# Patient Record
Sex: Female | Born: 1945 | Race: White | Hispanic: No | Smoking: Former smoker
Health system: Southern US, Community
[De-identification: ages and names within clinical notes are randomized; demographics above are authoritative.]

## PROBLEM LIST (undated history)

## (undated) DIAGNOSIS — M1990 Unspecified osteoarthritis, unspecified site: Secondary | ICD-10-CM

## (undated) DIAGNOSIS — E079 Disorder of thyroid, unspecified: Secondary | ICD-10-CM

## (undated) DIAGNOSIS — I82409 Acute embolism and thrombosis of unspecified deep veins of unspecified lower extremity: Secondary | ICD-10-CM

## (undated) DIAGNOSIS — E039 Hypothyroidism, unspecified: Secondary | ICD-10-CM

## (undated) DIAGNOSIS — I10 Essential (primary) hypertension: Secondary | ICD-10-CM

## (undated) DIAGNOSIS — J449 Chronic obstructive pulmonary disease, unspecified: Secondary | ICD-10-CM

## (undated) DIAGNOSIS — E7800 Pure hypercholesterolemia, unspecified: Secondary | ICD-10-CM

## (undated) DIAGNOSIS — K219 Gastro-esophageal reflux disease without esophagitis: Secondary | ICD-10-CM

## (undated) HISTORY — PX: VEIN SURGERY: SHX48

## (undated) HISTORY — PX: TONSILLECTOMY: SUR1361

## (undated) HISTORY — PX: BACK SURGERY: SHX140

## (undated) HISTORY — PX: TUBAL LIGATION: SHX77

---

## 2006-05-16 ENCOUNTER — Ambulatory Visit: Payer: Self-pay | Admitting: Cardiology

## 2008-12-20 ENCOUNTER — Ambulatory Visit: Payer: Self-pay | Admitting: Cardiology

## 2009-05-04 ENCOUNTER — Ambulatory Visit: Payer: Self-pay | Admitting: Cardiology

## 2011-12-04 DIAGNOSIS — A048 Other specified bacterial intestinal infections: Secondary | ICD-10-CM

## 2011-12-04 HISTORY — DX: Other specified bacterial intestinal infections: A04.8

## 2012-04-03 ENCOUNTER — Emergency Department (HOSPITAL_COMMUNITY): Payer: Medicare Other

## 2012-04-03 ENCOUNTER — Encounter (HOSPITAL_COMMUNITY): Payer: Self-pay | Admitting: Emergency Medicine

## 2012-04-03 ENCOUNTER — Inpatient Hospital Stay (HOSPITAL_COMMUNITY)
Admission: EM | Admit: 2012-04-03 | Discharge: 2012-04-05 | DRG: 440 | Disposition: A | Discharge status: Home / Self Care | Payer: Medicare Other | Attending: Internal Medicine | Admitting: Internal Medicine

## 2012-04-03 DIAGNOSIS — M129 Arthropathy, unspecified: Secondary | ICD-10-CM | POA: Diagnosis present

## 2012-04-03 DIAGNOSIS — E7800 Pure hypercholesterolemia, unspecified: Secondary | ICD-10-CM | POA: Diagnosis present

## 2012-04-03 DIAGNOSIS — J4489 Other specified chronic obstructive pulmonary disease: Secondary | ICD-10-CM | POA: Diagnosis present

## 2012-04-03 DIAGNOSIS — K219 Gastro-esophageal reflux disease without esophagitis: Secondary | ICD-10-CM | POA: Diagnosis present

## 2012-04-03 DIAGNOSIS — R1013 Epigastric pain: Secondary | ICD-10-CM | POA: Diagnosis present

## 2012-04-03 DIAGNOSIS — J449 Chronic obstructive pulmonary disease, unspecified: Secondary | ICD-10-CM | POA: Diagnosis present

## 2012-04-03 DIAGNOSIS — K2980 Duodenitis without bleeding: Secondary | ICD-10-CM | POA: Diagnosis present

## 2012-04-03 DIAGNOSIS — K2970 Gastritis, unspecified, without bleeding: Secondary | ICD-10-CM | POA: Diagnosis present

## 2012-04-03 DIAGNOSIS — I10 Essential (primary) hypertension: Secondary | ICD-10-CM

## 2012-04-03 DIAGNOSIS — K2990 Gastroduodenitis, unspecified, without bleeding: Secondary | ICD-10-CM | POA: Diagnosis present

## 2012-04-03 DIAGNOSIS — K859 Acute pancreatitis without necrosis or infection, unspecified: Principal | ICD-10-CM | POA: Diagnosis present

## 2012-04-03 DIAGNOSIS — E039 Hypothyroidism, unspecified: Secondary | ICD-10-CM | POA: Diagnosis present

## 2012-04-03 DIAGNOSIS — E119 Type 2 diabetes mellitus without complications: Secondary | ICD-10-CM | POA: Diagnosis present

## 2012-04-03 DIAGNOSIS — R112 Nausea with vomiting, unspecified: Secondary | ICD-10-CM | POA: Diagnosis present

## 2012-04-03 DIAGNOSIS — E782 Mixed hyperlipidemia: Secondary | ICD-10-CM | POA: Diagnosis present

## 2012-04-03 DIAGNOSIS — E785 Hyperlipidemia, unspecified: Secondary | ICD-10-CM

## 2012-04-03 DIAGNOSIS — F17200 Nicotine dependence, unspecified, uncomplicated: Secondary | ICD-10-CM | POA: Diagnosis present

## 2012-04-03 DIAGNOSIS — Z794 Long term (current) use of insulin: Secondary | ICD-10-CM

## 2012-04-03 HISTORY — DX: Pure hypercholesterolemia, unspecified: E78.00

## 2012-04-03 HISTORY — DX: Disorder of thyroid, unspecified: E07.9

## 2012-04-03 HISTORY — DX: Chronic obstructive pulmonary disease, unspecified: J44.9

## 2012-04-03 HISTORY — DX: Acute embolism and thrombosis of unspecified deep veins of unspecified lower extremity: I82.409

## 2012-04-03 HISTORY — DX: Essential (primary) hypertension: I10

## 2012-04-03 HISTORY — DX: Unspecified osteoarthritis, unspecified site: M19.90

## 2012-04-03 HISTORY — DX: Acute pancreatitis without necrosis or infection, unspecified: K85.90

## 2012-04-03 LAB — COMPREHENSIVE METABOLIC PANEL
ALT: 13 U/L (ref 0–35)
Alkaline Phosphatase: 61 U/L (ref 39–117)
BUN: 14 mg/dL (ref 6–23)
CO2: 23 mEq/L (ref 19–32)
Chloride: 100 mEq/L (ref 96–112)
GFR calc Af Amer: >90 mL/min (ref >90)
GFR calc non Af Amer: 88 mL/min — ABNORMAL LOW (ref >90)
Glucose, Bld: 116 mg/dL — ABNORMAL HIGH (ref 70–99)
Potassium: 3.6 mEq/L (ref 3.5–5.1)
Sodium: 137 mEq/L (ref 135–145)
Total Bilirubin: 0.3 mg/dL (ref 0.3–1.2)
Total Protein: 7.1 g/dL (ref 6.0–8.3)

## 2012-04-03 LAB — CBC
HCT: 39.2 % (ref 36.0–46.0)
Hemoglobin: 13.4 g/dL (ref 12.0–15.0)
MCH: 31 pg (ref 26.0–34.0)
MCHC: 34.2 g/dL (ref 30.0–36.0)
RDW: 12.2 % (ref 11.5–15.5)

## 2012-04-03 LAB — DIFFERENTIAL
Basophils Relative: 0 % (ref 0–1)
Eosinophils Relative: 0 % (ref 0–5)
Monocytes Absolute: 0.7 10*3/uL (ref 0.1–1.0)
Monocytes Relative: 9 % (ref 3–12)
Neutro Abs: 4.6 10*3/uL (ref 1.7–7.7)

## 2012-04-03 LAB — URINALYSIS, ROUTINE W REFLEX MICROSCOPIC
Bilirubin Urine: NEGATIVE
Ketones, ur: NEGATIVE mg/dL
Nitrite: NEGATIVE
Protein, ur: NEGATIVE mg/dL
pH: 6 (ref 5.0–8.0)

## 2012-04-03 LAB — TROPONIN I: Troponin I: <0.3 ng/mL (ref <0.30)

## 2012-04-03 MED ORDER — HYDROMORPHONE HCL PF 1 MG/ML IJ SOLN
1.0000 mg | INTRAMUSCULAR | Status: DC | PRN
  Administered 2012-04-04 (×2): 1 mg via INTRAVENOUS
  Filled 2012-04-03 (×2): qty 1

## 2012-04-03 MED ORDER — HYDROMORPHONE HCL PF 1 MG/ML IJ SOLN
1.0000 mg | Freq: Once | INTRAMUSCULAR | Status: AC
  Administered 2012-04-03: 1 mg via INTRAVENOUS

## 2012-04-03 MED ORDER — BIOTENE DRY MOUTH MT LIQD
15.0000 mL | Freq: Two times a day (BID) | OROMUCOSAL | Status: DC
  Administered 2012-04-03 – 2012-04-05 (×4): 15 mL via OROMUCOSAL

## 2012-04-03 MED ORDER — ACETAMINOPHEN 325 MG PO TABS: 650.0000 mg | ORAL_TABLET | Freq: Four times a day (QID) | ORAL | Status: DC | PRN

## 2012-04-03 MED ORDER — SENNOSIDES-DOCUSATE SODIUM 8.6-50 MG PO TABS: 1.0000 | ORAL_TABLET | Freq: Every evening | ORAL | Status: DC | PRN

## 2012-04-03 MED ORDER — ALPRAZOLAM 0.5 MG PO TABS
0.5000 mg | ORAL_TABLET | Freq: Two times a day (BID) | ORAL | Status: DC
  Administered 2012-04-03 – 2012-04-04 (×2): 0.5 mg via ORAL
  Filled 2012-04-03 (×2): qty 1

## 2012-04-03 MED ORDER — SODIUM CHLORIDE 0.9 % IV SOLN
INTRAVENOUS | Status: AC
  Administered 2012-04-03: 19:00:00 via INTRAVENOUS

## 2012-04-03 MED ORDER — LEVOTHYROXINE SODIUM 75 MCG PO TABS
150.0000 ug | ORAL_TABLET | Freq: Every day | ORAL | Status: DC
  Administered 2012-04-04 – 2012-04-05 (×2): 150 ug via ORAL
  Filled 2012-04-03 (×4): qty 1

## 2012-04-03 MED ORDER — INSULIN ASPART 100 UNIT/ML ~~LOC~~ SOLN
0.0000 [IU] | Freq: Three times a day (TID) | SUBCUTANEOUS | Status: DC
  Administered 2012-04-04 – 2012-04-05 (×2): 1 [IU] via SUBCUTANEOUS
  Administered 2012-04-05: 2 [IU] via SUBCUTANEOUS

## 2012-04-03 MED ORDER — ONDANSETRON HCL 4 MG/2ML IJ SOLN
4.0000 mg | Freq: Four times a day (QID) | INTRAMUSCULAR | Status: DC | PRN
  Administered 2012-04-04: 4 mg via INTRAVENOUS
  Filled 2012-04-03 (×2): qty 2

## 2012-04-03 MED ORDER — SODIUM CHLORIDE 0.9 % IV SOLN
INTRAVENOUS | Status: DC
  Administered 2012-04-03: 1000 mL via INTRAVENOUS

## 2012-04-03 MED ORDER — POTASSIUM CHLORIDE IN NACL 20-0.9 MEQ/L-% IV SOLN
INTRAVENOUS | Status: DC
  Administered 2012-04-03 – 2012-04-04 (×2): via INTRAVENOUS
  Administered 2012-04-04: 1000 mL via INTRAVENOUS
  Administered 2012-04-05: 12:00:00 via INTRAVENOUS

## 2012-04-03 MED ORDER — PNEUMOCOCCAL VAC POLYVALENT 25 MCG/0.5ML IJ INJ
0.5000 mL | INJECTION | INTRAMUSCULAR | Status: AC
  Administered 2012-04-04: 0.5 mL via INTRAMUSCULAR
  Filled 2012-04-03: qty 0.5

## 2012-04-03 MED ORDER — HYDROMORPHONE HCL PF 1 MG/ML IJ SOLN
1.0000 mg | Freq: Once | INTRAMUSCULAR | Status: DC
  Filled 2012-04-03: qty 1

## 2012-04-03 MED ORDER — ONDANSETRON HCL 4 MG/2ML IJ SOLN
4.0000 mg | Freq: Once | INTRAMUSCULAR | Status: AC
  Administered 2012-04-03: 4 mg via INTRAVENOUS
  Filled 2012-04-03: qty 2

## 2012-04-03 MED ORDER — ONDANSETRON HCL 4 MG/2ML IJ SOLN
4.0000 mg | Freq: Once | INTRAMUSCULAR | Status: DC
  Filled 2012-04-03: qty 2

## 2012-04-03 MED ORDER — ONDANSETRON HCL 4 MG PO TABS
4.0000 mg | ORAL_TABLET | Freq: Four times a day (QID) | ORAL | Status: DC | PRN
  Administered 2012-04-04: 4 mg via ORAL
  Filled 2012-04-03: qty 1

## 2012-04-03 MED ORDER — PANTOPRAZOLE SODIUM 40 MG IV SOLR
40.0000 mg | Freq: Every day | INTRAVENOUS | Status: DC
  Administered 2012-04-03: 40 mg via INTRAVENOUS
  Filled 2012-04-03: qty 40

## 2012-04-03 MED ORDER — ACETAMINOPHEN 650 MG RE SUPP: 650.0000 mg | Freq: Four times a day (QID) | RECTAL | Status: DC | PRN

## 2012-04-03 MED ORDER — DOCUSATE SODIUM 100 MG PO CAPS
100.0000 mg | ORAL_CAPSULE | Freq: Two times a day (BID) | ORAL | Status: DC
  Administered 2012-04-03 – 2012-04-05 (×4): 100 mg via ORAL
  Filled 2012-04-03 (×4): qty 1

## 2012-04-03 MED ORDER — ONDANSETRON HCL 4 MG/2ML IJ SOLN
4.0000 mg | Freq: Once | INTRAMUSCULAR | Status: AC
  Administered 2012-04-03: 4 mg via INTRAVENOUS

## 2012-04-03 MED ORDER — HYDROMORPHONE HCL PF 1 MG/ML IJ SOLN
1.0000 mg | Freq: Once | INTRAMUSCULAR | Status: AC
  Administered 2012-04-03: 1 mg via INTRAVENOUS
  Filled 2012-04-03: qty 1

## 2012-04-03 NOTE — ED Notes (Signed)
Pt c/o ruq pain with n/v x 1 week.

## 2012-04-03 NOTE — H&P (Signed)
Hospital Admission Note Date: 04/03/2012  Patient name: Gabrielle Wood Medical record number: [id number] Date of birth: 15-Sep-1946 Age: 66 y.o. Gender: female PCP: Tamela Oddi  Attending physician: Christiane Ha, MD  Chief Complaint: abdominal pain and nausea  History of Present Illness:  Gabrielle Wood is an 66 y.o. female who presents with worsening epigastric pain radiating to both sides of her rib cage and to the back. She has had intermittent and worsening nausea for several months now. She was diagnosed with diabetes 2 months ago and since then has had much vomiting. She has been tried on many diabetic medications, including metformin, Genevia, insulin, and others. She does not drink alcohol. She reports similar symptoms intermittently in the past but not this severe or prolonged. She has lost 15 or 20 pounds since her diagnosis of diabetes. She reports having had 2 CAT scans at [facility]. These results are unavailable. According to the ED physician, who spoke with her primary care physician, CAT scans showed nothing concerning. Apparently, she was scheduled to have a hida scan tomorrow. She reports a history of gastroesophageal reflux disease and takes Prilosec intermittently. She reports having had 3 EGDs for various GI complaints. She is seeing Dr. Dionicia Abler in the past and he has done endoscopy on 2 separate occasions. She's had no fevers. She feels cold chronically. No hematemesis. No diarrhea. In the ER, workup was significant for a mildly elevated lipase. Her liver function tests were normal. Her pain has been 10 out of 10 previously. After receiving medication in the ER, she feels better than she has in months, but is not pain-free at this time.  Past Medical History  Diagnosis Date  . Diabetes mellitus   . Hypertension   . Thyroid disease   . Arthritis   . COPD (chronic obstructive pulmonary disease)   . High cholesterol     Meds: Prescriptions prior to admission    Medication Sig Dispense Refill  . ALPRAZolam (XANAX) 0.5 MG tablet Take 0.5 mg by mouth 2 (two) times daily.      . Biotin 1000 MCG tablet Take 1,000 mcg by mouth daily.      . ciprofloxacin (CIPRO) 500 MG tablet Take 250 mg by mouth 2 (two) times daily.      [name] dimenhyDRINATE (DRAMAMINE) 50 MG tablet Take 50 mg by mouth every 4 (four) hours as needed. For nausea      . fish oil-omega-3 fatty acids 1000 MG capsule Take 1 g by mouth daily.      [name] HYDROcodone-acetaminophen (LORTAB) 10-500 MG per tablet Take 1 tablet by mouth every 6 (six) hours as needed. For pain      . ibuprofen (ADVIL,MOTRIN) 800 MG tablet Take 800 mg by mouth daily as needed. For pain      . insulin aspart (NOVOLOG FLEXPEN) 100 UNIT/ML injection Inject 10-20 Units into the skin 2 (two) times daily. Inject 20 units subcutaneously every day in the morning and 10 units every day in the evening      . levothyroxine (SYNTHROID, LEVOTHROID) 150 MCG tablet Take 150 mcg by mouth daily.      . metFORMIN (GLUCOPHAGE) 1000 MG tablet Take 1,000 mg by mouth 2 (two) times daily.      . Multiple Vitamin (MULITIVITAMIN WITH MINERALS) TABS Take 1 tablet by mouth daily.      . ondansetron (ZOFRAN) 4 MG tablet Take 4 mg by mouth every 8 (eight) hours as needed. For nausea      .  rosuvastatin (CRESTOR) 20 MG tablet Take 20 mg by mouth daily.        Allergies: Lipitor and Red yeast rice History   Social History  . Marital Status: Widowed    Spouse Name: N/A    Number of Children: N/A  . Years of Education: N/A   Occupational History  . Not on file.   Social History Main Topics  . Smoking status: Current Some Day Smoker  . Smokeless tobacco: Not on file  . Alcohol Use: No  . Drug Use: No  . Sexually Active:    Other Topics Concern  . Not on file   Social History Narrative  . No narrative on file   Family history: Mother died of pancreatic cancer. Niece recently died of colon cancer. Strokes diabetes hypertension  Past  Surgical History  Procedure Date  . Back surgery   . Tonsillectomy     Review of Systems: Systems reviewed and as per HPI, otherwise negative.  Physical Exam: Blood pressure 125/78, pulse 72, temperature 98.2 F (36.8 C), temperature source Oral, resp. rate 20, height 5\' 7"  (1.702 m), weight 83.553 kg (184 lb 3.2 oz), SpO2 96.00%. BP 110/68  Pulse 84  Temp(Src) 98.1 F (36.7 C) (Oral)  Resp 20  Ht 5\' 7"  (1.702 m)  Wt 83.553 kg (184 lb 3.2 oz)  BMI 28.85 kg/m2  SpO2 93%  General Appearance:    Alert, cooperative, no distress, appears stated age  Head:    Normocephalic, without obvious abnormality, atraumatic  Eyes:    PERRL, conjunctiva/corneas clear, EOM's intact, fundi    benign, both eyes     Nose:   Nares normal, septum midline, mucosa normal, no drainage    or sinus tenderness  Throat:   Lips, mucosa, and tongue normal; teeth and gums normal  Neck:   Supple, symmetrical, trachea midline, no adenopathy;    thyroid:  no enlargement/tenderness/nodules; no carotid   bruit or JVD  Back:     Symmetric, no curvature, ROM normal, no CVA tenderness  Lungs:     Clear to auscultation bilaterally, respirations unlabored  Chest Wall:    No tenderness or deformity   Heart:    Regular rate and rhythm, S1 and S2 normal, no murmur, rub   or gallop  Breast Exam:    No tenderness, masses, or nipple abnormality  Abdomen:     Soft, non-tender, bowel sounds active all four quadrants,    no masses, no organomegaly  Genitalia:    Normal female without lesion, discharge or tenderness  Rectal:    Normal tone, normal prostate, no masses or tenderness;   guaiac negative stool  Extremities:   Extremities normal, atraumatic, no cyanosis or edema  Pulses:   2+ and symmetric all extremities  Skin:   Skin color, texture, turgor normal, no rashes or lesions  Lymph nodes:   Cervical, supraclavicular, and axillary nodes normal  Neurologic:   CNII-XII intact, normal strength, sensation and reflexes     throughout    Lab results: Basic Metabolic Panel:  Basename 04/03/12 1509  NA 137  K 3.6  CL 100  CO2 23  GLUCOSE 116*  BUN 14  CREATININE 0.71  CALCIUM 10.4  MG --  PHOS --   Liver Function Tests:  Basename 04/03/12 1509  AST 13  ALT 13  ALKPHOS 61  BILITOT 0.3  PROT 7.1  ALBUMIN 4.0    Basename 04/03/12 1509  LIPASE 121*  AMYLASE --   No results  found for this basename: AMMONIA:2 in the last 72 hours CBC:  Basename 04/03/12 1509  WBC 7.2  NEUTROABS 4.6  HGB 13.4  HCT 39.2  MCV 90.7  PLT 172   Cardiac Enzymes:  Basename 04/03/12 1530  CKTOTAL --  CKMB --  CKMBINDEX --  TROPONINI <0.30   BNP: No results found for this basename: PROBNP:3 in the last 72 hours D-Dimer: No results found for this basename: DDIMER:2 in the last 72 hours CBG:  Basename 04/03/12 2145  GLUCAP 170*   Hemoglobin A1C: No results found for this basename: HGBA1C in the last 72 hours Fasting Lipid Panel: No results found for this basename: CHOL,HDL,LDLCALC,TRIG,CHOLHDL,LDLDIRECT in the last 72 hours Thyroid Function Tests: No results found for this basename: TSH,T4TOTAL,FREET4,T3FREE,THYROIDAB in the last 72 hours Anemia Panel: No results found for this basename: VITAMINB12,FOLATE,FERRITIN,TIBC,IRON,RETICCTPCT in the last 72 hours Coagulation: No results found for this basename: LABPROT:2,INR:2 in the last 72 hours Urine Drug Screen: Drugs of Abuse  No results found for this basename: labopia, cocainscrnur, labbenz, amphetmu, thcu, labbarb    Alcohol Level: No results found for this basename: ETH:2 in the last 72 hours Urinalysis:  Basename 04/03/12 2144  COLORURINE YELLOW  LABSPEC 1.010  PHURINE 6.0  GLUCOSEU NEGATIVE  HGBUR NEGATIVE  BILIRUBINUR NEGATIVE  KETONESUR NEGATIVE  PROTEINUR NEGATIVE  UROBILINOGEN 0.2  NITRITE NEGATIVE  LEUKOCYTESUR NEGATIVE    Imaging results:  US Abdomen Complete  04/03/2012  *RADIOLOGY REPORT*  Clinical Data:  Pancreatitis,  right upper quadrant abdominal pain  COMPLETE ABDOMINAL ULTRASOUND  Comparison:  03/03/2012; CT abdomen pelvis - 03/31/2012  Findings:  Gallbladder:  Sonographically normal.  The gallbladder wall thickening.  No echogenic gallstones.  No pericholecystic fluid. Negative sonographic Murphy's sign.  Common bile duct:  Normal in size measuring 6.5 mm in diameter.  Liver:  Homogeneous hepatic and texture.  No discrete hepatic lesions.  No definite intrahepatic biliary ductal dilatation.  No ascites.  IVC:  Appears normal.  Pancreas:  Limited visualization of the pancreatic head, neck and proximal body is normal.  The pancreatic body and tail is obscured by bowel gas. No definite pancreatic ductal dilatation.  Spleen:  Normal, measuring 7.9 mm in length.  Right Kidney:  Normal cortical thickness, echogenicity and size, measuring 10.6 cm in length.  Unchanged approximately 2.7 x 2.9 x 2.6 cm partially exophytic anechoic cyst arising from the superior pole right kidney.  Known left-sided renal stone is not well suspected on this examination.  No urinary obstruction.  Left Kidney:  Normal cortical thickness, echogenicity and size, measuring 11.2 cm in length.  No focal renal lesions.  No echogenic renal stones.  No urinary obstruction.  Abdominal aorta:  No aneurysm identified, though note the distal aspect of the abdominal aorta is obscured by bowel gas.  IMPRESSION:  1.  No explanation for patient's right upper quadrant abdominal pain.  Specifically, no evidence of cholelithiasis. Limited visualization of the pancreatic head, neck and proximal body is normal.  2.  Known left-sided nonobstructing renal stone is not well depicted on this examination.  No urinary obstruction.  3. Unchanged right-sided partially exophytic renal cyst.  Original Report Authenticated By: Waynard Reeds, M.D.    Assessment & Plan: Principal Problem:  *Pancreatitis, acute: Patient's symptoms have been prolonged, and she may have had a  smoldering pancreatitis for some time. With her recent diagnosis of diabetes, I wonder whether this was related to one of her medications. She denies alcohol use, her liver function  tests are unremarkable, and ultrasound shows no cholelithiasis. She is known to Dr. Dionicia Abler and I will consult him for further recommendations. I will asked her radiology reports from [facility]. She may need MRCP. Her symptoms are slightly improved. I will start her on clear liquid diet, IV fluids, pain medication, nausea medication. Hold most of her oral medications.   DM type 2 (diabetes mellitus, type 2): Will give sliding scale insulin.  Benign hypertension  Hyperlipidemia  Hypothyroidism  GERD (gastroesophageal reflux disease): IV Protonix   Royalty Fakhouri L 04/03/2012, 10:06 PM

## 2012-04-03 NOTE — ED Notes (Signed)
Tried to call report, Shanda Bumps RN to call me back.

## 2012-04-03 NOTE — ED Provider Notes (Addendum)
History     CSN: [id number]  Arrival date & time 04/03/12  1430   None     Chief Complaint  Patient presents with  . Emesis  . Abdominal Pain    (Consider location/radiation/quality/duration/timing/severity/associated sxs/prior treatment) HPI Complains of bilateral flank pain radiating to epigastric area and bilateral quadrants onset 7 or 8 days ago pain is constant , not affected by eating she has been treated with hydrocodone with partial relief symptoms accompanied by nausea patient vomited clear material 2 times this morning no fever pain is worse with walking no chest pain no shortness of breath no fever last bowel movement this morning normal. Patient evaluated by Dr.Kevia last week for same complaint had CT scan of abdomen which was normal. Presently symptoms are mild no other associated symptoms Past Medical History  Diagnosis Date  . Diabetes mellitus   . Hypertension   . Thyroid disease   . Arthritis   . COPD (chronic obstructive pulmonary disease)   . High cholesterol     Past Surgical History  Procedure Date  . Back surgery   . Tonsillectomy     No family history on file.  History  Substance Use Topics  . Smoking status: Current Some Day Smoker  . Smokeless tobacco: Not on file  . Alcohol Use: No    OB History    Grav Para Term Preterm Abortions TAB SAB Ect Mult Living                  Review of Systems  Constitutional: Negative.   HENT: Negative.   Respiratory: Negative.   Cardiovascular: Negative.   Gastrointestinal: Positive for nausea, vomiting and abdominal pain.  Genitourinary: Positive for flank pain.  Musculoskeletal: Negative.   Skin: Negative.   Neurological: Negative.   Hematological: Negative.   Psychiatric/Behavioral: Negative.   All other systems reviewed and are negative.    Allergies  Lipitor and Red yeast rice  Home Medications  No current outpatient prescriptions on file.  BP 143/76  Pulse 94  Temp 97.8 F (36.6 C)   Resp 18  Ht 5\' 7"  (1.702 m)  Wt 184 lb (83.462 kg)  BMI 28.82 kg/m2  SpO2 97%  Physical Exam  Nursing note and vitals reviewed. Constitutional: She appears well-developed and well-nourished.  HENT:  Head: Normocephalic and atraumatic.  Eyes: Conjunctivae are normal. Pupils are equal, round, and reactive to light.  Neck: Neck supple. No tracheal deviation present. No thyromegaly present.  Cardiovascular: Normal rate and regular rhythm.   No murmur heard. Pulmonary/Chest: Effort normal and breath sounds normal.  Abdominal: Soft. Bowel sounds are normal. She exhibits no distension and no mass. There is tenderness. There is no rebound and no guarding.       Obese Mild epigastric tenderness mild right upper quadrant tenderness and epigastric tenderness no Murphy sign  Genitourinary:       Mild bilateral flank tenderness  Musculoskeletal: Normal range of motion. She exhibits no edema and no tenderness.  Neurological: She is alert. Coordination normal.  Skin: Skin is warm and dry. No rash noted.  Psychiatric: She has a normal mood and affect.    ED Course  Procedures (including critical care time)  Labs Reviewed - No data to display No results found.   No diagnosis found.   Date: 04/03/2012  Rate: 85  Rhythm: normal sinus rhythm  QRS Axis: normal  Intervals: normal  ST/T Wave abnormalities: normal  Conduction Disutrbances: none  Narrative Interpretation: unremarkable  no  prior EKG for comparison Results for orders placed during the hospital encounter of 04/03/12  COMPREHENSIVE METABOLIC PANEL      Component Value Range   Sodium 137  135 - 145 (mEq/L)   Potassium 3.6  3.5 - 5.1 (mEq/L)   Chloride 100  96 - 112 (mEq/L)   CO2 23  19 - 32 (mEq/L)   Glucose, Bld 116 (*) 70 - 99 (mg/dL)   BUN 14  6 - 23 (mg/dL)   Creatinine, Ser 5.78  0.50 - 1.10 (mg/dL)   Calcium 46.9  8.4 - 10.5 (mg/dL)   Total Protein 7.1  6.0 - 8.3 (g/dL)   Albumin 4.0  3.5 - 5.2 (g/dL)   AST 13  0 -  37 (U/L)   ALT 13  0 - 35 (U/L)   Alkaline Phosphatase 61  39 - 117 (U/L)   Total Bilirubin 0.3  0.3 - 1.2 (mg/dL)   GFR calc non Af Amer 88 (*) >90 (mL/min)   GFR calc Af Amer >90  >90 (mL/min)  LIPASE, BLOOD      Component Value Range   Lipase 121 (*) 11 - 59 (U/L)  DIFFERENTIAL      Component Value Range   Neutrophils Relative 64  43 - 77 (%)   Neutro Abs 4.6  1.7 - 7.7 (K/uL)   Lymphocytes Relative 26  12 - 46 (%)   Lymphs Abs 1.9  0.7 - 4.0 (K/uL)   Monocytes Relative 9  3 - 12 (%)   Monocytes Absolute 0.7  0.1 - 1.0 (K/uL)   Eosinophils Relative 0  0 - 5 (%)   Eosinophils Absolute 0.0  0.0 - 0.7 (K/uL)   Basophils Relative 0  0 - 1 (%)   Basophils Absolute 0.0  0.0 - 0.1 (K/uL)  CBC      Component Value Range   WBC 7.2  4.0 - 10.5 (K/uL)   RBC 4.32  3.87 - 5.11 (MIL/uL)   Hemoglobin 13.4  12.0 - 15.0 (g/dL)   HCT 62.9  52.8 - 41.3 (%)   MCV 90.7  78.0 - 100.0 (fL)   MCH 31.0  26.0 - 34.0 (pg)   MCHC 34.2  30.0 - 36.0 (g/dL)   RDW 24.4  01.0 - 27.2 (%)   Platelets 172  150 - 400 (K/uL)  TROPONIN I      Component Value Range   Troponin I <0.30  <0.30 (ng/mL)   No results found.   Patient initially declined pain medication and antiemetics however 1640 p.m. requested pain medicine and antiemetic. Dilaudid and Zofran ordered  516 pm pain and nausea not improved after treatment with iv zofran and dialudid.  Dilaudid and zofran re-ordered.  MDM  Spoke with Dr.Kevia no further workup needed patient has HIDA scan scheduled for tomorrow as outpatient.  Spoke with Dr.Sullivan who will arrange for admission Plan bowel rest., Intravenous fluids, analgesia and antiemetics Admit medical surgical floor  Diagnosis acute pancreatitis        Doug Sou, MD 04/03/12 1710  6:08 PM feels improved after second dose of Diludid and  Zofran. Alert Glasgow Coma Score  Doug Sou, MD 04/03/12 [phone]

## 2012-04-04 ENCOUNTER — Encounter (HOSPITAL_COMMUNITY): Payer: Self-pay | Admitting: Gastroenterology

## 2012-04-04 ENCOUNTER — Encounter (HOSPITAL_COMMUNITY)
Admission: EM | Disposition: A | Discharge status: Home / Self Care | Payer: Self-pay | Source: Home / Self Care | Attending: Internal Medicine

## 2012-04-04 DIAGNOSIS — K859 Acute pancreatitis without necrosis or infection, unspecified: Secondary | ICD-10-CM

## 2012-04-04 DIAGNOSIS — E119 Type 2 diabetes mellitus without complications: Secondary | ICD-10-CM

## 2012-04-04 DIAGNOSIS — K2990 Gastroduodenitis, unspecified, without bleeding: Secondary | ICD-10-CM

## 2012-04-04 DIAGNOSIS — R748 Abnormal levels of other serum enzymes: Secondary | ICD-10-CM

## 2012-04-04 DIAGNOSIS — K2980 Duodenitis without bleeding: Secondary | ICD-10-CM

## 2012-04-04 DIAGNOSIS — E785 Hyperlipidemia, unspecified: Secondary | ICD-10-CM

## 2012-04-04 DIAGNOSIS — R109 Unspecified abdominal pain: Secondary | ICD-10-CM

## 2012-04-04 DIAGNOSIS — R1013 Epigastric pain: Secondary | ICD-10-CM

## 2012-04-04 DIAGNOSIS — I10 Essential (primary) hypertension: Secondary | ICD-10-CM

## 2012-04-04 DIAGNOSIS — K2970 Gastritis, unspecified, without bleeding: Secondary | ICD-10-CM

## 2012-04-04 DIAGNOSIS — R112 Nausea with vomiting, unspecified: Secondary | ICD-10-CM

## 2012-04-04 HISTORY — PX: ESOPHAGOGASTRODUODENOSCOPY: SHX5428

## 2012-04-04 LAB — COMPREHENSIVE METABOLIC PANEL
ALT: 9 U/L (ref 0–35)
Alkaline Phosphatase: 52 U/L (ref 39–117)
CO2: 24 mEq/L (ref 19–32)
Calcium: 9.1 mg/dL (ref 8.4–10.5)
Chloride: 106 mEq/L (ref 96–112)
GFR calc Af Amer: >90 mL/min (ref >90)
GFR calc non Af Amer: >90 mL/min (ref >90)
Glucose, Bld: 132 mg/dL — ABNORMAL HIGH (ref 70–99)
Potassium: 4 mEq/L (ref 3.5–5.1)
Sodium: 141 mEq/L (ref 135–145)
Total Bilirubin: 0.3 mg/dL (ref 0.3–1.2)

## 2012-04-04 LAB — GLUCOSE, CAPILLARY: Glucose-Capillary: 131 mg/dL — ABNORMAL HIGH (ref 70–99)

## 2012-04-04 LAB — TSH: TSH: 4.28 u[IU]/mL (ref 0.350–4.500)

## 2012-04-04 SURGERY — EGD (ESOPHAGOGASTRODUODENOSCOPY)
Anesthesia: Moderate Sedation

## 2012-04-04 MED ORDER — MEPERIDINE HCL 100 MG/ML IJ SOLN
INTRAMUSCULAR | Status: AC
  Filled 2012-04-04: qty 1

## 2012-04-04 MED ORDER — SODIUM CHLORIDE 0.9 % IV SOLN: Freq: Once | INTRAVENOUS | Status: DC

## 2012-04-04 MED ORDER — ALPRAZOLAM 0.5 MG PO TABS
0.5000 mg | ORAL_TABLET | Freq: Two times a day (BID) | ORAL | Status: DC | PRN
  Filled 2012-04-04: qty 1

## 2012-04-04 MED ORDER — PROMETHAZINE HCL 25 MG/ML IJ SOLN
INTRAMUSCULAR | Status: DC | PRN
  Administered 2012-04-04: 12.5 mg via INTRAVENOUS

## 2012-04-04 MED ORDER — STERILE WATER FOR IRRIGATION IR SOLN
Status: DC | PRN
  Administered 2012-04-04: 14:00:00

## 2012-04-04 MED ORDER — BUTAMBEN-TETRACAINE-BENZOCAINE 2-2-14 % EX AERO
INHALATION_SPRAY | CUTANEOUS | Status: DC | PRN
  Administered 2012-04-04: 2 via TOPICAL

## 2012-04-04 MED ORDER — SODIUM CHLORIDE 0.45 % IV SOLN
Freq: Once | INTRAVENOUS | Status: AC
  Administered 2012-04-04: 1000 mL via INTRAVENOUS

## 2012-04-04 MED ORDER — PANTOPRAZOLE SODIUM 40 MG IV SOLR
40.0000 mg | Freq: Two times a day (BID) | INTRAVENOUS | Status: DC
  Administered 2012-04-05: 40 mg via INTRAVENOUS
  Filled 2012-04-04: qty 40

## 2012-04-04 MED ORDER — ONDANSETRON HCL 4 MG/2ML IJ SOLN
4.0000 mg | Freq: Three times a day (TID) | INTRAMUSCULAR | Status: DC
  Administered 2012-04-05 (×2): 4 mg via INTRAVENOUS
  Filled 2012-04-04: qty 2

## 2012-04-04 MED ORDER — PROMETHAZINE HCL 25 MG/ML IJ SOLN
INTRAMUSCULAR | Status: AC
  Filled 2012-04-04: qty 1

## 2012-04-04 MED ORDER — MIDAZOLAM HCL 5 MG/5ML IJ SOLN
INTRAMUSCULAR | Status: AC
  Filled 2012-04-04: qty 5

## 2012-04-04 MED ORDER — MEPERIDINE HCL 100 MG/ML IJ SOLN
INTRAMUSCULAR | Status: DC | PRN
  Administered 2012-04-04: 25 mg via INTRAVENOUS
  Administered 2012-04-04: 50 mg via INTRAVENOUS

## 2012-04-04 MED ORDER — MIDAZOLAM HCL 5 MG/5ML IJ SOLN
INTRAMUSCULAR | Status: DC | PRN
  Administered 2012-04-04 (×2): 2 mg via INTRAVENOUS

## 2012-04-04 MED ORDER — SODIUM CHLORIDE 0.9 % IJ SOLN
INTRAMUSCULAR | Status: AC
  Filled 2012-04-04: qty 10

## 2012-04-04 NOTE — Consult Note (Signed)
REVIEWED. AGREE. 

## 2012-04-04 NOTE — Plan of Care (Signed)
REFERRAL FOR OP EUS WILL BE MADE ON MON MAY 6. PT SHOULD CALL OUR OFC: [phone] IF SHE HAS NOT HEARD FROM DR. OUTLAW BY MAY 10. OPV W/ SLF IN 6 WEEKS.

## 2012-04-04 NOTE — Consult Note (Signed)
Referring Provider: Christiane Ha, MD Primary Care Physician:  Tamela Oddi Primary Gastroenterologist:  Dr. Lionel December  Reason for Consultation:  Abdominal pain, vomiting, ?pancreatitis  HPI: Gabrielle Wood is a 66 y.o. female presented to emergency department yesterday with complaints of ongoing nausea/vomiting and abdominal pain. Patient tells me that she presented to [facility] because she's had multiple studies done at [facility] without etiology of her symptoms been determined. She reports that she has had a couple of noncontrast CT scans with no significant findings. She states that they were trying to get her insurance coming to agree to a contrasted study but they were having trouble getting it covered. She reports that she had an EGD by Dr. Erskine Speed about 5 years ago which was unremarkable. This was done for other reasons. She states she had 2 EGDs by Dr. Lionel December prior to that. Last colonoscopy about 5 years ago by Dr. Erskine Speed.    Patient c/o five months of intermittent nausea. She was diagnosed with DM few months ago and she thought her symptoms were related to that.  She has been tried on several diabetic medications and currently is on metformin and insulin. She's lost about 15 or 20 pounds since she was diagnosed with diabetes. Last 10 days, symptoms have been worse, started with vomiting. She has tried multiple agents for her nausea and vomiting including zofran/pepto/dramimine/phenergan. She decided to come to the emergency department yesterday because she has not been able to eat anything for a couple of days. Yesterday bad nausea and so much abdominal pain. Increased belching recently. Generally takes Prilosec twice weekly as needed. Symptoms have also been  associated with abdominal pain. Epigastric pain radiates around both flanks and into back. No melena, brbpr, constipation, diarrhea.   Patient reports h/o chronic ibuprofen 800mg  prn but none  in past couple of weeks due to GI issues.  Yesterday in the emergency department her lipase was elevated at 121. Her CBC, LFTs were normal. Abdominal ultrasound felt through showed explanation for patient's symptoms. No evidence of cholelithiasis.    Prior to Admission medications   Medication Sig Start Date End Date Taking? Authorizing Provider  ALPRAZolam Prudy Feeler) 0.5 MG tablet Take 0.5 mg by mouth 2 (two) times daily.   Yes Historical Provider, MD  Biotin 1000 MCG tablet Take 1,000 mcg by mouth daily.   Yes Historical Provider, MD  ciprofloxacin (CIPRO) 500 MG tablet Take 250 mg by mouth 2 (two) times daily.   Yes Historical Provider, MD  dimenhyDRINATE (DRAMAMINE) 50 MG tablet Take 50 mg by mouth every 4 (four) hours as needed. For nausea   Yes Historical Provider, MD  fish oil-omega-3 fatty acids 1000 MG capsule Take 1 g by mouth daily.   Yes Historical Provider, MD  HYDROcodone-acetaminophen (LORTAB) 10-500 MG per tablet Take 1 tablet by mouth every 6 (six) hours as needed. For pain   Yes Historical Provider, MD  ibuprofen (ADVIL,MOTRIN) 800 MG tablet Take 800 mg by mouth daily as needed. For pain   Yes Historical Provider, MD  insulin aspart (NOVOLOG FLEXPEN) 100 UNIT/ML injection Inject 10-20 Units into the skin 2 (two) times daily. Inject 20 units subcutaneously every day in the morning and 10 units every day in the evening   Yes Historical Provider, MD  levothyroxine (SYNTHROID, LEVOTHROID) 150 MCG tablet Take 150 mcg by mouth daily.   Yes Historical Provider, MD  metFORMIN (GLUCOPHAGE) 1000 MG tablet Take 1,000 mg by mouth 2 (two)  times daily.   Yes Historical Provider, MD  Multiple Vitamin (MULITIVITAMIN WITH MINERALS) TABS Take 1 tablet by mouth daily.   Yes Historical Provider, MD  ondansetron (ZOFRAN) 4 MG tablet Take 4 mg by mouth every 8 (eight) hours as needed. For nausea   Yes Historical Provider, MD  rosuvastatin (CRESTOR) 20 MG tablet Take 20 mg by mouth daily.   Yes Historical  Provider, MD    Current Facility-Administered Medications  Medication Dose Route Frequency Provider Last Rate Last Dose  . 0.9 %  sodium chloride infusion   Intravenous STAT Doug Sou, MD 125 mL/hr at 04/03/12 1839    . 0.9 % NaCl with KCl 20 mEq/ L  infusion   Intravenous Continuous Christiane Ha, MD 125 mL/hr at 04/04/12 0413    . acetaminophen (TYLENOL) tablet 650 mg  650 mg Oral Q6H PRN Christiane Ha, MD       Or  . acetaminophen (TYLENOL) suppository 650 mg  650 mg Rectal Q6H PRN Christiane Ha, MD      . ALPRAZolam Prudy Feeler) tablet 0.5 mg  0.5 mg Oral BID Christiane Ha, MD   0.5 mg at 04/03/12 2123  . antiseptic oral rinse (BIOTENE) solution 15 mL  15 mL Mouth Rinse BID Christiane Ha, MD   15 mL at 04/04/12 0800  . docusate sodium (COLACE) capsule 100 mg  100 mg Oral BID Christiane Ha, MD   100 mg at 04/03/12 2123  . HYDROmorphone (DILAUDID) injection 1 mg  1 mg Intravenous Once Doug Sou, MD   1 mg at 04/03/12 1700  . HYDROmorphone (DILAUDID) injection 1 mg  1 mg Intravenous Once Doug Sou, MD   1 mg at 04/03/12 1722  . HYDROmorphone (DILAUDID) injection 1 mg  1 mg Intravenous Q3H PRN Christiane Ha, MD   1 mg at 04/04/12 0735  . insulin aspart (novoLOG) injection 0-9 Units  0-9 Units Subcutaneous TID WC Christiane Ha, MD      . levothyroxine (SYNTHROID, LEVOTHROID) tablet 150 mcg  150 mcg Oral QAC breakfast Christiane Ha, MD   150 mcg at 04/04/12 0735  . ondansetron (ZOFRAN) injection 4 mg  4 mg Intravenous Once Doug Sou, MD   4 mg at 04/03/12 1700  . ondansetron (ZOFRAN) injection 4 mg  4 mg Intravenous Once Doug Sou, MD   4 mg at 04/03/12 1722  . ondansetron (ZOFRAN) tablet 4 mg  4 mg Oral Q6H PRN Christiane Ha, MD       Or  . ondansetron [facility]) injection 4 mg  4 mg Intravenous Q6H PRN Christiane Ha, MD   4 mg at 04/04/12 0735  . pantoprazole (PROTONIX) injection 40 mg  40 mg Intravenous QHS Christiane Ha, MD   40 mg at 04/03/12 2219  . pneumococcal 23 valent vaccine (PNU-IMMUNE) injection 0.5 mL  0.5 mL Intramuscular Tomorrow-1000 Christiane Ha, MD      . senna-docusate (Senokot-S) tablet 1 tablet  1 tablet Oral QHS PRN Christiane Ha, MD      . DISCONTD: 0.9 %  sodium chloride infusion   Intravenous Continuous Doug Sou, MD 125 mL/hr at 04/03/12 1700 1,000 mL at 04/03/12 1700  . DISCONTD: HYDROmorphone (DILAUDID) injection 1 mg  1 mg Intravenous Once Doug Sou, MD      . DISCONTD: ondansetron (ZOFRAN) injection 4 mg  4 mg Intravenous Once Doug Sou, MD        Allergies as  of 04/03/2012 - Review Complete 04/03/2012  Allergen Reaction Noted  . Lipitor (atorvastatin) Other (See Comments) 04/03/2012  . Red yeast rice (cholestin) Itching and Rash 04/03/2012    Past Medical History  Diagnosis Date  . Diabetes mellitus   . Hypertension   . Thyroid disease   . Arthritis   . COPD (chronic obstructive pulmonary disease)   . High cholesterol   . DVT (deep venous thrombosis)     years ago    Past Surgical History  Procedure Date  . Back surgery   . Tonsillectomy   . Vein surgery     stripping years ago but recent laser surgery  . Tubal ligation     Family History  Problem Relation Age of Onset  . Colon cancer Other     niece, age 37 deceased  . Pancreatic cancer Mother     diagnosed at age 76s, died from heart attack  . Inflammatory bowel disease Brother     crohn's, his dgt deceased from colon cancer at age 40    History   Social History  . Marital Status: Widowed    Spouse Name: N/A    Number of Children: 2  . Years of Education: N/A   Occupational History  . retired, Advertising copywriter at [employer]    Social History Main Topics  . Smoking status: Current Some Day Smoker  . Smokeless tobacco: Not on file  . Alcohol Use: No  . Drug Use: No  . Sexually Active:    Other Topics Concern  . Not on file   Social History Narrative  . No narrative  on file     ROS:  General: See HPI. Yesterday very weak. No fever, chills. Eyes: Negative for vision changes.  ENT: Negative for hoarseness, difficulty swallowing , nasal congestion. CV: Negative for chest pain, angina, palpitations, dyspnea on exertion, peripheral edema.  Respiratory: Negative for dyspnea at rest, dyspnea on exertion, cough, sputum, wheezing.  GI: See history of present illness. GU:  Negative for dysuria, hematuria, urinary incontinence, urinary frequency, nocturnal urination.  MS: Chronic arthritic pain. Derm: Negative for rash or itching.  Neuro: Negative for weakness, abnormal sensation, seizure, frequent headaches, memory loss, confusion.  Psych: Negative for anxiety, depression, suicidal ideation, hallucinations.  Endo: 20 lb weight loss since dx with DM. Heme: Negative for bruising or bleeding. Allergy: Negative for rash or hives.       Physical Examination: Vital signs in last 24 hours: Temp:  [97.8 F (36.6 C)-98.2 F (36.8 C)] 98.1 F (36.7 C) (05/03 0530) Pulse Rate:  [67-94] 84  (05/03 0530) Resp:  [18-20] 20  (05/03 0530) BP: (110-143)/(68-78) 110/68 mmHg (05/03 0530) SpO2:  [93 %-97 %] 93 % (05/03 0530) Weight:  [184 lb (83.462 kg)-184 lb 3.2 oz (83.553 kg)] 184 lb 3.2 oz (83.553 kg) (05/02 1837) Last BM Date: 04/03/12  General: Well-nourished, well-developed in no acute distress.  Head: Normocephalic, atraumatic.   Eyes: Conjunctiva pink, no icterus. Mouth: Oropharyngeal mucosa moist and pink , no lesions erythema or exudate. Neck: Supple without thyromegaly, masses, or lymphadenopathy.  Lungs: Clear to auscultation bilaterally.  Heart: Regular rate and rhythm, no murmurs rubs or gallops.  Abdomen: Bowel sounds are normal, Moderate epigastric tenderness, mild RUQ/LUQ tenderness, nondistended, no hepatosplenomegaly or masses, no abdominal bruits or    hernia , no rebound or guarding.   Rectal: Not performed. Extremities: No lower extremity  edema, clubbing, deformity.  Neuro: Alert and oriented x 4 , grossly normal neurologically.  Skin: Warm and dry, no rash or jaundice.   Psych: Alert and cooperative, normal mood and affect.        Intake/Output from previous day: 05/02 0701 - 05/03 0700 In: 1676.3 [P.O.:360; I.V.:1306.3; IV Piggyback:10] Out: 400 [Urine:400] Intake/Output this shift: Total I/O In: 495 [P.O.:495] Out: -   Lab Results: CBC  Basename 04/03/12 1509  WBC 7.2  HGB 13.4  HCT 39.2  MCV 90.7  PLT 172   BMET  Basename 04/04/12 0538 04/03/12 1509  NA 141 137  K 4.0 3.6  CL 106 100  CO2 24 23  GLUCOSE 132* 116*  BUN 10 14  CREATININE 0.66 0.71  CALCIUM 9.1 10.4   LFT  Basename 04/04/12 0538 04/03/12 1509  BILITOT 0.3 0.3  BILIDIR -- --  IBILI -- --  ALKPHOS 52 61  AST 12 13  ALT 9 13  PROT 6.1 7.1  ALBUMIN 3.4* 4.0   Lipase 121 --> 83    Imaging Studies: US Abdomen Complete  04/03/2012  *RADIOLOGY REPORT*  Clinical Data:  Pancreatitis, right upper quadrant abdominal pain  COMPLETE ABDOMINAL ULTRASOUND  Comparison:  03/03/2012; CT abdomen pelvis - 03/31/2012  Findings:  Gallbladder:  Sonographically normal.  The gallbladder wall thickening.  No echogenic gallstones.  No pericholecystic fluid. Negative sonographic Murphy's sign.  Common bile duct:  Normal in size measuring 6.5 mm in diameter.  Liver:  Homogeneous hepatic and texture.  No discrete hepatic lesions.  No definite intrahepatic biliary ductal dilatation.  No ascites.  IVC:  Appears normal.  Pancreas:  Limited visualization of the pancreatic head, neck and proximal body is normal.  The pancreatic body and tail is obscured by bowel gas. No definite pancreatic ductal dilatation.  Spleen:  Normal, measuring 7.9 mm in length.  Right Kidney:  Normal cortical thickness, echogenicity and size, measuring 10.6 cm in length.  Unchanged approximately 2.7 x 2.9 x 2.6 cm partially exophytic anechoic cyst arising from the superior pole right kidney.   Known left-sided renal stone is not well suspected on this examination.  No urinary obstruction.  Left Kidney:  Normal cortical thickness, echogenicity and size, measuring 11.2 cm in length.  No focal renal lesions.  No echogenic renal stones.  No urinary obstruction.  Abdominal aorta:  No aneurysm identified, though note the distal aspect of the abdominal aorta is obscured by bowel gas.  IMPRESSION:  1.  No explanation for patient's right upper quadrant abdominal pain.  Specifically, no evidence of cholelithiasis. Limited visualization of the pancreatic head, neck and proximal body is normal.  2.  Known left-sided nonobstructing renal stone is not well depicted on this examination.  No urinary obstruction.  3. Unchanged right-sided partially exophytic renal cyst.  Original Report Authenticated By: Waynard Reeds, M.D.  Pierre.Alas week]   Impression: 66 y/o female with several month h/o ongoing N/V, epigastric pain radiating into both upper quadrants and back. Denies typical heartburn on regular basis. Previously on significant amounts of ibuprofen. Lipase minimally elevated. Need to consider gastritis, PUD. Cannot rule out smoldering pancreatitis but minimally elevated lipase nonspecific. Per report from Dr. Lendell Caprice, her PCP reported negative CT but may have been noncontrast based on patient's description.   She has had prior radiological work-up at [facility]. Have requested records. Last EGD per patient over 5 years ago by Dr. Gabriel Cirri. Prior to that, by Dr. Karilyn Cota. Have requested those records as well.   Plan: 1. EGD today. 2. PPI daily. 3. Await records.  I would  like to thank Dr. Lendell Caprice for allowing [place] to take part in the care of this nice patient.    LOS: 1 day   Tana Coast  04/04/2012, 9:27 AM

## 2012-04-04 NOTE — H&P (Addendum)
Primary Care Physician:  No primary provider on file. Primary Gastroenterologist:  Dr. Darrick Penna  Pre-Procedure History & Physical: HPI:  Gabrielle Wood is a 66 y.o. female here for NAUSEA/VOMITING,/EPIGASTRIC PAIN.  Past Medical History  Diagnosis Date  . Diabetes mellitus   . Hypertension   . Thyroid disease   . Arthritis   . COPD (chronic obstructive pulmonary disease)   . High cholesterol   . DVT (deep venous thrombosis)     years ago    Past Surgical History  Procedure Date  . Back surgery   . Tonsillectomy   . Vein surgery     stripping years ago but recent laser surgery  . Tubal ligation     Prior to Admission medications   Medication Sig Start Date End Date Taking? Authorizing Provider  ALPRAZolam Prudy Feeler) 0.5 MG tablet Take 0.5 mg by mouth 2 (two) times daily.   Yes Historical Provider, MD  Biotin 1000 MCG tablet Take 1,000 mcg by mouth daily.   Yes Historical Provider, MD  ciprofloxacin (CIPRO) 500 MG tablet Take 250 mg by mouth 2 (two) times daily.   Yes Historical Provider, MD  dimenhyDRINATE (DRAMAMINE) 50 MG tablet Take 50 mg by mouth every 4 (four) hours as needed. For nausea   Yes Historical Provider, MD  fish oil-omega-3 fatty acids 1000 MG capsule Take 1 g by mouth daily.   Yes Historical Provider, MD  HYDROcodone-acetaminophen (LORTAB) 10-500 MG per tablet Take 1 tablet by mouth every 6 (six) hours as needed. For pain   Yes Historical Provider, MD  ibuprofen (ADVIL,MOTRIN) 800 MG tablet Take 800 mg by mouth daily as needed. For pain   Yes Historical Provider, MD  insulin aspart (NOVOLOG FLEXPEN) 100 UNIT/ML injection Inject 10-20 Units into the skin 2 (two) times daily. Inject 20 units subcutaneously every day in the morning and 10 units every day in the evening   Yes Historical Provider, MD  levothyroxine (SYNTHROID, LEVOTHROID) 150 MCG tablet Take 150 mcg by mouth daily.   Yes Historical Provider, MD  metFORMIN (GLUCOPHAGE) 1000 MG tablet Take 1,000 mg by  mouth 2 (two) times daily.   Yes Historical Provider, MD  Multiple Vitamin (MULITIVITAMIN WITH MINERALS) TABS Take 1 tablet by mouth daily.   Yes Historical Provider, MD  ondansetron (ZOFRAN) 4 MG tablet Take 4 mg by mouth every 8 (eight) hours as needed. For nausea   Yes Historical Provider, MD  rosuvastatin (CRESTOR) 20 MG tablet Take 20 mg by mouth daily.   Yes Historical Provider, MD    Allergies as of 04/03/2012 - Review Complete 04/03/2012  Allergen Reaction Noted  . Lipitor (atorvastatin) Other (See Comments) 04/03/2012  . Red yeast rice (cholestin) Itching and Rash 04/03/2012    Family History  Problem Relation Age of Onset  . Colon cancer Other     niece, age 59 deceased  . Pancreatic cancer Mother     diagnosed at age 90s, died from heart attack  . Inflammatory bowel disease Brother     crohn's, his dgt deceased from colon cancer at age 55    History   Social History  . Marital Status: Widowed    Spouse Name: N/A    Number of Children: 2  . Years of Education: N/A   Occupational History  . retired, Advertising copywriter at [employer]    Social History Main Topics  . Smoking status: Current Some Day Smoker  . Smokeless tobacco: Not on file  . Alcohol Use: No  .  Drug Use: No  . Sexually Active:    Other Topics Concern  . Not on file   Social History Narrative  . No narrative on file    Review of Systems: See HPI, otherwise negative ROS   Physical Exam: BP 165/98  Pulse 66  Temp(Src) 97.6 F (36.4 C) (Oral)  Resp 16  Ht 5\' 7"  (1.702 m)  Wt 184 lb (83.462 kg)  BMI 28.82 kg/m2  SpO2 97% General:   Alert,  pleasant and cooperative in NAD Head:  Normocephalic and atraumatic. Neck:  Supple;  Lungs:  Clear throughout to auscultation.    Heart:  Regular rate and rhythm. Abdomen:  Soft, MILD TTP IN BUQ/EPIGASTRIUM and nondistended. Normal bowel sounds, without guarding, and without rebound.   Neurologic:  Alert and  oriented x4;  grossly normal  neurologically.  Impression/Plan:    NAUSEA/VOMTIING/ABDOMINAL PAIN-NO DYSPHAGIA  PLAN: EGD TODAY

## 2012-04-04 NOTE — Op Note (Addendum)
[facility] [address] [place], [place]  [address]  ENDOSCOPY PROCEDURE REPORT  PATIENT:  Gabrielle Wood, Gabrielle Wood  MR#:  [id number] BIRTHDATE:  10-04-1946, 65 yrs. old  GENDER:  female  ENDOSCOPIST:  Jonette Eva, MD Referred by:  Doreen Beam, M.D.  PROCEDURE DATE:  04/04/2012 PROCEDURE:  EGD with biopsy, [id number] ASA CLASS: INDICATIONS:  ABDOMINAL PAIN, NAUSEA LAST VOMITED YESTERDAY  ZOFRAN HELPS.  MEDICATIONS:   Demerol 75 mg IV, Versed 5 mg IV, promethazine (Phenergan) 12.5 mg IV TOPICAL ANESTHETIC:  Cetacaine Spray  DESCRIPTION OF PROCEDURE:     Physical exam was performed. Informed consent was obtained from the patient after explaining the benefits, risks, and alternatives to the procedure.  The patient was connected to the monitor and placed in the left lateral position.  Continuous oxygen was provided by nasal cannula and IV medicine administered through an indwelling cannula.  After administration of sedation, the patient's esophagus was intubated and the [id number] ([id number]) endoscope was advanced under direct visualization to the second portion of the duodenum.  The scope was removed slowly by carefully examining the color, texture, anatomy, and integrity of the mucosa on the way out.  The patient was recovered in endoscopy and discharged home in satisfactory condition. <<PROCEDUREIMAGES>>  Moderate gastritis was found & BIOPSIED VIA COLD FORCEPS. MODERATE Duodenitis was found in the duodenal bulb portion of the duodenum. NL ESOPHAGUS. NO BARRETT'S.  COMPLICATIONS:    None  ENDOSCOPIC IMPRESSION: 1) Moderate gastritis 2) MODERATE Duodenitis in the duodenal bulb duodenum  RECOMMENDATIONS: AWAIT BIOPSIES BID PPI FOR 3 MOS THEN DAILY FULL LIQUID DIET & ADVANCE TO LOW FAT/SOFT MECH DIET PT NEEDS OP EUS in 2-3 weeks TO EVALUATE HOP/BILE DUCT FOR MICROLITHIASIS with Dr. Dulce Sellar. OPV IN 1 MO W/ SLF  REPEAT EXAM:  No  ______________________________ Jonette Eva, MD  CC:  n. REVISED:  04/04/2012 04:25 PM eSIGNED:   Daril Warga at 04/04/2012 04:25 PM  Mila Palmer, [phone]

## 2012-04-04 NOTE — Progress Notes (Signed)
Discussed with Ms. Melvyn Neth, PA, and Dr. Darrick Penna.  Subjective: Pain and nausea a little better today.  Objective: Vital signs in last 24 hours: Filed Vitals:   04/04/12 1420 04/04/12 1425 04/04/12 1430 04/04/12 1544  BP: 168/81 146/67 141/61 132/85  Pulse: 76 71 69 62  Temp:    97.5 F (36.4 C)  TempSrc:    Oral  Resp: 21 19 14 19   Height:      Weight:      SpO2: 96% 99% 100% 93%   Weight change:   Intake/Output Summary (Last 24 hours) at 04/04/12 1553 Last data filed at 04/04/12 1245  Gross per 24 hour  Intake 2171.25 ml  Output    400 ml  Net 1771.25 ml   Physical Exam:  Exam unchanged from 04/03/2012  Lab Results: Basic Metabolic Panel:  Lab 04/04/12 4098 04/03/12 1509  NA 141 137  K 4.0 3.6  CL 106 100  CO2 24 23  GLUCOSE 132* 116*  BUN 10 14  CREATININE 0.66 0.71  CALCIUM 9.1 10.4  MG -- --  PHOS -- --   Liver Function Tests:  Lab 04/04/12 0538 04/03/12 1509  AST 12 13  ALT 9 13  ALKPHOS 52 61  BILITOT 0.3 0.3  PROT 6.1 7.1  ALBUMIN 3.4* 4.0    Lab 04/04/12 0538 04/03/12 1509  LIPASE 83* 121*  AMYLASE 111* --   No results found for this basename: AMMONIA:2 in the last 168 hours CBC:  Lab 04/03/12 1509  WBC 7.2  NEUTROABS 4.6  HGB 13.4  HCT 39.2  MCV 90.7  PLT 172   Cardiac Enzymes:  Lab 04/03/12 1530  CKTOTAL --  CKMB --  CKMBINDEX --  TROPONINI <0.30   BNP: No results found for this basename: PROBNP:3 in the last 168 hours D-Dimer: No results found for this basename: DDIMER:2 in the last 168 hours CBG:  Lab 04/04/12 1131 04/04/12 0737 04/03/12 2145  GLUCAP 123* 131* 170*   Hemoglobin A1C: No results found for this basename: HGBA1C in the last 168 hours Fasting Lipid Panel: No results found for this basename: CHOL,HDL,LDLCALC,TRIG,CHOLHDL,LDLDIRECT in the last 119 hours Thyroid Function Tests: No results found for this basename: TSH,T4TOTAL,FREET4,T3FREE,THYROIDAB in the last 168 hours Coagulation: No results found for  this basename: LABPROT:4,INR:4 in the last 168 hours Anemia Panel: No results found for this basename: VITAMINB12,FOLATE,FERRITIN,TIBC,IRON,RETICCTPCT in the last 168 hours Urine Drug Screen: Drugs of Abuse  No results found for this basename: labopia, cocainscrnur, labbenz, amphetmu, thcu, labbarb    Alcohol Level: No results found for this basename: ETH:2 in the last 168 hours Urinalysis:  Lab 04/03/12 2144  COLORURINE YELLOW  LABSPEC 1.010  PHURINE 6.0  GLUCOSEU NEGATIVE  HGBUR NEGATIVE  BILIRUBINUR NEGATIVE  KETONESUR NEGATIVE  PROTEINUR NEGATIVE  UROBILINOGEN 0.2  NITRITE NEGATIVE  LEUKOCYTESUR NEGATIVE    Micro Results: No results found for this or any previous visit (from the past 240 hour(s)). Studies/Results: US Abdomen Complete  04/03/2012  *RADIOLOGY REPORT*  Clinical Data:  Pancreatitis, right upper quadrant abdominal pain  COMPLETE ABDOMINAL ULTRASOUND  Comparison:  03/03/2012; CT abdomen pelvis - 03/31/2012  Findings:  Gallbladder:  Sonographically normal.  The gallbladder wall thickening.  No echogenic gallstones.  No pericholecystic fluid. Negative sonographic Murphy's sign.  Common bile duct:  Normal in size measuring 6.5 mm in diameter.  Liver:  Homogeneous hepatic and texture.  No discrete hepatic lesions.  No definite intrahepatic biliary ductal dilatation.  No ascites.  IVC:  Appears normal.  Pancreas:  Limited visualization of the pancreatic head, neck and proximal body is normal.  The pancreatic body and tail is obscured by bowel gas. No definite pancreatic ductal dilatation.  Spleen:  Normal, measuring 7.9 mm in length.  Right Kidney:  Normal cortical thickness, echogenicity and size, measuring 10.6 cm in length.  Unchanged approximately 2.7 x 2.9 x 2.6 cm partially exophytic anechoic cyst arising from the superior pole right kidney.  Known left-sided renal stone is not well suspected on this examination.  No urinary obstruction.  Left Kidney:  Normal cortical  thickness, echogenicity and size, measuring 11.2 cm in length.  No focal renal lesions.  No echogenic renal stones.  No urinary obstruction.  Abdominal aorta:  No aneurysm identified, though note the distal aspect of the abdominal aorta is obscured by bowel gas.  IMPRESSION:  1.  No explanation for patient's right upper quadrant abdominal pain.  Specifically, no evidence of cholelithiasis. Limited visualization of the pancreatic head, neck and proximal body is normal.  2.  Known left-sided nonobstructing renal stone is not well depicted on this examination.  No urinary obstruction.  3. Unchanged right-sided partially exophytic renal cyst.  Original Report Authenticated By: Waynard Reeds, M.D.   Scheduled Meds:   . sodium chloride   Intravenous Once  . sodium chloride   Intravenous STAT  . ALPRAZolam  0.5 mg Oral BID  . antiseptic oral rinse  15 mL Mouth Rinse BID  . docusate sodium  100 mg Oral BID  . HYDROmorphone  1 mg Intravenous Once  . HYDROmorphone  1 mg Intravenous Once  . insulin aspart  0-9 Units Subcutaneous TID WC  . levothyroxine  150 mcg Oral QAC breakfast  . meperidine      . midazolam      . ondansetron  4 mg Intravenous Once  . ondansetron  4 mg Intravenous Once  . ondansetron (ZOFRAN) IV  4 mg Intravenous TID AC  . pantoprazole (PROTONIX) IV  40 mg Intravenous BID AC  . pneumococcal 23 valent vaccine  0.5 mL Intramuscular Tomorrow-1000  . promethazine      . sodium chloride      . DISCONTD: sodium chloride   Intravenous Once  . DISCONTD: HYDROmorphone  1 mg Intravenous Once  . DISCONTD: ondansetron  4 mg Intravenous Once  . DISCONTD: pantoprazole (PROTONIX) IV  40 mg Intravenous QHS   Continuous Infusions:   . 0.9 % NaCl with KCl 20 mEq / L 1,000 mL (04/04/12 1531)  . DISCONTD: sodium chloride 1,000 mL (04/03/12 1700)   PRN Meds:.acetaminophen, acetaminophen, HYDROmorphone (DILAUDID) injection, ondansetron (ZOFRAN) IV, ondansetron, senna-docusate, DISCONTD:  butamben-tetracaine-benzocaine, DISCONTD: meperidine, DISCONTD: midazolam, DISCONTD: promethazine, DISCONTD: simethicone susp in sterile water 1000 mL irrigation Assessment/Plan: Principal Problem:  *Pancreatitis, acute Active Problems:  DM type 2 (diabetes mellitus, type 2)  Benign hypertension  Hyperlipidemia  Hypothyroidism  GERD (gastroesophageal reflux disease)  Appreciate Dr. Darrick Penna assistance. Patient is for endoscopy today.   LOS: 1 day   Layia Walla L 04/04/2012, 3:53 PM

## 2012-04-04 NOTE — Care Management Note (Unsigned)
    Page 1 of 1   04/04/2012     3:53:49 PM   CARE MANAGEMENT NOTE 04/04/2012  Patient:  SRITHA, CHAUNCEY   Account Number:  [id number]  Date Initiated:  04/04/2012  Documentation initiated by:  Rosemary Holms  Subjective/Objective Assessment:   pt admitted with N/V/, abdominal pain. Recently diagnosed with DM. Twice pt stated her PCP was Dr Tamela Oddi at [facility]. Pt lives at home alone     Action/Plan:   Plans to DC home. Will follow for DC needs   Anticipated DC Date:  04/07/2012   Anticipated DC Plan:  HOME/SELF CARE      DC Planning Services  CM consult      Choice offered to / List presented to:             Status of service:  In process, will continue to follow Medicare Important Message given?   (If response is "NO", the following Medicare IM given date fields will be blank) Date Medicare IM given:   Date Additional Medicare IM given:    Discharge Disposition:    Per UR Regulation:    If discussed at Long Length of Stay Meetings, dates discussed:    Comments:  04/04/12 1530 Roni Scow Leanord Hawking RN  BSN CM

## 2012-04-05 DIAGNOSIS — E785 Hyperlipidemia, unspecified: Secondary | ICD-10-CM

## 2012-04-05 DIAGNOSIS — K2990 Gastroduodenitis, unspecified, without bleeding: Secondary | ICD-10-CM

## 2012-04-05 DIAGNOSIS — E119 Type 2 diabetes mellitus without complications: Secondary | ICD-10-CM

## 2012-04-05 DIAGNOSIS — I10 Essential (primary) hypertension: Secondary | ICD-10-CM

## 2012-04-05 DIAGNOSIS — K859 Acute pancreatitis without necrosis or infection, unspecified: Secondary | ICD-10-CM

## 2012-04-05 HISTORY — DX: Gastroduodenitis, unspecified, without bleeding: K29.90

## 2012-04-05 LAB — GLUCOSE, CAPILLARY
Glucose-Capillary: 141 mg/dL — ABNORMAL HIGH (ref 70–99)
Glucose-Capillary: 176 mg/dL — ABNORMAL HIGH (ref 70–99)

## 2012-04-05 MED ORDER — INSULIN ASPART 100 UNIT/ML ~~LOC~~ SOLN: 10.0000 [IU] | Freq: Every day | SUBCUTANEOUS | Status: DC

## 2012-04-05 MED ORDER — HYDROCODONE-ACETAMINOPHEN 10-500 MG PO TABS: 1.0000 | ORAL_TABLET | Freq: Four times a day (QID) | ORAL | Status: DC | PRN

## 2012-04-05 MED ORDER — SODIUM CHLORIDE 0.9 % IJ SOLN
INTRAMUSCULAR | Status: AC
  Administered 2012-04-05: 10 mL
  Filled 2012-04-05: qty 3

## 2012-04-05 MED ORDER — SODIUM CHLORIDE 0.9 % IJ SOLN
INTRAMUSCULAR | Status: AC
  Filled 2012-04-05: qty 3

## 2012-04-05 MED ORDER — ONDANSETRON HCL 4 MG PO TABS: 4.0000 mg | ORAL_TABLET | Freq: Four times a day (QID) | ORAL | Status: DC | PRN

## 2012-04-05 MED ORDER — PANTOPRAZOLE SODIUM 40 MG PO TBEC: 40.0000 mg | DELAYED_RELEASE_TABLET | Freq: Two times a day (BID) | ORAL | Status: DC

## 2012-04-05 NOTE — Discharge Summary (Signed)
Physician Discharge Summary  Patient ID: Gabrielle Wood MRN: [id number] DOB/AGE: Jan 10, 1946 66 y.o.  Admit date: 04/03/2012 Discharge date: 04/05/2012  Discharge Diagnoses:  Principal Problem:  *Pancreatitis, acute Active Problems:  Gastritis and duodenitis  DM type 2 (diabetes mellitus, type 2)  Benign hypertension  Hyperlipidemia  Hypothyroidism  GERD (gastroesophageal reflux disease)  Tests pending at the time of discharge: Gastric biopsy  Medication List  As of 04/05/2012  1:22 PM   STOP taking these medications         ciprofloxacin 500 MG tablet      dimenhyDRINATE 50 MG tablet      fish oil-omega-3 fatty acids 1000 MG capsule      ibuprofen 800 MG tablet      metFORMIN 1000 MG tablet      mulitivitamin with minerals Tabs      rosuvastatin 20 MG tablet         TAKE these medications         ALPRAZolam 0.5 MG tablet   Commonly known as: XANAX   Take 0.5 mg by mouth 2 (two) times daily.      Biotin 1000 MCG tablet   Take 1,000 mcg by mouth daily.      HYDROcodone-acetaminophen 10-500 MG per tablet   Commonly known as: LORTAB   Take 1 tablet by mouth every 6 (six) hours as needed for pain. For pain      insulin aspart 100 UNIT/ML injection   Commonly known as: novoLOG   Inject 10 Units into the skin at bedtime. Inject 20 units subcutaneously every day in the morning and 10 units every day in the evening      levothyroxine 150 MCG tablet   Commonly known as: SYNTHROID, LEVOTHROID   Take 150 mcg by mouth daily.      ondansetron 4 MG tablet   Commonly known as: ZOFRAN   Take 1 tablet (4 mg total) by mouth every 6 (six) hours as needed for nausea. For nausea      pantoprazole 40 MG tablet   Commonly known as: PROTONIX   Take 1 tablet (40 mg total) by mouth 2 (two) times daily.            Discharge Orders    Future Orders Please Complete By Expires   Increase activity slowly      Discharge instructions      Comments:   Low fat, diabetic diet.    Plenty of fluids.  No non-steroidal antinflamatories. Call Dr. Darrick Penna' office [phone] if you have not been contacted by Dr. Dulce Sellar by 04/11/12 to arrange endoscopic ultrasound.      Follow-up Information    Follow up with Jonette Eva, MD in 6 weeks.   Contact information:   [address] [place] [address] [phone]       Follow up with Freddy Jaksch, MD in 2 weeks. (endoscopic ultrasound)    Contact information:   [address] [place] [address] [phone]          Disposition: Home  Discharged Condition: Stable  Consults: Treatment Team:  [place], MD  Labs:   Results for orders placed during the hospital encounter of 04/03/12 (from the past 48 hour(s))  COMPREHENSIVE METABOLIC PANEL     Status: Abnormal   Collection Time   04/03/12  3:09 PM      Component Value Range Comment   Sodium 137  135 - 145 (mEq/L)    Potassium 3.6  3.5 - 5.1 (mEq/L)    Chloride 100  96 - 112 (mEq/L)    CO2 23  19 - 32 (mEq/L)    Glucose, Bld 116 (*) 70 - 99 (mg/dL)    BUN 14  6 - 23 (mg/dL)    Creatinine, Ser 1.61  0.50 - 1.10 (mg/dL)    Calcium 09.6  8.4 - 10.5 (mg/dL)    Total Protein 7.1  6.0 - 8.3 (g/dL)    Albumin 4.0  3.5 - 5.2 (g/dL)    AST 13  0 - 37 (U/L)    ALT 13  0 - 35 (U/L)    Alkaline Phosphatase 61  39 - 117 (U/L)    Total Bilirubin 0.3  0.3 - 1.2 (mg/dL)    GFR calc non Af Amer 88 (*) >90 (mL/min)    GFR calc Af Amer >90  >90 (mL/min)   LIPASE, BLOOD     Status: Abnormal   Collection Time   04/03/12  3:09 PM      Component Value Range Comment   Lipase 121 (*) 11 - 59 (U/L)   DIFFERENTIAL     Status: Normal   Collection Time   04/03/12  3:09 PM      Component Value Range Comment   Neutrophils Relative 64  43 - 77 (%)    Neutro Abs 4.6  1.7 - 7.7 (K/uL)    Lymphocytes Relative 26  12 - 46 (%)    Lymphs Abs 1.9  0.7 - 4.0 (K/uL)    Monocytes Relative 9  3 - 12 (%)     Monocytes Absolute 0.7  0.1 - 1.0 (K/uL)    Eosinophils Relative 0  0 - 5 (%)    Eosinophils Absolute 0.0  0.0 - 0.7 (K/uL)    Basophils Relative 0  0 - 1 (%)    Basophils Absolute 0.0  0.0 - 0.1 (K/uL)   CBC     Status: Normal   Collection Time   04/03/12  3:09 PM      Component Value Range Comment   WBC 7.2  4.0 - 10.5 (K/uL)    RBC 4.32  3.87 - 5.11 (MIL/uL)    Hemoglobin 13.4  12.0 - 15.0 (g/dL)    HCT 04.5  40.9 - 81.1 (%)    MCV 90.7  78.0 - 100.0 (fL)    MCH 31.0  26.0 - 34.0 (pg)    MCHC 34.2  30.0 - 36.0 (g/dL)    RDW 91.4  78.2 - 95.6 (%)    Platelets 172  150 - 400 (K/uL)   TROPONIN I     Status: Normal   Collection Time   04/03/12  3:30 PM      Component Value Range Comment   Troponin I <0.30  <0.30 (ng/mL)   URINALYSIS, ROUTINE W REFLEX MICROSCOPIC     Status: Normal   Collection Time   04/03/12  9:44 PM      Component Value Range Comment   Color, Urine YELLOW  YELLOW     APPearance CLEAR  CLEAR     Specific Gravity, Urine 1.010  1.005 - 1.030     pH 6.0  5.0 - 8.0     Glucose, UA NEGATIVE  NEGATIVE (mg/dL)    Hgb urine dipstick NEGATIVE  NEGATIVE     Bilirubin Urine NEGATIVE  NEGATIVE     Ketones, ur NEGATIVE  NEGATIVE (mg/dL)  Protein, ur NEGATIVE  NEGATIVE (mg/dL)    Urobilinogen, UA 0.2  0.0 - 1.0 (mg/dL)    Nitrite NEGATIVE  NEGATIVE     Leukocytes, UA NEGATIVE  NEGATIVE  MICROSCOPIC NOT DONE ON URINES WITH NEGATIVE PROTEIN, BLOOD, LEUKOCYTES, NITRITE, OR GLUCOSE <1000 mg/dL.  GLUCOSE, CAPILLARY     Status: Abnormal   Collection Time   04/03/12  9:45 PM      Component Value Range Comment   Glucose-Capillary 170 (*) 70 - 99 (mg/dL)    Comment 1 Notify RN      Comment 2 Documented in Chart     COMPREHENSIVE METABOLIC PANEL     Status: Abnormal   Collection Time   04/04/12  5:38 AM      Component Value Range Comment   Sodium 141  135 - 145 (mEq/L)    Potassium 4.0  3.5 - 5.1 (mEq/L)    Chloride 106  96 - 112 (mEq/L)    CO2 24  19 - 32 (mEq/L)    Glucose,  Bld 132 (*) 70 - 99 (mg/dL)    BUN 10  6 - 23 (mg/dL)    Creatinine, Ser 3.08  0.50 - 1.10 (mg/dL)    Calcium 9.1  8.4 - 10.5 (mg/dL)    Total Protein 6.1  6.0 - 8.3 (g/dL)    Albumin 3.4 (*) 3.5 - 5.2 (g/dL)    AST 12  0 - 37 (U/L)    ALT 9  0 - 35 (U/L)    Alkaline Phosphatase 52  39 - 117 (U/L)    Total Bilirubin 0.3  0.3 - 1.2 (mg/dL)    GFR calc non Af Amer >90  >90 (mL/min)    GFR calc Af Amer >90  >90 (mL/min)   LIPASE, BLOOD     Status: Abnormal   Collection Time   04/04/12  5:38 AM      Component Value Range Comment   Lipase 83 (*) 11 - 59 (U/L)   AMYLASE     Status: Abnormal   Collection Time   04/04/12  5:38 AM      Component Value Range Comment   Amylase 111 (*) 0 - 105 (U/L)   TSH     Status: Normal   Collection Time   04/04/12  5:38 AM      Component Value Range Comment   TSH 4.280  0.350 - 4.500 (uIU/mL)   GLUCOSE, CAPILLARY     Status: Abnormal   Collection Time   04/04/12  7:37 AM      Component Value Range Comment   Glucose-Capillary 131 (*) 70 - 99 (mg/dL)   GLUCOSE, CAPILLARY     Status: Abnormal   Collection Time   04/04/12 11:31 AM      Component Value Range Comment   Glucose-Capillary 123 (*) 70 - 99 (mg/dL)   GLUCOSE, CAPILLARY     Status: Abnormal   Collection Time   04/04/12  4:40 PM      Component Value Range Comment   Glucose-Capillary 104 (*) 70 - 99 (mg/dL)   GLUCOSE, CAPILLARY     Status: Abnormal   Collection Time   04/04/12  9:43 PM      Component Value Range Comment   Glucose-Capillary 178 (*) 70 - 99 (mg/dL)    Comment 1 Notify RN     GLUCOSE, CAPILLARY     Status: Abnormal   Collection Time   04/05/12  7:22 AM  Component Value Range Comment   Glucose-Capillary 141 (*) 70 - 99 (mg/dL)   GLUCOSE, CAPILLARY     Status: Abnormal   Collection Time   04/05/12 11:58 AM      Component Value Range Comment   Glucose-Capillary 176 (*) 70 - 99 (mg/dL)     Diagnostics:  US Abdomen Complete  04/03/2012  *RADIOLOGY REPORT*  Clinical Data:   Pancreatitis, right upper quadrant abdominal pain  COMPLETE ABDOMINAL ULTRASOUND  Comparison:  03/03/2012; CT abdomen pelvis - 03/31/2012  Findings:  Gallbladder:  Sonographically normal.  The gallbladder wall thickening.  No echogenic gallstones.  No pericholecystic fluid. Negative sonographic Murphy's sign.  Common bile duct:  Normal in size measuring 6.5 mm in diameter.  Liver:  Homogeneous hepatic and texture.  No discrete hepatic lesions.  No definite intrahepatic biliary ductal dilatation.  No ascites.  IVC:  Appears normal.  Pancreas:  Limited visualization of the pancreatic head, neck and proximal body is normal.  The pancreatic body and tail is obscured by bowel gas. No definite pancreatic ductal dilatation.  Spleen:  Normal, measuring 7.9 mm in length.  Right Kidney:  Normal cortical thickness, echogenicity and size, measuring 10.6 cm in length.  Unchanged approximately 2.7 x 2.9 x 2.6 cm partially exophytic anechoic cyst arising from the superior pole right kidney.  Known left-sided renal stone is not well suspected on this examination.  No urinary obstruction.  Left Kidney:  Normal cortical thickness, echogenicity and size, measuring 11.2 cm in length.  No focal renal lesions.  No echogenic renal stones.  No urinary obstruction.  Abdominal aorta:  No aneurysm identified, though note the distal aspect of the abdominal aorta is obscured by bowel gas.  IMPRESSION:  1.  No explanation for patient's right upper quadrant abdominal pain.  Specifically, no evidence of cholelithiasis. Limited visualization of the pancreatic head, neck and proximal body is normal.  2.  Known left-sided nonobstructing renal stone is not well depicted on this examination.  No urinary obstruction.  3. Unchanged right-sided partially exophytic renal cyst.  Original Report Authenticated By: Waynard Reeds, M.D.    Procedures: EGD with biopsy  Hospital Course: See H&P for complete admission details. Gabrielle Wood is a 66 year old  white female who has had a several month history of intermittent vomiting, epigastric pain that started radiating to her flanks. She was diagnosed with type 2 diabetes within the past few months. She's been on multiple medications for diabetes. Initially, she thought her vomiting was related to the diabetes, or medications. She also has been on ciprofloxacin several times recently for urinary tract infection. She had blood work and a noncontrasted CAT scan at [facility] which showed no gallstones, cholecystitis, pancreatitis, obstruction. She had been on ibuprofen up until about a week or 2 ago. She has been on Zofran, Dramamine, Pepto-Bismol the symptoms have worsened. Therefore came to the emergency room. In the emergency room, she had normal liver function tests, normal CBC, normal penis, but a slightly elevated lipase at 121.  She was admitted to the hospitalist service. Abdominal ultrasound showed no cholelithiasis or cholecystitis. Pancreas was incompletely visualized. She had minimal epigastric tenderness. She denies alcohol use. No previous history of pancreatitis. GI was consulted and recommended EGD which showed gastritis and duodenitis. Gastric biopsies pending. Dr. Darrick Penna has recommended twice-daily Protonix for 3 months then once a day as well as endoscopic ultrasound as an outpatient. Problems will arrange this with Dr. Dulce Sellar. Patient should not resume nonsteroidal anti-inflammatories. I've  stopped her statin, multivitamin, metformin in case there contributing to her symptoms. She should also stop Cipro. Her repeat urinalysis is negative. She reports occasional hypoglycemia at home. As her intake has been variable over the past several weeks to months, I decreased her Lantus to 10 units subcutaneously nightly. By the time of discharge, her symptoms had improved. She is tolerating a solid diet. She still has pain but it is much improved and she's not required pain medication for about 18  hours. Total time on the day of discharge greater than 30 minutes.  Discharge Exam:  Blood pressure 132/79, pulse 79, temperature 98.3 F (36.8 C), temperature source Oral, resp. rate 19, height 5\' 7"  (1.702 m), weight 83.462 kg (184 lb), SpO2 95.00%.  General: Comfortable. Talkative. Lungs clear to auscultation bilaterally without wheeze rhonchi or rales Cardiovascular regular rate rhythm without murmurs gallops rubs Abdomen soft nontender nondistended Extremities no clubbing cyanosis or edema   Signed: Firmin Belisle L 04/05/2012, 1:22 PM

## 2012-04-05 NOTE — Progress Notes (Signed)
Patient given discharge instructions.  Family at bedside.  Patient educated regarding follow-up appointments and changes in new/current medications.  Patient was eager to learn.  Patient left with family at bedside and stated she will call when ready for a wheelchair to exit facility.

## 2012-04-07 ENCOUNTER — Telehealth: Payer: Self-pay | Admitting: Gastroenterology

## 2012-04-07 NOTE — Telephone Encounter (Signed)
Message copied by Irish Elders on Mon Apr 07, 2012  9:43 AM ------      Message from: [place]      Created: Fri Apr 04, 2012  2:55 PM       NEEDS EUS WITH DR. Dulce Sellar IN 2-3 WEEKS TO EVALUATE FOR MICROLITHIASIS/ HEAD OF PANCREAS

## 2012-04-07 NOTE — Telephone Encounter (Signed)
Spoke with Lurena Joiner at Dr. Hulen Shouts office- she requested I fax over notes before scheduling- Notes faxed to [phone]

## 2012-04-08 ENCOUNTER — Encounter (HOSPITAL_COMMUNITY): Payer: Self-pay | Admitting: Gastroenterology

## 2012-04-09 NOTE — Progress Notes (Signed)
UR Chart Review Completed  

## 2012-04-16 ENCOUNTER — Ambulatory Visit (HOSPITAL_COMMUNITY): Admission: RE | Admit: 2012-04-16 | Payer: Medicare Other | Source: Ambulatory Visit | Admitting: Gastroenterology

## 2012-04-16 ENCOUNTER — Encounter (HOSPITAL_COMMUNITY): Admission: RE | Payer: Self-pay | Source: Ambulatory Visit

## 2012-04-16 SURGERY — ULTRASOUND, UPPER GI TRACT, ENDOSCOPIC
Anesthesia: Monitor Anesthesia Care

## 2012-04-17 ENCOUNTER — Telehealth: Payer: Self-pay | Admitting: Gastroenterology

## 2012-04-17 NOTE — Telephone Encounter (Signed)
No PCP on file 

## 2012-04-17 NOTE — Telephone Encounter (Signed)
LMOM to call.

## 2012-04-17 NOTE — Telephone Encounter (Signed)
Pt is aware of OV on 6/19 at 10 with SF

## 2012-04-17 NOTE — Telephone Encounter (Signed)
PLEASE CALL PT. Her stomach Bx showed H. Pylori infection. She needs AMOXICILLIN 500 mg 2 po BID for 10 days and Biaxin 500 mg po bid for 10 days, #qs, rfx0. She needs TO CONTINUE PROTONIX BID. Med side effects include NVD, abd pain, and metallic taste. OPV IN 1 MO W/ SLF E 30 VISIT. ASK HER WHEN SHE IS SCHEDULED FOR HER ENDOSCOPY IN [place].

## 2012-04-18 ENCOUNTER — Telehealth: Payer: Self-pay | Admitting: Gastroenterology

## 2012-04-18 NOTE — Telephone Encounter (Signed)
REVIEWED.  

## 2012-04-18 NOTE — Telephone Encounter (Signed)
Joni Reining from Dr Hulen Shouts office called to let us know pt cancelled her procedure and stated she just had to much going on at this time

## 2012-04-21 ENCOUNTER — Telehealth: Payer: Self-pay

## 2012-04-21 NOTE — Telephone Encounter (Signed)
Pt called and got results of biopsies. Aware of H. Pylori. Rx's called to [facility] in [place] and [facility].  Pt said she had cancelled appt with Dr. Dulce Sellar but was going to call and reschedule. She had a lot going on was shy she cancelled.

## 2012-04-23 NOTE — Telephone Encounter (Signed)
REVIEWED.  

## 2012-05-09 ENCOUNTER — Encounter (HOSPITAL_COMMUNITY): Payer: Self-pay | Admitting: *Deleted

## 2012-05-14 ENCOUNTER — Ambulatory Visit (HOSPITAL_COMMUNITY): Payer: Medicare Other | Admitting: Anesthesiology

## 2012-05-14 ENCOUNTER — Encounter (HOSPITAL_COMMUNITY)
Admission: RE | Disposition: A | Discharge status: Home / Self Care | Payer: Self-pay | Source: Ambulatory Visit | Attending: Gastroenterology

## 2012-05-14 ENCOUNTER — Ambulatory Visit (HOSPITAL_COMMUNITY)
Admission: RE | Admit: 2012-05-14 | Discharge: 2012-05-14 | Disposition: A | Discharge status: Home / Self Care | Payer: Medicare Other | Source: Ambulatory Visit | Attending: Gastroenterology | Admitting: Gastroenterology

## 2012-05-14 ENCOUNTER — Encounter (HOSPITAL_COMMUNITY): Payer: Self-pay | Admitting: *Deleted

## 2012-05-14 ENCOUNTER — Encounter (HOSPITAL_COMMUNITY): Payer: Self-pay | Admitting: Anesthesiology

## 2012-05-14 DIAGNOSIS — N281 Cyst of kidney, acquired: Secondary | ICD-10-CM | POA: Insufficient documentation

## 2012-05-14 DIAGNOSIS — R1011 Right upper quadrant pain: Secondary | ICD-10-CM | POA: Insufficient documentation

## 2012-05-14 DIAGNOSIS — R112 Nausea with vomiting, unspecified: Secondary | ICD-10-CM | POA: Insufficient documentation

## 2012-05-14 HISTORY — PX: EUS: SHX5427

## 2012-05-14 HISTORY — DX: Hypothyroidism, unspecified: E03.9

## 2012-05-14 HISTORY — DX: Gastro-esophageal reflux disease without esophagitis: K21.9

## 2012-05-14 LAB — GLUCOSE, CAPILLARY: Glucose-Capillary: 169 mg/dL — ABNORMAL HIGH (ref 70–99)

## 2012-05-14 SURGERY — ESOPHAGEAL ENDOSCOPIC ULTRASOUND (EUS) RADIAL
Anesthesia: Monitor Anesthesia Care

## 2012-05-14 MED ORDER — SODIUM CHLORIDE 0.9 % IV SOLN
INTRAVENOUS | Status: DC | PRN
  Administered 2012-05-14: 10:00:00 via INTRAVENOUS

## 2012-05-14 MED ORDER — FENTANYL CITRATE 0.05 MG/ML IJ SOLN
INTRAMUSCULAR | Status: DC | PRN
  Administered 2012-05-14 (×2): 50 ug via INTRAVENOUS

## 2012-05-14 MED ORDER — BUTAMBEN-TETRACAINE-BENZOCAINE 2-2-14 % EX AERO
INHALATION_SPRAY | CUTANEOUS | Status: DC | PRN
  Administered 2012-05-14: 2 via TOPICAL

## 2012-05-14 MED ORDER — MIDAZOLAM HCL 5 MG/5ML IJ SOLN: INTRAMUSCULAR | Status: DC | PRN

## 2012-05-14 MED ORDER — MIDAZOLAM HCL 5 MG/5ML IJ SOLN
INTRAMUSCULAR | Status: DC | PRN
  Administered 2012-05-14: 1 mg via INTRAVENOUS

## 2012-05-14 MED ORDER — ONDANSETRON HCL 4 MG/2ML IJ SOLN
INTRAMUSCULAR | Status: DC | PRN
  Administered 2012-05-14 (×2): 2 mg via INTRAVENOUS

## 2012-05-14 MED ORDER — LACTATED RINGERS IV SOLN: INTRAVENOUS | Status: DC

## 2012-05-14 MED ORDER — KETAMINE HCL 10 MG/ML IJ SOLN: INTRAMUSCULAR | Status: DC | PRN

## 2012-05-14 MED ORDER — KETAMINE HCL 10 MG/ML IJ SOLN
INTRAMUSCULAR | Status: DC | PRN
  Administered 2012-05-14: 5 mg via INTRAVENOUS

## 2012-05-14 MED ORDER — PROPOFOL 10 MG/ML IV EMUL
INTRAVENOUS | Status: DC | PRN
  Administered 2012-05-14: 100 ug/kg/min via INTRAVENOUS

## 2012-05-14 NOTE — Anesthesia Preprocedure Evaluation (Addendum)
Anesthesia Evaluation  Patient identified by MRN, date of birth, ID band Patient awake    Reviewed: Allergy & Precautions, H&P , NPO status , Patient's Chart, lab work & pertinent test results  Airway       Dental   Pulmonary COPD         Cardiovascular hypertension,     Neuro/Psych negative neurological ROS  negative psych ROS   GI/Hepatic Neg liver ROS, GERD-  ,  Endo/Other  Diabetes mellitus-, Type 1, Insulin DependentHypothyroidism   Renal/GU negative Renal ROS  negative genitourinary   Musculoskeletal negative musculoskeletal ROS (+)   Abdominal   Peds negative pediatric ROS (+)  Hematology negative hematology ROS (+)   Anesthesia Other Findings   Reproductive/Obstetrics negative OB ROS                           Anesthesia Physical Anesthesia Plan  ASA: II  Anesthesia Plan: MAC   Post-op Pain Management:    Induction: Intravenous  Airway Management Planned: Nasal Cannula  Additional Equipment:   Intra-op Plan:   Post-operative Plan:   Informed Consent:   Dental advisory given  Plan Discussed with: CRNA  Anesthesia Plan Comments:         Anesthesia Quick Evaluation

## 2012-05-14 NOTE — H&P (Signed)
Patient interval history reviewed.  Patient examined again.  There has been no change from documented H/P dated 05/01/12 (scanned into chart from our office) except as documented above.  Assessment:   1.  Nausea, vomiting. 2.  Right upper quadrant pain. 3.  Symptoms spontaneously resolved few weeks ago, after > 3 months of symptoms. 4.  Extensive prior negative evaluation (EGD, CT, ultrasound, labs (including LFTs, amylase)).  Plan:  1.  Endoscopic ultrasound. 2.  Risks (bleeding, infection, bowel perforation that could require surgery, sedation-related changes in cardiopulmonary systems), benefits (identification and possible treatment of source of symptoms, exclusion of certain causes of symptoms), and alternatives (watchful waiting, radiographic imaging studies, empiric medical treatment) of upper endoscopy with ultrasound (EUS) were explained to patient in detail and she wishes to proceed.

## 2012-05-14 NOTE — Op Note (Signed)
[facility] [address] [place], [place]  [address]  ENDOSCOPIC ULTRASOUND PROCEDURE REPORT  PATIENT:  Gabrielle Wood, Gabrielle Wood  MR#:  [id number] BIRTHDATE:  09-May-1946  GENDER:  female  ENDOSCOPIST:  Willis Modena, MD REFERRED BY:  Bradly Bienenstock, M.D.  PROCEDURE DATE:  05/14/2012 PROCEDURE:  Upper EUS ASA CLASS:  Class II INDICATIONS:  right upper quadrant abdominal pain  MEDICATIONS:   Cetacaine spray x 2, MAC sedation, administered by CRNA  DESCRIPTION OF PROCEDURE:   After the risks benefits and alternatives of the procedure were  explained, informed consent was obtained. The patient was then placed in the left, lateral, decubitus postion and IV sedation was administered. Throughout the procedure, the patient's blood pressure, pulse and oxygen saturations were monitored continuously.  Under direct visualization, the PENTAX EUS SCOPE endoscope was introduced through the mouth and advanced to the second portion of the duodenum.  Water was used as necessary to provide an acoustic interface.  Upon completion of the imaging, water was removed and the patient was sent to the recovery room in satisfactory condition.  <<PROCEDUREIMAGES>>  FINDINGS:  Scattered mild hyperechoic strands/foci, seemingly age-related, without evidence of chronic pancreatitis. Normal-appearing gallbladder without wall thickening, pericholecystic fluid, microlithiasis, or stones/sludge. Diminutive bile duct without wall thickening or choledocholithiasis.  Ampulla normal via EUS.  Incidental notation made of simple-appearing right renal cystc   3 x 3 cm in size.  ENDOSCOPIC IMPRESSION:    1.  Normal pancreas, gallbladder, biliary tree. 2.  Incidental finding of simple-appearing right renal cyst.  RECOMMENDATIONS:      1.  Watch for potential complications of procedure. 2.  Will discuss with Dr. Darrick Penna. 3.  Patient's symptoms have resolved; will likely manage expectantly at this  point.  ______________________________ Willis Modena  CC:  n. eSIGNEDWillis Modena at 05/14/2012 11:10 AM  Mila Palmer, [id number]

## 2012-05-14 NOTE — Transfer of Care (Signed)
Immediate Anesthesia Transfer of Care Note  Patient: Gabrielle Wood  Procedure(s) Performed: Procedure(s) (LRB): ESOPHAGEAL ENDOSCOPIC ULTRASOUND (EUS) RADIAL (N/A)  Patient Location: PACU  Anesthesia Type: MAC  Level of Consciousness: awake and alert   Airway & Oxygen Therapy: Patient Spontanous Breathing and Patient connected to nasal cannula oxygen  Post-op Assessment: Report given to PACU RN and Post -op Vital signs reviewed and stable  Post vital signs: Reviewed and stable  Complications: No apparent anesthesia complications

## 2012-05-14 NOTE — Discharge Instructions (Addendum)
Endoscopic Ultrasound (EUS)  Care After  Please read the instructions outlined below and refer to this sheet in the next few weeks. These discharge instructions provide you with general information on caring for yourself after you leave the hospital. Your doctor may also give you specific instructions. While your treatment has been planned according to the most current medical practices available, unavoidable complications occasionally occur. If you have any problems or questions after discharge, please call Dr. Outlaw ([facility]) at [phone].  HOME CARE INSTRUCTIONS  Activity  You may resume your regular activity but move at a slower pace for the next 24 hours.   Take frequent rest periods for the next 24 hours.   Walking will help expel (get rid of) the air and reduce the bloated feeling in your abdomen.   No driving for 24 hours (because of the anesthesia (medicine) used during the test).   You may shower.   Do not sign any important legal documents or operate any machinery for 24 hours (because of the anesthesia used during the test).  Nutrition  Drink plenty of fluids.   You may resume your normal diet.   Begin with a light meal and progress to your normal diet.   Avoid alcoholic beverages for 24 hours or as instructed by your caregiver.   Medications You may resume your normal medications unless your caregiver tells you otherwise.  What you can expect today  You may experience abdominal discomfort such as a feeling of fullness or "gas" pains.   You may experience a sore throat for 2 to 3 days. This is normal. Gargling with salt water may help this.   SEEK IMMEDIATE MEDICAL CARE IF:  You have excessive nausea (feeling sick to your stomach) and/or vomiting.   You have severe abdominal pain and distention (swelling).   You have trouble swallowing.   You have a temperature over 100 F (37.8 C).   You have rectal bleeding or vomiting of blood.    Document Released: 07/03/2004 Document Revised: 08/01/2011 Document Reviewed: 01/14/2008 ExitCare Patient Information 2012 [employer]. Endoscopy Care After Please read the instructions outlined below and refer to this sheet in the next few weeks. These discharge instructions provide you with general information on caring for yourself after you leave the hospital. Your doctor may also give you specific instructions. While your treatment has been planned according to the most current medical practices available, unavoidable complications occasionally occur. If you have any problems or questions after discharge, please call your doctor. HOME CARE INSTRUCTIONS Activity  You may resume your regular activity but move at a slower pace for the next 24 hours.   Take frequent rest periods for the next 24 hours.   Walking will help expel (get rid of) the air and reduce the bloated feeling in your abdomen.   No driving for 24 hours (because of the anesthesia (medicine) used during the test).   You may shower.   Do not sign any important legal documents or operate any machinery for 24 hours (because of the anesthesia used during the test).  Nutrition  Drink plenty of fluids.   You may resume your normal diet.   Begin with a light meal and progress to your normal diet.   Avoid alcoholic beverages for 24 hours or as instructed by your caregiver.  Medications You may resume your normal medications unless your caregiver tells you otherwise. What you can expect today  You may experience abdominal discomfort such as a feeling   of fullness or "gas" pains.   You may experience a sore throat for 2 to 3 days. This is normal. Gargling with salt water may help this.  Follow-up Your doctor will discuss the results of your test with you. SEEK IMMEDIATE MEDICAL CARE IF:  You have excessive nausea (feeling sick to your stomach) and/or vomiting.   You have severe abdominal pain and distention  (swelling).   You have trouble swallowing.   You have a temperature over 100 F (37.8 C).   You have rectal bleeding or vomiting of blood.  Document Released: 07/03/2004 Document Revised: 11/08/2011 Document Reviewed: 01/14/2008 ExitCare Patient Information 2012 [employer]. 

## 2012-05-15 ENCOUNTER — Encounter (HOSPITAL_COMMUNITY): Payer: Self-pay | Admitting: Gastroenterology

## 2012-05-16 NOTE — Anesthesia Postprocedure Evaluation (Signed)
Anesthesia Post Note  Patient: Gabrielle Wood  Procedure(s) Performed: Procedure(s) (LRB): ESOPHAGEAL ENDOSCOPIC ULTRASOUND (EUS) RADIAL (N/A)  Anesthesia type: MAC  Patient location: PACU  Post pain: Pain level controlled  Post assessment: Post-op Vital signs reviewed  Last Vitals:  Filed Vitals:   05/14/12 1130  BP: 135/70  Pulse:   Temp:   Resp: 19    Post vital signs: Reviewed  Level of consciousness: sedated  Complications: No apparent anesthesia complications

## 2012-05-21 ENCOUNTER — Encounter: Payer: Self-pay | Admitting: Gastroenterology

## 2012-05-21 ENCOUNTER — Ambulatory Visit (INDEPENDENT_AMBULATORY_CARE_PROVIDER_SITE_OTHER): Payer: Medicare Other | Admitting: Gastroenterology

## 2012-05-21 VITALS — BP 142/80 | HR 70 | Temp 97.8°F | Ht 67.0 in | Wt 177.8 lb

## 2012-05-21 DIAGNOSIS — K859 Acute pancreatitis without necrosis or infection, unspecified: Secondary | ICD-10-CM

## 2012-05-21 DIAGNOSIS — K2970 Gastritis, unspecified, without bleeding: Secondary | ICD-10-CM

## 2012-05-21 DIAGNOSIS — K2990 Gastroduodenitis, unspecified, without bleeding: Secondary | ICD-10-CM

## 2012-05-21 NOTE — Assessment & Plan Note (Signed)
DOUBT PT ACTUALLY HAD PANCREATITIS-LIPASE < 2XULN. NL EUS.  WILL MONITOR IN THE FUTURE.

## 2012-05-21 NOTE — Progress Notes (Signed)
Subjective:    Patient ID: Gabrielle Wood, female    DOB: 1946-07-23, 66 y.o.   MRN: [id number]  PCP: [facility]  HPI PT ADMITTED WITH NAUSEA, VOMITING, AND EPIGASTRIC PAIN ASSOCIATED WITH A MILDLY ELEVATED: LIPASE 120s. EGD REVEALED H PYLORI GASTRITIS/DUODENITIS, Rx: ABP X 10 DAYS. PT TAKING IBUPROFEN PRIOR TO ADMISSION W/O DAILY PPI. EUS JUN 2013 NO STONES/MICROLITHIASIS, NL PANCREAS. SX COMPLETELY RESOLVED. USING IBUPROFEN PRN FOR ARTHRITIS PAIN. TCS UTD.  Past Medical History  Diagnosis Date  . Diabetes mellitus   . Hypertension   . Thyroid disease   . COPD (chronic obstructive pulmonary disease)   . High cholesterol   . DVT (deep venous thrombosis)     years ago  . Hypothyroidism   . GERD (gastroesophageal reflux disease)   . Arthritis     Past Surgical History  Procedure Date  . Tonsillectomy   . Vein surgery     stripping years ago but recent laser surgery; bilateral legs  . Tubal ligation   . Esophagogastroduodenoscopy 04/04/2012    Moderate gastritis/MODERATE Duodenitis in the duodenal bulb duodenum  . Back surgery     lumbar  . Eus 05/14/2012    Procedure: ESOPHAGEAL ENDOSCOPIC ULTRASOUND (EUS) RADIAL;  Surgeon: Willis Modena, MD;  Location: [facility];  Service: Endoscopy;  Laterality: N/A;    Allergies  Allergen Reactions  . Lipitor (Atorvastatin) Other (See Comments)    Muscle aches, pain  . Red Yeast Rice (Cholestin) Itching and Rash    Current Outpatient Prescriptions  Medication Sig Dispense Refill  . ALPRAZolam (XANAX) 0.5 MG tablet Take 0.5 mg by mouth 2 (two) times daily.      [name] atenolol (TENORMIN) 25 MG tablet Take 25 mg by mouth daily.       . Biotin 1000 MCG tablet Take 1,000 mcg by mouth daily.      . CRESTOR 20 MG tablet Take 20 mg by mouth daily.       . fish oil-omega-3 fatty acids 1000 MG capsule Take 2 g by mouth daily.      [name] HYDROcodone-acetaminophen (LORTAB) 10-500 MG per tablet Take 1 tablet by mouth every 6 (six) hours as needed for  pain. For pain    . ibuprofen (ADVIL,MOTRIN) 800 MG tablet Take 800 mg by mouth every 6 (six) hours as needed.     . insulin aspart (NOVOLOG FLEXPEN) 100 UNIT/ML injection Inject 10 Units into the skin at bedtime.    [name] levothyroxine (SYNTHROID, LEVOTHROID) 150 MCG tablet Take 150 mcg by mouth daily.    . ondansetron (ZOFRAN) 4 MG tablet Take 1 tablet (4 mg total) by mouth every 6 (six) hours as needed for nausea. For nausea    . pantoprazole (PROTONIX) 40 MG tablet Take 1 tablet (40 mg total) by mouth 2 (two) times daily.         Review of Systems     Objective:   Physical Exam  Vitals reviewed. Constitutional: She is oriented to person, place, and time. She appears well-developed. No distress.  HENT:  Head: Normocephalic and atraumatic.  Mouth/Throat: Oropharynx is clear and moist. No oropharyngeal exudate.  Eyes: Pupils are equal, round, and reactive to light. No scleral icterus.  Neck: Normal range of motion. Neck supple.  Cardiovascular: Normal rate, regular rhythm and normal heart sounds.   Pulmonary/Chest: Effort normal and breath sounds normal. No respiratory distress.  Abdominal: Soft. Bowel sounds are normal. She exhibits no distension. There is tenderness (MILD TTP IN EPIGASTRIUM AND  LUQ).  Musculoskeletal: She exhibits no edema.  Neurological: She is alert and oriented to person, place, and time.       NO FOCAL DEFICITS   Psychiatric: She has a normal mood and affect.          Assessment & Plan:

## 2012-05-21 NOTE — Assessment & Plan Note (Addendum)
H. PYLORI GASTRITIS/DUODENITIS MOST LIKLEY CAUSE FOR NVABD PAIN IN MAY 2013. SX RESOLVED WITH BID PPI AND ABX.  CONTINUE PROTONIX TWICE DAILY UNTIL AUG 19,THEN GO DOWN TO ONCE DAILY. INCREASE PROTONIX TO TWICE DAILY IF YOU BEGIN TO FEEL ABDOMINAL PAIN, NAUSEA, OR HAVE VOMITING.   FOLLOW UP IN SEP 2013.

## 2012-05-21 NOTE — Patient Instructions (Addendum)
CONTINUE PROTONIX TWICE DAILY UNTIL AUG 19,THEN GO DOWN TO ONCE DAILY. INCREASE PROTONIX TO TWICE DAILY IF YOU BEGIN TO FEEL ABDOMINAL PAIN, NAUSEA, OR HAVE VOMITING.   FOLLOW UP IN SEP 2013.

## 2012-05-22 NOTE — Progress Notes (Signed)
Faxed to PCP

## 2012-06-02 NOTE — Progress Notes (Signed)
Reminder in epic to follow up in 2 months with SF in E30 

## 2012-07-16 ENCOUNTER — Encounter: Payer: Self-pay | Admitting: Gastroenterology

## 2012-08-19 ENCOUNTER — Encounter: Payer: Self-pay | Admitting: Gastroenterology

## 2012-08-20 ENCOUNTER — Ambulatory Visit: Payer: Medicare Other | Admitting: Gastroenterology

## 2015-06-08 DIAGNOSIS — E119 Type 2 diabetes mellitus without complications: Secondary | ICD-10-CM | POA: Insufficient documentation

## 2015-06-08 DIAGNOSIS — M069 Rheumatoid arthritis, unspecified: Secondary | ICD-10-CM

## 2015-06-08 HISTORY — DX: Rheumatoid arthritis, unspecified: M06.9

## 2015-06-23 ENCOUNTER — Ambulatory Visit: Payer: Medicare Other | Admitting: Nutrition

## 2015-09-08 LAB — HEMOGLOBIN A1C: Hgb A1c MFr Bld: 7.2 % — AB (ref 4.0–6.0)

## 2015-09-14 ENCOUNTER — Encounter: Payer: Self-pay | Admitting: "Endocrinology

## 2015-09-14 ENCOUNTER — Ambulatory Visit (INDEPENDENT_AMBULATORY_CARE_PROVIDER_SITE_OTHER): Payer: Medicare Other | Admitting: "Endocrinology

## 2015-09-14 VITALS — BP 126/72 | HR 67 | Wt 162.0 lb

## 2015-09-14 DIAGNOSIS — Z716 Tobacco abuse counseling: Secondary | ICD-10-CM | POA: Diagnosis not present

## 2015-09-14 DIAGNOSIS — I10 Essential (primary) hypertension: Secondary | ICD-10-CM

## 2015-09-14 DIAGNOSIS — E1165 Type 2 diabetes mellitus with hyperglycemia: Secondary | ICD-10-CM | POA: Diagnosis not present

## 2015-09-14 DIAGNOSIS — E038 Other specified hypothyroidism: Secondary | ICD-10-CM

## 2015-09-14 DIAGNOSIS — E1159 Type 2 diabetes mellitus with other circulatory complications: Secondary | ICD-10-CM

## 2015-09-14 DIAGNOSIS — Z794 Long term (current) use of insulin: Secondary | ICD-10-CM

## 2015-09-14 DIAGNOSIS — IMO0002 Reserved for concepts with insufficient information to code with codable children: Secondary | ICD-10-CM

## 2015-09-14 DIAGNOSIS — E785 Hyperlipidemia, unspecified: Secondary | ICD-10-CM | POA: Diagnosis not present

## 2015-09-14 DIAGNOSIS — E11649 Type 2 diabetes mellitus with hypoglycemia without coma: Secondary | ICD-10-CM | POA: Insufficient documentation

## 2015-09-14 NOTE — Patient Instructions (Signed)

## 2015-09-14 NOTE — Progress Notes (Signed)
Subjective:    Patient ID: Gabrielle Wood, female    DOB: 12-Sep-1946,    Past Medical History  Diagnosis Date  . Diabetes mellitus   . Hypertension   . Thyroid disease   . COPD (chronic obstructive pulmonary disease) (HCC)   . High cholesterol   . DVT (deep venous thrombosis) (HCC)     years ago  . Hypothyroidism   . GERD (gastroesophageal reflux disease)   . Arthritis    Past Surgical History  Procedure Laterality Date  . Tonsillectomy    . Vein surgery      stripping years ago but recent laser surgery; bilateral legs  . Tubal ligation    . Esophagogastroduodenoscopy  04/04/2012    Moderate gastritis/MODERATE Duodenitis in the duodenal bulb duodenum  . Back surgery      lumbar  . Eus  05/14/2012    Normal pancreas, gallbladder,biliary tree/ Incidental finding of simple-appearing right renal cyst.   Social History   Social History  . Marital Status: Widowed    Spouse Name: N/A  . Number of Children: 2  . Years of Education: N/A   Occupational History  . retired, Advertising copywriter at [employer]    Social History Main Topics  . Smoking status: Current Some Day Smoker -- 0.50 packs/day  . Smokeless tobacco: None  . Alcohol Use: No  . Drug Use: No  . Sexual Activity: Not Asked   Other Topics Concern  . None   Social History Narrative   Outpatient Encounter Prescriptions as of 09/14/2015  Medication Sig  . ALPRAZolam (XANAX) 0.5 MG tablet Take 0.5 mg by mouth 2 (two) times daily.  [name] atenolol (TENORMIN) 25 MG tablet Take 25 mg by mouth daily.   . Cholecalciferol (VITAMIN D3) 1000 UNITS CAPS Take by mouth daily.  . CRESTOR 20 MG tablet Take 20 mg by mouth daily.   . fish oil-omega-3 fatty acids 1000 MG capsule Take 2 g by mouth daily.  [name] gabapentin (NEURONTIN) 100 MG capsule Take 100 mg by mouth 3 (three) times daily.  [name] HYDROcodone-acetaminophen (LORTAB) 10-500 MG per tablet Take 1 tablet by mouth every 6 (six) hours as needed for pain. For pain  . ibuprofen  (ADVIL,MOTRIN) 800 MG tablet Take 800 mg by mouth every 6 (six) hours as needed.   . insulin lispro protamine-lispro (HUMALOG 75/25 MIX) (75-25) 100 UNIT/ML SUSP injection Inject into the skin 2 (two) times daily with a meal.  . losartan (COZAAR) 25 MG tablet Take 25 mg by mouth daily.  . metFORMIN (GLUCOPHAGE) 500 MG tablet Take by mouth 2 (two) times daily with a meal.  . pantoprazole (PROTONIX) 40 MG tablet Take 1 tablet (40 mg total) by mouth 2 (two) times daily.  . Biotin 1000 MCG tablet Take 1,000 mcg by mouth daily.  [name] levothyroxine (SYNTHROID, LEVOTHROID) 150 MCG tablet Take 150 mcg by mouth daily.  . ondansetron (ZOFRAN) 4 MG tablet Take 1 tablet (4 mg total) by mouth every 6 (six) hours as needed for nausea. For nausea  . [DISCONTINUED] insulin aspart (NOVOLOG FLEXPEN) 100 UNIT/ML injection Inject 10 Units into the skin at bedtime.   No facility-administered encounter medications on file as of 09/14/2015.   ALLERGIES: Allergies  Allergen Reactions  . Lipitor [Atorvastatin] Other (See Comments)    Muscle aches, pain  . Red Yeast Rice [Cholestin] Itching and Rash   VACCINATION STATUS: Immunization History  Administered Date(s) Administered  . Pneumococcal Polysaccharide-23 04/04/2012    Diabetes She  presents for her follow-up diabetic visit. She has type 2 diabetes mellitus. Onset time: She was diagnosed at approximate age of 31 years. Her disease course has been improving. There are no hypoglycemic associated symptoms. Pertinent negatives for hypoglycemia include no confusion, headaches, pallor or seizures. Associated symptoms include fatigue. Pertinent negatives for diabetes include no chest pain, no polydipsia, no polyphagia and no polyuria. There are no hypoglycemic complications. Symptoms are stable. Diabetic complications include heart disease. Risk factors for coronary artery disease include dyslipidemia, hypertension, sedentary lifestyle and tobacco exposure. Current  diabetic treatment includes insulin injections and oral agent (monotherapy). She is compliant with treatment most of the time. Her weight is stable. She participates in exercise intermittently. There is no change in her home blood glucose trend. Her overall blood glucose range is 140-180 mg/dl. An ACE inhibitor/angiotensin II receptor blocker is being taken. Eye exam is current.  Hyperlipidemia This is a chronic problem. The current episode started more than 1 year ago. Pertinent negatives include no chest pain, myalgias or shortness of breath. Current antihyperlipidemic treatment includes statins.  Hypertension This is a chronic problem. The current episode started more than 1 year ago. Pertinent negatives include no chest pain, headaches, palpitations or shortness of breath. Past treatments include alpha 1 blockers.     Review of Systems  Constitutional: Positive for fatigue. Negative for unexpected weight change.       She is trying to quit smoking, wears nicotine patch.  HENT: Negative for trouble swallowing and voice change.   Eyes: Negative for visual disturbance.  Respiratory: Negative for cough, shortness of breath and wheezing.   Cardiovascular: Negative for chest pain, palpitations and leg swelling.  Gastrointestinal: Negative for nausea, vomiting and diarrhea.  Endocrine: Negative for cold intolerance, heat intolerance, polydipsia, polyphagia and polyuria.  Musculoskeletal: Negative for myalgias and arthralgias.  Skin: Negative for color change, pallor, rash and wound.  Neurological: Negative for seizures and headaches.  Psychiatric/Behavioral: Negative for suicidal ideas and confusion.    Objective:    BP 126/72 mmHg  Pulse 67  Wt 162 lb (73.483 kg)  SpO2 96%  Wt Readings from Last 3 Encounters:  09/14/15 162 lb (73.483 kg)  05/21/12 177 lb 12.8 oz (80.65 kg)  05/14/12 184 lb (83.462 kg)    Physical Exam  Constitutional: She is oriented to person, place, and time. She  appears well-developed.  HENT:  Head: Normocephalic and atraumatic.  Eyes: EOM are normal.  Neck: Normal range of motion. Neck supple. No tracheal deviation present. No thyromegaly present.  Cardiovascular: Normal rate and regular rhythm.   Pulses:      Dorsalis pedis pulses are 1+ on the right side, and 1+ on the left side.       Posterior tibial pulses are 1+ on the right side, and 1+ on the left side.  Pulmonary/Chest: Effort normal and breath sounds normal.  Abdominal: Soft. Bowel sounds are normal. There is no tenderness. There is no guarding.  Musculoskeletal: Normal range of motion. She exhibits no edema.  Neurological: She is alert and oriented to person, place, and time. She has normal reflexes. No cranial nerve deficit. Coordination normal.  Skin: Skin is warm and dry. No rash noted. No erythema. No pallor.  Psychiatric: She has a normal mood and affect. Judgment normal.    Results for orders placed or performed in visit on 09/14/15  Hemoglobin A1c  Result Value Ref Range   Hgb A1c MFr Bld 7.2 (A) 4.0 - 6.0 %  Complete Blood Count (Most recent): Lab Results  Component Value Date   WBC 7.2 04/03/2012   HGB 13.4 04/03/2012   HCT 39.2 04/03/2012   MCV 90.7 04/03/2012   PLT 172 04/03/2012   Chemistry (most recent): Lab Results  Component Value Date   NA 141 04/04/2012   K 4.0 04/04/2012   CL 106 04/04/2012   CO2 24 04/04/2012   BUN 10 04/04/2012   CREATININE 0.66 04/04/2012   Diabetic Labs (most recent): Lab Results  Component Value Date   HGBA1C 7.2* 09/08/2015   Lipid profile (most recent): No results found for: TRIG, CHOL       Assessment & Plan:   1. Uncontrolled type 2 diabetes mellitus with other circulatory complication, with long-term current use of insulin (HCC)  Patient came with improved glucose profile, and  recent A1c of 7.2 % improvement from 8.2% from last visit.  Glucose logs and insulin administration records pertaining to this visit,   to be scanned into patient's records.  Recent labs reviewed. - Patient remains at a high risk for more acute and chronic complications of diabetes which include CAD, CVA, CKD, retinopathy, and neuropathy. These are all discussed in detail with the patient.  - I have re-counseled the patient on diet management and weight loss  by adopting a carbohydrate restricted / protein rich  Diet. - Patient is advised to stick to a routine mealtimes to eat 3 meals  a day and avoid unnecessary snacks ( to snack only to correct hypoglycemia).  - Suggestion is made for patient to avoid simple carbohydrates   from their diet including Cakes , Desserts, Ice Cream,  Soda (  diet and regular) , Sweet Tea , Candies,  Chips, Cookies, Artificial Sweeteners,   and "Sugar-free" Products .  This will help patient to have stable blood glucose profile and potentially avoid unintended  Weight gain.  - The patient  has been and waiting for a  scheduled visit with Norm SaltPenny Crumpton, RDN, CDE for individualized DM education. - I have approached patient with the following individualized plan to manage diabetes and patient agrees.  - I will proceed with premixed insulin Humalog 75/25 30 units with breakfast and 30 units at supper for pre-meal blood glucose above 19 mg/dL associated with strict monitoring of glucose  qAC and HS. - Patient is warned not to take insulin without proper monitoring per orders. -Adjustment parameters are given for hypo and hyperglycemia in writing. -Patient is encouraged to call clinic for blood glucose levels less than 70 or above 300 mg /dl. - I will continue metformin 500 mg by mouth twice a day, therapeutically suitable for patient..   - Patient will be considered for incretin therapy as appropriate next visit. - Patient specific target  for A1c; LDL, HDL, Triglycerides, and  Waist Circumference were discussed in detail.  2) BP/HTN: Controlled. Continue current medications including ARB-losartan 25 mg  by mouth q day. 3) Lipids/HPL:  continue statins. 4)  Weight/Diet: CDE consult in progress, exercise, and carbohydrates information provided.  5) Hypothyroidism: She is clinically euthyroid. I advised to continue levothyroxine 150 g by mouth every a.m.   - We discussed about correct intake of levothyroxine, at fasting, with water, separated by at least 30 minutes from breakfast, and separated by more than 4 hours from calcium, iron, multivitamins, acid reflux medications (PPIs). -Patient is made aware of the fact that thyroid hormone replacement is needed for life, dose to be adjusted by periodic monitoring of thyroid  function tests.  6) Tobacco abuse counseling -After extensive counseling, she is in the process of smoking cessation. She wears nicotine patch currently.  7) Chronic Care/Health Maintenance:  -Patient on ARB and Statin medications and encouraged to continue to follow up with Ophthalmology, Podiatrist at least yearly or according to recommendations, and advised to stay away from smoking. I have recommended yearly flu vaccine and pneumonia vaccination at least every 5 years; moderate intensity exercise for up to 150 minutes weekly; and  sleep for at least 7 hours a day.  I advised patient to maintain close follow up with their PCP for primary care needs.  Patient is asked to bring meter and  blood glucose logs during their next visit.   Follow up plan: Return in about 3 months (around 12/15/2015) for diabetes, high blood pressure, high cholesterol, underactive thyroid, follow up with pre-visit labs, meter, and logs.  Marquis Lunch, MD Phone: [phone]  Fax: [phone]   09/14/2015, 11:49 AM

## 2015-09-26 ENCOUNTER — Encounter: Payer: Medicare Other | Attending: "Endocrinology | Admitting: Nutrition

## 2015-09-26 ENCOUNTER — Encounter: Payer: Self-pay | Admitting: Nutrition

## 2015-09-26 VITALS — Ht 66.0 in | Wt 161.8 lb

## 2015-09-26 DIAGNOSIS — Z713 Dietary counseling and surveillance: Secondary | ICD-10-CM | POA: Insufficient documentation

## 2015-09-26 DIAGNOSIS — E118 Type 2 diabetes mellitus with unspecified complications: Secondary | ICD-10-CM

## 2015-09-26 DIAGNOSIS — IMO0002 Reserved for concepts with insufficient information to code with codable children: Secondary | ICD-10-CM

## 2015-09-26 DIAGNOSIS — Z794 Long term (current) use of insulin: Secondary | ICD-10-CM | POA: Diagnosis not present

## 2015-09-26 DIAGNOSIS — E1165 Type 2 diabetes mellitus with hyperglycemia: Secondary | ICD-10-CM | POA: Diagnosis present

## 2015-09-26 DIAGNOSIS — E785 Hyperlipidemia, unspecified: Secondary | ICD-10-CM | POA: Diagnosis not present

## 2015-09-26 NOTE — Progress Notes (Signed)
  Medical Nutrition Therapy:  Appt start time: 0900 end time:  1000.  Assessment:  Primary concerns today: Diabets. Most recent A1C 7.2%. Lives by herself with her son. She does the shopping and cooking. Most foods are baked.  Eating three meals per day. Has been watching what she eats and how much she eats. Has had back surgery. Limited with exercise. Currently taking 30 units of  Humalog 75/25 twice a day. She notes she has some low blood sugars between breakfast and lunch   Wt Readings from Last 3 Encounters:  09/26/15 161 lb 12.8 oz (73.392 kg)  09/14/15 162 lb (73.483 kg)  05/21/12 177 lb 12.8 oz (80.65 kg)   Ht Readings from Last 3 Encounters:  09/26/15 5\' 6"  (1.676 m)  05/21/12 5\' 7"  (1.702 m)  05/14/12 5\' 7"  (1.702 m)   Body mass index is 26.13 kg/(m^2).  Lab Results  Component Value Date   HGBA1C 7.2* 09/08/2015    Preferred Learning Style:    No preference indicated   Learning Readiness:   Ready  Change in progress   MEDICATIONS: See list   DIETARY INTAKE:    24-hr recall:  Eating three balanced meals per day. Feels much better. Sometimes feels like she has some low blood sugars before lunch. Eating 30-45 g of carbs and protein/veggies with most meals.  Usual physical activity:   Estimated energy needs: 1500 calories 170 g carbohydrates 112 g protein 42 g fat  Progress Towards Goal(s):  In progress.   Nutritional Diagnosis:  NB-1.1 Food and nutrition-related knowledge deficit As related to Diabetes.  As evidenced by A1C 7.2%..    Intervention:  Nutrition and Diabetes education provided on My Plate, CHO counting, meal planning, portion sizes, timing of meals, avoiding snacks between meals unless having a low blood sugar, target ranges for A1C and blood sugars, signs/symptoms and treatment of hyper/hypoglycemia, monitoring blood sugars, taking medications as prescribed, benefits of exercising 30 minutes per day and prevention of complications of  DM.  Goals 1. Follow My Plate 2. Be sure to eat 30-45 g of carbs per meal. 3. Talk to MD about low blood sugars before breakfast or before supper and possible need to change insulin doses. 4. Walk 30 minutes daily if able. 5. No snacks between meals unless a low blood sugar. 6. Eat meals on time. 7. Get A1C to 6.5% or less. 8. Take meds as prescribed.  Teaching Method Utilized:  Visual Auditory Hands on  Handouts given during visit include:  The Plate Method  Meal Plan Card  Diabetes instructions  Barriers to learning/adherence to lifestyle change: none  Demonstrated degree of understanding via:  Teach Back   Monitoring/Evaluation:  Dietary intake, exercise, meal planning, and body weight in 1- 3 month(s).

## 2015-10-03 NOTE — Patient Instructions (Signed)
Goals 1. Follow My Plate 2. Be sure to eat 30-45 g of carbs per meal. 3. Talk to MD about low blood sugars before breakfast or before supper and possible need to change insulin doses. 4. Walk 30 minutes daily if able. 5. No snacks between meals unless a low blood sugar. 6. Eat meals on time. 7. Get A1C to 6.5% or less. 8. Take meds as prescribed.

## 2015-10-24 ENCOUNTER — Other Ambulatory Visit: Payer: Self-pay | Admitting: "Endocrinology

## 2015-11-07 ENCOUNTER — Encounter: Payer: Medicare Other | Attending: "Endocrinology | Admitting: Nutrition

## 2015-11-07 ENCOUNTER — Encounter: Payer: Self-pay | Admitting: Nutrition

## 2015-11-07 VITALS — Ht 66.0 in | Wt 164.2 lb

## 2015-11-07 DIAGNOSIS — Z794 Long term (current) use of insulin: Secondary | ICD-10-CM | POA: Diagnosis not present

## 2015-11-07 DIAGNOSIS — E1165 Type 2 diabetes mellitus with hyperglycemia: Secondary | ICD-10-CM

## 2015-11-07 DIAGNOSIS — E785 Hyperlipidemia, unspecified: Secondary | ICD-10-CM | POA: Insufficient documentation

## 2015-11-07 DIAGNOSIS — E118 Type 2 diabetes mellitus with unspecified complications: Secondary | ICD-10-CM

## 2015-11-07 DIAGNOSIS — Z713 Dietary counseling and surveillance: Secondary | ICD-10-CM | POA: Insufficient documentation

## 2015-11-07 NOTE — Progress Notes (Signed)
Medical Nutrition Therapy:  Appt start time: 0900 end time:  1000.  Assessment:  Primary concerns today: Diabetes. Gained 4 lbs. 30 units of Humalog 75/25  And Metformin 500 mg twice a day.  She notes she is confused about what to eat and how much. Having BS in  100's - 200's and then low's in the 60's before she goes to bed. Tests blood sugars three times per day. Talked to Dr. Fransico Him who would like her to reduce her insulin to 20 units BID of Humalog 75/25 to prevent low blood sugars. She is going this week-Wednesday to get a shot in her back due to back pain. Is to call Dr. Fransico Him if BS are over 200 3 x in a row. Diet is inadequate in CHO's at certain meals. Not able to exercise due to back and leg pain s/p surgery. Denies snacks between meals. Eats PB and fruit or dessert to bring BS up when low.:  Downloaded meter. BS 200's in am, 160-200's before lunch and dinner and then 60-80's at bedtime. Diet needs more lower carb vegetables and at least 30 grams of carbs per meal.   Wt Readings from Last 3 Encounters:  11/07/15 164 lb 3.2 oz (74.481 kg)  09/26/15 161 lb 12.8 oz (73.392 kg)  09/14/15 162 lb (73.483 kg)   Ht Readings from Last 3 Encounters:  11/07/15  (1.676 m)  09/26/15  (1.676 m)  05/21/12  (1.702 m)   Body mass index is 26.52 kg/(m^2).  Lab Results  Component Value Date   HGBA1C 7.2* 09/08/2015   Preferred Learning Style:    No preference indicated   Learning Readiness:   Ready  Change in progress  MEDICATIONS: See list   DIETARY INTAKE:    24-hr recall:  B) 1-2 eggs, 2 slices bacon or sausage and 1 slice toast with little jelly. Coffee L) Beef stew and 1/2 piece bread and water D) Beef stew, 2 tbsp mac/cheese and 1/2 ear corn, water BS dropped and ate a small piece of pumpkin pie.  Usual physical activity: ADL and house work and some yard work.   Estimated energy needs: 1500 calories 170 g carbohydrates 112 g protein 42 g fat  Progress  Towards Goal(s):  In progress.   Nutritional Diagnosis:  NB-1.1 Food and nutrition-related knowledge deficit As related to Diabetes.  As evidenced by A1C 7.2%..    Intervention:  Nutrition and Diabetes education provided on My Plate, CHO counting, meal planning, portion sizes, timing of meals, avoiding snacks between meals unless having a low blood sugar, target ranges for A1C and blood sugars, signs/symptoms and treatment of hyper/hypoglycemia, monitoring blood sugars, taking medications as prescribed, benefits of exercising 30 minutes per day and prevention of complications of DM.  Goals 1. Follow My Plate 2. Be sure to eat 30-45 g of carbs per meal.. 3. No snacks between meals unless a low blood sugar. 4. Eat meals on time. 5. Get A1C to 6.5% or less. 6. Take meds as prescribed. 7. Contact Dr. Fransico Him if BS over 200 x 3 in a row. 8. Reduce insulin to 20  Units of 75/25 twice a day per Dr. Fransico Him.  Teaching Method Utilized:  Visual Auditory Hands on  Handouts given during visit include:  The Plate Method  Meal Plan Card  Diabetes instructions  Barriers to learning/adherence to lifestyle change: none  Demonstrated degree of understanding via:  Teach Back   Monitoring/Evaluation:  Dietary intake, exercise, meal planning, and  body weight in 1- 3 month(s).

## 2015-11-07 NOTE — Patient Instructions (Signed)
Goals 1. Follow My Plate 2. Be sure to eat 30-45 g of carbs per meal.. 3. No snacks between meals unless a low blood sugar. 4. Eat meals on time. 5. Get A1C to 6.5% or less. 6. Take meds as prescribed. 7. Contact Dr. Fransico HimNida if BS over 200 x 3 in a row. 8. Reduce insulin to 20  Units of 75/25 twice a day per Dr. Fransico HimNida.

## 2015-11-10 ENCOUNTER — Telehealth: Payer: Self-pay | Admitting: *Deleted

## 2015-11-10 NOTE — Telephone Encounter (Signed)
Advised to increase Humalog 75/25- to 40 units with breakfast and 40 units with supper for blood glucose above 90 mg/dL. I advised patient to call back if she continues to have hyperglycemia above 200 in 3 days.

## 2015-11-10 NOTE — Telephone Encounter (Signed)
Call received from patient. She was told to call if her blood sugars were running high after she received an injection for her back on Wednesday. Her blood sugar Wednesday morning 12/7 was 297, Wednesday afternnon 12/7 was 240, This morning 12/8 was 297. Please advise.

## 2015-11-10 NOTE — Telephone Encounter (Signed)
Patient informed of Dr. Isidoro DonningNida's recommendation. Patient verbalized understanding.

## 2015-12-09 ENCOUNTER — Other Ambulatory Visit: Payer: Self-pay | Admitting: "Endocrinology

## 2015-12-09 LAB — BASIC METABOLIC PANEL
BUN: 15 mg/dL (ref 7–25)
CO2: 24 mmol/L (ref 20–31)
Calcium: 9.6 mg/dL (ref 8.6–10.4)
Chloride: 104 mmol/L (ref 98–110)
Creat: 0.84 mg/dL (ref 0.60–0.93)
GLUCOSE: 197 mg/dL — AB (ref 65–99)
POTASSIUM: 4.1 mmol/L (ref 3.5–5.3)
SODIUM: 137 mmol/L (ref 135–146)

## 2015-12-09 LAB — HEMOGLOBIN A1C
Hgb A1c MFr Bld: 7.6 % — ABNORMAL HIGH (ref <5.7)
Mean Plasma Glucose: 171 mg/dL — ABNORMAL HIGH (ref <117)

## 2015-12-16 ENCOUNTER — Ambulatory Visit (INDEPENDENT_AMBULATORY_CARE_PROVIDER_SITE_OTHER): Payer: Medicare Other | Admitting: "Endocrinology

## 2015-12-16 ENCOUNTER — Encounter: Payer: Medicare Other | Attending: "Endocrinology | Admitting: Nutrition

## 2015-12-16 ENCOUNTER — Encounter: Payer: Self-pay | Admitting: Nutrition

## 2015-12-16 ENCOUNTER — Encounter: Payer: Self-pay | Admitting: "Endocrinology

## 2015-12-16 VITALS — BP 117/74 | HR 76 | Ht 66.0 in | Wt 159.0 lb

## 2015-12-16 VITALS — Ht 66.0 in | Wt 159.0 lb

## 2015-12-16 DIAGNOSIS — E1159 Type 2 diabetes mellitus with other circulatory complications: Secondary | ICD-10-CM

## 2015-12-16 DIAGNOSIS — E038 Other specified hypothyroidism: Secondary | ICD-10-CM

## 2015-12-16 DIAGNOSIS — E118 Type 2 diabetes mellitus with unspecified complications: Secondary | ICD-10-CM

## 2015-12-16 DIAGNOSIS — E785 Hyperlipidemia, unspecified: Secondary | ICD-10-CM | POA: Diagnosis not present

## 2015-12-16 DIAGNOSIS — Z794 Long term (current) use of insulin: Secondary | ICD-10-CM | POA: Diagnosis not present

## 2015-12-16 DIAGNOSIS — Z713 Dietary counseling and surveillance: Secondary | ICD-10-CM | POA: Insufficient documentation

## 2015-12-16 DIAGNOSIS — I10 Essential (primary) hypertension: Secondary | ICD-10-CM | POA: Diagnosis not present

## 2015-12-16 DIAGNOSIS — IMO0002 Reserved for concepts with insufficient information to code with codable children: Secondary | ICD-10-CM

## 2015-12-16 DIAGNOSIS — E1165 Type 2 diabetes mellitus with hyperglycemia: Secondary | ICD-10-CM

## 2015-12-16 NOTE — Patient Instructions (Signed)
Goals; 1. Follow My Plate 2. Increase fresh fruits and vegetables. 3. Cut out processed meats and high fat and salthy foods. 4. Drink 5 bottles of water. 5. 30 minutes three per walk. 6. [facility]. 7. Get A1C back to 7% in three months.

## 2015-12-16 NOTE — Progress Notes (Signed)
  Medical Nutrition Therapy:  Appt start time: 1130 end time:  1200. On  Assessment:  Primary concerns today: Diabetes Tyep 2.. Lost 5 lbs. A1C up to 7.6% from 7.2%. Increase to.26 units of Humalog 75/25 twice a day and Metformin 500 mg twice a day. Had been on steroids and ate little too much during the holidays. Walking some but not as much as she should. Going to get another shot for her back tomorrow.. Diet is high in processed meats and high fat/high sodium meats. Complains of being hungry. Not eating enough low carb vegetables or high fiber foods.  Lab Results  Component Value Date   HGBA1C 7.6* 12/09/2015    Wt Readings from Last 3 Encounters:  12/16/15 159 lb (72.122 kg)  12/16/15 159 lb (72.122 kg)  11/07/15 164 lb 3.2 oz (74.481 kg)   Ht Readings from Last 3 Encounters:  12/16/15 5\' 6"  (1.676 m)  12/16/15 5\' 6"  (1.676 m)  11/07/15 5\' 6"  (1.676 m)   Body mass index is 25.68 kg/(m^2).  Lab Results  Component Value Date   HGBA1C 7.6* 12/09/2015   Preferred Learning Style:    No preference indicated   Learning Readiness:   Ready  Change in progress  MEDICATIONS: See list   DIETARY INTAKE:    24-hr recall:  B) 1-2 eggs, 2 slices bacon or sausage and 1 slice toast with little jelly. Coffee L) liver pudding sandwich and celery and water, D) Hot dog, earn of corn, baked beans, water  Usual physical activity: ADL and house work and some yard work.   Estimated energy needs: 1500 calories 170 g carbohydrates 112 g protein 42 g fat  Progress Towards Goal(s):  In progress.   Nutritional Diagnosis:  NB-1.1 Food and nutrition-related knowledge deficit As related to Diabetes.  As evidenced by A1C 7.2%..    Intervention:  Nutrition and Diabetes education provided on My Plate, CHO counting, meal planning, portion sizes, timing of meals, avoiding snacks between meals unless having a low blood sugar, target ranges for A1C and blood sugars, signs/symptoms and treatment  of hyper/hypoglycemia, monitoring blood sugars, taking medications as prescribed, benefits of exercising 30 minutes per day and prevention of complications of DM.  Goals; 1. Follow My Plate 2. Increase fresh fruits and vegetables. 3. Cut out processed meats and high fat and salthy foods. 4. Drink 5 bottles of water. 5. 30 minutes three per walk. 6. [facility]. 7. Get A1C back to 7% in three months.  Teaching Method Utilized:  Visual Auditory Hands on  Handouts given during visit include:  The Plate Method  Meal Plan Card  Diabetes instructions  Barriers to learning/adherence to lifestyle change: none  Demonstrated degree of understanding via:  Teach Back   Monitoring/Evaluation:  Dietary intake, exercise, meal planning, and body weight in 1- 3 month(s).

## 2015-12-16 NOTE — Patient Instructions (Signed)

## 2015-12-16 NOTE — Progress Notes (Signed)
Subjective:    Patient ID: Gabrielle Wood, female    DOB: [birth year],    Past Medical History  Diagnosis Date  . Diabetes mellitus   . Hypertension   . Thyroid disease   . COPD (chronic obstructive pulmonary disease) (HCC)   . High cholesterol   . DVT (deep venous thrombosis) (HCC)     years ago  . Hypothyroidism   . GERD (gastroesophageal reflux disease)   . Arthritis    Past Surgical History  Procedure Laterality Date  . Tonsillectomy    . Vein surgery      stripping years ago but recent laser surgery; bilateral legs  . Tubal ligation    . Esophagogastroduodenoscopy  04/04/2012    Moderate gastritis/MODERATE Duodenitis in the duodenal bulb duodenum  . Back surgery      lumbar  . Eus  05/14/2012    Normal pancreas, gallbladder,biliary tree/ Incidental finding of simple-appearing right renal cyst.   Social History   Social History  . Marital Status: Widowed    Spouse Name: N/A  . Number of Children: 2  . Years of Education: N/A   Occupational History  . retired, Advertising copywriter at [employer]    Social History Main Topics  . Smoking status: Current Some Day Smoker -- 0.50 packs/day  . Smokeless tobacco: None  . Alcohol Use: No  . Drug Use: No  . Sexual Activity: Not Asked   Other Topics Concern  . None   Social History Narrative   Outpatient Encounter Prescriptions as of 12/16/2015  Medication Sig  . insulin lispro protamine-lispro (HUMALOG 75/25 MIX) (75-25) 100 UNIT/ML SUSP injection Inject 26 Units into the skin 2 (two) times daily with a meal.  . metFORMIN (GLUCOPHAGE) 500 MG tablet TAKE ONE TABLET BY MOUTH TWICE DAILY  . ALPRAZolam (XANAX) 0.5 MG tablet Take 0.5 mg by mouth 2 (two) times daily.  [name] atenolol (TENORMIN) 25 MG tablet Take 25 mg by mouth daily.   . Biotin 1000 MCG tablet Take 1,000 mcg by mouth daily.  . Cholecalciferol (VITAMIN D3) 1000 UNITS CAPS Take by mouth daily.  . CRESTOR 20 MG tablet Take 20 mg by mouth daily.   . fish oil-omega-3 fatty  acids 1000 MG capsule Take 2 g by mouth daily.  [name] gabapentin (NEURONTIN) 100 MG capsule Take 100 mg by mouth 3 (three) times daily.  [name] HYDROcodone-acetaminophen (LORTAB) 10-500 MG per tablet Take 1 tablet by mouth every 6 (six) hours as needed for pain. For pain  . ibuprofen (ADVIL,MOTRIN) 800 MG tablet Take 800 mg by mouth every 6 (six) hours as needed.   [name] levothyroxine (SYNTHROID, LEVOTHROID) 150 MCG tablet Take 150 mcg by mouth daily.  [name] losartan (COZAAR) 25 MG tablet Take 25 mg by mouth daily.  . ondansetron (ZOFRAN) 4 MG tablet Take 1 tablet (4 mg total) by mouth every 6 (six) hours as needed for nausea. For nausea  . pantoprazole (PROTONIX) 40 MG tablet Take 1 tablet (40 mg total) by mouth 2 (two) times daily.   No facility-administered encounter medications on file as of 12/16/2015.   ALLERGIES: Allergies  Allergen Reactions  . Ciprocin-Fluocin-Procin [Fluocinolone]     Nightmares  . Lipitor [Atorvastatin] Other (See Comments)    Muscle aches, pain  . Red Yeast Rice [Cholestin] Itching and Rash   VACCINATION STATUS: Immunization History  Administered Date(s) Administered  . Pneumococcal Polysaccharide-23 04/04/2012    Diabetes She presents for her follow-up diabetic visit. She has type 2  diabetes mellitus. Onset time: She was diagnosed at approximate age of 70 years. Her disease course has been improving. There are no hypoglycemic associated symptoms. Pertinent negatives for hypoglycemia include no confusion, headaches, pallor or seizures. Associated symptoms include fatigue. Pertinent negatives for diabetes include no chest pain, no polydipsia, no polyphagia and no polyuria. There are no hypoglycemic complications. Symptoms are stable. Diabetic complications include heart disease. Risk factors for coronary artery disease include dyslipidemia, hypertension, sedentary lifestyle and tobacco exposure. Current diabetic treatment includes insulin injections and oral agent (monotherapy).  She is compliant with treatment most of the time. Her weight is stable. She is following a generally unhealthy diet. When asked about meal planning, she reported none. She has had a previous visit with a dietitian. She participates in exercise intermittently. There is no change in her home blood glucose trend. Her overall blood glucose range is 140-180 mg/dl. An ACE inhibitor/angiotensin II receptor blocker is being taken. Eye exam is current.  Hyperlipidemia This is a chronic problem. The current episode started more than 1 year ago. Pertinent negatives include no chest pain, myalgias or shortness of breath. Current antihyperlipidemic treatment includes statins.  Hypertension This is a chronic problem. The current episode started more than 1 year ago. Pertinent negatives include no chest pain, headaches, palpitations or shortness of breath. Past treatments include alpha 1 blockers.     Review of Systems  Constitutional: Positive for fatigue. Negative for unexpected weight change.       She is trying to quit smoking, wears nicotine patch.  HENT: Negative for trouble swallowing and voice change.   Eyes: Negative for visual disturbance.  Respiratory: Negative for cough, shortness of breath and wheezing.   Cardiovascular: Negative for chest pain, palpitations and leg swelling.  Gastrointestinal: Negative for nausea, vomiting and diarrhea.  Endocrine: Negative for cold intolerance, heat intolerance, polydipsia, polyphagia and polyuria.  Musculoskeletal: Negative for myalgias and arthralgias.  Skin: Negative for color change, pallor, rash and wound.  Neurological: Negative for seizures and headaches.  Psychiatric/Behavioral: Negative for suicidal ideas and confusion.    Objective:    BP 117/74 mmHg  Pulse 76  Ht 5\' 6"  (1.676 m)  Wt 159 lb (72.122 kg)  BMI 25.68 kg/m2  SpO2 96%  Wt Readings from Last 3 Encounters:  12/16/15 159 lb (72.122 kg)  12/16/15 159 lb (72.122 kg)  11/07/15 164 lb  3.2 oz (74.481 kg)    Physical Exam  Constitutional: She is oriented to person, place, and time. She appears well-developed.  HENT:  Head: Normocephalic and atraumatic.  Eyes: EOM are normal.  Neck: Normal range of motion. Neck supple. No tracheal deviation present. No thyromegaly present.  Cardiovascular: Normal rate and regular rhythm.   Pulses:      Dorsalis pedis pulses are 1+ on the right side, and 1+ on the left side.       Posterior tibial pulses are 1+ on the right side, and 1+ on the left side.  Pulmonary/Chest: Effort normal and breath sounds normal.  Abdominal: Soft. Bowel sounds are normal. There is no tenderness. There is no guarding.  Musculoskeletal: Normal range of motion. She exhibits no edema.  Neurological: She is alert and oriented to person, place, and time. She has normal reflexes. No cranial nerve deficit. Coordination normal.  Skin: Skin is warm and dry. No rash noted. No erythema. No pallor.  Psychiatric: She has a normal mood and affect. Judgment normal.    Results for orders placed or performed in visit  on 12/09/15  Basic metabolic panel  Result Value Ref Range   Sodium 137 135 - 146 mmol/L   Potassium 4.1 3.5 - 5.3 mmol/L   Chloride 104 98 - 110 mmol/L   CO2 24 20 - 31 mmol/L   Glucose, Bld 197 (H) 65 - 99 mg/dL   BUN 15 7 - 25 mg/dL   Creat 6.60 6.30 - 1.60 mg/dL   Calcium 9.6 8.6 - 10.9 mg/dL  Hemoglobin N2T  Result Value Ref Range   Hgb A1c MFr Bld 7.6 (H) <5.7 %   Mean Plasma Glucose 171 (H) <117 mg/dL   Complete Blood Count (Most recent): Diabetic Labs (most recent): Lab Results  Component Value Date   HGBA1C 7.6* 12/09/2015   HGBA1C 7.2* 09/08/2015      Assessment & Plan:   1. Uncontrolled type 2 diabetes mellitus with other circulatory complication, with long-term current use of insulin (HCC)  Patient came with improved glucose profile, and  recent A1c of 7.6 % , generally improved from 8.2% from last visit.  Glucose logs and  insulin administration records pertaining to this visit,  to be scanned into patient's records.  Recent labs reviewed. - Patient remains at a high risk for more acute and chronic complications of diabetes which include CAD, CVA, CKD, retinopathy, and neuropathy. These are all discussed in detail with the patient.  - I have re-counseled the patient on diet management and weight loss  by adopting a carbohydrate restricted / protein rich  Diet. - Patient is advised to stick to a routine mealtimes to eat 3 meals  a day and avoid unnecessary snacks ( to snack only to correct hypoglycemia).  - Suggestion is made for patient to avoid simple carbohydrates   from their diet including Cakes , Desserts, Ice Cream,  Soda (  diet and regular) , Sweet Tea , Candies,  Chips, Cookies, Artificial Sweeteners,   and "Sugar-free" Products .  This will help patient to have stable blood glucose profile and potentially avoid unintended  Weight gain.  - The patient  has been and waiting for a  scheduled visit with Norm Salt, RDN, CDE for individualized DM education. - I have approached patient with the following individualized plan to manage diabetes and patient agrees.  - I will proceed with premixed insulin Humalog 75/25 26units with breakfast and 26 units at supper for pre-meal blood glucose above 19 mg/dL associated with strict monitoring of glucose  qAC and HS. - Patient is warned not to take insulin without proper monitoring per orders. -Adjustment parameters are given for hypo and hyperglycemia in writing. -Patient is encouraged to call clinic for blood glucose levels less than 70 or above 300 mg /dl. - I will continue metformin 500 mg by mouth twice a day, therapeutically suitable for patient.   - Patient will be considered for incretin therapy as appropriate next visit. - Patient specific target  for A1c; LDL, HDL, Triglycerides, and  Waist Circumference were discussed in detail.  2) BP/HTN: Controlled.  Continue current medications including ARB-losartan 25 mg by mouth q day. 3) Lipids/HPL:  continue statins. 4)  Weight/Diet: CDE consult in progress, exercise, and carbohydrates information provided.  5) Hypothyroidism: She is clinically euthyroid. I advised her to continue levothyroxine 150 g by mouth every a.m.   - We discussed about correct intake of levothyroxine, at fasting, with water, separated by at least 30 minutes from breakfast, and separated by more than 4 hours from calcium, iron, multivitamins, acid reflux  medications (PPIs). -Patient is made aware of the fact that thyroid hormone replacement is needed for life, dose to be adjusted by periodic monitoring of thyroid function tests.  6) Tobacco abuse counseling -After extensive counseling, she is in the process of smoking cessation. She wears nicotine patch currently.  7) Chronic Care/Health Maintenance:  -Patient on ARB and Statin medications and encouraged to continue to follow up with Ophthalmology, Podiatrist at least yearly or according to recommendations, and advised to stay away from smoking. I have recommended yearly flu vaccine and pneumonia vaccination at least every 5 years; moderate intensity exercise for up to 150 minutes weekly; and  sleep for at least 7 hours a day.  I advised patient to maintain close follow up with her PCP for primary care needs.  Patient is asked to bring meter and  blood glucose logs during their next visit.   Follow up plan: Return in about 3 months (around 03/15/2016) for diabetes, high blood pressure, high cholesterol, underactive thyroid, follow up with pre-visit labs, meter, and logs.  Marquis Lunch, MD Phone: [phone]  Fax: [phone]   12/16/2015, 2:07 PM

## 2016-01-26 ENCOUNTER — Telehealth: Payer: Self-pay

## 2016-01-26 NOTE — Telephone Encounter (Signed)
Called pt. No answer. No voicemail.

## 2016-01-26 NOTE — Telephone Encounter (Signed)
Please advise patient to increase Humalog 75/25-30 units with breakfast and 30 units with supper when blood glucose is above 90 mg/dL.

## 2016-01-26 NOTE — Telephone Encounter (Signed)
Pt states they have had high BG readings. Pt states she started prednisone yesterday afternoon  Date Before breakfast Before lunch Before supper Bedtime  2/22 151   345  2/23 337                   Pt taking: Humalog 75/25 26 units bid, MTF 500 mg bid

## 2016-01-26 NOTE — Telephone Encounter (Signed)
Pt.notified

## 2016-03-12 ENCOUNTER — Other Ambulatory Visit: Payer: Self-pay | Admitting: "Endocrinology

## 2016-03-12 LAB — BASIC METABOLIC PANEL
BUN: 16 mg/dL (ref 7–25)
CALCIUM: 9.7 mg/dL (ref 8.6–10.4)
CO2: 25 mmol/L (ref 20–31)
Chloride: 104 mmol/L (ref 98–110)
Creat: 0.7 mg/dL (ref 0.60–0.93)
Glucose, Bld: 175 mg/dL — ABNORMAL HIGH (ref 65–99)
POTASSIUM: 4.5 mmol/L (ref 3.5–5.3)
SODIUM: 137 mmol/L (ref 135–146)

## 2016-03-12 LAB — HEMOGLOBIN A1C
Hgb A1c MFr Bld: 7.6 % — ABNORMAL HIGH (ref <5.7)
Mean Plasma Glucose: 171 mg/dL

## 2016-03-12 LAB — TSH: TSH: 2.86 m[IU]/L

## 2016-03-12 LAB — T4, FREE: FREE T4: 1.5 ng/dL (ref 0.8–1.8)

## 2016-03-19 ENCOUNTER — Ambulatory Visit (INDEPENDENT_AMBULATORY_CARE_PROVIDER_SITE_OTHER): Payer: Medicare Other | Admitting: "Endocrinology

## 2016-03-19 ENCOUNTER — Encounter: Payer: Self-pay | Admitting: "Endocrinology

## 2016-03-19 ENCOUNTER — Encounter: Payer: Medicare Other | Attending: "Endocrinology | Admitting: Nutrition

## 2016-03-19 VITALS — BP 115/70 | HR 69 | Ht 66.0 in | Wt 154.0 lb

## 2016-03-19 VITALS — Ht 66.0 in | Wt 154.6 lb

## 2016-03-19 DIAGNOSIS — E038 Other specified hypothyroidism: Secondary | ICD-10-CM

## 2016-03-19 DIAGNOSIS — R7309 Other abnormal glucose: Secondary | ICD-10-CM | POA: Diagnosis present

## 2016-03-19 DIAGNOSIS — Z794 Long term (current) use of insulin: Secondary | ICD-10-CM

## 2016-03-19 DIAGNOSIS — IMO0002 Reserved for concepts with insufficient information to code with codable children: Secondary | ICD-10-CM

## 2016-03-19 DIAGNOSIS — E785 Hyperlipidemia, unspecified: Secondary | ICD-10-CM

## 2016-03-19 DIAGNOSIS — E1159 Type 2 diabetes mellitus with other circulatory complications: Secondary | ICD-10-CM

## 2016-03-19 DIAGNOSIS — E1165 Type 2 diabetes mellitus with hyperglycemia: Secondary | ICD-10-CM

## 2016-03-19 DIAGNOSIS — I10 Essential (primary) hypertension: Secondary | ICD-10-CM | POA: Diagnosis not present

## 2016-03-19 DIAGNOSIS — E118 Type 2 diabetes mellitus with unspecified complications: Secondary | ICD-10-CM

## 2016-03-19 NOTE — Patient Instructions (Addendum)
Goals; 1. Follow My Plate 2 Drink more water 3. Eat 3 carb choices per meal. 4. Eat dinner at 5 pm or after 5. 30 minutes of walking  three times per week. 6. [facility]. 7. Get A1C back to 7% in three months.

## 2016-03-19 NOTE — Progress Notes (Signed)
  Medical Nutrition Therapy:  Appt start time: 1130 end time:  1200. On  Assessment:  Primary concerns today: Diabetes Type 2 A1C sane 7,6% since last visit.  Lost 4  lbs.  On 75/25 Humlog insulin  25 BID. BS log brought in. BS highest in pm  and upper 100-200's in am.  Her diet is fairly well balanced. She is not eating many snacks between meal. She is drinking water and exercising some but complains of being tired. Has been through a lot of emotional stress with family issues, contributing to elevated blood sugars. She as sick for a whole month she notes and wasn't able to eat much at all. Maybe dehydrated contributing to her weight loss and higher blood sugars.  Lab Results  Component Value Date   HGBA1C 7.6* 03/12/2016    Wt Readings from Last 3 Encounters:  03/19/16 154 lb 9.6 oz (70.126 kg)  12/16/15 159 lb (72.122 kg)  12/16/15 159 lb (72.122 kg)   Ht Readings from Last 3 Encounters:  03/19/16 5\' 6"  (1.676 m)  12/16/15 5\' 6"  (1.676 m)  12/16/15 5\' 6"  (1.676 m)   Body mass index is 24.96 kg/(m^2).  Lab Results  Component Value Date   HGBA1C 7.6* 03/12/2016   Preferred Learning Style:    No preference indicated   Learning Readiness:   Ready  Change in progress  MEDICATIONS: See list   DIETARY INTAKE:    24-hr recall:  B) 2 eggs, bacon, 2 slices toast, coffee L) [place] biscuit and celery, water D)  Ham potato salad, green beans, mac and cheese and roll and water   Usual physical activity: ADL and house work and some yard work.   Estimated energy needs: 1500 calories 170 g carbohydrates 112 g protein 42 g fat  Progress Towards Goal(s):  In progress.   Nutritional Diagnosis:  NB-1.1 Food and nutrition-related knowledge deficit As related to Diabetes.  As evidenced by A1C 7.2%..    Intervention:  Nutrition and Diabetes education provided on My Plate, CHO counting, meal planning, portion sizes, timing of meals, avoiding snacks between meals unless having  a low blood sugar, target ranges for A1C and blood sugars, signs/symptoms and treatment of hyper/hypoglycemia, monitoring blood sugars, taking medications as prescribed, benefits of exercising 30 minutes per day and prevention of complications of DM.  Goals; 1. Follow My Plate 2 Drink more water 3. Eat 3 carb choices per meal. 4. Eat dinner at 5 pm or after 5. 30 minutes three times.. 6. [facility]. 7. Get A1C back to 7% in three months.  Teaching Method Utilized:  Visual Auditory Hands on  Handouts given during visit include:  The Plate Method  Meal Plan Card  Diabetes instructions  Barriers to learning/adherence to lifestyle change: none  Demonstrated degree of understanding via:  Teach Back   Monitoring/Evaluation:  Dietary intake, exercise, meal planning, and body weight in 1- 3 month(s). Will evaluate changes to be made by Dr. Fransico HimNida due to elevated blood sugars.

## 2016-03-19 NOTE — Progress Notes (Signed)
Subjective:    Patient ID: Gabrielle Wood, female    DOB: Feb 10, 1946,    Past Medical History  Diagnosis Date  . Diabetes mellitus   . Hypertension   . Thyroid disease   . COPD (chronic obstructive pulmonary disease) (HCC)   . High cholesterol   . DVT (deep venous thrombosis) (HCC)     years ago  . Hypothyroidism   . GERD (gastroesophageal reflux disease)   . Arthritis    Past Surgical History  Procedure Laterality Date  . Tonsillectomy    . Vein surgery      stripping years ago but recent laser surgery; bilateral legs  . Tubal ligation    . Esophagogastroduodenoscopy  04/04/2012    Moderate gastritis/MODERATE Duodenitis in the duodenal bulb duodenum  . Back surgery      lumbar  . Eus  05/14/2012    Normal pancreas, gallbladder,biliary tree/ Incidental finding of simple-appearing right renal cyst.   Social History   Social History  . Marital Status: Widowed    Spouse Name: N/A  . Number of Children: 2  . Years of Education: N/A   Occupational History  . retired, Advertising copywriter at [employer]    Social History Main Topics  . Smoking status: Current Some Day Smoker -- 0.50 packs/day  . Smokeless tobacco: None  . Alcohol Use: No  . Drug Use: No  . Sexual Activity: Not Asked   Other Topics Concern  . None   Social History Narrative   Outpatient Encounter Prescriptions as of 03/19/2016  Medication Sig  . ALPRAZolam (XANAX) 0.5 MG tablet Take 0.5 mg by mouth 2 (two) times daily.  [name] atenolol (TENORMIN) 25 MG tablet Take 25 mg by mouth daily.   . Biotin 1000 MCG tablet Take 1,000 mcg by mouth daily.  . Cholecalciferol (VITAMIN D3) 1000 UNITS CAPS Take by mouth daily.  . CRESTOR 20 MG tablet Take 20 mg by mouth daily.   . fish oil-omega-3 fatty acids 1000 MG capsule Take 2 g by mouth daily.  [name] gabapentin (NEURONTIN) 100 MG capsule Take 100 mg by mouth 3 (three) times daily.  [name] HYDROcodone-acetaminophen (LORTAB) 10-500 MG per tablet Take 1 tablet by mouth every 6 (six)  hours as needed for pain. For pain  . ibuprofen (ADVIL,MOTRIN) 800 MG tablet Take 800 mg by mouth every 6 (six) hours as needed.   . insulin lispro protamine-lispro (HUMALOG 75/25 MIX) (75-25) 100 UNIT/ML SUSP injection Inject 26-30 Units into the skin 2 (two) times daily with a meal. Inject 30 units of insulin with breakfast and 26 units of insulin with supper when blood glucose is above 90 mg/dL.  [name] levothyroxine (SYNTHROID, LEVOTHROID) 150 MCG tablet Take 150 mcg by mouth daily.  [name] losartan (COZAAR) 25 MG tablet Take 25 mg by mouth daily.  . metFORMIN (GLUCOPHAGE) 500 MG tablet TAKE ONE TABLET BY MOUTH TWICE DAILY  . ondansetron (ZOFRAN) 4 MG tablet Take 1 tablet (4 mg total) by mouth every 6 (six) hours as needed for nausea. For nausea  . pantoprazole (PROTONIX) 40 MG tablet Take 1 tablet (40 mg total) by mouth 2 (two) times daily.   No facility-administered encounter medications on file as of 03/19/2016.   ALLERGIES: Allergies  Allergen Reactions  . Ciprocin-Fluocin-Procin [Fluocinolone]     Nightmares  . Lipitor [Atorvastatin] Other (See Comments)    Muscle aches, pain  . Red Yeast Rice [Cholestin] Itching and Rash   VACCINATION STATUS: Immunization History  Administered Date(s)  Administered  . Pneumococcal Polysaccharide-23 04/04/2012    Diabetes She presents for her follow-up diabetic visit. She has type 2 diabetes mellitus. Onset time: She was diagnosed at approximate age of 41 years. Her disease course has been stable. There are no hypoglycemic associated symptoms. Pertinent negatives for hypoglycemia include no confusion, headaches, pallor or seizures. Associated symptoms include fatigue. Pertinent negatives for diabetes include no chest pain, no polydipsia, no polyphagia and no polyuria. There are no hypoglycemic complications. Symptoms are stable. Diabetic complications include heart disease. Risk factors for coronary artery disease include dyslipidemia, hypertension, sedentary  lifestyle and tobacco exposure. Current diabetic treatment includes insulin injections and oral agent (monotherapy). She is compliant with treatment most of the time. Her weight is stable. She is following a generally unhealthy diet. When asked about meal planning, she reported none. She has had a previous visit with a dietitian. She participates in exercise intermittently. There is no change in her home blood glucose trend. Her breakfast blood glucose range is generally 140-180 mg/dl. Her dinner blood glucose range is generally 180-200 mg/dl. Her overall blood glucose range is 180-200 mg/dl. An ACE inhibitor/angiotensin II receptor blocker is being taken. Eye exam is current.  Hyperlipidemia This is a chronic problem. The current episode started more than 1 year ago. Pertinent negatives include no chest pain, myalgias or shortness of breath. Current antihyperlipidemic treatment includes statins.  Hypertension This is a chronic problem. The current episode started more than 1 year ago. Pertinent negatives include no chest pain, headaches, palpitations or shortness of breath. Past treatments include alpha 1 blockers.     Review of Systems  Constitutional: Positive for fatigue. Negative for unexpected weight change.       She is trying to quit smoking, wears nicotine patch.  HENT: Negative for trouble swallowing and voice change.   Eyes: Negative for visual disturbance.  Respiratory: Negative for cough, shortness of breath and wheezing.   Cardiovascular: Negative for chest pain, palpitations and leg swelling.  Gastrointestinal: Negative for nausea, vomiting and diarrhea.  Endocrine: Negative for cold intolerance, heat intolerance, polydipsia, polyphagia and polyuria.  Musculoskeletal: Negative for myalgias and arthralgias.  Skin: Negative for color change, pallor, rash and wound.  Neurological: Negative for seizures and headaches.  Psychiatric/Behavioral: Negative for suicidal ideas and confusion.     Objective:    BP 115/70 mmHg  Pulse 69  Ht 5\' 6"  (1.676 m)  Wt 154 lb (69.854 kg)  BMI 24.87 kg/m2  SpO2 96%  Wt Readings from Last 3 Encounters:  03/19/16 154 lb (69.854 kg)  03/19/16 154 lb 9.6 oz (70.126 kg)  12/16/15 159 lb (72.122 kg)    Physical Exam  Constitutional: She is oriented to person, place, and time. She appears well-developed.  HENT:  Head: Normocephalic and atraumatic.  Eyes: EOM are normal.  Neck: Normal range of motion. Neck supple. No tracheal deviation present. No thyromegaly present.  Cardiovascular: Normal rate and regular rhythm.   Pulses:      Dorsalis pedis pulses are 1+ on the right side, and 1+ on the left side.       Posterior tibial pulses are 1+ on the right side, and 1+ on the left side.  Pulmonary/Chest: Effort normal and breath sounds normal.  Abdominal: Soft. Bowel sounds are normal. There is no tenderness. There is no guarding.  Musculoskeletal: Normal range of motion. She exhibits no edema.  Neurological: She is alert and oriented to person, place, and time. She has normal reflexes. No cranial nerve  deficit. Coordination normal.  Skin: Skin is warm and dry. No rash noted. No erythema. No pallor.  Psychiatric: She has a normal mood and affect. Judgment normal.    Results for orders placed or performed in visit on 03/12/16  Basic metabolic panel  Result Value Ref Range   Sodium 137 135 - 146 mmol/L   Potassium 4.5 3.5 - 5.3 mmol/L   Chloride 104 98 - 110 mmol/L   CO2 25 20 - 31 mmol/L   Glucose, Bld 175 (H) 65 - 99 mg/dL   BUN 16 7 - 25 mg/dL   Creat 1.61 0.96 - 0.45 mg/dL   Calcium 9.7 8.6 - 40.9 mg/dL  TSH  Result Value Ref Range   TSH 2.86 mIU/L  T4, free  Result Value Ref Range   Free T4 1.5 0.8 - 1.8 ng/dL  Hemoglobin W1X  Result Value Ref Range   Hgb A1c MFr Bld 7.6 (H) <5.7 %   Mean Plasma Glucose 171 mg/dL   Complete Blood Count (Most recent): Diabetic Labs (most recent): Lab Results  Component Value Date    HGBA1C 7.6* 03/12/2016   HGBA1C 7.6* 12/09/2015   HGBA1C 7.2* 09/08/2015      Assessment & Plan:   1. Uncontrolled type 2 diabetes mellitus with other circulatory complication, with long-term current use of insulin (HCC)  Patient came with improved glucose profile, and  recent A1c of 7.6 % , generally improved from 8.2% from last visit.  Glucose logs and insulin administration records pertaining to this visit,  to be scanned into patient's records.  Recent labs reviewed. - Patient remains at a high risk for more acute and chronic complications of diabetes which include CAD, CVA, CKD, retinopathy, and neuropathy. These are all discussed in detail with the patient.  - I have re-counseled the patient on diet management and weight loss  by adopting a carbohydrate restricted / protein rich  Diet. - Patient is advised to stick to a routine mealtimes to eat 3 meals  a day and avoid unnecessary snacks ( to snack only to correct hypoglycemia).  - Suggestion is made for patient to avoid simple carbohydrates   from their diet including Cakes , Desserts, Ice Cream,  Soda (  diet and regular) , Sweet Tea , Candies,  Chips, Cookies, Artificial Sweeteners,   and "Sugar-free" Products .  This will help patient to have stable blood glucose profile and potentially avoid unintended  Weight gain.  - The patient  has been and waiting for a  scheduled visit with Norm Salt, RDN, CDE for individualized DM education. - I have approached patient with the following individualized plan to manage diabetes and patient agrees.  - I will proceed with premixed insulin Humalog 75/2530 units with breakfast and 26 units at supper for pre-meal blood glucose above 90 mg/dL associated with strict monitoring of glucose  qAC and HS. - Patient is warned not to take insulin without proper monitoring per orders. -Adjustment parameters are given for hypo and hyperglycemia in writing. -Patient is encouraged to call clinic for blood  glucose levels less than 70 or above 300 mg /dl. - I will continue metformin 500 mg by mouth twice a day, therapeutically suitable for patient.   - Patient will be considered for incretin therapy as appropriate next visit. - Patient specific target  for A1c; LDL, HDL, Triglycerides, and  Waist Circumference were discussed in detail.  2) BP/HTN: Controlled. Continue current medications including ARB-losartan 25 mg by mouth q day.  3) Lipids/HPL:  continue statins. 4)  Weight/Diet: CDE consult in progress, exercise, and carbohydrates information provided.  5) Hypothyroidism: She is clinically euthyroid. I advised her to continue levothyroxine 150 g by mouth every a.m.   - We discussed about correct intake of levothyroxine, at fasting, with water, separated by at least 30 minutes from breakfast, and separated by more than 4 hours from calcium, iron, multivitamins, acid reflux medications (PPIs). -Patient is made aware of the fact that thyroid hormone replacement is needed for life, dose to be adjusted by periodic monitoring of thyroid function tests.  6) Tobacco abuse counseling -After extensive counseling, she is in the process of smoking cessation. She wears nicotine patch currently.  7) Chronic Care/Health Maintenance:  -Patient on ARB and Statin medications and encouraged to continue to follow up with Ophthalmology, Podiatrist at least yearly or according to recommendations, and advised to stay away from smoking. I have recommended yearly flu vaccine and pneumonia vaccination at least every 5 years; moderate intensity exercise for up to 150 minutes weekly; and  sleep for at least 7 hours a day.  I advised patient to maintain close follow up with her PCP for primary care needs.  Patient is asked to bring meter and  blood glucose logs during their next visit.   Follow up plan: Return in about 3 months (around 06/18/2016) for diabetes, high blood pressure, high cholesterol, underactive  thyroid, follow up with pre-visit labs, meter, and logs.  Marquis LunchGebre Candiace West, MD Phone: [phone]  Fax: [phone]   03/19/2016, 12:01 PM

## 2016-03-19 NOTE — Patient Instructions (Signed)

## 2016-04-24 ENCOUNTER — Other Ambulatory Visit: Payer: Self-pay | Admitting: "Endocrinology

## 2016-06-14 ENCOUNTER — Other Ambulatory Visit: Payer: Self-pay | Admitting: "Endocrinology

## 2016-06-14 LAB — TSH: TSH: 3.05 mIU/L

## 2016-06-14 LAB — T4, FREE: Free T4: 1.6 ng/dL (ref 0.8–1.8)

## 2016-06-14 LAB — HEMOGLOBIN A1C
HEMOGLOBIN A1C: 8 % — AB (ref <5.7)
MEAN PLASMA GLUCOSE: 183 mg/dL

## 2016-06-15 LAB — COMPLETE METABOLIC PANEL WITH GFR
ALBUMIN: 4.5 g/dL (ref 3.6–5.1)
ALK PHOS: 45 U/L (ref 33–130)
ALT: 15 U/L (ref 6–29)
AST: 16 U/L (ref 10–35)
BUN: 21 mg/dL (ref 7–25)
CHLORIDE: 99 mmol/L (ref 98–110)
CO2: 24 mmol/L (ref 20–31)
Calcium: 9.9 mg/dL (ref 8.6–10.4)
Creat: 0.95 mg/dL — ABNORMAL HIGH (ref 0.60–0.93)
GFR, EST NON AFRICAN AMERICAN: 60 mL/min (ref >=60)
GFR, Est African American: 70 mL/min (ref >=60)
GLUCOSE: 286 mg/dL — AB (ref 65–99)
POTASSIUM: 4.2 mmol/L (ref 3.5–5.3)
SODIUM: 134 mmol/L — AB (ref 135–146)
Total Bilirubin: 0.7 mg/dL (ref 0.2–1.2)
Total Protein: 6.6 g/dL (ref 6.1–8.1)

## 2016-06-21 ENCOUNTER — Encounter: Payer: Self-pay | Admitting: "Endocrinology

## 2016-06-21 ENCOUNTER — Ambulatory Visit (INDEPENDENT_AMBULATORY_CARE_PROVIDER_SITE_OTHER): Payer: Medicare Other | Admitting: "Endocrinology

## 2016-06-21 VITALS — BP 116/68 | HR 66 | Resp 18 | Wt 150.0 lb

## 2016-06-21 DIAGNOSIS — I10 Essential (primary) hypertension: Secondary | ICD-10-CM | POA: Diagnosis not present

## 2016-06-21 DIAGNOSIS — E1165 Type 2 diabetes mellitus with hyperglycemia: Secondary | ICD-10-CM

## 2016-06-21 DIAGNOSIS — E038 Other specified hypothyroidism: Secondary | ICD-10-CM

## 2016-06-21 DIAGNOSIS — E785 Hyperlipidemia, unspecified: Secondary | ICD-10-CM | POA: Diagnosis not present

## 2016-06-21 DIAGNOSIS — E1159 Type 2 diabetes mellitus with other circulatory complications: Secondary | ICD-10-CM

## 2016-06-21 DIAGNOSIS — IMO0002 Reserved for concepts with insufficient information to code with codable children: Secondary | ICD-10-CM

## 2016-06-21 DIAGNOSIS — Z794 Long term (current) use of insulin: Secondary | ICD-10-CM | POA: Diagnosis not present

## 2016-06-21 NOTE — Progress Notes (Signed)
Subjective:    Patient ID: Gabrielle Wood, female    DOB: 1945-12-22,    Past Medical History  Diagnosis Date  . Diabetes mellitus   . Hypertension   . Thyroid disease   . COPD (chronic obstructive pulmonary disease) (HCC)   . High cholesterol   . DVT (deep venous thrombosis) (HCC)     years ago  . Hypothyroidism   . GERD (gastroesophageal reflux disease)   . Arthritis    Past Surgical History  Procedure Laterality Date  . Tonsillectomy    . Vein surgery      stripping years ago but recent laser surgery; bilateral legs  . Tubal ligation    . Esophagogastroduodenoscopy  04/04/2012    Moderate gastritis/MODERATE Duodenitis in the duodenal bulb duodenum  . Back surgery      lumbar  . Eus  05/14/2012    Normal pancreas, gallbladder,biliary tree/ Incidental finding of simple-appearing right renal cyst.   Social History   Social History  . Marital Status: Widowed    Spouse Name: N/A  . Number of Children: 2  . Years of Education: N/A   Occupational History  . retired, Advertising copywriterhousekeeper at [employer]    Social History Main Topics  . Smoking status: Current Some Day Smoker -- 0.50 packs/day  . Smokeless tobacco: Not on file  . Alcohol Use: No  . Drug Use: No  . Sexual Activity: Not on file   Other Topics Concern  . Not on file   Social History Narrative   Outpatient Encounter Prescriptions as of 06/21/2016  Medication Sig  . ALPRAZolam (XANAX) 0.5 MG tablet Take 0.5 mg by mouth 2 (two) times daily.  [name]. atenolol (TENORMIN) 25 MG tablet Take 25 mg by mouth daily.   . Biotin 1000 MCG tablet Take 1,000 mcg by mouth daily.  . Cholecalciferol (VITAMIN D3) 1000 UNITS CAPS Take by mouth daily.  . CRESTOR 20 MG tablet Take 20 mg by mouth daily.   . fish oil-omega-3 fatty acids 1000 MG capsule Take 2 g by mouth daily.  [name]. gabapentin (NEURONTIN) 100 MG capsule Take 100 mg by mouth 3 (three) times daily.  [name]. HYDROcodone-acetaminophen (LORTAB) 10-500 MG per tablet Take 1 tablet by mouth  every 6 (six) hours as needed for pain. For pain  . ibuprofen (ADVIL,MOTRIN) 800 MG tablet Take 800 mg by mouth every 6 (six) hours as needed.   . insulin lispro protamine-lispro (HUMALOG 75/25 MIX) (75-25) 100 UNIT/ML SUSP injection Inject 30 Units into the skin 2 (two) times daily with a meal. Inject 30 units of insulin with breakfast and 30 units of insulin with supper when blood glucose is above 90 mg/dL.  [name]. levothyroxine (SYNTHROID, LEVOTHROID) 150 MCG tablet Take 150 mcg by mouth daily.  [name]. losartan (COZAAR) 25 MG tablet Take 25 mg by mouth daily.  . metFORMIN (GLUCOPHAGE) 500 MG tablet TAKE ONE TABLET BY MOUTH TWICE DAILY  . ondansetron (ZOFRAN) 4 MG tablet Take 1 tablet (4 mg total) by mouth every 6 (six) hours as needed for nausea. For nausea  . pantoprazole (PROTONIX) 40 MG tablet Take 1 tablet (40 mg total) by mouth 2 (two) times daily.   No facility-administered encounter medications on file as of 06/21/2016.   ALLERGIES: Allergies  Allergen Reactions  . Ciprocin-Fluocin-Procin [Fluocinolone]     Nightmares  . Lipitor [Atorvastatin] Other (See Comments)    Muscle aches, pain  . Red Yeast Rice [Cholestin] Itching and Rash   VACCINATION STATUS:  Immunization History  Administered Date(s) Administered  . Pneumococcal Polysaccharide-23 04/04/2012    Diabetes She presents for her follow-up diabetic visit. She has type 2 diabetes mellitus. Onset time: She was diagnosed at approximate age of 24 years. Her disease course has been worsening. There are no hypoglycemic associated symptoms. Pertinent negatives for hypoglycemia include no confusion, headaches, pallor or seizures. Associated symptoms include fatigue. Pertinent negatives for diabetes include no chest pain, no polydipsia, no polyphagia and no polyuria. There are no hypoglycemic complications. Symptoms are worsening. Diabetic complications include heart disease. Risk factors for coronary artery disease include dyslipidemia,  hypertension, sedentary lifestyle and tobacco exposure. Current diabetic treatment includes insulin injections and oral agent (monotherapy). She is compliant with treatment most of the time. Her weight is stable. She is following a generally unhealthy diet. When asked about meal planning, she reported none. She has had a previous visit with a dietitian. She participates in exercise intermittently. There is no change in her home blood glucose trend. Her breakfast blood glucose range is generally 140-180 mg/dl. Her dinner blood glucose range is generally 180-200 mg/dl. Her overall blood glucose range is 180-200 mg/dl. An ACE inhibitor/angiotensin II receptor blocker is being taken. Eye exam is current.  Hyperlipidemia This is a chronic problem. The current episode started more than 1 year ago. Pertinent negatives include no chest pain, myalgias or shortness of breath. Current antihyperlipidemic treatment includes statins.  Hypertension This is a chronic problem. The current episode started more than 1 year ago. Pertinent negatives include no chest pain, headaches, palpitations or shortness of breath. Past treatments include alpha 1 blockers.     Review of Systems  Constitutional: Positive for fatigue. Negative for unexpected weight change.       She is trying to quit smoking, wears nicotine patch.  HENT: Negative for trouble swallowing and voice change.   Eyes: Negative for visual disturbance.  Respiratory: Negative for cough, shortness of breath and wheezing.   Cardiovascular: Negative for chest pain, palpitations and leg swelling.  Gastrointestinal: Negative for nausea, vomiting and diarrhea.  Endocrine: Negative for cold intolerance, heat intolerance, polydipsia, polyphagia and polyuria.  Musculoskeletal: Negative for myalgias and arthralgias.  Skin: Negative for color change, pallor, rash and wound.  Neurological: Negative for seizures and headaches.  Psychiatric/Behavioral: Negative for  suicidal ideas and confusion.    Objective:    BP 116/68 mmHg  Pulse 66  Resp 18  Wt 150 lb (68.04 kg)  SpO2 98%  Wt Readings from Last 3 Encounters:  06/21/16 150 lb (68.04 kg)  03/19/16 154 lb (69.854 kg)  03/19/16 154 lb 9.6 oz (70.126 kg)    Physical Exam  Constitutional: She is oriented to person, place, and time. She appears well-developed.  HENT:  Head: Normocephalic and atraumatic.  Eyes: EOM are normal.  Neck: Normal range of motion. Neck supple. No tracheal deviation present. No thyromegaly present.  Cardiovascular: Normal rate and regular rhythm.   Pulses:      Dorsalis pedis pulses are 1+ on the right side, and 1+ on the left side.       Posterior tibial pulses are 1+ on the right side, and 1+ on the left side.  Pulmonary/Chest: Effort normal and breath sounds normal.  Abdominal: Soft. Bowel sounds are normal. There is no tenderness. There is no guarding.  Musculoskeletal: Normal range of motion. She exhibits no edema.  Neurological: She is alert and oriented to person, place, and time. She has normal reflexes. No cranial nerve deficit. Coordination  normal.  Skin: Skin is warm and dry. No rash noted. No erythema. No pallor.  Psychiatric: She has a normal mood and affect. Judgment normal.    Results for orders placed or performed in visit on 06/14/16  COMPLETE METABOLIC PANEL WITH GFR  Result Value Ref Range   Sodium 134 (L) 135 - 146 mmol/L   Potassium 4.2 3.5 - 5.3 mmol/L   Chloride 99 98 - 110 mmol/L   CO2 24 20 - 31 mmol/L   Glucose, Bld 286 (H) 65 - 99 mg/dL   BUN 21 7 - 25 mg/dL   Creat 1.61 (H) 0.96 - 0.93 mg/dL   Total Bilirubin 0.7 0.2 - 1.2 mg/dL   Alkaline Phosphatase 45 33 - 130 U/L   AST 16 10 - 35 U/L   ALT 15 6 - 29 U/L   Total Protein 6.6 6.1 - 8.1 g/dL   Albumin 4.5 3.6 - 5.1 g/dL   Calcium 9.9 8.6 - 04.5 mg/dL   GFR, Est African American 70 >=60 mL/min   GFR, Est Non African American 60 >=60 mL/min  TSH  Result Value Ref Range   TSH  3.05 mIU/L  T4, free  Result Value Ref Range   Free T4 1.6 0.8 - 1.8 ng/dL  Hemoglobin W0J  Result Value Ref Range   Hgb A1c MFr Bld 8.0 (H) <5.7 %   Mean Plasma Glucose 183 mg/dL   Complete Blood Count (Most recent): Diabetic Labs (most recent): Lab Results  Component Value Date   HGBA1C 8.0* 06/14/2016   HGBA1C 7.6* 03/12/2016   HGBA1C 7.6* 12/09/2015      Assessment & Plan:   1. Uncontrolled type 2 diabetes mellitus with other circulatory complication, with long-term current use of insulin (HCC)  Patient came with Above target postprandial glucose profile, and  recent A1c of  8% increasing from 7.6 % .   Glucose logs and insulin administration records pertaining to this visit,  to be scanned into patient's records.  Recent labs reviewed. - Patient remains at a high risk for more acute and chronic complications of diabetes which include CAD, CVA, CKD, retinopathy, and neuropathy. These are all discussed in detail with the patient.  - I have re-counseled the patient on diet management and weight loss  by adopting a carbohydrate restricted / protein rich  Diet. - Patient is advised to stick to a routine mealtimes to eat 3 meals  a day and avoid unnecessary snacks ( to snack only to correct hypoglycemia).  - Suggestion is made for patient to avoid simple carbohydrates   from their diet including Cakes , Desserts, Ice Cream,  Soda (  diet and regular) , Sweet Tea , Candies,  Chips, Cookies, Artificial Sweeteners,   and "Sugar-free" Products .  This will help patient to have stable blood glucose profile and potentially avoid unintended  Weight gain.  - The patient  has been and waiting for a  scheduled visit with Norm Salt, RDN, CDE for individualized DM education. - I have approached patient with the following individualized plan to manage diabetes and patient agrees.  - I will proceed with premixed insulin Humalog 75/25 30 units with breakfast and increased to 30 units at  supper for pre-meal blood glucose above 90 mg/dL associated with strict monitoring of glucose  qAC and HS. - Patient is warned not to take insulin without proper monitoring per orders. -Adjustment parameters are given for hypo and hyperglycemia in writing. -Patient is encouraged to call clinic  for blood glucose levels less than 70 or above 300 mg /dl. - I will continue metformin 500 mg by mouth twice a day, therapeutically suitable for patient.   - Patient will be considered for incretin therapy as appropriate next visit. - Patient specific target  for A1c; LDL, HDL, Triglycerides, and  Waist Circumference were discussed in detail.  2) BP/HTN: Controlled. Continue current medications including ARB-losartan 25 mg by mouth q day. 3) Lipids/HPL:  continue statins. 4)  Weight/Diet: CDE consult in progress, exercise, and carbohydrates information provided.  5) Hypothyroidism: - Due to her recent weight loss her TSH and free T4 were determined at her primary care doctor's office. TSH was 0.758 along with slightly elevated free T4 of 1.98 (normal 0.8 2-1.77) . - Her levothyroxine dose was lowered to 137 g by mouth every morning. This appropriate adjustment.    - We discussed about correct intake of levothyroxine, at fasting, with water, separated by at least 30 minutes from breakfast, and separated by more than 4 hours from calcium, iron, multivitamins, acid reflux medications (PPIs). -Patient is made aware of the fact that thyroid hormone replacement is needed for life, dose to be adjusted by periodic monitoring of thyroid function tests.  6) Tobacco abuse counseling -After extensive counseling, she is in the process of smoking cessation. She wears nicotine patch currently.  7) Chronic Care/Health Maintenance:  -Patient on ARB and Statin medications and encouraged to continue to follow up with Ophthalmology, Podiatrist at least yearly or according to recommendations, and advised to stay away  from smoking. I have recommended yearly flu vaccine and pneumonia vaccination at least every 5 years; moderate intensity exercise for up to 150 minutes weekly; and  sleep for at least 7 hours a day.  I advised patient to maintain close follow up with her PCP for primary care needs.  Patient is asked to bring meter and  blood glucose logs during their next visit.   Follow up plan: Return in about 3 months (around 09/21/2016) for follow up with pre-visit labs, meter, and logs.  Marquis Lunch, MD Phone: [phone]  Fax: [phone]   06/21/2016, 5:14 PM

## 2016-08-13 ENCOUNTER — Other Ambulatory Visit: Payer: Self-pay | Admitting: "Endocrinology

## 2016-09-20 ENCOUNTER — Other Ambulatory Visit: Payer: Self-pay | Admitting: "Endocrinology

## 2016-09-20 LAB — COMPLETE METABOLIC PANEL WITH GFR
ALT: 10 U/L (ref 6–29)
AST: 13 U/L (ref 10–35)
Albumin: 4.2 g/dL (ref 3.6–5.1)
Alkaline Phosphatase: 43 U/L (ref 33–130)
BILIRUBIN TOTAL: 0.5 mg/dL (ref 0.2–1.2)
BUN: 21 mg/dL (ref 7–25)
CHLORIDE: 103 mmol/L (ref 98–110)
CO2: 22 mmol/L (ref 20–31)
Calcium: 9.6 mg/dL (ref 8.6–10.4)
Creat: 0.78 mg/dL (ref 0.60–0.93)
GFR, EST AFRICAN AMERICAN: 88 mL/min (ref >=60)
GFR, EST NON AFRICAN AMERICAN: 77 mL/min (ref >=60)
Glucose, Bld: 296 mg/dL — ABNORMAL HIGH (ref 65–99)
POTASSIUM: 4.5 mmol/L (ref 3.5–5.3)
SODIUM: 136 mmol/L (ref 135–146)
Total Protein: 6.4 g/dL (ref 6.1–8.1)

## 2016-09-20 LAB — TSH: TSH: 1.91 mIU/L

## 2016-09-20 LAB — T4, FREE: FREE T4: 1.5 ng/dL (ref 0.8–1.8)

## 2016-09-21 ENCOUNTER — Telehealth: Payer: Self-pay

## 2016-09-21 LAB — HEMOGLOBIN A1C
HEMOGLOBIN A1C: 7.7 % — AB (ref <5.7)
MEAN PLASMA GLUCOSE: 174 mg/dL

## 2016-09-21 NOTE — Telephone Encounter (Signed)
Pt states she hs had high & low BG readings.   Date Before breakfast Before lunch Before supper Bedtime  10/17 255  229 52  10/18 202  220 253  10/19 236  [phone]  10/20 232       Pt taking: Humalog 75/25 30 units bid, MTF 500 mg bid

## 2016-09-21 NOTE — Telephone Encounter (Signed)
Lower supper time insulin to 20 units, call back if >300x3 or <70 x3.

## 2016-09-21 NOTE — Telephone Encounter (Signed)
Pt notified and agrees. 

## 2016-09-27 ENCOUNTER — Ambulatory Visit (INDEPENDENT_AMBULATORY_CARE_PROVIDER_SITE_OTHER): Payer: Medicare Other | Admitting: "Endocrinology

## 2016-09-27 ENCOUNTER — Encounter: Payer: Self-pay | Admitting: "Endocrinology

## 2016-09-27 ENCOUNTER — Ambulatory Visit: Payer: Medicare Other | Admitting: "Endocrinology

## 2016-09-27 VITALS — BP 130/74 | HR 71 | Ht 66.0 in | Wt 148.0 lb

## 2016-09-27 DIAGNOSIS — E038 Other specified hypothyroidism: Secondary | ICD-10-CM

## 2016-09-27 DIAGNOSIS — E782 Mixed hyperlipidemia: Secondary | ICD-10-CM

## 2016-09-27 DIAGNOSIS — E1165 Type 2 diabetes mellitus with hyperglycemia: Secondary | ICD-10-CM

## 2016-09-27 DIAGNOSIS — IMO0002 Reserved for concepts with insufficient information to code with codable children: Secondary | ICD-10-CM

## 2016-09-27 DIAGNOSIS — E1159 Type 2 diabetes mellitus with other circulatory complications: Secondary | ICD-10-CM | POA: Diagnosis not present

## 2016-09-27 DIAGNOSIS — Z794 Long term (current) use of insulin: Secondary | ICD-10-CM

## 2016-09-27 NOTE — Progress Notes (Signed)
Subjective:    Patient ID: Gabrielle Wood, female    DOB: 1946/05/20,    Past Medical History:  Diagnosis Date  . Arthritis   . COPD (chronic obstructive pulmonary disease) (HCC)   . Diabetes mellitus   . DVT (deep venous thrombosis) (HCC)    years ago  . GERD (gastroesophageal reflux disease)   . High cholesterol   . Hypertension   . Hypothyroidism   . Thyroid disease    Past Surgical History:  Procedure Laterality Date  . BACK SURGERY     lumbar  . ESOPHAGOGASTRODUODENOSCOPY  04/04/2012   Moderate gastritis/MODERATE Duodenitis in the duodenal bulb duodenum  . EUS  05/14/2012   Normal pancreas, gallbladder,biliary tree/ Incidental finding of simple-appearing right renal cyst.  . TONSILLECTOMY    . TUBAL LIGATION    . VEIN SURGERY     stripping years ago but recent laser surgery; bilateral legs   Social History   Social History  . Marital status: Widowed    Spouse name: N/A  . Number of children: 2  . Years of education: N/A   Occupational History  . retired, Advertising copywriter at [employer]    Social History Main Topics  . Smoking status: Current Some Day Smoker    Packs/day: 0.50  . Smokeless tobacco: Never Used  . Alcohol use No  . Drug use: No  . Sexual activity: Not Asked   Other Topics Concern  . None   Social History Narrative  . None   Outpatient Encounter Prescriptions as of 09/27/2016  Medication Sig  . Insulin Lispro Prot & Lispro (HUMALOG MIX 75/25 KWIKPEN [place]) Inject 20 Units into the skin 2 (two) times daily.  [name] ALPRAZolam (XANAX) 0.5 MG tablet Take 0.5 mg by mouth 2 (two) times daily.  [name] atenolol (TENORMIN) 25 MG tablet Take 25 mg by mouth daily.   . Biotin 1000 MCG tablet Take 1,000 mcg by mouth daily.  . Cholecalciferol (VITAMIN D3) 1000 UNITS CAPS Take by mouth daily.  . CRESTOR 20 MG tablet Take 20 mg by mouth daily.   . fish oil-omega-3 fatty acids 1000 MG capsule Take 2 g by mouth daily.  [name] gabapentin (NEURONTIN) 100 MG capsule Take 100 mg by  mouth 3 (three) times daily.  [name] HYDROcodone-acetaminophen (LORTAB) 10-500 MG per tablet Take 1 tablet by mouth every 6 (six) hours as needed for pain. For pain  . ibuprofen (ADVIL,MOTRIN) 800 MG tablet Take 800 mg by mouth every 6 (six) hours as needed.   [name] levothyroxine (SYNTHROID, LEVOTHROID) 137 MCG tablet Take 137 mcg by mouth daily before breakfast.  . losartan (COZAAR) 25 MG tablet Take 25 mg by mouth daily.  . metFORMIN (GLUCOPHAGE) 500 MG tablet TAKE ONE TABLET BY MOUTH TWICE DAILY  . ondansetron (ZOFRAN) 4 MG tablet Take 1 tablet (4 mg total) by mouth every 6 (six) hours as needed for nausea. For nausea  . pantoprazole (PROTONIX) 40 MG tablet Take 1 tablet (40 mg total) by mouth 2 (two) times daily.  . [DISCONTINUED] insulin lispro protamine-lispro (HUMALOG 75/25 MIX) (75-25) 100 UNIT/ML SUSP injection Inject 20 Units into the skin 2 (two) times daily with a meal. Inject 30 units of insulin with breakfast and 30 units of insulin with supper when blood glucose is above 90 mg/dL.   No facility-administered encounter medications on file as of 09/27/2016.    ALLERGIES: Allergies  Allergen Reactions  . Ciprocin-Fluocin-Procin [Fluocinolone]     Nightmares  . Lipitor [Atorvastatin]  Other (See Comments)    Muscle aches, pain  . Red Yeast Rice [Cholestin] Itching and Rash   VACCINATION STATUS: Immunization History  Administered Date(s) Administered  . Pneumococcal Polysaccharide-23 04/04/2012    Diabetes  She presents for her follow-up diabetic visit. She has type 2 diabetes mellitus. Onset time: She was diagnosed at approximate age of 78 years. Her disease course has been improving. There are no hypoglycemic associated symptoms. Pertinent negatives for hypoglycemia include no confusion, headaches, pallor or seizures. Associated symptoms include fatigue. Pertinent negatives for diabetes include no chest pain, no polydipsia, no polyphagia and no polyuria. There are no hypoglycemic  complications. Symptoms are improving. Diabetic complications include heart disease. Risk factors for coronary artery disease include dyslipidemia, hypertension, sedentary lifestyle and tobacco exposure. Current diabetic treatment includes insulin injections and oral agent (monotherapy). She is compliant with treatment most of the time. Her weight is stable. She is following a generally unhealthy diet. When asked about meal planning, she reported none. She has had a previous visit with a dietitian. She participates in exercise intermittently. There is no change in her home blood glucose trend. Her breakfast blood glucose range is generally 140-180 mg/dl. Her dinner blood glucose range is generally 180-200 mg/dl. Her overall blood glucose range is 180-200 mg/dl. An ACE inhibitor/angiotensin II receptor blocker is being taken. Eye exam is current.  Hyperlipidemia  This is a chronic problem. The current episode started more than 1 year ago. Pertinent negatives include no chest pain, myalgias or shortness of breath. Current antihyperlipidemic treatment includes statins.  Hypertension  This is a chronic problem. The current episode started more than 1 year ago. Pertinent negatives include no chest pain, headaches, palpitations or shortness of breath. Past treatments include alpha 1 blockers.     Review of Systems  Constitutional: Positive for fatigue. Negative for unexpected weight change.       She is trying to quit smoking, wears nicotine patch.  HENT: Negative for trouble swallowing and voice change.   Eyes: Negative for visual disturbance.  Respiratory: Negative for cough, shortness of breath and wheezing.   Cardiovascular: Negative for chest pain, palpitations and leg swelling.  Gastrointestinal: Negative for diarrhea, nausea and vomiting.  Endocrine: Negative for cold intolerance, heat intolerance, polydipsia, polyphagia and polyuria.  Musculoskeletal: Negative for arthralgias and myalgias.  Skin:  Negative for color change, pallor, rash and wound.  Neurological: Negative for seizures and headaches.  Psychiatric/Behavioral: Negative for confusion and suicidal ideas.    Objective:    BP 130/74   Pulse 71   Ht 5\' 6"  (1.676 m)   Wt 148 lb (67.1 kg)   BMI 23.89 kg/m   Wt Readings from Last 3 Encounters:  09/27/16 148 lb (67.1 kg)  06/21/16 150 lb (68 kg)  03/19/16 154 lb (69.9 kg)    Physical Exam  Constitutional: She is oriented to person, place, and time. She appears well-developed.  HENT:  Head: Normocephalic and atraumatic.  Eyes: EOM are normal.  Neck: Normal range of motion. Neck supple. No tracheal deviation present. No thyromegaly present.  Cardiovascular: Normal rate and regular rhythm.   Pulses:      Dorsalis pedis pulses are 1+ on the right side, and 1+ on the left side.       Posterior tibial pulses are 1+ on the right side, and 1+ on the left side.  Pulmonary/Chest: Effort normal and breath sounds normal.  Abdominal: Soft. Bowel sounds are normal. There is no tenderness. There is no guarding.  Musculoskeletal: Normal range of motion. She exhibits no edema.  Neurological: She is alert and oriented to person, place, and time. She has normal reflexes. No cranial nerve deficit. Coordination normal.  Skin: Skin is warm and dry. No rash noted. No erythema. No pallor.  Psychiatric: She has a normal mood and affect. Judgment normal.    Results for orders placed or performed in visit on 09/20/16  COMPLETE METABOLIC PANEL WITH GFR  Result Value Ref Range   Sodium 136 135 - 146 mmol/L   Potassium 4.5 3.5 - 5.3 mmol/L   Chloride 103 98 - 110 mmol/L   CO2 22 20 - 31 mmol/L   Glucose, Bld 296 (H) 65 - 99 mg/dL   BUN 21 7 - 25 mg/dL   Creat 8.11 9.14 - 7.82 mg/dL   Total Bilirubin 0.5 0.2 - 1.2 mg/dL   Alkaline Phosphatase 43 33 - 130 U/L   AST 13 10 - 35 U/L   ALT 10 6 - 29 U/L   Total Protein 6.4 6.1 - 8.1 g/dL   Albumin 4.2 3.6 - 5.1 g/dL   Calcium 9.6 8.6 -  95.6 mg/dL   GFR, Est African American 88 >=60 mL/min   GFR, Est Non African American 77 >=60 mL/min  TSH  Result Value Ref Range   TSH 1.91 mIU/L  T4, free  Result Value Ref Range   Free T4 1.5 0.8 - 1.8 ng/dL  Hemoglobin O1H  Result Value Ref Range   Hgb A1c MFr Bld 7.7 (H) <5.7 %   Mean Plasma Glucose 174 mg/dL   Complete Blood Count (Most recent): Diabetic Labs (most recent): Lab Results  Component Value Date   HGBA1C 7.7 (H) 09/20/2016   HGBA1C 8.0 (H) 06/14/2016   HGBA1C 7.6 (H) 03/12/2016      Assessment & Plan:   1. Uncontrolled type 2 diabetes mellitus with other circulatory complication, with long-term current use of insulin (HCC)  Patient came with  Overall improvement in her glycemic profile, and  recent A1c  Improving to 7.7%. - Due to recent decrease in and does of her Humalog 75/25-20 units twice a day, she saw blood glucose readings in 200s over the last 5 days.   Glucose logs and insulin administration records pertaining to this visit,  to be scanned into patient's records.  Recent labs reviewed. - Patient remains at a high risk for more acute and chronic complications of diabetes which include CAD, CVA, CKD, retinopathy, and neuropathy. These are all discussed in detail with the patient.  - I have re-counseled the patient on diet management and weight loss  by adopting a carbohydrate restricted / protein rich  Diet. - Patient is advised to stick to a routine mealtimes to eat 3 meals  a day and avoid unnecessary snacks ( to snack only to correct hypoglycemia).  - Suggestion is made for patient to avoid simple carbohydrates   from their diet including Cakes , Desserts, Ice Cream,  Soda (  diet and regular) , Sweet Tea , Candies,  Chips, Cookies, Artificial Sweeteners,   and "Sugar-free" Products .  This will help patient to have stable blood glucose profile and potentially avoid unintended  Weight gain.  - The patient  has been and waiting for a  scheduled visit  with Norm Salt, RDN, CDE for individualized DM education. - I have approached patient with the following individualized plan to manage diabetes and patient agrees.  - I advised her to increase premixed insulin Humalog  75/25  Back to 30 units with breakfast and increased to 30 units at supper for pre-meal blood glucose above 90 mg/dL associated with strict monitoring of glucose  qAC and HS. - Patient is warned not to take insulin without proper monitoring per orders. -Adjustment parameters are given for hypo and hyperglycemia in writing. -Patient is encouraged to call clinic for blood glucose levels less than 70 or above 300 mg /dl. - I will continue metformin 500 mg by mouth twice a day, therapeutically suitable for patient.   - Patient will be considered for incretin therapy as appropriate next visit. - Patient specific target  for A1c; LDL, HDL, Triglycerides, and  Waist Circumference were discussed in detail.  2) BP/HTN: Controlled. Continue current medications including ARB-losartan 25 mg by mouth q day. 3) Lipids/HPL:  continue statins. 4)  Weight/Diet: CDE consult in progress, exercise, and carbohydrates information provided.  5) Hypothyroidism: -  Her thyroid function tests are consistent with appropriate replacement. - I advised her to continue levothyroxine 137 g by mouth every morning.   - We discussed about correct intake of levothyroxine, at fasting, with water, separated by at least 30 minutes from breakfast, and separated by more than 4 hours from calcium, iron, multivitamins, acid reflux medications (PPIs). -Patient is made aware of the fact that thyroid hormone replacement is needed for life, dose to be adjusted by periodic monitoring of thyroid function tests.  6) Tobacco abuse counseling -After extensive counseling, she is in the process of smoking cessation. She wears nicotine patch currently.  7) Chronic Care/Health Maintenance:  -Patient on ARB and Statin  medications and encouraged to continue to follow up with Ophthalmology, Podiatrist at least yearly or according to recommendations, and advised to stay away from smoking. I have recommended yearly flu vaccine and pneumonia vaccination at least every 5 years; moderate intensity exercise for up to 150 minutes weekly; and  sleep for at least 7 hours a day.  I advised patient to maintain close follow up with her PCP for primary care needs.  Patient is asked to bring meter and  blood glucose logs during their next visit.   Follow up plan: Return in about 3 months (around 12/28/2016) for follow up with pre-visit labs, meter, and logs.  Marquis LunchGebre Nida, MD Phone: [phone]  Fax: [phone]   09/27/2016, 11:25 AM

## 2016-11-12 ENCOUNTER — Ambulatory Visit (INDEPENDENT_AMBULATORY_CARE_PROVIDER_SITE_OTHER): Payer: Medicare Other | Admitting: Otolaryngology

## 2016-11-12 ENCOUNTER — Other Ambulatory Visit: Payer: Self-pay | Admitting: "Endocrinology

## 2016-11-12 DIAGNOSIS — H903 Sensorineural hearing loss, bilateral: Secondary | ICD-10-CM | POA: Diagnosis not present

## 2016-11-14 ENCOUNTER — Telehealth: Payer: Self-pay

## 2016-11-14 NOTE — Telephone Encounter (Signed)
Advise patient to increase Humalog 75/25- to 40 units with breakfast, but keep it at 30 units with supper.

## 2016-11-14 NOTE — Telephone Encounter (Signed)
Pt.notified

## 2016-11-14 NOTE — Telephone Encounter (Signed)
Pt states she has had high BG readings since having a sinus infection and being put on antibiotics   Date Before breakfast Before lunch Before supper Bedtime  12/11 241  262 246  12/12 174  283 118  12/13 293             Pt taking:  Humalog 75/25 30 units bid, MTF 500 mg bid

## 2017-01-01 ENCOUNTER — Ambulatory Visit: Payer: Medicare Other | Admitting: "Endocrinology

## 2017-01-04 ENCOUNTER — Other Ambulatory Visit: Payer: Self-pay | Admitting: "Endocrinology

## 2017-01-05 LAB — COMPREHENSIVE METABOLIC PANEL
A/G RATIO: 1.9 (ref 1.2–2.2)
ALBUMIN: 4.2 g/dL (ref 3.5–4.8)
ALK PHOS: 48 IU/L (ref 39–117)
ALT: 8 IU/L (ref 0–32)
AST: 12 IU/L (ref 0–40)
BILIRUBIN TOTAL: 0.4 mg/dL (ref 0.0–1.2)
BUN / CREAT RATIO: 19 (ref 12–28)
BUN: 15 mg/dL (ref 8–27)
CHLORIDE: 100 mmol/L (ref 96–106)
CO2: 23 mmol/L (ref 18–29)
Calcium: 9.4 mg/dL (ref 8.7–10.3)
Creatinine, Ser: 0.78 mg/dL (ref 0.57–1.00)
GFR calc non Af Amer: 77 mL/min/{1.73_m2} (ref >59)
GFR, EST AFRICAN AMERICAN: 88 mL/min/{1.73_m2} (ref >59)
Globulin, Total: 2.2 g/dL (ref 1.5–4.5)
Glucose: 191 mg/dL — ABNORMAL HIGH (ref 65–99)
POTASSIUM: 3.9 mmol/L (ref 3.5–5.2)
Sodium: 139 mmol/L (ref 134–144)
TOTAL PROTEIN: 6.4 g/dL (ref 6.0–8.5)

## 2017-01-05 LAB — HGB A1C W/O EAG: Hgb A1c MFr Bld: 8.5 % — ABNORMAL HIGH (ref 4.8–5.6)

## 2017-01-15 ENCOUNTER — Encounter: Payer: Self-pay | Admitting: "Endocrinology

## 2017-01-15 ENCOUNTER — Ambulatory Visit (INDEPENDENT_AMBULATORY_CARE_PROVIDER_SITE_OTHER): Payer: Medicare Other | Admitting: "Endocrinology

## 2017-01-15 ENCOUNTER — Ambulatory Visit: Payer: Medicare Other | Admitting: "Endocrinology

## 2017-01-15 VITALS — BP 135/83 | HR 71 | Ht 66.0 in | Wt 143.0 lb

## 2017-01-15 DIAGNOSIS — Z794 Long term (current) use of insulin: Secondary | ICD-10-CM

## 2017-01-15 DIAGNOSIS — E1165 Type 2 diabetes mellitus with hyperglycemia: Secondary | ICD-10-CM

## 2017-01-15 DIAGNOSIS — I10 Essential (primary) hypertension: Secondary | ICD-10-CM | POA: Diagnosis not present

## 2017-01-15 DIAGNOSIS — E782 Mixed hyperlipidemia: Secondary | ICD-10-CM | POA: Diagnosis not present

## 2017-01-15 DIAGNOSIS — E1159 Type 2 diabetes mellitus with other circulatory complications: Secondary | ICD-10-CM | POA: Diagnosis not present

## 2017-01-15 DIAGNOSIS — E038 Other specified hypothyroidism: Secondary | ICD-10-CM

## 2017-01-15 DIAGNOSIS — IMO0002 Reserved for concepts with insufficient information to code with codable children: Secondary | ICD-10-CM

## 2017-01-15 NOTE — Patient Instructions (Signed)

## 2017-01-15 NOTE — Progress Notes (Signed)
Subjective:    Patient ID: Gabrielle Wood, female    DOB: 01-Sep-1946,    Past Medical History:  Diagnosis Date  . Arthritis   . COPD (chronic obstructive pulmonary disease) (HCC)   . Diabetes mellitus   . DVT (deep venous thrombosis) (HCC)    years ago  . GERD (gastroesophageal reflux disease)   . High cholesterol   . Hypertension   . Hypothyroidism   . Thyroid disease    Past Surgical History:  Procedure Laterality Date  . BACK SURGERY     lumbar  . ESOPHAGOGASTRODUODENOSCOPY  04/04/2012   Moderate gastritis/MODERATE Duodenitis in the duodenal bulb duodenum  . EUS  05/14/2012   Normal pancreas, gallbladder,biliary tree/ Incidental finding of simple-appearing right renal cyst.  . TONSILLECTOMY    . TUBAL LIGATION    . VEIN SURGERY     stripping years ago but recent laser surgery; bilateral legs   Social History   Social History  . Marital status: Widowed    Spouse name: N/A  . Number of children: 2  . Years of education: N/A   Occupational History  . retired, Advertising copywriter at [employer]    Social History Main Topics  . Smoking status: Current Some Day Smoker    Packs/day: 0.50  . Smokeless tobacco: Never Used  . Alcohol use No  . Drug use: No  . Sexual activity: Not Asked   Other Topics Concern  . None   Social History Narrative  . None   Outpatient Encounter Prescriptions as of 01/15/2017  Medication Sig  . ALPRAZolam (XANAX) 0.5 MG tablet Take 0.5 mg by mouth 2 (two) times daily.  [name] atenolol (TENORMIN) 25 MG tablet Take 25 mg by mouth daily.   . Biotin 1000 MCG tablet Take 1,000 mcg by mouth daily.  . Cholecalciferol (VITAMIN D3) 1000 UNITS CAPS Take by mouth daily.  . CRESTOR 20 MG tablet Take 20 mg by mouth daily.   . fish oil-omega-3 fatty acids 1000 MG capsule Take 2 g by mouth daily.  [name] gabapentin (NEURONTIN) 100 MG capsule Take 100 mg by mouth 3 (three) times daily.  [name] HYDROcodone-acetaminophen (LORTAB) 10-500 MG per tablet Take 1 tablet by mouth  every 6 (six) hours as needed for pain. For pain  . ibuprofen (ADVIL,MOTRIN) 800 MG tablet Take 800 mg by mouth every 6 (six) hours as needed.   . Insulin Lispro Prot & Lispro (HUMALOG MIX 75/25 KWIKPEN [place]) Inject 35 Units into the skin 2 (two) times daily before a meal.  . levothyroxine (SYNTHROID, LEVOTHROID) 137 MCG tablet Take 137 mcg by mouth daily before breakfast.  . losartan (COZAAR) 25 MG tablet Take 25 mg by mouth daily.  . metFORMIN (GLUCOPHAGE) 500 MG tablet TAKE ONE TABLET BY MOUTH TWICE DAILY  . ondansetron (ZOFRAN) 4 MG tablet Take 1 tablet (4 mg total) by mouth every 6 (six) hours as needed for nausea. For nausea  . pantoprazole (PROTONIX) 40 MG tablet Take 1 tablet (40 mg total) by mouth 2 (two) times daily.   No facility-administered encounter medications on file as of 01/15/2017.    ALLERGIES: Allergies  Allergen Reactions  . Ciprocin-Fluocin-Procin [Fluocinolone]     Nightmares  . Lipitor [Atorvastatin] Other (See Comments)    Muscle aches, pain  . Red Yeast Rice [Cholestin] Itching and Rash   VACCINATION STATUS: Immunization History  Administered Date(s) Administered  . Pneumococcal Polysaccharide-23 04/04/2012    Diabetes  She presents for her follow-up diabetic visit.  She has type 2 diabetes mellitus. Onset time: She was diagnosed at approximate age of 27 years. Her disease course has been worsening. There are no hypoglycemic associated symptoms. Pertinent negatives for hypoglycemia include no confusion, headaches, pallor or seizures. Associated symptoms include fatigue. Pertinent negatives for diabetes include no chest pain, no polydipsia, no polyphagia and no polyuria. There are no hypoglycemic complications. Symptoms are worsening. Diabetic complications include heart disease. Risk factors for coronary artery disease include dyslipidemia, hypertension, sedentary lifestyle and tobacco exposure. Current diabetic treatment includes insulin injections and oral agent  (monotherapy). She is compliant with treatment most of the time. Her weight is decreasing steadily. She is following a generally unhealthy diet. When asked about meal planning, she reported none. She has had a previous visit with a dietitian. She participates in exercise intermittently. There is no change in her home blood glucose trend. Her breakfast blood glucose range is generally 140-180 mg/dl. Her dinner blood glucose range is generally 180-200 mg/dl. Her overall blood glucose range is 180-200 mg/dl. An ACE inhibitor/angiotensin II receptor blocker is being taken. Eye exam is current.  Hyperlipidemia  This is a chronic problem. The current episode started more than 1 year ago. Pertinent negatives include no chest pain, myalgias or shortness of breath. Current antihyperlipidemic treatment includes statins.  Hypertension  This is a chronic problem. The current episode started more than 1 year ago. Pertinent negatives include no chest pain, headaches, palpitations or shortness of breath. Past treatments include alpha 1 blockers.     Review of Systems  Constitutional: Positive for fatigue. Negative for unexpected weight change.       She is trying to quit smoking, wears nicotine patch.  HENT: Negative for trouble swallowing and voice change.   Eyes: Negative for visual disturbance.  Respiratory: Negative for cough, shortness of breath and wheezing.   Cardiovascular: Negative for chest pain, palpitations and leg swelling.  Gastrointestinal: Negative for diarrhea, nausea and vomiting.  Endocrine: Negative for cold intolerance, heat intolerance, polydipsia, polyphagia and polyuria.  Musculoskeletal: Negative for arthralgias and myalgias.  Skin: Negative for color change, pallor, rash and wound.  Neurological: Negative for seizures and headaches.  Psychiatric/Behavioral: Negative for confusion and suicidal ideas.    Objective:    BP 135/83   Pulse 71   Ht 5\' 6"  (1.676 m)   Wt 143 lb (64.9  kg)   BMI 23.08 kg/m   Wt Readings from Last 3 Encounters:  01/15/17 143 lb (64.9 kg)  09/27/16 148 lb (67.1 kg)  06/21/16 150 lb (68 kg)    Physical Exam  Constitutional: She is oriented to person, place, and time. She appears well-developed.  HENT:  Head: Normocephalic and atraumatic.  Eyes: EOM are normal.  Neck: Normal range of motion. Neck supple. No tracheal deviation present. No thyromegaly present.  Cardiovascular: Normal rate and regular rhythm.   Pulses:      Dorsalis pedis pulses are 1+ on the right side, and 1+ on the left side.       Posterior tibial pulses are 1+ on the right side, and 1+ on the left side.  Pulmonary/Chest: Effort normal and breath sounds normal.  Abdominal: Soft. Bowel sounds are normal. There is no tenderness. There is no guarding.  Musculoskeletal: Normal range of motion. She exhibits no edema.  Neurological: She is alert and oriented to person, place, and time. She has normal reflexes. No cranial nerve deficit. Coordination normal.  Skin: Skin is warm and dry. No rash noted. No erythema.  No pallor.  Psychiatric: She has a normal mood and affect. Judgment normal.    Results for orders placed or performed in visit on 01/04/17  Comprehensive metabolic panel  Result Value Ref Range   Glucose 191 (H) 65 - 99 mg/dL   BUN 15 8 - 27 mg/dL   Creatinine, Ser 5.620.78 0.57 - 1.00 mg/dL   GFR calc non Af Amer 77 >59 mL/min/1.73   GFR calc Af Amer 88 >59 mL/min/1.73   BUN/Creatinine Ratio 19 12 - 28   Sodium 139 134 - 144 mmol/L   Potassium 3.9 3.5 - 5.2 mmol/L   Chloride 100 96 - 106 mmol/L   CO2 23 18 - 29 mmol/L   Calcium 9.4 8.7 - 10.3 mg/dL   Total Protein 6.4 6.0 - 8.5 g/dL   Albumin 4.2 3.5 - 4.8 g/dL   Globulin, Total 2.2 1.5 - 4.5 g/dL   Albumin/Globulin Ratio 1.9 1.2 - 2.2   Bilirubin Total 0.4 0.0 - 1.2 mg/dL   Alkaline Phosphatase 48 39 - 117 IU/L   AST 12 0 - 40 IU/L   ALT 8 0 - 32 IU/L  Hgb A1c w/o eAG  Result Value Ref Range   Hgb A1c  MFr Bld 8.5 (H) 4.8 - 5.6 %   Complete Blood Count (Most recent): Diabetic Labs (most recent): Lab Results  Component Value Date   HGBA1C 8.5 (H) 01/04/2017   HGBA1C 7.7 (H) 09/20/2016   HGBA1C 8.0 (H) 06/14/2016      Assessment & Plan:   1. Uncontrolled type 2 diabetes mellitus with other circulatory complication, with long-term current use of insulin (HCC)  Patient came with  Overall improvement in her glycemic profile, and  recent A1c  8.5% increasing from 7.7%. -   She made her own adjustment on insulin, skipped her insulin when she was not supposed to.    Glucose logs and insulin administration records pertaining to this visit,  to be scanned into patient's records.  Recent labs reviewed. - Patient remains at a high risk for more acute and chronic complications of diabetes which include CAD, CVA, CKD, retinopathy, and neuropathy. These are all discussed in detail with the patient.  - I have re-counseled the patient on diet management and weight loss  by adopting a carbohydrate restricted / protein rich  Diet. - Patient is advised to stick to a routine mealtimes to eat 3 meals  a day and avoid unnecessary snacks ( to snack only to correct hypoglycemia).  - Suggestion is made for patient to avoid simple carbohydrates   from their diet including Cakes , Desserts, Ice Cream,  Soda (  diet and regular) , Sweet Tea , Candies,  Chips, Cookies, Artificial Sweeteners,   and "Sugar-free" Products .  This will help patient to have stable blood glucose profile and potentially avoid unintended  Weight gain.  - The patient  has been and waiting for a  scheduled visit with Norm SaltPenny Crumpton, RDN, CDE for individualized DM education. - I have approached patient with the following individualized plan to manage diabetes and patient agrees.  - I advised her to increase premixed insulin Humalog 75/25  Back to 35 units with breakfast and increased to 35 units at supper for pre-meal blood glucose above 90  mg/dL associated with strict monitoring of glucose  qAC and HS. - Patient is warned not to take insulin without proper monitoring per orders. -Adjustment parameters are given for hypo and hyperglycemia in writing. -Patient is encouraged to  call clinic for blood glucose levels less than 70 or above 300 mg /dl. - I will continue metformin 500 mg by mouth twice a day, therapeutically suitable for patient.   - Patient will be considered for incretin therapy as appropriate next visit. - Patient specific target  for A1c; LDL, HDL, Triglycerides, and  Waist Circumference were discussed in detail.  2) BP/HTN: Controlled. Continue current medications including ARB-losartan 25 mg by mouth q day. 3) Lipids/HPL:  continue statins. 4)  Weight/Diet: CDE consult in progress, exercise, and carbohydrates information provided.  5) Hypothyroidism: -  Her thyroid function tests are consistent with appropriate replacement. - I advised her to continue levothyroxine 137 g by mouth every morning.   - We discussed about correct intake of levothyroxine, at fasting, with water, separated by at least 30 minutes from breakfast, and separated by more than 4 hours from calcium, iron, multivitamins, acid reflux medications (PPIs). -Patient is made aware of the fact that thyroid hormone replacement is needed for life, dose to be adjusted by periodic monitoring of thyroid function tests.  6) Tobacco abuse counseling -After extensive counseling, she is in the process of smoking cessation. She wears nicotine patch currently.  7) Chronic Care/Health Maintenance:  -Patient on ARB and Statin medications and encouraged to continue to follow up with Ophthalmology, Podiatrist at least yearly or according to recommendations, and advised to stay away from smoking. I have recommended yearly flu vaccine and pneumonia vaccination at least every 5 years; moderate intensity exercise for up to 150 minutes weekly; and  sleep for at least 7  hours a day.  I advised patient to maintain close follow up with her PCP for primary care needs.  Patient is asked to bring meter and  blood glucose logs during their next visit.   Follow up plan: Return in about 3 months (around 04/14/2017) for follow up with pre-visit labs, meter, and logs.  Marquis Lunch, MD Phone: [phone]  Fax: [phone]   01/15/2017, 11:21 AM

## 2017-02-12 ENCOUNTER — Other Ambulatory Visit: Payer: Self-pay

## 2017-02-12 MED ORDER — METFORMIN HCL 500 MG PO TABS: 500.0000 mg | ORAL_TABLET | Freq: Two times a day (BID) | ORAL | 0 refills | Status: DC

## 2017-04-11 ENCOUNTER — Other Ambulatory Visit: Payer: Self-pay | Admitting: "Endocrinology

## 2017-04-12 LAB — COMPREHENSIVE METABOLIC PANEL
ALT: 11 IU/L (ref 0–32)
AST: 17 IU/L (ref 0–40)
Albumin/Globulin Ratio: 1.9 (ref 1.2–2.2)
Albumin: 4.3 g/dL (ref 3.5–4.8)
Alkaline Phosphatase: 50 IU/L (ref 39–117)
BILIRUBIN TOTAL: 0.3 mg/dL (ref 0.0–1.2)
BUN/Creatinine Ratio: 19 (ref 12–28)
BUN: 16 mg/dL (ref 8–27)
CHLORIDE: 101 mmol/L (ref 96–106)
CO2: 23 mmol/L (ref 18–29)
Calcium: 9.9 mg/dL (ref 8.7–10.3)
Creatinine, Ser: 0.83 mg/dL (ref 0.57–1.00)
GFR, EST AFRICAN AMERICAN: 82 mL/min/{1.73_m2} (ref >59)
GFR, EST NON AFRICAN AMERICAN: 71 mL/min/{1.73_m2} (ref >59)
GLUCOSE: 152 mg/dL — AB (ref 65–99)
Globulin, Total: 2.3 g/dL (ref 1.5–4.5)
Potassium: 4.5 mmol/L (ref 3.5–5.2)
Sodium: 138 mmol/L (ref 134–144)
TOTAL PROTEIN: 6.6 g/dL (ref 6.0–8.5)

## 2017-04-12 LAB — HGB A1C W/O EAG: Hgb A1c MFr Bld: 9.6 % — ABNORMAL HIGH (ref 4.8–5.6)

## 2017-04-18 ENCOUNTER — Ambulatory Visit (INDEPENDENT_AMBULATORY_CARE_PROVIDER_SITE_OTHER): Payer: Medicare Other | Admitting: "Endocrinology

## 2017-04-18 ENCOUNTER — Encounter: Payer: Self-pay | Admitting: "Endocrinology

## 2017-04-18 VITALS — BP 104/65 | HR 83 | Ht 66.0 in | Wt 145.0 lb

## 2017-04-18 DIAGNOSIS — E038 Other specified hypothyroidism: Secondary | ICD-10-CM

## 2017-04-18 DIAGNOSIS — I10 Essential (primary) hypertension: Secondary | ICD-10-CM

## 2017-04-18 DIAGNOSIS — Z794 Long term (current) use of insulin: Secondary | ICD-10-CM

## 2017-04-18 DIAGNOSIS — E1159 Type 2 diabetes mellitus with other circulatory complications: Secondary | ICD-10-CM | POA: Diagnosis not present

## 2017-04-18 DIAGNOSIS — E782 Mixed hyperlipidemia: Secondary | ICD-10-CM | POA: Diagnosis not present

## 2017-04-18 DIAGNOSIS — Z716 Tobacco abuse counseling: Secondary | ICD-10-CM

## 2017-04-18 DIAGNOSIS — IMO0002 Reserved for concepts with insufficient information to code with codable children: Secondary | ICD-10-CM

## 2017-04-18 DIAGNOSIS — E1165 Type 2 diabetes mellitus with hyperglycemia: Secondary | ICD-10-CM

## 2017-04-18 NOTE — Patient Instructions (Signed)

## 2017-04-18 NOTE — Progress Notes (Signed)
Subjective:    Patient ID: Gabrielle Wood, female    DOB: 03/01/1946,    Past Medical History:  Diagnosis Date  . Arthritis   . COPD (chronic obstructive pulmonary disease) (HCC)   . Diabetes mellitus   . DVT (deep venous thrombosis) (HCC)    years ago  . GERD (gastroesophageal reflux disease)   . High cholesterol   . Hypertension   . Hypothyroidism   . Thyroid disease    Past Surgical History:  Procedure Laterality Date  . BACK SURGERY     lumbar  . ESOPHAGOGASTRODUODENOSCOPY  04/04/2012   Moderate gastritis/MODERATE Duodenitis in the duodenal bulb duodenum  . EUS  05/14/2012   Normal pancreas, gallbladder,biliary tree/ Incidental finding of simple-appearing right renal cyst.  . TONSILLECTOMY    . TUBAL LIGATION    . VEIN SURGERY     stripping years ago but recent laser surgery; bilateral legs   Social History   Social History  . Marital status: Widowed    Spouse name: N/A  . Number of children: 2  . Years of education: N/A   Occupational History  . retired, Advertising copywriter at [employer]    Social History Main Topics  . Smoking status: Current Some Day Smoker    Packs/day: 0.50  . Smokeless tobacco: Never Used  . Alcohol use No  . Drug use: No  . Sexual activity: Not on file   Other Topics Concern  . Not on file   Social History Narrative  . No narrative on file   Outpatient Encounter Prescriptions as of 04/18/2017  Medication Sig  . ALPRAZolam (XANAX) 0.5 MG tablet Take 0.5 mg by mouth 2 (two) times daily.  [name] atenolol (TENORMIN) 25 MG tablet Take 25 mg by mouth daily.   . Biotin 1000 MCG tablet Take 1,000 mcg by mouth daily.  . Cholecalciferol (VITAMIN D3) 1000 UNITS CAPS Take by mouth daily.  . CRESTOR 20 MG tablet Take 20 mg by mouth daily.   . fish oil-omega-3 fatty acids 1000 MG capsule Take 2 g by mouth daily.  [name] gabapentin (NEURONTIN) 100 MG capsule Take 100 mg by mouth 3 (three) times daily.  [name] HYDROcodone-acetaminophen (LORTAB) 10-500 MG per tablet  Take 1 tablet by mouth every 6 (six) hours as needed for pain. For pain  . ibuprofen (ADVIL,MOTRIN) 800 MG tablet Take 800 mg by mouth every 6 (six) hours as needed.   . Insulin Lispro Prot & Lispro (HUMALOG MIX 75/25 KWIKPEN [place]) Inject 35-40 Units into the skin 2 (two) times daily before a meal. 40 units with breakfast and 35 units with supper  . levothyroxine (SYNTHROID, LEVOTHROID) 137 MCG tablet Take 137 mcg by mouth daily before breakfast.  . losartan (COZAAR) 25 MG tablet Take 25 mg by mouth daily.  . metFORMIN (GLUCOPHAGE) 500 MG tablet Take 1 tablet (500 mg total) by mouth 2 (two) times daily.  . ondansetron (ZOFRAN) 4 MG tablet Take 1 tablet (4 mg total) by mouth every 6 (six) hours as needed for nausea. For nausea  . pantoprazole (PROTONIX) 40 MG tablet Take 1 tablet (40 mg total) by mouth 2 (two) times daily.   No facility-administered encounter medications on file as of 04/18/2017.    ALLERGIES: Allergies  Allergen Reactions  . Ciprocin-Fluocin-Procin [Fluocinolone]     Nightmares  . Lipitor [Atorvastatin] Other (See Comments)    Muscle aches, pain  . Red Yeast Rice [Cholestin] Itching and Rash   VACCINATION STATUS: Immunization History  Administered Date(s) Administered  . Pneumococcal Polysaccharide-23 04/04/2012    Diabetes  She presents for her follow-up diabetic visit. She has type 2 diabetes mellitus. Onset time: She was diagnosed at approximate age of 51 years. Her disease course has been worsening. There are no hypoglycemic associated symptoms. Pertinent negatives for hypoglycemia include no confusion, headaches, pallor or seizures. Associated symptoms include fatigue. Pertinent negatives for diabetes include no chest pain, no polydipsia, no polyphagia and no polyuria. There are no hypoglycemic complications. Symptoms are worsening. Diabetic complications include heart disease. Risk factors for coronary artery disease include dyslipidemia, hypertension, sedentary  lifestyle and tobacco exposure. Current diabetic treatment includes insulin injections and oral agent (monotherapy). She is compliant with treatment most of the time. Her weight is stable. She is following a generally unhealthy diet. When asked about meal planning, she reported none. She has had a previous visit with a dietitian. She participates in exercise intermittently. There is no change in her home blood glucose trend. Her breakfast blood glucose range is generally 180-200 mg/dl. Her dinner blood glucose range is generally 180-200 mg/dl. Her overall blood glucose range is 180-200 mg/dl. An ACE inhibitor/angiotensin II receptor blocker is being taken. Eye exam is current.  Hyperlipidemia  This is a chronic problem. The current episode started more than 1 year ago. Pertinent negatives include no chest pain, myalgias or shortness of breath. Current antihyperlipidemic treatment includes statins.  Hypertension  This is a chronic problem. The current episode started more than 1 year ago. Pertinent negatives include no chest pain, headaches, palpitations or shortness of breath. Past treatments include alpha 1 blockers.     Review of Systems  Constitutional: Positive for fatigue. Negative for unexpected weight change.       She is trying to quit smoking, wears nicotine patch.  HENT: Negative for trouble swallowing and voice change.   Eyes: Negative for visual disturbance.  Respiratory: Negative for cough, shortness of breath and wheezing.   Cardiovascular: Negative for chest pain, palpitations and leg swelling.  Gastrointestinal: Negative for diarrhea, nausea and vomiting.  Endocrine: Negative for cold intolerance, heat intolerance, polydipsia, polyphagia and polyuria.  Musculoskeletal: Negative for arthralgias and myalgias.  Skin: Negative for color change, pallor, rash and wound.  Neurological: Negative for seizures and headaches.  Psychiatric/Behavioral: Negative for confusion and suicidal  ideas.    Objective:    BP 104/65   Pulse 83   Ht 5\' 6"  (1.676 m)   Wt 145 lb (65.8 kg)   BMI 23.40 kg/m   Wt Readings from Last 3 Encounters:  04/18/17 145 lb (65.8 kg)  01/15/17 143 lb (64.9 kg)  09/27/16 148 lb (67.1 kg)    Physical Exam  Constitutional: She is oriented to person, place, and time. She appears well-developed.  HENT:  Head: Normocephalic and atraumatic.  Eyes: EOM are normal.  Neck: Normal range of motion. Neck supple. No tracheal deviation present. No thyromegaly present.  Cardiovascular: Normal rate and regular rhythm.   Pulses:      Dorsalis pedis pulses are 1+ on the right side, and 1+ on the left side.       Posterior tibial pulses are 1+ on the right side, and 1+ on the left side.  Pulmonary/Chest: Effort normal and breath sounds normal.  Abdominal: Soft. Bowel sounds are normal. There is no tenderness. There is no guarding.  Musculoskeletal: Normal range of motion. She exhibits no edema.  Neurological: She is alert and oriented to person, place, and time. She has normal  reflexes. No cranial nerve deficit. Coordination normal.  Skin: Skin is warm and dry. No rash noted. No erythema. No pallor.  Psychiatric: She has a normal mood and affect. Judgment normal.    Results for orders placed or performed in visit on 04/11/17  Comprehensive metabolic panel  Result Value Ref Range   Glucose 152 (H) 65 - 99 mg/dL   BUN 16 8 - 27 mg/dL   Creatinine, Ser 6.29 0.57 - 1.00 mg/dL   GFR calc non Af Amer 71 >59 mL/min/1.73   GFR calc Af Amer 82 >59 mL/min/1.73   BUN/Creatinine Ratio 19 12 - 28   Sodium 138 134 - 144 mmol/L   Potassium 4.5 3.5 - 5.2 mmol/L   Chloride 101 96 - 106 mmol/L   CO2 23 18 - 29 mmol/L   Calcium 9.9 8.7 - 10.3 mg/dL   Total Protein 6.6 6.0 - 8.5 g/dL   Albumin 4.3 3.5 - 4.8 g/dL   Globulin, Total 2.3 1.5 - 4.5 g/dL   Albumin/Globulin Ratio 1.9 1.2 - 2.2   Bilirubin Total 0.3 0.0 - 1.2 mg/dL   Alkaline Phosphatase 50 39 - 117 IU/L    AST 17 0 - 40 IU/L   ALT 11 0 - 32 IU/L  Hgb A1c w/o eAG  Result Value Ref Range   Hgb A1c MFr Bld 9.6 (H) 4.8 - 5.6 %   Complete Blood Count (Most recent): Diabetic Labs (most recent): Lab Results  Component Value Date   HGBA1C 9.6 (H) 04/11/2017   HGBA1C 8.5 (H) 01/04/2017   HGBA1C 7.7 (H) 09/20/2016      Assessment & Plan:   1. Uncontrolled type 2 diabetes mellitus with other circulatory complication, with long-term current use of insulin (HCC)  Patient came with  Loss of control in  her glycemic profile, and  recent A1c increased to 9.6%, slowly increasing from 7.7%. - This is largely due to her exposure to steroids related to sinus infection and back surgery since her last visit.     Glucose logs and insulin administration records pertaining to this visit,  to be scanned into patient's records.  Recent labs reviewed. - Patient remains at a high risk for more acute and chronic complications of diabetes which include CAD, CVA, CKD, retinopathy, and neuropathy. These are all discussed in detail with the patient.  - I have re-counseled the patient on diet management and weight loss  by adopting a carbohydrate restricted / protein rich  Diet. - Patient is advised to stick to a routine mealtimes to eat 3 meals  a day and avoid unnecessary snacks ( to snack only to correct hypoglycemia).  - Suggestion is made for patient to avoid simple carbohydrates   from their diet including Cakes , Desserts, Ice Cream,  Soda (  diet and regular) , Sweet Tea , Candies,  Chips, Cookies, Artificial Sweeteners,   and "Sugar-free" Products .  This will help patient to have stable blood glucose profile and potentially avoid unintended  Weight gain.  - The patient  has been and waiting for a  scheduled visit with Norm Salt, RDN, CDE for individualized DM education. - I have approached patient with the following individualized plan to manage diabetes and patient agrees.  - I advised her to increase  premixed insulin Humalog 75/25   to 40  units with breakfast and  35 units at supper for pre-meal blood glucose above 90 mg/dL associated with strict monitoring of glucose  qAC and HS. -  Patient is warned not to take insulin without proper monitoring per orders. -Adjustment parameters are given for hypo and hyperglycemia in writing. -Patient is encouraged to call clinic for blood glucose levels less than 70 or above 300 mg /dl. - I will continue metformin 500 mg by mouth twice a day, therapeutically suitable for patient.   - Patient will be considered for incretin therapy as appropriate next visit. - Patient specific target  for A1c; LDL, HDL, Triglycerides, and  Waist Circumference were discussed in detail.  2) BP/HTN: Controlled. Continue current medications including ARB-losartan 25 mg by mouth q day. 3) Lipids/HPL:  continue statins. 4)  Weight/Diet: CDE consult in progress, exercise, and carbohydrates information provided.  5) Hypothyroidism: -  Her thyroid function tests are consistent with appropriate replacement. - I advised her to continue levothyroxine 137 g by mouth every morning.   - We discussed about correct intake of levothyroxine, at fasting, with water, separated by at least 30 minutes from breakfast, and separated by more than 4 hours from calcium, iron, multivitamins, acid reflux medications (PPIs). -Patient is made aware of the fact that thyroid hormone replacement is needed for life, dose to be adjusted by periodic monitoring of thyroid function tests.  6) Tobacco abuse counseling -After extensive counseling, she is in the process of smoking cessation. She wears nicotine patch currently.  7) Chronic Care/Health Maintenance:  -Patient on ARB and Statin medications and encouraged to continue to follow up with Ophthalmology, Podiatrist at least yearly or according to recommendations, and advised to stay away from smoking. I have recommended yearly flu vaccine and  pneumonia vaccination at least every 5 years; moderate intensity exercise for up to 150 minutes weekly; and  sleep for at least 7 hours a day.  I advised patient to maintain close follow up with her PCP for primary care needs.  Patient is asked to bring meter and  blood glucose logs during their next visit.   Follow up plan: Return in about 3 months (around 07/19/2017) for follow up with pre-visit labs, meter, and logs.  Marquis LunchGebre Yessica Putnam, MD Phone: [phone]  Fax: [phone]   04/18/2017, 10:54 AM

## 2017-05-28 ENCOUNTER — Encounter (INDEPENDENT_AMBULATORY_CARE_PROVIDER_SITE_OTHER): Payer: Self-pay | Admitting: *Deleted

## 2017-07-11 ENCOUNTER — Other Ambulatory Visit: Payer: Self-pay | Admitting: "Endocrinology

## 2017-07-18 LAB — CMP14+EGFR
ALBUMIN: 4.4 g/dL (ref 3.5–4.8)
ALT: 8 IU/L (ref 0–32)
AST: 13 IU/L (ref 0–40)
Albumin/Globulin Ratio: 1.9 (ref 1.2–2.2)
Alkaline Phosphatase: 56 IU/L (ref 39–117)
BUN / CREAT RATIO: 18 (ref 12–28)
BUN: 15 mg/dL (ref 8–27)
Bilirubin Total: 0.5 mg/dL (ref 0.0–1.2)
CALCIUM: 9.8 mg/dL (ref 8.7–10.3)
CHLORIDE: 100 mmol/L (ref 96–106)
CO2: 21 mmol/L (ref 20–29)
CREATININE: 0.84 mg/dL (ref 0.57–1.00)
GFR, EST AFRICAN AMERICAN: 80 mL/min/{1.73_m2} (ref >59)
GFR, EST NON AFRICAN AMERICAN: 70 mL/min/{1.73_m2} (ref >59)
GLUCOSE: 239 mg/dL — AB (ref 65–99)
Globulin, Total: 2.3 g/dL (ref 1.5–4.5)
Potassium: 4.1 mmol/L (ref 3.5–5.2)
Sodium: 139 mmol/L (ref 134–144)
TOTAL PROTEIN: 6.7 g/dL (ref 6.0–8.5)

## 2017-07-18 LAB — T4, FREE: Free T4: 1.53 ng/dL (ref 0.82–1.77)

## 2017-07-18 LAB — TSH: TSH: 19.24 u[IU]/mL — ABNORMAL HIGH (ref 0.450–4.500)

## 2017-07-19 ENCOUNTER — Ambulatory Visit: Payer: Medicare Other | Admitting: "Endocrinology

## 2018-05-30 ENCOUNTER — Encounter: Payer: Self-pay | Admitting: Gastroenterology

## 2018-08-27 ENCOUNTER — Ambulatory Visit: Payer: Medicare Other | Admitting: Nurse Practitioner

## 2018-08-27 ENCOUNTER — Other Ambulatory Visit: Payer: Self-pay | Admitting: *Deleted

## 2018-08-27 ENCOUNTER — Encounter: Payer: Self-pay | Admitting: Nurse Practitioner

## 2018-08-27 ENCOUNTER — Telehealth: Payer: Self-pay | Admitting: *Deleted

## 2018-08-27 ENCOUNTER — Encounter: Payer: Self-pay | Admitting: *Deleted

## 2018-08-27 DIAGNOSIS — Z860100 Personal history of colon polyps, unspecified: Secondary | ICD-10-CM | POA: Insufficient documentation

## 2018-08-27 DIAGNOSIS — R69 Illness, unspecified: Secondary | ICD-10-CM | POA: Diagnosis not present

## 2018-08-27 MED ORDER — NA SULFATE-K SULFATE-MG SULF 17.5-3.13-1.6 GM/177ML PO SOLN: 1.0000 | ORAL | 0 refills | Status: DC

## 2018-08-27 NOTE — Assessment & Plan Note (Signed)
The patient admits she has a history of colon polyps at some point.  Her last colonoscopy was 8 years ago, per the patient and primary care states she is due.  I am unable to locate a record due to the age of the report.  We will request for medical records.  At this point with a history of colon polyps we will proceed with repeat colonoscopy for surveillance.  We will adjust her diabetes medications and recommend to check her blood sugar 3 times a day and before bed the day before and use clear liquids to help with any blood sugar management.  Return for follow-up based on post procedure recommendations.  Proceed with colonoscopy on propofol/MAC with Dr. Oneida Alar in the near future. The risks, benefits, and alternatives have been discussed in detail with the patient. They state understanding and desire to proceed.   The patient is currently on Xanax and Neurontin.  No other anticoagulants, anxiolytics, chronic pain medications, or antidepressants.  We will plan for the procedure on propofol/MAC to promote adequate sedation.

## 2018-08-27 NOTE — Patient Instructions (Signed)
1. We will schedule your colonoscopy for you. 2. Further recommendations will be made after your procedure. 3. Return for follow-up based on recommendations made after your procedure. 4. Call us if you have any questions or concerns.  At [facility] we value your feedback. You may receive a survey about your visit today. Please share your experience as we strive to create trusting relationships with our patients to provide genuine, compassionate, quality care.  We appreciate your understanding and patience as we review any laboratory studies, imaging, and other diagnostic tests that are ordered as we care for you. Our office policy is 5 business days for review of these results, and any emergent or urgent results are addressed in a timely manner for your best interest. If you do not hear from our office in 1 week, please contact us.   We also encourage the use of MyChart, which contains your medical information for your review as well. If you are not enrolled in this feature, an access code is on this after visit summary for your convenience. Thank you for allowing [place] to be involved in your care.  It was great to meet you today!  I hope you have a great Fall!!

## 2018-08-27 NOTE — Telephone Encounter (Signed)
Pre-op scheduled for 10/15/18 at 9:00am. Letter mailed. LMOVM on cell # and called home # [phone].

## 2018-08-27 NOTE — Progress Notes (Signed)
Primary Care Physician:  Ignatius Specking, MD Primary Gastroenterologist:  Dr. Darrick Penna  Chief Complaint  Patient presents with  . Colonoscopy    HPI:   Gabrielle Wood is a 72 y.o. female who presents on referral from primary care for colonoscopy.  Phone/nurse triage was deferred to office visit due to medications.  Reviewed information provided with referral including office visit note dated 05/27/2018 which was a follow-up visit.  Per office visit note the patient is due for colonoscopy and was ultimately referred to our office.  Labs provided with the referral which were drawn on 05/27/2018 found essentially normal CBC, essentially normal CMP other than hyperglycemia.  TSH normal.  Free T4 normal.  Vitamin D level also normal.  No history of colonoscopy found in our system.  Today she states she had a colonoscopy about 8 years ago. She was told by her PCP that it's time for a new one. Her last TCS was done at [facility], possibly by Dr. Karilyn Cota. No note/record found in the system, will request from medical Records. She does not remember the results. She has had previous polyps before. Denies abdominal pain, N/V, hematochezia, melena, fever, chills, unintentional weight loss. Denies chest pain, dyspnea, dizziness, lightheadedness, syncope, near syncope. Denies any other upper or lower GI symptoms.  Past Medical History:  Diagnosis Date  . Arthritis   . COPD (chronic obstructive pulmonary disease) (HCC)   . Diabetes mellitus   . DVT (deep venous thrombosis) (HCC)    years ago  . GERD (gastroesophageal reflux disease)   . High cholesterol   . Hypertension   . Hypothyroidism   . Thyroid disease     Past Surgical History:  Procedure Laterality Date  . BACK SURGERY     lumbar  . ESOPHAGOGASTRODUODENOSCOPY  04/04/2012   Moderate gastritis/MODERATE Duodenitis in the duodenal bulb duodenum  . EUS  05/14/2012   Normal pancreas, gallbladder,biliary tree/ Incidental finding of  simple-appearing right renal cyst.  . TONSILLECTOMY    . TUBAL LIGATION    . VEIN SURGERY     stripping years ago but recent laser surgery; bilateral legs    Current Outpatient Medications  Medication Sig Dispense Refill  . ALPRAZolam (XANAX) 0.5 MG tablet Take 0.5 mg by mouth 2 (two) times daily.    [name] atenolol (TENORMIN) 25 MG tablet Take 25 mg by mouth daily.     . baclofen (LIORESAL) 10 MG tablet Take 1 tablet by mouth 2 (two) times daily as needed.  2  . Calcium Carbonate (CALCIUM 600 PO) Take by mouth daily.     . Cholecalciferol (VITAMIN D3) 5000 units CAPS Take by mouth daily.    [name] gabapentin (NEURONTIN) 100 MG capsule Take 100 mg by mouth 2 (two) times daily. Takes one capsule every morning and 2 every evening    . HUMALOG KWIKPEN 200 UNIT/ML SOPN Inject 14 Units into the skin 3 (three) times daily.  3  . HYDROcodone-acetaminophen (LORTAB) 10-500 MG per tablet Take 1 tablet by mouth every 6 (six) hours as needed for pain. For pain 20 tablet 0  . levothyroxine (SYNTHROID, LEVOTHROID) 137 MCG tablet Take 137 mcg by mouth daily before breakfast.    . loratadine (CLARITIN) 10 MG tablet Take 10 mg by mouth daily.  3  . losartan (COZAAR) 25 MG tablet Take 25 mg by mouth daily.    [name] losartan (COZAAR) 50 MG tablet Take 50 mg by mouth daily.    . metFORMIN (GLUCOPHAGE)  500 MG tablet TAKE 1 TABLET BY MOUTH TWICE DAILY (Patient taking differently: Take 1,000 mg by mouth daily. ) 180 tablet 0  . pantoprazole (PROTONIX) 40 MG tablet Take 1 tablet (40 mg total) by mouth 2 (two) times daily. (Patient taking differently: Take 40 mg by mouth daily. ) 60 tablet 2  . TRESIBA FLEXTOUCH 200 UNIT/ML SOPN Inject 28 Units into the skin at bedtime.  3   No current facility-administered medications for this visit.     Allergies as of 08/27/2018 - Review Complete 08/27/2018  Allergen Reaction Noted  . Ciprocin-fluocin-procin [fluocinolone]  09/26/2015  . Lipitor [atorvastatin] Other (See Comments)  04/03/2012  . Red yeast rice [cholestin] Itching and Rash 04/03/2012    Family History  Problem Relation Age of Onset  . Pancreatic cancer Mother        diagnosed at age 31s, died from heart attack  . Inflammatory bowel disease Brother        crohn's, his dgt deceased from colon cancer at age 63  . Colon cancer Neg Hx     Social History   Socioeconomic History  . Marital status: Widowed    Spouse name: Not on file  . Number of children: 2  . Years of education: Not on file  . Highest education level: Not on file  Occupational History  . Occupation: retired, Advertising copywriter at [employer]  Social Needs  . Financial resource strain: Not on file  . Food insecurity:    Worry: Not on file    Inability: Not on file  . Transportation needs:    Medical: Not on file    Non-medical: Not on file  Tobacco Use  . Smoking status: Former Smoker    Packs/day: 0.50  . Smokeless tobacco: Never Used  Substance and Sexual Activity  . Alcohol use: No  . Drug use: No  . Sexual activity: Not on file  Lifestyle  . Physical activity:    Days per week: Not on file    Minutes per session: Not on file  . Stress: Not on file  Relationships  . Social connections:    Talks on phone: Not on file    Gets together: Not on file    Attends religious service: Not on file    Active member of club or organization: Not on file    Attends meetings of clubs or organizations: Not on file    Relationship status: Not on file  . Intimate partner violence:    Fear of current or ex partner: Not on file    Emotionally abused: Not on file    Physically abused: Not on file    Forced sexual activity: Not on file  Other Topics Concern  . Not on file  Social History Narrative  . Not on file    Review of Systems: Complete ROS negative except as per HPI.    Physical Exam: BP (!) 155/93   Pulse 84   Temp (!) 97.3 F (36.3 C) (Oral)   Ht 5\' 7"  (1.702 m)   Wt 153 lb 3.2 oz (69.5 kg)   BMI 23.99 kg/m  General:    Alert and oriented. Pleasant and cooperative. Well-nourished and well-developed.  Eyes:  Without icterus, sclera clear and conjunctiva pink.  Ears:  Normal auditory acuity. Cardiovascular:  S1, S2 present without murmurs appreciated. Extremities without clubbing or edema. Respiratory:  Clear to auscultation bilaterally. No wheezes, rales, or rhonchi. No distress.  Gastrointestinal:  +BS, soft, non-tender and non-distended. No  HSM noted. No guarding or rebound. No masses appreciated.  Rectal:  Deferred  Musculoskalatal:  Symmetrical without gross deformities. Skin:  Intact without significant lesions or rashes. Neurologic:  Alert and oriented x4;  grossly normal neurologically. Psych:  Alert and cooperative. Normal mood and affect. Heme/Lymph/Immune: No excessive bruising noted.    08/27/2018 11:15 AM   Disclaimer: This note was dictated with voice recognition software. Similar sounding words can inadvertently be transcribed and may not be corrected upon review.

## 2018-08-27 NOTE — Assessment & Plan Note (Signed)
The patient was referred by primary care for colonoscopy, as per below.  The patient was brought in for an office visit due to polypharmacy.  She is currently on Xanax and Neurontin.  We will plan for augmented sedation with propofol/MAC to promote adequate sedation.  Return for follow-up based on post procedure recommendations.

## 2018-08-27 NOTE — Progress Notes (Signed)
CC'D TO PCP °

## 2018-09-22 ENCOUNTER — Ambulatory Visit (HOSPITAL_COMMUNITY)
Admission: RE | Admit: 2018-09-22 | Discharge: 2018-09-22 | Disposition: A | Discharge status: Home / Self Care | Payer: Medicare Other | Source: Ambulatory Visit | Attending: Nurse Practitioner | Admitting: Nurse Practitioner

## 2018-09-22 ENCOUNTER — Other Ambulatory Visit (HOSPITAL_COMMUNITY): Payer: Self-pay | Admitting: Nurse Practitioner

## 2018-09-22 DIAGNOSIS — M79604 Pain in right leg: Secondary | ICD-10-CM

## 2018-09-23 ENCOUNTER — Ambulatory Visit (HOSPITAL_COMMUNITY)
Admission: RE | Admit: 2018-09-23 | Discharge: 2018-09-23 | Disposition: A | Discharge status: Home / Self Care | Payer: Medicare Other | Source: Ambulatory Visit | Attending: Nurse Practitioner | Admitting: Nurse Practitioner

## 2018-09-23 DIAGNOSIS — M79604 Pain in right leg: Secondary | ICD-10-CM | POA: Insufficient documentation

## 2018-10-09 NOTE — Patient Instructions (Signed)
Gabrielle Wood  10/09/2018     @PREFPERIOPPHARMACY @   Your procedure is scheduled on  10/21/2018 .  Report to [facility] at  615   A.M.  Call this number if you have problems the morning of surgery:  [phone]   Remember:  Follow the diet and prep instructions given to you by Dr Evelina Dun office.                       Take these medicines the morning of surgery with A SIP OF WATER  Xanax (if needed), atenolol, gabapentin, hydrocodone ( if needed), levothyroxine, claritin, losartan, protonix. Take 1/2 dose of tresiba the night before your procedure.    Do not wear jewelry, make-up or nail polish.  Do not wear lotions, powders, or perfumes, or deodorant.  Do not shave 48 hours prior to surgery.  Men may shave face and neck.  Do not bring valuables to the hospital.  [facility] is not responsible for any belongings or valuables.  Contacts, dentures or bridgework may not be worn into surgery.  Leave your suitcase in the car.  After surgery it may be brought to your room.  For patients admitted to the hospital, discharge time will be determined by your treatment team.  Patients discharged the day of surgery will not be allowed to drive home.   Name and phone number of your driver:   family Special instructions:  DO NOT take any medications for diabetes the morning of your procedure.  Please read over the following fact sheets that you were given. Anesthesia Post-op Instructions and Care and Recovery After Surgery       Colonoscopy, Adult A colonoscopy is an exam to look at the large intestine. It is done to check for problems, such as:  Lumps (tumors).  Growths (polyps).  Swelling (inflammation).  Bleeding.  What happens before the procedure? Eating and drinking Follow instructions from your doctor about eating and drinking. These instructions may include:  A few days before the procedure - follow a low-fiber diet. ? Avoid nuts. ? Avoid  seeds. ? Avoid dried fruit. ? Avoid raw fruits. ? Avoid vegetables.  1-3 days before the procedure - follow a clear liquid diet. Avoid liquids that have red or purple dye. Drink only clear liquids, such as: ? Clear broth or bouillon. ? Black coffee or tea. ? Clear juice. ? Clear soft drinks or sports drinks. ? Gelatin dessert. ? Popsicles.  On the day of the procedure - do not eat or drink anything during the 2 hours before the procedure.  Bowel prep If you were prescribed an oral bowel prep:  Take it as told by your doctor. Starting the day before your procedure, you will need to drink a lot of liquid. The liquid will cause you to poop (have bowel movements) until your poop is almost clear or light green.  If your skin or butt gets irritated from diarrhea, you may: ? Wipe the area with wipes that have medicine in them, such as adult wet wipes with aloe and vitamin E. ? Put something on your skin that soothes the area, such as petroleum jelly.  If you throw up (vomit) while drinking the bowel prep, take a break for up to 60 minutes. Then begin the bowel prep again. If you keep throwing up and you cannot take the bowel prep without throwing up, call your doctor.  General  instructions  Ask your doctor about changing or stopping your normal medicines. This is important if you take diabetes medicines or blood thinners.  Plan to have someone take you home from the hospital or clinic. What happens during the procedure?  An IV tube may be put into one of your veins.  You will be given medicine to help you relax (sedative).  To reduce your risk of infection: ? Your doctors will wash their hands. ? Your anal area will be washed with soap.  You will be asked to lie on your side with your knees bent.  Your doctor will get a long, thin, flexible tube ready. The tube will have a camera and a light on the end.  The tube will be put into your anus.  The tube will be gently put into  your large intestine.  Air will be delivered into your large intestine to keep it open. You may feel some pressure or cramping.  The camera will be used to take photos.  A small tissue sample may be removed from your body to be looked at under a microscope (biopsy). If any possible problems are found, the tissue will be sent to a lab for testing.  If small growths are found, your doctor may remove them and have them checked for cancer.  The tube that was put into your anus will be slowly removed. The procedure may vary among doctors and hospitals. What happens after the procedure?  Your doctor will check on you often until the medicines you were given have worn off.  Do not drive for 24 hours after the procedure.  You may have a small amount of blood in your poop.  You may pass gas.  You may have mild cramps or bloating in your belly (abdomen).  It is up to you to get the results of your procedure. Ask your doctor, or the department performing the procedure, when your results will be ready. This information is not intended to replace advice given to you by your health care provider. Make sure you discuss any questions you have with your health care provider. Document Released: 12/22/2010 Document Revised: 09/19/2016 Document Reviewed: 01/31/2016 Elsevier Interactive Patient Education  2017 [employer] Inc[employer]  Colonoscopy, Adult, Care After This sheet gives you information about how to care for yourself after your procedure. Your health care provider may also give you more specific instructions. If you have problems or questions, contact your health care provider. What can I expect after the procedure? After the procedure, it is common to have:  A small amount of blood in your stool for 24 hours after the procedure.  Some gas.  Mild abdominal cramping or bloating.  Follow these instructions at home: General instructions   For the first 24 hours after the procedure: ? Do not  drive or use machinery. ? Do not sign important documents. ? Do not drink alcohol. ? Do your regular daily activities at a slower pace than normal. ? Eat soft, easy-to-digest foods. ? Rest often.  Take over-the-counter or prescription medicines only as told by your health care provider.  It is up to you to get the results of your procedure. Ask your health care provider, or the department performing the procedure, when your results will be ready. Relieving cramping and bloating  Try walking around when you have cramps or feel bloated.  Apply heat to your abdomen as told by your health care provider. Use a heat source that your health care provider  recommends, such as a moist heat pack or a heating pad. ? Place a towel between your skin and the heat source. ? Leave the heat on for 20-30 minutes. ? Remove the heat if your skin turns bright red. This is especially important if you are unable to feel pain, heat, or cold. You may have a greater risk of getting burned. Eating and drinking  Drink enough fluid to keep your urine clear or pale yellow.  Resume your normal diet as instructed by your health care provider. Avoid heavy or fried foods that are hard to digest.  Avoid drinking alcohol for as long as instructed by your health care provider. Contact a health care provider if:  You have blood in your stool 2-3 days after the procedure. Get help right away if:  You have more than a small spotting of blood in your stool.  You pass large blood clots in your stool.  Your abdomen is swollen.  You have nausea or vomiting.  You have a fever.  You have increasing abdominal pain that is not relieved with medicine. This information is not intended to replace advice given to you by your health care provider. Make sure you discuss any questions you have with your health care provider. Document Released: 07/03/2004 Document Revised: 08/13/2016 Document Reviewed: 01/31/2016 Elsevier  Interactive Patient Education  2018 Else[employer] Inc.  Monitored Anesthesia Care Anesthesia is a term that refers to techniques, procedures, and medicines that help a person stay safe and comfortable during a medical procedure. Monitored anesthesia care, or sedation, is one type of anesthesia. Your anesthesia specialist may recommend sedation if you will be having a procedure that does not require you to be unconscious, such as:  Cataract surgery.  A dental procedure.  A biopsy.  A colonoscopy.  During the procedure, you may receive a medicine to help you relax (sedative). There are three levels of sedation:  Mild sedation. At this level, you may feel awake and relaxed. You will be able to follow directions.  Moderate sedation. At this level, you will be sleepy. You may not remember the procedure.  Deep sedation. At this level, you will be asleep. You will not remember the procedure.  The more medicine you are given, the deeper your level of sedation will be. Depending on how you respond to the procedure, the anesthesia specialist may change your level of sedation or the type of anesthesia to fit your needs. An anesthesia specialist will monitor you closely during the procedure. Let your health care provider know about:  Any allergies you have.  All medicines you are taking, including vitamins, herbs, eye drops, creams, and over-the-counter medicines.  Any use of steroids (by mouth or as a cream).  Any problems you or family members have had with sedatives and anesthetic medicines.  Any blood disorders you have.  Any surgeries you have had.  Any medical conditions you have, such as sleep apnea.  Whether you are pregnant or may be pregnant.  Any use of cigarettes, alcohol, or street drugs. What are the risks? Generally, this is a safe procedure. However, problems may occur, including:  Getting too much medicine (oversedation).  Nausea.  Allergic reaction to  medicines.  Trouble breathing. If this happens, a breathing tube may be used to help with breathing. It will be removed when you are awake and breathing on your own.  Heart trouble.  Lung trouble.  Before the procedure Staying hydrated Follow instructions from your health care provider about hydration,  which may include:  Up to 2 hours before the procedure - you may continue to drink clear liquids, such as water, clear fruit juice, black coffee, and plain tea.  Eating and drinking restrictions Follow instructions from your health care provider about eating and drinking, which may include:  8 hours before the procedure - stop eating heavy meals or foods such as meat, fried foods, or fatty foods.  6 hours before the procedure - stop eating light meals or foods, such as toast or cereal.  6 hours before the procedure - stop drinking milk or drinks that contain milk.  2 hours before the procedure - stop drinking clear liquids.  Medicines Ask your health care provider about:  Changing or stopping your regular medicines. This is especially important if you are taking diabetes medicines or blood thinners.  Taking medicines such as aspirin and ibuprofen. These medicines can thin your blood. Do not take these medicines before your procedure if your health care provider instructs you not to.  Tests and exams  You will have a physical exam.  You may have blood tests done to show: ? How well your kidneys and liver are working. ? How well your blood can clot.  General instructions  Plan to have someone take you home from the hospital or clinic.  If you will be going home right after the procedure, plan to have someone with you for 24 hours.  What happens during the procedure?  Your blood pressure, heart rate, breathing, level of pain and overall condition will be monitored.  An IV tube will be inserted into one of your veins.  Your anesthesia specialist will give you medicines  as needed to keep you comfortable during the procedure. This may mean changing the level of sedation.  The procedure will be performed. After the procedure  Your blood pressure, heart rate, breathing rate, and blood oxygen level will be monitored until the medicines you were given have worn off.  Do not drive for 24 hours if you received a sedative.  You may: ? Feel sleepy, clumsy, or nauseous. ? Feel forgetful about what happened after the procedure. ? Have a sore throat if you had a breathing tube during the procedure. ? Vomit. This information is not intended to replace advice given to you by your health care provider. Make sure you discuss any questions you have with your health care provider. Document Released: 08/15/2005 Document Revised: 04/27/2016 Document Reviewed: 03/11/2016 Elsevier Interactive Patient Education  2018 Elsevier Inc. Monitored Anesthesia Care, Care After These instructions provide you with information about caring for yourself after your procedure. Your health care provider may also give you more specific instructions. Your treatment has been planned according to current medical practices, but problems sometimes occur. Call your health care provider if you have any problems or questions after your procedure. What can I expect after the procedure? After your procedure, it is common to:  Feel sleepy for several hours.  Feel clumsy and have poor balance for several hours.  Feel forgetful about what happened after the procedure.  Have poor judgment for several hours.  Feel nauseous or vomit.  Have a sore throat if you had a breathing tube during the procedure.  Follow these instructions at home: For at least 24 hours after the procedure:   Do not: ? Participate in activities in which you could fall or become injured. ? Drive. ? Use heavy machinery. ? Drink alcohol. ? Take sleeping pills or medicines that cause  drowsiness. ? Make important decisions  or sign legal documents. ? Take care of children on your own.  Rest. Eating and drinking  Follow the diet that is recommended by your health care provider.  If you vomit, drink water, juice, or soup when you can drink without vomiting.  Make sure you have little or no nausea before eating solid foods. General instructions  Have a responsible adult stay with you until you are awake and alert.  Take over-the-counter and prescription medicines only as told by your health care provider.  If you smoke, do not smoke without supervision.  Keep all follow-up visits as told by your health care provider. This is important. Contact a health care provider if:  You keep feeling nauseous or you keep vomiting.  You feel light-headed.  You develop a rash.  You have a fever. Get help right away if:  You have trouble breathing. This information is not intended to replace advice given to you by your health care provider. Make sure you discuss any questions you have with your health care provider. Document Released: 03/11/2016 Document Revised: 07/11/2016 Document Reviewed: 03/11/2016 Elsevier Interactive Patient Education  [employer].

## 2018-10-15 ENCOUNTER — Encounter (HOSPITAL_COMMUNITY): Payer: Self-pay

## 2018-10-15 ENCOUNTER — Telehealth: Payer: Self-pay

## 2018-10-15 ENCOUNTER — Encounter (HOSPITAL_COMMUNITY)
Admission: RE | Admit: 2018-10-15 | Discharge: 2018-10-15 | Disposition: A | Discharge status: Home / Self Care | Payer: Medicare Other | Source: Ambulatory Visit | Attending: Gastroenterology | Admitting: Gastroenterology

## 2018-10-15 NOTE — Telephone Encounter (Signed)
Endo scheduler called office, pt was no show for pre-op appt. When nurse called her she said she had called the hospital and doctors office to cancel. Called pt to confirm she wanted to cancel procedure. She wants to cancel for now d/t her son is in the hospital. She will let us know when she wants to proceed.

## 2018-10-16 NOTE — Telephone Encounter (Signed)
Noted  

## 2018-10-21 ENCOUNTER — Ambulatory Visit (HOSPITAL_COMMUNITY): Admission: RE | Admit: 2018-10-21 | Payer: Medicare Other | Source: Ambulatory Visit | Admitting: Gastroenterology

## 2018-10-21 ENCOUNTER — Encounter (HOSPITAL_COMMUNITY): Admission: RE | Payer: Self-pay | Source: Ambulatory Visit

## 2018-10-21 SURGERY — COLONOSCOPY WITH PROPOFOL
Anesthesia: Monitor Anesthesia Care

## 2018-11-24 ENCOUNTER — Encounter: Payer: Self-pay | Admitting: Orthopedic Surgery

## 2018-12-08 ENCOUNTER — Ambulatory Visit: Payer: Medicare Other | Admitting: Orthopedic Surgery

## 2018-12-08 ENCOUNTER — Ambulatory Visit (INDEPENDENT_AMBULATORY_CARE_PROVIDER_SITE_OTHER): Payer: Medicare Other

## 2018-12-08 ENCOUNTER — Encounter: Payer: Self-pay | Admitting: Orthopedic Surgery

## 2018-12-08 VITALS — BP 103/72 | HR 75 | Ht 67.0 in | Wt 156.0 lb

## 2018-12-08 DIAGNOSIS — Z9889 Other specified postprocedural states: Secondary | ICD-10-CM

## 2018-12-08 DIAGNOSIS — M545 Low back pain, unspecified: Secondary | ICD-10-CM

## 2018-12-08 DIAGNOSIS — M79605 Pain in left leg: Secondary | ICD-10-CM

## 2018-12-08 DIAGNOSIS — M1710 Unilateral primary osteoarthritis, unspecified knee: Secondary | ICD-10-CM

## 2018-12-08 DIAGNOSIS — M79604 Pain in right leg: Secondary | ICD-10-CM

## 2018-12-08 NOTE — Progress Notes (Signed)
NEW PATIENT OFFICE VISIT  Chief Complaint  Patient presents with  . Foot Injury    Right foot injury, DOI 11-24-18.    [age] female status [place] Manson PasseyBrown presents with right leg pain which started about 3 or 4 months ago, no history of trauma.  The pain does radiate from her right hip to her right foot with severe pain including pain in her shin and giving way symptoms on the left leg.  She did try a cortisone injection in her knee when x-rays of the knee showed osteoarthritis and she is on gabapentin ibuprofen baclofen and hydrocodone recently for the pain.  She reports severe pain again she had giving way of the left leg w/  burning sensation in the right leg   Review of Systems  Musculoskeletal: Positive for back pain and joint pain.  Neurological: Positive for tingling, sensory change and weakness. Negative for tremors.  All other systems reviewed and are negative.    Past Medical History:  Diagnosis Date  . Arthritis   . COPD (chronic obstructive pulmonary disease) (HCC)   . Diabetes mellitus   . DVT (deep venous thrombosis) (HCC)    years ago  . GERD (gastroesophageal reflux disease)   . High cholesterol   . Hypertension   . Hypothyroidism   . Thyroid disease     Past Surgical History:  Procedure Laterality Date  . BACK SURGERY     lumbar  . ESOPHAGOGASTRODUODENOSCOPY  04/04/2012   Moderate gastritis/MODERATE Duodenitis in the duodenal bulb duodenum  . EUS  05/14/2012   Normal pancreas, gallbladder,biliary tree/ Incidental finding of simple-appearing right renal cyst.  . TONSILLECTOMY    . TUBAL LIGATION    . VEIN SURGERY     stripping years ago but recent laser surgery; bilateral legs    Family History  Problem Relation Age of Onset  . Pancreatic cancer Mother        diagnosed at age 8070s, died from heart attack  . Inflammatory bowel disease Brother        crohn's, his dgt deceased from colon cancer at age 74  . Colon cancer  Neg Hx    Social History   Tobacco Use  . Smoking status: Former Smoker    Packs/day: 0.50  . Smokeless tobacco: Never Used  Substance Use Topics  . Alcohol use: No  . Drug use: No    Allergies  Allergen Reactions  . Ciprocin-Fluocin-Procin [Fluocinolone]     Nightmares  . Lipitor [Atorvastatin] Other (See Comments)    Muscle aches, pain  . Red Yeast Rice [Cholestin] Itching and Rash    No outpatient medications have been marked as taking for the 12/08/18 encounter (Office Visit) with Vickki HearingHarrison, Dimples Probus E, MD.    BP 103/72   Pulse 75   Ht 5\' 7"  (1.702 m)   Wt 156 lb (70.8 kg)   BMI 24.43 kg/m   Physical Exam Vitals signs reviewed.  Constitutional:      General: She is not in acute distress.    Appearance: Normal appearance. She is not ill-appearing.  HENT:     Head: Normocephalic and atraumatic.     Nose: No congestion or rhinorrhea.     Mouth/Throat:     Mouth: Mucous membranes are dry.     Pharynx: Oropharynx is clear.  Eyes:     General: No scleral icterus.       Right eye: No discharge.  Left eye: No discharge.     Extraocular Movements: Extraocular movements intact.     Conjunctiva/sclera: Conjunctivae normal.     Pupils: Pupils are equal, round, and reactive to light.  Cardiovascular:     Rate and Rhythm: Normal rate.     Pulses: Normal pulses.  Pulmonary:     Effort: Pulmonary effort is normal. No respiratory distress.     Breath sounds: No stridor. No wheezing.  Abdominal:     General: Abdomen is flat. There is no distension.     Palpations: Abdomen is soft. There is no mass.  Musculoskeletal:     Right knee: She exhibits no effusion.     Left knee: She exhibits effusion.       Back:     Comments: Right and left upper extremity  No tenderness normal range of motion normal alignment no instability all joints reduced muscle tone and strength normal neurovascular exam intact skin normal  Lymphadenopathy:     Cervical: No cervical adenopathy.       Lower Body: No right inguinal adenopathy. No left inguinal adenopathy.  Skin:    General: Skin is warm and dry.     Capillary Refill: Capillary refill takes less than 2 seconds.  Neurological:     Mental Status: She is alert and oriented to person, place, and time.     Cranial Nerves: Cranial nerves are intact.     Sensory: Sensation is intact.     Motor: Motor function is intact.     Coordination: Coordination is intact.     Gait: Gait abnormal.     Deep Tendon Reflexes: Babinski sign absent on the right side. Babinski sign absent on the left side.     Reflex Scores:      Patellar reflexes are 0 on the right side and 2+ on the left side.      Achilles reflexes are 0 on the right side and 0 on the left side. Psychiatric:        Mood and Affect: Mood normal.        Behavior: Behavior normal.        Thought Content: Thought content normal.        Judgment: Judgment normal.     Right Knee Exam   Muscle Strength  The patient has normal right knee strength.  Tenderness  The patient is experiencing tenderness in the medial joint line.  Range of Motion  Extension: normal  Flexion: normal   Other  Effusion: no effusion present   Left Knee Exam   Muscle Strength  The patient has normal left knee strength.  Tenderness  The patient is experiencing no tenderness.   Range of Motion  Extension: normal  Flexion: normal   Other  Effusion: effusion present   Right Hip Exam  Right hip exam is normal.   Tenderness  The patient is experiencing no tenderness.   Range of Motion  The patient has normal right hip ROM.  Muscle Strength  The patient has normal right hip strength.  Tests  FABER: negative  Other  Erythema: absent Sensation: normal Pulse: present  Comments:  HIP STABILITY NORMAL    Left Hip Exam  Left hip exam is normal.  Tenderness  The patient is experiencing no tenderness.   Range of Motion  The patient has normal left hip  ROM.  Muscle Strength  The patient has normal left hip strength.   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  . Low back pain radiating to right leg Yes  . Primary localized osteoarthritis of knee-right   . Status post lumbar surgery x 3      PLAN: (Rx., injectx, surgery, frx, mri/ct) Recommend f/u with primary surgeon, patient will need further work-up including MRI and then potential nonoperative treatments could be physical therapy or epidural injection versus repeat surgical decompression plus or minus fusion  No orders of the defined types were placed in this encounter.   Fuller CanadaStanley Olyvia Gopal, MD  12/08/2018 2:02 PM

## 2018-12-08 NOTE — Addendum Note (Signed)
Addended byCaffie Damme on: 12/08/2018 02:40 PM   Modules accepted: Orders

## 2018-12-09 ENCOUNTER — Telehealth: Payer: Self-pay | Admitting: Radiology

## 2018-12-09 DIAGNOSIS — M545 Low back pain, unspecified: Secondary | ICD-10-CM

## 2018-12-09 DIAGNOSIS — M79604 Pain in right leg: Secondary | ICD-10-CM

## 2018-12-09 NOTE — Telephone Encounter (Signed)
Dr Irving Burton office will not see patient with out an updated MRI

## 2018-12-10 NOTE — Telephone Encounter (Signed)
Put in order, and patient requested MRI scan at [facility]. I have called central scheduling and left message for them to call me back to make the appointment.

## 2018-12-10 NOTE — Telephone Encounter (Signed)
Ok  Try to get one if denied primary care will have to do it

## 2019-08-21 ENCOUNTER — Emergency Department (HOSPITAL_COMMUNITY): Payer: Medicare Other

## 2019-08-21 ENCOUNTER — Other Ambulatory Visit: Payer: Self-pay

## 2019-08-21 ENCOUNTER — Emergency Department (HOSPITAL_COMMUNITY)
Admission: EM | Admit: 2019-08-21 | Discharge: 2019-08-21 | Disposition: A | Discharge status: Home / Self Care | Payer: Medicare Other | Attending: Emergency Medicine | Admitting: Emergency Medicine

## 2019-08-21 ENCOUNTER — Encounter (HOSPITAL_COMMUNITY): Payer: Self-pay | Admitting: Emergency Medicine

## 2019-08-21 DIAGNOSIS — Z794 Long term (current) use of insulin: Secondary | ICD-10-CM | POA: Insufficient documentation

## 2019-08-21 DIAGNOSIS — Z79899 Other long term (current) drug therapy: Secondary | ICD-10-CM | POA: Diagnosis not present

## 2019-08-21 DIAGNOSIS — Z86718 Personal history of other venous thrombosis and embolism: Secondary | ICD-10-CM | POA: Insufficient documentation

## 2019-08-21 DIAGNOSIS — E039 Hypothyroidism, unspecified: Secondary | ICD-10-CM | POA: Diagnosis not present

## 2019-08-21 DIAGNOSIS — I10 Essential (primary) hypertension: Secondary | ICD-10-CM | POA: Diagnosis not present

## 2019-08-21 DIAGNOSIS — E119 Type 2 diabetes mellitus without complications: Secondary | ICD-10-CM | POA: Diagnosis not present

## 2019-08-21 DIAGNOSIS — E785 Hyperlipidemia, unspecified: Secondary | ICD-10-CM | POA: Diagnosis not present

## 2019-08-21 DIAGNOSIS — Z96651 Presence of right artificial knee joint: Secondary | ICD-10-CM | POA: Diagnosis not present

## 2019-08-21 DIAGNOSIS — G8918 Other acute postprocedural pain: Secondary | ICD-10-CM | POA: Diagnosis not present

## 2019-08-21 DIAGNOSIS — M79604 Pain in right leg: Secondary | ICD-10-CM | POA: Diagnosis present

## 2019-08-21 DIAGNOSIS — Z881 Allergy status to other antibiotic agents status: Secondary | ICD-10-CM | POA: Diagnosis not present

## 2019-08-21 DIAGNOSIS — Z87891 Personal history of nicotine dependence: Secondary | ICD-10-CM | POA: Insufficient documentation

## 2019-08-21 DIAGNOSIS — Z888 Allergy status to other drugs, medicaments and biological substances status: Secondary | ICD-10-CM | POA: Diagnosis not present

## 2019-08-21 LAB — BASIC METABOLIC PANEL
Anion gap: 10 (ref 5–15)
BUN: 22 mg/dL (ref 8–23)
CO2: 25 mmol/L (ref 22–32)
Calcium: 9.5 mg/dL (ref 8.9–10.3)
Chloride: 99 mmol/L (ref 98–111)
Creatinine, Ser: 0.88 mg/dL (ref 0.44–1.00)
GFR calc Af Amer: >60 mL/min (ref >60)
GFR calc non Af Amer: >60 mL/min (ref >60)
Glucose, Bld: 263 mg/dL — ABNORMAL HIGH (ref 70–99)
Potassium: 3.6 mmol/L (ref 3.5–5.1)
Sodium: 134 mmol/L — ABNORMAL LOW (ref 135–145)

## 2019-08-21 LAB — CBC WITH DIFFERENTIAL/PLATELET
Abs Immature Granulocytes: 0.04 10*3/uL (ref 0.00–0.07)
Basophils Absolute: 0 10*3/uL (ref 0.0–0.1)
Basophils Relative: 0 %
Eosinophils Absolute: 0.1 10*3/uL (ref 0.0–0.5)
Eosinophils Relative: 1 %
HCT: 32.6 % — ABNORMAL LOW (ref 36.0–46.0)
Hemoglobin: 10.4 g/dL — ABNORMAL LOW (ref 12.0–15.0)
Immature Granulocytes: 0 %
Lymphocytes Relative: 18 %
Lymphs Abs: 1.7 10*3/uL (ref 0.7–4.0)
MCH: 31.2 pg (ref 26.0–34.0)
MCHC: 31.9 g/dL (ref 30.0–36.0)
MCV: 97.9 fL (ref 80.0–100.0)
Monocytes Absolute: 1.2 10*3/uL — ABNORMAL HIGH (ref 0.1–1.0)
Monocytes Relative: 13 %
Neutro Abs: 6.3 10*3/uL (ref 1.7–7.7)
Neutrophils Relative %: 68 %
Platelets: 284 10*3/uL (ref 150–400)
RBC: 3.33 MIL/uL — ABNORMAL LOW (ref 3.87–5.11)
RDW: 13.4 % (ref 11.5–15.5)
WBC: 9.3 10*3/uL (ref 4.0–10.5)
nRBC: 0 % (ref 0.0–0.2)

## 2019-08-21 LAB — CBG MONITORING, ED: Glucose-Capillary: 246 mg/dL — ABNORMAL HIGH (ref 70–99)

## 2019-08-21 NOTE — ED Provider Notes (Signed)
[facility] EMERGENCY DEPARTMENT Provider Note   CSN: [id number] Arrival date & time: 08/21/19  1418     History   Chief Complaint Chief Complaint  Patient presents with   Leg Pain    HPI Gabrielle Wood is a 73 y.o. female with history of hypertension, hypothyroidism, GERD, DVT, diabetes, COPD who presents following right TKA on 08/14/2019.  Patient has had pain in her calf and ankle since surgery, however her physical therapist sent her here to rule out DVT with ultrasound today.  Patient son states that the pain has been present since surgery.  She denies any numbness or tingling, fevers, chest pain, shortness of breath, abdominal pain, nausea, vomiting.     HPI  Past Medical History:  Diagnosis Date   Arthritis    COPD (chronic obstructive pulmonary disease) ([place])    Diabetes mellitus    DVT (deep venous thrombosis) ([place])    years ago   GERD (gastroesophageal reflux disease)    High cholesterol    Hypertension    Hypothyroidism    Thyroid disease     Patient Active Problem List   Diagnosis Date Noted   History of colonic polyps 08/27/2018   Taking multiple medications for chronic disease 08/27/2018   Uncontrolled type 2 diabetes mellitus with circulatory disorder, with long-term current use of insulin ([place]) 09/14/2015   Tobacco abuse counseling 09/14/2015   Gastritis and duodenitis 04/05/2012   Pancreatitis, acute 04/03/2012   Mixed hyperlipidemia 04/03/2012   Benign hypertension 04/03/2012   Hypothyroidism 04/03/2012   GERD (gastroesophageal reflux disease) 04/03/2012    Past Surgical History:  Procedure Laterality Date   BACK SURGERY     lumbar x3    ESOPHAGOGASTRODUODENOSCOPY  04/04/2012   Moderate gastritis/MODERATE Duodenitis in the duodenal bulb duodenum   EUS  05/14/2012   Normal pancreas, gallbladder,biliary tree/ Incidental finding of simple-appearing right renal cyst.   TONSILLECTOMY     TUBAL LIGATION     VEIN SURGERY      stripping years ago but recent laser surgery; bilateral legs     OB History   No obstetric history on file.      Home Medications    Prior to Admission medications   Medication Sig Start Date End Date Taking? Authorizing Provider  ALPRAZolam Duanne Moron) 0.5 MG tablet Take 0.5 mg by mouth 2 (two) times daily.    [provider]  atenolol (TENORMIN) 25 MG tablet Take 25 mg by mouth daily.  03/21/12   [provider]  baclofen (LIORESAL) 10 MG tablet Take 1 tablet by mouth 2 (two) times daily as needed. 08/22/18   [provider]  Calcium Carbonate (CALCIUM 600 PO) Take by mouth daily.     [provider]  Cholecalciferol (VITAMIN D3) 5000 units CAPS Take by mouth daily.    [provider]  gabapentin (NEURONTIN) 100 MG capsule Take 100 mg by mouth 2 (two) times daily. Takes one capsule every morning and 2 every evening    [provider]  HUMALOG KWIKPEN 200 UNIT/ML SOPN Inject 14 Units into the skin 3 (three) times daily. 08/07/18   [provider]  HYDROcodone-acetaminophen (LORTAB) 10-500 MG per tablet Take 1 tablet by mouth every 6 (six) hours as needed for pain. For pain 04/05/12   Delfina Redwood, MD  levothyroxine (SYNTHROID, LEVOTHROID) 137 MCG tablet Take 137 mcg by mouth daily before breakfast.    [provider]  loratadine (CLARITIN) 10 MG tablet Take 10 mg by  mouth daily. 08/05/18   [provider]  losartan (COZAAR) 25 MG tablet Take 25 mg by mouth daily.    [provider]  losartan (COZAAR) 50 MG tablet Take 50 mg by mouth daily.    [provider]  metFORMIN (GLUCOPHAGE) 500 MG tablet TAKE 1 TABLET BY MOUTH TWICE DAILY Patient taking differently: Take 1,000 mg by mouth daily.  07/11/17   Cassandria Anger, MD  Na Sulfate-K Sulfate-Mg Sulf 17.5-3.13-1.6 GM/177ML SOLN Take 1 kit by mouth as directed. 08/27/18   Fields, Marga Melnick, MD  pantoprazole (PROTONIX) 40 MG tablet Take 1 tablet  (40 mg total) by mouth 2 (two) times daily. Patient taking differently: Take 40 mg by mouth daily.  04/05/12 08/27/18  Delfina Redwood, MD  TRESIBA FLEXTOUCH 200 UNIT/ML SOPN Inject 28 Units into the skin at bedtime. 07/25/18   [provider]    Family History Family History  Problem Relation Age of Onset   Pancreatic cancer Mother        diagnosed at age 81s, died from heart attack   Inflammatory bowel disease Brother        crohn's, his dgt deceased from colon cancer at age 13   Colon cancer Neg Hx     Social History Social History   Tobacco Use   Smoking status: Former Smoker    Packs/day: 0.50   Smokeless tobacco: Never Used  Substance Use Topics   Alcohol use: No   Drug use: No     Allergies   Ciprocin-fluocin-procin [fluocinolone], Lipitor [atorvastatin], Lisinopril, and Red yeast rice [cholestin]   Review of Systems Review of Systems  Constitutional: Negative for chills and fever.  HENT: Negative for facial swelling and sore throat.   Respiratory: Negative for shortness of breath.   Cardiovascular: Negative for chest pain.  Gastrointestinal: Negative for abdominal pain, nausea and vomiting.  Genitourinary: Negative for dysuria.  Musculoskeletal: Positive for arthralgias and myalgias. Negative for back pain.  Skin: Negative for rash and wound.  Neurological: Negative for headaches.  Psychiatric/Behavioral: The patient is not nervous/anxious.      Physical Exam Updated Vital Signs BP (!) 142/68 (BP Location: Left Arm)    Pulse 70    Temp 98.4 F (36.9 C) (Oral)    Resp 16    Ht 5' 6"  (1.676 m)    Wt 68.5 kg    SpO2 95%    BMI 24.37 kg/m   Physical Exam Vitals signs and nursing note reviewed.  Constitutional:      General: She is not in acute distress.    Appearance: She is well-developed. She is not diaphoretic.  HENT:     Head: Normocephalic and atraumatic.     Mouth/Throat:     Pharynx: No oropharyngeal exudate.  Eyes:      General: No scleral icterus.       Right eye: No discharge.        Left eye: No discharge.     Conjunctiva/sclera: Conjunctivae normal.     Pupils: Pupils are equal, round, and reactive to light.  Neck:     Musculoskeletal: Normal range of motion and neck supple.     Thyroid: No thyromegaly.  Cardiovascular:     Rate and Rhythm: Normal rate and regular rhythm.     Heart sounds: Normal heart sounds. No murmur. No friction rub. No gallop.   Pulmonary:     Effort: Pulmonary effort is normal. No respiratory distress.     Breath sounds:  Normal breath sounds. No stridor. No wheezing or rales.  Abdominal:     General: Bowel sounds are normal. There is no distension.     Palpations: Abdomen is soft.     Tenderness: There is no abdominal tenderness. There is no guarding or rebound.  Musculoskeletal:     Comments: Surgical wound with staples in place to right knee, no erythema or drainage; mild warmth and effusion; tenderness to the right calf to the ankle; no erythema, no significant edema or swelling noted to the lower leg  Lymphadenopathy:     Cervical: No cervical adenopathy.  Skin:    General: Skin is warm and dry.     Coloration: Skin is not pale.     Findings: No rash.  Neurological:     Mental Status: She is alert.     Coordination: Coordination normal.      ED Treatments / Results  Labs (all labs ordered are listed, but only abnormal results are displayed) Labs Reviewed  BASIC METABOLIC PANEL - Abnormal; Notable for the following components:      Result Value   Sodium 134 (*)    Glucose, Bld 263 (*)    All other components within normal limits  CBC WITH DIFFERENTIAL/PLATELET - Abnormal; Notable for the following components:   RBC 3.33 (*)    Hemoglobin 10.4 (*)    HCT 32.6 (*)    Monocytes Absolute 1.2 (*)    All other components within normal limits  CBG MONITORING, ED - Abnormal; Notable for the following components:   Glucose-Capillary 246 (*)    All other  components within normal limits    EKG None  Radiology US Venous Img Lower Unilateral Right  Result Date: 08/21/2019 CLINICAL DATA:  73 year old female with a history of pain and swelling EXAM: RIGHT LOWER EXTREMITY VENOUS DOPPLER ULTRASOUND TECHNIQUE: Gray-scale sonography with graded compression, as well as color Doppler and duplex ultrasound were performed to evaluate the lower extremity deep venous systems from the level of the common femoral vein and including the common femoral, femoral, profunda femoral, popliteal and calf veins including the posterior tibial, peroneal and gastrocnemius veins when visible. The superficial great saphenous vein was also interrogated. Spectral Doppler was utilized to evaluate flow at rest and with distal augmentation maneuvers in the common femoral, femoral and popliteal veins. COMPARISON:  None. FINDINGS: Contralateral Common Femoral Vein: Respiratory phasicity is normal and symmetric with the symptomatic side. No evidence of thrombus. Normal compressibility. Common Femoral Vein: No evidence of thrombus. Normal compressibility, respiratory phasicity and response to augmentation. Saphenofemoral Junction: No evidence of thrombus. Normal compressibility and flow on color Doppler imaging. Profunda Femoral Vein: No evidence of thrombus. Normal compressibility and flow on color Doppler imaging. Femoral Vein: No evidence of thrombus. Normal compressibility, respiratory phasicity and response to augmentation. Popliteal Vein: No evidence of thrombus. Normal compressibility, respiratory phasicity and response to augmentation. Calf Veins: Partial visualization, with no thrombus identified in the calf veins Superficial Great Saphenous Vein: No evidence of thrombus. Normal compressibility and flow on color Doppler imaging. Other Findings:  None. IMPRESSION: Sonographic survey of the right lower extremity negative for DVT Electronically Signed   By: Corrie Mckusick D.O.   On:  08/21/2019 15:37    Procedures Procedures (including critical care time)  Medications Ordered in ED Medications - No data to display   Initial Impression / Assessment and Plan / ED Course  I have reviewed the triage vital signs and the nursing notes.  Pertinent labs &  imaging results that were available during my care of the patient were reviewed by me and considered in my medical decision making (see chart for details).        Patient with postoperative pain from TKA on 08/14/2019.  DVT ultrasound is negative.  Patient's labs are stable for the patient.  Suspect postoperative pain considering pain has been present since surgery.  Will have patient follow-up closely with orthopedic surgery as she has follow-up on 08/26/2019.  Continue pain medication and icing at home.  Advised ice the calf as well.  Return precautions discussed including increasing pain, swelling, redness, warmth, fever.  Patient and her son understand and agree with plan.  Patient vital stable throughout ED course and discharged in satisfactory condition. I discussed patient case with Dr. Thurnell Garbe who guided the patient's management and agrees with plan.   Final Clinical Impressions(s) / ED Diagnoses   Final diagnoses:  Post-op pain    ED Discharge Orders    None       Frederica Kuster, PA-C 08/21/19 [place], [place], DO 08/23/19 1623

## 2019-08-21 NOTE — ED Notes (Signed)
Patient's son came in to registration requesting to check patients glucose. Patient had blood drawn and glucose is 263. Spoke with patient and gave her glucose results and updated patient on wait time. Patient frustrated and sitting in the waiting room at this time. Attempted to call son with update, no answer and unable to leave message.

## 2019-08-21 NOTE — Discharge Instructions (Addendum)
Please follow-up with your surgeon as soon as possible for further evaluation and symptoms.  If you develop any increasing pain, swelling, redness, drainage from your wound, or fevers, please return to the emergency department immediately.

## 2019-08-21 NOTE — ED Triage Notes (Signed)
PT c/o right lower leg pain. PT has home health and the physical therapist told her to come to ED to get an ultrasound of her leg. PT had total knee replacement surgery on 08/14/2019. PT also states her blood sugar was reading high today and reports 401 at home this morning.

## 2019-12-07 DIAGNOSIS — Z96651 Presence of right artificial knee joint: Secondary | ICD-10-CM | POA: Diagnosis not present

## 2019-12-07 DIAGNOSIS — Z471 Aftercare following joint replacement surgery: Secondary | ICD-10-CM | POA: Diagnosis not present

## 2019-12-07 DIAGNOSIS — R2689 Other abnormalities of gait and mobility: Secondary | ICD-10-CM | POA: Diagnosis not present

## 2019-12-07 DIAGNOSIS — M6281 Muscle weakness (generalized): Secondary | ICD-10-CM | POA: Diagnosis not present

## 2019-12-07 DIAGNOSIS — M25561 Pain in right knee: Secondary | ICD-10-CM | POA: Diagnosis not present

## 2019-12-09 DIAGNOSIS — R2689 Other abnormalities of gait and mobility: Secondary | ICD-10-CM | POA: Diagnosis not present

## 2019-12-09 DIAGNOSIS — Z471 Aftercare following joint replacement surgery: Secondary | ICD-10-CM | POA: Diagnosis not present

## 2019-12-09 DIAGNOSIS — M6281 Muscle weakness (generalized): Secondary | ICD-10-CM | POA: Diagnosis not present

## 2019-12-09 DIAGNOSIS — Z96651 Presence of right artificial knee joint: Secondary | ICD-10-CM | POA: Diagnosis not present

## 2019-12-09 DIAGNOSIS — M25561 Pain in right knee: Secondary | ICD-10-CM | POA: Diagnosis not present

## 2019-12-10 DIAGNOSIS — M6281 Muscle weakness (generalized): Secondary | ICD-10-CM | POA: Diagnosis not present

## 2019-12-10 DIAGNOSIS — R2689 Other abnormalities of gait and mobility: Secondary | ICD-10-CM | POA: Diagnosis not present

## 2019-12-10 DIAGNOSIS — Z471 Aftercare following joint replacement surgery: Secondary | ICD-10-CM | POA: Diagnosis not present

## 2019-12-10 DIAGNOSIS — Z96651 Presence of right artificial knee joint: Secondary | ICD-10-CM | POA: Diagnosis not present

## 2019-12-10 DIAGNOSIS — M25561 Pain in right knee: Secondary | ICD-10-CM | POA: Diagnosis not present

## 2019-12-15 DIAGNOSIS — M25561 Pain in right knee: Secondary | ICD-10-CM | POA: Diagnosis not present

## 2019-12-15 DIAGNOSIS — R2689 Other abnormalities of gait and mobility: Secondary | ICD-10-CM | POA: Diagnosis not present

## 2019-12-15 DIAGNOSIS — Z96651 Presence of right artificial knee joint: Secondary | ICD-10-CM | POA: Diagnosis not present

## 2019-12-15 DIAGNOSIS — Z471 Aftercare following joint replacement surgery: Secondary | ICD-10-CM | POA: Diagnosis not present

## 2019-12-15 DIAGNOSIS — M6281 Muscle weakness (generalized): Secondary | ICD-10-CM | POA: Diagnosis not present

## 2019-12-17 DIAGNOSIS — Z471 Aftercare following joint replacement surgery: Secondary | ICD-10-CM | POA: Diagnosis not present

## 2019-12-17 DIAGNOSIS — M6281 Muscle weakness (generalized): Secondary | ICD-10-CM | POA: Diagnosis not present

## 2019-12-17 DIAGNOSIS — R2689 Other abnormalities of gait and mobility: Secondary | ICD-10-CM | POA: Diagnosis not present

## 2019-12-17 DIAGNOSIS — Z96651 Presence of right artificial knee joint: Secondary | ICD-10-CM | POA: Diagnosis not present

## 2019-12-17 DIAGNOSIS — M25561 Pain in right knee: Secondary | ICD-10-CM | POA: Diagnosis not present

## 2019-12-18 DIAGNOSIS — M6281 Muscle weakness (generalized): Secondary | ICD-10-CM | POA: Diagnosis not present

## 2019-12-18 DIAGNOSIS — R2689 Other abnormalities of gait and mobility: Secondary | ICD-10-CM | POA: Diagnosis not present

## 2019-12-18 DIAGNOSIS — Z96651 Presence of right artificial knee joint: Secondary | ICD-10-CM | POA: Diagnosis not present

## 2019-12-18 DIAGNOSIS — Z471 Aftercare following joint replacement surgery: Secondary | ICD-10-CM | POA: Diagnosis not present

## 2019-12-18 DIAGNOSIS — M25561 Pain in right knee: Secondary | ICD-10-CM | POA: Diagnosis not present

## 2019-12-21 DIAGNOSIS — Z6824 Body mass index (BMI) 24.0-24.9, adult: Secondary | ICD-10-CM | POA: Diagnosis not present

## 2019-12-21 DIAGNOSIS — I700 Atherosclerosis of aorta: Secondary | ICD-10-CM | POA: Diagnosis not present

## 2019-12-21 DIAGNOSIS — R197 Diarrhea, unspecified: Secondary | ICD-10-CM | POA: Diagnosis not present

## 2019-12-21 DIAGNOSIS — R110 Nausea: Secondary | ICD-10-CM | POA: Diagnosis not present

## 2019-12-21 DIAGNOSIS — K529 Noninfective gastroenteritis and colitis, unspecified: Secondary | ICD-10-CM | POA: Diagnosis not present

## 2019-12-21 DIAGNOSIS — Z299 Encounter for prophylactic measures, unspecified: Secondary | ICD-10-CM | POA: Diagnosis not present

## 2019-12-29 DIAGNOSIS — I10 Essential (primary) hypertension: Secondary | ICD-10-CM | POA: Diagnosis not present

## 2019-12-29 DIAGNOSIS — J449 Chronic obstructive pulmonary disease, unspecified: Secondary | ICD-10-CM | POA: Diagnosis not present

## 2019-12-29 DIAGNOSIS — E119 Type 2 diabetes mellitus without complications: Secondary | ICD-10-CM | POA: Diagnosis not present

## 2019-12-30 DIAGNOSIS — E1165 Type 2 diabetes mellitus with hyperglycemia: Secondary | ICD-10-CM | POA: Diagnosis not present

## 2019-12-30 DIAGNOSIS — E785 Hyperlipidemia, unspecified: Secondary | ICD-10-CM | POA: Diagnosis not present

## 2019-12-30 DIAGNOSIS — Z299 Encounter for prophylactic measures, unspecified: Secondary | ICD-10-CM | POA: Diagnosis not present

## 2019-12-30 DIAGNOSIS — R197 Diarrhea, unspecified: Secondary | ICD-10-CM | POA: Diagnosis not present

## 2019-12-30 DIAGNOSIS — Z6824 Body mass index (BMI) 24.0-24.9, adult: Secondary | ICD-10-CM | POA: Diagnosis not present

## 2019-12-30 DIAGNOSIS — R1030 Lower abdominal pain, unspecified: Secondary | ICD-10-CM | POA: Diagnosis not present

## 2020-01-01 DIAGNOSIS — E1169 Type 2 diabetes mellitus with other specified complication: Secondary | ICD-10-CM | POA: Diagnosis not present

## 2020-01-04 DIAGNOSIS — R197 Diarrhea, unspecified: Secondary | ICD-10-CM | POA: Diagnosis not present

## 2020-01-07 DIAGNOSIS — J449 Chronic obstructive pulmonary disease, unspecified: Secondary | ICD-10-CM | POA: Diagnosis not present

## 2020-01-07 DIAGNOSIS — I10 Essential (primary) hypertension: Secondary | ICD-10-CM | POA: Diagnosis not present

## 2020-01-15 DIAGNOSIS — I10 Essential (primary) hypertension: Secondary | ICD-10-CM | POA: Diagnosis not present

## 2020-01-15 DIAGNOSIS — J449 Chronic obstructive pulmonary disease, unspecified: Secondary | ICD-10-CM | POA: Diagnosis not present

## 2020-01-15 DIAGNOSIS — Z299 Encounter for prophylactic measures, unspecified: Secondary | ICD-10-CM | POA: Diagnosis not present

## 2020-01-15 DIAGNOSIS — E1165 Type 2 diabetes mellitus with hyperglycemia: Secondary | ICD-10-CM | POA: Diagnosis not present

## 2020-01-20 DIAGNOSIS — M545 Low back pain: Secondary | ICD-10-CM | POA: Diagnosis not present

## 2020-01-26 DIAGNOSIS — E1165 Type 2 diabetes mellitus with hyperglycemia: Secondary | ICD-10-CM | POA: Diagnosis not present

## 2020-01-26 DIAGNOSIS — J449 Chronic obstructive pulmonary disease, unspecified: Secondary | ICD-10-CM | POA: Diagnosis not present

## 2020-01-26 DIAGNOSIS — I10 Essential (primary) hypertension: Secondary | ICD-10-CM | POA: Diagnosis not present

## 2020-01-26 DIAGNOSIS — Z299 Encounter for prophylactic measures, unspecified: Secondary | ICD-10-CM | POA: Diagnosis not present

## 2020-01-26 DIAGNOSIS — F17210 Nicotine dependence, cigarettes, uncomplicated: Secondary | ICD-10-CM | POA: Diagnosis not present

## 2020-01-26 DIAGNOSIS — M549 Dorsalgia, unspecified: Secondary | ICD-10-CM | POA: Diagnosis not present

## 2020-01-28 DIAGNOSIS — E119 Type 2 diabetes mellitus without complications: Secondary | ICD-10-CM | POA: Diagnosis not present

## 2020-01-28 DIAGNOSIS — J449 Chronic obstructive pulmonary disease, unspecified: Secondary | ICD-10-CM | POA: Diagnosis not present

## 2020-01-28 DIAGNOSIS — I10 Essential (primary) hypertension: Secondary | ICD-10-CM | POA: Diagnosis not present

## 2020-01-31 DIAGNOSIS — E1169 Type 2 diabetes mellitus with other specified complication: Secondary | ICD-10-CM | POA: Diagnosis not present

## 2020-02-02 DIAGNOSIS — E1140 Type 2 diabetes mellitus with diabetic neuropathy, unspecified: Secondary | ICD-10-CM | POA: Diagnosis not present

## 2020-02-02 DIAGNOSIS — M79605 Pain in left leg: Secondary | ICD-10-CM | POA: Diagnosis not present

## 2020-02-02 DIAGNOSIS — E1159 Type 2 diabetes mellitus with other circulatory complications: Secondary | ICD-10-CM | POA: Diagnosis not present

## 2020-02-02 DIAGNOSIS — M79604 Pain in right leg: Secondary | ICD-10-CM | POA: Diagnosis not present

## 2020-02-03 ENCOUNTER — Other Ambulatory Visit: Payer: Self-pay | Admitting: Orthopedic Surgery

## 2020-02-03 DIAGNOSIS — M545 Low back pain, unspecified: Secondary | ICD-10-CM

## 2020-02-03 DIAGNOSIS — M79604 Pain in right leg: Secondary | ICD-10-CM

## 2020-02-12 DIAGNOSIS — Z299 Encounter for prophylactic measures, unspecified: Secondary | ICD-10-CM | POA: Diagnosis not present

## 2020-02-12 DIAGNOSIS — J449 Chronic obstructive pulmonary disease, unspecified: Secondary | ICD-10-CM | POA: Diagnosis not present

## 2020-02-12 DIAGNOSIS — I700 Atherosclerosis of aorta: Secondary | ICD-10-CM | POA: Diagnosis not present

## 2020-02-12 DIAGNOSIS — I10 Essential (primary) hypertension: Secondary | ICD-10-CM | POA: Diagnosis not present

## 2020-02-12 DIAGNOSIS — E1165 Type 2 diabetes mellitus with hyperglycemia: Secondary | ICD-10-CM | POA: Diagnosis not present

## 2020-02-19 DIAGNOSIS — R001 Bradycardia, unspecified: Secondary | ICD-10-CM | POA: Diagnosis not present

## 2020-02-19 DIAGNOSIS — R29818 Other symptoms and signs involving the nervous system: Secondary | ICD-10-CM | POA: Diagnosis not present

## 2020-02-19 DIAGNOSIS — G9340 Encephalopathy, unspecified: Secondary | ICD-10-CM | POA: Diagnosis not present

## 2020-02-19 DIAGNOSIS — G92 Toxic encephalopathy: Secondary | ICD-10-CM | POA: Diagnosis not present

## 2020-02-19 DIAGNOSIS — R404 Transient alteration of awareness: Secondary | ICD-10-CM | POA: Diagnosis not present

## 2020-02-19 DIAGNOSIS — Z20822 Contact with and (suspected) exposure to covid-19: Secondary | ICD-10-CM | POA: Diagnosis not present

## 2020-02-19 DIAGNOSIS — E1165 Type 2 diabetes mellitus with hyperglycemia: Secondary | ICD-10-CM | POA: Diagnosis not present

## 2020-02-19 DIAGNOSIS — R4020 Unspecified coma: Secondary | ICD-10-CM | POA: Diagnosis not present

## 2020-02-19 DIAGNOSIS — S0990XA Unspecified injury of head, initial encounter: Secondary | ICD-10-CM | POA: Diagnosis not present

## 2020-02-19 DIAGNOSIS — S299XXA Unspecified injury of thorax, initial encounter: Secondary | ICD-10-CM | POA: Diagnosis not present

## 2020-02-19 DIAGNOSIS — M5030 Other cervical disc degeneration, unspecified cervical region: Secondary | ICD-10-CM | POA: Diagnosis not present

## 2020-02-19 DIAGNOSIS — Z743 Need for continuous supervision: Secondary | ICD-10-CM | POA: Diagnosis not present

## 2020-02-19 DIAGNOSIS — E876 Hypokalemia: Secondary | ICD-10-CM | POA: Diagnosis not present

## 2020-02-19 DIAGNOSIS — F17200 Nicotine dependence, unspecified, uncomplicated: Secondary | ICD-10-CM | POA: Diagnosis not present

## 2020-02-29 DIAGNOSIS — I700 Atherosclerosis of aorta: Secondary | ICD-10-CM | POA: Diagnosis not present

## 2020-02-29 DIAGNOSIS — R197 Diarrhea, unspecified: Secondary | ICD-10-CM | POA: Diagnosis not present

## 2020-02-29 DIAGNOSIS — R410 Disorientation, unspecified: Secondary | ICD-10-CM | POA: Diagnosis not present

## 2020-02-29 DIAGNOSIS — J449 Chronic obstructive pulmonary disease, unspecified: Secondary | ICD-10-CM | POA: Diagnosis not present

## 2020-02-29 DIAGNOSIS — I10 Essential (primary) hypertension: Secondary | ICD-10-CM | POA: Diagnosis not present

## 2020-02-29 DIAGNOSIS — Z299 Encounter for prophylactic measures, unspecified: Secondary | ICD-10-CM | POA: Diagnosis not present

## 2020-03-01 DIAGNOSIS — E1169 Type 2 diabetes mellitus with other specified complication: Secondary | ICD-10-CM | POA: Diagnosis not present

## 2020-03-14 DIAGNOSIS — Z299 Encounter for prophylactic measures, unspecified: Secondary | ICD-10-CM | POA: Diagnosis not present

## 2020-03-14 DIAGNOSIS — I10 Essential (primary) hypertension: Secondary | ICD-10-CM | POA: Diagnosis not present

## 2020-03-14 DIAGNOSIS — M545 Low back pain: Secondary | ICD-10-CM | POA: Diagnosis not present

## 2020-03-14 DIAGNOSIS — N644 Mastodynia: Secondary | ICD-10-CM | POA: Diagnosis not present

## 2020-03-14 DIAGNOSIS — B029 Zoster without complications: Secondary | ICD-10-CM | POA: Diagnosis not present

## 2020-03-17 DIAGNOSIS — Z794 Long term (current) use of insulin: Secondary | ICD-10-CM | POA: Diagnosis not present

## 2020-03-17 DIAGNOSIS — R531 Weakness: Secondary | ICD-10-CM | POA: Diagnosis not present

## 2020-03-17 DIAGNOSIS — N179 Acute kidney failure, unspecified: Secondary | ICD-10-CM | POA: Diagnosis not present

## 2020-03-17 DIAGNOSIS — E11649 Type 2 diabetes mellitus with hypoglycemia without coma: Secondary | ICD-10-CM | POA: Diagnosis not present

## 2020-03-18 DIAGNOSIS — N179 Acute kidney failure, unspecified: Secondary | ICD-10-CM | POA: Diagnosis not present

## 2020-03-18 DIAGNOSIS — G9340 Encephalopathy, unspecified: Secondary | ICD-10-CM | POA: Diagnosis not present

## 2020-03-18 DIAGNOSIS — R531 Weakness: Secondary | ICD-10-CM | POA: Diagnosis not present

## 2020-03-18 DIAGNOSIS — E119 Type 2 diabetes mellitus without complications: Secondary | ICD-10-CM | POA: Diagnosis not present

## 2020-03-18 DIAGNOSIS — B029 Zoster without complications: Secondary | ICD-10-CM | POA: Diagnosis not present

## 2020-03-18 DIAGNOSIS — R55 Syncope and collapse: Secondary | ICD-10-CM | POA: Diagnosis not present

## 2020-03-19 DIAGNOSIS — E119 Type 2 diabetes mellitus without complications: Secondary | ICD-10-CM | POA: Diagnosis not present

## 2020-03-19 DIAGNOSIS — R55 Syncope and collapse: Secondary | ICD-10-CM | POA: Diagnosis not present

## 2020-03-19 DIAGNOSIS — N179 Acute kidney failure, unspecified: Secondary | ICD-10-CM | POA: Diagnosis not present

## 2020-03-19 DIAGNOSIS — B029 Zoster without complications: Secondary | ICD-10-CM | POA: Diagnosis not present

## 2020-03-20 DIAGNOSIS — B029 Zoster without complications: Secondary | ICD-10-CM | POA: Diagnosis not present

## 2020-03-20 DIAGNOSIS — R55 Syncope and collapse: Secondary | ICD-10-CM | POA: Diagnosis not present

## 2020-03-20 DIAGNOSIS — N179 Acute kidney failure, unspecified: Secondary | ICD-10-CM | POA: Diagnosis not present

## 2020-03-20 DIAGNOSIS — E119 Type 2 diabetes mellitus without complications: Secondary | ICD-10-CM | POA: Diagnosis not present

## 2020-03-21 DIAGNOSIS — M25561 Pain in right knee: Secondary | ICD-10-CM | POA: Diagnosis not present

## 2020-03-21 DIAGNOSIS — R55 Syncope and collapse: Secondary | ICD-10-CM | POA: Diagnosis not present

## 2020-03-21 DIAGNOSIS — M6281 Muscle weakness (generalized): Secondary | ICD-10-CM | POA: Diagnosis not present

## 2020-03-21 DIAGNOSIS — N179 Acute kidney failure, unspecified: Secondary | ICD-10-CM | POA: Diagnosis not present

## 2020-03-21 DIAGNOSIS — E039 Hypothyroidism, unspecified: Secondary | ICD-10-CM | POA: Diagnosis not present

## 2020-03-21 DIAGNOSIS — E1165 Type 2 diabetes mellitus with hyperglycemia: Secondary | ICD-10-CM | POA: Diagnosis not present

## 2020-03-21 DIAGNOSIS — R2689 Other abnormalities of gait and mobility: Secondary | ICD-10-CM | POA: Diagnosis not present

## 2020-03-21 DIAGNOSIS — E119 Type 2 diabetes mellitus without complications: Secondary | ICD-10-CM | POA: Diagnosis not present

## 2020-03-21 DIAGNOSIS — Z7984 Long term (current) use of oral hypoglycemic drugs: Secondary | ICD-10-CM | POA: Diagnosis not present

## 2020-03-21 DIAGNOSIS — Z20822 Contact with and (suspected) exposure to covid-19: Secondary | ICD-10-CM | POA: Diagnosis not present

## 2020-03-21 DIAGNOSIS — B029 Zoster without complications: Secondary | ICD-10-CM | POA: Diagnosis not present

## 2020-03-21 DIAGNOSIS — R531 Weakness: Secondary | ICD-10-CM | POA: Diagnosis not present

## 2020-03-21 DIAGNOSIS — J449 Chronic obstructive pulmonary disease, unspecified: Secondary | ICD-10-CM | POA: Diagnosis not present

## 2020-03-21 DIAGNOSIS — Z87891 Personal history of nicotine dependence: Secondary | ICD-10-CM | POA: Diagnosis not present

## 2020-03-21 DIAGNOSIS — E785 Hyperlipidemia, unspecified: Secondary | ICD-10-CM | POA: Diagnosis not present

## 2020-03-21 DIAGNOSIS — E11649 Type 2 diabetes mellitus with hypoglycemia without coma: Secondary | ICD-10-CM | POA: Diagnosis not present

## 2020-03-21 DIAGNOSIS — Z794 Long term (current) use of insulin: Secondary | ICD-10-CM | POA: Diagnosis not present

## 2020-03-21 DIAGNOSIS — E1140 Type 2 diabetes mellitus with diabetic neuropathy, unspecified: Secondary | ICD-10-CM | POA: Diagnosis not present

## 2020-03-21 DIAGNOSIS — I10 Essential (primary) hypertension: Secondary | ICD-10-CM | POA: Diagnosis not present

## 2020-03-23 DIAGNOSIS — R55 Syncope and collapse: Secondary | ICD-10-CM | POA: Diagnosis not present

## 2020-03-23 DIAGNOSIS — N179 Acute kidney failure, unspecified: Secondary | ICD-10-CM | POA: Diagnosis not present

## 2020-03-23 DIAGNOSIS — M549 Dorsalgia, unspecified: Secondary | ICD-10-CM | POA: Diagnosis not present

## 2020-03-23 DIAGNOSIS — R0781 Pleurodynia: Secondary | ICD-10-CM | POA: Diagnosis not present

## 2020-03-23 DIAGNOSIS — M546 Pain in thoracic spine: Secondary | ICD-10-CM | POA: Diagnosis not present

## 2020-03-23 DIAGNOSIS — R0789 Other chest pain: Secondary | ICD-10-CM | POA: Diagnosis not present

## 2020-03-23 DIAGNOSIS — R2689 Other abnormalities of gait and mobility: Secondary | ICD-10-CM | POA: Diagnosis not present

## 2020-03-23 DIAGNOSIS — J449 Chronic obstructive pulmonary disease, unspecified: Secondary | ICD-10-CM | POA: Diagnosis not present

## 2020-03-23 DIAGNOSIS — E1165 Type 2 diabetes mellitus with hyperglycemia: Secondary | ICD-10-CM | POA: Diagnosis not present

## 2020-03-23 DIAGNOSIS — K449 Diaphragmatic hernia without obstruction or gangrene: Secondary | ICD-10-CM | POA: Diagnosis not present

## 2020-03-23 DIAGNOSIS — G92 Toxic encephalopathy: Secondary | ICD-10-CM | POA: Diagnosis not present

## 2020-03-23 DIAGNOSIS — Z794 Long term (current) use of insulin: Secondary | ICD-10-CM | POA: Diagnosis not present

## 2020-03-23 DIAGNOSIS — B029 Zoster without complications: Secondary | ICD-10-CM | POA: Diagnosis not present

## 2020-03-23 DIAGNOSIS — I700 Atherosclerosis of aorta: Secondary | ICD-10-CM | POA: Diagnosis not present

## 2020-03-23 DIAGNOSIS — M25561 Pain in right knee: Secondary | ICD-10-CM | POA: Diagnosis not present

## 2020-03-23 DIAGNOSIS — R079 Chest pain, unspecified: Secondary | ICD-10-CM | POA: Diagnosis not present

## 2020-03-23 DIAGNOSIS — R918 Other nonspecific abnormal finding of lung field: Secondary | ICD-10-CM | POA: Diagnosis not present

## 2020-03-23 DIAGNOSIS — R911 Solitary pulmonary nodule: Secondary | ICD-10-CM | POA: Diagnosis not present

## 2020-03-23 DIAGNOSIS — M6281 Muscle weakness (generalized): Secondary | ICD-10-CM | POA: Diagnosis not present

## 2020-03-23 DIAGNOSIS — E119 Type 2 diabetes mellitus without complications: Secondary | ICD-10-CM | POA: Diagnosis not present

## 2020-03-23 DIAGNOSIS — I10 Essential (primary) hypertension: Secondary | ICD-10-CM | POA: Diagnosis not present

## 2020-03-23 DIAGNOSIS — E1140 Type 2 diabetes mellitus with diabetic neuropathy, unspecified: Secondary | ICD-10-CM | POA: Diagnosis not present

## 2020-03-25 DIAGNOSIS — M25561 Pain in right knee: Secondary | ICD-10-CM | POA: Diagnosis not present

## 2020-03-25 DIAGNOSIS — G92 Toxic encephalopathy: Secondary | ICD-10-CM | POA: Diagnosis not present

## 2020-03-25 DIAGNOSIS — R55 Syncope and collapse: Secondary | ICD-10-CM | POA: Diagnosis not present

## 2020-03-25 DIAGNOSIS — I10 Essential (primary) hypertension: Secondary | ICD-10-CM | POA: Diagnosis not present

## 2020-03-31 DIAGNOSIS — R0789 Other chest pain: Secondary | ICD-10-CM | POA: Diagnosis not present

## 2020-03-31 DIAGNOSIS — M549 Dorsalgia, unspecified: Secondary | ICD-10-CM | POA: Diagnosis not present

## 2020-04-05 DIAGNOSIS — E119 Type 2 diabetes mellitus without complications: Secondary | ICD-10-CM | POA: Diagnosis not present

## 2020-04-05 DIAGNOSIS — R0781 Pleurodynia: Secondary | ICD-10-CM | POA: Diagnosis not present

## 2020-04-07 DIAGNOSIS — R0781 Pleurodynia: Secondary | ICD-10-CM | POA: Diagnosis not present

## 2020-04-07 DIAGNOSIS — E119 Type 2 diabetes mellitus without complications: Secondary | ICD-10-CM | POA: Diagnosis not present

## 2020-04-08 DIAGNOSIS — I700 Atherosclerosis of aorta: Secondary | ICD-10-CM | POA: Diagnosis not present

## 2020-04-08 DIAGNOSIS — K449 Diaphragmatic hernia without obstruction or gangrene: Secondary | ICD-10-CM | POA: Diagnosis not present

## 2020-04-08 DIAGNOSIS — R918 Other nonspecific abnormal finding of lung field: Secondary | ICD-10-CM | POA: Diagnosis not present

## 2020-04-08 DIAGNOSIS — R0781 Pleurodynia: Secondary | ICD-10-CM | POA: Diagnosis not present

## 2020-04-08 DIAGNOSIS — R911 Solitary pulmonary nodule: Secondary | ICD-10-CM | POA: Diagnosis not present

## 2020-04-11 DIAGNOSIS — G92 Toxic encephalopathy: Secondary | ICD-10-CM | POA: Diagnosis not present

## 2020-04-11 DIAGNOSIS — I10 Essential (primary) hypertension: Secondary | ICD-10-CM | POA: Diagnosis not present

## 2020-04-11 DIAGNOSIS — E1140 Type 2 diabetes mellitus with diabetic neuropathy, unspecified: Secondary | ICD-10-CM | POA: Diagnosis not present

## 2020-04-11 DIAGNOSIS — M810 Age-related osteoporosis without current pathological fracture: Secondary | ICD-10-CM | POA: Diagnosis not present

## 2020-04-11 DIAGNOSIS — J449 Chronic obstructive pulmonary disease, unspecified: Secondary | ICD-10-CM | POA: Diagnosis not present

## 2020-04-12 DIAGNOSIS — Z09 Encounter for follow-up examination after completed treatment for conditions other than malignant neoplasm: Secondary | ICD-10-CM | POA: Diagnosis not present

## 2020-04-12 DIAGNOSIS — E1165 Type 2 diabetes mellitus with hyperglycemia: Secondary | ICD-10-CM | POA: Diagnosis not present

## 2020-04-12 DIAGNOSIS — J449 Chronic obstructive pulmonary disease, unspecified: Secondary | ICD-10-CM | POA: Diagnosis not present

## 2020-04-12 DIAGNOSIS — I10 Essential (primary) hypertension: Secondary | ICD-10-CM | POA: Diagnosis not present

## 2020-04-12 DIAGNOSIS — Z299 Encounter for prophylactic measures, unspecified: Secondary | ICD-10-CM | POA: Diagnosis not present

## 2020-04-14 DIAGNOSIS — E1140 Type 2 diabetes mellitus with diabetic neuropathy, unspecified: Secondary | ICD-10-CM | POA: Diagnosis not present

## 2020-04-14 DIAGNOSIS — M810 Age-related osteoporosis without current pathological fracture: Secondary | ICD-10-CM | POA: Diagnosis not present

## 2020-04-14 DIAGNOSIS — J449 Chronic obstructive pulmonary disease, unspecified: Secondary | ICD-10-CM | POA: Diagnosis not present

## 2020-04-14 DIAGNOSIS — G92 Toxic encephalopathy: Secondary | ICD-10-CM | POA: Diagnosis not present

## 2020-04-14 DIAGNOSIS — I10 Essential (primary) hypertension: Secondary | ICD-10-CM | POA: Diagnosis not present

## 2020-04-15 DIAGNOSIS — E1140 Type 2 diabetes mellitus with diabetic neuropathy, unspecified: Secondary | ICD-10-CM | POA: Diagnosis not present

## 2020-04-15 DIAGNOSIS — J449 Chronic obstructive pulmonary disease, unspecified: Secondary | ICD-10-CM | POA: Diagnosis not present

## 2020-04-15 DIAGNOSIS — I10 Essential (primary) hypertension: Secondary | ICD-10-CM | POA: Diagnosis not present

## 2020-04-15 DIAGNOSIS — M810 Age-related osteoporosis without current pathological fracture: Secondary | ICD-10-CM | POA: Diagnosis not present

## 2020-04-15 DIAGNOSIS — G92 Toxic encephalopathy: Secondary | ICD-10-CM | POA: Diagnosis not present

## 2020-04-19 DIAGNOSIS — G92 Toxic encephalopathy: Secondary | ICD-10-CM | POA: Diagnosis not present

## 2020-04-19 DIAGNOSIS — J449 Chronic obstructive pulmonary disease, unspecified: Secondary | ICD-10-CM | POA: Diagnosis not present

## 2020-04-19 DIAGNOSIS — E1140 Type 2 diabetes mellitus with diabetic neuropathy, unspecified: Secondary | ICD-10-CM | POA: Diagnosis not present

## 2020-04-19 DIAGNOSIS — I10 Essential (primary) hypertension: Secondary | ICD-10-CM | POA: Diagnosis not present

## 2020-04-19 DIAGNOSIS — M810 Age-related osteoporosis without current pathological fracture: Secondary | ICD-10-CM | POA: Diagnosis not present

## 2020-04-20 DIAGNOSIS — G92 Toxic encephalopathy: Secondary | ICD-10-CM | POA: Diagnosis not present

## 2020-04-20 DIAGNOSIS — M810 Age-related osteoporosis without current pathological fracture: Secondary | ICD-10-CM | POA: Diagnosis not present

## 2020-04-20 DIAGNOSIS — J449 Chronic obstructive pulmonary disease, unspecified: Secondary | ICD-10-CM | POA: Diagnosis not present

## 2020-04-20 DIAGNOSIS — I10 Essential (primary) hypertension: Secondary | ICD-10-CM | POA: Diagnosis not present

## 2020-04-20 DIAGNOSIS — E1140 Type 2 diabetes mellitus with diabetic neuropathy, unspecified: Secondary | ICD-10-CM | POA: Diagnosis not present

## 2020-04-20 DIAGNOSIS — Z299 Encounter for prophylactic measures, unspecified: Secondary | ICD-10-CM | POA: Diagnosis not present

## 2020-04-20 DIAGNOSIS — E1165 Type 2 diabetes mellitus with hyperglycemia: Secondary | ICD-10-CM | POA: Diagnosis not present

## 2020-04-21 DIAGNOSIS — I10 Essential (primary) hypertension: Secondary | ICD-10-CM | POA: Diagnosis not present

## 2020-04-21 DIAGNOSIS — J449 Chronic obstructive pulmonary disease, unspecified: Secondary | ICD-10-CM | POA: Diagnosis not present

## 2020-04-21 DIAGNOSIS — E1140 Type 2 diabetes mellitus with diabetic neuropathy, unspecified: Secondary | ICD-10-CM | POA: Diagnosis not present

## 2020-04-21 DIAGNOSIS — M810 Age-related osteoporosis without current pathological fracture: Secondary | ICD-10-CM | POA: Diagnosis not present

## 2020-04-21 DIAGNOSIS — G92 Toxic encephalopathy: Secondary | ICD-10-CM | POA: Diagnosis not present

## 2020-04-22 DIAGNOSIS — G92 Toxic encephalopathy: Secondary | ICD-10-CM | POA: Diagnosis not present

## 2020-04-22 DIAGNOSIS — E1140 Type 2 diabetes mellitus with diabetic neuropathy, unspecified: Secondary | ICD-10-CM | POA: Diagnosis not present

## 2020-04-22 DIAGNOSIS — I10 Essential (primary) hypertension: Secondary | ICD-10-CM | POA: Diagnosis not present

## 2020-04-22 DIAGNOSIS — J449 Chronic obstructive pulmonary disease, unspecified: Secondary | ICD-10-CM | POA: Diagnosis not present

## 2020-04-22 DIAGNOSIS — M810 Age-related osteoporosis without current pathological fracture: Secondary | ICD-10-CM | POA: Diagnosis not present

## 2020-04-26 DIAGNOSIS — I10 Essential (primary) hypertension: Secondary | ICD-10-CM | POA: Diagnosis not present

## 2020-04-26 DIAGNOSIS — M810 Age-related osteoporosis without current pathological fracture: Secondary | ICD-10-CM | POA: Diagnosis not present

## 2020-04-26 DIAGNOSIS — G92 Toxic encephalopathy: Secondary | ICD-10-CM | POA: Diagnosis not present

## 2020-04-26 DIAGNOSIS — E1140 Type 2 diabetes mellitus with diabetic neuropathy, unspecified: Secondary | ICD-10-CM | POA: Diagnosis not present

## 2020-04-26 DIAGNOSIS — J449 Chronic obstructive pulmonary disease, unspecified: Secondary | ICD-10-CM | POA: Diagnosis not present

## 2020-04-27 DIAGNOSIS — E1140 Type 2 diabetes mellitus with diabetic neuropathy, unspecified: Secondary | ICD-10-CM | POA: Diagnosis not present

## 2020-04-27 DIAGNOSIS — I10 Essential (primary) hypertension: Secondary | ICD-10-CM | POA: Diagnosis not present

## 2020-04-27 DIAGNOSIS — G92 Toxic encephalopathy: Secondary | ICD-10-CM | POA: Diagnosis not present

## 2020-04-27 DIAGNOSIS — M810 Age-related osteoporosis without current pathological fracture: Secondary | ICD-10-CM | POA: Diagnosis not present

## 2020-04-27 DIAGNOSIS — J449 Chronic obstructive pulmonary disease, unspecified: Secondary | ICD-10-CM | POA: Diagnosis not present

## 2020-04-28 DIAGNOSIS — E1140 Type 2 diabetes mellitus with diabetic neuropathy, unspecified: Secondary | ICD-10-CM | POA: Diagnosis not present

## 2020-04-28 DIAGNOSIS — I10 Essential (primary) hypertension: Secondary | ICD-10-CM | POA: Diagnosis not present

## 2020-04-28 DIAGNOSIS — G92 Toxic encephalopathy: Secondary | ICD-10-CM | POA: Diagnosis not present

## 2020-04-28 DIAGNOSIS — M810 Age-related osteoporosis without current pathological fracture: Secondary | ICD-10-CM | POA: Diagnosis not present

## 2020-04-28 DIAGNOSIS — J449 Chronic obstructive pulmonary disease, unspecified: Secondary | ICD-10-CM | POA: Diagnosis not present

## 2020-04-29 DIAGNOSIS — Z1211 Encounter for screening for malignant neoplasm of colon: Secondary | ICD-10-CM | POA: Diagnosis not present

## 2020-04-29 DIAGNOSIS — I10 Essential (primary) hypertension: Secondary | ICD-10-CM | POA: Diagnosis not present

## 2020-04-29 DIAGNOSIS — Z299 Encounter for prophylactic measures, unspecified: Secondary | ICD-10-CM | POA: Diagnosis not present

## 2020-04-29 DIAGNOSIS — Z6824 Body mass index (BMI) 24.0-24.9, adult: Secondary | ICD-10-CM | POA: Diagnosis not present

## 2020-04-29 DIAGNOSIS — E1165 Type 2 diabetes mellitus with hyperglycemia: Secondary | ICD-10-CM | POA: Diagnosis not present

## 2020-04-29 DIAGNOSIS — Z7189 Other specified counseling: Secondary | ICD-10-CM | POA: Diagnosis not present

## 2020-04-29 DIAGNOSIS — Z0000 Encounter for general adult medical examination without abnormal findings: Secondary | ICD-10-CM | POA: Diagnosis not present

## 2020-04-29 DIAGNOSIS — E785 Hyperlipidemia, unspecified: Secondary | ICD-10-CM | POA: Diagnosis not present

## 2020-05-01 DIAGNOSIS — E1169 Type 2 diabetes mellitus with other specified complication: Secondary | ICD-10-CM | POA: Diagnosis not present

## 2020-05-03 DIAGNOSIS — E1140 Type 2 diabetes mellitus with diabetic neuropathy, unspecified: Secondary | ICD-10-CM | POA: Diagnosis not present

## 2020-05-03 DIAGNOSIS — I10 Essential (primary) hypertension: Secondary | ICD-10-CM | POA: Diagnosis not present

## 2020-05-03 DIAGNOSIS — G92 Toxic encephalopathy: Secondary | ICD-10-CM | POA: Diagnosis not present

## 2020-05-03 DIAGNOSIS — M810 Age-related osteoporosis without current pathological fracture: Secondary | ICD-10-CM | POA: Diagnosis not present

## 2020-05-03 DIAGNOSIS — J449 Chronic obstructive pulmonary disease, unspecified: Secondary | ICD-10-CM | POA: Diagnosis not present

## 2020-05-06 DIAGNOSIS — M810 Age-related osteoporosis without current pathological fracture: Secondary | ICD-10-CM | POA: Diagnosis not present

## 2020-05-06 DIAGNOSIS — E1140 Type 2 diabetes mellitus with diabetic neuropathy, unspecified: Secondary | ICD-10-CM | POA: Diagnosis not present

## 2020-05-06 DIAGNOSIS — I10 Essential (primary) hypertension: Secondary | ICD-10-CM | POA: Diagnosis not present

## 2020-05-06 DIAGNOSIS — G92 Toxic encephalopathy: Secondary | ICD-10-CM | POA: Diagnosis not present

## 2020-05-06 DIAGNOSIS — J449 Chronic obstructive pulmonary disease, unspecified: Secondary | ICD-10-CM | POA: Diagnosis not present

## 2020-06-01 DIAGNOSIS — E1169 Type 2 diabetes mellitus with other specified complication: Secondary | ICD-10-CM | POA: Diagnosis not present

## 2020-06-08 DIAGNOSIS — Z299 Encounter for prophylactic measures, unspecified: Secondary | ICD-10-CM | POA: Diagnosis not present

## 2020-06-08 DIAGNOSIS — B029 Zoster without complications: Secondary | ICD-10-CM | POA: Diagnosis not present

## 2020-06-08 DIAGNOSIS — J449 Chronic obstructive pulmonary disease, unspecified: Secondary | ICD-10-CM | POA: Diagnosis not present

## 2020-06-08 DIAGNOSIS — R109 Unspecified abdominal pain: Secondary | ICD-10-CM | POA: Diagnosis not present

## 2020-06-08 DIAGNOSIS — M545 Low back pain: Secondary | ICD-10-CM | POA: Diagnosis not present

## 2020-06-08 DIAGNOSIS — I10 Essential (primary) hypertension: Secondary | ICD-10-CM | POA: Diagnosis not present

## 2020-06-15 DIAGNOSIS — R109 Unspecified abdominal pain: Secondary | ICD-10-CM | POA: Diagnosis not present

## 2020-06-15 DIAGNOSIS — I10 Essential (primary) hypertension: Secondary | ICD-10-CM | POA: Diagnosis not present

## 2020-06-15 DIAGNOSIS — M47816 Spondylosis without myelopathy or radiculopathy, lumbar region: Secondary | ICD-10-CM | POA: Diagnosis not present

## 2020-06-15 DIAGNOSIS — R350 Frequency of micturition: Secondary | ICD-10-CM | POA: Diagnosis not present

## 2020-06-15 DIAGNOSIS — J449 Chronic obstructive pulmonary disease, unspecified: Secondary | ICD-10-CM | POA: Diagnosis not present

## 2020-06-15 DIAGNOSIS — M549 Dorsalgia, unspecified: Secondary | ICD-10-CM | POA: Diagnosis not present

## 2020-06-15 DIAGNOSIS — Z299 Encounter for prophylactic measures, unspecified: Secondary | ICD-10-CM | POA: Diagnosis not present

## 2020-06-29 DIAGNOSIS — E1165 Type 2 diabetes mellitus with hyperglycemia: Secondary | ICD-10-CM | POA: Diagnosis not present

## 2020-06-29 DIAGNOSIS — J449 Chronic obstructive pulmonary disease, unspecified: Secondary | ICD-10-CM | POA: Diagnosis not present

## 2020-06-29 DIAGNOSIS — M549 Dorsalgia, unspecified: Secondary | ICD-10-CM | POA: Diagnosis not present

## 2020-06-29 DIAGNOSIS — Z299 Encounter for prophylactic measures, unspecified: Secondary | ICD-10-CM | POA: Diagnosis not present

## 2020-06-29 DIAGNOSIS — I10 Essential (primary) hypertension: Secondary | ICD-10-CM | POA: Diagnosis not present

## 2020-07-01 DIAGNOSIS — I10 Essential (primary) hypertension: Secondary | ICD-10-CM | POA: Diagnosis not present

## 2020-07-01 DIAGNOSIS — E1169 Type 2 diabetes mellitus with other specified complication: Secondary | ICD-10-CM | POA: Diagnosis not present

## 2020-07-01 DIAGNOSIS — J449 Chronic obstructive pulmonary disease, unspecified: Secondary | ICD-10-CM | POA: Diagnosis not present

## 2020-07-11 DIAGNOSIS — J449 Chronic obstructive pulmonary disease, unspecified: Secondary | ICD-10-CM | POA: Diagnosis not present

## 2020-07-11 DIAGNOSIS — E1169 Type 2 diabetes mellitus with other specified complication: Secondary | ICD-10-CM | POA: Diagnosis not present

## 2020-07-11 DIAGNOSIS — I10 Essential (primary) hypertension: Secondary | ICD-10-CM | POA: Diagnosis not present

## 2020-07-15 DIAGNOSIS — M546 Pain in thoracic spine: Secondary | ICD-10-CM | POA: Diagnosis not present

## 2020-07-15 DIAGNOSIS — M47814 Spondylosis without myelopathy or radiculopathy, thoracic region: Secondary | ICD-10-CM | POA: Diagnosis not present

## 2020-08-02 DIAGNOSIS — E1169 Type 2 diabetes mellitus with other specified complication: Secondary | ICD-10-CM | POA: Diagnosis not present

## 2020-08-17 DIAGNOSIS — I10 Essential (primary) hypertension: Secondary | ICD-10-CM | POA: Diagnosis not present

## 2020-08-17 DIAGNOSIS — J449 Chronic obstructive pulmonary disease, unspecified: Secondary | ICD-10-CM | POA: Diagnosis not present

## 2020-08-17 DIAGNOSIS — M1990 Unspecified osteoarthritis, unspecified site: Secondary | ICD-10-CM | POA: Diagnosis not present

## 2020-08-17 DIAGNOSIS — Z299 Encounter for prophylactic measures, unspecified: Secondary | ICD-10-CM | POA: Diagnosis not present

## 2020-08-19 DIAGNOSIS — M545 Low back pain: Secondary | ICD-10-CM | POA: Diagnosis not present

## 2020-08-19 DIAGNOSIS — N390 Urinary tract infection, site not specified: Secondary | ICD-10-CM | POA: Diagnosis not present

## 2020-08-19 DIAGNOSIS — Z299 Encounter for prophylactic measures, unspecified: Secondary | ICD-10-CM | POA: Diagnosis not present

## 2020-08-19 DIAGNOSIS — E785 Hyperlipidemia, unspecified: Secondary | ICD-10-CM | POA: Diagnosis not present

## 2020-08-19 DIAGNOSIS — I10 Essential (primary) hypertension: Secondary | ICD-10-CM | POA: Diagnosis not present

## 2020-08-19 DIAGNOSIS — J449 Chronic obstructive pulmonary disease, unspecified: Secondary | ICD-10-CM | POA: Diagnosis not present

## 2020-08-26 DIAGNOSIS — N390 Urinary tract infection, site not specified: Secondary | ICD-10-CM | POA: Diagnosis not present

## 2020-09-01 DIAGNOSIS — E1169 Type 2 diabetes mellitus with other specified complication: Secondary | ICD-10-CM | POA: Diagnosis not present

## 2020-09-01 DIAGNOSIS — J449 Chronic obstructive pulmonary disease, unspecified: Secondary | ICD-10-CM | POA: Diagnosis not present

## 2020-09-01 DIAGNOSIS — I10 Essential (primary) hypertension: Secondary | ICD-10-CM | POA: Diagnosis not present

## 2020-09-07 DIAGNOSIS — E1165 Type 2 diabetes mellitus with hyperglycemia: Secondary | ICD-10-CM | POA: Diagnosis not present

## 2020-09-07 DIAGNOSIS — I10 Essential (primary) hypertension: Secondary | ICD-10-CM | POA: Diagnosis not present

## 2020-09-07 DIAGNOSIS — J449 Chronic obstructive pulmonary disease, unspecified: Secondary | ICD-10-CM | POA: Diagnosis not present

## 2020-09-07 DIAGNOSIS — N390 Urinary tract infection, site not specified: Secondary | ICD-10-CM | POA: Diagnosis not present

## 2020-09-07 DIAGNOSIS — Z299 Encounter for prophylactic measures, unspecified: Secondary | ICD-10-CM | POA: Diagnosis not present

## 2020-09-30 DIAGNOSIS — I10 Essential (primary) hypertension: Secondary | ICD-10-CM | POA: Diagnosis not present

## 2020-09-30 DIAGNOSIS — J449 Chronic obstructive pulmonary disease, unspecified: Secondary | ICD-10-CM | POA: Diagnosis not present

## 2020-09-30 DIAGNOSIS — E1169 Type 2 diabetes mellitus with other specified complication: Secondary | ICD-10-CM | POA: Diagnosis not present

## 2020-10-01 DIAGNOSIS — E1169 Type 2 diabetes mellitus with other specified complication: Secondary | ICD-10-CM | POA: Diagnosis not present

## 2020-10-05 DIAGNOSIS — Z299 Encounter for prophylactic measures, unspecified: Secondary | ICD-10-CM | POA: Diagnosis not present

## 2020-10-05 DIAGNOSIS — E1140 Type 2 diabetes mellitus with diabetic neuropathy, unspecified: Secondary | ICD-10-CM | POA: Diagnosis not present

## 2020-10-05 DIAGNOSIS — M25551 Pain in right hip: Secondary | ICD-10-CM | POA: Diagnosis not present

## 2020-10-05 DIAGNOSIS — J449 Chronic obstructive pulmonary disease, unspecified: Secondary | ICD-10-CM | POA: Diagnosis not present

## 2020-10-17 DIAGNOSIS — M7651 Patellar tendinitis, right knee: Secondary | ICD-10-CM | POA: Diagnosis not present

## 2020-10-17 DIAGNOSIS — M25561 Pain in right knee: Secondary | ICD-10-CM | POA: Diagnosis not present

## 2020-10-26 DIAGNOSIS — R2689 Other abnormalities of gait and mobility: Secondary | ICD-10-CM | POA: Diagnosis not present

## 2020-10-26 DIAGNOSIS — M25561 Pain in right knee: Secondary | ICD-10-CM | POA: Diagnosis not present

## 2020-10-28 DIAGNOSIS — I10 Essential (primary) hypertension: Secondary | ICD-10-CM | POA: Diagnosis not present

## 2020-10-28 DIAGNOSIS — R001 Bradycardia, unspecified: Secondary | ICD-10-CM | POA: Diagnosis not present

## 2020-10-28 DIAGNOSIS — Z299 Encounter for prophylactic measures, unspecified: Secondary | ICD-10-CM | POA: Diagnosis not present

## 2020-10-28 DIAGNOSIS — R42 Dizziness and giddiness: Secondary | ICD-10-CM | POA: Diagnosis not present

## 2020-10-28 DIAGNOSIS — J449 Chronic obstructive pulmonary disease, unspecified: Secondary | ICD-10-CM | POA: Diagnosis not present

## 2020-11-01 DIAGNOSIS — I10 Essential (primary) hypertension: Secondary | ICD-10-CM | POA: Diagnosis not present

## 2020-11-01 DIAGNOSIS — E1169 Type 2 diabetes mellitus with other specified complication: Secondary | ICD-10-CM | POA: Diagnosis not present

## 2020-11-01 DIAGNOSIS — J449 Chronic obstructive pulmonary disease, unspecified: Secondary | ICD-10-CM | POA: Diagnosis not present

## 2020-11-10 DIAGNOSIS — J449 Chronic obstructive pulmonary disease, unspecified: Secondary | ICD-10-CM | POA: Diagnosis not present

## 2020-11-10 DIAGNOSIS — I10 Essential (primary) hypertension: Secondary | ICD-10-CM | POA: Diagnosis not present

## 2020-11-10 DIAGNOSIS — Z299 Encounter for prophylactic measures, unspecified: Secondary | ICD-10-CM | POA: Diagnosis not present

## 2020-11-28 DIAGNOSIS — M7651 Patellar tendinitis, right knee: Secondary | ICD-10-CM | POA: Diagnosis not present

## 2020-12-01 DIAGNOSIS — I10 Essential (primary) hypertension: Secondary | ICD-10-CM | POA: Diagnosis not present

## 2020-12-01 DIAGNOSIS — J449 Chronic obstructive pulmonary disease, unspecified: Secondary | ICD-10-CM | POA: Diagnosis not present

## 2020-12-01 DIAGNOSIS — E1169 Type 2 diabetes mellitus with other specified complication: Secondary | ICD-10-CM | POA: Diagnosis not present

## 2020-12-03 DIAGNOSIS — I214 Non-ST elevation (NSTEMI) myocardial infarction: Secondary | ICD-10-CM

## 2020-12-03 HISTORY — DX: Non-ST elevation (NSTEMI) myocardial infarction: I21.4

## 2020-12-07 DIAGNOSIS — R778 Other specified abnormalities of plasma proteins: Secondary | ICD-10-CM | POA: Diagnosis not present

## 2020-12-07 DIAGNOSIS — I517 Cardiomegaly: Secondary | ICD-10-CM | POA: Diagnosis not present

## 2020-12-07 DIAGNOSIS — I10 Essential (primary) hypertension: Secondary | ICD-10-CM | POA: Diagnosis not present

## 2020-12-07 DIAGNOSIS — R9431 Abnormal electrocardiogram [ECG] [EKG]: Secondary | ICD-10-CM | POA: Diagnosis not present

## 2020-12-07 DIAGNOSIS — I6782 Cerebral ischemia: Secondary | ICD-10-CM | POA: Diagnosis not present

## 2020-12-07 DIAGNOSIS — I214 Non-ST elevation (NSTEMI) myocardial infarction: Secondary | ICD-10-CM | POA: Diagnosis not present

## 2020-12-07 DIAGNOSIS — E119 Type 2 diabetes mellitus without complications: Secondary | ICD-10-CM | POA: Diagnosis not present

## 2020-12-07 DIAGNOSIS — Z20822 Contact with and (suspected) exposure to covid-19: Secondary | ICD-10-CM | POA: Diagnosis not present

## 2020-12-07 DIAGNOSIS — R072 Precordial pain: Secondary | ICD-10-CM | POA: Diagnosis not present

## 2020-12-07 DIAGNOSIS — I700 Atherosclerosis of aorta: Secondary | ICD-10-CM | POA: Diagnosis not present

## 2020-12-07 DIAGNOSIS — Z87891 Personal history of nicotine dependence: Secondary | ICD-10-CM | POA: Diagnosis not present

## 2020-12-07 DIAGNOSIS — R4020 Unspecified coma: Secondary | ICD-10-CM | POA: Diagnosis not present

## 2020-12-07 DIAGNOSIS — R079 Chest pain, unspecified: Secondary | ICD-10-CM | POA: Diagnosis not present

## 2020-12-08 DIAGNOSIS — R4020 Unspecified coma: Secondary | ICD-10-CM | POA: Diagnosis not present

## 2020-12-09 DIAGNOSIS — Z794 Long term (current) use of insulin: Secondary | ICD-10-CM | POA: Diagnosis not present

## 2020-12-09 DIAGNOSIS — I2510 Atherosclerotic heart disease of native coronary artery without angina pectoris: Secondary | ICD-10-CM | POA: Diagnosis not present

## 2020-12-09 DIAGNOSIS — Z955 Presence of coronary angioplasty implant and graft: Secondary | ICD-10-CM | POA: Diagnosis not present

## 2020-12-09 DIAGNOSIS — I214 Non-ST elevation (NSTEMI) myocardial infarction: Secondary | ICD-10-CM | POA: Diagnosis not present

## 2020-12-09 DIAGNOSIS — E119 Type 2 diabetes mellitus without complications: Secondary | ICD-10-CM | POA: Diagnosis not present

## 2020-12-09 DIAGNOSIS — I10 Essential (primary) hypertension: Secondary | ICD-10-CM | POA: Diagnosis not present

## 2020-12-09 DIAGNOSIS — I517 Cardiomegaly: Secondary | ICD-10-CM | POA: Diagnosis not present

## 2020-12-09 DIAGNOSIS — R9431 Abnormal electrocardiogram [ECG] [EKG]: Secondary | ICD-10-CM | POA: Diagnosis not present

## 2020-12-10 DIAGNOSIS — I10 Essential (primary) hypertension: Secondary | ICD-10-CM | POA: Diagnosis not present

## 2020-12-10 DIAGNOSIS — Z794 Long term (current) use of insulin: Secondary | ICD-10-CM | POA: Diagnosis not present

## 2020-12-10 DIAGNOSIS — I214 Non-ST elevation (NSTEMI) myocardial infarction: Secondary | ICD-10-CM | POA: Diagnosis not present

## 2020-12-10 DIAGNOSIS — E119 Type 2 diabetes mellitus without complications: Secondary | ICD-10-CM | POA: Diagnosis not present

## 2020-12-15 DIAGNOSIS — I25119 Atherosclerotic heart disease of native coronary artery with unspecified angina pectoris: Secondary | ICD-10-CM | POA: Diagnosis not present

## 2020-12-15 DIAGNOSIS — I10 Essential (primary) hypertension: Secondary | ICD-10-CM | POA: Diagnosis not present

## 2020-12-15 DIAGNOSIS — J449 Chronic obstructive pulmonary disease, unspecified: Secondary | ICD-10-CM | POA: Diagnosis not present

## 2020-12-15 DIAGNOSIS — Z87891 Personal history of nicotine dependence: Secondary | ICD-10-CM | POA: Diagnosis not present

## 2020-12-15 DIAGNOSIS — E1165 Type 2 diabetes mellitus with hyperglycemia: Secondary | ICD-10-CM | POA: Diagnosis not present

## 2020-12-15 DIAGNOSIS — Z09 Encounter for follow-up examination after completed treatment for conditions other than malignant neoplasm: Secondary | ICD-10-CM | POA: Diagnosis not present

## 2020-12-26 DIAGNOSIS — G8929 Other chronic pain: Secondary | ICD-10-CM | POA: Diagnosis not present

## 2020-12-26 DIAGNOSIS — Z6821 Body mass index (BMI) 21.0-21.9, adult: Secondary | ICD-10-CM | POA: Diagnosis not present

## 2020-12-26 DIAGNOSIS — E1165 Type 2 diabetes mellitus with hyperglycemia: Secondary | ICD-10-CM | POA: Diagnosis not present

## 2020-12-26 DIAGNOSIS — I10 Essential (primary) hypertension: Secondary | ICD-10-CM | POA: Diagnosis not present

## 2020-12-26 DIAGNOSIS — M79606 Pain in leg, unspecified: Secondary | ICD-10-CM | POA: Diagnosis not present

## 2020-12-26 DIAGNOSIS — R197 Diarrhea, unspecified: Secondary | ICD-10-CM | POA: Diagnosis not present

## 2020-12-29 ENCOUNTER — Encounter (HOSPITAL_COMMUNITY): Payer: Medicare Other

## 2020-12-30 DIAGNOSIS — R197 Diarrhea, unspecified: Secondary | ICD-10-CM | POA: Diagnosis not present

## 2021-01-02 DIAGNOSIS — R197 Diarrhea, unspecified: Secondary | ICD-10-CM | POA: Diagnosis not present

## 2021-01-02 DIAGNOSIS — E1169 Type 2 diabetes mellitus with other specified complication: Secondary | ICD-10-CM | POA: Diagnosis not present

## 2021-01-03 DIAGNOSIS — Z6821 Body mass index (BMI) 21.0-21.9, adult: Secondary | ICD-10-CM | POA: Diagnosis not present

## 2021-01-03 DIAGNOSIS — R197 Diarrhea, unspecified: Secondary | ICD-10-CM | POA: Diagnosis not present

## 2021-01-03 DIAGNOSIS — I10 Essential (primary) hypertension: Secondary | ICD-10-CM | POA: Diagnosis not present

## 2021-01-03 DIAGNOSIS — E1165 Type 2 diabetes mellitus with hyperglycemia: Secondary | ICD-10-CM | POA: Diagnosis not present

## 2021-01-09 ENCOUNTER — Encounter: Payer: Self-pay | Admitting: Internal Medicine

## 2021-01-10 DIAGNOSIS — I214 Non-ST elevation (NSTEMI) myocardial infarction: Secondary | ICD-10-CM | POA: Diagnosis not present

## 2021-01-10 DIAGNOSIS — E785 Hyperlipidemia, unspecified: Secondary | ICD-10-CM | POA: Diagnosis not present

## 2021-01-10 DIAGNOSIS — I25118 Atherosclerotic heart disease of native coronary artery with other forms of angina pectoris: Secondary | ICD-10-CM | POA: Diagnosis not present

## 2021-01-10 DIAGNOSIS — I10 Essential (primary) hypertension: Secondary | ICD-10-CM | POA: Diagnosis not present

## 2021-01-13 ENCOUNTER — Encounter (HOSPITAL_COMMUNITY): Payer: Medicare Other

## 2021-01-18 DIAGNOSIS — I10 Essential (primary) hypertension: Secondary | ICD-10-CM | POA: Diagnosis not present

## 2021-01-18 DIAGNOSIS — E1165 Type 2 diabetes mellitus with hyperglycemia: Secondary | ICD-10-CM | POA: Diagnosis not present

## 2021-01-18 DIAGNOSIS — Z6821 Body mass index (BMI) 21.0-21.9, adult: Secondary | ICD-10-CM | POA: Diagnosis not present

## 2021-01-18 DIAGNOSIS — R197 Diarrhea, unspecified: Secondary | ICD-10-CM | POA: Diagnosis not present

## 2021-01-18 DIAGNOSIS — Z23 Encounter for immunization: Secondary | ICD-10-CM | POA: Diagnosis not present

## 2021-01-24 ENCOUNTER — Telehealth: Payer: Self-pay | Admitting: "Endocrinology

## 2021-01-24 NOTE — Telephone Encounter (Signed)
Schedule  and we will do a1c here .

## 2021-01-24 NOTE — Telephone Encounter (Signed)
Pt was referred here for diabetes, [facility] did not send any labs except for CMP. I contacted [facility] and the lady I spoke with said she does not have anymore recent labs done. Do you want me to sch the pt an appt here or does that office need to do more labs first.

## 2021-01-24 NOTE — Telephone Encounter (Signed)
Tried reaching pt. Unable to reach or leave message.

## 2021-02-01 ENCOUNTER — Ambulatory Visit: Payer: Medicare Other | Admitting: Internal Medicine

## 2021-02-01 ENCOUNTER — Other Ambulatory Visit: Payer: Self-pay

## 2021-02-01 ENCOUNTER — Encounter: Payer: Self-pay | Admitting: Internal Medicine

## 2021-02-01 VITALS — BP 114/64 | HR 76 | Temp 97.3°F | Ht 67.0 in | Wt 135.4 lb

## 2021-02-01 DIAGNOSIS — R197 Diarrhea, unspecified: Secondary | ICD-10-CM

## 2021-02-01 DIAGNOSIS — Z86010 Personal history of colonic polyps: Secondary | ICD-10-CM | POA: Diagnosis not present

## 2021-02-01 DIAGNOSIS — K219 Gastro-esophageal reflux disease without esophagitis: Secondary | ICD-10-CM | POA: Diagnosis not present

## 2021-02-01 NOTE — Patient Instructions (Signed)
I am happy to hear your diarrhea is improved.  We will continue to monitor this.  Given your recent heart attack I would delay colonoscopy for 1 year if we can.  Follow-up with GI in 6 months or sooner if needed.  At [facility] we value your feedback. You may receive a survey about your visit today. Please share your experience as we strive to create trusting relationships with our patients to provide genuine, compassionate, quality care.  We appreciate your understanding and patience as we review any laboratory studies, imaging, and other diagnostic tests that are ordered as we care for you. Our office policy is 5 business days for review of these results, and any emergent or urgent results are addressed in a timely manner for your best interest. If you do not hear from our office in 1 week, please contact us.   We also encourage the use of MyChart, which contains your medical information for your review as well. If you are not enrolled in this feature, an access code is on this after visit summary for your convenience. Thank you for allowing [place] to be involved in your care.  It was great to see you today!  I hope you have a great rest of your spring!!    Hennie Duos. Marletta Lor, D.O. Gastroenterology and Hepatology [facility]

## 2021-02-06 DIAGNOSIS — F32A Depression, unspecified: Secondary | ICD-10-CM | POA: Diagnosis not present

## 2021-02-06 DIAGNOSIS — E1165 Type 2 diabetes mellitus with hyperglycemia: Secondary | ICD-10-CM | POA: Diagnosis not present

## 2021-02-06 DIAGNOSIS — E7800 Pure hypercholesterolemia, unspecified: Secondary | ICD-10-CM | POA: Diagnosis not present

## 2021-02-06 DIAGNOSIS — I10 Essential (primary) hypertension: Secondary | ICD-10-CM | POA: Diagnosis not present

## 2021-02-06 DIAGNOSIS — Z6821 Body mass index (BMI) 21.0-21.9, adult: Secondary | ICD-10-CM | POA: Diagnosis not present

## 2021-02-06 DIAGNOSIS — R197 Diarrhea, unspecified: Secondary | ICD-10-CM | POA: Diagnosis not present

## 2021-02-06 DIAGNOSIS — Z1389 Encounter for screening for other disorder: Secondary | ICD-10-CM | POA: Diagnosis not present

## 2021-02-07 ENCOUNTER — Other Ambulatory Visit: Payer: Self-pay

## 2021-02-07 ENCOUNTER — Encounter: Payer: Self-pay | Admitting: Nurse Practitioner

## 2021-02-07 ENCOUNTER — Ambulatory Visit: Payer: Medicare Other | Admitting: Nurse Practitioner

## 2021-02-07 VITALS — BP 136/75 | HR 67 | Ht 67.0 in | Wt 136.0 lb

## 2021-02-07 DIAGNOSIS — IMO0002 Reserved for concepts with insufficient information to code with codable children: Secondary | ICD-10-CM

## 2021-02-07 DIAGNOSIS — E1165 Type 2 diabetes mellitus with hyperglycemia: Secondary | ICD-10-CM | POA: Diagnosis not present

## 2021-02-07 DIAGNOSIS — E1159 Type 2 diabetes mellitus with other circulatory complications: Secondary | ICD-10-CM

## 2021-02-07 DIAGNOSIS — Z794 Long term (current) use of insulin: Secondary | ICD-10-CM

## 2021-02-07 NOTE — Patient Instructions (Signed)
Diabetes Mellitus and Nutrition, Adult When you have diabetes, or diabetes mellitus, it is very important to have healthy eating habits because your blood sugar (glucose) levels are greatly affected by what you eat and drink. Eating healthy foods in the right amounts, at about the same times every day, can help you:  Control your blood glucose.  Lower your risk of heart disease.  Improve your blood pressure.  Reach or maintain a healthy weight. What can affect my meal plan? Every person with diabetes is different, and each person has different needs for a meal plan. Your health care provider may recommend that you work with a dietitian to make a meal plan that is best for you. Your meal plan may vary depending on factors such as:  The calories you need.  The medicines you take.  Your weight.  Your blood glucose, blood pressure, and cholesterol levels.  Your activity level.  Other health conditions you have, such as heart or kidney disease. How do carbohydrates affect me? Carbohydrates, also called carbs, affect your blood glucose level more than any other type of food. Eating carbs naturally raises the amount of glucose in your blood. Carb counting is a method for keeping track of how many carbs you eat. Counting carbs is important to keep your blood glucose at a healthy level, especially if you use insulin or take certain oral diabetes medicines. It is important to know how many carbs you can safely have in each meal. This is different for every person. Your dietitian can help you calculate how many carbs you should have at each meal and for each snack. How does alcohol affect me? Alcohol can cause a sudden decrease in blood glucose (hypoglycemia), especially if you use insulin or take certain oral diabetes medicines. Hypoglycemia can be a life-threatening condition. Symptoms of hypoglycemia, such as sleepiness, dizziness, and confusion, are similar to symptoms of having too much  alcohol.  Do not drink alcohol if: ? Your health care provider tells you not to drink. ? You are pregnant, may be pregnant, or are planning to become pregnant.  If you drink alcohol: ? Do not drink on an empty stomach. ? Limit how much you use to:  0-1 drink a day for women.  0-2 drinks a day for men. ? Be aware of how much alcohol is in your drink. In the [place] one drink equals one 12 oz bottle of beer (355 mL), one 5 oz glass of wine (148 mL), or one 1 oz glass of hard liquor (44 mL). ? Keep yourself hydrated with water, diet soda, or unsweetened iced tea.  Keep in mind that regular soda, juice, and other mixers may contain a lot of sugar and must be counted as carbs. What are tips for following this plan? Reading food labels  Start by checking the serving size on the "Nutrition Facts" label of packaged foods and drinks. The amount of calories, carbs, fats, and other nutrients listed on the label is based on one serving of the item. Many items contain more than one serving per package.  Check the total grams (g) of carbs in one serving. You can calculate the number of servings of carbs in one serving by dividing the total carbs by 15. For example, if a food has 30 g of total carbs per serving, it would be equal to 2 servings of carbs.  Check the number of grams (g) of saturated fats and trans fats in one serving. Choose foods that have   a low amount or none of these fats.  Check the number of milligrams (mg) of salt (sodium) in one serving. Most people should limit total sodium intake to less than 2,300 mg per day.  Always check the nutrition information of foods labeled as "low-fat" or "nonfat." These foods may be higher in added sugar or refined carbs and should be avoided.  Talk to your dietitian to identify your daily goals for nutrients listed on the label. Shopping  Avoid buying canned, pre-made, or processed foods. These foods tend to be high in fat, sodium, and added  sugar.  Shop around the outside edge of the grocery store. This is where you will most often find fresh fruits and vegetables, bulk grains, fresh meats, and fresh dairy. Cooking  Use low-heat cooking methods, such as baking, instead of high-heat cooking methods like deep frying.  Cook using healthy oils, such as olive, canola, or sunflower oil.  Avoid cooking with butter, cream, or high-fat meats. Meal planning  Eat meals and snacks regularly, preferably at the same times every day. Avoid going long periods of time without eating.  Eat foods that are high in fiber, such as fresh fruits, vegetables, beans, and whole grains. Talk with your dietitian about how many servings of carbs you can eat at each meal.  Eat 4-6 oz (112-168 g) of lean protein each day, such as lean meat, chicken, fish, eggs, or tofu. One ounce (oz) of lean protein is equal to: ? 1 oz (28 g) of meat, chicken, or fish. ? 1 egg. ?  cup (62 g) of tofu.  Eat some foods each day that contain healthy fats, such as avocado, nuts, seeds, and fish.   What foods should I eat? Fruits Berries. Apples. Oranges. Peaches. Apricots. Plums. Grapes. Mango. Papaya. Pomegranate. Kiwi. Cherries. Vegetables Lettuce. Spinach. Leafy greens, including kale, chard, collard greens, and mustard greens. Beets. Cauliflower. Cabbage. Broccoli. Carrots. Green beans. Tomatoes. Peppers. Onions. Cucumbers. Brussels sprouts. Grains Whole grains, such as whole-wheat or whole-grain bread, crackers, tortillas, cereal, and pasta. Unsweetened oatmeal. Quinoa. Brown or wild rice. Meats and other proteins Seafood. Poultry without skin. Lean cuts of poultry and beef. Tofu. Nuts. Seeds. Dairy Low-fat or fat-free dairy products such as milk, yogurt, and cheese. The items listed above may not be a complete list of foods and beverages you can eat. Contact a dietitian for more information. What foods should I avoid? Fruits Fruits canned with  syrup. Vegetables Canned vegetables. Frozen vegetables with butter or cream sauce. Grains Refined white flour and flour products such as bread, pasta, snack foods, and cereals. Avoid all processed foods. Meats and other proteins Fatty cuts of meat. Poultry with skin. Breaded or fried meats. Processed meat. Avoid saturated fats. Dairy Full-fat yogurt, cheese, or milk. Beverages Sweetened drinks, such as soda or iced tea. The items listed above may not be a complete list of foods and beverages you should avoid. Contact a dietitian for more information. Questions to ask a health care provider  Do I need to meet with a diabetes educator?  Do I need to meet with a dietitian?  What number can I call if I have questions?  When are the best times to check my blood glucose? Where to find more information:  American Diabetes Association: diabetes.org  Academy of Nutrition and Dietetics: www.eatright.org  National Institute of Diabetes and Digestive and Kidney Diseases: www.niddk.nih.gov  Association of Diabetes Care and Education Specialists: www.diabeteseducator.org Summary  It is important to have healthy eating   habits because your blood sugar (glucose) levels are greatly affected by what you eat and drink.  A healthy meal plan will help you control your blood glucose and maintain a healthy lifestyle.  Your health care provider may recommend that you work with a dietitian to make a meal plan that is best for you.  Keep in mind that carbohydrates (carbs) and alcohol have immediate effects on your blood glucose levels. It is important to count carbs and to use alcohol carefully. This information is not intended to replace advice given to you by your health care provider. Make sure you discuss any questions you have with your health care provider. Document Revised: 10/27/2019 Document Reviewed: 10/27/2019 Elsevier Patient Education  2021 [employer]  

## 2021-02-07 NOTE — Progress Notes (Signed)
Endocrinology Consult Note       02/07/2021, 1:04 PM   Subjective:    Patient ID: Gabrielle Wood, female    DOB: December 07, 1945.  Gabrielle Wood is being seen in consultation for management of currently uncontrolled symptomatic diabetes requested by  [place] Nation, MD.   Past Medical History:  Diagnosis Date  . Arthritis   . COPD (chronic obstructive pulmonary disease) ([place])   . Diabetes mellitus   . DVT (deep venous thrombosis) ([place])    years ago  . GERD (gastroesophageal reflux disease)   . High cholesterol   . Hypertension   . Hypothyroidism   . Thyroid disease     Past Surgical History:  Procedure Laterality Date  . BACK SURGERY     lumbar x3   . ESOPHAGOGASTRODUODENOSCOPY  04/04/2012   Moderate gastritis/MODERATE Duodenitis in the duodenal bulb duodenum  . EUS  05/14/2012   Normal pancreas, gallbladder,biliary tree/ Incidental finding of simple-appearing right renal cyst.  . TONSILLECTOMY    . TUBAL LIGATION    . VEIN SURGERY     stripping years ago but recent laser surgery; bilateral legs    Social History   Socioeconomic History  . Marital status: Widowed    Spouse name: Not on file  . Number of children: 2  . Years of education: Not on file  . Highest education level: Not on file  Occupational History  . Occupation: retired, Secretary/administrator at [employer]  Tobacco Use  . Smoking status: Former Smoker    Packs/day: 0.50  . Smokeless tobacco: Never Used  Vaping Use  . Vaping Use: Never used  Substance and Sexual Activity  . Alcohol use: No  . Drug use: No  . Sexual activity: Not on file  Other Topics Concern  . Not on file  Social History Narrative  . Not on file   Social Determinants of Health   Financial Resource Strain: Not on file  Food Insecurity: Not on file  Transportation Needs: Not on file  Physical Activity: Not on file  Stress: Not on file  Social Connections: Not on  file    Family History  Problem Relation Age of Onset  . Pancreatic cancer Mother        diagnosed at age 18s, died from heart attack  . Inflammatory bowel disease Brother        crohn's, his dgt deceased from colon cancer at age 64  . Colon cancer Neg Hx     Outpatient Encounter Medications as of 02/07/2021  Medication Sig  . ALPRAZolam (XANAX) 0.5 MG tablet Take 0.5 mg by mouth 2 (two) times daily.  [name] amLODipine (NORVASC) 10 MG tablet Take 5 mg by mouth daily.  [name] aspirin EC 81 MG tablet Take 81 mg by mouth daily. Swallow whole.  [name] atorvastatin (LIPITOR) 40 MG tablet   . baclofen (LIORESAL) 10 MG tablet Take 1 tablet by mouth 2 (two) times daily as needed.  . Calcium Carbonate (CALCIUM 600 PO) Take by mouth daily.   [name] EASY COMFORT PEN NEEDLES 31G X 8 MM MISC AS DIRECTED FOUR TIMES DAILY  . fexofenadine (ALLEGRA) 60 MG tablet Take by mouth.  [name]  gabapentin (NEURONTIN) 100 MG capsule 2 capsules twice a day, 3 capsules at night  . HUMALOG KWIKPEN 200 UNIT/ML SOPN Inject 2-5 Units into the skin 3 (three) times daily. If glucose is above 90 and she is eating  . IBU 800 MG tablet Take 800 mg by mouth 3 (three) times daily as needed.  . Lancets (ONETOUCH DELICA PLUS [id number]) MISC Apply topically 4 (four) times daily.  [name] levothyroxine (SYNTHROID, LEVOTHROID) 137 MCG tablet Take 137 mcg by mouth daily before breakfast.  . metFORMIN (GLUCOPHAGE) 500 MG tablet TAKE 1 TABLET BY MOUTH TWICE DAILY (Patient taking differently: Take 500 mg by mouth 2 (two) times daily with a meal. 2 tabs bid)  . nitroGLYCERIN (NITROSTAT) 0.4 MG SL tablet Place 0.4 mg under the tongue every 5 (five) minutes as needed for chest pain.  Glory Rosebush ULTRA test strip 4 (four) times daily.  . ticagrelor (BRILINTA) 90 MG TABS tablet Take 90 mg by mouth 2 (two) times daily.  . TRESIBA FLEXTOUCH 200 UNIT/ML SOPN Inject 35 Units into the skin at bedtime. Taking in am  . valsartan (DIOVAN) 80 MG tablet Take 80 mg by mouth daily.   . [DISCONTINUED] Na Sulfate-K Sulfate-Mg Sulf 17.5-3.13-1.6 GM/177ML SOLN Take 1 kit by mouth as directed.   No facility-administered encounter medications on file as of 02/07/2021.    ALLERGIES: Allergies  Allergen Reactions  . Ciprocin-Fluocin-Procin [Fluocinolone]     Nightmares  . Lipitor [Atorvastatin] Other (See Comments)    Muscle aches, pain  . Lisinopril Cough  . Red Yeast Rice [Cholestin] Itching and Rash    VACCINATION STATUS: Immunization History  Administered Date(s) Administered  . Pneumococcal Polysaccharide-23 04/04/2012    Diabetes She presents for her initial diabetic visit. She has type 2 diabetes mellitus. Onset time: Was diagnosed at approx age of 52. Her disease course has been fluctuating. Hypoglycemia symptoms include sweats and tremors. Associated symptoms include fatigue, polydipsia and polyuria. There are no hypoglycemic complications. Symptoms are stable. Diabetic complications include heart disease. (Had a MI in the past) Risk factors for coronary artery disease include diabetes mellitus, dyslipidemia, hypertension, post-menopausal and sedentary lifestyle. Current diabetic treatment includes intensive insulin program and oral agent (monotherapy). She is compliant with treatment most of the time. Her weight is fluctuating minimally. She is following a generally unhealthy diet. When asked about meal planning, she reported none. She has not had a previous visit with a dietitian. She participates in exercise intermittently. (She presents today for her consultation, with no meter or logs to review.  She does routinely monitor glucose between 5-6 times per day.  Her most recent A1c was 9% on 12/09/2020.  She admits to drinking diet soda along with her water throughout the day, eats routine meals 3 x daily and snacks if glucose is low.  She does make a point to walk (inside her house) several times per day for exercise.  She denies any significant hypoglycemia.) An ACE  inhibitor/angiotensin II receptor blocker is being taken. She does not see a podiatrist.Eye exam is current.  Hyperlipidemia This is a chronic problem. The current episode started more than 1 year ago. The problem is uncontrolled. Recent lipid tests were reviewed and are high. Exacerbating diseases include chronic renal disease, diabetes and hypothyroidism. There are no known factors aggravating her hyperlipidemia. Current antihyperlipidemic treatment includes statins. The current treatment provides mild improvement of lipids. Compliance problems include adherence to diet and adherence to exercise.  Risk factors for coronary artery disease include diabetes  mellitus, dyslipidemia, hypertension, post-menopausal and a sedentary lifestyle.  Hypertension This is a chronic problem. The current episode started more than 1 year ago. The problem has been resolved since onset. The problem is controlled. Associated symptoms include sweats. Agents associated with hypertension include thyroid hormones. Risk factors for coronary artery disease include diabetes mellitus, dyslipidemia, post-menopausal state and sedentary lifestyle. Past treatments include angiotensin blockers and calcium channel blockers. There are no compliance problems.  Hypertensive end-organ damage includes kidney disease and CAD/MI. Identifiable causes of hypertension include chronic renal disease and a thyroid problem.     Review of systems  Constitutional: + Minimally fluctuating body weight,  current Body mass index is 21.3 kg/m. , + fatigue, no subjective hyperthermia, no subjective hypothermia Eyes: no blurry vision, no xerophthalmia ENT: no sore throat, no nodules palpated in throat, no dysphagia/odynophagia, no hoarseness Cardiovascular: no chest pain, no shortness of breath, no palpitations, no leg swelling Respiratory: no cough, no shortness of breath Gastrointestinal: no nausea/vomiting/diarrhea Genitourinary:  Polyuria Musculoskeletal: no muscle/joint aches Skin: no rashes, no hyperemia Neurological: no tremors, no numbness, no tingling, no dizziness Psychiatric: no depression, no anxiety  Objective:     BP 136/75 (BP Location: Right Arm, Patient Position: Sitting)   Pulse 67   Ht 5' 7"  (1.702 m)   Wt 136 lb (61.7 kg)   BMI 21.30 kg/m   Wt Readings from Last 3 Encounters:  02/07/21 136 lb (61.7 kg)  02/01/21 135 lb 6.4 oz (61.4 kg)  08/21/19 151 lb (68.5 kg)     BP Readings from Last 3 Encounters:  02/07/21 136/75  02/01/21 114/64  08/21/19 (!) 142/68     Physical Exam- Limited  Constitutional:  Body mass index is 21.3 kg/m. , not in acute distress, normal state of mind Eyes:  EOMI, no exophthalmos Neck: Supple Thyroid: No gross goiter Cardiovascular: RRR, no murmers, rubs, or gallops, no edema Respiratory: Adequate breathing efforts, no crackles, rales, rhonchi, or wheezing Musculoskeletal: no gross deformities, strength intact in all four extremities, no gross restriction of joint movements Skin:  no rashes, no hyperemia Neurological: no tremor with outstretched hands    CMP ( most recent) CMP     Component Value Date/Time   NA 134 (L) 08/21/2019 1539   NA 139 07/17/2017 0912   K 3.6 08/21/2019 1539   CL 99 08/21/2019 1539   CO2 25 08/21/2019 1539   GLUCOSE 263 (H) 08/21/2019 1539   BUN 22 08/21/2019 1539   BUN 15 07/17/2017 0912   CREATININE 0.88 08/21/2019 1539   CREATININE 0.78 09/20/2016 0923   CALCIUM 9.5 08/21/2019 1539   PROT 6.7 07/17/2017 0912   ALBUMIN 4.4 07/17/2017 0912   AST 13 07/17/2017 0912   ALT 8 07/17/2017 0912   ALKPHOS 56 07/17/2017 0912   BILITOT 0.5 07/17/2017 0912   GFRNONAA >60 08/21/2019 1539   GFRNONAA 77 09/20/2016 0923   GFRAA >60 08/21/2019 1539   GFRAA 88 09/20/2016 0923     Diabetic Labs (most recent): Lab Results  Component Value Date   HGBA1C 9.6 (H) 04/11/2017   HGBA1C 8.5 (H) 01/04/2017   HGBA1C 7.7 (H)  09/20/2016     Lipid Panel ( most recent) Lipid Panel  No results found for: CHOL, TRIG, HDL, CHOLHDL, VLDL, LDLCALC, LDLDIRECT, LABVLDL    Lab Results  Component Value Date   TSH 19.240 (H) 07/17/2017   TSH 1.91 09/20/2016   TSH 3.05 06/14/2016   TSH 2.86 03/12/2016   TSH 4.280 04/04/2012  FREET4 1.53 07/17/2017   FREET4 1.5 09/20/2016   FREET4 1.6 06/14/2016   FREET4 1.5 03/12/2016           Assessment & Plan:   1) Uncontrolled type 2 diabetes mellitus with circulatory disorder, with long-term current use of insulin ([place])  - Gabrielle Wood has currently uncontrolled symptomatic type 2 DM since 75 years of age, with most recent A1c of 9 %.   -Recent labs reviewed.  - I had a long discussion with her about the progressive nature of diabetes and the pathology behind its complications. 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 These are all discussed in detail with her.  - I have counseled her on diet  and weight management by adopting a carbohydrate restricted/protein rich diet. Patient is encouraged to switch to unprocessed or minimally processed complex starch and increased protein intake (animal or plant source), fruits, and vegetables. -  she is advised to stick to a routine mealtimes to eat 3 meals a day and avoid unnecessary snacks (to snack only to correct hypoglycemia).   - she acknowledges that there is a room for improvement in her food and drink choices. - Suggestion is made for her to avoid simple carbohydrates  from her diet including Cakes, Sweet Desserts, Ice Cream, Soda (diet and regular), Sweet Tea, Candies, Chips, Cookies, Store Bought Juices, Alcohol in Excess of  1-2 drinks a day, Artificial Sweeteners, Coffee Creamer, and "Sugar-free" Products. This will help patient to have more stable blood glucose  profile and potentially avoid unintended weight gain.  - she will be scheduled with Jearld Fenton, RDN, CDE for diabetes education.  - I have approached her with the following individualized plan to manage  her diabetes and patient agrees:   -She is advised to continue current dose of Tresiba at 35 units SQ nightly (instead of in am).  I advised her to change the way she takes her Humalog to 2-5 units TID with meals if glucose is above 90 and she is eating.  Specific instructions on how to titrate insulin dose based on glucose readings given to patient in writing.  She demonstrated her ability to use the SSI chart properly today.  -she is encouraged to start monitoring glucose 4 times daily, before meals and before bed, to log their readings on the clinic sheets provided, and bring them to review at follow up appointment in 2 weeks.  - she is warned not to take insulin without proper monitoring per orders. - Adjustment parameters are given to her for hypo and hyperglycemia in writing. - she is encouraged to call clinic for blood glucose levels less than 70 or above 300 mg /dl. - she is advised to continue Metformin 1000 mg po twice daily with meals, therapeutically suitable for patient .  -She is not a candidate for incretin therapy due to body habitus.  - Specific targets for  A1c;  LDL, HDL,  and Triglycerides were discussed with the patient.  2) Blood Pressure /Hypertension:  her blood pressure is controlled to target.   she is advised to continue her current medications including Norvasc 10 mg po daily, and Valsartan 80 mg p.o. daily with breakfast.  3) Lipids/Hyperlipidemia:    Review of her recent lipid panel from 12/09/2020 showed uncontrolled LDL at 103 .  she is advised to continue Lipitor 40  mg daily at bedtime (recently increased by her PCP).  Side effects and precautions discussed with her.  4)  Weight/Diet:  her Body mass index is 21.3 kg/m.  -  she is not a candidate for  weight loss.  Exercise, and detailed carbohydrates information provided  -  detailed on discharge instructions.  5) Chronic Care/Health Maintenance: -she is on ACEI/ARB and Statin medications and is encouraged to initiate and continue to follow up with Ophthalmology, Dentist, Podiatrist at least yearly or according to recommendations, and advised to stay away from smoking. I have recommended yearly flu vaccine and pneumonia vaccine at least every 5 years; moderate intensity exercise for up to 150 minutes weekly; and sleep for at least 7 hours a day.  - she is advised to maintain close follow up with [place] Nation, MD for primary care needs, as well as her other providers for optimal and coordinated care.   - Time spent in this patient care: 60 min, of which > 50% was spent in counseling her about her diabetes and the rest reviewing her blood glucose logs, discussing her hypoglycemia and hyperglycemia episodes, reviewing her current and previous labs/studies (including abstraction from other facilities) and medications doses and developing a long term treatment plan based on the latest standards of care/guidelines; and documenting her care.    Please refer to Patient Instructions for Blood Glucose Monitoring and Insulin/Medications Dosing Guide" in media tab for additional information. Please also refer to "Patient Self Inventory" in the Media tab for reviewed elements of pertinent patient history.  Gabrielle Wood participated in the discussions, expressed understanding, and voiced agreement with the above plans.  All questions were answered to her satisfaction. she is encouraged to contact clinic should she have any questions or concerns prior to her return visit.   Follow up plan: - Return in about 2 weeks (around 02/21/2021) for Diabetes follow up, Bring glucometer and logs, ABI next visit.  Rayetta Pigg, [facility] [address] [place], [place]  [address] Phone: [phone] Fax: [phone]  02/07/2021, 1:04 PM

## 2021-02-08 ENCOUNTER — Telehealth: Payer: Self-pay | Admitting: Nurse Practitioner

## 2021-02-08 NOTE — Telephone Encounter (Signed)
I called patient back, she stated that she has not had anything to eat since 9 am when she had breakfast and she took her insulin as she was advised to this am, I advised her to take insulin as per sliding scale now and to eat and that I would let you know her readings, please advise if any changes.

## 2021-02-08 NOTE — Telephone Encounter (Signed)
Returned call to patient and advised, verbalized understanding 

## 2021-02-08 NOTE — Telephone Encounter (Signed)
Pt is calling in regards to her sugar.    523 at 12p today [phone] today 401 now

## 2021-02-08 NOTE — Telephone Encounter (Signed)
No changes.  We expect to see high glucose readings today due to her missing her Gabrielle Wood this morning so she can start taking it at bedtime tonight.  She needs to continue with current plan for now.

## 2021-02-09 ENCOUNTER — Telehealth: Payer: Self-pay

## 2021-02-09 ENCOUNTER — Encounter (HOSPITAL_COMMUNITY)
Admission: RE | Admit: 2021-02-09 | Discharge: 2021-02-09 | Disposition: A | Discharge status: Home / Self Care | Payer: Medicare Other | Source: Ambulatory Visit | Attending: Internal Medicine | Admitting: Internal Medicine

## 2021-02-09 ENCOUNTER — Other Ambulatory Visit: Payer: Self-pay

## 2021-02-09 VITALS — BP 124/80 | HR 77 | Ht 65.0 in | Wt 133.4 lb

## 2021-02-09 DIAGNOSIS — I214 Non-ST elevation (NSTEMI) myocardial infarction: Secondary | ICD-10-CM | POA: Insufficient documentation

## 2021-02-09 DIAGNOSIS — Z955 Presence of coronary angioplasty implant and graft: Secondary | ICD-10-CM | POA: Insufficient documentation

## 2021-02-09 NOTE — Telephone Encounter (Signed)
Called patient and advised of changes, verbalized understanding. °

## 2021-02-09 NOTE — Telephone Encounter (Signed)
Error-documented in other note

## 2021-02-09 NOTE — Telephone Encounter (Signed)
Gabrielle Wood from [facility] said that she is there now and her sugar about 30 mins ago was 376 and it has to be below 300 to do the walk test or even exercise when she goes there for therapy. She just checked it again at 1:50 and its 420. Patient lives in [place] and they are going to release her to go home. Please advise with patient. Call patient at [phone], give her about 45 mins.

## 2021-02-09 NOTE — Telephone Encounter (Signed)
Lets increase her Humalog to 5-8 units TID with meals if glucose is above 90 and she is eating.  Follow the following SSI: 151-250= 6 units 251-350= 7 units Above 351= 8 units

## 2021-02-09 NOTE — Telephone Encounter (Signed)
Attempted to contact patient to advise, no answer on phone no vm set up.

## 2021-02-10 ENCOUNTER — Encounter (HOSPITAL_COMMUNITY)
Admission: RE | Admit: 2021-02-10 | Discharge: 2021-02-10 | Disposition: A | Discharge status: Home / Self Care | Payer: Medicare Other | Source: Ambulatory Visit | Attending: Internal Medicine | Admitting: Internal Medicine

## 2021-02-10 ENCOUNTER — Encounter (HOSPITAL_COMMUNITY): Payer: Self-pay

## 2021-02-10 VITALS — Wt 133.4 lb

## 2021-02-10 DIAGNOSIS — Z955 Presence of coronary angioplasty implant and graft: Secondary | ICD-10-CM

## 2021-02-10 DIAGNOSIS — I214 Non-ST elevation (NSTEMI) myocardial infarction: Secondary | ICD-10-CM | POA: Diagnosis not present

## 2021-02-10 NOTE — Progress Notes (Signed)
Cardiac Individual Treatment Plan  Patient Details  Name: Gabrielle Wood MRN: [id number] Date of Birth: 02-24-1946 Referring Provider:    Initial Encounter Date:   Visit Diagnosis: NSTEMI (non-ST elevated myocardial infarction) [facility](HCC)  S/P coronary artery stent placement  Patient's Home Medications on Admission:  Current Outpatient Medications:  .  ALPRAZolam (XANAX) 0.5 MG tablet, Take 0.5 mg by mouth 2 (two) times daily., Disp: , Rfl:  .  amLODipine (NORVASC) 10 MG tablet, Take 5 mg by mouth daily., Disp: , Rfl:  .  aspirin EC 81 MG tablet, Take 81 mg by mouth daily. Swallow whole., Disp: , Rfl:  .  atorvastatin (LIPITOR) 40 MG tablet, , Disp: , Rfl:  .  baclofen (LIORESAL) 10 MG tablet, Take 1 tablet by mouth 2 (two) times daily as needed., Disp: , Rfl: 2 .  Calcium Carbonate (CALCIUM 600 PO), Take by mouth daily. , Disp: , Rfl:  .  EASY COMFORT PEN NEEDLES 31G X 8 MM MISC, AS DIRECTED FOUR TIMES DAILY, Disp: , Rfl:  .  fexofenadine (ALLEGRA) 60 MG tablet, Take by mouth., Disp: , Rfl:  .  gabapentin (NEURONTIN) 100 MG capsule, 2 capsules twice a day, 3 capsules at night, Disp: , Rfl:  .  HUMALOG KWIKPEN 200 UNIT/ML SOPN, Inject 2-5 Units into the skin 3 (three) times daily. If glucose is above 90 and she is eating, Disp: , Rfl: 3 .  IBU 800 MG tablet, Take 800 mg by mouth 3 (three) times daily as needed., Disp: , Rfl:  .  Lancets (ONETOUCH DELICA PLUS LANCET33G) MISC, Apply topically 4 (four) times daily., Disp: , Rfl:  .  levothyroxine (SYNTHROID, LEVOTHROID) 137 MCG tablet, Take 137 mcg by mouth daily before breakfast., Disp: , Rfl:  .  metFORMIN (GLUCOPHAGE) 500 MG tablet, TAKE 1 TABLET BY MOUTH TWICE DAILY (Patient taking differently: Take 500 mg by mouth 2 (two) times daily with a meal. 2 tabs bid), Disp: 180 tablet, Rfl: 0 .  nitroGLYCERIN (NITROSTAT) 0.4 MG SL tablet, Place 0.4 mg under the tongue every 5 (five) minutes as needed for chest pain., Disp: , Rfl:  .  ONETOUCH  ULTRA test strip, 4 (four) times daily., Disp: , Rfl:  .  ticagrelor (BRILINTA) 90 MG TABS tablet, Take 90 mg by mouth 2 (two) times daily., Disp: , Rfl:  .  TRESIBA FLEXTOUCH 200 UNIT/ML SOPN, Inject 35 Units into the skin at bedtime. Taking in am, Disp: , Rfl: 3 .  valsartan (DIOVAN) 80 MG tablet, Take 80 mg by mouth daily., Disp: , Rfl:   Past Medical History: Past Medical History:  Diagnosis Date  . Arthritis   . COPD (chronic obstructive pulmonary disease) (HCC)   . Diabetes mellitus   . DVT (deep venous thrombosis) (HCC)    years ago  . GERD (gastroesophageal reflux disease)   . High cholesterol   . Hypertension   . Hypothyroidism   . Thyroid disease     Tobacco Use: Social History   Tobacco Use  Smoking Status Former Smoker  . Packs/day: 0.50  Smokeless Tobacco Never Used    Labs: Recent Review Flowsheet Data    Labs for ITP Cardiac and Pulmonary Rehab Latest Ref Rng & Units 03/12/2016 06/14/2016 09/20/2016 01/04/2017 04/11/2017   Hemoglobin A1c 4.8 - 5.6 % 7.6(H) 8.0(H) 7.7(H) 8.5(H) 9.6(H)      Capillary Blood Glucose: Lab Results  Component Value Date   GLUCAP 246 (H) 08/21/2019   GLUCAP 169 (H) 05/14/2012  GLUCAP 176 (H) 04/05/2012   GLUCAP 141 (H) 04/05/2012   GLUCAP 178 (H) 04/04/2012     Exercise Target Goals: Exercise Program Goal: Individual exercise prescription set using results from initial 6 min walk test and THRR while considering  patient's activity barriers and safety.   Exercise Prescription Goal: Starting with aerobic activity 30 plus minutes a day, 3 days per week for initial exercise prescription. Provide home exercise prescription and guidelines that participant acknowledges understanding prior to discharge.  Activity Barriers & Risk Stratification:  Activity Barriers & Cardiac Risk Stratification - 02/10/21 1207      Activity Barriers & Cardiac Risk Stratification   Activity Barriers Balance Concerns;Deconditioning;Muscular  Weakness;Joint Problems;Arthritis    Cardiac Risk Stratification High           6 Minute Walk:  6 Minute Walk    Row Name 02/10/21 1206         6 Minute Walk   Phase Initial     Distance 600 feet     Walk Time 6 minutes     # of Rest Breaks 0     MPH 1.14     METS 1.49     RPE 12     VO2 Peak 5.22     Symptoms No     Resting HR 77 bpm     Resting BP 124/80     Resting Oxygen Saturation  99 %     Exercise Oxygen Saturation  during 6 min walk 100 %     Max Ex. HR 80 bpm     Max Ex. BP 130/70     2 Minute Post BP 112/72            Oxygen Initial Assessment:   Oxygen Re-Evaluation:   Oxygen Discharge (Final Oxygen Re-Evaluation):   Initial Exercise Prescription:  Initial Exercise Prescription - 02/10/21 1200      Date of Initial Exercise RX and Referring Provider   Date 02/10/21    Referring Provider Dr. Sharrell Ku    Expected Discharge Date 05/05/21      NuStep   Level 1    SPM 60    Minutes 39      Prescription Details   Frequency (times per week) 3    Duration Progress to 30 minutes of continuous aerobic without signs/symptoms of physical distress      Intensity   THRR 40-80% of Max Heartrate 58-116    Ratings of Perceived Exertion 11-13    Perceived Dyspnea 0-4      Resistance Training   Training Prescription Yes    Weight 1 lb    Reps 10-15           Perform Capillary Blood Glucose checks as needed.  Exercise Prescription Changes:   Exercise Comments:   Exercise Goals and Review:   Exercise Goals    Row Name 02/10/21 1209             Exercise Goals   Increase Physical Activity Yes       Intervention Provide advice, education, support and counseling about physical activity/exercise needs.;Develop an individualized exercise prescription for aerobic and resistive training based on initial evaluation findings, risk stratification, comorbidities and participant's personal goals.       Expected Outcomes Short Term: Attend rehab on a  regular basis to increase amount of physical activity.;Long Term: Add in home exercise to make exercise part of routine and to increase amount of physical activity.;Long Term: Exercising regularly at least  3-5 days a week.       Increase Strength and Stamina Yes       Intervention Provide advice, education, support and counseling about physical activity/exercise needs.;Develop an individualized exercise prescription for aerobic and resistive training based on initial evaluation findings, risk stratification, comorbidities and participant's personal goals.       Expected Outcomes Short Term: Increase workloads from initial exercise prescription for resistance, speed, and METs.;Short Term: Perform resistance training exercises routinely during rehab and add in resistance training at home;Long Term: Improve cardiorespiratory fitness, muscular endurance and strength as measured by increased METs and functional capacity ( )       Able to understand and use rate of perceived exertion (RPE) scale Yes       Intervention Provide education and explanation on how to use RPE scale       Expected Outcomes Short Term: Able to use RPE daily in rehab to express subjective intensity level;Long Term:  Able to use RPE to guide intensity level when exercising independently       Knowledge and understanding of Target Heart Rate Range (THRR) Yes       Intervention Provide education and explanation of THRR including how the numbers were predicted and where they are located for reference       Expected Outcomes Short Term: Able to state/look up THRR;Long Term: Able to use THRR to govern intensity when exercising independently;Short Term: Able to use daily as guideline for intensity in rehab       Able to check pulse independently Yes       Intervention Provide education and demonstration on how to check pulse in carotid and radial arteries.;Review the importance of being able to check your own pulse for safety during  independent exercise       Expected Outcomes Short Term: Able to explain why pulse checking is important during independent exercise;Long Term: Able to check pulse independently and accurately       Understanding of Exercise Prescription Yes       Intervention Provide education, explanation, and written materials on patient's individual exercise prescription       Expected Outcomes Short Term: Able to explain program exercise prescription;Long Term: Able to explain home exercise prescription to exercise independently              Exercise Goals Re-Evaluation :    Discharge Exercise Prescription (Final Exercise Prescription Changes):   Nutrition:  Target Goals: Understanding of nutrition guidelines, daily intake of sodium 1500mg , cholesterol 200mg , calories 30% from fat and 7% or less from saturated fats, daily to have 5 or more servings of fruits and vegetables.  Biometrics:  Pre Biometrics - 02/10/21 1209      Pre Biometrics   Weight 60.5 kg    Waist Circumference 28.5 inches    Hip Circumference 38 inches    Waist to Hip Ratio 0.75 %    BMI (Calculated) 20.89    Triceps Skinfold 17 mm    % Body Fat 31.4 %    Grip Strength 17 kg    Flexibility 0 in    Single Leg Stand 0 seconds            Nutrition Therapy Plan and Nutrition Goals:   Nutrition Assessments:  Nutrition Assessments - 02/09/21 1334      MEDFICTS Scores   Pre Score 15          MEDIFICTS Score Key:  ?70 Need to make dietary changes   40-70  Heart Healthy Diet  ? 40 Therapeutic Level Cholesterol Diet   Picture Your Plate Scores:  <54 Unhealthy dietary pattern with much room for improvement.  41-50 Dietary pattern unlikely to meet recommendations for good health and room for improvement.  51-60 More healthful dietary pattern, with some room for improvement.   >60 Healthy dietary pattern, although there may be some specific behaviors that could be improved.    Nutrition Goals  Re-Evaluation:   Nutrition Goals Discharge (Final Nutrition Goals Re-Evaluation):   Psychosocial: Target Goals: Acknowledge presence or absence of significant depression and/or stress, maximize coping skills, provide positive support system. Participant is able to verbalize types and ability to use techniques and skills needed for reducing stress and depression.  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      Family Dynamics   Good Support System? Yes    Comments Patient cares for her adult son who has bipolar disorder. He lives with her. She also has 2 other sons that live near her and she has several grandchildren. She says her son is very difficult to manage at times and he refuses medication for his disorder. Her other sons do help her out with him but she is the primary caregiver. She says she does feel depressed and her pcp has recommended antidepressants but she refuses to take. She does take Alprazolam BID for anxiety and to help her sleep. She says her depressed feelings are mainly related to her declining physically health and not being able to clean her house like she used to do and do other activities outside the home. She hopes the program will help her get some of her strength back to be able to do the activities she wants to do.      Barriers   Psychosocial barriers to participate in program Psychosocial barriers identified (see note)      Screening Interventions   Interventions Provide feedback about the scores to participant;Encouraged to exercise    Expected Outcomes Short Term goal: Identification and review with participant of any Quality of Life or Depression concerns found by scoring the questionnaire.;Long Term goal:  The participant improves quality of Life and PHQ9 Scores as seen by post scores and/or verbalization of changes           Quality of Life Scores:  Quality of Life - 02/09/21 1559      Quality of Life   Select Quality of Life      Quality of Life Scores   Health/Function Pre 17.43 %    Socioeconomic Pre 20.83 %    Psych/Spiritual Pre 12.71 %    Family Pre 19.88 %    GLOBAL Pre 17.34 %          Scores of 19 and below usually indicate a poorer quality of life in these areas.  A difference of  2-3 points is a clinically meaningful difference.  A difference of 2-3 points in the total score of the Quality of Life Index has been associated with significant improvement in overall quality of life, self-image, physical symptoms, and general health in studies assessing change in quality of life.  PHQ-9: Recent Review Flowsheet Data    Depression screen [facility] 2/9 02/09/2021 04/18/2017 01/15/2017  09/27/2016 03/19/2016   Decreased Interest 1 0 0 0 0   Down, Depressed, Hopeless 3 0 0 0 0   PHQ - 2 Score 4 0 0 0 0   Altered sleeping 0 - - - -   Tired, decreased energy 0 - - - -   Change in appetite 0 - - - -   Feeling bad or failure about yourself  3 - - - -   Trouble concentrating 3 - - - -   Moving slowly or fidgety/restless 0 - - - -   Suicidal thoughts 0 - - - -   PHQ-9 Score 10 - - - -     Interpretation of Total Score  Total Score Depression Severity:  1-4 = Minimal depression, 5-9 = Mild depression, 10-14 = Moderate depression, 15-19 = Moderately severe depression, 20-27 = Severe depression   Psychosocial Evaluation and Intervention:  Psychosocial Evaluation - 02/10/21 0742      Psychosocial Evaluation & Interventions   Interventions Stress management education;Relaxation education    Comments Patient's initial QOL score was 17.34 overall scoring lowest in Pscho/Spiritual. Her PHQ-9 score was 8. She does have depressed feelings related to her declining health and she uses Alprazolam  for anxiety and to help her sleep. She does identify her adult son with bipolar disorder as a stressor in her life.    Expected Outcomes Patient will continue to have her anxiety managed and her depressed feelings will improve with her strength improving as she goes through the program.    Continue Psychosocial Services  No Follow up required           Psychosocial Re-Evaluation:   Psychosocial Discharge (Final Psychosocial Re-Evaluation):   Vocational Rehabilitation: Provide vocational rehab assistance to qualifying candidates.   Vocational Rehab Evaluation & Intervention:  Vocational Rehab - 02/09/21 1335      Initial Vocational Rehab Evaluation & Intervention   Assessment shows need for Vocational Rehabilitation No      Vocational Rehab Re-Evaulation   Comments Patient is retired and is not interested in returning to work. She does not need vocational rehab.           Education: Education Goals: Education classes will be provided on a weekly basis, covering required topics. Participant will state understanding/return demonstration of topics presented.  Learning Barriers/Preferences:  Learning Barriers/Preferences - 02/09/21 1335      Learning Barriers/Preferences   Learning Barriers Hearing    Learning Preferences Audio           Education Topics: Hypertension, Hypertension Reduction -Define heart disease and high blood pressure. Discus how high blood pressure affects the body and ways to reduce high blood pressure.   Exercise and Your Heart -Discuss why it is important to exercise, the FITT principles of exercise, normal and abnormal responses to exercise, and how to exercise safely.   Angina -Discuss definition of angina, causes of angina, treatment of angina, and how to decrease risk of having angina.   Cardiac Medications -Review what the following cardiac medications are used for, how they affect the body, and side effects that may occur when taking  the medications.  Medications include Aspirin, Beta blockers, calcium channel blockers, ACE Inhibitors, angiotensin receptor blockers, diuretics, digoxin, and antihyperlipidemics.   Congestive Heart Failure -Discuss the definition of CHF, how to live with CHF, the signs and symptoms of CHF, and how keep track of weight and sodium intake.   Heart Disease and Intimacy -Discus the effect sexual activity has  on the heart, how changes occur during intimacy as we age, and safety during sexual activity.   Smoking Cessation / COPD -Discuss different methods to quit smoking, the health benefits of quitting smoking, and the definition of COPD.   Nutrition I: Fats -Discuss the types of cholesterol, what cholesterol does to the heart, and how cholesterol levels can be controlled.   Nutrition II: Labels -Discuss the different components of food labels and how to read food label   Heart Parts/Heart Disease and PAD -Discuss the anatomy of the heart, the pathway of blood circulation through the heart, and these are affected by heart disease.   Stress I: Signs and Symptoms -Discuss the causes of stress, how stress may lead to anxiety and depression, and ways to limit stress.   Stress II: Relaxation -Discuss different types of relaxation techniques to limit stress.   Warning Signs of Stroke / TIA -Discuss definition of a stroke, what the signs and symptoms are of a stroke, and how to identify when someone is having stroke.   Knowledge Questionnaire Score:  Knowledge Questionnaire Score - 02/09/21 1335      Knowledge Questionnaire Score   Pre Score 17/24           Core Components/Risk Factors/Patient Goals at Admission:  Personal Goals and Risk Factors at Admission - 02/09/21 1335      Core Components/Risk Factors/Patient Goals on Admission    Weight Management Weight Gain    Diabetes Yes    Intervention Provide education about signs/symptoms and action to take for  hypo/hyperglycemia.;Provide education about proper nutrition, including hydration, and aerobic/resistive exercise prescription along with prescribed medications to achieve blood glucose in normal ranges: Fasting glucose 65-99 mg/dL    Expected Outcomes Short Term: Participant verbalizes understanding of the signs/symptoms and immediate care of hyper/hypoglycemia, proper foot care and importance of medication, aerobic/resistive exercise and nutrition plan for blood glucose control.    Stress Yes    Intervention Offer individual and/or small group education and counseling on adjustment to heart disease, stress management and health-related lifestyle change. Teach and support self-help strategies.    Expected Outcomes Short Term: Participant demonstrates changes in health-related behavior, relaxation and other stress management skills, ability to obtain effective social support, and compliance with psychotropic medications if prescribed.    Personal Goal Other Yes    Personal Goal Get stronger and be able to return to her ADL's.    Intervention Provide cardiac rehab 3 days/week and guided exercise prescription 2 days/week at home.    Expected Outcomes Patient will complete the program meeting both program and personal goals.           Core Components/Risk Factors/Patient Goals Review:    Core Components/Risk Factors/Patient Goals at Discharge (Final Review):    ITP Comments:   Comments: Patient arrived for 1st visit/orientation/education at 1230. Patient was referred to CR by Dr. Sharrell Ku due to NSTEMI (121.4) and S/P Stent placement (Z95.5). During orientation advised patient on arrival and appointment times what to wear, what to do before, during and after exercise. Reviewed attendance and class policy.  Pt is scheduled to return Cardiac Rehab on 02/13/21 at 11:00. Pt was advised to come to class 15 minutes before class starts.  Discussed RPE/Dpysnea scales. Patient participated in warm up  stretches. Patient was able to complete 6 minute walk test.  Telemetry:NSR. Patient was measured for the equipment. Discussed equipment safety with patient. Took patient pre-anthropometric measurements. Patient finished visit at 1430.

## 2021-02-10 NOTE — Progress Notes (Signed)
Referring Provider: Ignatius Specking, MD Primary Care Physician:  [name], MD Primary GI:  Dr. Marletta Lor  Chief Complaint  Patient presents with  . Diarrhea    Last episode was about 2 weeks ago but prior was having daily x 2 1/2 weeks, several times in a day    HPI:   Gabrielle Wood is a 75 y.o. female who presents to the clinic today for diarrhea.  Last seen in 2019.  She was scheduled to undergo colonoscopy at that time for surveillance purposes that she has a history of adenomatous colon polyps in the past (last colonoscopy around 2011).  She canceled this however in 2019 as her son was currently in the hospital.  She was rereferred back to [place] due to recent onset of diarrhea a few months ago.  Though she states this is completely resolved.  No melena hematochezia.  No unintentional weight loss.  No family history of colorectal malignancy.  She recently suffered an NSTEMI on 12/07/2020 requiring drug-eluting stent placement of the left circumflex.  She is currently on ASA and Brilinta.  Past Medical History:  Diagnosis Date  . Arthritis   . COPD (chronic obstructive pulmonary disease) (HCC)   . Diabetes mellitus   . DVT (deep venous thrombosis) (HCC)    years ago  . GERD (gastroesophageal reflux disease)   . High cholesterol   . Hypertension   . Hypothyroidism   . Thyroid disease     Past Surgical History:  Procedure Laterality Date  . BACK SURGERY     lumbar x3   . ESOPHAGOGASTRODUODENOSCOPY  04/04/2012   Moderate gastritis/MODERATE Duodenitis in the duodenal bulb duodenum  . EUS  05/14/2012   Normal pancreas, gallbladder,biliary tree/ Incidental finding of simple-appearing right renal cyst.  . TONSILLECTOMY    . TUBAL LIGATION    . VEIN SURGERY     stripping years ago but recent laser surgery; bilateral legs    Current Outpatient Medications  Medication Sig Dispense Refill  . ALPRAZolam (XANAX) 0.5 MG tablet Take 0.5 mg by mouth 2 (two) times daily.    [name]  amLODipine (NORVASC) 10 MG tablet Take 5 mg by mouth daily.    [name] aspirin EC 81 MG tablet Take 81 mg by mouth daily. Swallow whole.    . baclofen (LIORESAL) 10 MG tablet Take 1 tablet by mouth 2 (two) times daily as needed.  2  . Calcium Carbonate (CALCIUM 600 PO) Take by mouth daily.     [name] gabapentin (NEURONTIN) 100 MG capsule 2 capsules twice a day, 3 capsules at night    . HUMALOG KWIKPEN 200 UNIT/ML SOPN Inject 2-5 Units into the skin 3 (three) times daily. If glucose is above 90 and she is eating  3  . levothyroxine (SYNTHROID, LEVOTHROID) 137 MCG tablet Take 137 mcg by mouth daily before breakfast.    . metFORMIN (GLUCOPHAGE) 500 MG tablet TAKE 1 TABLET BY MOUTH TWICE DAILY (Patient taking differently: Take 500 mg by mouth 2 (two) times daily with a meal. 2 tabs bid) 180 tablet 0  . nitroGLYCERIN (NITROSTAT) 0.4 MG SL tablet Place 0.4 mg under the tongue every 5 (five) minutes as needed for chest pain.    . ticagrelor (BRILINTA) 90 MG TABS tablet Take 90 mg by mouth 2 (two) times daily.    . TRESIBA FLEXTOUCH 200 UNIT/ML SOPN Inject 35 Units into the skin at bedtime. Taking in am  3  . valsartan (DIOVAN) 80 MG  tablet Take 80 mg by mouth daily.    [name] atorvastatin (LIPITOR) 40 MG tablet     . EASY COMFORT PEN NEEDLES 31G X 8 MM MISC AS DIRECTED FOUR TIMES DAILY    . fexofenadine (ALLEGRA) 60 MG tablet Take by mouth.    . IBU 800 MG tablet Take 800 mg by mouth 3 (three) times daily as needed.    . Lancets (ONETOUCH DELICA PLUS LANCET33G) MISC Apply topically 4 (four) times daily.    [name] ULTRA test strip 4 (four) times daily.     No current facility-administered medications for this visit.    Allergies as of 02/01/2021 - Review Complete 02/01/2021  Allergen Reaction Noted  . Ciprocin-fluocin-procin [fluocinolone]  09/26/2015  . Lipitor [atorvastatin] Other (See Comments) 04/03/2012  . Lisinopril Cough 06/09/2015  . Red yeast rice [cholestin] Itching and Rash 04/03/2012    Family  History  Problem Relation Age of Onset  . Pancreatic cancer Mother        diagnosed at age 37s, died from heart attack  . Inflammatory bowel disease Brother        crohn's, his dgt deceased from colon cancer at age 64  . Colon cancer Neg Hx     Social History   Socioeconomic History  . Marital status: Widowed    Spouse name: Not on file  . Number of children: 2  . Years of education: Not on file  . Highest education level: Not on file  Occupational History  . Occupation: retired, Advertising copywriter at [employer]  Tobacco Use  . Smoking status: Former Smoker    Packs/day: 0.50  . Smokeless tobacco: Never Used  Vaping Use  . Vaping Use: Never used  Substance and Sexual Activity  . Alcohol use: No  . Drug use: No  . Sexual activity: Not on file  Other Topics Concern  . Not on file  Social History Narrative  . Not on file   Social Determinants of Health   Financial Resource Strain: Not on file  Food Insecurity: Not on file  Transportation Needs: Not on file  Physical Activity: Not on file  Stress: Not on file  Social Connections: Not on file    Subjective: Review of Systems  Constitutional: Negative for chills and fever.  HENT: Negative for congestion and hearing loss.   Eyes: Negative for blurred vision and double vision.  Respiratory: Negative for cough and shortness of breath.   Cardiovascular: Negative for chest pain and palpitations.  Gastrointestinal: Negative for abdominal pain, blood in stool, constipation, diarrhea, heartburn, melena and vomiting.  Genitourinary: Negative for dysuria and urgency.  Musculoskeletal: Negative for joint pain and myalgias.  Skin: Negative for itching and rash.  Neurological: Negative for dizziness and headaches.  Psychiatric/Behavioral: Negative for depression. The patient is not nervous/anxious.      Objective: BP 114/64   Pulse 76   Temp (!) 97.3 F (36.3 C)   Ht 5\' 7"  (1.702 m)   Wt 135 lb 6.4 oz (61.4 kg)   BMI 21.21 kg/m   Physical Exam Constitutional:      Appearance: Normal appearance.  HENT:     Head: Normocephalic and atraumatic.  Eyes:     Extraocular Movements: Extraocular movements intact.     Conjunctiva/sclera: Conjunctivae normal.  Cardiovascular:     Rate and Rhythm: Normal rate and regular rhythm.  Pulmonary:     Effort: Pulmonary effort is normal.     Breath sounds: Normal breath sounds.  Abdominal:  General: Bowel sounds are normal.     Palpations: Abdomen is soft.  Musculoskeletal:        General: No swelling. Normal range of motion.     Cervical back: Normal range of motion and neck supple.  Skin:    General: Skin is warm and dry.     Coloration: Skin is not jaundiced.  Neurological:     General: No focal deficit present.     Mental Status: She is alert and oriented to person, place, and time.  Psychiatric:        Mood and Affect: Mood normal.        Behavior: Behavior normal.      Assessment: *History of adenomatous colon polyps *GERD *Diarrhea  Plan: Patient's diarrhea has resolved for now.  We will continue to monitor this.    She certainly warrants colonoscopy for surveillance purposes given history of adenomatous colon polyps.  However, given her recent NSTEMI on 12/07/2020 status post DES to left circumflex, I would recommend we delay this for 1 year.  She is currently on ASA and Brilinta.  Patient follow-up in 6 months or sooner if needed.  02/10/2021 11:18 AM   Disclaimer: This note was dictated with voice recognition software. Similar sounding words can inadvertently be transcribed and may not be corrected upon review.

## 2021-02-13 ENCOUNTER — Other Ambulatory Visit: Payer: Self-pay

## 2021-02-13 ENCOUNTER — Encounter (HOSPITAL_COMMUNITY)
Admission: RE | Admit: 2021-02-13 | Discharge: 2021-02-13 | Disposition: A | Discharge status: Home / Self Care | Payer: Medicare Other | Source: Ambulatory Visit | Attending: Internal Medicine | Admitting: Internal Medicine

## 2021-02-13 DIAGNOSIS — Z955 Presence of coronary angioplasty implant and graft: Secondary | ICD-10-CM

## 2021-02-13 DIAGNOSIS — I214 Non-ST elevation (NSTEMI) myocardial infarction: Secondary | ICD-10-CM

## 2021-02-13 LAB — GLUCOSE, CAPILLARY: Glucose-Capillary: 75 mg/dL (ref 70–99)

## 2021-02-13 NOTE — Progress Notes (Signed)
Incomplete Session Note  Patient Details  Name: Gabrielle Wood MRN: [id number] Date of Birth: 1946/03/20 Referring Provider:   Flowsheet Row CARDIAC REHAB PHASE II ORIENTATION from 02/10/2021 in [facility]  Referring Provider Dr. Guy Sandifer did not complete her rehab session.  Patient did not complete rehab session due to low blood sugar. Her blood sugar was 65 when she came in. She drank orange juice and her blood pressure was 75, which is too low to exercise.

## 2021-02-15 ENCOUNTER — Other Ambulatory Visit: Payer: Self-pay

## 2021-02-15 ENCOUNTER — Encounter (HOSPITAL_COMMUNITY)
Admission: RE | Admit: 2021-02-15 | Discharge: 2021-02-15 | Disposition: A | Discharge status: Home / Self Care | Payer: Medicare Other | Source: Ambulatory Visit | Attending: Internal Medicine | Admitting: Internal Medicine

## 2021-02-15 DIAGNOSIS — I214 Non-ST elevation (NSTEMI) myocardial infarction: Secondary | ICD-10-CM

## 2021-02-15 DIAGNOSIS — Z955 Presence of coronary angioplasty implant and graft: Secondary | ICD-10-CM

## 2021-02-15 NOTE — Progress Notes (Signed)
Daily Session Note  Patient Details  Name: Gabrielle Wood MRN: [id number] Date of Birth: 1946-11-04 Referring Provider:   Flowsheet Row CARDIAC REHAB PHASE II ORIENTATION from 02/10/2021 in [facility]  Referring Provider Dr. Candis Musa      Encounter Date: 02/15/2021  Check In:  Session Check In - 02/15/21 1100      Check-In   Supervising physician immediately available to respond to emergencies [facility] MD immediately available    Physician(s) Dr. Harrington Challenger    Location [facility]    Staff Present Cathren Harsh, MS, Exercise Physiologist;Debra Wynetta Emery, RN, BSN    Virtual Visit No    Medication changes reported     No    Fall or balance concerns reported    Yes    Tobacco Cessation No Change    Warm-up and Cool-down Performed as group-led instruction    Resistance Training Performed Yes    VAD Patient? No    PAD/SET Patient? No      Pain Assessment   Currently in Pain? No/denies    Pain Score 0-No pain    Multiple Pain Sites No           Capillary Blood Glucose: No results found for this or any previous visit (from the past 24 hour(s)).    Social History   Tobacco Use  Smoking Status Former Smoker  . Packs/day: 0.50  Smokeless Tobacco Never Used    Goals Met:  Independence with exercise equipment Exercise tolerated well No report of cardiac concerns or symptoms Strength training completed today  Goals Unmet:  Not Applicable  Comments: check out 1200   Dr. Kathie Dike is Medical Director for [facility].

## 2021-02-17 ENCOUNTER — Encounter (HOSPITAL_COMMUNITY)
Admission: RE | Admit: 2021-02-17 | Discharge: 2021-02-17 | Disposition: A | Discharge status: Home / Self Care | Payer: Medicare Other | Source: Ambulatory Visit | Attending: Internal Medicine | Admitting: Internal Medicine

## 2021-02-17 ENCOUNTER — Other Ambulatory Visit: Payer: Self-pay

## 2021-02-17 DIAGNOSIS — I214 Non-ST elevation (NSTEMI) myocardial infarction: Secondary | ICD-10-CM

## 2021-02-17 DIAGNOSIS — Z955 Presence of coronary angioplasty implant and graft: Secondary | ICD-10-CM | POA: Diagnosis not present

## 2021-02-17 NOTE — Progress Notes (Signed)
Daily Session Note  Patient Details  Name: Gabrielle Wood MRN: [id number] Date of Birth: 12/21/1945 Referring :   Flowsheet Row CARDIAC REHAB PHASE II ORIENTATION from 02/10/2021 in [facility]  Referring  Dr. Assar      Encounter Date: 02/17/2021  Check In:  Session Check In - 02/17/21 1100      Check-In   Supervising physician immediately available to respond to emergencies [facility] MD immediately available    Physician(s) Dr. McDowell    Location [facility]    Staff Present Dalton Fletcher, MS, ACSM-CEP, Exercise Physiologist;Madison Karch, MS, Exercise Physiologist    Virtual Visit No    Medication changes reported     No    Fall or balance concerns reported    Yes    Comments Patient has not fallen in the past year but uses a cane, loses her balance without falling, and has osteoarthritis which affects her mobility    Tobacco Cessation No Change    Warm-up and Cool-down Performed as group-led instruction    Resistance Training Performed Yes    VAD Patient? Yes    PAD/SET Patient? No      Pain Assessment   Currently in Pain? No/denies    Pain Score 0-No pain    Multiple Pain Sites No           Capillary Blood Glucose: No results found for this or any previous visit (from the past 24 hour(s)).    Social History   Tobacco Use  Smoking Status Former Smoker  . Packs/day: 0.50  Smokeless Tobacco Never Used    Goals Met:  Independence with exercise equipment Exercise tolerated well No report of cardiac concerns or symptoms Strength training completed today  Goals Unmet:  Not Applicable  Comments: checkout time is 1200   Dr. Jehanzeb Memon is Medical Director for [facility]. 

## 2021-02-20 ENCOUNTER — Encounter (HOSPITAL_COMMUNITY)
Admission: RE | Admit: 2021-02-20 | Discharge: 2021-02-20 | Disposition: A | Discharge status: Home / Self Care | Payer: Medicare Other | Source: Ambulatory Visit | Attending: Internal Medicine | Admitting: Internal Medicine

## 2021-02-20 ENCOUNTER — Other Ambulatory Visit: Payer: Self-pay

## 2021-02-20 DIAGNOSIS — I214 Non-ST elevation (NSTEMI) myocardial infarction: Secondary | ICD-10-CM

## 2021-02-20 DIAGNOSIS — Z955 Presence of coronary angioplasty implant and graft: Secondary | ICD-10-CM

## 2021-02-20 LAB — GLUCOSE, CAPILLARY
Glucose-Capillary: 67 mg/dL — ABNORMAL LOW (ref 70–99)
Glucose-Capillary: 120 mg/dL — ABNORMAL HIGH (ref 70–99)

## 2021-02-20 NOTE — Progress Notes (Signed)
Incomplete Session Note  Patient Details  Name: Gabrielle Wood MRN: [id number] Date of Birth: 01/25/1946 Referring Provider:   Flowsheet Row CARDIAC REHAB PHASE II ORIENTATION from 02/10/2021 in [facility] C[facility] RE[facility]  Referring Provider Dr. Guy Sandifer did not complete her rehab session.  Her CBG was 55 upon arrival. Pt is is a diabetic on insulin, and per departmental guidelines, CBG must be 110 or greater to exercise. Pt given orange juice and checked 15 minutes later and it was 67. Pt reported that she felt shaky and could tell her sugar was low. She reported that she ate 2 eggs, 2 pieces of bacon, toast, and jelly for breakfast around 0900 before taking her insulin. We then gave pt peanut butter crackers and an 8 oz coca cola. She consumed this and then was rechecked about 15 minutes later.  Her CBG at this time was 120. She stated that she no longer felt shaky and felt comfortable driving. Pt was then discharged for the day.

## 2021-02-20 NOTE — Telephone Encounter (Signed)
Gabrielle Wood from Cardiac Rehab called and said that she came in today. She told [name] that she got up around 9 am and had 2 eggs, 2 bacon and toast with Jelly. She then took her insulin, at 10:45 at cardiac rehab her sugar was 55. 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 They gave her some orange juice and about 15 minutes later it was 67. They then gave her some peanut butter crackers and 20 minutes later it was 120. This patient will see you in the morning for a follow up but he just wanted you to know

## 2021-02-21 ENCOUNTER — Ambulatory Visit: Payer: Medicare Other | Admitting: Nurse Practitioner

## 2021-02-21 ENCOUNTER — Encounter: Payer: Self-pay | Admitting: Nurse Practitioner

## 2021-02-21 ENCOUNTER — Other Ambulatory Visit: Payer: Self-pay

## 2021-02-21 VITALS — BP 138/72 | HR 60 | Ht 67.0 in | Wt 139.0 lb

## 2021-02-21 DIAGNOSIS — Z794 Long term (current) use of insulin: Secondary | ICD-10-CM

## 2021-02-21 DIAGNOSIS — E039 Hypothyroidism, unspecified: Secondary | ICD-10-CM | POA: Diagnosis not present

## 2021-02-21 DIAGNOSIS — E1159 Type 2 diabetes mellitus with other circulatory complications: Secondary | ICD-10-CM | POA: Diagnosis not present

## 2021-02-21 DIAGNOSIS — E559 Vitamin D deficiency, unspecified: Secondary | ICD-10-CM | POA: Diagnosis not present

## 2021-02-21 DIAGNOSIS — IMO0002 Reserved for concepts with insufficient information to code with codable children: Secondary | ICD-10-CM

## 2021-02-21 DIAGNOSIS — E1165 Type 2 diabetes mellitus with hyperglycemia: Secondary | ICD-10-CM | POA: Diagnosis not present

## 2021-02-21 LAB — POCT UA - MICROALBUMIN: Microalbumin Ur, POC: 150 mg/L

## 2021-02-21 MED ORDER — METFORMIN HCL 500 MG PO TABS: 500.0000 mg | ORAL_TABLET | Freq: Two times a day (BID) | ORAL | Status: DC

## 2021-02-21 NOTE — Telephone Encounter (Signed)
Noted, thanks!

## 2021-02-21 NOTE — Patient Instructions (Signed)

## 2021-02-21 NOTE — Progress Notes (Signed)
Endocrinology Follow Up Note       02/21/2021, 12:10 PM   Subjective:    Patient ID: Gabrielle Wood, female    DOB: March 19, 1946.  Gabrielle Wood is being seen in follow up after being seen in consultation for management of currently uncontrolled symptomatic diabetes requested by  Gabrielle Potts, MD.   Past Medical History:  Diagnosis Date  . Arthritis   . COPD (chronic obstructive pulmonary disease) (HCC)   . Diabetes mellitus   . DVT (deep venous thrombosis) (HCC)    years ago  . GERD (gastroesophageal reflux disease)   . High cholesterol   . Hypertension   . Hypothyroidism   . Thyroid disease     Past Surgical History:  Procedure Laterality Date  . BACK SURGERY     lumbar x3   . ESOPHAGOGASTRODUODENOSCOPY  04/04/2012   Moderate gastritis/MODERATE Duodenitis in the duodenal bulb duodenum  . EUS  05/14/2012   Normal pancreas, gallbladder,biliary tree/ Incidental finding of simple-appearing right renal cyst.  . TONSILLECTOMY    . TUBAL LIGATION    . VEIN SURGERY     stripping years ago but recent laser surgery; bilateral legs    Social History   Socioeconomic History  . Marital status: Widowed    Spouse name: Not on file  . Number of children: 2  . Years of education: Not on file  . Highest education level: Not on file  Occupational History  . Occupation: retired, Advertising copywriter at [employer]  Tobacco Use  . Smoking status: Former Smoker    Packs/day: 0.50  . Smokeless tobacco: Never Used  Vaping Use  . Vaping Use: Never used  Substance and Sexual Activity  . Alcohol use: No  . Drug use: No  . Sexual activity: Not on file  Other Topics Concern  . Not on file  Social History Narrative  . Not on file   Social Determinants of Health   Financial Resource Strain: Not on file  Food Insecurity: Not on file  Transportation Needs: Not on file  Physical Activity: Not on file  Stress: Not on  file  Social Connections: Not on file    Family History  Problem Relation Age of Onset  . Pancreatic cancer Mother        diagnosed at age 47s, died from heart attack  . Inflammatory bowel disease Brother        crohn's, his dgt deceased from colon cancer at age 53  . Colon cancer Neg Hx     Outpatient Encounter Medications as of 02/21/2021  Medication Sig  . ALPRAZolam (XANAX) 0.5 MG tablet Take 0.5 mg by mouth 2 (two) times daily.  Gabrielle Wood amLODipine (NORVASC) 10 MG tablet Take 5 mg by mouth daily.  Gabrielle Wood aspirin EC 81 MG tablet Take 81 mg by mouth daily. Swallow whole.  Gabrielle Wood atorvastatin (LIPITOR) 40 MG tablet   . baclofen (LIORESAL) 10 MG tablet Take 1 tablet by mouth 2 (two) times daily as needed.  . Calcium Carbonate (CALCIUM 600 PO) Take by mouth daily.   Gabrielle Wood EASY COMFORT PEN NEEDLES 31G X 8 MM MISC AS DIRECTED FOUR TIMES DAILY  . fexofenadine (ALLEGRA) 60  MG tablet Take by mouth.  . gabapentin (NEURONTIN) 100 MG capsule 2 capsules twice a day, 3 capsules at night  . HUMALOG KWIKPEN 200 UNIT/ML SOPN Inject 2-5 Units into the skin 3 (three) times daily. If glucose is above 90 and she is eating  . IBU 800 MG tablet Take 800 mg by mouth 3 (three) times daily as needed.  . Lancets (ONETOUCH DELICA PLUS LANCET33G) MISC Apply topically 4 (four) times daily.  Gabrielle Wood levothyroxine (SYNTHROID, LEVOTHROID) 137 MCG tablet Take 137 mcg by mouth daily before breakfast.  . nitroGLYCERIN (NITROSTAT) 0.4 MG SL tablet Place 0.4 mg under the tongue every 5 (five) minutes as needed for chest pain.  Gabrielle Wood ULTRA test strip 4 (four) times daily.  . ticagrelor (BRILINTA) 90 MG TABS tablet Take 90 mg by mouth 2 (two) times daily.  Gabrielle Wood FLEXTOUCH 200 UNIT/ML SOPN Inject 25 Units into the skin at bedtime. Taking in am  . valsartan (DIOVAN) 80 MG tablet Take 80 mg by mouth daily.  . [DISCONTINUED] metFORMIN (GLUCOPHAGE) 500 MG tablet TAKE 1 TABLET BY MOUTH TWICE DAILY (Patient taking differently: Take 500 mg by  mouth 2 (two) times daily with a meal. 2 tabs bid)  . metFORMIN (GLUCOPHAGE) 500 MG tablet Take 1 tablet (500 mg total) by mouth 2 (two) times daily with a meal. 2 tabs bid   No facility-administered encounter medications on file as of 02/21/2021.    ALLERGIES: Allergies  Allergen Reactions  . Ciprocin-Fluocin-Procin [Fluocinolone]     Nightmares  . Lipitor [Atorvastatin] Other (See Comments)    Muscle aches, pain  . Lisinopril Cough  . Red Yeast Rice [Cholestin] Itching and Rash    VACCINATION STATUS: Immunization History  Administered Date(s) Administered  . Pneumococcal Polysaccharide-23 04/04/2012    Diabetes She presents for her follow-up diabetic visit. She has type 2 diabetes mellitus. Onset time: Was diagnosed at approx age of 75. Her disease course has been improving. Hypoglycemia symptoms include sweats and tremors. Associated symptoms include fatigue. Pertinent negatives for diabetes include no polydipsia and no polyuria. Hypoglycemia complications include nocturnal hypoglycemia. Symptoms are improving. Diabetic complications include heart disease. (Had a MI in the past) Risk factors for coronary artery disease include diabetes mellitus, dyslipidemia, hypertension, post-menopausal and sedentary lifestyle. Current diabetic treatment includes intensive insulin program and oral agent (monotherapy). She is compliant with treatment most of the time. Her weight is increasing steadily. She is following a generally healthy diet. When asked about meal planning, she reported none. She has not had a previous visit with a dietitian. She participates in exercise intermittently. Her home blood glucose trend is decreasing steadily. (She presents today with her meter and logs showing improved glycemic profile, however she is still experiencing some fasting hypoglycemia.  She has had several occasions where she could not participate in cardiac rehab due to low glucose.  She has cut back her  Metformin to 500 mg po twice daily with meals since last visit.) An ACE inhibitor/angiotensin II receptor blocker is being taken. She does not see a podiatrist.Eye exam is current.  Hyperlipidemia This is a chronic problem. The current episode started more than 1 year ago. The problem is uncontrolled. Recent lipid tests were reviewed and are high. Exacerbating diseases include chronic renal disease, diabetes and hypothyroidism. There are no known factors aggravating her hyperlipidemia. Current antihyperlipidemic treatment includes statins. The current treatment provides mild improvement of lipids. Compliance problems include adherence to diet and adherence to exercise.  Risk factors for  coronary artery disease include diabetes mellitus, dyslipidemia, hypertension, post-menopausal and a sedentary lifestyle.  Hypertension This is a chronic problem. The current episode started more than 1 year ago. The problem has been resolved since onset. The problem is controlled. Associated symptoms include sweats. Agents associated with hypertension include thyroid hormones. Risk factors for coronary artery disease include diabetes mellitus, dyslipidemia, post-menopausal state and sedentary lifestyle. Past treatments include angiotensin blockers and calcium channel blockers. There are no compliance problems.  Hypertensive end-organ damage includes kidney disease and CAD/MI. Identifiable causes of hypertension include chronic renal disease and a thyroid problem.     Review of systems  Constitutional: + Minimally fluctuating body weight,  current Body mass index is 21.77 kg/m. , no fatigue, no subjective hyperthermia, no subjective hypothermia Eyes: no blurry vision, no xerophthalmia ENT: no sore throat, no nodules palpated in throat, no dysphagia/odynophagia, no hoarseness Cardiovascular: no chest pain, no shortness of breath, no palpitations, no leg swelling Respiratory: no cough, no shortness of  breath Gastrointestinal: no nausea/vomiting/diarrhea Musculoskeletal: no muscle/joint aches Skin: no rashes, no hyperemia Neurological: no tremors, no numbness, no tingling, no dizziness Psychiatric: no depression, no anxiety  Objective:     BP 138/72 (BP Location: Left Arm)   Pulse 60   Ht 5\' 7"  (1.702 m)   Wt 139 lb (63 kg)   BMI 21.77 kg/m   Wt Readings from Last 3 Encounters:  02/21/21 139 lb (63 kg)  02/10/21 133 lb 6.1 oz (60.5 kg)  02/10/21 133 lb 6.1 oz (60.5 kg)     BP Readings from Last 3 Encounters:  02/21/21 138/72  02/10/21 124/80  02/07/21 136/75     Physical Exam- Limited  Constitutional:  Body mass index is 21.77 kg/m. , not in acute distress, normal state of mind Eyes:  EOMI, no exophthalmos Neck: Supple Cardiovascular: RRR, no murmers, rubs, or gallops, no edema Respiratory: Adequate breathing efforts, no crackles, rales, rhonchi, or wheezing Musculoskeletal: no gross deformities, strength intact in all four extremities, no gross restriction of joint movements Skin:  no rashes, no hyperemia Neurological: no tremor with outstretched hands  POCT ABI Results 02/21/21   Right ABI:  1.16      Left ABI:  1.22  Right leg systolic / diastolic: 160/79 mmHg Left leg systolic / diastolic: 168/83 mmHg  Arm systolic / diastolic: 138/72 mmHG  Detailed report will be scanned into patient chart.    CMP ( most recent) CMP     Component Value Date/Time   NA 134 (L) 08/21/2019 1539   NA 139 07/17/2017 0912   K 3.6 08/21/2019 1539   CL 99 08/21/2019 1539   CO2 25 08/21/2019 1539   GLUCOSE 263 (H) 08/21/2019 1539   BUN 22 08/21/2019 1539   BUN 15 07/17/2017 0912   CREATININE 0.88 08/21/2019 1539   CREATININE 0.78 09/20/2016 0923   CALCIUM 9.5 08/21/2019 1539   PROT 6.7 07/17/2017 0912   ALBUMIN 4.4 07/17/2017 0912   AST 13 07/17/2017 0912   ALT 8 07/17/2017 0912   ALKPHOS 56 07/17/2017 0912   BILITOT 0.5 07/17/2017 0912   GFRNONAA >60 08/21/2019  1539   GFRNONAA 77 09/20/2016 0923   GFRAA >60 08/21/2019 1539   GFRAA 88 09/20/2016 0923     Diabetic Labs (most recent): Lab Results  Component Value Date   HGBA1C 9.6 (H) 04/11/2017   HGBA1C 8.5 (H) 01/04/2017   HGBA1C 7.7 (H) 09/20/2016     Lipid Panel ( most recent) Lipid Panel  No results found for: CHOL, TRIG, HDL, CHOLHDL, VLDL, LDLCALC, LDLDIRECT, LABVLDL    Lab Results  Component Value Date   TSH 19.240 (H) 07/17/2017   TSH 1.91 09/20/2016   TSH 3.05 06/14/2016   TSH 2.86 03/12/2016   TSH 4.280 04/04/2012   FREET4 1.53 07/17/2017   FREET4 1.5 09/20/2016   FREET4 1.6 06/14/2016   FREET4 1.5 03/12/2016           Assessment & Plan:   1) Uncontrolled type 2 diabetes mellitus with circulatory disorder, with long-term current use of insulin (HCC)  - Judiann Celia Seward has currently uncontrolled symptomatic type 2 DM since 75 years of age, with most recent A1c of 9%.   She presents today with her meter and logs showing improved glycemic profile, however she is still experiencing some fasting hypoglycemia.  She has had several occasions where she could not participate in cardiac rehab due to low glucose.  She has cut back her Metformin to 500 mg po twice daily with meals since last visit.  -Recent labs reviewed.  - I had a long discussion with her about the progressive nature of diabetes and the pathology behind its complications. 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 These are all discussed in detail with her.  - Nutritional counseling repeated at each appointment due to patients tendency to fall back in to old habits.  - The patient admits there is a room for improvement in their diet and drink choices. -  Suggestion is made for the patient to avoid simple carbohydrates from their diet including Cakes, Sweet  Desserts / Pastries, Ice Cream, Soda (diet and regular), Sweet Tea, Candies, Chips, Cookies, Sweet Pastries,  Store Bought Juices, Alcohol in Excess of  1-2 drinks a day, Artificial Sweeteners, Coffee Creamer, and "Sugar-free" Products. This will help patient to have stable blood glucose profile and potentially avoid unintended weight gain.   - I encouraged the patient to switch to  unprocessed or minimally processed complex starch and increased protein intake (animal or plant source), fruits, and vegetables.   - Patient is advised to stick to a routine mealtimes to eat 3 meals  a day and avoid unnecessary snacks ( to snack only to correct hypoglycemia).  - she will be scheduled with Norm Salt, RDN, CDE for diabetes education.  - I have approached her with the following individualized plan to manage  her diabetes and patient agrees:   -Given her fasting hypoglycemia, she is advised to lower her dose of Tresiba to 25 units SQ nightly.  She can continue current dose of Humalog 5-8 units TID with meals if glucose is above 100 and she is eating.  Specific instructions on how to titrate insulin dose based on glucose readings given to patient in writing.  She can continue at lower dose of Metformin 500 mg po twice daily with meals.   -she is encouraged to continue monitoring blood glucose 4 times daily, before meals and before bed, and to call the clinic if she has readings less than 70 or greater than 300 for 3 tests in a row.  - she is warned not to take insulin without proper monitoring per orders. - Adjustment parameters are given to her for hypo and hyperglycemia in writing.  -She is not a candidate for incretin therapy due to body habitus.  -  Specific targets for  A1c;  LDL, HDL,  and Triglycerides were discussed with the patient.  2) Blood Pressure /Hypertension:  her blood pressure is controlled to target.   she is advised to continue her current medications including Norvasc 10 mg po  daily, and Valsartan 80 mg p.o. daily with breakfast.  3) Lipids/Hyperlipidemia:    Review of her recent lipid panel from 12/09/2020 showed uncontrolled LDL at 103 .  she is advised to continue Lipitor 40 mg daily at bedtime (recently increased by her PCP).  Side effects and precautions discussed with her.  4)  Weight/Diet:  her Body mass index is 21.77 kg/m.  -  she is not a candidate for weight loss.  Exercise, and detailed carbohydrates information provided  -  detailed on discharge instructions.  5) Hypothyroidism- unspecified The details of her diagnosis are unclear.  She is currently on Levothyroxine 137 mcg po daily before breakfast (significantly higher dose than weight based recommendation of 100 mcg).  Will recheck TFTs prior to next visit and adjust dose if necessary.  6) Chronic Care/Health Maintenance: -she is on ACEI/ARB and Statin medications and is encouraged to initiate and continue to follow up with Ophthalmology, Dentist, Podiatrist at least yearly or according to recommendations, and advised to stay away from smoking. I have recommended yearly flu vaccine and pneumonia vaccine at least every 5 years; moderate intensity exercise for up to 150 minutes weekly; and sleep for at least 7 hours a day.  - she is advised to maintain close follow up with Gabrielle Potts, MD for primary care needs, as well as her other providers for optimal and coordinated care.   - Time spent on this patient care encounter:  40 min, of which > 50% was spent in  counseling and the rest reviewing her blood glucose logs , discussing her hypoglycemia and hyperglycemia episodes, reviewing her current and  previous labs / studies  ( including abstraction from other facilities) and medications  doses and developing a  long term treatment plan and documenting her care.   Please refer to Patient Instructions for Blood Glucose Monitoring and Insulin/Medications Dosing Guide"  in media tab for additional  information. Please  also refer to " Patient Self Inventory" in the Media  tab for reviewed elements of pertinent patient history.  Chauncey Fischer participated in the discussions, expressed understanding, and voiced agreement with the above plans.  All questions were answered to her satisfaction. she is encouraged to contact clinic should she have any questions or concerns prior to her return visit.   Follow up plan: - Return in about 3 months (around 05/24/2021) for Diabetes follow up with A1c in office, Thyroid follow up, Previsit labs, Bring glucometer and logs.  Ronny Bacon, [facility] [address] [place], [place] [address] Phone: [phone] Fax: [phone]  02/21/2021, 12:10 PM

## 2021-02-22 ENCOUNTER — Encounter (HOSPITAL_COMMUNITY)
Admission: RE | Admit: 2021-02-22 | Discharge: 2021-02-22 | Disposition: A | Discharge status: Home / Self Care | Payer: Medicare Other | Source: Ambulatory Visit | Attending: Internal Medicine | Admitting: Internal Medicine

## 2021-02-22 DIAGNOSIS — I214 Non-ST elevation (NSTEMI) myocardial infarction: Secondary | ICD-10-CM

## 2021-02-22 DIAGNOSIS — Z955 Presence of coronary angioplasty implant and graft: Secondary | ICD-10-CM

## 2021-02-22 NOTE — Progress Notes (Signed)
Incomplete Session Note  Patient Details  Name: Gabrielle Wood MRN: [id number] Date of Birth: October 19, 1946 Referring Provider:   Flowsheet Row CARDIAC REHAB PHASE II ORIENTATION from 02/10/2021 in [facility] CARDIAC REH[facility]ILITATION  Referring Provider Dr. Guy Sandifer did not complete her rehab session.  Her blood sugar was 309 and in order to meet exercise guidelines, her blood sugar had to be less than 300. She voiced that she was upset that she had to go home and could not complete the exercise session today.

## 2021-02-24 ENCOUNTER — Other Ambulatory Visit: Payer: Self-pay

## 2021-02-24 ENCOUNTER — Encounter (HOSPITAL_COMMUNITY)
Admission: RE | Admit: 2021-02-24 | Discharge: 2021-02-24 | Disposition: A | Discharge status: Home / Self Care | Payer: Medicare Other | Source: Ambulatory Visit | Attending: Internal Medicine | Admitting: Internal Medicine

## 2021-02-24 DIAGNOSIS — Z955 Presence of coronary angioplasty implant and graft: Secondary | ICD-10-CM

## 2021-02-24 DIAGNOSIS — I214 Non-ST elevation (NSTEMI) myocardial infarction: Secondary | ICD-10-CM | POA: Diagnosis not present

## 2021-02-24 NOTE — Progress Notes (Signed)
Daily Session Note  Patient Details  Name: Gabrielle Wood MRN: [id number] Date of Birth: [birth year] Referring Provider:   Flowsheet Row CARDIAC REHAB PHASE II ORIENTATION from 02/10/2021 in [place]  Referring Provider Dr. Candis Musa      Encounter Date: 02/24/2021  Check In:  Session Check In - 02/24/21 1100      Check-In   Supervising physician immediately available to respond to emergencies [facility] MD immediately available    Physician(s) Dr. Harrington Challenger    Location [facility]    Staff Present Cathren Harsh, MS, Exercise Physiologist;Debra Wynetta Emery, RN, BSN    Virtual Visit No    Medication changes reported     No    Fall or balance concerns reported    Yes    Tobacco Cessation No Change    Warm-up and Cool-down Performed as group-led instruction    Resistance Training Performed Yes    VAD Patient? No    PAD/SET Patient? No      Pain Assessment   Currently in Pain? No/denies    Pain Score 0-No pain    Multiple Pain Sites No           Capillary Blood Glucose: No results found for this or any previous visit (from the past 24 hour(s)).    Social History   Tobacco Use  Smoking Status Former Smoker  . Packs/day: 0.50  Smokeless Tobacco Never Used    Goals Met:  Independence with exercise equipment Exercise tolerated well No report of cardiac concerns or symptoms Strength training completed today  Goals Unmet:  Not Applicable  Comments: check out 1200   Dr. Kathie Dike is Medical Director for [facility] Pulmonary Rehab.

## 2021-02-27 ENCOUNTER — Encounter (HOSPITAL_COMMUNITY)
Admission: RE | Admit: 2021-02-27 | Discharge: 2021-02-27 | Disposition: A | Discharge status: Home / Self Care | Payer: Medicare Other | Source: Ambulatory Visit | Attending: Internal Medicine | Admitting: Internal Medicine

## 2021-02-27 ENCOUNTER — Other Ambulatory Visit: Payer: Self-pay

## 2021-02-27 DIAGNOSIS — I214 Non-ST elevation (NSTEMI) myocardial infarction: Secondary | ICD-10-CM

## 2021-02-27 DIAGNOSIS — Z955 Presence of coronary angioplasty implant and graft: Secondary | ICD-10-CM | POA: Diagnosis not present

## 2021-02-27 LAB — GLUCOSE, CAPILLARY: Glucose-Capillary: 265 mg/dL — ABNORMAL HIGH (ref 70–99)

## 2021-02-27 NOTE — Progress Notes (Signed)
Daily Session Note  Patient Details  Name: Gabrielle Wood MRN: [id number] Date of Birth: [birth year] Referring Provider:   Flowsheet Row CARDIAC REHAB PHASE II ORIENTATION from 02/10/2021 in [place]  Referring Provider Dr. Candis Musa      Encounter Date: 02/27/2021  Check In:  Session Check In - 02/27/21 1100      Check-In   Supervising physician immediately available to respond to emergencies [facility] MD immediately available    Physician(s) Dr. Domenic Polite    Location [facility]    Staff Present Cathren Harsh, MS, Exercise Physiologist;Debra Wynetta Emery, RN, BSN    Virtual Visit No    Medication changes reported     No    Fall or balance concerns reported    No    Tobacco Cessation No Change    Warm-up and Cool-down Not performed (comment)   late arrival   Resistance Training Performed No    VAD Patient? No    PAD/SET Patient? No      Pain Assessment   Currently in Pain? No/denies    Pain Score 0-No pain    Multiple Pain Sites No           Capillary Blood Glucose: No results found for this or any previous visit (from the past 24 hour(s)).    Social History   Tobacco Use  Smoking Status Former Smoker  . Packs/day: 0.50  Smokeless Tobacco Never Used    Goals Met:  Independence with exercise equipment Exercise tolerated well No report of cardiac concerns or symptoms Strength training completed today  Goals Unmet:  Not Applicable  Comments: check out 1200   Dr. Kathie Dike is Medical Director for [facility] Pulmonary Rehab.

## 2021-03-01 ENCOUNTER — Encounter (HOSPITAL_COMMUNITY): Payer: Medicare Other

## 2021-03-03 ENCOUNTER — Encounter (HOSPITAL_COMMUNITY): Payer: Medicare Other

## 2021-03-06 ENCOUNTER — Encounter (HOSPITAL_COMMUNITY)
Admission: RE | Admit: 2021-03-06 | Discharge: 2021-03-06 | Disposition: A | Discharge status: Home / Self Care | Payer: Medicare Other | Source: Ambulatory Visit | Attending: Internal Medicine | Admitting: Internal Medicine

## 2021-03-06 ENCOUNTER — Other Ambulatory Visit: Payer: Self-pay

## 2021-03-06 VITALS — Wt 136.5 lb

## 2021-03-06 DIAGNOSIS — Z955 Presence of coronary angioplasty implant and graft: Secondary | ICD-10-CM | POA: Diagnosis not present

## 2021-03-06 DIAGNOSIS — I214 Non-ST elevation (NSTEMI) myocardial infarction: Secondary | ICD-10-CM

## 2021-03-06 NOTE — Progress Notes (Signed)
I have reviewed a Home Exercise Prescription with Gabrielle Wood . Gabrielle Wood is  currently exercising at home.  The patient was advised to walk 3 days a week for 20 minutes at home.  Gabrielle Wood and I discussed how to progress their exercise prescription.  The patient stated that their goals were to get stronger.  The patient stated that they understand the exercise prescription.  We reviewed exercise guidelines, target heart rate during exercise, RPE Scale, weather conditions, NTG use, endpoints for exercise, warmup and cool down.  Patient is encouraged to come to me with any questions. I will continue to follow up with the patient to assist them with progression and safety.

## 2021-03-06 NOTE — Progress Notes (Signed)
Daily Session Note  Patient Details  Name: Gabrielle Wood MRN: [id number] Date of Birth: [birth year] Referring Provider:   Flowsheet Row CARDIAC REHAB PHASE II ORIENTATION from 02/10/2021 in [place]  Referring Provider Dr. Candis Musa      Encounter Date: 03/06/2021  Check In:  Session Check In - 03/06/21 1100      Check-In   Supervising physician immediately available to respond to emergencies [facility] MD immediately available    Physician(s) Dr. Harl Bowie    Location [facility]    Staff Present Cathren Harsh, MS, Exercise Physiologist;Dalton Kris Mouton, MS, ACSM-CEP, Exercise Physiologist    Virtual Visit No    Medication changes reported     No    Fall or balance concerns reported    No    Tobacco Cessation No Change    Warm-up and Cool-down Performed as group-led instruction    Resistance Training Performed Yes    VAD Patient? No    PAD/SET Patient? No      Pain Assessment   Currently in Pain? No/denies    Pain Score 0-No pain    Multiple Pain Sites No           Capillary Blood Glucose: No results found for this or any previous visit (from the past 24 hour(s)).    Social History   Tobacco Use  Smoking Status Former Smoker  . Packs/day: 0.50  Smokeless Tobacco Never Used    Goals Met:  Independence with exercise equipment Exercise tolerated well No report of cardiac concerns or symptoms Strength training completed today  Goals Unmet:  Not Applicable  Comments: check out 1200   Dr. Kathie Dike is Medical Director for [facility] Pulmonary Rehab.

## 2021-03-08 ENCOUNTER — Encounter (HOSPITAL_COMMUNITY)
Admission: RE | Admit: 2021-03-08 | Discharge: 2021-03-08 | Disposition: A | Discharge status: Home / Self Care | Payer: Medicare Other | Source: Ambulatory Visit | Attending: Internal Medicine | Admitting: Internal Medicine

## 2021-03-08 ENCOUNTER — Other Ambulatory Visit: Payer: Self-pay

## 2021-03-08 DIAGNOSIS — I214 Non-ST elevation (NSTEMI) myocardial infarction: Secondary | ICD-10-CM | POA: Diagnosis not present

## 2021-03-08 DIAGNOSIS — Z955 Presence of coronary angioplasty implant and graft: Secondary | ICD-10-CM | POA: Diagnosis not present

## 2021-03-08 NOTE — Progress Notes (Signed)
Cardiac Individual Treatment Plan  Patient Details  Name: Gabrielle Wood MRN: [id number] Date of Birth: 02-Aug-1946 Referring Provider:   Flowsheet Row CARDIAC REHAB PHASE II ORIENTATION from 02/10/2021 in [facility] CARDIAC REHABILITATION  Referring Provider Dr. Sharrell Ku      Initial Encounter Date:  Flowsheet Row CARDIAC REHAB PHASE II ORIENTATION from 02/10/2021 in [place] CARDIAC REHABILITATION  Date 02/10/21      Visit Diagnosis: S/P coronary artery stent placement  NSTEMI (non-ST elevated myocardial infarction) [facility])  Patient's Home Medications on Admission:  Current Outpatient Medications:  .  ALPRAZolam (XANAX) 0.5 MG tablet, Take 0.5 mg by mouth 2 (two) times daily., Disp: , Rfl:  .  amLODipine (NORVASC) 10 MG tablet, Take 5 mg by mouth daily., Disp: , Rfl:  .  aspirin EC 81 MG tablet, Take 81 mg by mouth daily. Swallow whole., Disp: , Rfl:  .  atorvastatin (LIPITOR) 40 MG tablet, , Disp: , Rfl:  .  baclofen (LIORESAL) 10 MG tablet, Take 1 tablet by mouth 2 (two) times daily as needed., Disp: , Rfl: 2 .  Calcium Carbonate (CALCIUM 600 PO), Take by mouth daily. , Disp: , Rfl:  .  EASY COMFORT PEN NEEDLES 31G X 8 MM MISC, AS DIRECTED FOUR TIMES DAILY, Disp: , Rfl:  .  fexofenadine (ALLEGRA) 60 MG tablet, Take by mouth., Disp: , Rfl:  .  gabapentin (NEURONTIN) 100 MG capsule, 2 capsules twice a day, 3 capsules at night, Disp: , Rfl:  .  HUMALOG KWIKPEN 200 UNIT/ML SOPN, Inject 2-5 Units into the skin 3 (three) times daily. If glucose is above 90 and she is eating, Disp: , Rfl: 3 .  IBU 800 MG tablet, Take 800 mg by mouth 3 (three) times daily as needed., Disp: , Rfl:  .  Lancets (ONETOUCH DELICA PLUS LANCET33G) MISC, Apply topically 4 (four) times daily., Disp: , Rfl:  .  levothyroxine (SYNTHROID, LEVOTHROID) 137 MCG tablet, Take 137 mcg by mouth daily before breakfast., Disp: , Rfl:  .  metFORMIN (GLUCOPHAGE) 500 MG tablet, Take 1 tablet (500 mg total) by mouth 2 (two) times  daily with a meal. 2 tabs bid, Disp: , Rfl:  .  nitroGLYCERIN (NITROSTAT) 0.4 MG SL tablet, Place 0.4 mg under the tongue every 5 (five) minutes as needed for chest pain., Disp: , Rfl:  .  ONETOUCH ULTRA test strip, 4 (four) times daily., Disp: , Rfl:  .  ticagrelor (BRILINTA) 90 MG TABS tablet, Take 90 mg by mouth 2 (two) times daily., Disp: , Rfl:  .  TRESIBA FLEXTOUCH 200 UNIT/ML SOPN, Inject 25 Units into the skin at bedtime. Taking in am, Disp: , Rfl: 3 .  valsartan (DIOVAN) 80 MG tablet, Take 80 mg by mouth daily., Disp: , Rfl:   Past Medical History: Past Medical History:  Diagnosis Date  . Arthritis   . COPD (chronic obstructive pulmonary disease) (HCC)   . Diabetes mellitus   . DVT (deep venous thrombosis) (HCC)    years ago  . GERD (gastroesophageal reflux disease)   . High cholesterol   . Hypertension   . Hypothyroidism   . Thyroid disease     Tobacco Use: Social History   Tobacco Use  Smoking Status Former Smoker  . Packs/day: 0.50  Smokeless Tobacco Never Used    Labs: Recent Review Flowsheet Data    Labs for ITP Cardiac and Pulmonary Rehab Latest Ref Rng & Units 03/12/2016 06/14/2016 09/20/2016 01/04/2017 04/11/2017   Hemoglobin A1c 4.8 -  5.6 % 7.6(H) 8.0(H) 7.7(H) 8.5(H) 9.6(H)      Capillary Blood Glucose: Lab Results  Component Value Date   GLUCAP 265 (H) 02/27/2021   GLUCAP 120 (H) 02/20/2021   GLUCAP 67 (L) 02/20/2021   GLUCAP 75 02/13/2021   GLUCAP 246 (H) 08/21/2019     Exercise Target Goals: Exercise Program Goal: Individual exercise prescription set using results from initial 6 min walk test and THRR while considering  patient's activity barriers and safety.   Exercise Prescription Goal: Starting with aerobic activity 30 plus minutes a day, 3 days per week for initial exercise prescription. Provide home exercise prescription and guidelines that participant acknowledges understanding prior to discharge.  Activity Barriers & Risk  Stratification:  Activity Barriers & Cardiac Risk Stratification - 02/10/21 1207      Activity Barriers & Cardiac Risk Stratification   Activity Barriers Balance Concerns;Deconditioning;Muscular Weakness;Joint Problems;Arthritis    Cardiac Risk Stratification High           6 Minute Walk:  6 Minute Walk    Row Name 02/10/21 1206         6 Minute Walk   Phase Initial     Distance 600 feet     Walk Time 6 minutes     # of Rest Breaks 0     MPH 1.14     METS 1.49     RPE 12     VO2 Peak 5.22     Symptoms No     Resting HR 77 bpm     Resting BP 124/80     Resting Oxygen Saturation  99 %     Exercise Oxygen Saturation  during 6 min walk 100 %     Max Ex. HR 80 bpm     Max Ex. BP 130/70     2 Minute Post BP 112/72            Oxygen Initial Assessment:   Oxygen Re-Evaluation:   Oxygen Discharge (Final Oxygen Re-Evaluation):   Initial Exercise Prescription:  Initial Exercise Prescription - 02/10/21 1200      Date of Initial Exercise RX and Referring Provider   Date 02/10/21    Referring Provider Dr. Sharrell Ku    Expected Discharge Date 05/05/21      NuStep   Level 1    SPM 60    Minutes 39      Prescription Details   Frequency (times per week) 3    Duration Progress to 30 minutes of continuous aerobic without signs/symptoms of physical distress      Intensity   THRR 40-80% of Max Heartrate 58-116    Ratings of Perceived Exertion 11-13    Perceived Dyspnea 0-4      Resistance Training   Training Prescription Yes    Weight 1 lb    Reps 10-15           Perform Capillary Blood Glucose checks as needed.  Exercise Prescription Changes:   Exercise Prescription Changes    Row Name 02/17/21 1324 03/06/21 1300           Response to Exercise   Blood Pressure (Admit) 114/70 100/60      Blood Pressure (Exercise) 118/66 130/64      Blood Pressure (Exit) 102/60 96/56      Heart Rate (Admit) 70 bpm 61 bpm      Heart Rate (Exercise) 108 bpm 92 bpm       Heart Rate (Exit) 79 bpm 70 bpm  Rating of Perceived Exertion (Exercise) 13 15      Duration Continue with 30 min of aerobic exercise without signs/symptoms of physical distress. Continue with 30 min of aerobic exercise without signs/symptoms of physical distress.      Intensity THRR unchanged THRR unchanged             Progression   Progression Continue to progress workloads to maintain intensity without signs/symptoms of physical distress. Continue to progress workloads to maintain intensity without signs/symptoms of physical distress.             Resistance Training   Training Prescription Yes Yes      Weight 1 lb 2 lbs      Reps 10-15 10-15      Time 10 Minutes 10 Minutes             NuStep   Level 1 1      SPM 80 94      Minutes 39 39      METs 1.7 2.1             Home Exercise Plan   Plans to continue exercise at -- Home (comment)      Frequency -- Add 3 additional days to program exercise sessions.      Initial Home Exercises Provided -- 03/06/21             Exercise Comments:   Exercise Comments    Row Name 02/15/21 1158 02/22/21 1130 03/06/21 1327       Exercise Comments Patient completed first exercise session today. She said that she really enjoyed exercising today and looks forward to coming back. She tolerated exercise well with no complaints. She has tried to come a couple of times but was unable to exercise due to high blood sugar. Her blood sugar was WNL to exercise today. Patient was unable to participate in exercise today due to her blood glucose levels being above limits to exercise safely. Her blood sugar was 309. Patient and I went over home exercise program today. She is currently exercising at home by waslking 15 min ever day she is not in rehab. We increased her walking time to 20 min            Exercise Goals and Review:   Exercise Goals    Row Name 02/10/21 1209 03/06/21 1323           Exercise Goals   Increase Physical Activity  Yes Yes      Intervention Provide advice, education, support and counseling about physical activity/exercise needs.;Develop an individualized exercise prescription for aerobic and resistive training based on initial evaluation findings, risk stratification, comorbidities and participant's personal goals. Provide advice, education, support and counseling about physical activity/exercise needs.;Develop an individualized exercise prescription for aerobic and resistive training based on initial evaluation findings, risk stratification, comorbidities and participant's personal goals.      Expected Outcomes Short Term: Attend rehab on a regular basis to increase amount of physical activity.;Long Term: Add in home exercise to make exercise part of routine and to increase amount of physical activity.;Long Term: Exercising regularly at least 3-5 days a week. Short Term: Attend rehab on a regular basis to increase amount of physical activity.;Long Term: Add in home exercise to make exercise part of routine and to increase amount of physical activity.;Long Term: Exercising regularly at least 3-5 days a week.      Increase Strength and Stamina Yes Yes      Intervention  Provide advice, education, support and counseling about physical activity/exercise needs.;Develop an individualized exercise prescription for aerobic and resistive training based on initial evaluation findings, risk stratification, comorbidities and participant's personal goals. Provide advice, education, support and counseling about physical activity/exercise needs.;Develop an individualized exercise prescription for aerobic and resistive training based on initial evaluation findings, risk stratification, comorbidities and participant's personal goals.      Expected Outcomes Short Term: Increase workloads from initial exercise prescription for resistance, speed, and METs.;Short Term: Perform resistance training exercises routinely during rehab and add in  resistance training at home;Long Term: Improve cardiorespiratory fitness, muscular endurance and strength as measured by increased METs and functional capacity ( ) Short Term: Increase workloads from initial exercise prescription for resistance, speed, and METs.;Short Term: Perform resistance training exercises routinely during rehab and add in resistance training at home;Long Term: Improve cardiorespiratory fitness, muscular endurance and strength as measured by increased METs and functional capacity ( )      Able to understand and use rate of perceived exertion (RPE) scale Yes Yes      Intervention Provide education and explanation on how to use RPE scale Provide education and explanation on how to use RPE scale      Expected Outcomes Short Term: Able to use RPE daily in rehab to express subjective intensity level;Long Term:  Able to use RPE to guide intensity level when exercising independently Short Term: Able to use RPE daily in rehab to express subjective intensity level;Long Term:  Able to use RPE to guide intensity level when exercising independently      Knowledge and understanding of Target Heart Rate Range (THRR) Yes Yes      Intervention Provide education and explanation of THRR including how the numbers were predicted and where they are located for reference Provide education and explanation of THRR including how the numbers were predicted and where they are located for reference      Expected Outcomes Short Term: Able to state/look up THRR;Long Term: Able to use THRR to govern intensity when exercising independently;Short Term: Able to use daily as guideline for intensity in rehab Short Term: Able to state/look up THRR;Long Term: Able to use THRR to govern intensity when exercising independently;Short Term: Able to use daily as guideline for intensity in rehab      Able to check pulse independently Yes Yes      Intervention Provide education and demonstration on how to check pulse in  carotid and radial arteries.;Review the importance of being able to check your own pulse for safety during independent exercise Provide education and demonstration on how to check pulse in carotid and radial arteries.;Review the importance of being able to check your own pulse for safety during independent exercise      Expected Outcomes Short Term: Able to explain why pulse checking is important during independent exercise;Long Term: Able to check pulse independently and accurately Short Term: Able to explain why pulse checking is important during independent exercise;Long Term: Able to check pulse independently and accurately      Understanding of Exercise Prescription Yes Yes      Intervention Provide education, explanation, and written materials on patient's individual exercise prescription Provide education, explanation, and written materials on patient's individual exercise prescription      Expected Outcomes Short Term: Able to explain program exercise prescription;Long Term: Able to explain home exercise prescription to exercise independently Short Term: Able to explain program exercise prescription;Long Term: Able to explain home exercise prescription to exercise independently  Exercise Goals Re-Evaluation :  Exercise Goals Re-Evaluation    Row Name 03/06/21 1323             Exercise Goal Re-Evaluation   Exercise Goals Review Increase Physical Activity;Increase Strength and Stamina;Able to understand and use rate of perceived exertion (RPE) scale;Knowledge and understanding of Target Heart Rate Range (THRR);Able to check pulse independently;Understanding of Exercise Prescription       Comments Patient has completed 5 exercise sessions. She has tolerated exercise well and has been ablt to progress a little bit each time. She has not been able to exercise regularly due to wither too low or too high blood glucose levels before exercise. She has had to be sent home a couple of  times for these reasons. She expresses her frustration with it and is now getting to rehab early in case complications arise with blood glucose levels, we have time to adjust before her class starts. She enjoys exercising and gets very happy when she is allowed to participate. She has started exercising at home walking 15 minutes every day. We increased the time walking to 20 minutes per day at home. She is currently exercising at 2.1 METs on the NuStep at rehab. Will continue to monitor and progress as able.       Expected Outcomes Through exercise at rehab and with a home exercise program, patient will achieve their goals.               Discharge Exercise Prescription (Final Exercise Prescription Changes):  Exercise Prescription Changes - 03/06/21 1300      Response to Exercise   Blood Pressure (Admit) 100/60    Blood Pressure (Exercise) 130/64    Blood Pressure (Exit) 96/56    Heart Rate (Admit) 61 bpm    Heart Rate (Exercise) 92 bpm    Heart Rate (Exit) 70 bpm    Rating of Perceived Exertion (Exercise) 15    Duration Continue with 30 min of aerobic exercise without signs/symptoms of physical distress.    Intensity THRR unchanged      Progression   Progression Continue to progress workloads to maintain intensity without signs/symptoms of physical distress.      Resistance Training   Training Prescription Yes    Weight 2 lbs    Reps 10-15    Time 10 Minutes      NuStep   Level 1    SPM 94    Minutes 39    METs 2.1      Home Exercise Plan   Plans to continue exercise at Home (comment)    Frequency Add 3 additional days to program exercise sessions.    Initial Home Exercises Provided 03/06/21           Nutrition:  Target Goals: Understanding of nutrition guidelines, daily intake of sodium 1500mg , cholesterol 200mg , calories 30% from fat and 7% or less from saturated fats, daily to have 5 or more servings of fruits and vegetables.  Biometrics:  Pre Biometrics -  03/06/21 1323      Pre Biometrics   Weight 61.9 kg    BMI (Calculated) 21.37            Nutrition Therapy Plan and Nutrition Goals:  Nutrition Therapy & Goals - 02/27/21 0744      Personal Nutrition Goals   Comments Patient's initial Medficts diet assessment score was 15. She says she trys to follow a diabetic diet. She is already meeting with an RD at Dr. Reuben Likes  office routinely. We will provide 2 educational handouts with discussion regarding a heart healthy diet.      Intervention Plan   Intervention Nutrition handout(s) given to patient.           Nutrition Assessments:  Nutrition Assessments - 02/09/21 1334      MEDFICTS Scores   Pre Score 15          MEDIFICTS Score Key:  ?70 Need to make dietary changes   40-70 Heart Healthy Diet  ? 40 Therapeutic Level Cholesterol Diet   Picture Your Plate Scores:  <57 Unhealthy dietary pattern with much room for improvement.  41-50 Dietary pattern unlikely to meet recommendations for good health and room for improvement.  51-60 More healthful dietary pattern, with some room for improvement.   >60 Healthy dietary pattern, although there may be some specific behaviors that could be improved.    Nutrition Goals Re-Evaluation:   Nutrition Goals Discharge (Final Nutrition Goals Re-Evaluation):   Psychosocial: Target Goals: Acknowledge presence or absence of significant depression and/or stress, maximize coping skills, provide positive support system. Participant is able to verbalize types and ability to use techniques and skills needed for reducing stress and depression.  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      Family Dynamics   Good Support System?  Yes    Comments Patient cares for her adult son who has bipolar disorder. He lives with her. She also has 2 other sons that live near her and she has several grandchildren. She says her son is very difficult to manage at times and he refuses medication for his disorder. Her other sons do help her out with him but she is the primary caregiver. She says she does feel depressed and her pcp has recommended antidepressants but she refuses to take. She does take Alprazolam BID for anxiety and to help her sleep. She says her depressed feelings are mainly related to her declining physically health and not being able to clean her house like she used to do and do other activities outside the home. She hopes the program will help her get some of her strength back to be able to do the activities she wants to do.      Barriers   Psychosocial barriers to participate in program Psychosocial barriers identified (see note)      Screening Interventions   Interventions Provide feedback about the scores to participant;Encouraged to exercise    Expected Outcomes Short Term goal: Identification and review with participant of any Quality of Life or Depression concerns found by scoring the questionnaire.;Long Term goal: The participant improves quality of Life and PHQ9 Scores as seen by post scores and/or verbalization of changes           Quality of Life Scores:  Quality of Life - 02/09/21 1559      Quality of Life   Select Quality of Life      Quality of Life Scores   Health/Function Pre 17.43 %    Socioeconomic Pre 20.83 %    Psych/Spiritual Pre 12.71 %    Family Pre 19.88 %    GLOBAL Pre 17.34 %          Scores of 19  and below usually indicate a poorer quality of life in these areas.  A difference of  2-3 points is a clinically meaningful difference.  A difference of 2-3 points in the total score of the Quality of Life Index has been associated with significant improvement in overall quality of life,  self-image, physical symptoms, and general health in studies assessing change in quality of life.  PHQ-9: Recent Review Flowsheet Data    Depression screen [facility] 2/9 02/09/2021 04/18/2017 01/15/2017 09/27/2016 03/19/2016   Decreased Interest 1 0 0 0 0   Down, Depressed, Hopeless 3 0 0 0 0   PHQ - 2 Score 4 0 0 0 0   Altered sleeping 0 - - - -   Tired, decreased energy 0 - - - -   Change in appetite 0 - - - -   Feeling bad or failure about yourself  3 - - - -   Trouble concentrating 3 - - - -   Moving slowly or fidgety/restless 0 - - - -   Suicidal thoughts 0 - - - -   PHQ-9 Score 10 - - - -     Interpretation of Total Score  Total Score Depression Severity:  1-4 = Minimal depression, 5-9 = Mild depression, 10-14 = Moderate depression, 15-19 = Moderately severe depression, 20-27 = Severe depression   Psychosocial Evaluation and Intervention:  Psychosocial Evaluation - 02/10/21 0742      Psychosocial Evaluation & Interventions   Interventions Stress management education;Relaxation education    Comments Patient's initial QOL score was 17.34 overall scoring lowest in Pscho/Spiritual. Her PHQ-9 score was 8. She does have depressed feelings related to her declining health and she uses Alprazolam for anxiety and to help her sleep. She does identify her adult son with bipolar disorder as a stressor in her life.    Expected Outcomes Patient will continue to have her anxiety managed and her depressed feelings will improve with her strength improving as she goes through the program.    Continue Psychosocial Services  No Follow up required           Psychosocial Re-Evaluation:  Psychosocial Re-Evaluation    Row Name 02/27/21 0746             Psychosocial Re-Evaluation   Current issues with Current Depression;Current Anxiety/Panic       Comments Patient is new to the program completing 4 sessions. She is doing well so far but has been frustrated due to not being able to exercise 2 times due  to glucose control. Her anxiety continues to be managed well with Alprazolam. 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 She continues to has a positive outlook and hopes to be able to complete the program with better controlled DM. Will continue to montior.       Expected Outcomes Patient's psychosocial barriers will continue to be managed with no additional barriers identified.       Interventions Stress management education;Relaxation education;Encouraged to attend Cardiac Rehabilitation for the exercise       Continue Psychosocial Services  No Follow up required              Psychosocial Discharge (Final Psychosocial Re-Evaluation):  Psychosocial Re-Evaluation - 02/27/21 0746      Psychosocial Re-Evaluation   Current issues with Current Depression;Current Anxiety/Panic    Comments Patient is new to the program completing 4 sessions. She is doing  well so far but has been frustrated due to not being able to exercise 2 times due to glucose control. Her anxiety continues to be managed well with Alprazolam. 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 She continues to has a positive outlook and hopes to be able to complete the program with better controlled DM. Will continue to montior.    Expected Outcomes Patient's psychosocial barriers will continue to be managed with no additional barriers identified.    Interventions Stress management education;Relaxation education;Encouraged to attend Cardiac Rehabilitation for the exercise    Continue Psychosocial Services  No Follow up required           Vocational Rehabilitation: Provide vocational rehab assistance to qualifying candidates.   Vocational Rehab Evaluation & Intervention:  Vocational Rehab - 02/09/21 1335      Initial Vocational Rehab Evaluation & Intervention   Assessment shows need for Vocational  Rehabilitation No      Vocational Rehab Re-Evaulation   Comments Patient is retired and is not interested in returning to work. She does not need vocational rehab.           Education: Education Goals: Education classes will be provided on a weekly basis, covering required topics. Participant will state understanding/return demonstration of topics presented.  Learning Barriers/Preferences:  Learning Barriers/Preferences - 02/09/21 1335      Learning Barriers/Preferences   Learning Barriers Hearing    Learning Preferences Audio           Education Topics: Hypertension, Hypertension Reduction -Define heart disease and high blood pressure. Discus how high blood pressure affects the body and ways to reduce high blood pressure.   Exercise and Your Heart -Discuss why it is important to exercise, the FITT principles of exercise, normal and abnormal responses to exercise, and how to exercise safely.   Angina -Discuss definition of angina, causes of angina, treatment of angina, and how to decrease risk of having angina.   Cardiac Medications -Review what the following cardiac medications are used for, how they affect the body, and side effects that may occur when taking the medications.  Medications include Aspirin, Beta blockers, calcium channel blockers, ACE Inhibitors, angiotensin receptor blockers, diuretics, digoxin, and antihyperlipidemics.   Congestive Heart Failure -Discuss the definition of CHF, how to live with CHF, the signs and symptoms of CHF, and how keep track of weight and sodium intake.   Heart Disease and Intimacy -Discus the effect sexual activity has on the heart, how changes occur during intimacy as we age, and safety during sexual activity.   Smoking Cessation / COPD -Discuss different methods to quit smoking, the health benefits of quitting smoking, and the definition of COPD.   Nutrition I: Fats -Discuss the types of cholesterol, what cholesterol  does to the heart, and how cholesterol levels can be controlled.   Nutrition II: Labels -Discuss the different components of food labels and how to read food label   Heart Parts/Heart Disease and PAD -Discuss the anatomy of the heart, the pathway of blood circulation through the heart, and these are affected by heart disease.   Stress I: Signs and Symptoms -Discuss the causes of stress, how stress may lead to anxiety and depression, and ways to limit stress. Flowsheet Row CARDIAC REHAB PHASE II EXERCISE from [facility]15/2021 [facility]  Date 02/15/21  Educator mk  Instruction Review Code 2- Demonstrated Understanding  Stress II: Relaxation -Discuss different types of relaxation techniques to limit stress.   Warning Signs of Stroke / TIA -Discuss definition of a stroke, what the signs and symptoms are of a stroke, and how to identify when someone is having stroke.   Knowledge Questionnaire Score:  Knowledge Questionnaire Score - 02/09/21 1335      Knowledge Questionnaire Score   Pre Score 17/24           Core Components/Risk Factors/Patient Goals at Admission:  Personal Goals and Risk Factors at Admission - 02/09/21 1335      Core Components/Risk Factors/Patient Goals on Admission    Weight Management Weight Gain    Diabetes Yes    Intervention Provide education about signs/symptoms and action to take for hypo/hyperglycemia.;Provide education about proper nutrition, including hydration, and aerobic/resistive exercise prescription along with prescribed medications to achieve blood glucose in normal ranges: Fasting glucose 65-99 mg/dL    Expected Outcomes Short Term: Participant verbalizes understanding of the signs/symptoms and immediate care of hyper/hypoglycemia, proper foot care and importance of medication, aerobic/resistive exercise and nutrition plan for blood glucose control.    Stress Yes    Intervention Offer individual and/or small group  education and counseling on adjustment to heart disease, stress management and health-related lifestyle change. Teach and support self-help strategies.    Expected Outcomes Short Term: Participant demonstrates changes in health-related behavior, relaxation and other stress management skills, ability to obtain effective social support, and compliance with psychotropic medications if prescribed.    Personal Goal Other Yes    Personal Goal Get stronger and be able to return to her ADL's.    Intervention Provide cardiac rehab 3 days/week and guided exercise prescription 2 days/week at home.    Expected Outcomes Patient will complete the program meeting both program and personal goals.           Core Components/Risk Factors/Patient Goals Review:   Goals and Risk Factor Review    Row Name 02/27/21 0740             Core Components/Risk Factors/Patient Goals Review   Personal Goals Review Diabetes;Stress;Other       Review Patient is new to the program completing 4 sessions. She was referred to CR wtih NSTEMI and Stent placement. She has multiple risk factors for CAD and is participating in the program for risk modification. She has not been able to exercise a few days due to hypoglycemia or hyperglycemia. Her DM is managed by an endrocrinologist whom she saw 02/21/21. He decreased her Tresiba to 25U and decreased her Meftormin from 1000 to 500 mg daily. Her personal goals for the program are to be able to return to her ADL's and get stronger. We will continue to monitor her progress as she works toward meeting these goals.       Expected Outcomes Patient will complete the program meeting both program and personal goals.              Core Components/Risk Factors/Patient Goals at Discharge (Final Review):   Goals and Risk Factor Review - 02/27/21 0740      Core Components/Risk Factors/Patient Goals Review   Personal Goals Review Diabetes;Stress;Other    Review Patient is new to the program  completing 4 sessions. She was referred to CR wtih NSTEMI and Stent placement. She has multiple risk factors for CAD and is participating in the program for risk modification. She has not been able to exercise a few days due to hypoglycemia or  hyperglycemia. Her DM is managed by an endrocrinologist whom she saw 02/21/21. He decreased her Tresiba to 25U and decreased her Meftormin from 1000 to 500 mg daily. Her personal goals for the program are to be able to return to her ADL's and get stronger. We will continue to monitor her progress as she works toward meeting these goals.    Expected Outcomes Patient will complete the program meeting both program and personal goals.           ITP Comments:   Comments: ITP REVIEW Pt is making expected progress toward Cardiac Rehab goals after completing 4 sessions. Recommend continued exercise, life style modification, education, and increased stamina and strength.

## 2021-03-08 NOTE — Progress Notes (Signed)
Daily Session Note  Patient Details  Name: Gabrielle Wood MRN: [id number] Date of Birth: March 28, 1946 Referring Provider:   Flowsheet Row CARDIAC REHAB PHASE II ORIENTATION from 02/10/2021 in [place]  Referring Provider Dr. Candis Musa      Encounter Date: 03/08/2021  Check In:  Session Check In - 03/08/21 1100      Check-In   Supervising physician immediately available to respond to emergencies [facility] MD immediately available    Physician(s) Dr. Harl Bowie    Location [facility]    Staff Present Aundra Dubin, RN, BSN;Yesennia Hirota Audria Nine, MS, Exercise Physiologist    Virtual Visit No    Medication changes reported     No    Fall or balance concerns reported    No    Tobacco Cessation No Change    Warm-up and Cool-down Performed as group-led instruction    Resistance Training Performed Yes    VAD Patient? No    PAD/SET Patient? No      Pain Assessment   Currently in Pain? No/denies    Pain Score 0-No pain    Multiple Pain Sites No           Capillary Blood Glucose: No results found for this or any previous visit (from the past 24 hour(s)).    Social History   Tobacco Use  Smoking Status Former Smoker  . Packs/day: 0.50  Smokeless Tobacco Never Used    Goals Met:  Independence with exercise equipment Exercise tolerated well No report of cardiac concerns or symptoms Strength training completed today  Goals Unmet:  Not Applicable  Comments: check out 1200   Dr. Kathie Dike is Medical Director for [facility].

## 2021-03-09 ENCOUNTER — Ambulatory Visit: Payer: Medicare Other | Admitting: Nutrition

## 2021-03-10 ENCOUNTER — Other Ambulatory Visit: Payer: Self-pay

## 2021-03-10 ENCOUNTER — Encounter (HOSPITAL_COMMUNITY)
Admission: RE | Admit: 2021-03-10 | Discharge: 2021-03-10 | Disposition: A | Discharge status: Home / Self Care | Payer: Medicare Other | Source: Ambulatory Visit | Attending: Internal Medicine | Admitting: Internal Medicine

## 2021-03-10 DIAGNOSIS — I214 Non-ST elevation (NSTEMI) myocardial infarction: Secondary | ICD-10-CM | POA: Diagnosis not present

## 2021-03-10 DIAGNOSIS — Z955 Presence of coronary angioplasty implant and graft: Secondary | ICD-10-CM | POA: Diagnosis not present

## 2021-03-10 NOTE — Progress Notes (Signed)
Daily Session Note  Patient Details  Name: Gabrielle Wood MRN: [id number] Date of Birth: 05/15/1946 Referring Provider:   Flowsheet Row CARDIAC REHAB PHASE II ORIENTATION from 02/10/2021 in [place]  Referring Provider Dr. Candis Musa      Encounter Date: 03/10/2021  Check In:  Session Check In - 03/10/21 1100      Check-In   Supervising physician immediately available to respond to emergencies [facility] MD immediately available    Physician(s) Dr. Domenic Polite    Location [facility]    Staff Present Hoy Register, MS, ACSM-CEP, Exercise Physiologist;Debra Wynetta Emery, RN, BSN    Virtual Visit No    Medication changes reported     No    Fall or balance concerns reported    No    Tobacco Cessation No Change    Warm-up and Cool-down Performed as group-led instruction    Resistance Training Performed Yes    VAD Patient? No    PAD/SET Patient? No      Pain Assessment   Currently in Pain? No/denies    Pain Score 0-No pain    Multiple Pain Sites No           Capillary Blood Glucose: No results found for this or any previous visit (from the past 24 hour(s)).    Social History   Tobacco Use  Smoking Status Former Smoker  . Packs/day: 0.50  Smokeless Tobacco Never Used    Goals Met:  Independence with exercise equipment Exercise tolerated well No report of cardiac concerns or symptoms Strength training completed today  Goals Unmet:  Not Applicable  Comments: checkout time is 1200   Dr. Kathie Dike is Medical Director for [facility] Pul[facility] Re[facility].

## 2021-03-13 ENCOUNTER — Other Ambulatory Visit: Payer: Self-pay

## 2021-03-13 ENCOUNTER — Encounter (HOSPITAL_COMMUNITY)
Admission: RE | Admit: 2021-03-13 | Discharge: 2021-03-13 | Disposition: A | Discharge status: Home / Self Care | Payer: Medicare Other | Source: Ambulatory Visit | Attending: Internal Medicine | Admitting: Internal Medicine

## 2021-03-13 DIAGNOSIS — I214 Non-ST elevation (NSTEMI) myocardial infarction: Secondary | ICD-10-CM | POA: Diagnosis not present

## 2021-03-13 DIAGNOSIS — Z955 Presence of coronary angioplasty implant and graft: Secondary | ICD-10-CM

## 2021-03-13 NOTE — Progress Notes (Signed)
Daily Session Note  Patient Details  Name: Gabrielle Wood MRN: [id number] Date of Birth: 02/19/1946 Referring Provider:   Flowsheet Row CARDIAC REHAB PHASE II ORIENTATION from 02/10/2021 in [place] [facility]  Referring Provider Dr. Assar      Encounter Date: 03/13/2021  Check In:  Session Check In - 03/13/21 1100      Check-In   Supervising physician immediately available to respond to emergencies [facility] MD immediately available    Physician(s) Dr. Nishan    Location [facility]    Staff Present Madison Karch, MS, Exercise Physiologist; , MS, ACSM-CEP, Exercise Physiologist    Virtual Visit No    Medication changes reported     No    Fall or balance concerns reported    No    Tobacco Cessation No Change    Warm-up and Cool-down Performed as group-led instruction    Resistance Training Performed Yes    VAD Patient? No    PAD/SET Patient? No      Pain Assessment   Currently in Pain? No/denies    Pain Score 0-No pain    Multiple Pain Sites No           Capillary Blood Glucose: No results found for this or any previous visit (from the past 24 hour(s)).    Social History   Tobacco Use  Smoking Status Former Smoker  . Packs/day: 0.50  Smokeless Tobacco Never Used    Goals Met:  Independence with exercise equipment Exercise tolerated well Personal goals reviewed Strength training completed today  Goals Unmet:  Not Applicable  Comments: checkout time is 1200   Dr. Jehanzeb Memon is Medical Director for [place] [facility]. 

## 2021-03-15 ENCOUNTER — Other Ambulatory Visit: Payer: Self-pay

## 2021-03-15 ENCOUNTER — Encounter (HOSPITAL_COMMUNITY)
Admission: RE | Admit: 2021-03-15 | Discharge: 2021-03-15 | Disposition: A | Discharge status: Home / Self Care | Payer: Medicare Other | Source: Ambulatory Visit | Attending: Internal Medicine | Admitting: Internal Medicine

## 2021-03-15 DIAGNOSIS — Z955 Presence of coronary angioplasty implant and graft: Secondary | ICD-10-CM

## 2021-03-15 DIAGNOSIS — I214 Non-ST elevation (NSTEMI) myocardial infarction: Secondary | ICD-10-CM

## 2021-03-15 NOTE — Progress Notes (Signed)
Daily Session Note  Patient Details  Name: Shaneta J Wollschlager MRN: [id number] Date of Birth: 02/18/1946 Referring Provider:   Flowsheet Row CARDIAC REHAB PHASE II ORIENTATION from 02/10/2021 in [place] [facility]  Referring Provider Dr. Assar      Encounter Date: 03/15/2021  Check In:  Session Check In - 03/15/21 1100      Check-In   Supervising physician immediately available to respond to emergencies [facility] MD immediately available    Physician(s) Dr. Nishan    Location [facility]    Staff Present  , MS, Exercise Physiologist;Debra Johnson, RN, BSN    Virtual Visit No    Medication changes reported     No    Fall or balance concerns reported    No    Tobacco Cessation No Change    Warm-up and Cool-down Performed as group-led instruction    Resistance Training Performed Yes    VAD Patient? No    PAD/SET Patient? No      Pain Assessment   Currently in Pain? No/denies    Pain Score 0-No pain    Multiple Pain Sites No           Capillary Blood Glucose: No results found for this or any previous visit (from the past 24 hour(s)).    Social History   Tobacco Use  Smoking Status Former Smoker  . Packs/day: 0.50  Smokeless Tobacco Never Used    Goals Met:  Independence with exercise equipment Exercise tolerated well No report of cardiac concerns or symptoms Strength training completed today  Goals Unmet:  Not Applicable  Comments: check out 1200   Dr. Jehanzeb Memon is Medical Director for [place] [facility]. 

## 2021-03-17 ENCOUNTER — Other Ambulatory Visit: Payer: Self-pay

## 2021-03-17 ENCOUNTER — Encounter (HOSPITAL_COMMUNITY)
Admission: RE | Admit: 2021-03-17 | Discharge: 2021-03-17 | Disposition: A | Discharge status: Home / Self Care | Payer: Medicare Other | Source: Ambulatory Visit | Attending: Internal Medicine | Admitting: Internal Medicine

## 2021-03-17 DIAGNOSIS — I214 Non-ST elevation (NSTEMI) myocardial infarction: Secondary | ICD-10-CM

## 2021-03-17 DIAGNOSIS — Z955 Presence of coronary angioplasty implant and graft: Secondary | ICD-10-CM

## 2021-03-17 NOTE — Progress Notes (Signed)
Daily Session Note  Patient Details  Name: Gabrielle Wood MRN: [id number] Date of Birth: September 23, 1946 Referring Provider:   Flowsheet Row CARDIAC REHAB PHASE II ORIENTATION from 02/10/2021 in [place]  Referring Provider Dr. Candis Musa      Encounter Date: 03/17/2021  Check In:  Session Check In - 03/17/21 1100      Check-In   Supervising physician immediately available to respond to emergencies [facility] MD immediately available    Physician(s) Dr. Harrington Challenger    Location [facility]    Staff Present Cathren Harsh, MS, Exercise Physiologist;Dalton Kris Mouton, MS, ACSM-CEP, Exercise Physiologist    Virtual Visit No    Medication changes reported     No    Fall or balance concerns reported    No    Tobacco Cessation No Change    Warm-up and Cool-down Performed as group-led instruction    Resistance Training Performed Yes    VAD Patient? No    PAD/SET Patient? No      Pain Assessment   Currently in Pain? No/denies    Pain Score 0-No pain    Multiple Pain Sites No           Capillary Blood Glucose: No results found for this or any previous visit (from the past 24 hour(s)).    Social History   Tobacco Use  Smoking Status Former Smoker  . Packs/day: 0.50  Smokeless Tobacco Never Used    Goals Met:  Independence with exercise equipment Exercise tolerated well No report of cardiac concerns or symptoms Strength training completed today  Goals Unmet:  Not Applicable  Comments: check out 1200   Dr. Kathie Dike is Medical Director for [facility] Pul[facility] Re[facility].

## 2021-03-20 ENCOUNTER — Encounter (HOSPITAL_COMMUNITY)
Admission: RE | Admit: 2021-03-20 | Discharge: 2021-03-20 | Disposition: A | Discharge status: Home / Self Care | Payer: Medicare Other | Source: Ambulatory Visit | Attending: Internal Medicine | Admitting: Internal Medicine

## 2021-03-20 ENCOUNTER — Other Ambulatory Visit: Payer: Self-pay

## 2021-03-20 VITALS — Wt 138.2 lb

## 2021-03-20 DIAGNOSIS — I214 Non-ST elevation (NSTEMI) myocardial infarction: Secondary | ICD-10-CM

## 2021-03-20 DIAGNOSIS — Z955 Presence of coronary angioplasty implant and graft: Secondary | ICD-10-CM | POA: Diagnosis not present

## 2021-03-20 DIAGNOSIS — Z23 Encounter for immunization: Secondary | ICD-10-CM | POA: Diagnosis not present

## 2021-03-20 NOTE — Progress Notes (Signed)
Daily Session Note  Patient Details  Name: Gabrielle Wood MRN: [id number] Date of Birth: February 10, 1946 Referring Provider:   Flowsheet Row CARDIAC REHAB PHASE II ORIENTATION from 02/10/2021 in [place]  Referring Provider Dr. Candis Musa      Encounter Date: 03/20/2021  Check In:  Session Check In - 03/20/21 1100      Check-In   Supervising physician immediately available to respond to emergencies [facility] MD immediately available    Physician(s) Dr. Harl Bowie    Location [facility]    Staff Present Cathren Harsh, MS, Exercise Physiologist;Dalton Kris Mouton, MS, ACSM-CEP, Exercise Physiologist    Virtual Visit No    Medication changes reported     No    Fall or balance concerns reported    No    Tobacco Cessation No Change    Warm-up and Cool-down Performed as group-led instruction    Resistance Training Performed Yes    VAD Patient? No    PAD/SET Patient? No      Pain Assessment   Currently in Pain? No/denies    Pain Score 0-No pain    Multiple Pain Sites No           Capillary Blood Glucose: No results found for this or any previous visit (from the past 24 hour(s)).    Social History   Tobacco Use  Smoking Status Former Smoker  . Packs/day: 0.50  Smokeless Tobacco Never Used    Goals Met:  Independence with exercise equipment Exercise tolerated well No report of cardiac concerns or symptoms Strength training completed today  Goals Unmet:  Not Applicable  Comments: check out 1200   Dr. Kathie Dike is Medical Director for [facility] Pulmonary Rehab.

## 2021-03-22 ENCOUNTER — Other Ambulatory Visit: Payer: Self-pay

## 2021-03-22 ENCOUNTER — Encounter (HOSPITAL_COMMUNITY)
Admission: RE | Admit: 2021-03-22 | Discharge: 2021-03-22 | Disposition: A | Discharge status: Home / Self Care | Payer: Medicare Other | Source: Ambulatory Visit | Attending: Internal Medicine | Admitting: Internal Medicine

## 2021-03-22 DIAGNOSIS — Z955 Presence of coronary angioplasty implant and graft: Secondary | ICD-10-CM | POA: Diagnosis not present

## 2021-03-22 DIAGNOSIS — I214 Non-ST elevation (NSTEMI) myocardial infarction: Secondary | ICD-10-CM | POA: Diagnosis not present

## 2021-03-22 NOTE — Progress Notes (Signed)
Daily Session Note  Patient Details  Name: Gabrielle Wood MRN: [id number] Date of Birth: 01/12/1946 Referring Provider:   Flowsheet Row CARDIAC REHAB PHASE II ORIENTATION from 02/10/2021 in [place]  Referring Provider Dr. Candis Musa      Encounter Date: 03/22/2021  Check In:  Session Check In - 03/22/21 1100      Check-In   Supervising physician immediately available to respond to emergencies [facility] MD immediately available    Physician(s) Dr. Harl Bowie    Location [facility]    Staff Present Cathren Harsh, MS, Exercise Physiologist;Dalton Kris Mouton, MS, ACSM-CEP, Exercise Physiologist    Virtual Visit No    Medication changes reported     No    Fall or balance concerns reported    No    Tobacco Cessation No Change    Warm-up and Cool-down Performed as group-led instruction    Resistance Training Performed Yes    VAD Patient? No    PAD/SET Patient? No      Pain Assessment   Currently in Pain? No/denies    Pain Score 0-No pain    Multiple Pain Sites No           Capillary Blood Glucose: No results found for this or any previous visit (from the past 24 hour(s)).    Social History   Tobacco Use  Smoking Status Former Smoker  . Packs/day: 0.50  Smokeless Tobacco Never Used    Goals Met:  Independence with exercise equipment Exercise tolerated well No report of cardiac concerns or symptoms Strength training completed today  Goals Unmet:  Not Applicable  Comments: check out 1600   Dr. Kathie Dike is Medical Director for [facility] Pulmonary Re[facility].

## 2021-03-24 ENCOUNTER — Other Ambulatory Visit: Payer: Self-pay

## 2021-03-24 ENCOUNTER — Encounter (HOSPITAL_COMMUNITY)
Admission: RE | Admit: 2021-03-24 | Discharge: 2021-03-24 | Disposition: A | Discharge status: Home / Self Care | Payer: Medicare Other | Source: Ambulatory Visit | Attending: Internal Medicine | Admitting: Internal Medicine

## 2021-03-24 DIAGNOSIS — Z955 Presence of coronary angioplasty implant and graft: Secondary | ICD-10-CM | POA: Diagnosis not present

## 2021-03-24 DIAGNOSIS — I214 Non-ST elevation (NSTEMI) myocardial infarction: Secondary | ICD-10-CM

## 2021-03-24 NOTE — Progress Notes (Signed)
Daily Session Note  Patient Details  Name: Gabrielle Wood MRN: [id number] Date of Birth: 11/07/1946 Referring Provider:   Flowsheet Row CARDIAC REHAB PHASE II ORIENTATION from 02/10/2021 in [place] [facility]  Referring Provider Dr. Assar      Encounter Date: 03/24/2021  Check In:  Session Check In - 03/24/21 1100      Check-In   Supervising physician immediately available to respond to emergencies [facility] MD immediately available    Physician(s) Dr. McDowell    Location [facility]    Staff Present Debra Johnson, RN, BSN; , MS, Exercise Physiologist    Virtual Visit No    Medication changes reported     No    Fall or balance concerns reported    No    Tobacco Cessation No Change    Warm-up and Cool-down Performed as group-led instruction    Resistance Training Performed Yes    VAD Patient? No    PAD/SET Patient? No      Pain Assessment   Currently in Pain? No/denies    Pain Score 0-No pain    Multiple Pain Sites No           Capillary Blood Glucose: No results found for this or any previous visit (from the past 24 hour(s)).    Social History   Tobacco Use  Smoking Status Former Smoker  . Packs/day: 0.50  Smokeless Tobacco Never Used    Goals Met:  Independence with exercise equipment Exercise tolerated well No report of cardiac concerns or symptoms Strength training completed today  Goals Unmet:  Not Applicable  Comments: check out 1200   Dr. Jehanzeb Memon is Medical Director for [place] [facility]. 

## 2021-03-27 ENCOUNTER — Encounter (HOSPITAL_COMMUNITY): Payer: Medicare Other

## 2021-03-29 ENCOUNTER — Other Ambulatory Visit: Payer: Self-pay

## 2021-03-29 ENCOUNTER — Encounter (HOSPITAL_COMMUNITY)
Admission: RE | Admit: 2021-03-29 | Discharge: 2021-03-29 | Disposition: A | Discharge status: Home / Self Care | Payer: Medicare Other | Source: Ambulatory Visit | Attending: Internal Medicine | Admitting: Internal Medicine

## 2021-03-29 DIAGNOSIS — Z955 Presence of coronary angioplasty implant and graft: Secondary | ICD-10-CM

## 2021-03-29 DIAGNOSIS — I214 Non-ST elevation (NSTEMI) myocardial infarction: Secondary | ICD-10-CM

## 2021-03-29 NOTE — Progress Notes (Signed)
Daily Session Note  Patient Details  Name: Gabrielle Wood MRN: [id number] Date of Birth: [birth year] Referring Provider:   Flowsheet Row CARDIAC REHAB PHASE II ORIENTATION from 02/10/2021 in [place]  Referring Provider Dr. Candis Musa      Encounter Date: 03/29/2021  Check In:  Session Check In - 03/29/21 1100      Check-In   Supervising physician immediately available to respond to emergencies [facility] MD immediately available    Physician(s) Dr. Domenic Polite    Location [facility]    Staff Present Cathren Harsh, MS, Exercise Physiologist;Debra Wynetta Emery, RN, BSN    Virtual Visit No    Medication changes reported     No    Fall or balance concerns reported    No    Tobacco Cessation No Change    Warm-up and Cool-down Performed as group-led instruction    Resistance Training Performed Yes    VAD Patient? No    PAD/SET Patient? No      Pain Assessment   Currently in Pain? No/denies    Pain Score 0-No pain    Multiple Pain Sites No           Capillary Blood Glucose: No results found for this or any previous visit (from the past 24 hour(s)).    Social History   Tobacco Use  Smoking Status Former Smoker  . Packs/day: 0.50  Smokeless Tobacco Never Used    Goals Met:  Independence with exercise equipment Exercise tolerated well No report of cardiac concerns or symptoms Strength training completed today  Goals Unmet:  Not Applicable  Comments: checkout 1200   Dr. Kathie Dike is Medical Director for [facility] Pulmonary Rehab.

## 2021-03-31 ENCOUNTER — Other Ambulatory Visit: Payer: Self-pay

## 2021-03-31 ENCOUNTER — Encounter (HOSPITAL_COMMUNITY)
Admission: RE | Admit: 2021-03-31 | Discharge: 2021-03-31 | Disposition: A | Discharge status: Home / Self Care | Payer: Medicare Other | Source: Ambulatory Visit | Attending: Internal Medicine | Admitting: Internal Medicine

## 2021-03-31 DIAGNOSIS — Z955 Presence of coronary angioplasty implant and graft: Secondary | ICD-10-CM | POA: Diagnosis not present

## 2021-03-31 DIAGNOSIS — I214 Non-ST elevation (NSTEMI) myocardial infarction: Secondary | ICD-10-CM

## 2021-03-31 NOTE — Progress Notes (Signed)
Daily Session Note  Patient Details  Name: Gabrielle Wood MRN: [id number] Date of Birth: 04/21/1946 Referring Provider:   Flowsheet Row CARDIAC REHAB PHASE II ORIENTATION from 02/10/2021 in [place]  Referring Provider Dr. Candis Musa      Encounter Date: 03/31/2021  Check In:  Session Check In - 03/31/21 1100      Check-In   Supervising physician immediately available to respond to emergencies [facility] MD immediately available    Physician(s) Dr. Domenic Polite    Location [facility]    Staff Present Cathren Harsh, MS, Exercise Physiologist;Kimon Loewen Kris Mouton, MS, ACSM-CEP, Exercise Physiologist    Virtual Visit No    Medication changes reported     No    Fall or balance concerns reported    No    Tobacco Cessation No Change    Warm-up and Cool-down Performed as group-led instruction    Resistance Training Performed Yes    VAD Patient? No    PAD/SET Patient? No      Pain Assessment   Currently in Pain? No/denies    Pain Score 0-No pain    Multiple Pain Sites No           Capillary Blood Glucose: No results found for this or any previous visit (from the past 24 hour(s)).    Social History   Tobacco Use  Smoking Status Former Smoker  . Packs/day: 0.50  Smokeless Tobacco Never Used    Goals Met:  Independence with exercise equipment Exercise tolerated well No report of cardiac concerns or symptoms Strength training completed today  Goals Unmet:  Not Applicable  Comments: checkout time is 1200   Dr. Kathie Dike is Medical Director for [facility] Pulmonary Rehab.

## 2021-04-03 ENCOUNTER — Encounter (HOSPITAL_COMMUNITY): Payer: Medicare Other

## 2021-04-05 ENCOUNTER — Encounter (HOSPITAL_COMMUNITY)
Admission: RE | Admit: 2021-04-05 | Discharge: 2021-04-05 | Disposition: A | Discharge status: Home / Self Care | Payer: Medicare Other | Source: Ambulatory Visit | Attending: Internal Medicine | Admitting: Internal Medicine

## 2021-04-05 ENCOUNTER — Other Ambulatory Visit: Payer: Self-pay

## 2021-04-05 DIAGNOSIS — I214 Non-ST elevation (NSTEMI) myocardial infarction: Secondary | ICD-10-CM | POA: Diagnosis not present

## 2021-04-05 DIAGNOSIS — Z955 Presence of coronary angioplasty implant and graft: Secondary | ICD-10-CM | POA: Diagnosis not present

## 2021-04-05 NOTE — Progress Notes (Signed)
Daily Session Note  Patient Details  Name: Gabrielle Wood MRN: [id number] Date of Birth: [birth year] Referring Provider:   Flowsheet Row CARDIAC REHAB PHASE II ORIENTATION from 02/10/2021 in [place]  Referring Provider Dr. Candis Musa      Encounter Date: 04/05/2021  Check In:  Session Check In - 04/05/21 1100      Check-In   Supervising physician immediately available to respond to emergencies [facility] MD immediately available    Physician(s) Dr. Harl Bowie    Location [facility]    Staff Present Aundra Dubin, RN, BSN;Chane Cowden Audria Nine, MS, Exercise Physiologist    Virtual Visit No    Medication changes reported     No    Fall or balance concerns reported    No    Tobacco Cessation No Change    Warm-up and Cool-down Performed as group-led instruction    Resistance Training Performed Yes    VAD Patient? No    PAD/SET Patient? No      Pain Assessment   Currently in Pain? No/denies    Pain Score 0-No pain    Multiple Pain Sites No           Capillary Blood Glucose: No results found for this or any previous visit (from the past 24 hour(s)).    Social History   Tobacco Use  Smoking Status Former Smoker  . Packs/day: 0.50  Smokeless Tobacco Never Used    Goals Met:  Independence with exercise equipment Exercise tolerated well No report of cardiac concerns or symptoms Strength training completed today  Goals Unmet:  Not Applicable  Comments: check out 1200   Dr. Kathie Dike is Medical Director for [facility] Pul[facility] Re[facility].

## 2021-04-05 NOTE — Progress Notes (Signed)
Cardiac Individual Treatment Plan  Patient Details  Name: Gabrielle Wood MRN: [id number] Date of Birth: 1946/11/23 Referring Provider:   Flowsheet Row CARDIAC REHAB PHASE II ORIENTATION from 02/10/2021 in [facility] CARDIAC REHABILITATION  Referring Provider Dr. Sharrell Ku      Initial Encounter Date:  Flowsheet Row CARDIAC REHAB PHASE II ORIENTATION from 02/10/2021 in [place] CARDIAC REHABILITATION  Date 02/10/21      Visit Diagnosis: NSTEMI (non-ST elevated myocardial infarction) [facility])  S/P coronary artery stent placement  Patient's Home Medications on Admission:  Current Outpatient Medications:  .  ALPRAZolam (XANAX) 0.5 MG tablet, Take 0.5 mg by mouth 2 (two) times daily., Disp: , Rfl:  .  amLODipine (NORVASC) 10 MG tablet, Take 5 mg by mouth daily., Disp: , Rfl:  .  aspirin EC 81 MG tablet, Take 81 mg by mouth daily. Swallow whole., Disp: , Rfl:  .  atorvastatin (LIPITOR) 40 MG tablet, , Disp: , Rfl:  .  baclofen (LIORESAL) 10 MG tablet, Take 1 tablet by mouth 2 (two) times daily as needed., Disp: , Rfl: 2 .  Calcium Carbonate (CALCIUM 600 PO), Take by mouth daily. , Disp: , Rfl:  .  EASY COMFORT PEN NEEDLES 31G X 8 MM MISC, AS DIRECTED FOUR TIMES DAILY, Disp: , Rfl:  .  fexofenadine (ALLEGRA) 60 MG tablet, Take by mouth., Disp: , Rfl:  .  gabapentin (NEURONTIN) 100 MG capsule, 2 capsules twice a day, 3 capsules at night, Disp: , Rfl:  .  HUMALOG KWIKPEN 200 UNIT/ML SOPN, Inject 2-5 Units into the skin 3 (three) times daily. If glucose is above 90 and she is eating, Disp: , Rfl: 3 .  IBU 800 MG tablet, Take 800 mg by mouth 3 (three) times daily as needed., Disp: , Rfl:  .  Lancets (ONETOUCH DELICA PLUS LANCET33G) MISC, Apply topically 4 (four) times daily., Disp: , Rfl:  .  levothyroxine (SYNTHROID, LEVOTHROID) 137 MCG tablet, Take 137 mcg by mouth daily before breakfast., Disp: , Rfl:  .  metFORMIN (GLUCOPHAGE) 500 MG tablet, Take 1 tablet (500 mg total) by mouth 2 (two) times  daily with a meal. 2 tabs bid, Disp: , Rfl:  .  nitroGLYCERIN (NITROSTAT) 0.4 MG SL tablet, Place 0.4 mg under the tongue every 5 (five) minutes as needed for chest pain., Disp: , Rfl:  .  ONETOUCH ULTRA test strip, 4 (four) times daily., Disp: , Rfl:  .  ticagrelor (BRILINTA) 90 MG TABS tablet, Take 90 mg by mouth 2 (two) times daily., Disp: , Rfl:  .  TRESIBA FLEXTOUCH 200 UNIT/ML SOPN, Inject 25 Units into the skin at bedtime. Taking in am, Disp: , Rfl: 3 .  valsartan (DIOVAN) 80 MG tablet, Take 80 mg by mouth daily., Disp: , Rfl:   Past Medical History: Past Medical History:  Diagnosis Date  . Arthritis   . COPD (chronic obstructive pulmonary disease) (HCC)   . Diabetes mellitus   . DVT (deep venous thrombosis) (HCC)    years ago  . GERD (gastroesophageal reflux disease)   . High cholesterol   . Hypertension   . Hypothyroidism   . Thyroid disease     Tobacco Use: Social History   Tobacco Use  Smoking Status Former Smoker  . Packs/day: 0.50  Smokeless Tobacco Never Used    Labs: Recent Review Flowsheet Data    Labs for ITP Cardiac and Pulmonary Rehab Latest Ref Rng & Units 03/12/2016 06/14/2016 09/20/2016 01/04/2017 04/11/2017   Hemoglobin A1c 4.8 -  5.6 % 7.6(H) 8.0(H) 7.7(H) 8.5(H) 9.6(H)      Capillary Blood Glucose: Lab Results  Component Value Date   GLUCAP 265 (H) 02/27/2021   GLUCAP 120 (H) 02/20/2021   GLUCAP 67 (L) 02/20/2021   GLUCAP 75 02/13/2021   GLUCAP 246 (H) 08/21/2019    POCT Glucose    Row Name 03/31/21 1100             POCT Blood Glucose   Pre-Exercise 220 mg/dL              Exercise Target Goals: Exercise Program Goal: Individual exercise prescription set using results from initial 6 min walk test and THRR while considering  patient's activity barriers and safety.   Exercise Prescription Goal: Starting with aerobic activity 30 plus minutes a day, 3 days per week for initial exercise prescription. Provide home exercise prescription and  guidelines that participant acknowledges understanding prior to discharge.  Activity Barriers & Risk Stratification:  Activity Barriers & Cardiac Risk Stratification - 02/10/21 1207      Activity Barriers & Cardiac Risk Stratification   Activity Barriers Balance Concerns;Deconditioning;Muscular Weakness;Joint Problems;Arthritis    Cardiac Risk Stratification High           6 Minute Walk:  6 Minute Walk    Row Name 02/10/21 1206         6 Minute Walk   Phase Initial     Distance 600 feet     Walk Time 6 minutes     # of Rest Breaks 0     MPH 1.14     METS 1.49     RPE 12     VO2 Peak 5.22     Symptoms No     Resting HR 77 bpm     Resting BP 124/80     Resting Oxygen Saturation  99 %     Exercise Oxygen Saturation  during 6 min walk 100 %     Max Ex. HR 80 bpm     Max Ex. BP 130/70     2 Minute Post BP 112/72            Oxygen Initial Assessment:   Oxygen Re-Evaluation:   Oxygen Discharge (Final Oxygen Re-Evaluation):   Initial Exercise Prescription:  Initial Exercise Prescription - 02/10/21 1200      Date of Initial Exercise RX and Referring Provider   Date 02/10/21    Referring Provider Dr. Sharrell Ku    Expected Discharge Date 05/05/21      NuStep   Level 1    SPM 60    Minutes 39      Prescription Details   Frequency (times per week) 3    Duration Progress to 30 minutes of continuous aerobic without signs/symptoms of physical distress      Intensity   THRR 40-80% of Max Heartrate 58-116    Ratings of Perceived Exertion 11-13    Perceived Dyspnea 0-4      Resistance Training   Training Prescription Yes    Weight 1 lb    Reps 10-15           Perform Capillary Blood Glucose checks as needed.  Exercise Prescription Changes:   Exercise Prescription Changes    Row Name 02/17/21 1324 03/06/21 1300 03/20/21 1200 03/31/21 1100       Response to Exercise   Blood Pressure (Admit) 114/70 100/60 106/70 130/66    Blood Pressure (Exercise)  118/66 130/64 136/74 112/62  Blood Pressure (Exit) 102/60 96/56 110/68 112/60    Heart Rate (Admit) 70 bpm 61 bpm 63 bpm 77 bpm    Heart Rate (Exercise) 108 bpm 92 bpm 91 bpm 122 bpm    Heart Rate (Exit) 79 bpm 70 bpm 69 bpm 86 bpm    Rating of Perceived Exertion (Exercise) 13 15 13 13     Duration Continue with 30 min of aerobic exercise without signs/symptoms of physical distress. Continue with 30 min of aerobic exercise without signs/symptoms of physical distress. Continue with 30 min of aerobic exercise without signs/symptoms of physical distress. Continue with 30 min of aerobic exercise without signs/symptoms of physical distress.    Intensity THRR unchanged THRR unchanged THRR unchanged THRR unchanged         Progression   Progression Continue to progress workloads to maintain intensity without signs/symptoms of physical distress. Continue to progress workloads to maintain intensity without signs/symptoms of physical distress. Continue to progress workloads to maintain intensity without signs/symptoms of physical distress. Continue to progress workloads to maintain intensity without signs/symptoms of physical distress.         Resistance Training   Training Prescription Yes Yes Yes Yes    Weight 1 lb 2 lbs 2 lbs 2 lbs    Reps 10-15 10-15 10-15 10-15    Time 10 Minutes 10 Minutes 10 Minutes 10 Minutes         NuStep   Level 1 1 2 2     SPM 80 94 104 112    Minutes 39 39 39 39    METs 1.7 2.1 2.2 2.48         Home Exercise Plan   Plans to continue exercise at -- Home (comment) -- --    Frequency -- Add 3 additional days to program exercise sessions. -- --    Initial Home Exercises Provided -- 03/06/21 -- --           Exercise Comments:   Exercise Comments    Row Name 02/15/21 1158 02/22/21 1130 03/06/21 1327       Exercise Comments Patient completed first exercise session today. She said that she really enjoyed exercising today and looks forward to coming back. She  tolerated exercise well with no complaints. She has tried to come a couple of times but was unable to exercise due to high blood sugar. Her blood sugar was WNL to exercise today. Patient was unable to participate in exercise today due to her blood glucose levels being above limits to exercise safely. Her blood sugar was 309. Patient and I went over home exercise program today. She is currently exercising at home by waslking 15 min ever day she is not in rehab. We increased her walking time to 20 min            Exercise Goals and Review:   Exercise Goals    Row Name 02/10/21 1209 03/06/21 1323 04/03/21 1354         Exercise Goals   Increase Physical Activity Yes Yes Yes     Intervention Provide advice, education, support and counseling about physical activity/exercise needs.;Develop an individualized exercise prescription for aerobic and resistive training based on initial evaluation findings, risk stratification, comorbidities and participant's personal goals. Provide advice, education, support and counseling about physical activity/exercise needs.;Develop an individualized exercise prescription for aerobic and resistive training based on initial evaluation findings, risk stratification, comorbidities and participant's personal goals. Provide advice, education, support and counseling about physical activity/exercise needs.;Develop an individualized  exercise prescription for aerobic and resistive training based on initial evaluation findings, risk stratification, comorbidities and participant's personal goals.     Expected Outcomes Short Term: Attend rehab on a regular basis to increase amount of physical activity.;Long Term: Add in home exercise to make exercise part of routine and to increase amount of physical activity.;Long Term: Exercising regularly at least 3-5 days a week. Short Term: Attend rehab on a regular basis to increase amount of physical activity.;Long Term: Add in home exercise to  make exercise part of routine and to increase amount of physical activity.;Long Term: Exercising regularly at least 3-5 days a week. Short Term: Attend rehab on a regular basis to increase amount of physical activity.;Long Term: Add in home exercise to make exercise part of routine and to increase amount of physical activity.;Long Term: Exercising regularly at least 3-5 days a week.     Increase Strength and Stamina Yes Yes Yes     Intervention Provide advice, education, support and counseling about physical activity/exercise needs.;Develop an individualized exercise prescription for aerobic and resistive training based on initial evaluation findings, risk stratification, comorbidities and participant's personal goals. Provide advice, education, support and counseling about physical activity/exercise needs.;Develop an individualized exercise prescription for aerobic and resistive training based on initial evaluation findings, risk stratification, comorbidities and participant's personal goals. Provide advice, education, support and counseling about physical activity/exercise needs.;Develop an individualized exercise prescription for aerobic and resistive training based on initial evaluation findings, risk stratification, comorbidities and participant's personal goals.     Expected Outcomes Short Term: Increase workloads from initial exercise prescription for resistance, speed, and METs.;Short Term: Perform resistance training exercises routinely during rehab and add in resistance training at home;Long Term: Improve cardiorespiratory fitness, muscular endurance and strength as measured by increased METs and functional capacity ( ) Short Term: Increase workloads from initial exercise prescription for resistance, speed, and METs.;Short Term: Perform resistance training exercises routinely during rehab and add in resistance training at home;Long Term: Improve cardiorespiratory fitness, muscular endurance and  strength as measured by increased METs and functional capacity ( ) Short Term: Increase workloads from initial exercise prescription for resistance, speed, and METs.;Short Term: Perform resistance training exercises routinely during rehab and add in resistance training at home;Long Term: Improve cardiorespiratory fitness, muscular endurance and strength as measured by increased METs and functional capacity ( )     Able to understand and use rate of perceived exertion (RPE) scale Yes Yes Yes     Intervention Provide education and explanation on how to use RPE scale Provide education and explanation on how to use RPE scale Provide education and explanation on how to use RPE scale     Expected Outcomes Short Term: Able to use RPE daily in rehab to express subjective intensity level;Long Term:  Able to use RPE to guide intensity level when exercising independently Short Term: Able to use RPE daily in rehab to express subjective intensity level;Long Term:  Able to use RPE to guide intensity level when exercising independently Short Term: Able to use RPE daily in rehab to express subjective intensity level;Long Term:  Able to use RPE to guide intensity level when exercising independently     Knowledge and understanding of Target Heart Rate Range (THRR) Yes Yes Yes     Intervention Provide education and explanation of THRR including how the numbers were predicted and where they are located for reference Provide education and explanation of THRR including how the numbers were predicted and where they are located for reference Provide  education and explanation of THRR including how the numbers were predicted and where they are located for reference     Expected Outcomes Short Term: Able to state/look up THRR;Long Term: Able to use THRR to govern intensity when exercising independently;Short Term: Able to use daily as guideline for intensity in rehab Short Term: Able to state/look up THRR;Long Term: Able to use  THRR to govern intensity when exercising independently;Short Term: Able to use daily as guideline for intensity in rehab Short Term: Able to state/look up THRR;Long Term: Able to use THRR to govern intensity when exercising independently;Short Term: Able to use daily as guideline for intensity in rehab     Able to check pulse independently Yes Yes Yes     Intervention Provide education and demonstration on how to check pulse in carotid and radial arteries.;Review the importance of being able to check your own pulse for safety during independent exercise Provide education and demonstration on how to check pulse in carotid and radial arteries.;Review the importance of being able to check your own pulse for safety during independent exercise Provide education and demonstration on how to check pulse in carotid and radial arteries.;Review the importance of being able to check your own pulse for safety during independent exercise     Expected Outcomes Short Term: Able to explain why pulse checking is important during independent exercise;Long Term: Able to check pulse independently and accurately Short Term: Able to explain why pulse checking is important during independent exercise;Long Term: Able to check pulse independently and accurately Short Term: Able to explain why pulse checking is important during independent exercise;Long Term: Able to check pulse independently and accurately     Understanding of Exercise Prescription Yes Yes Yes     Intervention Provide education, explanation, and written materials on patient's individual exercise prescription Provide education, explanation, and written materials on patient's individual exercise prescription Provide education, explanation, and written materials on patient's individual exercise prescription     Expected Outcomes Short Term: Able to explain program exercise prescription;Long Term: Able to explain home exercise prescription to exercise independently Short  Term: Able to explain program exercise prescription;Long Term: Able to explain home exercise prescription to exercise independently Short Term: Able to explain program exercise prescription;Long Term: Able to explain home exercise prescription to exercise independently            Exercise Goals Re-Evaluation :  Exercise Goals Re-Evaluation    Row Name 03/06/21 1323 04/03/21 1355           Exercise Goal Re-Evaluation   Exercise Goals Review Increase Physical Activity;Increase Strength and Stamina;Able to understand and use rate of perceived exertion (RPE) scale;Knowledge and understanding of Target Heart Rate Range (THRR);Able to check pulse independently;Understanding of Exercise Prescription Increase Physical Activity;Increase Strength and Stamina;Able to understand and use rate of perceived exertion (RPE) scale;Knowledge and understanding of Target Heart Rate Range (THRR);Able to check pulse independently;Understanding of Exercise Prescription      Comments Patient has completed 5 exercise sessions. She has tolerated exercise well and has been ablt to progress a little bit each time. She has not been able to exercise regularly due to wither too low or too high blood glucose levels before exercise. She has had to be sent home a couple of times for these reasons. She expresses her frustration with it and is now getting to rehab early in case complications arise with blood glucose levels, we have time to adjust before her class starts. She enjoys exercising and  gets very happy when she is allowed to participate. She has started exercising at home walking 15 minutes every day. We increased the time walking to 20 minutes per day at home. She is currently exercising at 2.1 METs on the NuStep at rehab. Will continue to monitor and progress as able. Pt has completed 16 sessions of cardiac rehab. Her blood glucose levels have been much more in control, and she has not had to miss anymore sessions due to  hyper/hypoglycemia. She has been able to progress in the program now that she is attending regularly. She has began to exercise at home as well. She is currently exercising at 2.48 METs on the stepper. Will continue to monitor and progress as able.      Expected Outcomes Through exercise at rehab and with a home exercise program, patient will achieve their goals. Through exercise at rehab and with a home exercise program, patient will achieve their goals.              Discharge Exercise Prescription (Final Exercise Prescription Changes):  Exercise Prescription Changes - 03/31/21 1100      Response to Exercise   Blood Pressure (Admit) 130/66    Blood Pressure (Exercise) 112/62    Blood Pressure (Exit) 112/60    Heart Rate (Admit) 77 bpm    Heart Rate (Exercise) 122 bpm    Heart Rate (Exit) 86 bpm    Rating of Perceived Exertion (Exercise) 13    Duration Continue with 30 min of aerobic exercise without signs/symptoms of physical distress.    Intensity THRR unchanged      Progression   Progression Continue to progress workloads to maintain intensity without signs/symptoms of physical distress.      Resistance Training   Training Prescription Yes    Weight 2 lbs    Reps 10-15    Time 10 Minutes      NuStep   Level 2    SPM 112    Minutes 39    METs 2.48           Nutrition:  Target Goals: Understanding of nutrition guidelines, daily intake of sodium 1500mg , cholesterol 200mg , calories 30% from fat and 7% or less from saturated fats, daily to have 5 or more servings of fruits and vegetables.  Biometrics:  Pre Biometrics - 03/20/21 1259      Pre Biometrics   Weight 62.7 kg            Nutrition Therapy Plan and Nutrition Goals:  Nutrition Therapy & Goals - 02/27/21 0744      Personal Nutrition Goals   Comments Patient's initial Medficts diet assessment score was 15. She says she trys to follow a diabetic diet. She is already meeting with an RD at Dr. Reuben Likes  office routinely. We will provide 2 educational handouts with discussion regarding a heart healthy diet.      Intervention Plan   Intervention Nutrition handout(s) given to patient.           Nutrition Assessments:  Nutrition Assessments - 02/09/21 1334      MEDFICTS Scores   Pre Score 15          MEDIFICTS Score Key:  ?70 Need to make dietary changes   40-70 Heart Healthy Diet  ? 40 Therapeutic Level Cholesterol Diet   Picture Your Plate Scores:  <40 Unhealthy dietary pattern with much room for improvement.  41-50 Dietary pattern unlikely to meet recommendations for good health and room for  improvement.  51-60 More healthful dietary pattern, with some room for improvement.   >60 Healthy dietary pattern, although there may be some specific behaviors that could be improved.    Nutrition Goals Re-Evaluation:   Nutrition Goals Discharge (Final Nutrition Goals Re-Evaluation):   Psychosocial: Target Goals: Acknowledge presence or absence of significant depression and/or stress, maximize coping skills, provide positive support system. Participant is able to verbalize types and ability to use techniques and skills needed for reducing stress and depression.  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      Family Dynamics   Good Support System? Yes    Comments Patient cares for her adult son who has bipolar disorder. He lives with her. She also has 2 other sons that live near her and she has several grandchildren. She says her son is very difficult to manage at times and he refuses medication for his disorder. Her other sons do help her out with him but she is the primary caregiver. She says she does feel depressed and  her pcp has recommended antidepressants but she refuses to take. She does take Alprazolam BID for anxiety and to help her sleep. She says her depressed feelings are mainly related to her declining physically health and not being able to clean her house like she used to do and do other activities outside the home. She hopes the program will help her get some of her strength back to be able to do the activities she wants to do.      Barriers   Psychosocial barriers to participate in program Psychosocial barriers identified (see note)      Screening Interventions   Interventions Provide feedback about the scores to participant;Encouraged to exercise    Expected Outcomes Short Term goal: Identification and review with participant of any Quality of Life or Depression concerns found by scoring the questionnaire.;Long Term goal: The participant improves quality of Life and PHQ9 Scores as seen by post scores and/or verbalization of changes           Quality of Life Scores:  Quality of Life - 02/09/21 1559      Quality of Life   Select Quality of Life      Quality of Life Scores   Health/Function Pre 17.43 %    Socioeconomic Pre 20.83 %    Psych/Spiritual Pre 12.71 %    Family Pre 19.88 %    GLOBAL Pre 17.34 %          Scores of 19 and below usually indicate a poorer quality of life in these areas.  A difference of  2-3 points is a clinically meaningful difference.  A difference of 2-3 points in the total score of the Quality of Life Index has been associated with significant improvement in overall quality of life, self-image, physical symptoms, and general health in studies assessing change in quality of life.  PHQ-9: Recent Review Flowsheet Data    Depression screen [facility] 2/9 02/09/2021 04/18/2017 01/15/2017 09/27/2016 03/19/2016   Decreased Interest 1 0 0 0 0   Down, Depressed, Hopeless 3 0 0 0 0   PHQ - 2 Score 4 0 0 0 0   Altered sleeping 0 - - - -  Tired, decreased energy 0 - - - -    Change in appetite 0 - - - -   Feeling bad or failure about yourself  3 - - - -   Trouble concentrating 3 - - - -   Moving slowly or fidgety/restless 0 - - - -   Suicidal thoughts 0 - - - -   PHQ-9 Score 10 - - - -     Interpretation of Total Score  Total Score Depression Severity:  1-4 = Minimal depression, 5-9 = Mild depression, 10-14 = Moderate depression, 15-19 = Moderately severe depression, 20-27 = Severe depression   Psychosocial Evaluation and Intervention:  Psychosocial Evaluation - 02/10/21 0742      Psychosocial Evaluation & Interventions   Interventions Stress management education;Relaxation education    Comments Patient's initial QOL score was 17.34 overall scoring lowest in Pscho/Spiritual. Her PHQ-9 score was 8. She does have depressed feelings related to her declining health and she uses Alprazolam for anxiety and to help her sleep. She does identify her adult son with bipolar disorder as a stressor in her life.    Expected Outcomes Patient will continue to have her anxiety managed and her depressed feelings will improve with her strength improving as she goes through the program.    Continue Psychosocial Services  No Follow up required           Psychosocial Re-Evaluation:  Psychosocial Re-Evaluation    Row Name 02/27/21 0746 03/27/21 1248           Psychosocial Re-Evaluation   Current issues with Current Depression;Current Anxiety/Panic Current Depression;Current Anxiety/Panic      Comments Patient is new to the program completing 4 sessions. She is doing well so far but has been frustrated due to not being able to exercise 2 times due to glucose control. Her anxiety continues to be managed well with Alprazolam. 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 She continues to has a positive outlook and hopes to be able to complete the program with better controlled DM. Will continue to montior. Patient  is new to the program completing 14 sessions. She continues to do well with better controlled glucose readings allowing her to be able to exercise. Her anxiety continues to be managed well with Alprazolam. 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 She continues to has a positive outlook and hopes to be able to complete the program with better controlled DM. Will continue to montior.      Expected Outcomes Patient's psychosocial barriers will continue to be managed with no additional barriers identified. Patient's psychosocial barriers will continue to be managed with no additional barriers identified.      Interventions Stress management education;Relaxation education;Encouraged to attend Cardiac Rehabilitation for the exercise Stress management education;Relaxation education;Encouraged to attend Cardiac Rehabilitation for the exercise      Continue Psychosocial Services  No Follow up required No Follow up required             Psychosocial Discharge (Final Psychosocial Re-Evaluation):  Psychosocial Re-Evaluation - 03/27/21 1248      Psychosocial Re-Evaluation   Current issues with Current Depression;Current Anxiety/Panic    Comments Patient is new to the program completing 14 sessions. She continues to do well with better controlled glucose readings allowing her to be able to exercise. Her anxiety continues to  be managed well with Alprazolam. 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 She continues to has a positive outlook and hopes to be able to complete the program with better controlled DM. Will continue to montior.    Expected Outcomes Patient's psychosocial barriers will continue to be managed with no additional barriers identified.    Interventions Stress management education;Relaxation education;Encouraged to attend Cardiac Rehabilitation for the exercise     Continue Psychosocial Services  No Follow up required           Vocational Rehabilitation: Provide vocational rehab assistance to qualifying candidates.   Vocational Rehab Evaluation & Intervention:  Vocational Rehab - 02/09/21 1335      Initial Vocational Rehab Evaluation & Intervention   Assessment shows need for Vocational Rehabilitation No      Vocational Rehab Re-Evaulation   Comments Patient is retired and is not interested in returning to work. She does not need vocational rehab.           Education: Education Goals: Education classes will be provided on a weekly basis, covering required topics. Participant will state understanding/return demonstration of topics presented.  Learning Barriers/Preferences:  Learning Barriers/Preferences - 02/09/21 1335      Learning Barriers/Preferences   Learning Barriers Hearing    Learning Preferences Audio           Education Topics: Hypertension, Hypertension Reduction -Define heart disease and high blood pressure. Discus how high blood pressure affects the body and ways to reduce high blood pressure. Flowsheet Row CARDIAC REHAB PHASE II EXERCISE from 03/29/2021 in [place] CARDIAC REHABILITATION  Date 03/08/21  Educator mk  Instruction Review Code 2- Demonstrated Understanding      Exercise and Your Heart -Discuss why it is important to exercise, the FITT principles of exercise, normal and abnormal responses to exercise, and how to exercise safely. Flowsheet Row CARDIAC REHAB PHASE II EXERCISE from 03/29/2021 in [place] CARDIAC REHABILITATION  Date 03/15/21  Educator mk  Instruction Review Code 2- Demonstrated Understanding      Angina -Discuss definition of angina, causes of angina, treatment of angina, and how to decrease risk of having angina. Flowsheet Row CARDIAC REHAB PHASE II EXERCISE from 03/29/2021 in [place] CARDIAC REHABILITATION  Date 03/22/21  Educator mk  Instruction Review Code 2-  Demonstrated Understanding      Cardiac Medications -Review what the following cardiac medications are used for, how they affect the body, and side effects that may occur when taking the medications.  Medications include Aspirin, Beta blockers, calcium channel blockers, ACE Inhibitors, angiotensin receptor blockers, diuretics, digoxin, and antihyperlipidemics. Flowsheet Row CARDIAC REHAB PHASE II EXERCISE from 03/29/2021 in [place] CARDIAC REHABILITATION  Date 03/29/21  Educator mk  Instruction Review Code 2- Demonstrated Understanding      Congestive Heart Failure -Discuss the definition of CHF, how to live with CHF, the signs and symptoms of CHF, and how keep track of weight and sodium intake.   Heart Disease and Intimacy -Discus the effect sexual activity has on the heart, how changes occur during intimacy as we age, and safety during sexual activity.   Smoking Cessation / COPD -Discuss different methods to quit smoking, the health benefits of quitting smoking, and the definition of COPD.   Nutrition I: Fats -Discuss the types of cholesterol, what cholesterol does to the heart, and how cholesterol levels can be controlled.   Nutrition II: Labels -Discuss the  different components of food labels and how to read food label   Heart Parts/Heart Disease and PAD -Discuss the anatomy of the heart, the pathway of blood circulation through the heart, and these are affected by heart disease.   Stress I: Signs and Symptoms -Discuss the causes of stress, how stress may lead to anxiety and depression, and ways to limit stress. Flowsheet Row CARDIAC REHAB PHASE II EXERCISE from 03/29/2021 in [place] CARDIAC REHABILITATION  Date 02/15/21  Educator mk  Instruction Review Code 2- Demonstrated Understanding      Stress II: Relaxation -Discuss different types of relaxation techniques to limit stress.   Warning Signs of Stroke / TIA -Discuss definition of a stroke, what the signs  and symptoms are of a stroke, and how to identify when someone is having stroke.   Knowledge Questionnaire Score:  Knowledge Questionnaire Score - 02/09/21 1335      Knowledge Questionnaire Score   Pre Score 17/24           Core Components/Risk Factors/Patient Goals at Admission:  Personal Goals and Risk Factors at Admission - 02/09/21 1335      Core Components/Risk Factors/Patient Goals on Admission    Weight Management Weight Gain    Diabetes Yes    Intervention Provide education about signs/symptoms and action to take for hypo/hyperglycemia.;Provide education about proper nutrition, including hydration, and aerobic/resistive exercise prescription along with prescribed medications to achieve blood glucose in normal ranges: Fasting glucose 65-99 mg/dL    Expected Outcomes Short Term: Participant verbalizes understanding of the signs/symptoms and immediate care of hyper/hypoglycemia, proper foot care and importance of medication, aerobic/resistive exercise and nutrition plan for blood glucose control.    Stress Yes    Intervention Offer individual and/or small group education and counseling on adjustment to heart disease, stress management and health-related lifestyle change. Teach and support self-help strategies.    Expected Outcomes Short Term: Participant demonstrates changes in health-related behavior, relaxation and other stress management skills, ability to obtain effective social support, and compliance with psychotropic medications if prescribed.    Personal Goal Other Yes    Personal Goal Get stronger and be able to return to her ADL's.    Intervention Provide cardiac rehab 3 days/week and guided exercise prescription 2 days/week at home.    Expected Outcomes Patient will complete the program meeting both program and personal goals.           Core Components/Risk Factors/Patient Goals Review:   Goals and Risk Factor Review    Row Name 02/27/21 0740 03/27/21 1250            Core Components/Risk Factors/Patient Goals Review   Personal Goals Review Diabetes;Stress;Other Diabetes;Stress;Other      Review Patient is new to the program completing 4 sessions. She was referred to [facility] wtih NSTEMI and Stent placement. She has multiple risk factors for CAD and is participating in the program for risk modification. She has not been able to exercise a few days due to hypoglycemia or hyperglycemia. Her DM is managed by an endrocrinologist whom she saw 02/21/21. He decreased her Tresiba to 25U and decreased her Meftormin from 1000 to 500 mg daily. Her personal goals for the program are to be able to return to her ADL's and get stronger. We will continue to monitor her progress as she works toward meeting these goals. Patient has completed 14 sessions gaining 1 lb since last 30 day review. She is doing well in the program with consistent attendance  and progressions. Her last Hgb A1C was 12/09/20 at 9%. Her blood glucose readings have been more stable and within our parameters for exercise 110-300 mg/dl. Her glucose readings before exercise are labile ranging from 250 to 126 mg/dl. She sees an endocrinologist for DM control. She is on insulin and metformin. Her blood pressure is WNL. Her personal goals for the program are to be able to return to her ADL's and get stronger. She says she feels like she is gettting stronger and the program is helping her. Will continue to monitor for progress as she works towards meeting these goals.      Expected Outcomes Patient will complete the program meeting both program and personal goals. Patient will complete the program meeting both program and personal goals.             Core Components/Risk Factors/Patient Goals at Discharge (Final Review):   Goals and Risk Factor Review - 03/27/21 1250      Core Components/Risk Factors/Patient Goals Review   Personal Goals Review Diabetes;Stress;Other    Review Patient has completed 14 sessions gaining 1 lb  since last 30 day review. She is doing well in the program with consistent attendance and progressions. Her last Hgb A1C was 12/09/20 at 9%. Her blood glucose readings have been more stable and within our parameters for exercise 110-300 mg/dl. Her glucose readings before exercise are labile ranging from 250 to 126 mg/dl. She sees an endocrinologist for DM control. She is on insulin and metformin. Her blood pressure is WNL. Her personal goals for the program are to be able to return to her ADL's and get stronger. She says she feels like she is gettting stronger and the program is helping her. Will continue to monitor for progress as she works towards meeting these goals.    Expected Outcomes Patient will complete the program meeting both program and personal goals.           ITP Comments:   Comments: ITP REVIEW Pt is making expected progress toward Cardiac Rehab goals after completing 20 sessions. Recommend continued exercise, life style modification, education, and increased stamina and strength.

## 2021-04-06 ENCOUNTER — Ambulatory Visit: Payer: Medicare Other | Admitting: Nutrition

## 2021-04-07 ENCOUNTER — Encounter (HOSPITAL_COMMUNITY)
Admission: RE | Admit: 2021-04-07 | Discharge: 2021-04-07 | Disposition: A | Discharge status: Home / Self Care | Payer: Medicare Other | Source: Ambulatory Visit | Attending: Internal Medicine | Admitting: Internal Medicine

## 2021-04-07 ENCOUNTER — Other Ambulatory Visit: Payer: Self-pay

## 2021-04-07 DIAGNOSIS — I214 Non-ST elevation (NSTEMI) myocardial infarction: Secondary | ICD-10-CM | POA: Diagnosis not present

## 2021-04-07 DIAGNOSIS — Z955 Presence of coronary angioplasty implant and graft: Secondary | ICD-10-CM

## 2021-04-07 NOTE — Progress Notes (Signed)
Daily Session Note  Patient Details  Name: Gabrielle Wood MRN: [id number] Date of Birth: 10/04/1946 Referring Provider:   Flowsheet Row CARDIAC REHAB PHASE II ORIENTATION from 02/10/2021 in [place]  Referring Provider Dr. Candis Musa      Encounter Date: 04/07/2021  Check In:  Session Check In - 04/07/21 1100      Check-In   Supervising physician immediately available to respond to emergencies [facility] MD immediately available    Physician(s) Dr. Harl Bowie    Location [facility]    Staff Present Cathren Harsh, MS, Exercise Physiologist;Debra Wynetta Emery, RN, BSN    Virtual Visit No    Medication changes reported     No    Fall or balance concerns reported    No    Tobacco Cessation No Change    Warm-up and Cool-down Performed as group-led instruction    Resistance Training Performed Yes    VAD Patient? No    PAD/SET Patient? No      Pain Assessment   Currently in Pain? No/denies    Pain Score 0-No pain    Multiple Pain Sites No           Capillary Blood Glucose: No results found for this or any previous visit (from the past 24 hour(s)).    Social History   Tobacco Use  Smoking Status Former Smoker  . Packs/day: 0.50  Smokeless Tobacco Never Used    Goals Met:  Independence with exercise equipment Exercise tolerated well No report of cardiac concerns or symptoms Strength training completed today  Goals Unmet:  Not Applicable  Comments: check out 1200   Dr. Kathie Dike is Medical Director for [facility].

## 2021-04-10 ENCOUNTER — Encounter (HOSPITAL_COMMUNITY)
Admission: RE | Admit: 2021-04-10 | Discharge: 2021-04-10 | Disposition: A | Discharge status: Home / Self Care | Payer: Medicare Other | Source: Ambulatory Visit | Attending: Internal Medicine | Admitting: Internal Medicine

## 2021-04-10 ENCOUNTER — Other Ambulatory Visit: Payer: Self-pay

## 2021-04-10 DIAGNOSIS — Z955 Presence of coronary angioplasty implant and graft: Secondary | ICD-10-CM | POA: Diagnosis not present

## 2021-04-10 DIAGNOSIS — I214 Non-ST elevation (NSTEMI) myocardial infarction: Secondary | ICD-10-CM | POA: Diagnosis not present

## 2021-04-10 NOTE — Progress Notes (Signed)
Daily Session Note  Patient Details  Name: Gabrielle Wood MRN: [id number] Date of Birth: 1946-08-30 Referring Provider:   Flowsheet Row CARDIAC REHAB PHASE II ORIENTATION from 02/10/2021 in [place]  Referring Provider Dr. Candis Musa      Encounter Date: 04/10/2021  Check In:  Session Check In - 04/10/21 1100      Check-In   Supervising physician immediately available to respond to emergencies [facility] MD immediately available    Physician(s) Dr. Domenic Polite    Location [facility]    Staff Present Cathren Harsh, MS, Exercise Physiologist;Debra Wynetta Emery, RN, BSN    Virtual Visit No    Medication changes reported     No    Fall or balance concerns reported    No    Tobacco Cessation No Change    Warm-up and Cool-down Performed as group-led instruction    Resistance Training Performed Yes    VAD Patient? No    PAD/SET Patient? No      Pain Assessment   Currently in Pain? No/denies    Pain Score 0-No pain    Multiple Pain Sites No           Capillary Blood Glucose: No results found for this or any previous visit (from the past 24 hour(s)).    Social History   Tobacco Use  Smoking Status Former Smoker  . Packs/day: 0.50  Smokeless Tobacco Never Used    Goals Met:  Independence with exercise equipment Exercise tolerated well No report of cardiac concerns or symptoms Strength training completed today  Goals Unmet:  Not Applicable  Comments: check out 1200   Dr. Kathie Dike is Medical Director for [facility].

## 2021-04-11 DIAGNOSIS — E785 Hyperlipidemia, unspecified: Secondary | ICD-10-CM | POA: Diagnosis not present

## 2021-04-11 DIAGNOSIS — I2510 Atherosclerotic heart disease of native coronary artery without angina pectoris: Secondary | ICD-10-CM | POA: Diagnosis not present

## 2021-04-11 DIAGNOSIS — I10 Essential (primary) hypertension: Secondary | ICD-10-CM | POA: Diagnosis not present

## 2021-04-12 ENCOUNTER — Other Ambulatory Visit: Payer: Self-pay

## 2021-04-12 ENCOUNTER — Encounter (HOSPITAL_COMMUNITY)
Admission: RE | Admit: 2021-04-12 | Discharge: 2021-04-12 | Disposition: A | Discharge status: Home / Self Care | Payer: Medicare Other | Source: Ambulatory Visit | Attending: Internal Medicine | Admitting: Internal Medicine

## 2021-04-12 DIAGNOSIS — Z955 Presence of coronary angioplasty implant and graft: Secondary | ICD-10-CM

## 2021-04-12 DIAGNOSIS — I214 Non-ST elevation (NSTEMI) myocardial infarction: Secondary | ICD-10-CM | POA: Diagnosis not present

## 2021-04-12 NOTE — Progress Notes (Signed)
Daily Session Note  Patient Details  Name: CHERRISE OCCHIPINTI MRN: [id number] Date of Birth: [birth year] Referring Provider:   Flowsheet Row CARDIAC REHAB PHASE II ORIENTATION from 02/10/2021 in [place]  Referring Provider Dr. Candis Musa      Encounter Date: 04/12/2021  Check In:  Session Check In - 04/12/21 1100      Check-In   Supervising physician immediately available to respond to emergencies [facility] MD immediately available    Physician(s) Dr. Harl Bowie    Location [facility]    Staff Present Cathren Harsh, MS, Exercise Physiologist;Debra Wynetta Emery, RN, BSN    Virtual Visit No    Medication changes reported     No    Fall or balance concerns reported    No    Tobacco Cessation No Change    Warm-up and Cool-down Performed as group-led instruction    Resistance Training Performed Yes    VAD Patient? No    PAD/SET Patient? No      Pain Assessment   Currently in Pain? No/denies    Pain Score 0-No pain    Multiple Pain Sites No           Capillary Blood Glucose: No results found for this or any previous visit (from the past 24 hour(s)).    Social History   Tobacco Use  Smoking Status Former Smoker  . Packs/day: 0.50  Smokeless Tobacco Never Used    Goals Met:  Independence with exercise equipment Exercise tolerated well No report of cardiac concerns or symptoms Strength training completed today  Goals Unmet:  Not Applicable  Comments: check out 1200   Dr. Kathie Dike is Medical Director for [facility] Pulmonary Re[facility].

## 2021-04-13 ENCOUNTER — Other Ambulatory Visit: Payer: Self-pay

## 2021-04-13 ENCOUNTER — Emergency Department (HOSPITAL_COMMUNITY): Payer: Medicare Other

## 2021-04-13 ENCOUNTER — Emergency Department (HOSPITAL_COMMUNITY)
Admission: EM | Admit: 2021-04-13 | Discharge: 2021-04-13 | Disposition: A | Discharge status: Home / Self Care | Payer: Medicare Other | Attending: Emergency Medicine | Admitting: Emergency Medicine

## 2021-04-13 ENCOUNTER — Encounter (HOSPITAL_COMMUNITY): Payer: Self-pay | Admitting: *Deleted

## 2021-04-13 DIAGNOSIS — H9203 Otalgia, bilateral: Secondary | ICD-10-CM | POA: Diagnosis present

## 2021-04-13 DIAGNOSIS — E039 Hypothyroidism, unspecified: Secondary | ICD-10-CM | POA: Diagnosis not present

## 2021-04-13 DIAGNOSIS — Z7984 Long term (current) use of oral hypoglycemic drugs: Secondary | ICD-10-CM | POA: Diagnosis not present

## 2021-04-13 DIAGNOSIS — Z7982 Long term (current) use of aspirin: Secondary | ICD-10-CM | POA: Insufficient documentation

## 2021-04-13 DIAGNOSIS — J449 Chronic obstructive pulmonary disease, unspecified: Secondary | ICD-10-CM | POA: Insufficient documentation

## 2021-04-13 DIAGNOSIS — I10 Essential (primary) hypertension: Secondary | ICD-10-CM | POA: Diagnosis not present

## 2021-04-13 DIAGNOSIS — Z79899 Other long term (current) drug therapy: Secondary | ICD-10-CM | POA: Diagnosis not present

## 2021-04-13 DIAGNOSIS — E1165 Type 2 diabetes mellitus with hyperglycemia: Secondary | ICD-10-CM | POA: Diagnosis not present

## 2021-04-13 DIAGNOSIS — Z711 Person with feared health complaint in whom no diagnosis is made: Secondary | ICD-10-CM | POA: Insufficient documentation

## 2021-04-13 DIAGNOSIS — Z6821 Body mass index (BMI) 21.0-21.9, adult: Secondary | ICD-10-CM | POA: Diagnosis not present

## 2021-04-13 DIAGNOSIS — E119 Type 2 diabetes mellitus without complications: Secondary | ICD-10-CM | POA: Diagnosis not present

## 2021-04-13 DIAGNOSIS — R197 Diarrhea, unspecified: Secondary | ICD-10-CM | POA: Diagnosis not present

## 2021-04-13 DIAGNOSIS — R0602 Shortness of breath: Secondary | ICD-10-CM | POA: Diagnosis not present

## 2021-04-13 DIAGNOSIS — F32A Depression, unspecified: Secondary | ICD-10-CM | POA: Diagnosis not present

## 2021-04-13 DIAGNOSIS — Z794 Long term (current) use of insulin: Secondary | ICD-10-CM | POA: Diagnosis not present

## 2021-04-13 DIAGNOSIS — J302 Other seasonal allergic rhinitis: Secondary | ICD-10-CM | POA: Diagnosis not present

## 2021-04-13 DIAGNOSIS — Z87891 Personal history of nicotine dependence: Secondary | ICD-10-CM | POA: Insufficient documentation

## 2021-04-13 LAB — TROPONIN I (HIGH SENSITIVITY)
Troponin I (High Sensitivity): 19 ng/L — ABNORMAL HIGH (ref <18)
Troponin I (High Sensitivity): 22 ng/L — ABNORMAL HIGH (ref <18)

## 2021-04-13 LAB — BASIC METABOLIC PANEL
Anion gap: 7 (ref 5–15)
BUN: 21 mg/dL (ref 8–23)
CO2: 23 mmol/L (ref 22–32)
Calcium: 9.2 mg/dL (ref 8.9–10.3)
Chloride: 104 mmol/L (ref 98–111)
Creatinine, Ser: 0.93 mg/dL (ref 0.44–1.00)
GFR, Estimated: >60 mL/min (ref >60)
Glucose, Bld: 251 mg/dL — ABNORMAL HIGH (ref 70–99)
Potassium: 3.6 mmol/L (ref 3.5–5.1)
Sodium: 134 mmol/L — ABNORMAL LOW (ref 135–145)

## 2021-04-13 LAB — CBC
HCT: 41.3 % (ref 36.0–46.0)
Hemoglobin: 13.8 g/dL (ref 12.0–15.0)
MCH: 32.1 pg (ref 26.0–34.0)
MCHC: 33.4 g/dL (ref 30.0–36.0)
MCV: 96 fL (ref 80.0–100.0)
Platelets: 151 10*3/uL (ref 150–400)
RBC: 4.3 MIL/uL (ref 3.87–5.11)
RDW: 13.9 % (ref 11.5–15.5)
WBC: 5.9 10*3/uL (ref 4.0–10.5)
nRBC: 0 % (ref 0.0–0.2)

## 2021-04-13 MED ORDER — LEVOCETIRIZINE DIHYDROCHLORIDE 5 MG PO TABS: 5.0000 mg | ORAL_TABLET | Freq: Every evening | ORAL | 1 refills | Status: DC

## 2021-04-13 NOTE — ED Provider Notes (Signed)
[facility] EMERGENCY DEPARTMENT Provider Note   CSN: [id number] Arrival date & time: 04/13/21  1524     History Chief Complaint  Patient presents with  . Shortness of Breath    Gabrielle Wood is a 75 y.o. female.  HPI   This patient is a 75 year old female, she has a known history of COPD, she is a diabetic, she has a history of hypertension hypothyroidism and had a recent non-ST elevation myocardial infarction for which she was admitted to the hospital and treated.  According to the medical record the patient had presented to an outside hospital whereupon recognizing that she was having a non-ST elevation MI she was transferred to the [facility] for a left heart catheterization which showed a 95% left circumflex culprit lesion and 90% diagonal stenosis.  She had percutaneous coronary intervention with a drug-eluting stent to the left circumflex artery, she was discharged on aspirin and Brilinta which she has continued to take.  The patient follows with an office in [place] at [facility], she followed up today for a normal follow-up, she was asymptomatic, when she arrived she was told that her oxygen was 88% and that she needed to go to the emergency department.  The patient chose to come to this emergency department.  The patient denies having any chest pain, she denies feeling short of breath, she denies exertional dyspnea orthopnea or swelling of the legs.  She has no chest pain, no back pain, no neck pain, she feels like her normal self and has been doing very well doing her normal cardiac rehab which is well-documented in the medical history.  Again the patient is completely asymptomatic but was told her oxygen was low.  She denies coughing and fevers  The patient will complete also complains of having bilateral ear discomfort, itchy watery eyes, despite taking Allegra    Past Medical History:  Diagnosis Date  . Arthritis   .  COPD (chronic obstructive pulmonary disease) (HCC)   . Diabetes mellitus   . DVT (deep venous thrombosis) (HCC)    years ago  . GERD (gastroesophageal reflux disease)   . High cholesterol   . Hypertension   . Hypothyroidism   . Thyroid disease     Patient Active Problem List   Diagnosis Date Noted  . History of colonic polyps 08/27/2018  . Taking multiple medications for chronic disease 08/27/2018  . Uncontrolled type 2 diabetes mellitus with circulatory disorder, with long-term current use of insulin (HCC) 09/14/2015  . Tobacco abuse counseling 09/14/2015  . Gastritis and duodenitis 04/05/2012  . Pancreatitis, acute 04/03/2012  . Mixed hyperlipidemia 04/03/2012  . Benign hypertension 04/03/2012  . Hypothyroidism 04/03/2012  . GERD (gastroesophageal reflux disease) 04/03/2012    Past Surgical History:  Procedure Laterality Date  . BACK SURGERY     lumbar x3   . ESOPHAGOGASTRODUODENOSCOPY  04/04/2012   Moderate gastritis/MODERATE Duodenitis in the duodenal bulb duodenum  . EUS  05/14/2012   Normal pancreas, gallbladder,biliary tree/ Incidental finding of simple-appearing right renal cyst.  . TONSILLECTOMY    . TUBAL LIGATION    . VEIN SURGERY     stripping years ago but recent laser surgery; bilateral legs     OB History   No obstetric history on file.     Family History  Problem Relation Age of Onset  . Pancreatic cancer Mother        diagnosed at age 19s, died from heart  attack  . Inflammatory bowel disease Brother        crohn's, his dgt deceased from colon cancer at age 76  . Colon cancer Neg Hx     Social History   Tobacco Use  . Smoking status: Former Smoker    Packs/day: 0.50  . Smokeless tobacco: Never Used  Vaping Use  . Vaping Use: Never used  Substance Use Topics  . Alcohol use: No  . Drug use: No    Home Medications Prior to Admission medications   Medication Sig Start Date End Date Taking? Authorizing Provider  ALPRAZolam Prudy Feeler(XANAX) 1 MG  tablet Take 0.5-1 mg by mouth. 0.5 tablet in am and 1 tab at night 04/11/21  Yes [provider]  aspirin EC 81 MG tablet Take 81 mg by mouth daily. Swallow whole.   Yes [provider]  atorvastatin (LIPITOR) 40 MG tablet Take 40 mg by mouth every evening. 02/06/21  Yes [provider]  baclofen (LIORESAL) 10 MG tablet Take 1 tablet by mouth 2 (two) times daily as needed. 08/22/18  Yes [provider]  EASY COMFORT PEN NEEDLES 31G X 8 MM MISC AS DIRECTED FOUR TIMES DAILY 02/02/21  Yes [provider]  gabapentin (NEURONTIN) 100 MG capsule 2 capsules twice a day, 3 capsules at night   Yes [provider]  HUMALOG KWIKPEN 200 UNIT/ML SOPN Inject 2-5 Units into the skin 3 (three) times daily. If glucose is above 90 and she is eating 08/07/18  Yes [provider]  IBU 800 MG tablet Take 800 mg by mouth 3 (three) times daily as needed. 01/31/21  Yes [provider]  Lancets (ONETOUCH DELICA PLUS LANCET33G) MISC Apply topically 4 (four) times daily. 10/14/20  Yes [provider]  levocetirizine (XYZAL) 5 MG tablet Take 1 tablet (5 mg total) by mouth every evening. 04/13/21  Yes Eber HongMiller, Brennan Karam, MD  levothyroxine (SYNTHROID, LEVOTHROID) 137 MCG tablet Take 137 mcg by mouth daily before breakfast.   Yes [provider]  metFORMIN (GLUCOPHAGE) 500 MG tablet Take 1 tablet (500 mg total) by mouth 2 (two) times daily with a meal. 2 tabs bid 02/21/21  Yes Dani Gobbleeardon, [name] J, NP  metoprolol succinate (TOPROL-XL) 25 MG 24 hr tablet Take 1 tablet by mouth every evening. 04/05/21  Yes [provider]  nitroGLYCERIN (NITROSTAT) 0.4 MG SL tablet Place 0.4 mg under the tongue every 5 (five) minutes as needed for chest pain.   Yes [provider]  [facility] ULTRA test strip 4 (four) times daily. 01/26/21  Yes [provider]  ticagrelor (BRILINTA) 90 MG TABS tablet Take 90 mg by mouth 2 (two) times daily.   Yes [provider]  TRESIBA FLEXTOUCH 200 UNIT/ML SOPN Inject 25 Units into the skin at bedtime. Taking in am 07/25/18  Yes [provider]  valsartan (DIOVAN) 80 MG tablet Take 80 mg by mouth daily.   Yes [provider]  fexofenadine (ALLEGRA) 60 MG tablet Take by mouth.  04/13/21 Yes [provider]  amLODipine (NORVASC) 10 MG tablet Take 5 mg by mouth daily. Patient not taking: Reported on 04/13/2021    [provider]    Allergies    Ciprocin-fluocin-procin [fluocinolone], Lipitor [atorvastatin], Lisinopril, and Red yeast rice [cholestin]  Review of Systems   Review of Systems  All other systems reviewed and are negative.   Physical Exam Updated Vital Signs BP 126/81   Pulse 66   Temp 98.4 F (36.9 C)  Resp (!) 21   SpO2 97%   Physical Exam Vitals and nursing note reviewed.  Constitutional:      General: She is not in acute distress.    Appearance: She is well-developed.  HENT:     Head: Normocephalic and atraumatic.     Comments: The patient has a small perforation in her left tympanic membrane, there is no effusions or purulent collections    Mouth/Throat:     Pharynx: No oropharyngeal exudate.  Eyes:     General: No scleral icterus.       Right eye: No discharge.        Left eye: No discharge.     Extraocular Movements: Extraocular movements intact.     Conjunctiva/sclera: Conjunctivae normal.     Pupils: Pupils are equal, round, and reactive to light.     Comments: No conjunctival injection or periorbital edema  Neck:     Thyroid: No thyromegaly.     Vascular: No JVD.  Cardiovascular:     Rate and Rhythm: Normal rate and regular rhythm.     Heart sounds: Normal heart sounds. No murmur heard. No friction rub. No gallop.   Pulmonary:     Effort: Pulmonary effort is normal. No respiratory distress.     Breath sounds: Normal breath sounds. No wheezing or rales.  Abdominal:     General: Bowel sounds are normal. There is no  distension.     Palpations: Abdomen is soft. There is no mass.     Tenderness: There is no abdominal tenderness.  Musculoskeletal:        General: No tenderness. Normal range of motion.     Cervical back: Normal range of motion and neck supple.  Lymphadenopathy:     Cervical: No cervical adenopathy.  Skin:    General: Skin is warm and dry.     Findings: No erythema or rash.  Neurological:     Mental Status: She is alert.     Coordination: Coordination normal.  Psychiatric:        Behavior: Behavior normal.     ED Results / Procedures / Treatments   Labs (all labs ordered are listed, but only abnormal results are displayed) Labs Reviewed  BASIC METABOLIC PANEL - Abnormal; Notable for the following components:      Result Value   Sodium 134 (*)    Glucose, Bld 251 (*)    All other components within normal limits  TROPONIN I (HIGH SENSITIVITY) - Abnormal; Notable for the following components:   Troponin I (High Sensitivity) 19 (*)    All other components within normal limits  TROPONIN I (HIGH SENSITIVITY) - Abnormal; Notable for the following components:   Troponin I (High Sensitivity) 22 (*)    All other components within normal limits  CBC    EKG EKG Interpretation  Date/Time:  Thursday Apr 13 2021 15:44:28 EDT Ventricular Rate:  64 PR Interval:  155 QRS Duration: 85 QT Interval:  395 QTC Calculation: 408 R Axis:   -63 Text Interpretation: Sinus rhythm LAD, consider left anterior fascicular block Baseline wander in lead(s) III aVL Confirmed by Eber Hong ([id number]) on 04/13/2021 4:45:40 PM   Radiology DG Chest Port 1 View  Result Date: 04/13/2021 CLINICAL DATA:  Shortness of breath for several days EXAM: PORTABLE CHEST 1 VIEW COMPARISON:  12/07/2020 FINDINGS: Cardiac shadow is within normal limits. Aortic calcifications are noted. The lungs are hyperinflated consistent with COPD. No focal infiltrate or effusion is noted. No bony abnormality  is seen. IMPRESSION: COPD  without acute abnormality. Electronically Signed   By: Alcide Clever M.D.   On: 04/13/2021 16:13    Procedures Procedures   Medications Ordered in ED Medications - No data to display  ED Course  I have reviewed the triage vital signs and the nursing notes.  Pertinent labs & imaging results that were available during my care of the patient were reviewed by me and considered in my medical decision making (see chart for details).    MDM Rules/Calculators/A&P                          This patient's exam is completely unremarkable.  She does have a history of hypertension and heart disease so we will get a troponin and an EKG some basic labs and check an oxygen and a chest x-ray.  Her oxygen level at the bedside is 100% and she has clear lungs and a normal exam.  Labs unremarkable, troponin and delta troponin measured, no significant changes, no hypoxia, no tachycardia, EKG reassuring, patient likely has some seasonal allergies, will switch over to Xyzal.  Patient agreeable  Final Clinical Impression(s) / ED Diagnoses Final diagnoses:  Worried well  Seasonal allergies    Rx / DC Orders ED Discharge Orders         Ordered    levocetirizine (XYZAL) 5 MG tablet  Every evening        04/13/21 1952           Eber Hong, MD 04/13/21 1953

## 2021-04-13 NOTE — Discharge Instructions (Addendum)
Please change over to levocetirizine once a day for your allergies, stop taking Allegra  The rest of your testing shows no signs of acute abnormalities, your blood work x-ray and EKG are reassuring, no signs of heart attack or problems with your lungs today.

## 2021-04-13 NOTE — ED Triage Notes (Signed)
Referred by PCP for c/o shortness of breath x 3 days

## 2021-04-14 ENCOUNTER — Encounter (HOSPITAL_COMMUNITY): Payer: Medicare Other

## 2021-04-17 ENCOUNTER — Encounter (HOSPITAL_COMMUNITY)
Admission: RE | Admit: 2021-04-17 | Discharge: 2021-04-17 | Disposition: A | Discharge status: Home / Self Care | Payer: Medicare Other | Source: Ambulatory Visit | Attending: Internal Medicine | Admitting: Internal Medicine

## 2021-04-17 ENCOUNTER — Other Ambulatory Visit: Payer: Self-pay

## 2021-04-17 VITALS — Wt 132.1 lb

## 2021-04-17 DIAGNOSIS — I214 Non-ST elevation (NSTEMI) myocardial infarction: Secondary | ICD-10-CM | POA: Diagnosis not present

## 2021-04-17 DIAGNOSIS — Z955 Presence of coronary angioplasty implant and graft: Secondary | ICD-10-CM | POA: Diagnosis not present

## 2021-04-17 NOTE — Progress Notes (Signed)
Daily Session Note  Patient Details  Name: Gabrielle Wood MRN: [id number] Date of Birth: 01-11-1946 Referring Provider:   Flowsheet Row CARDIAC REHAB PHASE II ORIENTATION from 02/10/2021 in [place]  Referring Provider Dr. Candis Musa      Encounter Date: 04/17/2021  Check In:  Session Check In - 04/17/21 1100      Check-In   Supervising physician immediately available to respond to emergencies [facility] MD immediately available    Physician(s) Dr. Johnsie Cancel    Location [facility]    Staff Present Cathren Harsh, MS, Exercise Physiologist;Debra Wynetta Emery, RN, BSN;Phyllis Billingsley, RN    Virtual Visit No    Medication changes reported     No    Fall or balance concerns reported    No    Tobacco Cessation No Change    Warm-up and Cool-down Performed as group-led instruction    Resistance Training Performed Yes    VAD Patient? No    PAD/SET Patient? No      Pain Assessment   Currently in Pain? No/denies    Pain Score 0-No pain    Multiple Pain Sites No           Capillary Blood Glucose: No results found for this or any previous visit (from the past 24 hour(s)).    Social History   Tobacco Use  Smoking Status Former Smoker  . Packs/day: 0.50  Smokeless Tobacco Never Used    Goals Met:  Independence with exercise equipment Exercise tolerated well No report of cardiac concerns or symptoms Strength training completed today  Goals Unmet:  Not Applicable  Comments: check out 1200   Dr. Kathie Dike is Medical Director for [facility].

## 2021-04-18 ENCOUNTER — Encounter: Payer: Medicare Other | Attending: "Endocrinology | Admitting: Nutrition

## 2021-04-18 VITALS — Ht 66.0 in | Wt 131.8 lb

## 2021-04-18 DIAGNOSIS — E1165 Type 2 diabetes mellitus with hyperglycemia: Secondary | ICD-10-CM | POA: Insufficient documentation

## 2021-04-18 DIAGNOSIS — E1159 Type 2 diabetes mellitus with other circulatory complications: Secondary | ICD-10-CM | POA: Diagnosis not present

## 2021-04-18 DIAGNOSIS — Z794 Long term (current) use of insulin: Secondary | ICD-10-CM | POA: Insufficient documentation

## 2021-04-18 DIAGNOSIS — IMO0002 Reserved for concepts with insufficient information to code with codable children: Secondary | ICD-10-CM

## 2021-04-18 DIAGNOSIS — I10 Essential (primary) hypertension: Secondary | ICD-10-CM | POA: Diagnosis not present

## 2021-04-18 DIAGNOSIS — E782 Mixed hyperlipidemia: Secondary | ICD-10-CM

## 2021-04-18 NOTE — Progress Notes (Signed)
Medical Nutrition Therapy  Appointment Start time:  1000  Appointment End time:  1100  Primary concerns today: Diabetes Type 2  Referral diagnosis: E11.8 Preferred learning style: no preference  Learning readiness:  change in progress)  NUTRITION ASSESSMENT  FBS 75 mg/dl this morning.  Anthropometrics  Wt Readings from Last 3 Encounters:  04/18/21 131 lb 12.8 oz (59.8 kg)  04/17/21 132 lb 0.9 oz (59.9 kg)  03/20/21 138 lb 3.7 oz (62.7 kg)   Ht Readings from Last 3 Encounters:  04/18/21 5\' 6"  (1.676 m)  02/21/21 5\' 7"  (1.702 m)  02/10/21 5\' 5"  (1.651 m)   Body mass index is 21.27 kg/m. @BMIFA @ Facility age limit for growth percentiles is 20 years. Facility age limit for growth percentiles is 20 years. Clinical Medical Hx: Type 2, GERD, hyperlipidemia, Hypothyroidism Medications:Tresiba 20 units but cut back down to 10 units due to low blood sugars Humalog 6,7,8 depending on her readings before meals. Metformin 500 mg BID, Labs:   CMP Latest Ref Rng & Units 04/13/2021 08/21/2019 07/17/2017  Glucose 70 - 99 mg/dL ) 06/13/2021) 08/23/2019)  BUN 8 - 23 mg/dL 21 22 15   Creatinine 0.44 - 1.00 mg/dL 07/19/2017 220(U 542(H  Sodium 135 - 145 mmol/L 134(L) 134(L) 139  Potassium 3.5 - 5.1 mmol/L 3.6 3.6 4.1  Chloride 98 - 111 mmol/L 104 99 100  CO2 22 - 32 mmol/L 23 25 21   Calcium 8.9 - 10.3 mg/dL 9.2 9.5 9.8  Total Protein 6.0 - 8.5 g/dL - - 6.7  Total Bilirubin 0.0 - 1.2 mg/dL - - 0.5  Alkaline Phos 39 - 117 IU/L - - 56  AST 0 - 40 IU/L - - 13  ALT 0 - 32 IU/L - - 8    Lab Results  Component Value Date   HGBA1C 9.6 (H) 04/11/2017    Lipid Panel    Notable Signs/Symptoms:   Lifestyle & Dietary Hx  Estimated daily fluid intake: 64oz Supplements: Metformin, Humalog,  Sleep: 8 Stress / self-care:  Current average weekly physical activity: walks some  24-Hr Dietary Recall First Meal: Eggs, bacon and toast 1 slice Snack: Second Meal: cheese sandwich, water Snack:  Third  Meal: Roast, green beans,   Coffee Snack:  Beverages: 2-3 bottles of water   Estimated Energy Needs Calories: 7.62 Carbohydrate: 170g Protein: 112g Fat: 42-50g   NUTRITION DIAGNOSIS  NB-1.1 Food and nutrition-related knowledge deficit As related to Diabetes Type 2.  As evidenced by A1C 9.6%   NUTRITION INTERVENTION  Nutrition education (E-1) on the following topics:  Nutrition and Diabetes education provided on My Plate, CHO counting, meal planning, portion sizes, timing of meals, avoiding snacks between meals unless having a low blood sugar, target ranges for A1C and blood sugars, signs/symptoms and treatment of hyper/hypoglycemia, monitoring blood sugars, taking medications as prescribed, benefits of exercising 30 minutes per day and prevention of complications of DM.   Handouts Provided Include   The Plate Method   Meal Plan Card  Diabetes Instructions   Learning Style & Readiness for Change Teaching method utilized: Visual & Auditory  Demonstrated degree of understanding via: Teach Back  Barriers to learning/adherence to lifestyle change: none  Goals Established by Pt  Eat three balanced meals per day Be sure to give insulin with breakfast IF blood sugars is over 100. See chart Increase fruit with meals Eat 2-3 carb choices per meal Eat low carb vegetable with lunch and dinner Gain 1-2 lb per month Get A1C down  to 7%   MONITORING & EVALUATION Dietary intake, weekly physical activity, and blood sugar  in 1 month.  Next Steps  Patient is to meal planning.

## 2021-04-18 NOTE — Patient Instructions (Signed)
Goals  Eat three balanced meals per day Be sure to give insulin with breakfast IF blood sugars is over 100. See chart Increase fruit with meals Eat 2-3 carb choices per meal Eat low carb vegetable with lunch and dinner Gain 1-2 lb per month Get A1C down to 7%

## 2021-04-19 ENCOUNTER — Other Ambulatory Visit: Payer: Self-pay

## 2021-04-19 ENCOUNTER — Encounter (HOSPITAL_COMMUNITY)
Admission: RE | Admit: 2021-04-19 | Discharge: 2021-04-19 | Disposition: A | Discharge status: Home / Self Care | Payer: Medicare Other | Source: Ambulatory Visit | Attending: Internal Medicine | Admitting: Internal Medicine

## 2021-04-19 DIAGNOSIS — Z955 Presence of coronary angioplasty implant and graft: Secondary | ICD-10-CM

## 2021-04-19 DIAGNOSIS — I214 Non-ST elevation (NSTEMI) myocardial infarction: Secondary | ICD-10-CM

## 2021-04-19 NOTE — Progress Notes (Signed)
Daily Session Note  Patient Details  Name: Gabrielle Wood MRN: [id number] Date of Birth: [birth year] Referring Provider:   Flowsheet Row CARDIAC REHAB PHASE II ORIENTATION from 02/10/2021 in [place]  Referring Provider Dr. Candis Musa      Encounter Date: 04/19/2021  Check In:  Session Check In - 04/19/21 1100      Check-In   Supervising physician immediately available to respond to emergencies [facility] MD immediately available    Physician(s) Dr. Harl Bowie    Location [facility]    Staff Present Cathren Harsh, MS, Exercise Physiologist;Debra Wynetta Emery, RN, BSN    Medication changes reported     No    Fall or balance concerns reported    No    Tobacco Cessation No Change    Warm-up and Cool-down Performed as group-led instruction    Resistance Training Performed Yes    VAD Patient? No      Pain Assessment   Currently in Pain? No/denies    Pain Score 0-No pain    Multiple Pain Sites No           Capillary Blood Glucose: No results found for this or any previous visit (from the past 24 hour(s)).    Social History   Tobacco Use  Smoking Status Former Smoker  . Packs/day: 0.50  Smokeless Tobacco Never Used    Goals Met:  Independence with exercise equipment Exercise tolerated well No report of cardiac concerns or symptoms Strength training completed today  Goals Unmet:  Not Applicable  Comments: check out 1200   Dr. Kathie Dike is Medical Director for [facility].

## 2021-04-21 ENCOUNTER — Encounter (HOSPITAL_COMMUNITY): Payer: Medicare Other

## 2021-04-24 ENCOUNTER — Encounter (HOSPITAL_COMMUNITY): Payer: Medicare Other

## 2021-04-25 ENCOUNTER — Encounter: Payer: Self-pay | Admitting: Nutrition

## 2021-04-25 DIAGNOSIS — E7800 Pure hypercholesterolemia, unspecified: Secondary | ICD-10-CM | POA: Diagnosis not present

## 2021-04-25 DIAGNOSIS — I10 Essential (primary) hypertension: Secondary | ICD-10-CM | POA: Diagnosis not present

## 2021-04-25 DIAGNOSIS — F17210 Nicotine dependence, cigarettes, uncomplicated: Secondary | ICD-10-CM | POA: Diagnosis not present

## 2021-04-25 DIAGNOSIS — Z6821 Body mass index (BMI) 21.0-21.9, adult: Secondary | ICD-10-CM | POA: Diagnosis not present

## 2021-04-25 DIAGNOSIS — F32A Depression, unspecified: Secondary | ICD-10-CM | POA: Diagnosis not present

## 2021-04-25 DIAGNOSIS — R197 Diarrhea, unspecified: Secondary | ICD-10-CM | POA: Diagnosis not present

## 2021-04-25 DIAGNOSIS — E1165 Type 2 diabetes mellitus with hyperglycemia: Secondary | ICD-10-CM | POA: Diagnosis not present

## 2021-04-25 LAB — BASIC METABOLIC PANEL
BUN: 19 (ref 4–21)
CO2: 21 (ref 13–22)
Chloride: 100 (ref 99–108)
Creatinine: 1 (ref 0.5–1.1)
Glucose: 224
Potassium: 3.8 (ref 3.4–5.3)
Sodium: 139 (ref 137–147)

## 2021-04-25 LAB — COMPREHENSIVE METABOLIC PANEL
Albumin: 4.7 (ref 3.5–5.0)
Calcium: 10.1 (ref 8.7–10.7)
Globulin: 1.8

## 2021-04-25 LAB — TSH: TSH: 11 — AB (ref 0.41–5.90)

## 2021-04-25 LAB — HEPATIC FUNCTION PANEL
ALT: 10 (ref 7–35)
AST: 18 (ref 13–35)
Alkaline Phosphatase: 57 (ref 25–125)
Bilirubin, Total: 0.3

## 2021-04-25 LAB — HEMOGLOBIN A1C: Hemoglobin A1C: 9.6

## 2021-04-26 ENCOUNTER — Encounter (HOSPITAL_COMMUNITY)
Admission: RE | Admit: 2021-04-26 | Discharge: 2021-04-26 | Disposition: A | Discharge status: Home / Self Care | Payer: Medicare Other | Source: Ambulatory Visit | Attending: Internal Medicine | Admitting: Internal Medicine

## 2021-04-26 ENCOUNTER — Other Ambulatory Visit: Payer: Self-pay

## 2021-04-26 DIAGNOSIS — Z955 Presence of coronary angioplasty implant and graft: Secondary | ICD-10-CM | POA: Diagnosis not present

## 2021-04-26 DIAGNOSIS — I214 Non-ST elevation (NSTEMI) myocardial infarction: Secondary | ICD-10-CM | POA: Diagnosis not present

## 2021-04-26 NOTE — Progress Notes (Signed)
Daily Session Note  Patient Details  Name: Gabrielle Wood MRN: [id number] Date of Birth: 10-Jul-1946 Referring Provider:   Flowsheet Row CARDIAC REHAB PHASE II ORIENTATION from 02/10/2021 in [place]  Referring Provider Dr. Candis Musa      Encounter Date: 04/26/2021  Check In:  Session Check In - 04/26/21 1100      Check-In   Supervising physician immediately available to respond to emergencies [facility] MD immediately available    Physician(s) Dr. Domenic Polite    Location [facility]    Staff Present Cathren Harsh, MS, Exercise Physiologist;Debra Wynetta Emery, RN, BSN    Virtual Visit No    Medication changes reported     No    Fall or balance concerns reported    No    Tobacco Cessation No Change    Warm-up and Cool-down Performed as group-led instruction    Resistance Training Performed Yes    VAD Patient? No    PAD/SET Patient? No      Pain Assessment   Currently in Pain? No/denies    Pain Score 0-No pain    Multiple Pain Sites No           Capillary Blood Glucose: No results found for this or any previous visit (from the past 24 hour(s)).    Social History   Tobacco Use  Smoking Status Former Smoker  . Packs/day: 0.50  Smokeless Tobacco Never Used    Goals Met:  Independence with exercise equipment Exercise tolerated well No report of cardiac concerns or symptoms Strength training completed today  Goals Unmet:  Not Applicable  Comments: check out 1200   Dr. Kathie Dike is Medical Director for [facility].

## 2021-04-28 ENCOUNTER — Encounter (HOSPITAL_COMMUNITY): Payer: Medicare Other

## 2021-05-01 ENCOUNTER — Encounter (HOSPITAL_COMMUNITY): Payer: Medicare Other

## 2021-05-03 ENCOUNTER — Encounter (HOSPITAL_COMMUNITY)
Admission: RE | Admit: 2021-05-03 | Discharge: 2021-05-03 | Disposition: A | Discharge status: Home / Self Care | Payer: Medicare Other | Source: Ambulatory Visit | Attending: Internal Medicine | Admitting: Internal Medicine

## 2021-05-03 ENCOUNTER — Other Ambulatory Visit: Payer: Self-pay

## 2021-05-03 DIAGNOSIS — Z955 Presence of coronary angioplasty implant and graft: Secondary | ICD-10-CM | POA: Diagnosis not present

## 2021-05-03 DIAGNOSIS — I214 Non-ST elevation (NSTEMI) myocardial infarction: Secondary | ICD-10-CM | POA: Insufficient documentation

## 2021-05-05 ENCOUNTER — Encounter (HOSPITAL_COMMUNITY)
Admission: RE | Admit: 2021-05-05 | Discharge: 2021-05-05 | Disposition: A | Discharge status: Home / Self Care | Payer: Medicare Other | Source: Ambulatory Visit | Attending: Internal Medicine | Admitting: Internal Medicine

## 2021-05-05 ENCOUNTER — Other Ambulatory Visit: Payer: Self-pay

## 2021-05-05 ENCOUNTER — Encounter (HOSPITAL_COMMUNITY): Payer: Medicare Other

## 2021-05-05 DIAGNOSIS — I214 Non-ST elevation (NSTEMI) myocardial infarction: Secondary | ICD-10-CM | POA: Diagnosis not present

## 2021-05-05 DIAGNOSIS — Z955 Presence of coronary angioplasty implant and graft: Secondary | ICD-10-CM

## 2021-05-05 NOTE — Progress Notes (Signed)
Daily Session Note  Patient Details  Name: Gabrielle Wood MRN: [id number] Date of Birth: 23-Jul-1946 Referring Provider:   Flowsheet Row CARDIAC REHAB PHASE II ORIENTATION from 02/10/2021 in [place]  Referring Provider Dr. Candis Musa      Encounter Date: 05/05/2021  Check In:  Session Check In - 05/05/21 1107      Check-In   Supervising physician immediately available to respond to emergencies [facility] MD immediately available    Physician(s) Dr. Domenic Polite    Location [facility]    Staff Present Geanie Cooley, RN;Debra Wynetta Emery, RN, BSN    Virtual Visit No    Medication changes reported     No    Fall or balance concerns reported    No    Tobacco Cessation No Change    Warm-up and Cool-down Performed as group-led instruction    Resistance Training Performed Yes    VAD Patient? No    PAD/SET Patient? No      Pain Assessment   Currently in Pain? No/denies    Pain Score 0-No pain    Multiple Pain Sites No           Capillary Blood Glucose: No results found for this or any previous visit (from the past 24 hour(s)).    Social History   Tobacco Use  Smoking Status Former Smoker  . Packs/day: 0.50  Smokeless Tobacco Never Used    Goals Met:  Independence with exercise equipment Exercise tolerated well No report of cardiac concerns or symptoms Strength training completed today  Goals Unmet:  Not Applicable  Comments: check out @ 12:00pm   Dr. Kathie Dike is Medical Director for [facility].

## 2021-05-08 NOTE — Progress Notes (Signed)
Discharge Progress Report  Patient Details  Name: Gabrielle Wood MRN: [id number] Date of Birth: 1946/09/20 Referring Provider:   Flowsheet Row CARDIAC REHAB PHASE II ORIENTATION from 02/10/2021 in [place]  Referring Provider Dr. Candis Musa       Number of Visits: 29  Reason for Discharge:  Patient reached a stable level of exercise. Patient independent in their exercise. Patient has met program and personal goals.  Smoking History:  Social History   Tobacco Use  Smoking Status Former Smoker  . Packs/day: 0.50  Smokeless Tobacco Never Used    Diagnosis:  No diagnosis found.  ADL UCSD:   Initial Exercise Prescription:  Initial Exercise Prescription - 02/10/21 1200      Date of Initial Exercise RX and Referring Provider   Date 02/10/21    Referring Provider Dr. Candis Musa    Expected Discharge Date 05/05/21      NuStep   Level 1    SPM 60    Minutes 39      Prescription Details   Frequency (times per week) 3    Duration Progress to 30 minutes of continuous aerobic without signs/symptoms of physical distress      Intensity   THRR 40-80% of Max Heartrate 58-116    Ratings of Perceived Exertion 11-13    Perceived Dyspnea 0-4      Resistance Training   Training Prescription Yes    Weight 1 lb    Reps 10-15           Discharge Exercise Prescription (Final Exercise Prescription Changes):  Exercise Prescription Changes - 04/26/21 0822      Response to Exercise   Blood Pressure (Admit) 106/50    Blood Pressure (Exercise) 128/64    Blood Pressure (Exit) 96/62    Heart Rate (Admit) 73 bpm    Heart Rate (Exercise) 105 bpm    Heart Rate (Exit) 82 bpm    Rating of Perceived Exertion (Exercise) 13    Duration Continue with 30 min of aerobic exercise without signs/symptoms of physical distress.    Intensity THRR unchanged      Progression   Progression Continue to progress workloads to maintain intensity without signs/symptoms of physical  distress.      Resistance Training   Training Prescription Yes    Weight 2 lbs    Reps 10-15    Time 10 Minutes      NuStep   Level 2    SPM 104    Minutes 39    METs 2.61           Functional Capacity:  6 Minute Walk    Row Name 02/10/21 1206 05/03/21 1130       6 Minute Walk   Phase Initial Discharge    Distance 600 feet 700 feet    Walk Time 6 minutes 6 minutes    # of Rest Breaks 0 0    MPH 1.14 1.3    METS 1.49 1.99    RPE 12 12    VO2 Peak 5.22 6.98    Symptoms No No    Resting HR 77 bpm 72 bpm    Resting BP 124/80 112/58    Resting Oxygen Saturation  99 % 98 %    Exercise Oxygen Saturation  during 6 min walk 100 % 99 %    Max Ex. HR 80 bpm 82 bpm    Max Ex. BP 130/70 120/58    2 Minute Post BP 112/72  110/60           Psychological, QOL, Others - Outcomes: PHQ 2/9: Depression screen [facility] 2/9 05/05/2021 04/18/2021 02/09/2021 04/18/2017 01/15/2017  Decreased Interest 1 0 1 0 0  Down, Depressed, Hopeless 1 0 3 0 0  PHQ - 2 Score 2 0 4 0 0  Altered sleeping 1 - 0 - -  Tired, decreased energy 2 - 0 - -  Change in appetite 0 - 0 - -  Feeling bad or failure about yourself  1 - 3 - -  Trouble concentrating 2 - 3 - -  Moving slowly or fidgety/restless 2 - 0 - -  Suicidal thoughts 0 - 0 - -  PHQ-9 Score 10 - 10 - -  Difficult doing work/chores Somewhat difficult - - - -    Quality of Life:  Quality of Life - 05/05/21 1234      Quality of Life   Select Quality of Life      Quality of Life Scores   Health/Function Pre 17.43 %    Health/Function Post 20.62 %    Health/Function % Change 18.3 %    Socioeconomic Pre 20.83 %    Socioeconomic Post 26.25 %    Socioeconomic % Change  26.02 %    Psych/Spiritual Pre 12.71 %    Psych/Spiritual Post 25.75 %    Psych/Spiritual % Change 102.6 %    Family Pre 19.88 %    Family Post 21 %    Family % Change 5.63 %    GLOBAL Pre 17.34 %    GLOBAL Post 23.04 %    GLOBAL % Change 32.87 %           Personal  Goals: Goals established at orientation with interventions provided to work toward goal.  Personal Goals and Risk Factors at Admission - 02/09/21 1335      Core Components/Risk Factors/Patient Goals on Admission    Weight Management Weight Gain    Diabetes Yes    Intervention Provide education about signs/symptoms and action to take for hypo/hyperglycemia.;Provide education about proper nutrition, including hydration, and aerobic/resistive exercise prescription along with prescribed medications to achieve blood glucose in normal ranges: Fasting glucose 65-99 mg/dL    Expected Outcomes Short Term: Participant verbalizes understanding of the signs/symptoms and immediate care of hyper/hypoglycemia, proper foot care and importance of medication, aerobic/resistive exercise and nutrition plan for blood glucose control.    Stress Yes    Intervention Offer individual and/or small group education and counseling on adjustment to heart disease, stress management and health-related lifestyle change. Teach and support self-help strategies.    Expected Outcomes Short Term: Participant demonstrates changes in health-related behavior, relaxation and other stress management skills, ability to obtain effective social support, and compliance with psychotropic medications if prescribed.    Personal Goal Other Yes    Personal Goal Get stronger and be able to return to her ADL's.    Intervention Provide cardiac rehab 3 days/week and guided exercise prescription 2 days/week at home.    Expected Outcomes Patient will complete the program meeting both program and personal goals.            Personal Goals Discharge:  Goals and Risk Factor Review    Row Name 02/27/21 0740 03/27/21 1250 05/08/21 1342         Core Components/Risk Factors/Patient Goals Review   Personal Goals Review Diabetes;Stress;Other Diabetes;Stress;Other Diabetes;Stress;Other     Review Patient is new to the program completing 4 sessions.  She  was referred to CR wtih NSTEMI and Stent placement. She has multiple risk factors for CAD and is participating in the program for risk modification. She has not been able to exercise a few days due to hypoglycemia or hyperglycemia. Her DM is managed by an endrocrinologist whom she saw 02/21/21. He decreased her Tresiba to 25U and decreased her Meftormin from 1000 to 500 mg daily. Her personal goals for the program are to be able to return to her ADL's and get stronger. We will continue to monitor her progress as she works toward meeting these goals. Patient has completed 14 sessions gaining 1 lb since last 30 day review. She is doing well in the program with consistent attendance and progressions. Her last Hgb A1C was 12/09/20 at 9%. Her blood glucose readings have been more stable and within our parameters for exercise 110-300 mg/dl. Her glucose readings before exercise are labile ranging from 250 to 126 mg/dl. She sees an endocrinologist for DM control. She is on insulin and metformin. Her blood pressure is WNL. Her personal goals for the program are to be able to return to her ADL's and get stronger. She says she feels like she is gettting stronger and the program is helping her. Will continue to monitor for progress as she works towards meeting these goals. Patient has completed 29 exercise sessions before discharge. She has been doing well with managing and controlling her diabetes. She has had consistent attendance throughout the program. She continues to have blood pressures WNL. Her personal goals are to return to ADLs and get stronger. SHe had gotten stronger throughout the program. She reports feeling like rehab has helped her with her ADLs.     Expected Outcomes Patient will complete the program meeting both program and personal goals. Patient will complete the program meeting both program and personal goals. Patient will complete the program meeting both program and personal goals.             Exercise Goals and Review:  Exercise Goals    Row Name 02/10/21 1209 03/06/21 1323 04/03/21 1354 05/02/21 0826       Exercise Goals   Increase Physical Activity Yes Yes Yes Yes    Intervention Provide advice, education, support and counseling about physical activity/exercise needs.;Develop an individualized exercise prescription for aerobic and resistive training based on initial evaluation findings, risk stratification, comorbidities and participant's personal goals. Provide advice, education, support and counseling about physical activity/exercise needs.;Develop an individualized exercise prescription for aerobic and resistive training based on initial evaluation findings, risk stratification, comorbidities and participant's personal goals. Provide advice, education, support and counseling about physical activity/exercise needs.;Develop an individualized exercise prescription for aerobic and resistive training based on initial evaluation findings, risk stratification, comorbidities and participant's personal goals. Provide advice, education, support and counseling about physical activity/exercise needs.;Develop an individualized exercise prescription for aerobic and resistive training based on initial evaluation findings, risk stratification, comorbidities and participant's personal goals.    Expected Outcomes Short Term: Attend rehab on a regular basis to increase amount of physical activity.;Long Term: Add in home exercise to make exercise part of routine and to increase amount of physical activity.;Long Term: Exercising regularly at least 3-5 days a week. Short Term: Attend rehab on a regular basis to increase amount of physical activity.;Long Term: Add in home exercise to make exercise part of routine and to increase amount of physical activity.;Long Term: Exercising regularly at least 3-5 days a week. Short Term: Attend rehab on a regular  basis to increase amount of physical activity.;Long Term:  Add in home exercise to make exercise part of routine and to increase amount of physical activity.;Long Term: Exercising regularly at least 3-5 days a week. Short Term: Attend rehab on a regular basis to increase amount of physical activity.;Long Term: Add in home exercise to make exercise part of routine and to increase amount of physical activity.;Long Term: Exercising regularly at least 3-5 days a week.    Increase Strength and Stamina Yes Yes Yes Yes    Intervention Provide advice, education, support and counseling about physical activity/exercise needs.;Develop an individualized exercise prescription for aerobic and resistive training based on initial evaluation findings, risk stratification, comorbidities and participant's personal goals. Provide advice, education, support and counseling about physical activity/exercise needs.;Develop an individualized exercise prescription for aerobic and resistive training based on initial evaluation findings, risk stratification, comorbidities and participant's personal goals. Provide advice, education, support and counseling about physical activity/exercise needs.;Develop an individualized exercise prescription for aerobic and resistive training based on initial evaluation findings, risk stratification, comorbidities and participant's personal goals. Provide advice, education, support and counseling about physical activity/exercise needs.;Develop an individualized exercise prescription for aerobic and resistive training based on initial evaluation findings, risk stratification, comorbidities and participant's personal goals.    Expected Outcomes Short Term: Increase workloads from initial exercise prescription for resistance, speed, and METs.;Short Term: Perform resistance training exercises routinely during rehab and add in resistance training at home;Long Term: Improve cardiorespiratory fitness, muscular endurance and strength as measured by increased METs and  functional capacity (6MWT) Short Term: Increase workloads from initial exercise prescription for resistance, speed, and METs.;Short Term: Perform resistance training exercises routinely during rehab and add in resistance training at home;Long Term: Improve cardiorespiratory fitness, muscular endurance and strength as measured by increased METs and functional capacity (6MWT) Short Term: Increase workloads from initial exercise prescription for resistance, speed, and METs.;Short Term: Perform resistance training exercises routinely during rehab and add in resistance training at home;Long Term: Improve cardiorespiratory fitness, muscular endurance and strength as measured by increased METs and functional capacity (6MWT) Short Term: Increase workloads from initial exercise prescription for resistance, speed, and METs.;Short Term: Perform resistance training exercises routinely during rehab and add in resistance training at home;Long Term: Improve cardiorespiratory fitness, muscular endurance and strength as measured by increased METs and functional capacity (6MWT)    Able to understand and use rate of perceived exertion (RPE) scale Yes Yes Yes Yes    Intervention Provide education and explanation on how to use RPE scale Provide education and explanation on how to use RPE scale Provide education and explanation on how to use RPE scale Provide education and explanation on how to use RPE scale    Expected Outcomes Short Term: Able to use RPE daily in rehab to express subjective intensity level;Long Term:  Able to use RPE to guide intensity level when exercising independently Short Term: Able to use RPE daily in rehab to express subjective intensity level;Long Term:  Able to use RPE to guide intensity level when exercising independently Short Term: Able to use RPE daily in rehab to express subjective intensity level;Long Term:  Able to use RPE to guide intensity level when exercising independently Short Term: Able to use  RPE daily in rehab to express subjective intensity level;Long Term:  Able to use RPE to guide intensity level when exercising independently    Knowledge and understanding of Target Heart Rate Range (THRR) Yes Yes Yes Yes    Intervention Provide education and  explanation of THRR including how the numbers were predicted and where they are located for reference Provide education and explanation of THRR including how the numbers were predicted and where they are located for reference Provide education and explanation of THRR including how the numbers were predicted and where they are located for reference Provide education and explanation of THRR including how the numbers were predicted and where they are located for reference    Expected Outcomes Short Term: Able to state/look up THRR;Long Term: Able to use THRR to govern intensity when exercising independently;Short Term: Able to use daily as guideline for intensity in rehab Short Term: Able to state/look up THRR;Long Term: Able to use THRR to govern intensity when exercising independently;Short Term: Able to use daily as guideline for intensity in rehab Short Term: Able to state/look up THRR;Long Term: Able to use THRR to govern intensity when exercising independently;Short Term: Able to use daily as guideline for intensity in rehab Short Term: Able to state/look up THRR;Long Term: Able to use THRR to govern intensity when exercising independently;Short Term: Able to use daily as guideline for intensity in rehab    Able to check pulse independently Yes Yes Yes Yes    Intervention Provide education and demonstration on how to check pulse in carotid and radial arteries.;Review the importance of being able to check your own pulse for safety during independent exercise Provide education and demonstration on how to check pulse in carotid and radial arteries.;Review the importance of being able to check your own pulse for safety during independent exercise Provide  education and demonstration on how to check pulse in carotid and radial arteries.;Review the importance of being able to check your own pulse for safety during independent exercise Provide education and demonstration on how to check pulse in carotid and radial arteries.;Review the importance of being able to check your own pulse for safety during independent exercise    Expected Outcomes Short Term: Able to explain why pulse checking is important during independent exercise;Long Term: Able to check pulse independently and accurately Short Term: Able to explain why pulse checking is important during independent exercise;Long Term: Able to check pulse independently and accurately Short Term: Able to explain why pulse checking is important during independent exercise;Long Term: Able to check pulse independently and accurately Short Term: Able to explain why pulse checking is important during independent exercise;Long Term: Able to check pulse independently and accurately    Understanding of Exercise Prescription Yes Yes Yes Yes    Intervention Provide education, explanation, and written materials on patient's individual exercise prescription Provide education, explanation, and written materials on patient's individual exercise prescription Provide education, explanation, and written materials on patient's individual exercise prescription Provide education, explanation, and written materials on patient's individual exercise prescription    Expected Outcomes Short Term: Able to explain program exercise prescription;Long Term: Able to explain home exercise prescription to exercise independently Short Term: Able to explain program exercise prescription;Long Term: Able to explain home exercise prescription to exercise independently Short Term: Able to explain program exercise prescription;Long Term: Able to explain home exercise prescription to exercise independently Short Term: Able to explain program exercise  prescription;Long Term: Able to explain home exercise prescription to exercise independently           Exercise Goals Re-Evaluation:  Exercise Goals Re-Evaluation    Row Name 03/06/21 1323 04/03/21 1355 05/02/21 0823 05/08/21 1345       Exercise Goal Re-Evaluation   Exercise Goals Review Increase Physical Activity;Increase  Strength and Stamina;Able to understand and use rate of perceived exertion (RPE) scale;Knowledge and understanding of Target Heart Rate Range (THRR);Able to check pulse independently;Understanding of Exercise Prescription Increase Physical Activity;Increase Strength and Stamina;Able to understand and use rate of perceived exertion (RPE) scale;Knowledge and understanding of Target Heart Rate Range (THRR);Able to check pulse independently;Understanding of Exercise Prescription Increase Physical Activity;Increase Strength and Stamina;Able to understand and use rate of perceived exertion (RPE) scale;Knowledge and understanding of Target Heart Rate Range (THRR);Able to check pulse independently;Understanding of Exercise Prescription Increase Physical Activity;Increase Strength and Stamina;Able to understand and use rate of perceived exertion (RPE) scale;Knowledge and understanding of Target Heart Rate Range (THRR);Able to check pulse independently;Understanding of Exercise Prescription    Comments Patient has completed 5 exercise sessions. She has tolerated exercise well and has been ablt to progress a little bit each time. She has not been able to exercise regularly due to wither too low or too high blood glucose levels before exercise. She has had to be sent home a couple of times for these reasons. She expresses her frustration with it and is now getting to rehab early in case complications arise with blood glucose levels, we have time to adjust before her class starts. She enjoys exercising and gets very happy when she is allowed to participate. She has started exercising at home  walking 15 minutes every day. We increased the time walking to 20 minutes per day at home. She is currently exercising at 2.1 METs on the NuStep at rehab. Will continue to monitor and progress as able. Pt has completed 16 sessions of cardiac rehab. Her blood glucose levels have been much more in control, and she has not had to miss anymore sessions due to hyper/hypoglycemia. She has been able to progress in the program now that she is attending regularly. She has began to exercise at home as well. She is currently exercising at 2.48 METs on the stepper. Will continue to monitor and progress as able. Patient has completed 22 exercise sessions. She has been progressing well and is able to do more in the sessions which is great. Her blood glucose levels are consistently in control and within normal levels to exercise. She is expected to graduate on 6/1. She is continuing to exercise at home. She is currently exercising at 2.61 METs. Will continue to monitor and progress as able. Patient will graduate from the program completing 29 exercise sessions. She had progressed well in the program. She maintained her weight throughout this program. She ended exercise at 2.86 METs. She will copntinue to exercise at home.    Expected Outcomes Through exercise at rehab and with a home exercise program, patient will achieve their goals. Through exercise at rehab and with a home exercise program, patient will achieve their goals. Through exercise at rehab and with a home exercise program, patient will achieve their goals. Through exercise at rehab and with a home exercise program, patient will achieve their goals.           Nutrition & Weight - Outcomes:  Pre Biometrics - 05/03/21 1131      Pre Biometrics   Weight 60.8 kg    Waist Circumference 28 inches    Hip Circumference 36.5 inches    Waist to Hip Ratio 0.77 %    BMI (Calculated) 21.64    Triceps Skinfold 21 mm    % Body Fat 32.4 %    Grip Strength 21.4 kg     Flexibility 0 in  Single Leg Stand 0 seconds            Nutrition:  Nutrition Therapy & Goals - 02/27/21 0744      Personal Nutrition Goals   Comments Patient's initial Medficts diet assessment score was 15. She says she trys to follow a diabetic diet. She is already meeting with an RD at Dr. Avelina Laine office routinely. We will provide 2 educational handouts with discussion regarding a heart healthy diet.      Intervention Plan   Intervention Nutrition handout(s) given to patient.           Nutrition Discharge:  Nutrition Assessments - 05/05/21 1226      MEDFICTS Scores   Post Score 0           Education Questionnaire Score:  Knowledge Questionnaire Score - 05/05/21 1225      Knowledge Questionnaire Score   Post Score 18/24          Patient graduated from [facility] today on 6-3-22after completing 29 sessions. They achieved LTG of 30 minutes of aerobic exercise at Max Met level of 2.86 All patients vitals are WNL. Discharge instruction has been reviewed in detail and patient stated an understanding of material given. Patient plans to exercise at home. Cardiac Rehab staff will make f/u call. Patient had no complaints of any abnormal S/S or pain on their exit visit.     Goals reviewed with patient; copy given to patient.

## 2021-05-11 DIAGNOSIS — R197 Diarrhea, unspecified: Secondary | ICD-10-CM | POA: Diagnosis not present

## 2021-05-11 DIAGNOSIS — F17210 Nicotine dependence, cigarettes, uncomplicated: Secondary | ICD-10-CM | POA: Diagnosis not present

## 2021-05-11 DIAGNOSIS — F32A Depression, unspecified: Secondary | ICD-10-CM | POA: Diagnosis not present

## 2021-05-11 DIAGNOSIS — E1165 Type 2 diabetes mellitus with hyperglycemia: Secondary | ICD-10-CM | POA: Diagnosis not present

## 2021-05-11 DIAGNOSIS — Z6820 Body mass index (BMI) 20.0-20.9, adult: Secondary | ICD-10-CM | POA: Diagnosis not present

## 2021-05-11 DIAGNOSIS — E039 Hypothyroidism, unspecified: Secondary | ICD-10-CM | POA: Diagnosis not present

## 2021-05-11 DIAGNOSIS — I10 Essential (primary) hypertension: Secondary | ICD-10-CM | POA: Diagnosis not present

## 2021-05-24 ENCOUNTER — Other Ambulatory Visit: Payer: Self-pay

## 2021-05-24 ENCOUNTER — Ambulatory Visit: Payer: Medicare Other | Admitting: Nurse Practitioner

## 2021-05-24 ENCOUNTER — Encounter: Payer: Self-pay | Admitting: Nurse Practitioner

## 2021-05-24 VITALS — BP 126/80 | HR 82 | Ht 67.0 in | Wt 132.0 lb

## 2021-05-24 DIAGNOSIS — Z794 Long term (current) use of insulin: Secondary | ICD-10-CM | POA: Diagnosis not present

## 2021-05-24 DIAGNOSIS — E1165 Type 2 diabetes mellitus with hyperglycemia: Secondary | ICD-10-CM

## 2021-05-24 DIAGNOSIS — E559 Vitamin D deficiency, unspecified: Secondary | ICD-10-CM

## 2021-05-24 DIAGNOSIS — IMO0002 Reserved for concepts with insufficient information to code with codable children: Secondary | ICD-10-CM

## 2021-05-24 DIAGNOSIS — E1159 Type 2 diabetes mellitus with other circulatory complications: Secondary | ICD-10-CM | POA: Diagnosis not present

## 2021-05-24 DIAGNOSIS — E039 Hypothyroidism, unspecified: Secondary | ICD-10-CM

## 2021-05-24 MED ORDER — METFORMIN HCL 500 MG PO TABS: 1000.0000 mg | ORAL_TABLET | Freq: Two times a day (BID) | ORAL | Status: DC

## 2021-05-24 NOTE — Patient Instructions (Signed)

## 2021-05-24 NOTE — Progress Notes (Signed)
Endocrinology Follow Up Note       05/24/2021, 3:55 PM   Subjective:    Patient ID: Gabrielle Wood, female    DOB: 09-21-1946.  Gabrielle Wood is being seen in follow up after being seen in consultation for management of currently uncontrolled symptomatic diabetes requested by  [name], MD.   Past Medical History:  Diagnosis Date   Arthritis    COPD (chronic obstructive pulmonary disease) (HCC)    Diabetes mellitus    DVT (deep venous thrombosis) (HCC)    years ago   GERD (gastroesophageal reflux disease)    High cholesterol    Hypertension    Hypothyroidism    Thyroid disease     Past Surgical History:  Procedure Laterality Date   BACK SURGERY     lumbar x3    ESOPHAGOGASTRODUODENOSCOPY  04/04/2012   Moderate gastritis/MODERATE Duodenitis in the duodenal bulb duodenum   EUS  05/14/2012   Normal pancreas, gallbladder,biliary tree/ Incidental finding of simple-appearing right renal cyst.   TONSILLECTOMY     TUBAL LIGATION     VEIN SURGERY     stripping years ago but recent laser surgery; bilateral legs    Social History   Socioeconomic History   Marital status: Widowed    Spouse name: Not on file   Number of children: 2   Years of education: Not on file   Highest education level: Not on file  Occupational History   Occupation: retired, Advertising copywriter at [employer]  Tobacco Use   Smoking status: Former    Packs/day: 0.50    Pack years: 0.00    Types: Cigarettes   Smokeless tobacco: Never  Vaping Use   Vaping Use: Never used  Substance and Sexual Activity   Alcohol use: No   Drug use: No   Sexual activity: Not on file  Other Topics Concern   Not on file  Social History Narrative   Not on file   Social Determinants of Health   Financial Resource Strain: Not on file  Food Insecurity: Not on file  Transportation Needs: Not on file  Physical Activity: Not on file  Stress: Not on  file  Social Connections: Not on file    Family History  Problem Relation Age of Onset   Pancreatic cancer Mother        diagnosed at age 24s, died from heart attack   Inflammatory bowel disease Brother        crohn's, his dgt deceased from colon cancer at age 67   Colon cancer Neg Hx     Outpatient Encounter Medications as of 05/24/2021  Medication Sig   ALPRAZolam (XANAX) 1 MG tablet Take 0.5-1 mg by mouth. 0.5 tablet in am and 1 tab at night   aspirin EC 81 MG tablet Take 81 mg by mouth daily. Swallow whole.   atorvastatin (LIPITOR) 40 MG tablet Take 40 mg by mouth every evening.   baclofen (LIORESAL) 10 MG tablet Take 1 tablet by mouth 2 (two) times daily as needed.   EASY COMFORT PEN NEEDLES 31G X 8 MM MISC AS DIRECTED FOUR TIMES DAILY   gabapentin (NEURONTIN) 100 MG capsule 2 capsules twice  a day, 3 capsules at night   HUMALOG KWIKPEN 200 UNIT/ML SOPN Inject 2-5 Units into the skin 3 (three) times daily. If glucose is above 90 and she is eating   IBU 800 MG tablet Take 800 mg by mouth 3 (three) times daily as needed.   Lancets (ONETOUCH DELICA PLUS LANCET33G) MISC Apply topically 4 (four) times daily.   levocetirizine (XYZAL) 5 MG tablet Take 1 tablet (5 mg total) by mouth every evening.   levothyroxine (SYNTHROID, LEVOTHROID) 137 MCG tablet Take 137 mcg by mouth daily before breakfast.   metFORMIN (GLUCOPHAGE) 500 MG tablet Take 2 tablets (1,000 mg total) by mouth 2 (two) times daily with a meal. 2 tabs bid   metoprolol succinate (TOPROL-XL) 25 MG 24 hr tablet Take 1 tablet by mouth every evening.   nitroGLYCERIN (NITROSTAT) 0.4 MG SL tablet Place 0.4 mg under the tongue every 5 (five) minutes as needed for chest pain.   ONETOUCH ULTRA test strip 4 (four) times daily.   ticagrelor (BRILINTA) 90 MG TABS tablet Take 90 mg by mouth 2 (two) times daily.   TRESIBA FLEXTOUCH 200 UNIT/ML SOPN Inject 20 Units into the skin at bedtime.   valsartan (DIOVAN) 80 MG tablet Take 80 mg by  mouth daily.   [DISCONTINUED] amLODipine (NORVASC) 10 MG tablet Take 5 mg by mouth daily. (Patient not taking: Reported on 04/13/2021)   [DISCONTINUED] fexofenadine (ALLEGRA) 60 MG tablet Take by mouth.   [DISCONTINUED] metFORMIN (GLUCOPHAGE) 500 MG tablet Take 1 tablet (500 mg total) by mouth 2 (two) times daily with a meal. 2 tabs bid   No facility-administered encounter medications on file as of 05/24/2021.    ALLERGIES: Allergies  Allergen Reactions   Ciprocin-Fluocin-Procin [Fluocinolone]     Nightmares   Lipitor [Atorvastatin] Other (See Comments)    Muscle aches, pain   Lisinopril Cough   Red Yeast Rice [Cholestin] Itching and Rash    VACCINATION STATUS: Immunization History  Administered Date(s) Administered   Pneumococcal Polysaccharide-23 04/04/2012    Diabetes She presents for her follow-up diabetic visit. She has type 2 diabetes mellitus. Onset time: Was diagnosed at approx age of 27. Her disease course has been stable. There are no hypoglycemic associated symptoms. Pertinent negatives for hypoglycemia include no sweats or tremors. Associated symptoms include fatigue and foot paresthesias. Pertinent negatives for diabetes include no polydipsia and no polyuria. There are no hypoglycemic complications. Symptoms are stable. Diabetic complications include heart disease and peripheral neuropathy. (Had a MI in the past) Risk factors for coronary artery disease include diabetes mellitus, dyslipidemia, hypertension, post-menopausal and sedentary lifestyle. Current diabetic treatment includes intensive insulin program and oral agent (monotherapy). She is compliant with treatment most of the time. Her weight is fluctuating minimally. She is following a generally healthy diet. When asked about meal planning, she reported none. She has not had a previous visit with a dietitian. She participates in exercise intermittently. Her home blood glucose trend is increasing steadily. Her breakfast  blood glucose range is generally 180-200 mg/dl. Her lunch blood glucose range is generally 180-200 mg/dl. Her dinner blood glucose range is generally >200 mg/dl. Her bedtime blood glucose range is generally >200 mg/dl. Her overall blood glucose range is 180-200 mg/dl. (She presents today with her logs, no meter, showing increasing glucose readings overall.  She saw her PCP since last visit who increased her Tresiba to 18 units nightly and increased her Metformin to 1000 mg po twice daily with meals.  She has not had anymore  occasions of hypoglycemia.  She finished cardiac rehab approx 1 month ago.) An ACE inhibitor/angiotensin II receptor blocker is being taken. She does not see a podiatrist.Eye exam is current.  Hyperlipidemia This is a chronic problem. The current episode started more than 1 year ago. The problem is uncontrolled. Recent lipid tests were reviewed and are high. Exacerbating diseases include chronic renal disease, diabetes and hypothyroidism. There are no known factors aggravating her hyperlipidemia. Current antihyperlipidemic treatment includes statins. The current treatment provides mild improvement of lipids. Compliance problems include adherence to diet and adherence to exercise.  Risk factors for coronary artery disease include diabetes mellitus, dyslipidemia, hypertension, post-menopausal and a sedentary lifestyle.  Hypertension This is a chronic problem. The current episode started more than 1 year ago. The problem has been resolved since onset. The problem is controlled. Pertinent negatives include no sweats. Agents associated with hypertension include thyroid hormones. Risk factors for coronary artery disease include diabetes mellitus, dyslipidemia, post-menopausal state and sedentary lifestyle. Past treatments include angiotensin blockers and calcium channel blockers. There are no compliance problems.  Hypertensive end-organ damage includes kidney disease and CAD/MI. Identifiable causes  of hypertension include chronic renal disease and a thyroid problem.    Review of systems  Constitutional: + Minimally fluctuating body weight,  current Body mass index is 20.67 kg/m. , no fatigue, no subjective hyperthermia, no subjective hypothermia Eyes: no blurry vision, no xerophthalmia ENT: no sore throat, no nodules palpated in throat, no dysphagia/odynophagia, no hoarseness Cardiovascular: no chest pain, no shortness of breath, no palpitations, no leg swelling Respiratory: no cough, no shortness of breath Gastrointestinal: no nausea/vomiting/diarrhea Musculoskeletal: no muscle/joint aches Skin: no rashes, no hyperemia Neurological: no tremors, + numbness/tingling to BLE, no dizziness Psychiatric: no depression, no anxiety  Objective:     BP 126/80   Pulse 82   Ht  (1.702 m)   Wt 132 lb (59.9 kg)   BMI 20.67 kg/m   Wt Readings from Last 3 Encounters:  05/24/21 132 lb (59.9 kg)  05/03/21 134 lb 0.6 oz (60.8 kg)  04/18/21 131 lb 12.8 oz (59.8 kg)     BP Readings from Last 3 Encounters:  05/24/21 126/80  04/13/21 (!) 143/80  02/21/21 138/72      Physical Exam- Limited  Constitutional:  Body mass index is 20.67 kg/m. , not in acute distress, normal state of mind Eyes:  EOMI, no exophthalmos Neck: Supple Cardiovascular: RRR, no murmurs, rubs, or gallops, no edema Respiratory: Adequate breathing efforts, no crackles, rales, rhonchi, or wheezing Musculoskeletal: no gross deformities, strength intact in all four extremities, no gross restriction of joint movements Skin:  no rashes, no hyperemia Neurological: no tremor with outstretched hands    Foot exam:  No rashes, ulcers, cuts, calluses, onychodystrophy.  Good pulses bilat. Good sensation to 10 g monofilament bilat.    CMP ( most recent) CMP     Component Value Date/Time   NA 139 04/25/2021 0000   K 3.8 04/25/2021 0000   CL 100 04/25/2021 0000   CO2 21 04/25/2021 0000   GLUCOSE 251 (H)  04/13/2021 1612   BUN 19 04/25/2021 0000   CREATININE 1.0 04/25/2021 0000   CREATININE 0.93 04/13/2021 1612   CREATININE 0.78 09/20/2016 0923   CALCIUM 10.1 04/25/2021 0000   PROT 6.7 07/17/2017 0912   ALBUMIN 4.7 04/25/2021 0000   ALBUMIN 4.4 07/17/2017 0912   AST 18 04/25/2021 0000   ALT 10 04/25/2021 0000   ALKPHOS 57 04/25/2021 0000   BILITOT  0.5 07/17/2017 0912   GFRNONAA >60 04/13/2021 1612   GFRNONAA 77 09/20/2016 0923   GFRAA >60 08/21/2019 1539   GFRAA 88 09/20/2016 0923     Diabetic Labs (most recent): Lab Results  Component Value Date   HGBA1C 9.6 04/25/2021   HGBA1C 9.6 (H) 04/11/2017   HGBA1C 8.5 (H) 01/04/2017     Lipid Panel ( most recent) Lipid Panel  No results found for: CHOL, TRIG, HDL, CHOLHDL, VLDL, LDLCALC, LDLDIRECT, LABVLDL    Lab Results  Component Value Date   TSH 11.00 (A) 04/25/2021   TSH 19.240 (H) 07/17/2017   TSH 1.91 09/20/2016   TSH 3.05 06/14/2016   TSH 2.86 03/12/2016   TSH 4.280 04/04/2012   FREET4 1.53 07/17/2017   FREET4 1.5 09/20/2016   FREET4 1.6 06/14/2016   FREET4 1.5 03/12/2016           Assessment & Plan:   1) Uncontrolled type 2 diabetes mellitus with circulatory disorder, with long-term current use of insulin (HCC)  - Gabrielle Wood has currently uncontrolled symptomatic type 2 DM since 75 years of age.  She presents today with her logs, no meter, showing increasing glucose readings overall.  She saw her PCP since last visit who increased her Tresiba to 18 units nightly and increased her Metformin to 1000 mg po twice daily with meals.  She has not had anymore occasions of hypoglycemia.  She finished cardiac rehab approx 1 month ago.  -Recent labs reviewed.  - I had a long discussion with her about the progressive nature of diabetes and the pathology behind its complications. -her diabetes is complicated by CAD with MI, mild CKD and she remains at a high risk for more acute and chronic complications which  include CAD, CVA, worsening CKD, retinopathy, and neuropathy. These are all discussed in detail with her.  - Nutritional counseling repeated at each appointment due to patients tendency to fall back in to old habits.  - The patient admits there is a room for improvement in their diet and drink choices. -  Suggestion is made for the patient to avoid simple carbohydrates from their diet including Cakes, Sweet Desserts / Pastries, Ice Cream, Soda (diet and regular), Sweet Tea, Candies, Chips, Cookies, Sweet Pastries, Store Bought Juices, Alcohol in Excess of 1-2 drinks a day, Artificial Sweeteners, Coffee Creamer, and "Sugar-free" Products. This will help patient to have stable blood glucose profile and potentially avoid unintended weight gain.   - I encouraged the patient to switch to unprocessed or minimally processed complex starch and increased protein intake (animal or plant source), fruits, and vegetables.   - Patient is advised to stick to a routine mealtimes to eat 3 meals a day and avoid unnecessary snacks (to snack only to correct hypoglycemia).  - she will be scheduled with Norm SaltPenny Crumpton, RDN, CDE for diabetes education.  - I have approached her with the following individualized plan to manage  her diabetes and patient agrees:   -She is advised to increase her Tresiba to 20 units SQ nightly, continue her Humalog 5-8 units TID with meals if glucose is above 90 and she is eating (specific instructions on how to titrate insulin dose based on glucose readings given to patient in writing).  She is also advised to continue Metformin 1000 mg po twice daily with meals.   -she is encouraged to continue monitoring blood glucose 4 times daily, before meals and before bed, and to call the clinic if she has readings less  than 70 or greater than 300 for 3 tests in a row.  - she is warned not to take insulin without proper monitoring per orders. - Adjustment parameters are given to her for hypo and  hyperglycemia in writing.  -She is not a candidate for incretin therapy due to body habitus.  - Specific targets for  A1c;  LDL, HDL,  and Triglycerides were discussed with the patient.  2) Blood Pressure /Hypertension:  her blood pressure is controlled to target.   she is advised to continue her current medications including Valsartan 80 mg p.o. daily with breakfast.  3) Lipids/Hyperlipidemia:    Review of her recent lipid panel from 12/09/2020 showed uncontrolled LDL at 103 .  she is advised to continue Lipitor 40 mg daily at bedtime (recently increased by her PCP).  Side effects and precautions discussed with her.  4)  Weight/Diet:  her Body mass index is 20.67 kg/m.  -  she is not a candidate for weight loss.  Exercise, and detailed carbohydrates information provided  -  detailed on discharge instructions.  5) Hypothyroidism- unspecified The details of her diagnosis are unclear.  Her most recent TSH was 11.  Her PCP increased her dose of Levothyroxine to 175 mcg which is a large dose for her size.  She is advised to continue her current dose, will recheck in 2 months and adjust dose if needed.    6) Chronic Care/Health Maintenance: -she is on ACEI/ARB and Statin medications and is encouraged to initiate and continue to follow up with Ophthalmology, Dentist, Podiatrist at least yearly or according to recommendations, and advised to stay away from smoking. I have recommended yearly flu vaccine and pneumonia vaccine at least every 5 years; moderate intensity exercise for up to 150 minutes weekly; and sleep for at least 7 hours a day.  - she is advised to maintain close follow up with [name], MD for primary care needs, as well as her other providers for optimal and coordinated care.     I spent 40 minutes in the care of the patient today including review of labs from CMP, Lipids, Thyroid Function, Hematology (current and previous including abstractions from other facilities);  face-to-face time discussing  her blood glucose readings/logs, discussing hypoglycemia and hyperglycemia episodes and symptoms, medications doses, her options of short and long term treatment based on the latest standards of care / guidelines;  discussion about incorporating lifestyle medicine;  and documenting the encounter.    Please refer to Patient Instructions for Blood Glucose Monitoring and Insulin/Medications Dosing Guide"  in media tab for additional information. Please  also refer to " Patient Self Inventory" in the Media  tab for reviewed elements of pertinent patient history.  Gabrielle Wood participated in the discussions, expressed understanding, and voiced agreement with the above plans.  All questions were answered to her satisfaction. she is encouraged to contact clinic should she have any questions or concerns prior to her return visit.   Follow up plan: - Return in about 2 months (around 07/24/2021) for Diabetes F/U with A1c in office, Bring meter and logs, Thyroid follow up, Previsit labs.  Ronny Bacon, [facility] [address] [place], [place] [address] Phone: [phone] Fax: [phone]  05/24/2021, 3:55 PM

## 2021-06-15 DIAGNOSIS — E039 Hypothyroidism, unspecified: Secondary | ICD-10-CM | POA: Diagnosis not present

## 2021-06-15 DIAGNOSIS — I10 Essential (primary) hypertension: Secondary | ICD-10-CM | POA: Diagnosis not present

## 2021-06-15 DIAGNOSIS — E1165 Type 2 diabetes mellitus with hyperglycemia: Secondary | ICD-10-CM | POA: Diagnosis not present

## 2021-06-15 DIAGNOSIS — F32A Depression, unspecified: Secondary | ICD-10-CM | POA: Diagnosis not present

## 2021-06-15 DIAGNOSIS — R197 Diarrhea, unspecified: Secondary | ICD-10-CM | POA: Diagnosis not present

## 2021-06-15 DIAGNOSIS — F17210 Nicotine dependence, cigarettes, uncomplicated: Secondary | ICD-10-CM | POA: Diagnosis not present

## 2021-06-15 DIAGNOSIS — Z6820 Body mass index (BMI) 20.0-20.9, adult: Secondary | ICD-10-CM | POA: Diagnosis not present

## 2021-06-27 ENCOUNTER — Encounter: Payer: Self-pay | Admitting: Internal Medicine

## 2021-06-27 DIAGNOSIS — F17210 Nicotine dependence, cigarettes, uncomplicated: Secondary | ICD-10-CM | POA: Diagnosis not present

## 2021-06-27 DIAGNOSIS — Z6820 Body mass index (BMI) 20.0-20.9, adult: Secondary | ICD-10-CM | POA: Diagnosis not present

## 2021-06-27 DIAGNOSIS — R110 Nausea: Secondary | ICD-10-CM | POA: Diagnosis not present

## 2021-06-27 DIAGNOSIS — R1031 Right lower quadrant pain: Secondary | ICD-10-CM | POA: Diagnosis not present

## 2021-06-27 DIAGNOSIS — R1011 Right upper quadrant pain: Secondary | ICD-10-CM | POA: Diagnosis not present

## 2021-06-28 DIAGNOSIS — R109 Unspecified abdominal pain: Secondary | ICD-10-CM | POA: Diagnosis not present

## 2021-06-28 DIAGNOSIS — R110 Nausea: Secondary | ICD-10-CM | POA: Diagnosis not present

## 2021-06-28 DIAGNOSIS — R6889 Other general symptoms and signs: Secondary | ICD-10-CM | POA: Diagnosis not present

## 2021-06-28 DIAGNOSIS — Z743 Need for continuous supervision: Secondary | ICD-10-CM | POA: Diagnosis not present

## 2021-06-28 DIAGNOSIS — I2510 Atherosclerotic heart disease of native coronary artery without angina pectoris: Secondary | ICD-10-CM | POA: Diagnosis not present

## 2021-06-28 DIAGNOSIS — N281 Cyst of kidney, acquired: Secondary | ICD-10-CM | POA: Diagnosis not present

## 2021-06-28 DIAGNOSIS — I700 Atherosclerosis of aorta: Secondary | ICD-10-CM | POA: Diagnosis not present

## 2021-06-28 DIAGNOSIS — R748 Abnormal levels of other serum enzymes: Secondary | ICD-10-CM | POA: Diagnosis not present

## 2021-06-28 DIAGNOSIS — Z87891 Personal history of nicotine dependence: Secondary | ICD-10-CM | POA: Diagnosis not present

## 2021-06-28 DIAGNOSIS — E079 Disorder of thyroid, unspecified: Secondary | ICD-10-CM | POA: Diagnosis not present

## 2021-06-28 DIAGNOSIS — I10 Essential (primary) hypertension: Secondary | ICD-10-CM | POA: Diagnosis not present

## 2021-06-28 DIAGNOSIS — E119 Type 2 diabetes mellitus without complications: Secondary | ICD-10-CM | POA: Diagnosis not present

## 2021-06-28 DIAGNOSIS — E785 Hyperlipidemia, unspecified: Secondary | ICD-10-CM | POA: Diagnosis not present

## 2021-06-28 DIAGNOSIS — R197 Diarrhea, unspecified: Secondary | ICD-10-CM | POA: Diagnosis not present

## 2021-06-28 DIAGNOSIS — R1084 Generalized abdominal pain: Secondary | ICD-10-CM | POA: Diagnosis not present

## 2021-06-28 DIAGNOSIS — Z7982 Long term (current) use of aspirin: Secondary | ICD-10-CM | POA: Diagnosis not present

## 2021-06-28 DIAGNOSIS — N3000 Acute cystitis without hematuria: Secondary | ICD-10-CM | POA: Diagnosis not present

## 2021-07-24 DIAGNOSIS — M79661 Pain in right lower leg: Secondary | ICD-10-CM | POA: Diagnosis not present

## 2021-07-24 DIAGNOSIS — E782 Mixed hyperlipidemia: Secondary | ICD-10-CM | POA: Diagnosis not present

## 2021-07-24 DIAGNOSIS — E559 Vitamin D deficiency, unspecified: Secondary | ICD-10-CM | POA: Diagnosis not present

## 2021-07-24 DIAGNOSIS — E039 Hypothyroidism, unspecified: Secondary | ICD-10-CM | POA: Diagnosis not present

## 2021-07-24 DIAGNOSIS — E1159 Type 2 diabetes mellitus with other circulatory complications: Secondary | ICD-10-CM | POA: Diagnosis not present

## 2021-07-24 DIAGNOSIS — E7800 Pure hypercholesterolemia, unspecified: Secondary | ICD-10-CM | POA: Diagnosis not present

## 2021-07-24 DIAGNOSIS — M79604 Pain in right leg: Secondary | ICD-10-CM | POA: Diagnosis not present

## 2021-07-24 DIAGNOSIS — Z794 Long term (current) use of insulin: Secondary | ICD-10-CM | POA: Diagnosis not present

## 2021-07-24 DIAGNOSIS — E7849 Other hyperlipidemia: Secondary | ICD-10-CM | POA: Diagnosis not present

## 2021-07-25 LAB — TSH: TSH: 1.62 (ref 0.41–5.90)

## 2021-07-26 ENCOUNTER — Ambulatory Visit: Payer: Medicare Other | Admitting: Nurse Practitioner

## 2021-07-27 ENCOUNTER — Ambulatory Visit: Payer: Medicare Other | Admitting: Nurse Practitioner

## 2021-08-15 ENCOUNTER — Encounter: Payer: Self-pay | Admitting: Internal Medicine

## 2021-08-15 DIAGNOSIS — R197 Diarrhea, unspecified: Secondary | ICD-10-CM | POA: Diagnosis not present

## 2021-08-15 DIAGNOSIS — I10 Essential (primary) hypertension: Secondary | ICD-10-CM | POA: Diagnosis not present

## 2021-08-15 DIAGNOSIS — E1165 Type 2 diabetes mellitus with hyperglycemia: Secondary | ICD-10-CM | POA: Diagnosis not present

## 2021-08-15 DIAGNOSIS — R1011 Right upper quadrant pain: Secondary | ICD-10-CM | POA: Diagnosis not present

## 2021-08-15 DIAGNOSIS — R1031 Right lower quadrant pain: Secondary | ICD-10-CM | POA: Diagnosis not present

## 2021-08-15 DIAGNOSIS — E039 Hypothyroidism, unspecified: Secondary | ICD-10-CM | POA: Diagnosis not present

## 2021-08-15 LAB — HEMOGLOBIN A1C: Hemoglobin A1C: 8.9

## 2021-08-17 ENCOUNTER — Encounter: Payer: Self-pay | Admitting: Nurse Practitioner

## 2021-08-17 ENCOUNTER — Ambulatory Visit: Payer: Medicare Other | Admitting: Nurse Practitioner

## 2021-08-17 ENCOUNTER — Other Ambulatory Visit: Payer: Self-pay

## 2021-08-17 VITALS — BP 95/62 | HR 83 | Ht 67.0 in | Wt 127.0 lb

## 2021-08-17 DIAGNOSIS — E1165 Type 2 diabetes mellitus with hyperglycemia: Secondary | ICD-10-CM | POA: Diagnosis not present

## 2021-08-17 DIAGNOSIS — IMO0002 Reserved for concepts with insufficient information to code with codable children: Secondary | ICD-10-CM

## 2021-08-17 DIAGNOSIS — E559 Vitamin D deficiency, unspecified: Secondary | ICD-10-CM | POA: Diagnosis not present

## 2021-08-17 DIAGNOSIS — E039 Hypothyroidism, unspecified: Secondary | ICD-10-CM

## 2021-08-17 DIAGNOSIS — E1159 Type 2 diabetes mellitus with other circulatory complications: Secondary | ICD-10-CM | POA: Diagnosis not present

## 2021-08-17 DIAGNOSIS — Z794 Long term (current) use of insulin: Secondary | ICD-10-CM

## 2021-08-17 MED ORDER — LEVOTHYROXINE SODIUM 125 MCG PO TABS: 125.0000 ug | ORAL_TABLET | Freq: Every day | ORAL | 1 refills | Status: DC

## 2021-08-17 NOTE — Progress Notes (Signed)
Endocrinology Follow Up Note       08/17/2021, 1:34 PM   Subjective:    Patient ID: Gabrielle Wood, female    DOB: 1946/09/19.  Gabrielle Wood is being seen in follow up after being seen in consultation for management of currently uncontrolled symptomatic diabetes requested by  [name], MD.   Past Medical History:  Diagnosis Date   Arthritis    COPD (chronic obstructive pulmonary disease) (HCC)    Diabetes mellitus    DVT (deep venous thrombosis) (HCC)    years ago   GERD (gastroesophageal reflux disease)    High cholesterol    Hypertension    Hypothyroidism    Thyroid disease     Past Surgical History:  Procedure Laterality Date   BACK SURGERY     lumbar x3    ESOPHAGOGASTRODUODENOSCOPY  04/04/2012   Moderate gastritis/MODERATE Duodenitis in the duodenal bulb duodenum   EUS  05/14/2012   Normal pancreas, gallbladder,biliary tree/ Incidental finding of simple-appearing right renal cyst.   TONSILLECTOMY     TUBAL LIGATION     VEIN SURGERY     stripping years ago but recent laser surgery; bilateral legs    Social History   Socioeconomic History   Marital status: Widowed    Spouse name: Not on file   Number of children: 2   Years of education: Not on file   Highest education level: Not on file  Occupational History   Occupation: retired, Advertising copywriter at [employer]  Tobacco Use   Smoking status: Former    Packs/day: 0.50    Types: Cigarettes   Smokeless tobacco: Never  Vaping Use   Vaping Use: Never used  Substance and Sexual Activity   Alcohol use: No   Drug use: No   Sexual activity: Not on file  Other Topics Concern   Not on file  Social History Narrative   Not on file   Social Determinants of Health   Financial Resource Strain: Not on file  Food Insecurity: Not on file  Transportation Needs: Not on file  Physical Activity: Not on file  Stress: Not on file  Social  Connections: Not on file    Family History  Problem Relation Age of Onset   Pancreatic cancer Mother        diagnosed at age 15s, died from heart attack   Inflammatory bowel disease Brother        crohn's, his dgt deceased from colon cancer at age 52   Colon cancer Neg Hx     Outpatient Encounter Medications as of 08/17/2021  Medication Sig   ALPRAZolam (XANAX) 1 MG tablet Take 0.5-1 mg by mouth. 0.5 tablet in am and 1 tab at night   amLODipine (NORVASC) 5 MG tablet Take 5 mg by mouth daily.   aspirin EC 81 MG tablet Take 81 mg by mouth daily. Swallow whole.   atorvastatin (LIPITOR) 80 MG tablet Take 80 mg by mouth at bedtime.   baclofen (LIORESAL) 10 MG tablet Take 1 tablet by mouth 2 (two) times daily as needed.   EASY COMFORT PEN NEEDLES 31G X 8 MM MISC AS DIRECTED FOUR TIMES DAILY   gabapentin (  NEURONTIN) 300 MG capsule Take 300 mg by mouth 3 (three) times daily.   IBU 800 MG tablet Take 800 mg by mouth 3 (three) times daily as needed.   insulin lispro (HUMALOG) 100 UNIT/ML KwikPen Inject 5-8 Units into the skin 3 (three) times daily.   Lancets (ONETOUCH DELICA PLUS LANCET33G) MISC Apply topically 4 (four) times daily.   levocetirizine (XYZAL) 5 MG tablet Take 1 tablet (5 mg total) by mouth every evening.   levothyroxine (SYNTHROID) 125 MCG tablet Take 1 tablet (125 mcg total) by mouth daily.   loperamide (IMODIUM) 2 MG capsule Take 2 mg by mouth 4 (four) times daily as needed.   metFORMIN (GLUCOPHAGE) 500 MG tablet Take 2 tablets (1,000 mg total) by mouth 2 (two) times daily with a meal. 2 tabs bid   metoprolol succinate (TOPROL-XL) 25 MG 24 hr tablet Take 1 tablet by mouth every evening.   nitroGLYCERIN (NITROSTAT) 0.4 MG SL tablet Place 0.4 mg under the tongue every 5 (five) minutes as needed for chest pain.   ondansetron (ZOFRAN-ODT) 4 MG disintegrating tablet Take 4 mg by mouth.   ONETOUCH ULTRA test strip 4 (four) times daily.   pantoprazole (PROTONIX) 40 MG tablet Take 40 mg  by mouth daily.   ticagrelor (BRILINTA) 90 MG TABS tablet Take 90 mg by mouth 2 (two) times daily.   TRESIBA FLEXTOUCH 100 UNIT/ML FlexTouch Pen Inject 20 Units into the skin at bedtime.   valsartan (DIOVAN) 80 MG tablet Take 80 mg by mouth daily.   [DISCONTINUED] HUMALOG KWIKPEN 200 UNIT/ML SOPN Inject 5-8 Units into the skin 3 (three) times daily. If glucose is above 90 and she is eating   [DISCONTINUED] levothyroxine (SYNTHROID) 150 MCG tablet Take 150 mcg by mouth daily.   [DISCONTINUED] TRESIBA FLEXTOUCH 200 UNIT/ML SOPN Inject 20 Units into the skin at bedtime.   [DISCONTINUED] atorvastatin (LIPITOR) 40 MG tablet Take 40 mg by mouth every evening. (Patient not taking: Reported on 08/17/2021)   [DISCONTINUED] fexofenadine (ALLEGRA) 60 MG tablet Take by mouth.   [DISCONTINUED] gabapentin (NEURONTIN) 100 MG capsule 2 capsules twice a day, 3 capsules at night (Patient not taking: Reported on 08/17/2021)   [DISCONTINUED] levothyroxine (SYNTHROID, LEVOTHROID) 137 MCG tablet Take 137 mcg by mouth daily before breakfast. (Patient not taking: No sig reported)   No facility-administered encounter medications on file as of 08/17/2021.    ALLERGIES: Allergies  Allergen Reactions   Ciprocin-Fluocin-Procin [Fluocinolone]     Nightmares   Lipitor [Atorvastatin] Other (See Comments)    Muscle aches, pain   Lisinopril Cough   Red Yeast Rice [Cholestin] Itching and Rash    VACCINATION STATUS: Immunization History  Administered Date(s) Administered   Pneumococcal Polysaccharide-23 04/04/2012    Diabetes She presents for her follow-up diabetic visit. She has type 2 diabetes mellitus. Onset time: Was diagnosed at approx age of 45. Her disease course has been worsening. Hypoglycemia symptoms include sweats. Pertinent negatives for hypoglycemia include no tremors. Associated symptoms include fatigue, foot paresthesias and weight loss. Pertinent negatives for diabetes include no polydipsia and no  polyuria. There are no hypoglycemic complications. Symptoms are stable. Diabetic complications include heart disease and peripheral neuropathy. (Had a MI in the past) Risk factors for coronary artery disease include diabetes mellitus, dyslipidemia, hypertension, post-menopausal and sedentary lifestyle. Current diabetic treatment includes intensive insulin program and oral agent (monotherapy). She is compliant with treatment most of the time. Her weight is decreasing steadily. She is following a generally healthy diet. When asked  about meal planning, she reported none. She has not had a previous visit with a dietitian. She participates in exercise intermittently. Her home blood glucose trend is increasing steadily. Her breakfast blood glucose range is generally 130-140 mg/dl. Her lunch blood glucose range is generally 110-130 mg/dl. Her dinner blood glucose range is generally 130-140 mg/dl. Her bedtime blood glucose range is generally >200 mg/dl. (She presents today with her logs, no meter, showing fluctuating glycemic profile overall.  Her previsit A1c was 8.9% on 9/13, improving from last visit of 9.6%.  She does have some mild episodes of hypoglycemia likely due to inadequate intake.  She says she does not have much of an appetite.) An ACE inhibitor/angiotensin II receptor blocker is being taken. She does not see a podiatrist.Eye exam is current.  Hyperlipidemia This is a chronic problem. The current episode started more than 1 year ago. The problem is uncontrolled. Recent lipid tests were reviewed and are high. Exacerbating diseases include chronic renal disease, diabetes and hypothyroidism. There are no known factors aggravating her hyperlipidemia. Current antihyperlipidemic treatment includes statins. The current treatment provides mild improvement of lipids. Compliance problems include adherence to diet and adherence to exercise.  Risk factors for coronary artery disease include diabetes mellitus,  dyslipidemia, hypertension, post-menopausal and a sedentary lifestyle.  Hypertension This is a chronic problem. The current episode started more than 1 year ago. The problem has been resolved since onset. The problem is controlled. Associated symptoms include sweats. Agents associated with hypertension include thyroid hormones. Risk factors for coronary artery disease include diabetes mellitus, dyslipidemia, post-menopausal state and sedentary lifestyle. Past treatments include angiotensin blockers and calcium channel blockers. There are no compliance problems.  Hypertensive end-organ damage includes kidney disease and CAD/MI. Identifiable causes of hypertension include chronic renal disease and a thyroid problem.    Review of systems  Constitutional: + steadily decreasing body weight,  current Body mass index is 19.89 kg/m. , no fatigue, no subjective hyperthermia, no subjective hypothermia Eyes: no blurry vision, no xerophthalmia ENT: no sore throat, no nodules palpated in throat, no dysphagia/odynophagia, no hoarseness Cardiovascular: no chest pain, no shortness of breath, no palpitations, no leg swelling Respiratory: no cough, no shortness of breath Gastrointestinal: no nausea/vomiting/diarrhea Musculoskeletal: no muscle/joint aches Skin: no rashes, no hyperemia Neurological: no tremors, + numbness/tingling to BLE, no dizziness Psychiatric: no depression, no anxiety, feels jittery at times  Objective:     BP 95/62   Pulse 83   Ht 5\' 7"  (1.702 m)   Wt 127 lb (57.6 kg)   BMI 19.89 kg/m   Wt Readings from Last 3 Encounters:  08/17/21 127 lb (57.6 kg)  05/24/21 132 lb (59.9 kg)  05/03/21 134 lb 0.6 oz (60.8 kg)     BP Readings from Last 3 Encounters:  08/17/21 95/62  05/24/21 126/80  04/13/21 (!) 143/80      Physical Exam- Limited  Constitutional:  Body mass index is 19.89 kg/m. , not in acute distress, normal state of mind Eyes:  EOMI, no exophthalmos Neck:  Supple Cardiovascular: RRR, no murmurs, rubs, or gallops, no edema Respiratory: Adequate breathing efforts, no crackles, rales, rhonchi, or wheezing Musculoskeletal: no gross deformities, strength intact in all four extremities, no gross restriction of joint movements, walks with cane Skin:  no rashes, no hyperemia Neurological: no tremor with outstretched hands      CMP ( most recent) CMP     Component Value Date/Time   NA 139 04/25/2021 0000   K 3.8 04/25/2021 0000  CL 100 04/25/2021 0000   CO2 21 04/25/2021 0000   GLUCOSE 251 (H) 04/13/2021 1612   BUN 19 04/25/2021 0000   CREATININE 1.0 04/25/2021 0000   CREATININE 0.93 04/13/2021 1612   CREATININE 0.78 09/20/2016 0923   CALCIUM 10.1 04/25/2021 0000   PROT 6.7 07/17/2017 0912   ALBUMIN 4.7 04/25/2021 0000   ALBUMIN 4.4 07/17/2017 0912   AST 18 04/25/2021 0000   ALT 10 04/25/2021 0000   ALKPHOS 57 04/25/2021 0000   BILITOT 0.5 07/17/2017 0912   GFRNONAA >60 04/13/2021 1612   GFRNONAA 77 09/20/2016 0923   GFRAA >60 08/21/2019 1539   GFRAA 88 09/20/2016 0923     Diabetic Labs (most recent): Lab Results  Component Value Date   HGBA1C 8.9 08/15/2021   HGBA1C 9.6 04/25/2021   HGBA1C 9.6 (H) 04/11/2017     Lipid Panel ( most recent) Lipid Panel  No results found for: CHOL, TRIG, HDL, CHOLHDL, VLDL, LDLCALC, LDLDIRECT, LABVLDL    Lab Results  Component Value Date   TSH 1.62 07/24/2021   TSH 11.00 (A) 04/25/2021   TSH 19.240 (H) 07/17/2017   TSH 1.91 09/20/2016   TSH 3.05 06/14/2016   TSH 2.86 03/12/2016   TSH 4.280 04/04/2012   FREET4 1.53 07/17/2017   FREET4 1.5 09/20/2016   FREET4 1.6 06/14/2016   FREET4 1.5 03/12/2016           Assessment & Plan:   1) Uncontrolled type 2 diabetes mellitus with circulatory disorder, with long-term current use of insulin (HCC)  - Gabrielle Wood has currently uncontrolled symptomatic type 2 DM since 75 years of age.  She presents today with her logs, no  meter, showing fluctuating glycemic profile overall.  Her previsit A1c was 8.9% on 9/13, improving from last visit of 9.6%.  She does have some mild episodes of hypoglycemia likely due to inadequate intake.  She says she does not have much of an appetite.  -Recent labs reviewed.  - I had a long discussion with her about the progressive nature of diabetes and the pathology behind its complications. 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 These are all discussed in detail with her.  - Nutritional counseling repeated at each appointment due to patients tendency to fall back in to old habits.  - The patient admits there is a room for improvement in their diet and drink choices. -  Suggestion is made for the patient to avoid simple carbohydrates from their diet including Cakes, Sweet Desserts / Pastries, Ice Cream, Soda (diet and regular), Sweet Tea, Candies, Chips, Cookies, Sweet Pastries, Store Bought Juices, Alcohol in Excess of 1-2 drinks a day, Artificial Sweeteners, Coffee Creamer, and "Sugar-free" Products. This will help patient to have stable blood glucose profile and potentially avoid unintended weight gain.   - I encouraged the patient to switch to unprocessed or minimally processed complex starch and increased protein intake (animal or plant source), fruits, and vegetables.   - Patient is advised to stick to a routine mealtimes to eat 3 meals a day and avoid unnecessary snacks (to snack only to correct hypoglycemia).  - she will be scheduled with Norm Salt, RDN, CDE for diabetes education.  - I have approached her with the following individualized plan to manage  her diabetes and patient agrees:   -She is advised to continue her  Tresiba to 20 units SQ nightly, continue her Humalog 5-8 units TID with meals if glucose is above 90 and  she is eating (specific instructions on how to titrate insulin dose based on glucose readings given to patient in writing).  She is also advised to continue Metformin 1000 mg po twice daily with meals.  Staying at the same dose of insulin may have a positive effect on her weight (hopefully gaining soon).  -she is encouraged to continue monitoring blood glucose 4 times daily, before meals and before bed, and to call the clinic if she has readings less than 70 or greater than 300 for 3 tests in a row.  - she is warned not to take insulin without proper monitoring per orders. - Adjustment parameters are given to her for hypo and hyperglycemia in writing.  -She is not a candidate for incretin therapy due to body habitus.  - Specific targets for  A1c;  LDL, HDL,  and Triglycerides were discussed with the patient.  2) Blood Pressure /Hypertension:  her blood pressure is controlled to target.   she is advised to continue her current medications including Valsartan 80 mg p.o. daily with breakfast.  3) Lipids/Hyperlipidemia:    Review of her recent lipid panel from 12/09/2020 showed uncontrolled LDL at 103 .  she is advised to continue Lipitor 40 mg daily at bedtime (recently increased by her PCP).  Side effects and precautions discussed with her.  Will recheck lipid panel prior to next visit.  4)  Weight/Diet:  her Body mass index is 19.89 kg/m.  -  she is not a candidate for weight loss.  Exercise, and detailed carbohydrates information provided  -  detailed on discharge instructions.  5) Hypothyroidism- unspecified Her previsit thyroid function tests are consistent with over-replacement.  This could be a contributing factor to her unexplained weight loss.  She is advised to lower her Levothyroxine to 125 mcg po daily before breakfast.    6) Chronic Care/Health Maintenance: -she is on ACEI/ARB and Statin medications and is encouraged to initiate and continue to follow up with Ophthalmology, Dentist,  Podiatrist at least yearly or according to recommendations, and advised to stay away from smoking. I have recommended yearly flu vaccine and pneumonia vaccine at least every 5 years; moderate intensity exercise for up to 150 minutes weekly; and sleep for at least 7 hours a day.  - she is advised to maintain close follow up with [name], MD for primary care needs, as well as her other providers for optimal and coordinated care.      I spent 32 minutes in the care of the patient today including review of labs from CMP, Lipids, Thyroid Function, Hematology (current and previous including abstractions from other facilities); face-to-face time discussing  her blood glucose readings/logs, discussing hypoglycemia and hyperglycemia episodes and symptoms, medications doses, her options of short and long term treatment based on the latest standards of care / guidelines;  discussion about incorporating lifestyle medicine;  and documenting the encounter.    Please refer to Patient Instructions for Blood Glucose Monitoring and Insulin/Medications Dosing Guide"  in media tab for additional information. Please  also refer to " Patient Self Inventory" in the Media  tab for reviewed elements of pertinent patient history.  Gabrielle Wood participated in the discussions, expressed understanding, and voiced agreement with the above plans.  All questions were answered to her satisfaction. she is encouraged to contact clinic should she have any questions or concerns  prior to her return visit.   Follow up plan: - Return in about 3 months (around 11/16/2021) for Diabetes F/U with A1c in office, Previsit labs, Bring meter and logs.  Ronny Bacon, [facility] [address] [place], [place] [address] Phone: [phone] Fax: [phone]  08/17/2021, 1:34 PM

## 2021-08-17 NOTE — Patient Instructions (Signed)

## 2021-09-05 ENCOUNTER — Encounter: Payer: Self-pay | Admitting: Gastroenterology

## 2021-09-05 DIAGNOSIS — I214 Non-ST elevation (NSTEMI) myocardial infarction: Secondary | ICD-10-CM | POA: Diagnosis not present

## 2021-09-05 DIAGNOSIS — R1030 Lower abdominal pain, unspecified: Secondary | ICD-10-CM | POA: Diagnosis not present

## 2021-09-05 DIAGNOSIS — E1165 Type 2 diabetes mellitus with hyperglycemia: Secondary | ICD-10-CM | POA: Diagnosis not present

## 2021-09-05 DIAGNOSIS — R634 Abnormal weight loss: Secondary | ICD-10-CM | POA: Diagnosis not present

## 2021-09-05 DIAGNOSIS — Z794 Long term (current) use of insulin: Secondary | ICD-10-CM | POA: Diagnosis not present

## 2021-09-05 DIAGNOSIS — Z1231 Encounter for screening mammogram for malignant neoplasm of breast: Secondary | ICD-10-CM | POA: Diagnosis not present

## 2021-09-05 NOTE — Progress Notes (Deleted)
Referring Provider: [name], MD Primary Care Physician:  [name], MD Primary GI Physician: Dr. Bonnetta Barry chief complaint on file.   HPI:   Gabrielle Wood is a 75 y.o. female presenting today with a history of hypothyroidism, HTN, HLD, diabetes, COPD, GERD, H. pylori gastritis/duodenitis s/p treatment in 2013 without documented eradication, DVT years ago, NSTEMI s/p placement of drug-eluting stent January 2022 on aspirin and Brilinta, presenting today at [facility] request [facility] for unintentional weight loss.  Previously reported colonoscopy around 2011, possibly at [facility], but no records in our system.  Also previously reported history of colon polyps.  Had plan for procedure in 2019, but patient canceled.  Last seen in our office 02/01/2021 for GERD and history of colon polyps.  She was actually initially referred due to diarrhea, but this had completely resolved.  Denied melena, hematochezia, unintentional weight loss.  She recently had NSTEMI s/p PCI January 2022 on aspirin and Brilinta.  Recommended delaying surveillance colonoscopy for 1 year.  Reviewed recent labs completed 09/05/2021: CBC essentially normal.  CMP remarkable for glucose 289 (H), calcium 10.4 (H).  Hemoglobin A1c 8.6 (H).  HIV negative.  Today:   Past Medical History:  Diagnosis Date   Arthritis    COPD (chronic obstructive pulmonary disease) (HCC)    Diabetes mellitus    DVT (deep venous thrombosis) (HCC)    years ago   GERD (gastroesophageal reflux disease)    High cholesterol    Hypertension    Hypothyroidism    NSTEMI (non-ST elevated myocardial infarction) (HCC) 12/2020   s/p  PCI DES to the LCx   Thyroid disease     Past Surgical History:  Procedure Laterality Date   BACK SURGERY     lumbar x3    ESOPHAGOGASTRODUODENOSCOPY  04/04/2012   Moderate gastritis/MODERATE Duodenitis in the duodenal bulb duodenum   EUS  05/14/2012   Normal pancreas,  gallbladder,biliary tree/ Incidental finding of simple-appearing right renal cyst.   TONSILLECTOMY     TUBAL LIGATION     VEIN SURGERY     stripping years ago but recent laser surgery; bilateral legs    Current Outpatient Medications  Medication Sig Dispense Refill   ALPRAZolam (XANAX) 1 MG tablet Take 0.5-1 mg by mouth. 0.5 tablet in am and 1 tab at night     amLODipine (NORVASC) 5 MG tablet Take 5 mg by mouth daily.     aspirin EC 81 MG tablet Take 81 mg by mouth daily. Swallow whole.     atorvastatin (LIPITOR) 80 MG tablet Take 80 mg by mouth at bedtime.     baclofen (LIORESAL) 10 MG tablet Take 1 tablet by mouth 2 (two) times daily as needed.  2   EASY COMFORT PEN NEEDLES 31G X 8 MM MISC AS DIRECTED FOUR TIMES DAILY     gabapentin (NEURONTIN) 300 MG capsule Take 300 mg by mouth 3 (three) times daily.     IBU 800 MG tablet Take 800 mg by mouth 3 (three) times daily as needed.     insulin lispro (HUMALOG) 100 UNIT/ML KwikPen Inject 5-8 Units into the skin 3 (three) times daily.     Lancets (ONETOUCH DELICA PLUS LANCET33G) MISC Apply topically 4 (four) times daily.     levocetirizine (XYZAL) 5 MG tablet Take 1 tablet (5 mg total) by mouth every evening. 30 tablet 1   levothyroxine (SYNTHROID) 125 MCG tablet Take 1 tablet (125 mcg total) by  mouth daily. 90 tablet 1   loperamide (IMODIUM) 2 MG capsule Take 2 mg by mouth 4 (four) times daily as needed.     metFORMIN (GLUCOPHAGE) 500 MG tablet Take 2 tablets (1,000 mg total) by mouth 2 (two) times daily with a meal. 2 tabs bid     metoprolol succinate (TOPROL-XL) 25 MG 24 hr tablet Take 1 tablet by mouth every evening.     nitroGLYCERIN (NITROSTAT) 0.4 MG SL tablet Place 0.4 mg under the tongue every 5 (five) minutes as needed for chest pain.     ondansetron (ZOFRAN-ODT) 4 MG disintegrating tablet Take 4 mg by mouth.     ONETOUCH ULTRA test strip 4 (four) times daily.     pantoprazole (PROTONIX) 40 MG tablet Take 40 mg by mouth daily.      ticagrelor (BRILINTA) 90 MG TABS tablet Take 90 mg by mouth 2 (two) times daily.     TRESIBA FLEXTOUCH 100 UNIT/ML FlexTouch Pen Inject 20 Units into the skin at bedtime.     valsartan (DIOVAN) 80 MG tablet Take 80 mg by mouth daily.     No current facility-administered medications for this visit.    Allergies as of 09/06/2021 - Review Complete 08/17/2021  Allergen Reaction Noted   Ciprocin-fluocin-procin [fluocinolone]  09/26/2015   Lipitor [atorvastatin] Other (See Comments) 04/03/2012   Lisinopril Cough 06/09/2015   Red yeast rice [cholestin] Itching and Rash 04/03/2012    Family History  Problem Relation Age of Onset   Pancreatic cancer Mother        diagnosed at age 48s, died from heart attack   Inflammatory bowel disease Brother        crohn's, his dgt deceased from colon cancer at age 21   Colon cancer Neg Hx     Social History   Socioeconomic History   Marital status: Widowed    Spouse name: Not on file   Number of children: 2   Years of education: Not on file   Highest education level: Not on file  Occupational History   Occupation: retired, Advertising copywriter at [employer]  Tobacco Use   Smoking status: Former    Packs/day: 0.50    Types: Cigarettes   Smokeless tobacco: Never  Vaping Use   Vaping Use: Never used  Substance and Sexual Activity   Alcohol use: No   Drug use: No   Sexual activity: Not on file  Other Topics Concern   Not on file  Social History Narrative   Not on file   Social Determinants of Health   Financial Resource Strain: Not on file  Food Insecurity: Not on file  Transportation Needs: Not on file  Physical Activity: Not on file  Stress: Not on file  Social Connections: Not on file    Review of Systems: Gen: Denies fever, chills, anorexia. Denies fatigue, weakness, weight loss.  CV: Denies chest pain, palpitations, syncope, peripheral edema, and claudication. Resp: Denies dyspnea at rest, cough, wheezing, coughing up blood, and  pleurisy. GI: Denies vomiting blood, jaundice, and fecal incontinence.   Denies dysphagia or odynophagia. Derm: Denies rash, itching, dry skin Psych: Denies depression, anxiety, memory loss, confusion. No homicidal or suicidal ideation.  Heme: Denies bruising, bleeding, and enlarged lymph nodes.  Physical Exam: There were no vitals taken for this visit. General:   Alert and oriented. No distress noted. Pleasant and cooperative.  Head:  Normocephalic and atraumatic. Eyes:  Conjuctiva clear without scleral icterus. Mouth:  Oral mucosa pink and moist. Good dentition.  No lesions. Heart:  S1, S2 present without murmurs appreciated. Lungs:  Clear to auscultation bilaterally. No wheezes, rales, or rhonchi. No distress.  Abdomen:  +BS, soft, non-tender and non-distended. No rebound or guarding. No HSM or masses noted. Msk:  Symmetrical without gross deformities. Normal posture. Extremities:  Without edema. Neurologic:  Alert and  oriented x4 Psych:  Alert and cooperative. Normal mood and affect.

## 2021-09-06 ENCOUNTER — Ambulatory Visit: Payer: Medicare Other | Admitting: Gastroenterology

## 2021-09-06 ENCOUNTER — Encounter: Payer: Self-pay | Admitting: Internal Medicine

## 2021-09-13 DIAGNOSIS — I10 Essential (primary) hypertension: Secondary | ICD-10-CM | POA: Diagnosis not present

## 2021-09-13 DIAGNOSIS — R1031 Right lower quadrant pain: Secondary | ICD-10-CM | POA: Diagnosis not present

## 2021-09-13 DIAGNOSIS — E039 Hypothyroidism, unspecified: Secondary | ICD-10-CM | POA: Diagnosis not present

## 2021-09-13 DIAGNOSIS — R1011 Right upper quadrant pain: Secondary | ICD-10-CM | POA: Diagnosis not present

## 2021-09-13 DIAGNOSIS — E1165 Type 2 diabetes mellitus with hyperglycemia: Secondary | ICD-10-CM | POA: Diagnosis not present

## 2021-09-13 DIAGNOSIS — F17210 Nicotine dependence, cigarettes, uncomplicated: Secondary | ICD-10-CM | POA: Diagnosis not present

## 2021-09-13 DIAGNOSIS — R110 Nausea: Secondary | ICD-10-CM | POA: Diagnosis not present

## 2021-09-18 DIAGNOSIS — N281 Cyst of kidney, acquired: Secondary | ICD-10-CM | POA: Diagnosis not present

## 2021-09-18 DIAGNOSIS — R634 Abnormal weight loss: Secondary | ICD-10-CM | POA: Diagnosis not present

## 2021-09-18 DIAGNOSIS — F17200 Nicotine dependence, unspecified, uncomplicated: Secondary | ICD-10-CM | POA: Diagnosis not present

## 2021-09-18 DIAGNOSIS — I2510 Atherosclerotic heart disease of native coronary artery without angina pectoris: Secondary | ICD-10-CM | POA: Diagnosis not present

## 2021-09-18 DIAGNOSIS — R918 Other nonspecific abnormal finding of lung field: Secondary | ICD-10-CM | POA: Diagnosis not present

## 2021-09-18 DIAGNOSIS — E0789 Other specified disorders of thyroid: Secondary | ICD-10-CM | POA: Diagnosis not present

## 2021-09-18 DIAGNOSIS — R1030 Lower abdominal pain, unspecified: Secondary | ICD-10-CM | POA: Diagnosis not present

## 2021-09-27 DIAGNOSIS — R197 Diarrhea, unspecified: Secondary | ICD-10-CM | POA: Diagnosis not present

## 2021-09-27 DIAGNOSIS — R634 Abnormal weight loss: Secondary | ICD-10-CM | POA: Diagnosis not present

## 2021-09-29 DIAGNOSIS — M5416 Radiculopathy, lumbar region: Secondary | ICD-10-CM | POA: Diagnosis not present

## 2021-09-29 DIAGNOSIS — E039 Hypothyroidism, unspecified: Secondary | ICD-10-CM | POA: Diagnosis not present

## 2021-09-29 DIAGNOSIS — R197 Diarrhea, unspecified: Secondary | ICD-10-CM | POA: Diagnosis not present

## 2021-09-29 DIAGNOSIS — Z6820 Body mass index (BMI) 20.0-20.9, adult: Secondary | ICD-10-CM | POA: Diagnosis not present

## 2021-09-29 DIAGNOSIS — I10 Essential (primary) hypertension: Secondary | ICD-10-CM | POA: Diagnosis not present

## 2021-09-29 DIAGNOSIS — E1165 Type 2 diabetes mellitus with hyperglycemia: Secondary | ICD-10-CM | POA: Diagnosis not present

## 2021-09-29 DIAGNOSIS — F32A Depression, unspecified: Secondary | ICD-10-CM | POA: Diagnosis not present

## 2021-10-02 ENCOUNTER — Ambulatory Visit: Payer: Medicare Other | Admitting: Urology

## 2021-10-02 ENCOUNTER — Encounter: Payer: Self-pay | Admitting: Urology

## 2021-10-02 ENCOUNTER — Other Ambulatory Visit: Payer: Self-pay

## 2021-10-02 VITALS — BP 115/70 | HR 84

## 2021-10-02 DIAGNOSIS — N281 Cyst of kidney, acquired: Secondary | ICD-10-CM | POA: Insufficient documentation

## 2021-10-02 LAB — URINALYSIS, ROUTINE W REFLEX MICROSCOPIC
Bilirubin, UA: NEGATIVE
Glucose, UA: NEGATIVE
Ketones, UA: NEGATIVE
Leukocytes,UA: NEGATIVE
Nitrite, UA: NEGATIVE
Protein,UA: NEGATIVE
RBC, UA: NEGATIVE
Specific Gravity, UA: 1.01 (ref 1.005–1.030)
Urobilinogen, Ur: 0.2 mg/dL (ref 0.2–1.0)
pH, UA: 6.5 (ref 5.0–7.5)

## 2021-10-02 NOTE — Progress Notes (Signed)
Assessment: 1. Renal cyst     Plan: I reviewed the patient's imaging studies including the recent CT scan.  She has stable appearing dural simple renal cysts.  No further evaluation is indicated. Return to office prn  Chief Complaint:  Chief Complaint  Patient presents with   renal cyst     History of Present Illness:  Gabrielle Wood is a 75 y.o. year old female who is seen in consultation from [place], MD for evaluation of bilateral renal cysts.  She had a CT scan done on 09/18/2021 for evaluation of weight loss which showed multiple bilateral simple cyst with the largest measuring 4.7 cm in the right kidney, no evidence of solid mass or obstruction.  She is not having any flank pain.  No dysuria or gross hematuria.  No history of UTIs.  Review of her chart indicates prior CTs and ultrasounds showing renal cyst dating back to 2013.   Past Medical History:  Past Medical History:  Diagnosis Date   Arthritis    COPD (chronic obstructive pulmonary disease) ([place])    Diabetes mellitus    DVT (deep venous thrombosis) ([place])    years ago   GERD (gastroesophageal reflux disease)    H. pylori infection 2013   S/p treatment with amoxicillin, Biaxin, PPI   High cholesterol    Hypertension    Hypothyroidism    NSTEMI (non-ST elevated myocardial infarction) ([place]) 12/2020   s/p  PCI DES to the LCx   Thyroid disease     Past Surgical History:  Past Surgical History:  Procedure Laterality Date   BACK SURGERY     lumbar x3    ESOPHAGOGASTRODUODENOSCOPY  04/04/2012   Moderate gastritis/MODERATE Duodenitis in the duodenal bulb duodenum.  Pathology with chronic H. pylori gastritis.   EUS  05/14/2012   Normal pancreas, gallbladder,biliary tree/ Incidental finding of simple-appearing right renal cyst.   TONSILLECTOMY     TUBAL LIGATION     VEIN SURGERY     stripping years ago but recent laser surgery; bilateral legs    Allergies:  Allergies  Allergen Reactions    Ciprocin-Fluocin-Procin [Fluocinolone]     Nightmares   Lipitor [Atorvastatin] Other (See Comments)    Muscle aches, pain   Lisinopril Cough   Red Yeast Rice [Cholestin] Itching and Rash    Family History:  Family History  Problem Relation Age of Onset   Pancreatic cancer Mother        diagnosed at age 66s, died from heart attack   Inflammatory bowel disease Brother        crohn's, his dgt deceased from colon cancer at age 21   Colon cancer Neg Hx     Social History:  Social History   Tobacco Use   Smoking status: Former    Packs/day: 0.50    Types: Cigarettes   Smokeless tobacco: Never  Vaping Use   Vaping Use: Never used  Substance Use Topics   Alcohol use: No   Drug use: No    Review of symptoms:  Constitutional:  Negative for unexplained weight loss, night sweats, fever, chills ENT:  Negative for nose bleeds, sinus pain, painful swallowing CV:  Negative for chest pain, shortness of breath, exercise intolerance, palpitations, loss of consciousness Resp:  Negative for cough, wheezing, shortness of breath GI:  Negative for nausea, vomiting, diarrhea, bloody stools GU:  Positives noted in HPI; otherwise negative for gross hematuria, dysuria, urinary incontinence Neuro:  Negative for seizures, poor balance,  limb weakness, slurred speech Psych:  Negative for lack of energy, depression, anxiety Endocrine:  Negative for polydipsia, polyuria, symptoms of hypoglycemia (dizziness, hunger, sweating) Hematologic:  Negative for anemia, purpura, petechia, prolonged or excessive bleeding, use of anticoagulants  Allergic:  Negative for difficulty breathing or choking as a result of exposure to anything; no shellfish allergy; no allergic response (rash/itch) to materials, foods  Physical exam: BP 115/70   Pulse 84  GENERAL APPEARANCE:  Well appearing, well developed, well nourished, NAD HEENT: Atraumatic, Normocephalic, oropharynx clear. NECK: Supple without lymphadenopathy or  thyromegaly. LUNGS: Clear to auscultation bilaterally. HEART: Regular Rate and Rhythm without murmurs, gallops, or rubs. ABDOMEN: Soft, non-tender, No Masses. EXTREMITIES: Moves all extremities well.  Without clubbing, cyanosis, or edema. NEUROLOGIC:  Alert and oriented x 3, normal gait, CN II-XII grossly intact.  MENTAL STATUS:  Appropriate. BACK:  Non-tender to palpation.  No CVAT SKIN:  Warm, dry and intact.    Results: U/A dipstick negative

## 2021-10-02 NOTE — Progress Notes (Signed)
Urological Symptom Review ° °Patient is experiencing the following symptoms: °none ° ° °Review of Systems ° °Gastrointestinal (upper)  : °Negative for upper GI symptoms ° °Gastrointestinal (lower) : °Negative for lower GI symptoms ° °Constitutional : °Weight loss ° °Skin: °Negative for skin symptoms ° °Eyes: °Negative for eye symptoms ° °Ear/Nose/Throat : °Negative for Ear/Nose/Throat symptoms ° °Hematologic/Lymphatic: °Negative for Hematologic/Lymphatic symptoms ° °Cardiovascular : °Negative for cardiovascular symptoms ° °Respiratory : °Negative for respiratory symptoms ° °Endocrine: °Negative for endocrine symptoms ° °Musculoskeletal: °Negative for musculoskeletal symptoms ° °Neurological: °Negative for neurological symptoms ° °Psychologic: °Negative for psychiatric symptoms °

## 2021-10-04 ENCOUNTER — Ambulatory Visit: Payer: Medicare Other | Admitting: Internal Medicine

## 2021-10-11 ENCOUNTER — Ambulatory Visit: Payer: Medicare Other | Admitting: Internal Medicine

## 2021-10-12 DIAGNOSIS — Z888 Allergy status to other drugs, medicaments and biological substances status: Secondary | ICD-10-CM | POA: Diagnosis not present

## 2021-10-12 DIAGNOSIS — Z87891 Personal history of nicotine dependence: Secondary | ICD-10-CM | POA: Diagnosis not present

## 2021-10-12 DIAGNOSIS — Z91018 Allergy to other foods: Secondary | ICD-10-CM | POA: Diagnosis not present

## 2021-10-12 DIAGNOSIS — E079 Disorder of thyroid, unspecified: Secondary | ICD-10-CM | POA: Diagnosis not present

## 2021-10-12 DIAGNOSIS — E1165 Type 2 diabetes mellitus with hyperglycemia: Secondary | ICD-10-CM | POA: Diagnosis not present

## 2021-10-12 DIAGNOSIS — Z7982 Long term (current) use of aspirin: Secondary | ICD-10-CM | POA: Diagnosis not present

## 2021-10-12 DIAGNOSIS — I2510 Atherosclerotic heart disease of native coronary artery without angina pectoris: Secondary | ICD-10-CM | POA: Diagnosis not present

## 2021-10-12 DIAGNOSIS — Z7984 Long term (current) use of oral hypoglycemic drugs: Secondary | ICD-10-CM | POA: Diagnosis not present

## 2021-10-12 DIAGNOSIS — Z20822 Contact with and (suspected) exposure to covid-19: Secondary | ICD-10-CM | POA: Diagnosis not present

## 2021-10-12 DIAGNOSIS — Z79899 Other long term (current) drug therapy: Secondary | ICD-10-CM | POA: Diagnosis not present

## 2021-10-13 ENCOUNTER — Emergency Department (HOSPITAL_COMMUNITY): Payer: Medicare Other

## 2021-10-13 ENCOUNTER — Other Ambulatory Visit: Payer: Self-pay

## 2021-10-13 ENCOUNTER — Encounter (HOSPITAL_COMMUNITY): Payer: Self-pay | Admitting: *Deleted

## 2021-10-13 ENCOUNTER — Inpatient Hospital Stay (HOSPITAL_COMMUNITY)
Admission: EM | Admit: 2021-10-13 | Discharge: 2021-10-17 | DRG: 637 | Disposition: A | Discharge status: Home Health | Payer: Medicare Other | Attending: Family Medicine | Admitting: Family Medicine

## 2021-10-13 DIAGNOSIS — M1990 Unspecified osteoarthritis, unspecified site: Secondary | ICD-10-CM | POA: Diagnosis present

## 2021-10-13 DIAGNOSIS — Z79899 Other long term (current) drug therapy: Secondary | ICD-10-CM

## 2021-10-13 DIAGNOSIS — L8990 Pressure ulcer of unspecified site, unspecified stage: Secondary | ICD-10-CM | POA: Insufficient documentation

## 2021-10-13 DIAGNOSIS — Z87891 Personal history of nicotine dependence: Secondary | ICD-10-CM | POA: Diagnosis not present

## 2021-10-13 DIAGNOSIS — Z7984 Long term (current) use of oral hypoglycemic drugs: Secondary | ICD-10-CM | POA: Diagnosis not present

## 2021-10-13 DIAGNOSIS — I252 Old myocardial infarction: Secondary | ICD-10-CM

## 2021-10-13 DIAGNOSIS — E876 Hypokalemia: Secondary | ICD-10-CM | POA: Diagnosis not present

## 2021-10-13 DIAGNOSIS — Z800 Family history of malignant neoplasm of digestive organs: Secondary | ICD-10-CM

## 2021-10-13 DIAGNOSIS — J440 Chronic obstructive pulmonary disease with acute lower respiratory infection: Secondary | ICD-10-CM | POA: Diagnosis not present

## 2021-10-13 DIAGNOSIS — J181 Lobar pneumonia, unspecified organism: Secondary | ICD-10-CM | POA: Diagnosis present

## 2021-10-13 DIAGNOSIS — Z86718 Personal history of other venous thrombosis and embolism: Secondary | ICD-10-CM

## 2021-10-13 DIAGNOSIS — J189 Pneumonia, unspecified organism: Secondary | ICD-10-CM

## 2021-10-13 DIAGNOSIS — Z794 Long term (current) use of insulin: Secondary | ICD-10-CM | POA: Diagnosis not present

## 2021-10-13 DIAGNOSIS — I471 Supraventricular tachycardia: Secondary | ICD-10-CM | POA: Diagnosis not present

## 2021-10-13 DIAGNOSIS — E039 Hypothyroidism, unspecified: Secondary | ICD-10-CM | POA: Diagnosis present

## 2021-10-13 DIAGNOSIS — K219 Gastro-esophageal reflux disease without esophagitis: Secondary | ICD-10-CM | POA: Diagnosis present

## 2021-10-13 DIAGNOSIS — E7800 Pure hypercholesterolemia, unspecified: Secondary | ICD-10-CM | POA: Diagnosis not present

## 2021-10-13 DIAGNOSIS — I10 Essential (primary) hypertension: Secondary | ICD-10-CM | POA: Diagnosis present

## 2021-10-13 DIAGNOSIS — Z7982 Long term (current) use of aspirin: Secondary | ICD-10-CM

## 2021-10-13 DIAGNOSIS — Z7902 Long term (current) use of antithrombotics/antiplatelets: Secondary | ICD-10-CM

## 2021-10-13 DIAGNOSIS — R918 Other nonspecific abnormal finding of lung field: Secondary | ICD-10-CM | POA: Diagnosis not present

## 2021-10-13 DIAGNOSIS — Z20822 Contact with and (suspected) exposure to covid-19: Secondary | ICD-10-CM | POA: Diagnosis present

## 2021-10-13 DIAGNOSIS — Z79890 Hormone replacement therapy (postmenopausal): Secondary | ICD-10-CM

## 2021-10-13 DIAGNOSIS — J90 Pleural effusion, not elsewhere classified: Secondary | ICD-10-CM | POA: Diagnosis not present

## 2021-10-13 DIAGNOSIS — L89152 Pressure ulcer of sacral region, stage 2: Secondary | ICD-10-CM | POA: Diagnosis not present

## 2021-10-13 DIAGNOSIS — Z8249 Family history of ischemic heart disease and other diseases of the circulatory system: Secondary | ICD-10-CM

## 2021-10-13 DIAGNOSIS — L89300 Pressure ulcer of unspecified buttock, unstageable: Secondary | ICD-10-CM | POA: Diagnosis not present

## 2021-10-13 DIAGNOSIS — I2510 Atherosclerotic heart disease of native coronary artery without angina pectoris: Secondary | ICD-10-CM | POA: Diagnosis not present

## 2021-10-13 DIAGNOSIS — R0689 Other abnormalities of breathing: Secondary | ICD-10-CM | POA: Diagnosis not present

## 2021-10-13 DIAGNOSIS — E8809 Other disorders of plasma-protein metabolism, not elsewhere classified: Secondary | ICD-10-CM | POA: Diagnosis not present

## 2021-10-13 DIAGNOSIS — R0789 Other chest pain: Secondary | ICD-10-CM | POA: Diagnosis not present

## 2021-10-13 DIAGNOSIS — J449 Chronic obstructive pulmonary disease, unspecified: Secondary | ICD-10-CM | POA: Diagnosis not present

## 2021-10-13 DIAGNOSIS — E038 Other specified hypothyroidism: Secondary | ICD-10-CM | POA: Diagnosis not present

## 2021-10-13 DIAGNOSIS — R739 Hyperglycemia, unspecified: Secondary | ICD-10-CM | POA: Diagnosis not present

## 2021-10-13 DIAGNOSIS — Z743 Need for continuous supervision: Secondary | ICD-10-CM | POA: Diagnosis not present

## 2021-10-13 DIAGNOSIS — R079 Chest pain, unspecified: Secondary | ICD-10-CM

## 2021-10-13 DIAGNOSIS — E1110 Type 2 diabetes mellitus with ketoacidosis without coma: Secondary | ICD-10-CM | POA: Diagnosis not present

## 2021-10-13 DIAGNOSIS — Z955 Presence of coronary angioplasty implant and graft: Secondary | ICD-10-CM

## 2021-10-13 DIAGNOSIS — J9811 Atelectasis: Secondary | ICD-10-CM | POA: Diagnosis not present

## 2021-10-13 DIAGNOSIS — R0902 Hypoxemia: Secondary | ICD-10-CM | POA: Diagnosis not present

## 2021-10-13 LAB — CBG MONITORING, ED
Glucose-Capillary: 479 mg/dL — ABNORMAL HIGH (ref 70–99)
Glucose-Capillary: 497 mg/dL — ABNORMAL HIGH (ref 70–99)

## 2021-10-13 MED ORDER — INSULIN ASPART 100 UNIT/ML IJ SOLN
10.0000 [IU] | Freq: Once | INTRAMUSCULAR | Status: AC
  Administered 2021-10-13: 10 [IU] via INTRAVENOUS
  Filled 2021-10-13: qty 1

## 2021-10-13 MED ORDER — LACTATED RINGERS IV BOLUS
1000.0000 mL | Freq: Once | INTRAVENOUS | Status: AC
  Administered 2021-10-13: 1000 mL via INTRAVENOUS

## 2021-10-13 MED ORDER — ACETAMINOPHEN 500 MG PO TABS
1000.0000 mg | ORAL_TABLET | Freq: Once | ORAL | Status: AC
  Administered 2021-10-13: 1000 mg via ORAL
  Filled 2021-10-13: qty 2

## 2021-10-13 MED ORDER — IPRATROPIUM-ALBUTEROL 0.5-2.5 (3) MG/3ML IN SOLN
3.0000 mL | Freq: Once | RESPIRATORY_TRACT | Status: AC
  Administered 2021-10-14: 3 mL via RESPIRATORY_TRACT
  Filled 2021-10-13: qty 3

## 2021-10-13 MED ORDER — FENTANYL CITRATE PF 50 MCG/ML IJ SOSY
50.0000 ug | PREFILLED_SYRINGE | Freq: Once | INTRAMUSCULAR | Status: AC
  Administered 2021-10-13: 50 ug via INTRAVENOUS
  Filled 2021-10-13: qty 1
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
## 2021-10-13 NOTE — ED Triage Notes (Signed)
Pt brought in by ems for c/o high cbg; ems got reading of 561; pt also c/o left flank pain and feeling of weak for the last week; O2 sats 89-90% with ems,  they applied 2L of O2

## 2021-10-14 ENCOUNTER — Emergency Department (HOSPITAL_COMMUNITY): Payer: Medicare Other

## 2021-10-14 DIAGNOSIS — Z20822 Contact with and (suspected) exposure to covid-19: Secondary | ICD-10-CM | POA: Diagnosis present

## 2021-10-14 DIAGNOSIS — I10 Essential (primary) hypertension: Secondary | ICD-10-CM | POA: Diagnosis present

## 2021-10-14 DIAGNOSIS — L89300 Pressure ulcer of unspecified buttock, unstageable: Secondary | ICD-10-CM | POA: Diagnosis not present

## 2021-10-14 DIAGNOSIS — E1110 Type 2 diabetes mellitus with ketoacidosis without coma: Secondary | ICD-10-CM | POA: Diagnosis present

## 2021-10-14 DIAGNOSIS — E039 Hypothyroidism, unspecified: Secondary | ICD-10-CM | POA: Diagnosis present

## 2021-10-14 DIAGNOSIS — J189 Pneumonia, unspecified organism: Secondary | ICD-10-CM

## 2021-10-14 DIAGNOSIS — E8809 Other disorders of plasma-protein metabolism, not elsewhere classified: Secondary | ICD-10-CM

## 2021-10-14 DIAGNOSIS — Z8249 Family history of ischemic heart disease and other diseases of the circulatory system: Secondary | ICD-10-CM | POA: Diagnosis not present

## 2021-10-14 DIAGNOSIS — Z794 Long term (current) use of insulin: Secondary | ICD-10-CM | POA: Diagnosis not present

## 2021-10-14 DIAGNOSIS — I252 Old myocardial infarction: Secondary | ICD-10-CM | POA: Diagnosis not present

## 2021-10-14 DIAGNOSIS — Z87891 Personal history of nicotine dependence: Secondary | ICD-10-CM | POA: Diagnosis not present

## 2021-10-14 DIAGNOSIS — Z7984 Long term (current) use of oral hypoglycemic drugs: Secondary | ICD-10-CM | POA: Diagnosis not present

## 2021-10-14 DIAGNOSIS — J440 Chronic obstructive pulmonary disease with acute lower respiratory infection: Secondary | ICD-10-CM | POA: Diagnosis present

## 2021-10-14 DIAGNOSIS — I2510 Atherosclerotic heart disease of native coronary artery without angina pectoris: Secondary | ICD-10-CM | POA: Diagnosis present

## 2021-10-14 DIAGNOSIS — Z800 Family history of malignant neoplasm of digestive organs: Secondary | ICD-10-CM | POA: Diagnosis not present

## 2021-10-14 DIAGNOSIS — I471 Supraventricular tachycardia: Secondary | ICD-10-CM | POA: Diagnosis not present

## 2021-10-14 DIAGNOSIS — J181 Lobar pneumonia, unspecified organism: Secondary | ICD-10-CM | POA: Diagnosis present

## 2021-10-14 DIAGNOSIS — E7800 Pure hypercholesterolemia, unspecified: Secondary | ICD-10-CM | POA: Diagnosis present

## 2021-10-14 DIAGNOSIS — K219 Gastro-esophageal reflux disease without esophagitis: Secondary | ICD-10-CM | POA: Diagnosis present

## 2021-10-14 DIAGNOSIS — L89152 Pressure ulcer of sacral region, stage 2: Secondary | ICD-10-CM | POA: Diagnosis present

## 2021-10-14 DIAGNOSIS — M1990 Unspecified osteoarthritis, unspecified site: Secondary | ICD-10-CM | POA: Diagnosis present

## 2021-10-14 DIAGNOSIS — E876 Hypokalemia: Secondary | ICD-10-CM | POA: Diagnosis present

## 2021-10-14 DIAGNOSIS — E038 Other specified hypothyroidism: Secondary | ICD-10-CM

## 2021-10-14 DIAGNOSIS — Z79890 Hormone replacement therapy (postmenopausal): Secondary | ICD-10-CM | POA: Diagnosis not present

## 2021-10-14 DIAGNOSIS — Z7982 Long term (current) use of aspirin: Secondary | ICD-10-CM | POA: Diagnosis not present

## 2021-10-14 HISTORY — DX: Pneumonia, unspecified organism: J18.9

## 2021-10-14 HISTORY — DX: Type 2 diabetes mellitus with ketoacidosis without coma: E11.10

## 2021-10-14 LAB — URINALYSIS, ROUTINE W REFLEX MICROSCOPIC
Bacteria, UA: NONE SEEN
Bilirubin Urine: NEGATIVE
Glucose, UA: >=500 mg/dL — AB
Ketones, ur: 80 mg/dL — AB
Leukocytes,Ua: NEGATIVE
Nitrite: NEGATIVE
Protein, ur: NEGATIVE mg/dL
Specific Gravity, Urine: >1.046 — ABNORMAL HIGH (ref 1.005–1.030)
pH: 5 (ref 5.0–8.0)

## 2021-10-14 LAB — COMPREHENSIVE METABOLIC PANEL
ALT: 13 U/L (ref 0–44)
AST: 15 U/L (ref 15–41)
Albumin: 3.4 g/dL — ABNORMAL LOW (ref 3.5–5.0)
Alkaline Phosphatase: 51 U/L (ref 38–126)
Anion gap: 19 — ABNORMAL HIGH (ref 5–15)
BUN: 21 mg/dL (ref 8–23)
CO2: 10 mmol/L — ABNORMAL LOW (ref 22–32)
Calcium: 9.2 mg/dL (ref 8.9–10.3)
Chloride: 101 mmol/L (ref 98–111)
Creatinine, Ser: 0.94 mg/dL (ref 0.44–1.00)
GFR, Estimated: >60 mL/min (ref >60)
Glucose, Bld: 482 mg/dL — ABNORMAL HIGH (ref 70–99)
Potassium: 3.7 mmol/L (ref 3.5–5.1)
Sodium: 130 mmol/L — ABNORMAL LOW (ref 135–145)
Total Bilirubin: 1.6 mg/dL — ABNORMAL HIGH (ref 0.3–1.2)
Total Protein: 6.2 g/dL — ABNORMAL LOW (ref 6.5–8.1)

## 2021-10-14 LAB — BASIC METABOLIC PANEL
Anion gap: 7 (ref 5–15)
Anion gap: 8 (ref 5–15)
Anion gap: 4 — ABNORMAL LOW (ref 5–15)
Anion gap: 9 (ref 5–15)
Anion gap: 9 (ref 5–15)
BUN: 19 mg/dL (ref 8–23)
BUN: 21 mg/dL (ref 8–23)
BUN: 21 mg/dL (ref 8–23)
BUN: 22 mg/dL (ref 8–23)
BUN: 19 mg/dL (ref 8–23)
CO2: 18 mmol/L — ABNORMAL LOW (ref 22–32)
CO2: 18 mmol/L — ABNORMAL LOW (ref 22–32)
CO2: 21 mmol/L — ABNORMAL LOW (ref 22–32)
CO2: 17 mmol/L — ABNORMAL LOW (ref 22–32)
CO2: 18 mmol/L — ABNORMAL LOW (ref 22–32)
Calcium: 8.7 mg/dL — ABNORMAL LOW (ref 8.9–10.3)
Calcium: 9.1 mg/dL (ref 8.9–10.3)
Calcium: 9 mg/dL (ref 8.9–10.3)
Calcium: 9.1 mg/dL (ref 8.9–10.3)
Calcium: 8.6 mg/dL — ABNORMAL LOW (ref 8.9–10.3)
Chloride: 107 mmol/L (ref 98–111)
Chloride: 107 mmol/L (ref 98–111)
Chloride: 106 mmol/L (ref 98–111)
Chloride: 105 mmol/L (ref 98–111)
Chloride: 104 mmol/L (ref 98–111)
Creatinine, Ser: 0.7 mg/dL (ref 0.44–1.00)
Creatinine, Ser: 0.72 mg/dL (ref 0.44–1.00)
Creatinine, Ser: 0.79 mg/dL (ref 0.44–1.00)
Creatinine, Ser: 0.72 mg/dL (ref 0.44–1.00)
Creatinine, Ser: 0.61 mg/dL (ref 0.44–1.00)
GFR, Estimated: >60 mL/min (ref >60)
GFR, Estimated: >60 mL/min (ref >60)
GFR, Estimated: >60 mL/min (ref >60)
GFR, Estimated: >60 mL/min (ref >60)
GFR, Estimated: >60 mL/min (ref >60)
Glucose, Bld: 240 mg/dL — ABNORMAL HIGH (ref 70–99)
Glucose, Bld: 173 mg/dL — ABNORMAL HIGH (ref 70–99)
Glucose, Bld: 189 mg/dL — ABNORMAL HIGH (ref 70–99)
Glucose, Bld: 164 mg/dL — ABNORMAL HIGH (ref 70–99)
Glucose, Bld: 138 mg/dL — ABNORMAL HIGH (ref 70–99)
Potassium: 3.4 mmol/L — ABNORMAL LOW (ref 3.5–5.1)
Potassium: 3.3 mmol/L — ABNORMAL LOW (ref 3.5–5.1)
Potassium: 3.7 mmol/L (ref 3.5–5.1)
Potassium: 3.7 mmol/L (ref 3.5–5.1)
Potassium: 3.5 mmol/L (ref 3.5–5.1)
Sodium: 132 mmol/L — ABNORMAL LOW (ref 135–145)
Sodium: 133 mmol/L — ABNORMAL LOW (ref 135–145)
Sodium: 131 mmol/L — ABNORMAL LOW (ref 135–145)
Sodium: 131 mmol/L — ABNORMAL LOW (ref 135–145)
Sodium: 131 mmol/L — ABNORMAL LOW (ref 135–145)

## 2021-10-14 LAB — CBC
HCT: 40 % (ref 36.0–46.0)
Hemoglobin: 13.4 g/dL (ref 12.0–15.0)
MCH: 32.8 pg (ref 26.0–34.0)
MCHC: 33.5 g/dL (ref 30.0–36.0)
MCV: 97.8 fL (ref 80.0–100.0)
Platelets: 119 10*3/uL — ABNORMAL LOW (ref 150–400)
RBC: 4.09 MIL/uL (ref 3.87–5.11)
RDW: 14.6 % (ref 11.5–15.5)
WBC: 18.1 10*3/uL — ABNORMAL HIGH (ref 4.0–10.5)
nRBC: 0 % (ref 0.0–0.2)

## 2021-10-14 LAB — BLOOD GAS, VENOUS
Acid-base deficit: 13.8 mmol/L — ABNORMAL HIGH (ref 0.0–2.0)
Bicarbonate: 14 mmol/L — ABNORMAL LOW (ref 20.0–28.0)
FIO2: 21
O2 Saturation: 86.2 %
Patient temperature: 36.4
pCO2, Ven: 26.2 mmHg — ABNORMAL LOW (ref 44.0–60.0)
pH, Ven: 7.274 (ref 7.250–7.430)
pO2, Ven: 58.5 mmHg — ABNORMAL HIGH (ref 32.0–45.0)

## 2021-10-14 LAB — BETA-HYDROXYBUTYRIC ACID
Beta-Hydroxybutyric Acid: 3.69 mmol/L — ABNORMAL HIGH (ref 0.05–0.27)
Beta-Hydroxybutyric Acid: 0.11 mmol/L (ref 0.05–0.27)
Beta-Hydroxybutyric Acid: 0.17 mmol/L (ref 0.05–0.27)

## 2021-10-14 LAB — RESP PANEL BY RT-PCR (FLU A&B, COVID) ARPGX2
Influenza A by PCR: NEGATIVE
Influenza B by PCR: NEGATIVE
SARS Coronavirus 2 by RT PCR: NEGATIVE

## 2021-10-14 LAB — CBG MONITORING, ED
Glucose-Capillary: 418 mg/dL — ABNORMAL HIGH (ref 70–99)
Glucose-Capillary: 361 mg/dL — ABNORMAL HIGH (ref 70–99)
Glucose-Capillary: 371 mg/dL — ABNORMAL HIGH (ref 70–99)
Glucose-Capillary: 335 mg/dL — ABNORMAL HIGH (ref 70–99)
Glucose-Capillary: 276 mg/dL — ABNORMAL HIGH (ref 70–99)
Glucose-Capillary: 204 mg/dL — ABNORMAL HIGH (ref 70–99)
Glucose-Capillary: 219 mg/dL — ABNORMAL HIGH (ref 70–99)
Glucose-Capillary: 208 mg/dL — ABNORMAL HIGH (ref 70–99)
Glucose-Capillary: 197 mg/dL — ABNORMAL HIGH (ref 70–99)
Glucose-Capillary: 158 mg/dL — ABNORMAL HIGH (ref 70–99)
Glucose-Capillary: 187 mg/dL — ABNORMAL HIGH (ref 70–99)
Glucose-Capillary: 184 mg/dL — ABNORMAL HIGH (ref 70–99)

## 2021-10-14 LAB — TROPONIN I (HIGH SENSITIVITY)
Troponin I (High Sensitivity): 15 ng/L (ref <18)
Troponin I (High Sensitivity): 17 ng/L (ref <18)

## 2021-10-14 LAB — GLUCOSE, CAPILLARY
Glucose-Capillary: 146 mg/dL — ABNORMAL HIGH (ref 70–99)
Glucose-Capillary: 135 mg/dL — ABNORMAL HIGH (ref 70–99)

## 2021-10-14 LAB — MAGNESIUM: Magnesium: 1.5 mg/dL — ABNORMAL LOW (ref 1.7–2.4)

## 2021-10-14 LAB — LIPASE, BLOOD: Lipase: 17 U/L (ref 11–51)

## 2021-10-14 MED ORDER — CHLORHEXIDINE GLUCONATE CLOTH 2 % EX PADS
6.0000 | MEDICATED_PAD | Freq: Every day | CUTANEOUS | Status: DC
  Administered 2021-10-14 – 2021-10-16 (×3): 6 via TOPICAL

## 2021-10-14 MED ORDER — GABAPENTIN 300 MG PO CAPS
300.0000 mg | ORAL_CAPSULE | Freq: Three times a day (TID) | ORAL | Status: DC
  Administered 2021-10-14 – 2021-10-17 (×10): 300 mg via ORAL
  Filled 2021-10-14 (×10): qty 1

## 2021-10-14 MED ORDER — SODIUM CHLORIDE 0.9 % IV SOLN
1.0000 g | INTRAVENOUS | Status: DC
  Administered 2021-10-15 – 2021-10-17 (×3): 1 g via INTRAVENOUS
  Filled 2021-10-14 (×3): qty 10

## 2021-10-14 MED ORDER — INSULIN DETEMIR 100 UNIT/ML ~~LOC~~ SOLN
20.0000 [IU] | Freq: Every day | SUBCUTANEOUS | Status: DC
  Administered 2021-10-14 – 2021-10-16 (×3): 20 [IU] via SUBCUTANEOUS
  Filled 2021-10-14 (×4): qty 0.2

## 2021-10-14 MED ORDER — INSULIN ASPART 100 UNIT/ML IJ SOLN
0.0000 [IU] | Freq: Three times a day (TID) | INTRAMUSCULAR | Status: DC
  Administered 2021-10-14: 2 [IU] via SUBCUTANEOUS
  Administered 2021-10-14: 1 [IU] via SUBCUTANEOUS
  Administered 2021-10-14 – 2021-10-15 (×3): 2 [IU] via SUBCUTANEOUS
  Administered 2021-10-16: 7 [IU] via SUBCUTANEOUS
  Administered 2021-10-16: 3 [IU] via SUBCUTANEOUS
  Administered 2021-10-16 – 2021-10-17 (×2): 2 [IU] via SUBCUTANEOUS
  Filled 2021-10-14 (×2): qty 1

## 2021-10-14 MED ORDER — PANTOPRAZOLE SODIUM 40 MG PO TBEC
40.0000 mg | DELAYED_RELEASE_TABLET | Freq: Every day | ORAL | Status: DC
  Administered 2021-10-14 – 2021-10-17 (×4): 40 mg via ORAL
  Filled 2021-10-14 (×4): qty 1

## 2021-10-14 MED ORDER — ASPIRIN EC 81 MG PO TBEC
81.0000 mg | DELAYED_RELEASE_TABLET | Freq: Every day | ORAL | Status: DC
  Administered 2021-10-14 – 2021-10-17 (×4): 81 mg via ORAL
  Filled 2021-10-14 (×4): qty 1

## 2021-10-14 MED ORDER — POTASSIUM CHLORIDE IN NACL 20-0.9 MEQ/L-% IV SOLN
INTRAVENOUS | Status: DC
  Filled 2021-10-14: qty 1000

## 2021-10-14 MED ORDER — LACTATED RINGERS IV SOLN: INTRAVENOUS | Status: DC

## 2021-10-14 MED ORDER — INSULIN DETEMIR 100 UNIT/ML ~~LOC~~ SOLN
10.0000 [IU] | Freq: Once | SUBCUTANEOUS | Status: AC
  Administered 2021-10-14: 10 [IU] via SUBCUTANEOUS
  Filled 2021-10-14: qty 0.1

## 2021-10-14 MED ORDER — OXYCODONE HCL 5 MG PO TABS
5.0000 mg | ORAL_TABLET | Freq: Four times a day (QID) | ORAL | Status: DC | PRN
  Administered 2021-10-14 – 2021-10-17 (×2): 5 mg via ORAL
  Filled 2021-10-14 (×2): qty 1

## 2021-10-14 MED ORDER — LACTATED RINGERS IV BOLUS
20.0000 mL/kg | Freq: Once | INTRAVENOUS | Status: AC
  Administered 2021-10-14: 1152 mL via INTRAVENOUS

## 2021-10-14 MED ORDER — MAGNESIUM SULFATE 2 GM/50ML IV SOLN
2.0000 g | Freq: Once | INTRAVENOUS | Status: AC
  Administered 2021-10-14: 2 g via INTRAVENOUS
  Filled 2021-10-14: qty 50

## 2021-10-14 MED ORDER — POTASSIUM CHLORIDE 10 MEQ/100ML IV SOLN
10.0000 meq | INTRAVENOUS | Status: AC
  Administered 2021-10-14 (×2): 10 meq via INTRAVENOUS
  Filled 2021-10-14 (×2): qty 100

## 2021-10-14 MED ORDER — SODIUM CHLORIDE 0.9 % IV SOLN
500.0000 mg | INTRAVENOUS | Status: DC
  Administered 2021-10-14 – 2021-10-15 (×2): 500 mg via INTRAVENOUS
  Filled 2021-10-14 (×2): qty 500

## 2021-10-14 MED ORDER — INSULIN REGULAR(HUMAN) IN NACL 100-0.9 UT/100ML-% IV SOLN
INTRAVENOUS | Status: DC
  Administered 2021-10-14 (×2): 9.5 [IU]/h via INTRAVENOUS
  Filled 2021-10-14: qty 100

## 2021-10-14 MED ORDER — TICAGRELOR 90 MG PO TABS
90.0000 mg | ORAL_TABLET | Freq: Two times a day (BID) | ORAL | Status: DC
  Administered 2021-10-14 – 2021-10-17 (×7): 90 mg via ORAL
  Filled 2021-10-14 (×7): qty 1

## 2021-10-14 MED ORDER — DEXTROSE 50 % IV SOLN: 0.0000 mL | INTRAVENOUS | Status: DC | PRN

## 2021-10-14 MED ORDER — IRBESARTAN 150 MG PO TABS
75.0000 mg | ORAL_TABLET | Freq: Every day | ORAL | Status: DC
  Administered 2021-10-14 – 2021-10-17 (×4): 75 mg via ORAL
  Filled 2021-10-14 (×4): qty 1

## 2021-10-14 MED ORDER — HEPARIN SODIUM (PORCINE) 5000 UNIT/ML IJ SOLN
5000.0000 [IU] | Freq: Three times a day (TID) | INTRAMUSCULAR | Status: DC
  Administered 2021-10-14 – 2021-10-17 (×10): 5000 [IU] via SUBCUTANEOUS
  Filled 2021-10-14 (×10): qty 1

## 2021-10-14 MED ORDER — METOPROLOL SUCCINATE ER 25 MG PO TB24
25.0000 mg | ORAL_TABLET | Freq: Every evening | ORAL | Status: DC
  Administered 2021-10-14: 25 mg via ORAL
  Filled 2021-10-14: qty 1

## 2021-10-14 MED ORDER — DILTIAZEM HCL-DEXTROSE 125-5 MG/125ML-% IV SOLN (PREMIX)
5.0000 mg/h | INTRAVENOUS | Status: DC
  Administered 2021-10-14: 5 mg/h via INTRAVENOUS
  Filled 2021-10-14 (×2): qty 125

## 2021-10-14 MED ORDER — IOHEXOL 350 MG/ML SOLN
100.0000 mL | Freq: Once | INTRAVENOUS | Status: AC | PRN
  Administered 2021-10-14: 100 mL via INTRAVENOUS

## 2021-10-14 MED ORDER — ATORVASTATIN CALCIUM 40 MG PO TABS: 80.0000 mg | ORAL_TABLET | Freq: Every day | ORAL | Status: DC

## 2021-10-14 MED ORDER — ACETAMINOPHEN 325 MG PO TABS: 650.0000 mg | ORAL_TABLET | Freq: Four times a day (QID) | ORAL | Status: DC | PRN

## 2021-10-14 MED ORDER — MORPHINE SULFATE (PF) 2 MG/ML IV SOLN
2.0000 mg | Freq: Once | INTRAVENOUS | Status: AC
  Administered 2021-10-14: 2 mg via INTRAVENOUS
  Filled 2021-10-14: qty 1

## 2021-10-14 MED ORDER — ALPRAZOLAM 0.25 MG PO TABS
0.2500 mg | ORAL_TABLET | Freq: Two times a day (BID) | ORAL | Status: DC | PRN
  Administered 2021-10-16 (×3): 0.25 mg via ORAL
  Filled 2021-10-14 (×3): qty 1

## 2021-10-14 MED ORDER — SODIUM CHLORIDE 0.9 % IV SOLN
2.0000 g | Freq: Once | INTRAVENOUS | Status: AC
  Administered 2021-10-14: 2 g via INTRAVENOUS
  Filled 2021-10-14: qty 20

## 2021-10-14 MED ORDER — AMLODIPINE BESYLATE 5 MG PO TABS
5.0000 mg | ORAL_TABLET | Freq: Every day | ORAL | Status: DC
  Administered 2021-10-14: 5 mg via ORAL
  Filled 2021-10-14: qty 1

## 2021-10-14 MED ORDER — DEXTROSE IN LACTATED RINGERS 5 % IV SOLN: INTRAVENOUS | Status: DC

## 2021-10-14 MED ORDER — LEVOTHYROXINE SODIUM 25 MCG PO TABS
125.0000 ug | ORAL_TABLET | Freq: Every day | ORAL | Status: DC
  Administered 2021-10-14 – 2021-10-17 (×4): 125 ug via ORAL
  Filled 2021-10-14: qty 3
  Filled 2021-10-14 (×3): qty 1

## 2021-10-14 MED ORDER — SODIUM CHLORIDE 0.9 % IV SOLN: INTRAVENOUS | Status: DC

## 2021-10-14 MED ORDER — LIDOCAINE 5 % EX PTCH
1.0000 | MEDICATED_PATCH | CUTANEOUS | Status: DC
  Administered 2021-10-14 – 2021-10-17 (×4): 1 via TRANSDERMAL
  Filled 2021-10-14 (×6): qty 1

## 2021-10-14 MED ORDER — INSULIN DEGLUDEC 100 UNIT/ML ~~LOC~~ SOPN: 20.0000 [IU] | PEN_INJECTOR | Freq: Every day | SUBCUTANEOUS | Status: DC

## 2021-10-14 NOTE — ED Notes (Signed)
Pt is resting and does not appear to be in any pain. Pt vitals are stable at this time and patient does not express any needs. Will continue to monitor pt and check CBG.

## 2021-10-14 NOTE — Progress Notes (Signed)
Pt is being transferred to step down to start a diltiazem drip. Pt heart rate jumped up to the 180s and had runs of svt. MD made aware.

## 2021-10-14 NOTE — Progress Notes (Signed)
PROGRESS NOTE    Patient: Gabrielle Wood                            PCP: La Minita Nation, MD                    DOB: 02-20-1946            DOA: 10/13/2021 LZJ:[id number]             DOS: 10/14/2021, 9:14 AM   LOS: 0 days   Date of Service: The patient was seen and examined on 10/14/2021  Subjective:   The patient was seen and examined this morning. Hemodynamically stable, afebrile, normotensive Denies any shortness of breath or chest pain.  Complaining of mild abdominal discomfort, lower back, buttocks discomfort.  Brief Narrative:   Gabrielle Wood  is a 75 y.o. female, with history of COPD, diabetes mellitus type 2, DVT, H. pylori infection, high cholesterol, hypertension, hypothyroidism, NSTEMI, thyroid disease ...  presents the ED with a chief complaint of hyperglycemia.   Patient reports that she is here because of hyperglycemia.  She reports sugar at home was over 600.   ED: Temp 97.6, HR 75-93, RR 20-29, BP 133/68, satting at 97% Patient has a leukocytosis of 18.1 hemoglobin 13.4 Chemistry panel reveals a decreased bicarb at 10, pseudohyponatremia, and glucose of 482 with a gap of 19 VBG shows a pH of 7.2, PCO2 26 Patient did have CTA of her chest which showed no pulmonary embolism but did show an acute lobar pneumonia Beta hydroxybutyrate is elevated - T bili is elevated Patient was given 10 units of NovoLog, started on insulin drip, started on Rocephin and Zithromax, given 2.152 L of fluid, and Endo tool initiated Admitted for DKA and pneumonia . Assessment & Plan:   Active Problems:   Benign hypertension   Hypothyroidism   GERD (gastroesophageal reflux disease)   DKA (diabetic ketoacidosis) (HCC)   Hypomagnesemia   Hypoalbuminemia   Community acquired pneumonia   Diabetic ketoacidosis -stable uncontrolled diabetes mellitus type 2 - decreased bicarb at 10, pseudohyponatremia, and glucose of 482 with a gap of 19 VBG shows a pH of 7.2, PCO2 26 -Initiated on  treatment based on DKA protocol, aggressive IV fluid resuscitation, insulin drip -Monitoring CBG every hour, repleting electrolytes -Hyperglycemia, DKA complicated as result of pneumonia -Monitoring anion gap, urine hydroxybutyrate, once INR is close, initiating long-acting insulin, SSI coverage -Reinitiating home regimen of Tresiba, but holding metformin for now  Community-acquired pneumonia -On admission: Met SIRS criteria: afebrile, mildly tachypneic, tachycardic, WBC 18.1, satting 97% on room air, with metabolic acidosis -Continue IV fluid resuscitation, broad-spectrum antibiotics Rocephin, azithromycin -Follow-up with blood and sputum cultures Hypomagnesemia  Hypomagnesemia  -Monitoring, repleted  Hypothyroidism -We will continue Synthroid  Hypoalbuminemia -Monitoring repleted, encouraging p.o. intake Nutrition supplements  Hypertension -Stable continue home medication losartan, amlodipine  H/o coronary artery disease with stent In January 2022 -Currently stable continue home medication: Aspirin, Brilinta, statins, metoprolol,  GERD -Continue PPI,     ------------------------------------------------------------------------------------------------------------------------------ Cultures; Blood Cultures x 2 >> NGT Sputum Culture >> NGT   Antimicrobials: Antibiotics IV Rocephin, azithromycin   Consultants: None  --------------------------------------------------------------------------------------------------------------------------------  DVT prophylaxis:  SCD/Compression stockings and Heparin SQ Code Status:   Code Status: Full Code  Family Communication: No family member present at bedside- attempt will be made to update daily The above findings and plan of care has been discussed with patient (and family)  in detail,  they expressed understanding and agreement of above. -Advance care planning has been discussed.   Admission status:   Status is:  Inpatient  Remains inpatient appropriate because: Requiring aggressive IV treatment for diabetic ketoacidosis, fluids, insulin drip, will require IV antibiotics for pneumonia      Level of care: Stepdown   Procedures:   No admission procedures for hospital encounter.  Do not  Antimicrobials:  Anti-infectives (From admission, onward)    Start     Dose/Rate Route Frequency Ordered Stop   10/15/21 0300  cefTRIAXone (ROCEPHIN) 1 g in sodium chloride 0.9 % 100 mL IVPB        1 g 200 mL/hr over 30 Minutes Intravenous Every 24 hours 10/14/21 0544     10/14/21 0300  cefTRIAXone (ROCEPHIN) 2 g in sodium chloride 0.9 % 100 mL IVPB        2 g 200 mL/hr over 30 Minutes Intravenous  Once 10/14/21 0248 10/14/21 0353   10/14/21 0300  azithromycin (ZITHROMAX) 500 mg in sodium chloride 0.9 % 250 mL IVPB        500 mg 250 mL/hr over 60 Minutes Intravenous Every 24 hours 10/14/21 0248          Medication:   amLODipine  5 mg Oral Daily   aspirin EC  81 mg Oral Daily   gabapentin  300 mg Oral TID   heparin  5,000 Units Subcutaneous Q8H   insulin aspart  0-9 Units Subcutaneous TID WC   insulin detemir  20 Units Subcutaneous QHS   irbesartan  75 mg Oral Daily   levothyroxine  125 mcg Oral Daily   lidocaine  1 patch Transdermal Q24H   metoprolol succinate  25 mg Oral QPM   pantoprazole  40 mg Oral Daily   ticagrelor  90 mg Oral BID    acetaminophen, ALPRAZolam, dextrose, oxyCODONE   Objective:   Vitals:   10/14/21 0800 10/14/21 0815 10/14/21 0830 10/14/21 0845  BP: (!) 110/55 95/64 99/62 125/73  Pulse:  77 74   Resp:  (!) _0 Temp:      TempSrc:      SpO2:  95% 96%   Weight:      Height:        Intake/Output Summary (Last 24 hours) at 10/14/2021 0254 Last data filed at 10/14/2021 2706 Gross per 24 hour  Intake 3516.78 ml  Output --  Net 3516.78 ml   Filed Weights   10/13/21 2138  Weight: 57.6 kg     Examination:   Physical Exam  Constitution:  Alert,  cooperative, no distress,  Appears calm and comfortable  Psychiatric: Normal and stable mood and affect, cognition intact,   HEENT: Normocephalic, PERRL, otherwise with in Normal limits  Chest:Chest symmetric Cardio vascular:  S1/S2, RRR, No murmure, No Rubs or Gallops  pulmonary: Clear to auscultation bilaterally, respirations unlabored, negative wheezes / crackles Abdomen: Soft, non-tender, non-distended, bowel sounds,no masses, no organomegaly Muscular skeletal: Limited exam - in bed, able to move all 4 extremities, Normal strength,  Neuro: CNII-XII intact. , normal motor and sensation, reflexes intact  Extremities: No pitting edema lower extremities, +2 pulses  Skin: Dry, warm to touch, negative for any Rashes, No open wounds Wounds: per nursing documentation    ------------------------------------------------------------------------------------------------------------------------------------------    LABs:  CBC Latest Ref Rng & Units 10/13/2021 04/13/2021 08/21/2019  WBC 4.0 - 10.5 K/uL 18.1(H) 5.9 9.3  Hemoglobin 12.0 - 15.0 g/dL 13.4 13.8 10.4(L)  Hematocrit 36.0 -  46.0 % 40.0 41.3 32.6(L)  Platelets 150 - 400 K/uL 119(L) 151 284   CMP Latest Ref Rng & Units 10/14/2021 10/13/2021 04/25/2021  Glucose 70 - 99 mg/dL 240(H) 482(H) -  BUN 8 - 23 mg/dL _0 Creatinine 0.44 - 1.00 mg/dL 0.70 0.94 1.0  Sodium 135 - 145 mmol/L 132(L) 130(L) 139  Potassium 3.5 - 5.1 mmol/L 3.4(L) 3.7 3.8  Chloride 98 - 111 mmol/L 107 101 100  CO2 22 - 32 mmol/L 18(L) 10(L) 21  Calcium 8.9 - 10.3 mg/dL 8.7(L) 9.2 10.1  Total Protein 6.5 - 8.1 g/dL - 6.2(L) -  Total Bilirubin 0.3 - 1.2 mg/dL - 1.6(H) -  Alkaline Phos 38 - 126 U/L - 51 57  AST 15 - 41 U/L - 15 18  ALT 0 - 44 U/L - 13 10       Micro Results Recent Results (from the past 240 hour(s))  Resp Panel by RT-PCR (Flu A&B, Covid) Nasopharyngeal Swab     Status: None   Collection Time: 10/14/21  2:04 AM   Specimen: Nasopharyngeal  Swab; Nasopharyngeal(NP) swabs in vial transport medium  Result Value Ref Range Status   SARS Coronavirus 2 by RT PCR NEGATIVE NEGATIVE Final    Comment: (NOTE) SARS-CoV-2 target nucleic acids are NOT DETECTED.  The SARS-CoV-2 RNA is generally detectable in upper respiratory specimens during the acute phase of infection. The lowest concentration of SARS-CoV-2 viral copies this assay can detect is 138 copies/mL. A negative result does not preclude SARS-Cov-2 infection and should not be used as the sole basis for treatment or    Influenza A by PCR NEGATIVE NEGATIVE Final   Influenza B by PCR NEGATIVE NEGATIVE Final    Comment: (NOTE) The Xpert Xpress SARS-CoV-2/FLU/RSV plus assay is intended a    Radiology Reports DG Ribs Unilateral W/Chest Left  Result Date: 10/14/2021 CLINICAL DATA:  Left flank pain, left posterior chest pain EXAM: LEFT RIBS AND CHEST - 3+ VIEW COMPARISON:  CT 09/18/2021 FINDINGS: Small left pleural effusion has developed with wedge like opacity within the peripheral left lung base likely representing fluid within the fissure. The lungs are hyperinflated in keeping with changes of underlying COPD. No pneumothorax. Cardiac size within normal limits. Pulmonary vascularity is normal. No acute bone abnormality. Specifically, no rib fracture or destructive rib lesion identified. IMPRESSION: Interval development of small left pleural effusion. COPD Electronically Signed   By: Fidela Salisbury M.D.   On: 10/14/2021 00:15   CT Angio Chest PE W and/or Wo Contrast  Result Date: 10/14/2021 CLINICAL DATA:  PE suspected, high prob.  Hypoxia. EXAM: CT ANGIOGRAPHY CHEST WITH CONTRAST TECHNIQUE: Multidetector CT imaging of the chest was performed using the standard protocol during bolus administration of intravenous contrast. Multiplanar CT image reconstructions and MIPs were obtained to evaluate the vascular anatomy. CONTRAST:  127m O . IMPRESSION: Dense consolidation within the lingula  compatible with acute lobar pneumonia in the appropriate clinical setting. Small associated left pleural effusion may represent a parapneumonic effusion. No pulmonary embolism. Extensive multi-vessel coronary artery calcification. Aortic Atherosclerosis (ICD10-I70.0). Electronically Signed   By: AFidela SalisburyM.D.   On: 10/14/2021 02:42    SIGNED: SDeatra James MD, FHM. [facility],  Pager (please use [email or web address] to page/text) Please use Epic Secure Chat for non-urgent communication (7AM-7PM)  If 7PM-7AM, please contact night-coverage [email or web address], 10/14/2021, 9:14 AM

## 2021-10-14 NOTE — Progress Notes (Signed)
Inpatient Diabetes Program Recommendations  AACE/ADA: New Consensus Statement on Inpatient Glycemic Control (2015)  Target Ranges:  Prepandial:   less than 140 mg/dL      Peak postprandial:   less than 180 mg/dL (1-2 hours)      Critically ill patients:  140 - 180 mg/dL  Results for Gabrielle Wood, Gabrielle Wood (MRN [id number]) as of 10/14/2021 09:12  Ref. Range 10/13/2021 21:34 10/13/2021 22:50 10/14/2021 01:05 10/14/2021 01:35 10/14/2021 02:20 10/14/2021 03:20 10/14/2021 04:19 10/14/2021 05:18 10/14/2021 06:14 10/14/2021 07:05 10/14/2021 08:05 10/14/2021 09:05  Glucose-Capillary Latest Ref Range: 70 - 99 mg/dL 914 (H) 782 (H)  10 units Novolog @2344  418 (H) 371 (H) 361 (H)  IV Insulin Drip Started 335 (H) 276 (H) 204 (H) 219 (H) 208 (H) 197 (H)  IV Insulin Drip Stopped  10 units Levemir +  2 units Novolog  158 (H)  Results for Gabrielle Wood, Gabrielle Wood (MRN Chauncey Fischer) as of 10/14/2021 09:12  Ref. Range 08/15/2021 00:00  Hemoglobin A1C Unknown 8.9    Admit with: DKA/ Pneumonia  History: DM, COPD  Home DM Meds: Tresiba 20 units QHS        Humalog 5-8 units TID        Metformin 1000 mg BID  Current Orders: Levemir 20 units QHS      Novolog Sensitive Correction Scale/ SSI (0-9 units) TID AC    Endocrinologist: 08/17/2021, NP with [facility] Last seen 08/17/2021 Was told to take the following: Tresiba 20 units QHS Humalog 5-8 units TID Metformin 1000 mg BID   Note pt transitioned off the IV insulin Drip this AM  Agree with current orders  May need Novolog Meal Coverage once pt resumes PO diet     --Will follow patient during hospitalization--  June RN, MSN, CDE Diabetes Coordinator Inpatient Glycemic Control Team Team Pager: [phone] (8a-5p)

## 2021-10-14 NOTE — H&P (Signed)
TRH H&P    Patient Demographics:    Gabrielle Wood, is a 75 y.o. female  MRN: [id number]  DOB - [birth year]  Admit Date - 10/13/2021  Referring MD/NP/PA: [place]  Outpatient Primary MD for the patient is Biggs Nation, MD  Patient coming from: Home  Chief complaint- Hyperglycemia   HPI:    Gabrielle Wood  is a 75 y.o. female, with history of COPD, diabetes mellitus type 2, DVT, H. pylori infection, high cholesterol, hypertension, hypothyroidism, NSTEMI, thyroid disease, and more presents the ED with a chief complaint of hypoglycemia.  Unfortunately patient is not able to provide much history given that she is quite sedated after having fentanyl.  Patient reports that she is here because of hyperglycemia.  She reports sugar at home was over 600.  She did take an extra dose of insulin, but she does not know which kind-more likely the short acting and she reports that her sugar did not improve.  On chart review there reported that her sugar improved and then started going back up.  Patient reports that she has felt weak, but does not know when it started.  She has not had any fevers, but she has had a productive cough with yellow sputum.  She takes [place] and she reports compliance with this medication.  She reports polydipsia and polyuria, but no polyphagia, and she has been nauseous.  Patient did present to [facility] yesterday, they gave her some fluids and discharged her home.  When she got home she was very weak and relative tried to lift her from the car.  Since then she has had some left-sided rib pain.  She reports that it hurts to take a deep breath, it hurts to palpate the area, and it hurts at rest.  She reports that the pain medicine helped, but did not relieve the pain.  Patient has no other complaints at this time.  Patient does not smoke, does not drink.  She is vaccinated for COVID.  Patient is full  code.  In the ED temp 97.6, heart rate 75-93, respiratory rate 20-29, blood pressure 133/68, satting at 97% Patient has a leukocytosis of 18.1 hemoglobin 13.4 Chemistry panel reveals a decreased bicarb at 10, pseudohyponatremia, and glucose of 482 with a gap of 19 VBG shows a pH of 7.2, PCO2 26 Patient did have CTA of her chest which showed no pulmonary embolism but did show an acute lobar pneumonia Beta hydroxybutyrate is elevated T bili is elevated Patient was given 10 units of NovoLog, started on insulin drip, started on Rocephin and Zithromax, given 2.152 L of fluid, and Endo tool initiated Request for admission for DKA   Review of systems:    In addition to the HPI above,  No Fever-chills, No Headache, No changes with Vision or hearing, No problems swallowing food or Liquids, Admits to chest wall pain, cough No Abdominal pain, admits to nausea but no vomiting, bowel movements are regular, No Blood in stool or Urine, No dysuria, No new skin rashes or bruises, No new joints  pains-aches,  No new weakness, tingling, numbness in any extremity, No recent weight gain or loss, Admits to polyuria and polydipsia but no polyphagia No significant Mental Stressors.  All other systems reviewed and are negative.    Past History of the following :    Past Medical History:  Diagnosis Date   Arthritis    COPD (chronic obstructive pulmonary disease) ([place])    Diabetes mellitus    DVT (deep venous thrombosis) ([place])    years ago   GERD (gastroesophageal reflux disease)    H. pylori infection 2013   S/p treatment with amoxicillin, Biaxin, PPI   High cholesterol    Hypertension    Hypothyroidism    NSTEMI (non-ST elevated myocardial infarction) ([place]) 12/2020   s/p  PCI DES to the LCx   Thyroid disease       Past Surgical History:  Procedure Laterality Date   BACK SURGERY     lumbar x3    ESOPHAGOGASTRODUODENOSCOPY  04/04/2012   Moderate gastritis/MODERATE Duodenitis in the  duodenal bulb duodenum.  Pathology with chronic H. pylori gastritis.   EUS  05/14/2012   Normal pancreas, gallbladder,biliary tree/ Incidental finding of simple-appearing right renal cyst.   TONSILLECTOMY     TUBAL LIGATION     VEIN SURGERY     stripping years ago but recent laser surgery; bilateral legs      Social History:      Social History   Tobacco Use   Smoking status: Former    Packs/day: 0.50    Types: Cigarettes   Smokeless tobacco: Never  Substance Use Topics   Alcohol use: No       Family History :     Family History  Problem Relation Age of Onset   Pancreatic cancer Mother        diagnosed at age 40s, died from heart attack   Inflammatory bowel disease Brother        crohn's, his dgt deceased from colon cancer at age 80   Colon cancer Neg Hx       Home Medications:   Prior to Admission medications   Medication Sig Start Date End Date Taking? Authorizing Provider  ALPRAZolam Duanne Moron) 1 MG tablet Take 0.5-1 mg by mouth. 0.5 tablet in am and 1 tab at night 04/11/21   [provider]  amLODipine (NORVASC) 5 MG tablet Take 5 mg by mouth daily. 04/20/21   [provider]  aspirin EC 81 MG tablet Take 81 mg by mouth daily. Swallow whole.    [provider]  atorvastatin (LIPITOR) 80 MG tablet Take 80 mg by mouth at bedtime. 08/02/21   [provider]  baclofen (LIORESAL) 10 MG tablet Take 1 tablet by mouth 2 (two) times daily as needed. 08/22/18   [provider]  EASY COMFORT PEN NEEDLES 31G X 8 MM MISC AS DIRECTED FOUR TIMES DAILY 02/02/21   [provider]  gabapentin (NEURONTIN) 300 MG capsule Take 300 mg by mouth 3 (three) times daily. 07/29/21   [provider]  IBU 800 MG tablet Take 800 mg by mouth 3 (three) times daily as needed. 01/31/21   [provider]  insulin lispro (HUMALOG) 100 UNIT/ML KwikPen Inject 5-8 Units into the skin 3 (three) times daily. 07/20/21   [provider]   Lancets (ONETOUCH DELICA PLUS [id number]) MISC Apply topically 4 (four) times daily. 10/14/20   [provider]  levocetirizine (XYZAL) 5 MG tablet Take 1 tablet (5 mg total)  by mouth every evening. 04/13/21   Eber Hong, MD  levothyroxine (SYNTHROID) 125 MCG tablet Take 1 tablet (125 mcg total) by mouth daily. 08/17/21   Dani Gobble, NP  loperamide (IMODIUM) 2 MG capsule Take 2 mg by mouth 4 (four) times daily as needed. 06/28/21   [provider]  metFORMIN (GLUCOPHAGE) 500 MG tablet Take 2 tablets (1,000 mg total) by mouth 2 (two) times daily with a meal. 2 tabs bid 05/24/21   Dani Gobble, NP  metoprolol succinate (TOPROL-XL) 25 MG 24 hr tablet Take 1 tablet by mouth every evening. 04/05/21   [provider]  nitroGLYCERIN (NITROSTAT) 0.4 MG SL tablet Place 0.4 mg under the tongue every 5 (five) minutes as needed for chest pain.    [provider]  ondansetron (ZOFRAN-ODT) 4 MG disintegrating tablet Take 4 mg by mouth. 06/27/21   [provider]  [facility] ULTRA test strip 4 (four) times daily. 01/26/21   [provider]  pantoprazole (PROTONIX) 40 MG tablet Take 40 mg by mouth daily. 06/06/21   [provider]  ticagrelor (BRILINTA) 90 MG TABS tablet Take 90 mg by mouth 2 (two) times daily.    [provider]  TRESIBA FLEXTOUCH 100 UNIT/ML FlexTouch Pen Inject 20 Units into the skin at bedtime. 07/20/21   [provider]  valsartan (DIOVAN) 80 MG tablet Take 80 mg by mouth daily.    [provider]  fexofenadine (ALLEGRA) 60 MG tablet Take by mouth.  04/13/21  [provider]     Allergies:     Allergies  Allergen Reactions   Ciprocin-Fluocin-Procin [Fluocinolone]     Nightmares   Lipitor [Atorvastatin] Other (See Comments)    Muscle aches, pain   Lisinopril Cough   Red Yeast Rice [Cholestin] Itching and Rash     Physical Exam:   Vitals  Blood pressure 92/60, pulse 80,  temperature 97.6 F (36.4 C), temperature source Oral, resp. rate 20, height 5\' 7"  (1.702 m), weight 57.6 kg, SpO2 96 %.   1.  General: Patient lying supine in bed, moaning whenever awake    2. Psychiatric: Somnolent and oriented x 3, mood and behavior normal for situation, pleasant and cooperative with exam   3. Neurologic: Speech and language are normal, face is symmetric, moves all 4 extremities voluntarily, at baseline without acute deficits on limited exam   4. HEENMT:  Head is atraumatic, normocephalic, pupils reactive to light, neck is supple, trachea is midline, mucous membranes are moist   5. Respiratory : Lungs are clear to auscultation bilaterally without wheezing, rhonchi, rales, no cyanosis, no increase in work of breathing or accessory muscle use   6. Cardiovascular : Heart rate normal, rhythm is regular, no murmurs, rubs or gallops, no peripheral edema, peripheral pulses palpated, pain with palpation of left ribs   7. Gastrointestinal:  Abdomen is soft, nondistended, nontender to palpation bowel sounds active, no masses or organomegaly palpated   8. Skin:  Skin is warm, dry and intact without rashes, acute lesions, or ulcers on limited exam   9.Musculoskeletal:  No acute deformities or trauma, no asymmetry in tone, no peripheral edema, peripheral pulses palpated, no tenderness to palpation in the extremities     Data Review:    CBC Recent Labs  Lab 10/13/21 2327  WBC 18.1*  HGB 13.4  HCT 40.0  PLT 119*  MCV 97.8  MCH 32.8  MCHC 33.5  RDW 14.6   ------------------------------------------------------------------------------------------------------------------  Results for orders placed  or performed during the hospital encounter of 10/13/21 (from the past 48 hour(s))  CBG monitoring, ED     Status: Abnormal   Collection Time: 10/13/21  9:34 PM  Result Value Ref Range   Glucose-Capillary 479 (H) 70 - 99 mg/dL    Comment: Glucose reference range  applies only to samples taken after fasting for at least 8 hours.  CBG monitoring, ED     Status: Abnormal   Collection Time: 10/13/21 10:50 PM  Result Value Ref Range   Glucose-Capillary 497 (H) 70 - 99 mg/dL    Comment: Glucose reference range applies only to samples taken after fasting for at least 8 hours.  CBC     Status: Abnormal   Collection Time: 10/13/21 11:27 PM  Result Value Ref Range   WBC 18.1 (H) 4.0 - 10.5 K/uL   RBC 4.09 3.87 - 5.11 MIL/uL   Hemoglobin 13.4 12.0 - 15.0 g/dL   HCT 40.0 36.0 - 46.0 %   MCV 97.8 80.0 - 100.0 fL   MCH 32.8 26.0 - 34.0 pg   MCHC 33.5 30.0 - 36.0 g/dL   RDW 14.6 11.5 - 15.5 %   Platelets 119 (L) 150 - 400 K/uL    Comment: Immature Platelet Fraction may be clinically indicated, consider ordering this additional test [id number]    nRBC 0.0 0.0 - 0.2 %    Comment: Performed at [facility], [address], [place], [place] [address]  Comprehensive metabolic panel     Status: Abnormal   Collection Time: 10/13/21 11:43 PM  Result Value Ref Range   Sodium 130 (L) 135 - 145 mmol/L   Potassium 3.7 3.5 - 5.1 mmol/L   Chloride 101 98 - 111 mmol/L   CO2 10 (L) 22 - 32 mmol/L   Glucose, Bld 482 (H) 70 - 99 mg/dL    Comment: Glucose reference range applies only to samples taken after fasting for at least 8 hours.   BUN 21 8 - 23 mg/dL   Creatinine, Ser 0.94 0.44 - 1.00 mg/dL   Calcium 9.2 8.9 - 10.3 mg/dL   Total Protein 6.2 (L) 6.5 - 8.1 g/dL   Albumin 3.4 (L) 3.5 - 5.0 g/dL   AST 15 15 - 41 U/L   ALT 13 0 - 44 U/L   Alkaline Phosphatase 51 38 - 126 U/L   Total Bilirubin 1.6 (H) 0.3 - 1.2 mg/dL   GFR, Estimated >60 >60 mL/min    Comment: (NOTE) Calculated using the CKD-EPI Creatinine Equation (2021)    Anion gap 19 (H) 5 - 15    Comment: Performed at [facility], [address], [place], [place] [address]  Lipase, blood     Status: None   Collection Time: 10/13/21 11:43 PM  Result Value Ref Range   Lipase 17 11 - 51 U/L    Comment:  Performed at [facility], [address] [place], [place] [address]  Troponin I (High Sensitivity)     Status: None   Collection Time: 10/13/21 11:43 PM  Result Value Ref Range   Troponin I (High Sensitivity) 17 <18 ng/L    Comment: (NOTE) Elevated high sensitivity troponin I (hsTnI) values and significant  changes across serial measurements may suggest ACS but many other  chronic and acute conditions are known to elevate hsTnI results.  Refer to the "Links" section for chest pain algorithms and additional  guidance. Performed at [facility], [address], [place], [place] [address]   Magnesium  Status: Abnormal   Collection Time: 10/13/21 11:43 PM  Result Value Ref Range   Magnesium 1.5 (L) 1.7 - 2.4 mg/dL    Comment: Performed at [facility], [address], [place], [place] [address]  Blood gas, venous (at [facility] and [facility], not at [facility])     Status: Abnormal   Collection Time: 10/13/21 11:50 PM  Result Value Ref Range   FIO2 21.00    pH, Ven 7.274 7.250 - 7.430   pCO2, Ven 26.2 (L) 44.0 - 60.0 mmHg   pO2, Ven 58.5 (H) 32.0 - 45.0 mmHg   Bicarbonate 14.0 (L) 20.0 - 28.0 mmol/L   Acid-base deficit 13.8 (H) 0.0 - 2.0 mmol/L   O2 Saturation 86.2 %   Patient temperature 36.4    Sample type VENOUS     Comment: Performed at [facility], [address] [place], [place] [address]  CBG monitoring, ED     Status: Abnormal   Collection Time: 10/14/21  1:05 AM  Result Value Ref Range   Glucose-Capillary 418 (H) 70 - 99 mg/dL    Comment: Glucose reference range applies only to samples taken after fasting for at least 8 hours.  CBG monitoring, ED     Status: Abnormal   Collection Time: 10/14/21  1:35 AM  Result Value Ref Range   Glucose-Capillary 371 (H) 70 - 99 mg/dL    Comment: Glucose reference range applies only to samples taken after fasting for at least 8 hours.  Troponin I (High Sensitivity)     Status: None   Collection Time: 10/14/21  2:00 AM  Result Value Ref Range   Troponin  I (High Sensitivity) 15 <18 ng/L    Comment: (NOTE) Elevated high sensitivity troponin I (hsTnI) values and significant  changes across serial measurements may suggest ACS but many other  chronic and acute conditions are known to elevate hsTnI results.  Refer to the "Links" section for chest pain algorithms and additional  guidance. Performed at [facility], [address], [place], [place] [address]   Beta-hydroxybutyric acid     Status: Abnormal   Collection Time: 10/14/21  2:00 AM  Result Value Ref Range   Beta-Hydroxybutyric Acid 3.69 (H) 0.05 - 0.27 mmol/L    Comment: Performed at [facility], [address] [place], [place] [address]  Resp Panel by RT-PCR (Flu A&B, Covid) Nasopharyngeal Swab     Status: None   Collection Time: 10/14/21  2:04 AM   Specimen: Nasopharyngeal Swab; Nasopharyngeal(NP) swabs in vial transport medium  Result Value Ref Range   SARS Coronavirus 2 by RT PCR NEGATIVE NEGATIVE    Comment: (NOTE) SARS-CoV-2 target nucleic acids are NOT DETECTED.  The SARS-CoV-2 RNA is generally detectable in upper respiratory specimens during the acute phase of infection. The lowest concentration of SARS-CoV-2 viral copies this assay can detect is 138 copies/mL. A negative result does not preclude SARS-Cov-2 infection and should not be used as the sole basis for treatment or other patient management decisions. A negative result may occur with  improper specimen collection/handling, submission of specimen other than nasopharyngeal swab, presence of viral mutation(s) within the areas targeted by this assay, and inadequate number of viral copies(<138 copies/mL). A negative result must be combined with clinical observations, patient history, and epidemiological information. The expected result is Negative.  Fact Sheet for Patients:  EntrepreneurPulse.com.au  Fact Sheet for Healthcare Providers:  IncredibleEmployment.be  This test is no  t yet approved or cleared by the [place] and  has been authorized for detection and/or diagnosis of SARS-CoV-2 by FDA under an Emergency Use Authorization (EUA). This EUA will remain  in effect (meaning this test can be used) for the duration of the COVID-19 declaration under Section 564(b)(1) of the Act, 21 U.S.C.section 360bbb-3(b)(1), unless the authorization is terminated  or revoked sooner.       Influenza A by PCR NEGATIVE NEGATIVE   Influenza B by PCR NEGATIVE NEGATIVE    Comment: (NOTE) The Xpert Xpress SARS-CoV-2/FLU/RSV plus assay is intended as an aid in the diagnosis of influenza from Nasopharyngeal swab specimens and should not be used as a sole basis for treatment. Nasal washings and aspirates are unacceptable for Xpert Xpress SARS-CoV-2/FLU/RSV testing.  Fact Sheet for Patients: EntrepreneurPulse.com.au  Fact Sheet for Healthcare Providers: IncredibleEmployment.be  This test is not yet approved or cleared by the [place] FDA and has been authorized for detection and/or diagnosis of SARS-CoV-2 by FDA under an Emergency Use Authorization (EUA). This EUA will remain in effect (meaning this test can be used) for the duration of the COVID-19 declaration under Section 564(b)(1) of the Act, 21 U.S.C. section 360bbb-3(b)(1), unless the authorization is terminated or revoked.  Performed at [facility], [address] [place], [place] [address]   CBG monitoring, ED     Status: Abnormal   Collection Time: 10/14/21  2:20 AM  Result Value Ref Range   Glucose-Capillary 361 (H) 70 - 99 mg/dL    Comment: Glucose reference range applies only to samples taken after fasting for at least 8 hours.  CBG monitoring, ED     Status: Abnormal   Collection Time: 10/14/21  3:20 AM  Result Value Ref Range   Glucose-Capillary 335 (H) 70 - 99 mg/dL    Comment: Glucose reference range applies only to samples taken after fasting for at  least 8 hours.  CBG monitoring, ED     Status: Abnormal   Collection Time: 10/14/21  4:19 AM  Result Value Ref Range   Glucose-Capillary 276 (H) 70 - 99 mg/dL    Comment: Glucose reference range applies only to samples taken after fasting for at least 8 hours.    Chemistries  Recent Labs  Lab 10/13/21 2343  NA 130*  K 3.7  CL 101  CO2 10*  GLUCOSE 482*  BUN 21  CREATININE 0.94  CALCIUM 9.2  MG 1.5*  AST 15  ALT 13  ALKPHOS 51  BILITOT 1.6*   ------------------------------------------------------------------------------------------------------------------  ------------------------------------------------------------------------------------------------------------------ GFR: Estimated Creatinine Clearance: 46.3 mL/min (by C-G formula based on SCr of 0.94 mg/dL). Liver Function Tests: Recent Labs  Lab 10/13/21 2343  AST 15  ALT 13  ALKPHOS 51  BILITOT 1.6*  PROT 6.2*  ALBUMIN 3.4*   Recent Labs  Lab 10/13/21 2343  LIPASE 17   No results for input(s): AMMONIA in the last 168 hours. Coagulation Profile: No results for input(s): INR, PROTIME in the last 168 hours. Cardiac Enzymes: No results for input(s): CKTOTAL, CKMB, CKMBINDEX, TROPONINI in the last 168 hours. BNP (last 3 results) No results for input(s): PROBNP in the last 8760 hours. HbA1C: No results for input(s): HGBA1C in the last 72 hours. CBG: Recent Labs  Lab 10/14/21 0105 10/14/21 0135 10/14/21 0220 10/14/21 0320 10/14/21 0419  GLUCAP 418* 371* 361* 335* 276*   Lipid Profile: No results for input(s): CHOL, HDL, LDLCALC, TRIG, CHOLHDL, LDLDIRECT in the last 72 hours. Thyroid Function Tests: No results for input(s): TSH, T4TOTAL, FREET4, T3FREE, THYROIDAB in the last 72  hours. Anemia Panel: No results for input(s): VITAMINB12, FOLATE, FERRITIN, TIBC, IRON, RETICCTPCT in the last 72  hours.  --------------------------------------------------------------------------------------------------------------- Urine analysis:    Component Value Date/Time   COLORURINE YELLOW 04/03/2012 2144   APPEARANCEUR Clear 10/02/2021 1400   LABSPEC 1.010 04/03/2012 2144   PHURINE 6.0 04/03/2012 2144   GLUCOSEU Negative 10/02/2021 1400   HGBUR NEGATIVE 04/03/2012 2144   BILIRUBINUR Negative 10/02/2021 1400   KETONESUR NEGATIVE 04/03/2012 2144   PROTEINUR Negative 10/02/2021 1400   PROTEINUR NEGATIVE 04/03/2012 2144   UROBILINOGEN 0.2 04/03/2012 2144   NITRITE Negative 10/02/2021 1400   NITRITE NEGATIVE 04/03/2012 2144   LEUKOCYTESUR Negative 10/02/2021 1400      Imaging Results:    DG Ribs Unilateral W/Chest Left  Result Date: 10/14/2021 CLINICAL DATA:  Left flank pain, left posterior chest pain EXAM: LEFT RIBS AND CHEST - 3+ VIEW COMPARISON:  CT 09/18/2021 FINDINGS: Small left pleural effusion has developed with wedge like opacity within the peripheral left lung base likely representing fluid within the fissure. The lungs are hyperinflated in keeping with changes of underlying COPD. No pneumothorax. Cardiac size within normal limits. Pulmonary vascularity is normal. No acute bone abnormality. Specifically, no rib fracture or destructive rib lesion identified. IMPRESSION: Interval development of small left pleural effusion. COPD Electronically Signed   By: Helyn Numbers M.D.   On: 10/14/2021 00:15   CT Angio Chest PE W and/or Wo Contrast  Result Date: 10/14/2021 CLINICAL DATA:  PE suspected, high prob.  Hypoxia. EXAM: CT ANGIOGRAPHY CHEST WITH CONTRAST TECHNIQUE: Multidetector CT imaging of the chest was performed using the standard protocol during bolus administration of intravenous contrast. Multiplanar CT image reconstructions and MIPs were obtained to evaluate the vascular anatomy. CONTRAST:  OMNIPAQUE IOHEXOL 350 MG/ML SOLN COMPARISON:  None. FINDINGS: Cardiovascular:  Adequate opacification of the pulmonary arterial tree. No intraluminal filling defect identified to suggest acute pulmonary embolism. The central pulmonary arteries are of normal caliber. Extensive multi-vessel coronary artery calcification. Global cardiac size within normal limits. No pericardial effusion. Mild atherosclerotic calcification within the thoracic aorta. No aortic aneurysm. Mediastinum/Nodes: Shotty aortopulmonary window lymphadenopathy may be reactive in nature. No frankly pathologic thoracic adenopathy identified. Esophagus is unremarkable. Lungs/Pleura: There is dense consolidation within the lingula, in keeping with acute lobar pneumonia in the appropriate clinical setting. Scattered additional infiltrate noted within the left upper lobe. Small left pleural effusion is present, possibly representing a small parapneumonic effusion. Mild associated left lower lobe compressive atelectasis. Right lung is clear. No pneumothorax. No pleural effusion on the right. There is airway impaction segmentally within the left lower lobe centrally. Upper Abdomen: No acute abnormality. Musculoskeletal: No acute bone abnormality. No lytic or blastic bone lesion. Osseous structures are age-appropriate. Review of the MIP images confirms the above findings. IMPRESSION: Dense consolidation within the lingula compatible with acute lobar pneumonia in the appropriate clinical setting. Small associated left pleural effusion may represent a parapneumonic effusion. No pulmonary embolism. Extensive multi-vessel coronary artery calcification. Aortic Atherosclerosis (ICD10-I70.0). Electronically Signed   By: Helyn Numbers M.D.   On: 10/14/2021 02:42    My personal review of EKG: Rhythm NSR, Rate 88/min, QTc 442 ,no Acute ST changes   Assessment & Plan:    Active Problems:   Benign hypertension   Hypothyroidism   GERD (gastroesophageal reflux disease)   DKA (diabetic ketoacidosis) (HCC)   Hypomagnesemia    Hypoalbuminemia   Community acquired pneumonia   DKA In type II diabetic Likely triggered by pneumonia pH 7.2,  glucose 182, bicarb 10, anion gap 19 Endo tool initiated Continue insulin drip and fluids Continue to monitor CBG Admit to stepdown Community-acquired pneumonia Acute lobar pneumonia on CTA Continue Rocephin and Zithromax Culture expectorated sputum Urine antigens Continue to monitor Hypothyroidism Continue Synthroid GERD Continue Protonix Hypomagnesemia Replaced in the ED recheck in the a.m. Hypoalbuminemia When patient is able to tolerate p.o. encourage nutrient dense food choices Hypertension Continue losartan, amlodipine Coronary artery disease Continue aspirin, Brilinta, statin, metoprolol   DVT Prophylaxis-   Heparin - SCDs   AM Labs Ordered, also please review Full Orders  Family Communication: No family at bedside  Code Status: Full  Admission status: Inpatient :The appropriate admission status for this patient is INPATIENT. Inpatient status is judged to be reasonable and necessary in order to provide the required intensity of service to ensure the patient's safety. The patient's presenting symptoms, physical exam findings, and initial radiographic and laboratory data in the context of their chronic comorbidities is felt to place them at high risk for further clinical deterioration. Furthermore, it is not anticipated that the patient will be medically stable for discharge from the hospital within 2 midnights of admission. The following factors support the admission status of inpatient.     The patient's presenting symptoms include generalized weakness and cough. The worrisome physical exam findings include weakness and pain to palpation of left ribs. The initial radiographic and laboratory data are worrisome because of DKA labs. The chronic co-morbidities include diabetes mellitus type 2, hypothyroidism, hypertension, GERD.       * I certify that  at the point of admission it is my clinical judgment that the patient will require inpatient hospital care spanning beyond 2 midnights from the point of admission due to high intensity of service, high risk for further deterioration and high frequency of surveillance required.*  Disposition: Anticipated Discharge  2-3 days discharge to home  Time spent in minutes : [place] DO

## 2021-10-14 NOTE — ED Notes (Signed)
Per Dr. Renato Battles, give loading dose of insulin with SSI and stop insulin drip after one hour. MD is aware of labs and aware of endo tool recommendation to continue insulin drip due to beta.

## 2021-10-15 DIAGNOSIS — E1110 Type 2 diabetes mellitus with ketoacidosis without coma: Secondary | ICD-10-CM | POA: Diagnosis not present

## 2021-10-15 DIAGNOSIS — J189 Pneumonia, unspecified organism: Secondary | ICD-10-CM | POA: Diagnosis not present

## 2021-10-15 DIAGNOSIS — K219 Gastro-esophageal reflux disease without esophagitis: Secondary | ICD-10-CM | POA: Diagnosis not present

## 2021-10-15 DIAGNOSIS — I10 Essential (primary) hypertension: Secondary | ICD-10-CM | POA: Diagnosis not present

## 2021-10-15 LAB — BASIC METABOLIC PANEL
Anion gap: 7 (ref 5–15)
BUN: 12 mg/dL (ref 8–23)
CO2: 19 mmol/L — ABNORMAL LOW (ref 22–32)
Calcium: 8.4 mg/dL — ABNORMAL LOW (ref 8.9–10.3)
Chloride: 104 mmol/L (ref 98–111)
Creatinine, Ser: 0.47 mg/dL (ref 0.44–1.00)
GFR, Estimated: >60 mL/min (ref >60)
Glucose, Bld: 106 mg/dL — ABNORMAL HIGH (ref 70–99)
Potassium: 3.1 mmol/L — ABNORMAL LOW (ref 3.5–5.1)
Sodium: 130 mmol/L — ABNORMAL LOW (ref 135–145)

## 2021-10-15 LAB — CBC
HCT: 42 % (ref 36.0–46.0)
Hemoglobin: 14.6 g/dL (ref 12.0–15.0)
MCH: 32 pg (ref 26.0–34.0)
MCHC: 34.8 g/dL (ref 30.0–36.0)
MCV: 92.1 fL (ref 80.0–100.0)
Platelets: 104 10*3/uL — ABNORMAL LOW (ref 150–400)
RBC: 4.56 MIL/uL (ref 3.87–5.11)
RDW: 14.4 % (ref 11.5–15.5)
WBC: 12.1 10*3/uL — ABNORMAL HIGH (ref 4.0–10.5)
nRBC: 0 % (ref 0.0–0.2)

## 2021-10-15 LAB — PHOSPHORUS: Phosphorus: 1.2 mg/dL — ABNORMAL LOW (ref 2.5–4.6)

## 2021-10-15 LAB — GLUCOSE, CAPILLARY
Glucose-Capillary: 73 mg/dL (ref 70–99)
Glucose-Capillary: 193 mg/dL — ABNORMAL HIGH (ref 70–99)
Glucose-Capillary: 182 mg/dL — ABNORMAL HIGH (ref 70–99)
Glucose-Capillary: 329 mg/dL — ABNORMAL HIGH (ref 70–99)

## 2021-10-15 LAB — MAGNESIUM: Magnesium: 1.6 mg/dL — ABNORMAL LOW (ref 1.7–2.4)

## 2021-10-15 MED ORDER — K PHOS MONO-SOD PHOS DI & MONO 155-852-130 MG PO TABS
250.0000 mg | ORAL_TABLET | Freq: Two times a day (BID) | ORAL | Status: AC
  Administered 2021-10-15 (×2): 250 mg via ORAL
  Filled 2021-10-15 (×2): qty 1

## 2021-10-15 MED ORDER — METOPROLOL SUCCINATE ER 50 MG PO TB24
50.0000 mg | ORAL_TABLET | Freq: Every evening | ORAL | Status: DC
  Administered 2021-10-15 – 2021-10-16 (×2): 50 mg via ORAL
  Filled 2021-10-15 (×3): qty 1

## 2021-10-15 MED ORDER — POTASSIUM CHLORIDE CRYS ER 20 MEQ PO TBCR
40.0000 meq | EXTENDED_RELEASE_TABLET | Freq: Once | ORAL | Status: AC
  Administered 2021-10-15: 40 meq via ORAL
  Filled 2021-10-15: qty 2

## 2021-10-15 MED ORDER — AZITHROMYCIN 250 MG PO TABS
500.0000 mg | ORAL_TABLET | Freq: Every day | ORAL | Status: DC
  Administered 2021-10-16 – 2021-10-17 (×2): 500 mg via ORAL
  Filled 2021-10-15 (×2): qty 2

## 2021-10-15 MED ORDER — MAGNESIUM SULFATE 2 GM/50ML IV SOLN
2.0000 g | Freq: Once | INTRAVENOUS | Status: AC
  Administered 2021-10-15: 2 g via INTRAVENOUS
  Filled 2021-10-15: qty 50

## 2021-10-15 NOTE — Progress Notes (Signed)
Inpatient Diabetes Program Recommendations  AACE/ADA: New Consensus Statement on Inpatient Glycemic Control (2015)  Target Ranges:  Prepandial:   less than 140 mg/dL      Peak postprandial:   less than 180 mg/dL (1-2 hours)      Critically ill patients:  140 - 180 mg/dL   Results for KEYRI, SALBERG (MRN [id number]) as of 10/15/2021 08:33  Ref. Range 10/14/2021 07:05 10/14/2021 08:05 10/14/2021 09:05 10/14/2021 10:13 10/14/2021 11:25 10/14/2021 16:41 10/14/2021 21:20  Glucose-Capillary Latest Ref Range: 70 - 99 mg/dL 017 (H) 494 (H) 496 (H) 187 (H) 184 (H) 146 (H) 135 (H)  Results for ENYA, BUREAU (MRN [id number]) as of 10/15/2021 08:33  Ref. Range 10/15/2021 07:37  Glucose-Capillary Latest Ref Range: 70 - 99 mg/dL 73    Admit with: DKA/ Pneumonia   History: DM, COPD   Home DM Meds: Tresiba 20 units QHS                              Humalog 5-8 units TID                              Metformin 1000 mg BID   Current Orders: Levemir 20 units QHS                            Novolog Sensitive Correction Scale/ SSI (0-9 units) TID AC      Endocrinologist: Ronny Bacon, NP with [facility] Last seen 08/17/2021 Was told to take the following: Tresiba 20 units QHS Humalog 5-8 units TID Metformin 1000 mg BID     MD- Note pt transitioned to SQ Insulin yesterday AM.  CBG 73 this AM  Please consider reducing Levemir to 15 units QHS    --Will follow patient during hospitalization--  Ambrose Finland RN, MSN, CDE Diabetes Coordinator Inpatient Glycemic Control Team Team Pager: [phone] (8a-5p)

## 2021-10-15 NOTE — Progress Notes (Signed)
PROGRESS NOTE    Patient: Gabrielle Wood                            PCP: [place], MD                    DOB: 1946-09-04            DOA: 10/13/2021 EZM:[id number]             DOS: 10/15/2021, 9:33 AM   LOS: 1 day   Date of Service: The patient was seen and examined on 10/15/2021  Subjective:   The patient was seen and examined this morning, stable no acute distress, overnight had a brief episode of tachycardia, was placed on Cardizem drip was transferred to stepdown unit  Heart rate has much improved, in normal sinus rhythm this morning, no acute distress, stable denies any chest pain palpitation or shortness of breath... Complaining of severe generalized weaknesses  Brief Narrative:   Gabrielle Wood  is a 75 y.o. female, with history of COPD, diabetes mellitus type 2, DVT, H. pylori infection, high cholesterol, hypertension, hypothyroidism, NSTEMI, thyroid disease ...  presents the ED with a chief complaint of hyperglycemia.   Patient reports that she is here because of hyperglycemia.  She reports sugar at home was over 600.   ED: Temp 97.6, HR 75-93, RR 20-29, BP 133/68, satting at 97% Patient has a leukocytosis of 18.1 hemoglobin 13.4 Chemistry panel reveals a decreased bicarb at 10, pseudohyponatremia, and glucose of 482 with a gap of 19 VBG shows a pH of 7.2, PCO2 26 Patient did have CTA of her chest which showed no pulmonary embolism but did show an acute lobar pneumonia Beta hydroxybutyrate is elevated - T bili is elevated Patient was given 10 units of NovoLog, started on insulin drip, started on Rocephin and Zithromax, given 2.152 L of fluid, and Endo tool initiated Admitted for DKA and pneumonia . Assessment & Plan:   Active Problems:   Benign hypertension   Hypothyroidism   GERD (gastroesophageal reflux disease)   DKA (diabetic ketoacidosis) (HCC)   Hypomagnesemia   Hypoalbuminemia   Community acquired pneumonia   Diabetic ketoacidosis -stable  uncontrolled diabetes mellitus type 2 -Diabetic ketoacidosis physiology resolved -Tolerating p.o., initiating long-acting insulin, along with SSI  -On admission: decreased bicarb at 10, pseudohyponatremia, and glucose of 482 with a gap of 19, VBG shows a pH of 7.2, PCO2 26 -Status post treatment for DKA with protocol IV fluid resuscitation, insulin drip  -Hyperglycemia, DKA complicated as result of pneumonia -Reinitiating home regimen of Tresiba, but holding metformin for now  Community-acquired pneumonia -SIRS physiology improved, hemodynamically stable, afebrile normotensive,  -On admission: Met SIRS criteria: afebrile, mildly tachypneic, tachycardic, WBC 18.1, satting 97% on room air, with metabolic acidosis -Continue IV fluid resuscitation, broad-spectrum antibiotics Rocephin, azithromycin -Follow-up with blood and sputum cultures    Brief episode of supraventricular tachycardia  -Overnight patient was noted for tachycardia, -He was placed on Cardizem drip, normal sinus rhythm this morning -Patient's Toprol-XL was increased to 50 mg daily -Electrolytes were repleted, currently stable   hypomagnesemia/hypokalemia/hypophosphatemia -Monitoring, repleting both with 40 mEq KCl, 2 g of magnesium, K-Phos p.o.  Hypothyroidism -We will continue Synthroid -Stable  Hypoalbuminemia -Monitoring repleted, encouraging p.o. intake Nutrition supplements  Hypertension -Remained stable, continue home medication losartan, amlodipine  H/o coronary artery disease with stent In January 2022 -Currently stable continue home medication: Aspirin, Brilinta, statins,  metoprolol,  GERD -Continue PPI,   Transferring out of ICU /stepdown unit 09/14/2021  ------------------------------------------------------------------------------------------------------------------------------ Cultures; Blood Cultures x 2 >> NGT Sputum Culture >> NGT   Antimicrobials: Antibiotics IV Rocephin,  azithromycin   Consultants: None  --------------------------------------------------------------------------------------------------------------------------------  DVT prophylaxis:  SCD/Compression stockings and Heparin SQ Code Status:   Code Status: Full Code  Family Communication: No family member present at bedside- attempt will be made to update daily The above findings and plan of care has been discussed with patient (and family)  in detail,  they expressed understanding and agreement of above. -Advance care planning has been discussed.   Admission status:   Status is: Inpatient  Remains inpatient appropriate because: Requiring aggressive IV treatment for diabetic ketoacidosis, fluids, insulin drip, will require IV antibiotics for pneumonia.  Electrolyte replacement Monitoring heart rate adjusting meds  Disposition: From home          Anticipating discharging home with home health in 1-2 days    Level of care: Telemetry   Procedures:   No admission procedures for hospital encounter.  Do not  Antimicrobials:  Anti-infectives (From admission, onward)    Start     Dose/Rate Route Frequency Ordered Stop   10/16/21 1000  azithromycin (ZITHROMAX) tablet 500 mg        500 mg Oral Daily 10/15/21 0720     10/15/21 0300  cefTRIAXone (ROCEPHIN) 1 g in sodium chloride 0.9 % 100 mL IVPB        1 g 200 mL/hr over 30 Minutes Intravenous Every 24 hours 10/14/21 0544     10/14/21 0300  cefTRIAXone (ROCEPHIN) 2 g in sodium chloride 0.9 % 100 mL IVPB        2 g 200 mL/hr over 30 Minutes Intravenous  Once 10/14/21 0248 10/14/21 0353   10/14/21 0300  azithromycin (ZITHROMAX) 500 mg in sodium chloride 0.9 % 250 mL IVPB  Status:  Discontinued        500 mg 250 mL/hr over 60 Minutes Intravenous Every 24 hours 10/14/21 0248 10/15/21 0720        Medication:   aspirin EC  81 mg Oral Daily   [START ON 10/16/2021] azithromycin  500 mg Oral Daily   Chlorhexidine Gluconate Cloth  6  each Topical Daily   gabapentin  300 mg Oral TID   heparin  5,000 Units Subcutaneous Q8H   insulin aspart  0-9 Units Subcutaneous TID WC   insulin detemir  20 Units Subcutaneous QHS   irbesartan  75 mg Oral Daily   levothyroxine  125 mcg Oral Daily   lidocaine  1 patch Transdermal Q24H   metoprolol succinate  50 mg Oral QPM   pantoprazole  40 mg Oral Daily   phosphorus  250 mg Oral BID   ticagrelor  90 mg Oral BID    acetaminophen, ALPRAZolam, dextrose, oxyCODONE   Objective:   Vitals:   10/15/21 0700 10/15/21 0800 10/15/21 0854 10/15/21 0900  BP: 140/81 (!) 140/99  127/73  Pulse: 84 98  84  Resp: (!) 28 20    Temp:   (!) 97.5 F (36.4 C)   TempSrc:   Axillary   SpO2: 96% 94%  96%  Weight:      Height:        Intake/Output Summary (Last 24 hours) at 10/15/2021 0933 Last data filed at 10/15/2021 0700 Gross per 24 hour  Intake 2913.71 ml  Output --  Net 2913.71 ml   Filed Weights   10/13/21 2138  Weight: 57.6 kg     Examination:      Physical Exam:   General:  Alert, oriented, cooperative, no distress;   HEENT:  Normocephalic, PERRL, otherwise with in Normal limits   Neuro:  CNII-XII intact. , normal motor and sensation, reflexes intact   Lungs:   Clear to auscultation BL, Respirations unlabored, no wheezes / crackles  Cardio:    S1/S2, RRR, No murmure, No Rubs or Gallops   Abdomen:   Soft, non-tender, bowel sounds active all four quadrants,  no guarding or peritoneal signs.  Muscular skeletal:  Limited exam - in bed, able to move all 4 extremities, global generalized weakness,  2+ pulses,  symmetric, No pitting edema  Skin:  Dry, warm to touch, negative for any Rashes,  Wounds: Please see nursing documentation            ------------------------------------------------------------------------------------------------------------------------------------------    LABs:  CBC Latest Ref Rng & Units 10/15/2021 10/13/2021 04/13/2021  WBC 4.0 - 10.5  K/uL 12.1(H) 18.1(H) 5.9  Hemoglobin 12.0 - 15.0 g/dL 14.6 13.4 13.8  Hematocrit 36.0 - 46.0 % 42.0 40.0 41.3  Platelets 150 - 400 K/uL 104(L) 119(L) 151   CMP Latest Ref Rng & Units 10/15/2021 10/14/2021 10/14/2021  Glucose 70 - 99 mg/dL 106(H) 138(H) 164(H)  BUN 8 - 23 mg/dL 12 19 22   Creatinine 0.44 - 1.00 mg/dL 0.47 0.61 0.72  Sodium 135 - 145 mmol/L 130(L) 131(L) 131(L)  Potassium 3.5 - 5.1 mmol/L 3.1(L) 3.5 3.7  Chloride 98 - 111 mmol/L 104 104 105  CO2 22 - 32 mmol/L 19(L) 18(L) 17(L)  Calcium 8.9 - 10.3 mg/dL 8.4(L) 8.6(L) 9.1  Total Protein 6.5 - 8.1 g/dL - - -  Total Bilirubin 0.3 - 1.2 mg/dL - - -  Alkaline Phos 38 - 126 U/L - - -  AST 15 - 41 U/L - - -  ALT 0 - 44 U/L - - -       Micro Results Recent Results (from the past 240 hour(s))  Resp Panel by RT-PCR (Flu A&B, Covid) Nasopharyngeal Swab     Status: None   Collection Time: 10/14/21  2:04 AM   Specimen: Nasopharyngeal Swab; Nasopharyngeal(NP) swabs in vial transport medium  Result Value Ref Range Status   SARS Coronavirus 2 by RT PCR NEGATIVE NEGATIVE Final    Comment: (NOTE) SARS-CoV-2 target nucleic acids are NOT DETECTED.  The SARS-CoV-2 RNA is generally detectable in upper respiratory specimens during the acute phase of infection. The lowest concentration of SARS-CoV-2 viral copies this assay can detect is 138 copies/mL. A negative result does not preclude SARS-Cov-2 infection and should not be used as the sole basis for treatment or    Influenza A by PCR NEGATIVE NEGATIVE Final   Influenza B by PCR NEGATIVE NEGATIVE Final    Comment: (NOTE) The Xpert Xpress SARS-CoV-2/FLU/RSV plus assay is intended a    Radiology Reports DG Ribs Unilateral W/Chest Left  Result Date: 10/14/2021 CLINICAL DATA:  Left flank pain, left posterior chest pain EXAM: LEFT RIBS AND CHEST - 3+ VIEW COMPARISON:  CT 09/18/2021 FINDINGS: Small left pleural effusion has developed with wedge like opacity within the peripheral  left lung base likely representing fluid within the fissure. The lungs are hyperinflated in keeping with changes of underlying COPD. No pneumothorax. Cardiac size within normal limits. Pulmonary vascularity is normal. No acute bone abnormality. Specifically, no rib fracture or destructive rib lesion identified. IMPRESSION: Interval development of small left pleural effusion. COPD Electronically  Signed   By: Fidela Salisbury M.D.   On: 10/14/2021 00:15   CT Angio Chest PE W and/or Wo Contrast  Result Date: 10/14/2021 CLINICAL DATA:  PE suspected, high prob.  Hypoxia. EXAM: CT ANGIOGRAPHY CHEST WITH CONTRAST TECHNIQUE: Multidetector CT imaging of the chest was performed using the standard protocol during bolus administration of intravenous contrast. Multiplanar CT image reconstructions and MIPs were obtained to evaluate the vascular anatomy. CONTRAST:  157m O . IMPRESSION: Dense consolidation within the lingula compatible with acute lobar pneumonia in the appropriate clinical setting. Small associated left pleural effusion may represent a parapneumonic effusion. No pulmonary embolism. Extensive multi-vessel coronary artery calcification. Aortic Atherosclerosis (ICD10-I70.0). Electronically Signed   By: AFidela SalisburyM.D.   On: 10/14/2021 02:42    SIGNED: SDeatra James MD, FHM. [facility],  Pager (please use [email or web address] to page/text) Please use Epic Secure Chat for non-urgent communication (7AM-7PM)  If 7PM-7AM, please contact night-coverage [email or web address], 10/15/2021, 9:33 AM

## 2021-10-16 DIAGNOSIS — L89300 Pressure ulcer of unspecified buttock, unstageable: Secondary | ICD-10-CM

## 2021-10-16 DIAGNOSIS — L8990 Pressure ulcer of unspecified site, unspecified stage: Secondary | ICD-10-CM

## 2021-10-16 DIAGNOSIS — E8809 Other disorders of plasma-protein metabolism, not elsewhere classified: Secondary | ICD-10-CM | POA: Diagnosis not present

## 2021-10-16 DIAGNOSIS — J189 Pneumonia, unspecified organism: Secondary | ICD-10-CM | POA: Diagnosis not present

## 2021-10-16 DIAGNOSIS — I10 Essential (primary) hypertension: Secondary | ICD-10-CM | POA: Diagnosis not present

## 2021-10-16 HISTORY — DX: Pressure ulcer of unspecified site, unspecified stage: L89.90

## 2021-10-16 LAB — GLUCOSE, CAPILLARY
Glucose-Capillary: 170 mg/dL — ABNORMAL HIGH (ref 70–99)
Glucose-Capillary: 303 mg/dL — ABNORMAL HIGH (ref 70–99)
Glucose-Capillary: 226 mg/dL — ABNORMAL HIGH (ref 70–99)
Glucose-Capillary: 302 mg/dL — ABNORMAL HIGH (ref 70–99)

## 2021-10-16 MED ORDER — POTASSIUM CHLORIDE CRYS ER 20 MEQ PO TBCR
40.0000 meq | EXTENDED_RELEASE_TABLET | Freq: Once | ORAL | Status: AC
  Administered 2021-10-16: 40 meq via ORAL
  Filled 2021-10-16: qty 2

## 2021-10-16 MED ORDER — MAGNESIUM SULFATE 2 GM/50ML IV SOLN
2.0000 g | Freq: Once | INTRAVENOUS | Status: AC
  Administered 2021-10-16: 2 g via INTRAVENOUS
  Filled 2021-10-16: qty 50

## 2021-10-16 MED ORDER — DOCUSATE SODIUM 100 MG PO CAPS
100.0000 mg | ORAL_CAPSULE | Freq: Two times a day (BID) | ORAL | Status: DC
  Administered 2021-10-16 – 2021-10-17 (×3): 100 mg via ORAL
  Filled 2021-10-16 (×3): qty 1

## 2021-10-16 NOTE — Plan of Care (Signed)
  Problem: Acute Rehab OT Goals (only OT should resolve) Goal: Pt. Will Perform Grooming Flowsheets (Taken 10/16/2021 0939) Pt Will Perform Grooming:  with modified independence  standing Goal: Pt. Will Perform Upper Body Bathing Flowsheets (Taken 10/16/2021 0939) Pt Will Perform Upper Body Bathing:  with supervision  sitting Goal: Pt. Will Perform Lower Body Bathing Flowsheets (Taken 10/16/2021 0939) Pt Will Perform Lower Body Bathing:  with supervision  sitting/lateral leans  sit to/from stand Goal: Pt. Will Perform Upper Body Dressing Flowsheets (Taken 10/16/2021 0939) Pt Will Perform Upper Body Dressing:  with supervision  sitting Goal: Pt. Will Perform Lower Body Dressing Flowsheets (Taken 10/16/2021 0939) Pt Will Perform Lower Body Dressing:  with supervision  sitting/lateral leans  sit to/from stand Goal: Pt. Will Transfer To Toilet Flowsheets (Taken 10/16/2021 [phone]) Pt Will Transfer to Toilet:  with supervision  bedside commode  ambulating  stand pivot transfer Goal: Pt. Will Perform Toileting-Clothing Manipulation Flowsheets (Taken 10/16/2021 0939) Pt Will Perform Toileting - Clothing Manipulation and hygiene:  with supervision  sitting/lateral leans  sit to/from stand

## 2021-10-16 NOTE — Progress Notes (Signed)
PROGRESS NOTE    Patient: Gabrielle Wood                            PCP: Truesdale Nation, MD                    DOB: [birth year]            DOA: 10/13/2021 PRF:[id number]             DOS: 10/16/2021, 11:04 AM   LOS: 2 days   Date of Service: The patient was seen and examined on 10/16/2021  Subjective:   The patient was seen and examined this morning, much improved mentation, mildly lethargic  still following commands.  Reporting improved shortness of breath.  Denies any chest pain  Brief Narrative:   Gabrielle Wood  is a 75 y.o. female, with history of COPD, diabetes mellitus type 2, DVT, H. pylori infection, high cholesterol, hypertension, hypothyroidism, NSTEMI, thyroid disease ...  presents the ED with a chief complaint of hyperglycemia.   Patient reports that she is here because of hyperglycemia.  She reports sugar at home was over 600.   ED: Temp 97.6, HR 75-93, RR 20-29, BP 133/68, satting at 97% Patient has a leukocytosis of 18.1 hemoglobin 13.4 Chemistry panel reveals a decreased bicarb at 10, pseudohyponatremia, and glucose of 482 with a gap of 19 VBG shows a pH of 7.2, PCO2 26 Patient did have CTA of her chest which showed no pulmonary embolism but did show an acute lobar pneumonia Beta hydroxybutyrate is elevated - T bili is elevated Patient was given 10 units of NovoLog, started on insulin drip, started on Rocephin and Zithromax, given 2.152 L of fluid, and Endo tool initiated Admitted for DKA and pneumonia . Assessment & Plan:   Active Problems:   Benign hypertension   Hypothyroidism   GERD (gastroesophageal reflux disease)   DKA (diabetic ketoacidosis) (HCC)   Hypomagnesemia   Hypoalbuminemia   Community acquired pneumonia   Pressure injury of skin   Diabetic ketoacidosis -stable uncontrolled diabetes mellitus type 2 -Diabetic ketoacidosis physiology resolved--- able to tolerate p.o. -Advancing diet as tolerated, continuing SSI coverage, CBG q. ACH  S -Initiated Levemir 20 units nightly  -On admission:  Decreased bicarb at 10, pseudohyponatremia, and glucose of 482 with a gap of 19, VBG shows a pH of 7.2, PCO2 26 -Status post treatment for DKA with protocol IV fluid resuscitation, insulin drip  -Hyperglycemia, DKA complicated as result of pneumonia -Reinitiating home regimen of Tresiba, but holding metformin for now  Community-acquired pneumonia -SIRS physiology improved, currently afebrile, normotensive  -On admission: Met SIRS criteria: afebrile, mildly tachypneic, tachycardic, WBC 18.1, satting 97% on room air, with metabolic acidosis -Continue IV fluid resuscitation, broad-spectrum antibiotics Rocephin, azithromycin -Follow-up with blood and sputum cultures >>> no growth to date    Brief episode of supraventricular tachycardia  --Episode on this admission, resolved -He was placed on Cardizem drip overnight on 11/13/ 22, normal sinus rhythm now -Patient's Toprol-XL was increased to 50 mg daily -Electrolytes were repleted, currently stable   hypomagnesemia/hypokalemia/hypophosphatemia -Continue monitoring, potassium magnesium and phosphorus were repleted   Hypothyroidism -We will continue Synthroid -Stable  Hypoalbuminemia -Monitoring repleted, encouraging p.o. intake Nutrition supplements  Hypertension -Remained stable, continue home medication losartan, amlodipine  H/o coronary artery disease with stent In January 2022 -Currently stable continue home medication: Aspirin, Brilinta, statins, metoprolol,  GERD -Continue PPI,   Transferring out of  ICU /stepdown10/13/2022  ------------------------------------------------------------------------------------------------------------------------------ Cultures; Blood Cultures x 2 >> NGT Sputum Culture >> NGT   Antimicrobials: Antibiotics IV Rocephin, azithromycin   Consultants:  None  --------------------------------------------------------------------------------------------------------------------------------  DVT prophylaxis:  SCD/Compression stockings and Heparin SQ Code Status:   Code Status: Full Code  Family Communication: No family member present at bedside- attempt will be made to update daily The above findings and plan of care has been discussed with patient (and family)  in detail,  they expressed understanding and agreement of above. -Advance care planning has been discussed.   Admission status:   Status is: Inpatient  Remains inpatient appropriate because: Requiring aggressive IV treatment for diabetic ketoacidosis, fluids, insulin drip, will require IV antibiotics for pneumonia.  Electrolyte replacement Monitoring heart rate adjusting meds  Disposition: From home          Anticipating discharging home with home health in 1 day -if remains stable    Level of care: Telemetry   Procedures:   No admission procedures for hospital encounter.  Do not  Antimicrobials:  Anti-infectives (From admission, onward)    Start     Dose/Rate Route Frequency Ordered Stop   10/16/21 1000  azithromycin (ZITHROMAX) tablet 500 mg        500 mg Oral Daily 10/15/21 0720     10/15/21 0300  cefTRIAXone (ROCEPHIN) 1 g in sodium chloride 0.9 % 100 mL IVPB        1 g 200 mL/hr over 30 Minutes Intravenous Every 24 hours 10/14/21 0544     10/14/21 0300  cefTRIAXone (ROCEPHIN) 2 g in sodium chloride 0.9 % 100 mL IVPB        2 g 200 mL/hr over 30 Minutes Intravenous  Once 10/14/21 0248 10/14/21 0353   10/14/21 0300  azithromycin (ZITHROMAX) 500 mg in sodium chloride 0.9 % 250 mL IVPB  Status:  Discontinued        500 mg 250 mL/hr over 60 Minutes Intravenous Every 24 hours 10/14/21 0248 10/15/21 0720        Medication:   aspirin EC  81 mg Oral Daily   azithromycin  500 mg Oral Daily   Chlorhexidine Gluconate Cloth  6 each Topical Daily   docusate sodium   100 mg Oral BID   gabapentin  300 mg Oral TID   heparin  5,000 Units Subcutaneous Q8H   insulin aspart  0-9 Units Subcutaneous TID WC   insulin detemir  20 Units Subcutaneous QHS   irbesartan  75 mg Oral Daily   levothyroxine  125 mcg Oral Daily   lidocaine  1 patch Transdermal Q24H   metoprolol succinate  50 mg Oral QPM   pantoprazole  40 mg Oral Daily   ticagrelor  90 mg Oral BID    acetaminophen, ALPRAZolam, dextrose, oxyCODONE   Objective:   Vitals:   10/15/21 1200 10/15/21 1307 10/15/21 2105 10/16/21 0541  BP: (!) 158/90 136/83 (!) 141/88 138/82  Pulse: 89 85 79 73  Resp:   17 17  Temp:  98.3 F (36.8 C) (!) 100.8 F (38.2 C) 97.9 F (36.6 C)  TempSrc:  Oral Oral Oral  SpO2: 98% 99% 97% 96%  Weight:      Height:        Intake/Output Summary (Last 24 hours) at 10/16/2021 1104 Last data filed at 10/16/2021 0900 Gross per 24 hour  Intake 583.8 ml  Output 1825 ml  Net -1241.2 ml   Filed Weights   10/13/21 2138  Weight: 57.6 kg  Examination:     Physical Exam:   General:  Mildly lethargic, arousable, following commands  HEENT:  Normocephalic, PERRL, otherwise with in Normal limits   Neuro:  CNII-XII intact. , normal motor and sensation, reflexes intact   Lungs:   Clear to auscultation BL, Respirations unlabored, no wheezes / crackles  Cardio:    S1/S2, RRR, No murmure, No Rubs or Gallops   Abdomen:   Soft, non-tender, bowel sounds active all four quadrants,  no guarding or peritoneal signs.  Muscular skeletal:  Limited exam - in bed, able to move all 4 extremities, diffuse global generalized (,  2+ pulses,  symmetric, No pitting edema  Skin:  Dry, warm to touch, negative for any Rashes, sacrum stage II pressure ulcer   Wounds: Please see nursing documentation  Pressure Injury 10/15/21 Sacrum Medial Stage 2 -  Partial thickness loss of dermis presenting as a shallow open injury with a red, pink wound bed without slough. 1 x 2cm area with top layer of  skin missing, surrounding area red (Active)  10/15/21 1414  Location: Sacrum  Location Orientation: Medial  Staging: Stage 2 -  Partial thickness loss of dermis presenting as a shallow open injury with a red, pink wound bed without slough.  Wound Description (Comments): 1 x 2cm area with top layer of skin missing, surrounding area red  Present on Admission: Yes           ------------------------------------------------------------------------------------------------------------------------------------------    LABs:  CBC Latest Ref Rng & Units 10/15/2021 10/13/2021 04/13/2021  WBC 4.0 - 10.5 K/uL 12.1(H) 18.1(H) 5.9  Hemoglobin 12.0 - 15.0 g/dL 14.6 13.4 13.8  Hematocrit 36.0 - 46.0 % 42.0 40.0 41.3  Platelets 150 - 400 K/uL 104(L) 119(L) 151   CMP Latest Ref Rng & Units 10/15/2021 10/14/2021 10/14/2021  Glucose 70 - 99 mg/dL 106(H) 138(H) 164(H)  BUN 8 - 23 mg/dL 12 19 22   Creatinine 0.44 - 1.00 mg/dL 0.47 0.61 0.72  Sodium 135 - 145 mmol/L 130(L) 131(L) 131(L)  Potassium 3.5 - 5.1 mmol/L 3.1(L) 3.5 3.7  Chloride 98 - 111 mmol/L 104 104 105  CO2 22 - 32 mmol/L 19(L) 18(L) 17(L)  Calcium 8.9 - 10.3 mg/dL 8.4(L) 8.6(L) 9.1  Total Protein 6.5 - 8.1 g/dL - - -  Total Bilirubin 0.3 - 1.2 mg/dL - - -  Alkaline Phos 38 - 126 U/L - - -  AST 15 - 41 U/L - - -  ALT 0 - 44 U/L - - -       Micro Results Recent Results (from the past 240 hour(s))  Resp Panel by RT-PCR (Flu A&B, Covid) Nasopharyngeal Swab     Status: None   Collection Time: 10/14/21  2:04 AM   Specimen: Nasopharyngeal Swab; Nasopharyngeal(NP) swabs in vial transport medium  Result Value Ref Range Status   SARS Coronavirus 2 by RT PCR NEGATIVE NEGATIVE Final    Comment: (NOTE) SARS-CoV-2 target nucleic acids are NOT DETECTED.  The SARS-CoV-2 RNA is generally detectable in upper respiratory specimens during the acute phase of infection. The lowest concentration of SARS-CoV-2 viral copies this assay can detect  is 138 copies/mL. A negative result does not preclude SARS-Cov-2 infection and should not be used as the sole basis for treatment or    Influenza A by PCR NEGATIVE NEGATIVE Final   Influenza B by PCR NEGATIVE NEGATIVE Final    Comment: (NOTE) The Xpert Xpress SARS-CoV-2/FLU/RSV plus assay is intended a    Radiology Reports DG Ribs  Unilateral W/Chest Left  Result Date: 10/14/2021 CLINICAL DATA:  Left flank pain, left posterior chest pain EXAM: LEFT RIBS AND CHEST - 3+ VIEW COMPARISON:  CT 09/18/2021 FINDINGS: Small left pleural effusion has developed with wedge like opacity within the peripheral left lung base likely representing fluid within the fissure. The lungs are hyperinflated in keeping with changes of underlying COPD. No pneumothorax. Cardiac size within normal limits. Pulmonary vascularity is normal. No acute bone abnormality. Specifically, no rib fracture or destructive rib lesion identified. IMPRESSION: Interval development of small left pleural effusion. COPD Electronically Signed   By: Fidela Salisbury M.D.   On: 10/14/2021 00:15   CT Angio Chest PE W and/or Wo Contrast  Result Date: 10/14/2021 CLINICAL DATA:  PE suspected, high prob.  Hypoxia. EXAM: CT ANGIOGRAPHY CHEST WITH CONTRAST TECHNIQUE: Multidetector CT imaging of the chest was performed using the standard protocol during bolus administration of intravenous contrast. Multiplanar CT image reconstructions and MIPs were obtained to evaluate the vascular anatomy. CONTRAST:  19m O . IMPRESSION: Dense consolidation within the lingula compatible with acute lobar pneumonia in the appropriate clinical setting. Small associated left pleural effusion may represent a parapneumonic effusion. No pulmonary embolism. Extensive multi-vessel coronary artery calcification. Aortic Atherosclerosis (ICD10-I70.0). Electronically Signed   By: AFidela SalisburyM.D.   On: 10/14/2021 02:42    SIGNED: SDeatra James MD, FHM. [facility],   Pager (please use [email or web address] to page/text) Please use Epic Secure Chat for non-urgent communication (7AM-7PM)  If 7PM-7AM, please contact night-coverage [email or web address], 10/16/2021, 11:04 AM

## 2021-10-16 NOTE — Evaluation (Signed)
Occupational Therapy Evaluation Patient Details Name: Gabrielle Wood MRN: [id number] DOB: September 30, 1946 Today's Date: 10/16/2021   History of Present Illness Gabrielle Wood  is a 75 y.o. female, with history of COPD, diabetes mellitus type 2, DVT, H. pylori infection, high cholesterol, hypertension, hypothyroidism, NSTEMI, thyroid disease, and more presents the ED with a chief complaint of hypoglycemia.   Clinical Impression   Pt in bed upon therapy arrival and agreeable to participate in OT evaluation. Patient is sleepy and closing eyes frequently during evaluation due to lack of sleep during the night. She demonstrates slow bed mobility although has BUE WFL. She does require Minimal assistance with basic ADL tasks and functional transfers. She mentioned that her Sister is able to come home with her and assist temporarily when she is discharged. As she will have someone at home to assist I'm recommending Home Health OT at this time. OT will follow patient acutely.       Recommendations for follow up therapy are one component of a multi-disciplinary discharge planning process, led by the attending physician.  Recommendations may be updated based on patient status, additional functional criteria and insurance authorization.   Follow Up Recommendations  Home health OT    Assistance Recommended at Discharge Intermittent Supervision/Assistance  Functional Status Assessment  Patient has had a recent decline in their functional status and demonstrates the ability to make significant improvements in function in a reasonable and predictable amount of time.  Equipment Recommendations  Toilet rise with handles       Precautions / Restrictions Precautions Precautions: Fall Restrictions Weight Bearing Restrictions: No      Mobility Bed Mobility Overal bed mobility: Modified Independent             General bed mobility comments: Increased time to complete. Used bed railing.     Transfers Overall transfer level: Needs assistance Equipment used: Rolling walker (2 wheels) Transfers: Sit to/from Stand;Bed to chair/wheelchair/BSC Sit to Stand: Min assist     Step pivot transfers: Min assist     General transfer comment: utilized 2 wheeled walker with VC for technique for safety.      Balance Overall balance assessment: No apparent balance deficits (not formally assessed)       ADL either performed or assessed with clinical judgement   ADL Overall ADL's : Needs assistance/impaired Eating/Feeding: Set up;Sitting   Grooming: Wash/dry hands;Wash/dry face;Oral care;Brushing hair;Minimal assistance;Sitting   Upper Body Bathing: Minimal assistance;Sitting   Lower Body Bathing: Minimal assistance;Sitting/lateral leans;Sit to/from stand   Upper Body Dressing : Minimal assistance;Sitting   Lower Body Dressing: Minimal assistance;Sitting/lateral leans;Sit to/from stand   Toilet Transfer: Minimal assistance;Rolling walker (2 wheels)   Toileting- Clothing Manipulation and Hygiene: Minimal assistance;Sitting/lateral lean;Sit to/from stand       Functional mobility during ADLs: Minimal assistance;Rolling walker (2 wheels);Cueing for safety       Vision Baseline Vision/History: 0 No visual deficits Ability to See in Adequate Light: 0 Adequate Patient Visual Report: No change from baseline              Pertinent Vitals/Pain Pain Assessment: No/denies pain     Hand Dominance Right   Extremity/Trunk Assessment Upper Extremity Assessment Upper Extremity Assessment: Overall WFL for tasks assessed   Lower Extremity Assessment Lower Extremity Assessment: Defer to PT evaluation       Communication Communication Communication: No difficulties   Cognition Arousal/Alertness: Lethargic Behavior During Therapy: WFL for tasks assessed/performed Overall Cognitive Status: Within Functional Limits for tasks assessed  General Comments: Pt  sleepy during evaluation and reports she did not get any sleep last night.                Home Living Family/patient expects to be discharged to:: Private residence Living Arrangements: Children (Son is on disability. Unable to provide physical assistance.) Available Help at Discharge: Family;Available 24 hours/day (Pt reports that her Sister will take her home at discharge and will be able to stay with her and assist her for a short amount of time.) Type of Home: House Home Access: Stairs to enter Entrance Stairs-Number of Steps: 2 Entrance Stairs-Rails: Left;Right;Can reach both Home Layout: One level     Bathroom Shower/Tub: Teacher, early years/pre: Standard     Home Equipment: Conservation officer, nature (2 wheels);Shower seat;Cane - single point;Grab bars - tub/shower          Prior Functioning/Environment Prior Level of Function : Independent/Modified Independent             Mobility Comments: Pt repors that she used her walker for ambulation. She states she has felt weak for at least a week before coming to the hospital. She states she has not been able to get up and around for a week. ADLs Comments: Baseline is Modified Independent with ADL tasks.        OT Problem List: Decreased activity tolerance      OT Treatment/Interventions: Self-care/ADL training;Therapeutic activities;Energy conservation;DME and/or AE instruction;Balance training;Patient/family education    OT Goals(Current goals can be found in the care plan section) Acute Rehab OT Goals Patient Stated Goal: to go home OT Goal Formulation: With patient Time For Goal Achievement: 10/30/21 Potential to Achieve Goals: Good  OT Frequency: Min 3X/week   Barriers to D/C:    Pt reports that her Sister will be able to come home with her and stay with her temporarily at discharge.          AM-PAC OT "6 Clicks" Daily Activity     Outcome Measure Help from another person eating meals?: None Help from  another person taking care of personal grooming?: A Little Help from another person toileting, which includes using toliet, bedpan, or urinal?: A Lot Help from another person bathing (including washing, rinsing, drying)?: A Little Help from another person to put on and taking off regular upper body clothing?: A Little Help from another person to put on and taking off regular lower body clothing?: A Little 6 Click Score: 18   End of Session Equipment Utilized During Treatment: Gait belt;Rolling walker (2 wheels) Nurse Communication: Mobility status  Activity Tolerance: Patient tolerated treatment well Patient left: in bed;with call bell/phone within reach;with nursing/sitter in room  OT Visit Diagnosis: Muscle weakness (generalized) (M62.81)                Time: [id number] OT Time Calculation (min): 29 min Charges:  OT General Charges $OT Visit: 1 Visit OT Evaluation $OT Eval Low Complexity: 1 Low  Ailene Ravel, OTR/L,CBIS  [phone]   Tremane Spurgeon, Clarene Duke 10/16/2021, 9:36 AM

## 2021-10-16 NOTE — TOC Initial Note (Signed)
Transition of Care [facility]) - Initial/Assessment Note    Patient Details  Name: Gabrielle Wood MRN: [id number] Date of Birth: Jun 06, 1946  [facility] CM/SW Contact:    Karn Cassis, LCSW Phone Number: 10/16/2021, 2:12 PM  Clinical Narrative:   Pt admitted due to DKA and community-acquired pneumonia. Pt reports she has been living with her son but will go to her sister's [name]) home after d/c. Pt states she ambulates with walker at baseline. PT evaluated pt and recommend home health. Discussed with pt who is agreeable. Referred and accepted by Bonita Quin with [facility] for HHPT, OT, RN, and SW. Pt unsure if any DME needed. Requested that LCSW speak with [name]. Voicemail left for [place] requesting return call. TOC will continue to follow.                 Expected Discharge Plan: Home w Home Health Services Barriers to Discharge: Continued Medical Work up   Patient Goals and CMS Choice Patient states their goals for this hospitalization and ongoing recovery are:: plans to stay with sister after d/c   Choice offered to / list presented to : Patient  Expected Discharge Plan and Services Expected Discharge Plan: Home w Home Health Services In-house Referral: Clinical Social Work   Post Acute Care Choice: Home Health Living arrangements for the past 2 months: Single Family Home                 DME Arranged: N/A         HH Arranged: RN, PT, OT, Social Work [employer] Agency: [facility] Date HH Agency Contacted: 10/16/21 Time HH Agency Contacted: 1410 Representative spoke with at [facility] Agency: Bonita Quin  Prior Living Arrangements/Services Living arrangements for the past 2 months: Single Family Home Lives with:: Adult Children Patient language and need for interpreter reviewed:: Yes Do you feel safe going back to the place where you live?: Yes      Need for Family Participation in Patient Care: Yes (Comment) Care giver support system in place?: Yes  (comment) Current home services: DME (walker) Criminal Activity/Legal Involvement Pertinent to Current Situation/Hospitalization: No - Comment as needed  Activities of Daily Living Home Assistive Devices/Equipment: Walker (specify type) ADL Screening (condition at time of admission) Patient's cognitive ability adequate to safely complete daily activities?: No Is the patient deaf or have difficulty hearing?: No Does the patient have difficulty seeing, even when wearing glasses/contacts?: No Does the patient have difficulty concentrating, remembering, or making decisions?: Yes Patient able to express need for assistance with ADLs?: No Does the patient have difficulty dressing or bathing?: No Independently performs ADLs?: Yes (appropriate for developmental age) Does the patient have difficulty walking or climbing stairs?: No Weakness of Legs: None Weakness of Arms/Hands: None  Permission Sought/Granted Permission sought to share information with : Family Supports Permission granted to share information with : Yes, Verbal Permission Granted  Share Information with NAME: [name]     Permission granted to share info w Relationship: sister     Emotional Assessment   Attitude/Demeanor/Rapport: Engaged Affect (typically observed): Appropriate Orientation: : Oriented to Self, Oriented to Place, Oriented to Situation Alcohol / Substance Use: Not Applicable Psych Involvement: No (comment)  Admission diagnosis:  DKA (diabetic ketoacidosis) (HCC) [E11.10] Diabetic ketoacidosis without coma associated with type 2 diabetes mellitus (HCC) [E11.10] Left-sided chest pain [R07.9] Patient Active Problem List   Diagnosis Date Noted   Pressure injury of skin 10/16/2021   DKA (diabetic ketoacidosis) (HCC) 10/14/2021  Hypomagnesemia 10/14/2021   Hypoalbuminemia 10/14/2021   Community acquired pneumonia 10/14/2021   Renal cyst 10/02/2021   History of colonic polyps 08/27/2018   Taking multiple  medications for chronic disease 08/27/2018   Uncontrolled type 2 diabetes mellitus with circulatory disorder, with long-term current use of insulin 09/14/2015   Tobacco abuse counseling 09/14/2015   Gastritis and duodenitis 04/05/2012   Pancreatitis, acute 04/03/2012   Mixed hyperlipidemia 04/03/2012   Benign hypertension 04/03/2012   Hypothyroidism 04/03/2012   GERD (gastroesophageal reflux disease) 04/03/2012   PCP:  [name], MD Pharmacy:   [facility] - [place], [place] - [address] [place] [address] Phone: [phone] Fax: [phone]     Social Determinants of Health (SDOH) Interventions    Readmission Risk Interventions No flowsheet data found.

## 2021-10-16 NOTE — Plan of Care (Addendum)
  Problem: Acute Rehab PT Goals(only PT should resolve) Goal: Pt Will Go Supine/Side To Sit Outcome: Progressing Flowsheets (Taken 10/16/2021 1417) Pt will go Supine/Side to Sit: Independently Goal: Patient Will Transfer Sit To/From Stand Outcome: Progressing Flowsheets (Taken 10/16/2021 1417) Patient will transfer sit to/from stand: with min guard assist Goal: Pt Will Transfer Bed To Chair/Chair To Bed Outcome: Progressing Flowsheets (Taken 10/16/2021 1417) Pt will Transfer Bed to Chair/Chair to Bed: with modified independence Goal: Pt Will Ambulate Outcome: Progressing Flowsheets (Taken 10/16/2021 1418) Pt will Ambulate:  25 feet  with modified independence  with rolling walker   Cassie Jones, SPT  During this treatment session, the therapist was present, participating in and directing the treatment.  3:28 PM, 10/16/21 Ocie Bob, MPT Physical Therapist with [facility] [phone] office [phone] mobile phone

## 2021-10-16 NOTE — Evaluation (Addendum)
Physical Therapy Evaluation Patient Details Name: Gabrielle Wood MRN: [id number] DOB: [birth year] Today's Date: 10/16/2021  History of Present Illness  Gabrielle Wood  is a 75 y.o. female, with history of COPD, diabetes mellitus type 2, DVT, H. pylori infection, high cholesterol, hypertension, hypothyroidism, NSTEMI, thyroid disease, and more presents the ED with a chief complaint of hypoglycemia.  Unfortunately patient is not able to provide much history given that she is quite sedated after having fentanyl.  Patient reports that she is here because of hyperglycemia.  She reports sugar at home was over 600.  She did take an extra dose of insulin, but she does not know which kind-more likely the short acting and she reports that her sugar did not improve.  On chart review there reported that her sugar improved and then started going back up.  Patient reports that she has felt weak, but does not know when it started.  She has not had any fevers, but she has had a productive cough with yellow sputum.  She takes [place] and she reports compliance with this medication.  She reports polydipsia and polyuria, but no polyphagia, and she has been nauseous.  Patient did present to [facility] yesterday, they gave her some fluids and discharged her home.  When she got home she was very weak and relative tried to lift her from the car.  Since then she has had some left-sided rib pain.  She reports that it hurts to take a deep breath, it hurts to palpate the area, and it hurts at rest.  She reports that the pain medicine helped, but did not relieve the pain.  Patient has no other complaints at this time.   Clinical Impression  Patient presented in bed lethargic, agreeable for therapy once awake. Patient required increased time transition from supine w/ HOB flat to sitting at EOB d/t generalized weakness and lethargy. Patient performed a few attempts for a sit to stand transfer using a RW, required Min A to stand d/t  generalized weakness. Once standing, patient performed a step pivot transfer using a RW w/ good weight shifts and steadiness on feet. Patient ambulated in hallway using a RW, demonstrated decreased step length, very slow cadence, and  steadiness on feet w/ no near loss of balance d/t lethargy and generalized weakness. Patient was encouraged to ambulate in her room w/ nursing staff to build strength. Patient tolerated sitting up in chair after therapy. Patient will benefit from continued skilled physical therapy in hospital and recommended venue below to increase strength, balance, endurance for safe ADLs and gait.      Recommendations for follow up therapy are one component of a multi-disciplinary discharge planning process, led by the attending physician.  Recommendations may be updated based on patient status, additional functional criteria and insurance authorization.  Follow Up Recommendations Home health PT    Assistance Recommended at Discharge Set up Supervision/Assistance  Functional Status Assessment Patient has had a recent decline in their functional status and demonstrates the ability to make significant improvements in function in a reasonable and predictable amount of time.  Equipment Recommendations  None recommended by PT    Recommendations for Other Services       Precautions / Restrictions Precautions Precautions: Fall Restrictions Weight Bearing Restrictions: No      Mobility  Bed Mobility Overal bed mobility: Modified Independent             General bed mobility comments: Increased time to complete. Used bed railing.  Transfers Overall transfer level: Needs assistance Equipment used: Rolling walker (2 wheels) Transfers: Sit to/from Stand;Bed to chair/wheelchair/BSC Sit to Stand: Min assist   Step pivot transfers: Supervision       General transfer comment: Performed a few attempts for a sit to stand transfer using a RW, required Min A to stand and  Min guard/supervision to transfer from bed to chair using RW.    Ambulation/Gait Ambulation/Gait assistance: Supervision Gait Distance (Feet): 25 Feet Assistive device: Rolling walker (2 wheels) Gait Pattern/deviations: Decreased step length - right;Decreased step length - left;Decreased stride length;Step-to pattern Gait velocity: decreased     General Gait Details: Demonstrated an occasional step-to pattern with L LE, very slow cadence, and steady on feet w/ no near loss of balance.  Stairs            Wheelchair Mobility    Modified Rankin (Stroke Patients Only)       Balance Overall balance assessment: Needs assistance Sitting-balance support: Feet supported;No upper extremity supported Sitting balance-Leahy Scale: Good Sitting balance - Comments: fair/good seated at EOB   Standing balance support: Bilateral upper extremity supported;During functional activity Standing balance-Leahy Scale: Good Standing balance comment: good using RW                             Pertinent Vitals/Pain Pain Assessment: No/denies pain    Home Living Family/patient expects to be discharged to:: Private residence Living Arrangements: Children Available Help at Discharge: Family;Available 24 hours/day Type of Home: House Home Access: Stairs to enter Entrance Stairs-Rails: Left;Right;Can reach both Entrance Stairs-Number of Steps: 2   Home Layout: One level Home Equipment: Agricultural consultant (2 wheels);Shower seat;Cane - single point;Grab bars - tub/shower      Prior Function Prior Level of Function : Independent/Modified Independent;Driving             Mobility Comments: Patient reports using rollator for ambulation inside the house and using a cane outside of the house. ADLs Comments: Baseline is Modified Independent with ADL tasks per patient.     Hand Dominance        Extremity/Trunk Assessment   Upper Extremity Assessment Upper Extremity Assessment:  Overall WFL for tasks assessed    Lower Extremity Assessment Lower Extremity Assessment: Generalized weakness       Communication   Communication: No difficulties  Cognition Arousal/Alertness: Lethargic Behavior During Therapy: WFL for tasks assessed/performed Overall Cognitive Status: Within Functional Limits for tasks assessed                                 General Comments: Patient lethargic during evaluation, reports getting little sleep last night.        General Comments      Exercises     Assessment/Plan    PT Assessment Patient needs continued PT services  PT Problem List Decreased strength;Decreased mobility;Decreased range of motion;Decreased safety awareness;Decreased activity tolerance;Decreased balance;Decreased knowledge of use of DME;Decreased coordination       PT Treatment Interventions DME instruction;Therapeutic exercise;Gait training;Balance training;Stair training;Functional mobility training;Therapeutic activities;Patient/family education    PT Goals (Current goals can be found in the Care Plan section)  Acute Rehab PT Goals Patient Stated Goal: Return home. PT Goal Formulation: With patient Time For Goal Achievement: 10/19/21 Potential to Achieve Goals: Good    Frequency Min 3X/week   Barriers to discharge  Co-evaluation               AM-PAC PT "6 Clicks" Mobility  Outcome Measure Help needed turning from your back to your side while in a flat bed without using bedrails?: None Help needed moving from lying on your back to sitting on the side of a flat bed without using bedrails?: None Help needed moving to and from a bed to a chair (including a wheelchair)?: A Little Help needed standing up from a chair using your arms (e.g., wheelchair or bedside chair)?: A Little Help needed to walk in hospital room?: A Little Help needed climbing 3-5 steps with a railing? : A Lot 6 Click Score: 19    End of Session    Activity Tolerance: Patient tolerated treatment well;Patient limited by fatigue Patient left: in chair;with call bell/phone within reach Nurse Communication: Mobility status PT Visit Diagnosis: Unsteadiness on feet (R26.81);Other abnormalities of gait and mobility (R26.89);Muscle weakness (generalized) (M62.81)    Time: 1110-1130 PT Time Calculation (min) (ACUTE ONLY): 20 min   Charges:   PT Evaluation $PT Eval Moderate Complexity: 1 Mod PT Treatments $Therapeutic Activity: 8-22 mins        Cassie Jones, SPT  During this treatment session, the therapist was present, participating in and directing the treatment.  3:20 PM, 10/16/21 Ocie Bob, MPT Physical Therapist with [facility] [phone] office [phone] mobile phone

## 2021-10-17 DIAGNOSIS — I10 Essential (primary) hypertension: Secondary | ICD-10-CM | POA: Diagnosis not present

## 2021-10-17 DIAGNOSIS — J189 Pneumonia, unspecified organism: Secondary | ICD-10-CM | POA: Diagnosis not present

## 2021-10-17 DIAGNOSIS — L89300 Pressure ulcer of unspecified buttock, unstageable: Secondary | ICD-10-CM | POA: Diagnosis not present

## 2021-10-17 DIAGNOSIS — E1110 Type 2 diabetes mellitus with ketoacidosis without coma: Secondary | ICD-10-CM | POA: Diagnosis not present

## 2021-10-17 LAB — COMPREHENSIVE METABOLIC PANEL
ALT: 12 U/L (ref 0–44)
AST: 15 U/L (ref 15–41)
Albumin: 2.7 g/dL — ABNORMAL LOW (ref 3.5–5.0)
Alkaline Phosphatase: 55 U/L (ref 38–126)
Anion gap: 6 (ref 5–15)
BUN: 13 mg/dL (ref 8–23)
CO2: 22 mmol/L (ref 22–32)
Calcium: 8.7 mg/dL — ABNORMAL LOW (ref 8.9–10.3)
Chloride: 103 mmol/L (ref 98–111)
Creatinine, Ser: 0.54 mg/dL (ref 0.44–1.00)
GFR, Estimated: >60 mL/min (ref >60)
Glucose, Bld: 260 mg/dL — ABNORMAL HIGH (ref 70–99)
Potassium: 3.9 mmol/L (ref 3.5–5.1)
Sodium: 131 mmol/L — ABNORMAL LOW (ref 135–145)
Total Bilirubin: 0.5 mg/dL (ref 0.3–1.2)
Total Protein: 5.7 g/dL — ABNORMAL LOW (ref 6.5–8.1)

## 2021-10-17 LAB — GLUCOSE, CAPILLARY
Glucose-Capillary: 179 mg/dL — ABNORMAL HIGH (ref 70–99)
Glucose-Capillary: 248 mg/dL — ABNORMAL HIGH (ref 70–99)

## 2021-10-17 MED ORDER — LEVOFLOXACIN 750 MG PO TABS: 750.0000 mg | ORAL_TABLET | Freq: Every day | ORAL | 0 refills | Status: AC

## 2021-10-17 MED ORDER — METOPROLOL SUCCINATE ER 50 MG PO TB24: 50.0000 mg | ORAL_TABLET | Freq: Every evening | ORAL | 1 refills | Status: DC

## 2021-10-17 NOTE — Care Management Important Message (Signed)
Important Message  Patient Details  Name: Gabrielle Wood MRN: [id number] Date of Birth: 1946-05-12   Medicare Important Message Given:  Yes     Corey Harold 10/17/2021, 10:10 AM

## 2021-10-17 NOTE — Progress Notes (Signed)
Pt taken to main entrance via wheelchair by staff to be transported home by sister.

## 2021-10-17 NOTE — Discharge Summary (Signed)
Physician Discharge Summary [facility]    Patient: Gabrielle Wood                   Admit date: 10/13/2021   DOB: December 11, 1945             Discharge date:10/17/2021/9:27 AM ZES:[id number]                          PCP: [place], MD  Disposition: HOME with Home Health   Recommendations for Outpatient Follow-up:   Follow up: with PCP in 1 week  And current medication, but a course of antibiotics Recommending strict diabetic diet, checking blood sugars at least 3 times a day before meals Diabetic medication subjective change PCP for better glycemic control.  Discharge Condition: Stable   Code Status:   Code Status: Full Code  Diet recommendation: Diabetic diet   Discharge Diagnoses:    Active Problems:   Benign hypertension   Hypothyroidism   GERD (gastroesophageal reflux disease)   DKA (diabetic ketoacidosis) (HCC)   Hypomagnesemia   Hypoalbuminemia   Community acquired pneumonia   Pressure injury of skin   History of Present Illness/ Hospital Course Gabrielle Wood Summary:    Gabrielle Wood  is a 75 y.o. female, with history of COPD, diabetes mellitus type 2, DVT, H. pylori infection, high cholesterol, hypertension, hypothyroidism, NSTEMI, thyroid disease ...  presents the ED with a chief complaint of hyperglycemia.   Patient reports that she is here because of hyperglycemia.  She reports sugar at home was over 600.    ED: Temp 97.6, HR 75-93, RR 20-29, BP 133/68, satting at 97% Patient has a leukocytosis of 18.1 hemoglobin 13.4 Chemistry panel reveals a decreased bicarb at 10, pseudohyponatremia, and glucose of 482 with a gap of 19 VBG shows a pH of 7.2, PCO2 26 Patient did have CTA of her chest which showed no pulmonary embolism but did show an acute lobar pneumonia Beta hydroxybutyrate is elevated - T bili is elevated Patient was given 10 units of NovoLog, started on insulin drip, started on Rocephin and Zithromax, given 2.152 L of fluid, and Endo tool  initiated Admitted for DKA and pneumonia .      Diabetic ketoacidosis -stable uncontrolled diabetes mellitus type 2 -Diabetic ketoacidosis physiology resolved--- stable, tolerating p.o. -Advancing diet as tolerated, continuing SSI coverage, CBG q. ACH S -Initiated Levemir 20 units nightly... Initiating home regimen insulin Medic diet   -On admission:  Decreased bicarb at 10, pseudohyponatremia, and glucose of 482 with a gap of 19, VBG shows a pH of 7.2, PCO2 26 -Status post treatment for DKA with protocol IV fluid resuscitation, insulin drip   -Hyperglycemia, DKA complicated as result of pneumonia -Reinitiating home regimen of Tresiba, resuming metformin      Community-acquired pneumonia -SIRS physiology improved, currently afebrile, normotensive   -On admission: Met SIRS criteria: afebrile, mildly tachypneic, tachycardic, WBC 18.1, satting 97% on room air, with metabolic acidosis - IV fluid resuscitation, broad-spectrum antibiotics Rocephin, azithromycin >>> switching to p.o. Levaquin -Blood and sputum cultures >>> no growth to date       Brief episode of supraventricular tachycardia  --Episode on this admission, resolved -He was placed on Cardizem drip overnight on 11/13/ 22, normal sinus rhythm now -Patient's Toprol-XL was increased to 50 mg daily -stable now      hypomagnesemia/hypokalemia/hypophosphatemia -Continue monitoring, potassium magnesium and phosphorus were repleted     Hypothyroidism -We will continue Synthroid -Stable  Hypoalbuminemia -Monitoring repleted, encouraging p.o. intake Nutrition supplements   Hypertension -Remained stable, continue home medication losartan, amlodipine   H/o coronary artery disease with stent In January 2022 -Currently stable continue home medication: Aspirin, Brilinta, statins, metoprolol,   GERD -Continue PPI,    Transferring out of ICU /stepdown10/13/2022    ------------------------------------------------------------------------------------------------------------------------------ Cultures; Blood Cultures x 2 >> NGT Sputum Culture >> NGT    Antimicrobials: Antibiotics IV Rocephin, azithromycin >>> switch to p.o. Levaquin for total of 7 days of antibiotic coverage    Code Status:   Code Status: Full Code  Admission status:   tatus is: Inpatient    Disposition: From home                     Anticipating discharging home with home health         SKIN assessment: I agree with skin assessment and plan as outlined below: Pressure Injury 10/15/21 Sacrum Medial Stage 2 -  Partial thickness loss of dermis presenting as a shallow open injury with a red, pink wound bed without slough. 1 x 2cm area with top layer of skin missing, surrounding area red (Active)  10/15/21 1414  Location: Sacrum  Location Orientation: Medial  Staging: Stage 2 -  Partial thickness loss of dermis presenting as a shallow open injury with a red, pink wound bed without slough.  Wound Description (Comments): 1 x 2cm area with top layer of skin missing, surrounding area red  Present on Admission: Yes    Risk of unplanned readmission Score: Moderate   Discharge Instructions:   Discharge Instructions     Activity as tolerated - No restrictions   Complete by: As directed    Call MD for:  difficulty breathing, headache or visual disturbances   Complete by: As directed    Call MD for:  persistant dizziness or light-headedness   Complete by: As directed    Call MD for:  persistant nausea and vomiting   Complete by: As directed    Call MD for:  redness, tenderness, or signs of infection (pain, swelling, redness, odor or green/yellow discharge around incision site)   Complete by: As directed    Call MD for:  temperature >100.4   Complete by: As directed    Diet - low sodium heart healthy   Complete by: As directed    Diet Carb Modified   Complete by: As  directed    Discharge instructions   Complete by: As directed    F/up with PCP in 1 wk   Discharge wound care:   Complete by: As directed    As directed bu wound RN   Increase activity slowly   Complete by: As directed         Medication List     STOP taking these medications    baclofen 10 MG tablet Commonly known as: LIORESAL   IBU 800 MG tablet Generic drug: ibuprofen       TAKE these medications    ALPRAZolam 1 MG tablet Commonly known as: XANAX Take 0.5-1 mg by mouth. 0.5 tablet in am and 1 tab at night   amLODipine 5 MG tablet Commonly known as: NORVASC Take 5 mg by mouth daily.   aspirin EC 81 MG tablet Take 81 mg by mouth daily. Swallow whole.   atorvastatin 80 MG tablet Commonly known as: LIPITOR Take 80 mg by mouth at bedtime.   gabapentin 300 MG capsule Commonly known as: NEURONTIN Take 300 mg by mouth  3 (three) times daily.   insulin lispro 100 UNIT/ML KwikPen Commonly known as: HUMALOG Inject 5-8 Units into the skin 3 (three) times daily.   levocetirizine 5 MG tablet Commonly known as: XYZAL Take 1 tablet (5 mg total) by mouth every evening.   levofloxacin 750 MG tablet Commonly known as: Levaquin Take 1 tablet (750 mg total) by mouth daily for 4 days.   levothyroxine 125 MCG tablet Commonly known as: SYNTHROID Take 1 tablet (125 mcg total) by mouth daily.   loperamide 2 MG capsule Commonly known as: IMODIUM Take 2 mg by mouth 4 (four) times daily as needed.   metFORMIN 500 MG tablet Commonly known as: GLUCOPHAGE Take 2 tablets (1,000 mg total) by mouth 2 (two) times daily with a meal. 2 tabs bid   metoprolol succinate 50 MG 24 hr tablet Commonly known as: TOPROL-XL Take 1 tablet (50 mg total) by mouth every evening. Take with or immediately following a meal. What changed:  medication strength how much to take additional instructions   nitroGLYCERIN 0.4 MG SL tablet Commonly known as: NITROSTAT Place 0.4 mg under the  tongue every 5 (five) minutes as needed for chest pain.   ondansetron 4 MG disintegrating tablet Commonly known as: ZOFRAN-ODT Take 4 mg by mouth every 8 (eight) hours as needed.   pantoprazole 40 MG tablet Commonly known as: PROTONIX Take 40 mg by mouth daily.   ticagrelor 90 MG Tabs tablet Commonly known as: BRILINTA Take 90 mg by mouth 2 (two) times daily.   Tyler Aas FlexTouch 100 UNIT/ML FlexTouch Pen Generic drug: insulin degludec Inject 20 Units into the skin at bedtime.   valsartan 80 MG tablet Commonly known as: DIOVAN Take 80 mg by mouth daily.        Follow-up Information     [facility].   Specialty: Home Health Services Why: Will contact you to schedule home health visits.               Allergies  Allergen Reactions   Ciprocin-Fluocin-Procin [Fluocinolone]     Nightmares   Lipitor [Atorvastatin] Other (See Comments)    Muscle aches, pain   Lisinopril Cough   Red Yeast Rice [Cholestin] Itching and Rash     Quit smoking:  It is highly recommended that all people especially deals with diabetes to quit smoking or stay away from smoking, avoid secondhand smoking.   Vaccines:  Also highly recommended update your vaccines requirement, including SARS-CoV-2 , yearly  flu vaccine and pneumonia vaccine at least every 5 years.      Exercise: If you are able: 30 -60 minutes a day, 4 days a week, or 150 minutes a week.  The longer the better.  Combine stretch, strength, and aerobic activities.  If you were told in the past that you have high risk for cardiovascular diseases, you may seek evaluation by your heart doctor prior to initiating moderate to intense exercise programs.    One other important lifestyle recommendation is to ensure adequate sleep - at least 6-7 hours of uninterrupted sleep at night.  Procedures /Studies:   DG Ribs Unilateral W/Chest Left  Result Date: 10/14/2021 CLINICAL DATA:  Left flank pain, left  posterior chest pain EXAM: LEFT RIBS AND CHEST - 3+ VIEW COMPARISON:  CT 09/18/2021 FINDINGS: Small left pleural effusion has developed with wedge like opacity within the peripheral left lung base likely representing fluid within the fissure. The lungs are hyperinflated in keeping with changes of underlying  COPD. No pneumothorax. Cardiac size within normal limits. Pulmonary vascularity is normal. No acute bone abnormality. Specifically, no rib fracture or destructive rib lesion identified. IMPRESSION: Interval development of small left pleural effusion. COPD Electronically Signed   By: Fidela Salisbury M.D.   On: 10/14/2021 00:15   CT Angio Chest PE W and/or Wo Contrast  Result Date: 10/14/2021 CLINICAL DATA:  PE suspected, high prob.  Hypoxia. EXAM: CT ANGIOGRAPHY CHEST WITH CONTRAST TECHNIQUE: Multidetector CT imaging of the chest was performed using the standard protocol during bolus administration of intravenous contrast. Multiplanar CT image reconstructions and MIPs were obtained to evaluate the vascular anatomy. CONTRAST:  125m OMNIPAQUE IOHEXOL 350 MG/ML SOLN COMPARISON:  None. FINDINGS: Cardiovascular: Adequate opacification of the pulmonary arterial tree. No intraluminal filling defect identified to suggest acute pulmonary embolism. The central pulmonary arteries are of normal caliber. Extensive multi-vessel coronary artery calcification. Global cardiac size within normal limits. No pericardial effusion. Mild atherosclerotic calcification within the thoracic aorta. No aortic aneurysm. Mediastinum/Nodes: Shotty aortopulmonary window lymphadenopathy may be reactive in nature. No frankly pathologic thoracic adenopathy identified. Esophagus is unremarkable. Lungs/Pleura: There is dense consolidation within the lingula, in keeping with acute lobar pneumonia in the appropriate clinical setting. Scattered additional infiltrate noted within the left upper lobe. Small left pleural effusion is present, possibly  representing a small parapneumonic effusion. Mild associated left lower lobe compressive atelectasis. Right lung is clear. No pneumothorax. No pleural effusion on the right. There is airway impaction segmentally within the left lower lobe centrally. Upper Abdomen: No acute abnormality. Musculoskeletal: No acute bone abnormality. No lytic or blastic bone lesion. Osseous structures are age-appropriate. Review of the MIP images confirms the above findings. IMPRESSION: Dense consolidation within the lingula compatible with acute lobar pneumonia in the appropriate clinical setting. Small associated left pleural effusion may represent a parapneumonic effusion. No pulmonary embolism. Extensive multi-vessel coronary artery calcification. Aortic Atherosclerosis (ICD10-I70.0). Electronically Signed   By: AFidela SalisburyM.D.   On: 10/14/2021 02:42    Subjective:   Patient was seen and examined 10/17/2021, 9:27 AM Patient stable today. No acute distress.  No issues overnight Stable for discharge.  Discharge Exam:    Vitals:   10/16/21 0541 10/16/21 1355 10/16/21 2059 10/17/21 0528  BP: 138/82 126/74 132/74 139/75  Pulse: 73 79 77 75  Resp: 17 17 17 17   Temp: 97.9 F (36.6 C) 98.1 F (36.7 C) 97.9 F (36.6 C) 98.3 F (36.8 C)  TempSrc: Oral  Oral Oral  SpO2: 96% 97% 95% 96%  Weight:      Height:        General: Pt lying comfortably in bed & appears in no obvious distress. Cardiovascular: S1 & S2 heard, RRR, S1/S2 +. No murmurs, rubs, gallops or clicks. No JVD or pedal edema. Respiratory: Clear to auscultation without wheezing, rhonchi or crackles. No increased work of breathing. Abdominal:  Non-distended, non-tender & soft. No organomegaly or masses appreciated. Normal bowel sounds heard. CNS: Alert and oriented. No focal deficits. Extremities: no edema, no cyanosis Pressure Injury 10/15/21 Sacrum Medial Stage 2 -  Partial thickness loss of dermis presenting as a shallow open injury with a red,  pink wound bed without slough. 1 x 2cm area with top layer of skin missing, surrounding area red (Active)  10/15/21 1414  Location: Sacrum  Location Orientation: Medial  Staging: Stage 2 -  Partial thickness loss of dermis presenting as a shallow open injury with a red, pink wound bed without slough.  Wound  Description (Comments): 1 x 2cm area with top layer of skin missing, surrounding area red  Present on Admission: Yes      The results of significant diagnostics from this hospitalization (including imaging, microbiology, ancillary and laboratory) are listed below for reference.      Microbiology:   Recent Results (from the past 240 hour(s))  Resp Panel by RT-PCR (Flu A&B, Covid) Nasopharyngeal Swab     Status: None   Collection Time: 10/14/21  2:04 AM   Specimen: Nasopharyngeal Swab; Nasopharyngeal(NP) swabs in vial transport medium  Result Value Ref Range Status   SARS Coronavirus 2 by RT PCR NEGATIVE NEGATIVE Final    Comment: (NOTE) SARS-CoV-2 target nucleic acids are NOT DETECTED.  The SARS-CoV-2 RNA is generally detectable in upper respiratory specimens during the acute phase of infection. The lowest concentration of SARS-CoV-2 viral copies this assay can detect is 138 copies/mL. A negative result does not preclude SARS-Cov-2 infection and should not be used as the sole basis for treatment or other patient management decisions. A negative result may occur with  improper specimen collection/handling, submission of specimen other than nasopharyngeal swab, presence of viral mutation(s) within the areas targeted by this assay, and inadequate number of viral copies(<138 copies/mL). A negative result must be combined with clinical observations, patient history, and epidemiological information. The expected result is Negative.  Fact Sheet for Patients:  EntrepreneurPulse.com.au  Fact Sheet for Healthcare Providers:   IncredibleEmployment.be  This test is no t yet approved or cleared by the [place] FDA and  has been authorized for detection and/or diagnosis of SARS-CoV-2 by FDA under an Emergency Use Authorization (EUA). This EUA will remain  in effect (meaning this test can be used) for the duration of the COVID-19 declaration under Section 564(b)(1) of the Act, 21 U.S.C.section 360bbb-3(b)(1), unless the authorization is terminated  or revoked sooner.       Influenza A by PCR NEGATIVE NEGATIVE Final   Influenza B by PCR NEGATIVE NEGATIVE Final    Comment: (NOTE) The Xpert Xpress SARS-CoV-2/FLU/RSV plus assay is intended as an aid in the diagnosis of influenza from Nasopharyngeal swab specimens and should not be used as a sole basis for treatment. Nasal washings and aspirates are unacceptable for Xpert Xpress SARS-CoV-2/FLU/RSV testing.  Fact Sheet for Patients: EntrepreneurPulse.com.au  Fact Sheet for Healthcare Providers: IncredibleEmployment.be  This test is not yet approved or cleared by the [place] FDA and has been authorized for detection and/or diagnosis of SARS-CoV-2 by FDA under an Emergency Use Authorization (EUA). This EUA will remain in effect (meaning this test can be used) for the duration of the COVID-19 declaration under Section 564(b)(1) of the Act, 21 U.S.C. section 360bbb-3(b)(1), unless the authorization is terminated or revoked.  Performed at [facility], [address], [place], [place] [address]      Labs:   CBC: Recent Labs  Lab 10/13/21 2327 10/15/21 0532  WBC 18.1* 12.1*  HGB 13.4 14.6  HCT 40.0 42.0  MCV 97.8 92.1  PLT 119* 315*   Basic Metabolic Panel: Recent Labs  Lab 10/13/21 2343 10/14/21 0552 10/14/21 0924 10/14/21 1214 10/14/21 1515 10/14/21 2050 10/15/21 0532  NA 130*   < > 133* 131* 131* 131* 130*  K 3.7   < > 3.3* 3.7 3.7 3.5 3.1*  CL 101   < > 107 106 105 104 104   CO2 10*   < > 18* 21* 17* 18* 19*  GLUCOSE 482*   < > 173* 189*  164* 138* 106*  BUN 21   < > 21 21 22 19 12   CREATININE 0.94   < > 0.72 0.79 0.72 0.61 0.47  CALCIUM 9.2   < > 9.1 9.0 9.1 8.6* 8.4*  MG 1.5*  --   --   --   --   --  1.6*  PHOS  --   --   --   --   --   --  1.2*   < > = values in this interval not displayed.   Liver Function Tests: Recent Labs  Lab 10/13/21 2343  AST 15  ALT 13  ALKPHOS 51  BILITOT 1.6*  PROT 6.2*  ALBUMIN 3.4*   BNP (last 3 results) No results for input(s): BNP in the last 8760 hours. Cardiac Enzymes: No results for input(s): CKTOTAL, CKMB, CKMBINDEX, TROPONINI in the last 168 hours. CBG: Recent Labs  Lab 10/16/21 0738 10/16/21 1134 10/16/21 1631 10/16/21 2101 10/17/21 0727  GLUCAP 170* 303* 226* 302* 179*   Hgb A1c No results for input(s): HGBA1C in the last 72 hours. Lipid Profile No results for input(s): CHOL, HDL, LDLCALC, TRIG, CHOLHDL, LDLDIRECT in the last 72 hours. Thyroid function studies No results for input(s): TSH, T4TOTAL, T3FREE, THYROIDAB in the last 72 hours.  Invalid input(s): FREET3 Anemia work up No results for input(s): VITAMINB12, FOLATE, FERRITIN, TIBC, IRON, RETICCTPCT in the last 72 hours. Urinalysis    Component Value Date/Time   COLORURINE YELLOW 10/14/2021 1308   APPEARANCEUR CLEAR 10/14/2021 1308   APPEARANCEUR Clear 10/02/2021 1400   LABSPEC >1.046 (H) 10/14/2021 1308   PHURINE 5.0 10/14/2021 1308   GLUCOSEU >=500 (A) 10/14/2021 1308   HGBUR SMALL (A) 10/14/2021 1308   BILIRUBINUR NEGATIVE 10/14/2021 1308   BILIRUBINUR Negative 10/02/2021 1400   KETONESUR 80 (A) 10/14/2021 1308   PROTEINUR NEGATIVE 10/14/2021 1308   UROBILINOGEN 0.2 04/03/2012 2144   NITRITE NEGATIVE 10/14/2021 1308   LEUKOCYTESUR NEGATIVE 10/14/2021 1308   Pressure Injury 10/15/21 Sacrum Medial Stage 2 -  Partial thickness loss of dermis presenting as a shallow open injury with a red, pink wound bed without slough. 1 x 2cm  area with top layer of skin missing, surrounding area red (Active)  10/15/21 1414  Location: Sacrum  Location Orientation: Medial  Staging: Stage 2 -  Partial thickness loss of dermis presenting as a shallow open injury with a red, pink wound bed without slough.  Wound Description (Comments): 1 x 2cm area with top layer of skin missing, surrounding area red  Present on Admission: Yes       Time coordinating discharge: Over 45 minutes  SIGNED: Deatra James, MD, FACP, [facility]. [facility],  Please use [email or web address] to Page If 7PM-7AM, please contact night-coverage [email or web address],  10/17/2021, 9:27 AM

## 2021-10-17 NOTE — Progress Notes (Signed)
Physical Therapy Treatment Patient Details Name: Gabrielle Wood MRN: [id number] DOB: [birth year] Today's Date: 10/17/2021   History of Present Illness Gabrielle Wood is a 75 y.o. female presents with a chief complaint of hypoglycemia. CTA chest no PE and (+) acute lobar pneumonia. PMH: COPD, diabetes mellitus type 2, DVT, H. pylori infection, high cholesterol, hypertension, hypothyroidism, NSTEMI, thyroid disease    PT Comments    Pt tolerates 70 ft with RW, step to pattern, very slow cadence with increased time when turning. Pt able to power to stand with BUE assisting, rocking momentum from EOB and use of rail in bathroom when powering up from toilet with 2 attempts. Pt requires min A with pericare, able to maintain static standing with RW while therapist assists. Pt tolerates remaining up in chair at EOS, hopeful to d/c home with 24/7 family support.   Recommendations for follow up therapy are one component of a multi-disciplinary discharge planning process, led by the attending physician.  Recommendations may be updated based on patient status, additional functional criteria and insurance authorization.  Follow Up Recommendations  Home health PT     Assistance Recommended at Discharge Set up Supervision/Assistance  Equipment Recommendations  None recommended by PT    Recommendations for Other Services       Precautions / Restrictions Precautions Precautions: Fall Restrictions Weight Bearing Restrictions: No     Mobility  Bed Mobility Overal bed mobility: Modified Independent  General bed mobility comments: increased time    Transfers Overall transfer level: Needs assistance Equipment used: Rolling walker (2 wheels) Transfers: Sit to/from Stand Sit to Stand: Min guard  General transfer comment: min guard to power to stand, rocking momentum from EOB and use of rail when rising from toilet    Ambulation/Gait Ambulation/Gait assistance: Supervision Gait Distance  (Feet): 70 Feet Assistive device: Rolling walker (2 wheels) Gait Pattern/deviations: Step-to pattern;Decreased stride length Gait velocity: decreased     General Gait Details: very slow, step to pattern with RW, increased steps and time to turn, good steadiness and no LOB   Stairs             Wheelchair Mobility    Modified Rankin (Stroke Patients Only)       Balance Overall balance assessment: Mild deficits observed, not formally tested     Cognition Arousal/Alertness: Awake/alert Behavior During Therapy: WFL for tasks assessed/performed Overall Cognitive Status: Within Functional Limits for tasks assessed     Exercises      General Comments        Pertinent Vitals/Pain Pain Assessment: Faces Faces Pain Scale: Hurts a little bit Pain Location: mid back with combing hair Pain Descriptors / Indicators: Sore ("I think I pulled a muscle rolling over in bed") Pain Intervention(s): Limited activity within patient's tolerance;Monitored during session    Home Living                          Prior Function            PT Goals (current goals can now be found in the care plan section) Acute Rehab PT Goals Patient Stated Goal: Return home. PT Goal Formulation: With patient Time For Goal Achievement: 10/19/21 Potential to Achieve Goals: Good Progress towards PT goals: Progressing toward goals    Frequency    Min 3X/week      PT Plan Current plan remains appropriate    Co-evaluation  AM-PAC PT "6 Clicks" Mobility   Outcome Measure  Help needed turning from your back to your side while in a flat bed without using bedrails?: None Help needed moving from lying on your back to sitting on the side of a flat bed without using bedrails?: None Help needed moving to and from a bed to a chair (including a wheelchair)?: A Little Help needed standing up from a chair using your arms (e.g., wheelchair or bedside chair)?: A Little Help  needed to walk in hospital room?: A Little Help needed climbing 3-5 steps with a railing? : A Lot 6 Click Score: 19    End of Session   Activity Tolerance: Patient tolerated treatment well Patient left: in chair;with call bell/phone within reach Nurse Communication: Mobility status PT Visit Diagnosis: Unsteadiness on feet (R26.81);Other abnormalities of gait and mobility (R26.89);Muscle weakness (generalized) (M62.81)     Time: [id number] PT Time Calculation (min) (ACUTE ONLY): 25 min  Charges:  $Gait Training: 8-22 mins $Therapeutic Activity: 8-22 mins                      Tori Trevonne Nyland PT, DPT 10/17/21, 10:51 AM

## 2021-10-17 NOTE — TOC Transition Note (Signed)
Transition of Care [facility]) - CM/SW Discharge Note   Patient Details  Name: Gabrielle Wood MRN: [id number] Date of Birth: [birth year]  Transition of Care [facility]) CM/SW Contact:  Shade Flood, LCSW Phone Number: 10/17/2021, 11:34 AM   Clinical Narrative:     Pt stable for dc home with [facility] today per MD. Met with pt and her sister, Marlowe Kays, at bedside today to review dc plan. Plan remains for pt to stay with her other sister, Onalee Hua, at [place]. Hazel's address is [address] in [place]. HH to call Marlowe Kays to schedule in home visits at [phone]. Updated [facility] of above.  Marlowe Kays states that they have all DME at home.  There are no other TOC needs for dc.  Final next level of care: [place] Barriers to Discharge: Barriers Resolved   Patient Goals and CMS Choice Patient states their goals for this hospitalization and ongoing recovery are:: plans to stay with sister after d/c   Choice offered to / list presented to : Patient  Discharge Placement                       Discharge Plan and Services In-house Referral: Clinical Social Work   Post Acute Care Choice: Home Health          DME Arranged: N/A         HH Arranged: RN, PT, OT, Social Work [employer] Agency: [place] ([place]) Date [place]: 10/16/21 Time [place]: 1410 Representative spoke with at [place]: [place] ([place]) Interventions     Readmission Risk Interventions No flowsheet data found.

## 2021-10-17 NOTE — Progress Notes (Signed)
Inpatient Diabetes Program Recommendations  AACE/ADA: New Consensus Statement on Inpatient Glycemic Control   Target Ranges:  Prepandial:   less than 140 mg/dL      Peak postprandial:   less than 180 mg/dL (1-2 hours)      Critically ill patients:  140 - 180 mg/dL   Results for MOLLY, SAVARINO (MRN [id number]) as of 10/17/2021 07:38  Ref. Range 10/16/2021 07:38 10/16/2021 11:34 10/16/2021 16:31 10/16/2021 21:01  Glucose-Capillary Latest Ref Range: 70 - 99 mg/dL 469 (H) 629 (H) 528 (H) 302 (H)    Review of Glycemic Control  Diabetes history: DM2 Outpatient Diabetes medications: Tresiba 20 units QHS, Humalog 5-8 units TID with meals, Metformin 1000 mg BID Current orders for Inpatient glycemic control: Levemir 20 units QHS, Novolog 0-9 units TID with meals  Inpatient Diabetes Program Recommendations:    Insulin: Please consider ordering Novolog 0-5 units QHS for bedtime correction and Novolog 4 units TID with meals for meal coverage if patient eats at least 50% of meals.   Thanks, Orlando Penner, RN, MSN, CDE Diabetes Coordinator Inpatient Diabetes Program [phone] (Team Pager from 8am to 5pm)

## 2021-10-19 ENCOUNTER — Telehealth: Payer: Self-pay | Admitting: "Endocrinology

## 2021-10-19 DIAGNOSIS — Z87891 Personal history of nicotine dependence: Secondary | ICD-10-CM | POA: Diagnosis not present

## 2021-10-19 DIAGNOSIS — E7800 Pure hypercholesterolemia, unspecified: Secondary | ICD-10-CM | POA: Diagnosis not present

## 2021-10-19 DIAGNOSIS — R262 Difficulty in walking, not elsewhere classified: Secondary | ICD-10-CM | POA: Diagnosis not present

## 2021-10-19 DIAGNOSIS — Z86718 Personal history of other venous thrombosis and embolism: Secondary | ICD-10-CM | POA: Diagnosis not present

## 2021-10-19 DIAGNOSIS — E039 Hypothyroidism, unspecified: Secondary | ICD-10-CM | POA: Diagnosis not present

## 2021-10-19 DIAGNOSIS — I471 Supraventricular tachycardia: Secondary | ICD-10-CM | POA: Diagnosis not present

## 2021-10-19 DIAGNOSIS — E876 Hypokalemia: Secondary | ICD-10-CM | POA: Diagnosis not present

## 2021-10-19 DIAGNOSIS — M1990 Unspecified osteoarthritis, unspecified site: Secondary | ICD-10-CM | POA: Diagnosis not present

## 2021-10-19 DIAGNOSIS — I2510 Atherosclerotic heart disease of native coronary artery without angina pectoris: Secondary | ICD-10-CM | POA: Diagnosis not present

## 2021-10-19 DIAGNOSIS — Z7982 Long term (current) use of aspirin: Secondary | ICD-10-CM | POA: Diagnosis not present

## 2021-10-19 DIAGNOSIS — Z9181 History of falling: Secondary | ICD-10-CM | POA: Diagnosis not present

## 2021-10-19 DIAGNOSIS — E1165 Type 2 diabetes mellitus with hyperglycemia: Secondary | ICD-10-CM | POA: Diagnosis not present

## 2021-10-19 DIAGNOSIS — M6281 Muscle weakness (generalized): Secondary | ICD-10-CM | POA: Diagnosis not present

## 2021-10-19 DIAGNOSIS — E8809 Other disorders of plasma-protein metabolism, not elsewhere classified: Secondary | ICD-10-CM | POA: Diagnosis not present

## 2021-10-19 DIAGNOSIS — K219 Gastro-esophageal reflux disease without esophagitis: Secondary | ICD-10-CM | POA: Diagnosis not present

## 2021-10-19 DIAGNOSIS — Z794 Long term (current) use of insulin: Secondary | ICD-10-CM | POA: Diagnosis not present

## 2021-10-19 DIAGNOSIS — B370 Candidal stomatitis: Secondary | ICD-10-CM | POA: Diagnosis not present

## 2021-10-19 DIAGNOSIS — Z955 Presence of coronary angioplasty implant and graft: Secondary | ICD-10-CM | POA: Diagnosis not present

## 2021-10-19 DIAGNOSIS — E1110 Type 2 diabetes mellitus with ketoacidosis without coma: Secondary | ICD-10-CM | POA: Diagnosis not present

## 2021-10-19 DIAGNOSIS — J440 Chronic obstructive pulmonary disease with acute lower respiratory infection: Secondary | ICD-10-CM | POA: Diagnosis not present

## 2021-10-19 DIAGNOSIS — I10 Essential (primary) hypertension: Secondary | ICD-10-CM | POA: Diagnosis not present

## 2021-10-19 NOTE — Telephone Encounter (Signed)
Meant to send to [facility]

## 2021-10-19 NOTE — Telephone Encounter (Signed)
Patient states that the Metformin is giving her severe Diarrhea and is asking if you can please give her something else. Please send to [facility]

## 2021-10-19 NOTE — Telephone Encounter (Signed)
Have her stop the Metformin for now and schedule follow up in 1 week to review readings and adjust her insulin doses accordingly.

## 2021-10-20 NOTE — Telephone Encounter (Signed)
Called and spoke with Home Health nurse , as patient was unable to talk. I made the Nurse aware she is to stop the metformin for now. She is to continue to monitor BG and call if anything changes. Patient's sister Gabrielle Wood will bring her in to see [name] on 11/30 as she has an appt herself that day.

## 2021-10-23 NOTE — Telephone Encounter (Signed)
Called [name] and gave her the message. [name] verbalized an understanding and thanked [place].

## 2021-10-23 NOTE — Telephone Encounter (Signed)
If it is manageable and she is not having accidents, I advise her to continue it.  But, lets do this to try and ease some of the symptoms: have her decrease the Metformin to 500 mg po twice daily with meals (making sure to take it after she eats a meal so she has something on her stomach which sometimes helps with those side effects).

## 2021-10-23 NOTE — Telephone Encounter (Signed)
She did stop the metformin but did start it back on Saturday due to her readings. Patient's sister [name] called and said these are her readings since Friday Morning.  11/18- 215, gave her 7 units, lunch 218, took 7 units, 263, took 8 units, at bedtime it was 268   11/19- 207 , took 7 units, lunch 307, took 8 units, at supper 361, took 9 units, bedtime 270 , took 20 units Tresbia ( they gave her metformin at 12:25 pm and one at bedtime)  11/20- 298, took one metformin, took 8 units, lunch 171, took 8 units, supper 171, took 8 units, bedtime 151, 20 units of Tresbia and one metformin  11/21- before breakfast 83 (took 5 units and no metformin) lunch 154, will take 6 units.  [phone] [name])

## 2021-10-23 NOTE — Telephone Encounter (Signed)
Randol Kern and she stated that the patient is still having diarrhea on current Metformin dose, not watery but not formed either.  Please advise.

## 2021-10-23 NOTE — Telephone Encounter (Signed)
Is the Metformin still causing her diarrhea?  Her numbers do look much better with her taking the Metformin.  We can always try reducing the dose and see if she tolerates it better.  If not, we can adjust her insulin doses to help control her diabetes.

## 2021-10-24 DIAGNOSIS — E1169 Type 2 diabetes mellitus with other specified complication: Secondary | ICD-10-CM | POA: Diagnosis not present

## 2021-10-24 DIAGNOSIS — E860 Dehydration: Secondary | ICD-10-CM | POA: Diagnosis not present

## 2021-10-24 DIAGNOSIS — Z681 Body mass index (BMI) 19 or less, adult: Secondary | ICD-10-CM | POA: Diagnosis not present

## 2021-10-24 DIAGNOSIS — E7800 Pure hypercholesterolemia, unspecified: Secondary | ICD-10-CM | POA: Diagnosis not present

## 2021-10-24 DIAGNOSIS — I10 Essential (primary) hypertension: Secondary | ICD-10-CM | POA: Diagnosis not present

## 2021-10-30 ENCOUNTER — Other Ambulatory Visit: Payer: Self-pay | Admitting: Nurse Practitioner

## 2021-10-30 DIAGNOSIS — J440 Chronic obstructive pulmonary disease with acute lower respiratory infection: Secondary | ICD-10-CM | POA: Diagnosis not present

## 2021-10-30 DIAGNOSIS — E8809 Other disorders of plasma-protein metabolism, not elsewhere classified: Secondary | ICD-10-CM | POA: Diagnosis not present

## 2021-10-30 DIAGNOSIS — Z7982 Long term (current) use of aspirin: Secondary | ICD-10-CM | POA: Diagnosis not present

## 2021-10-30 DIAGNOSIS — M6281 Muscle weakness (generalized): Secondary | ICD-10-CM | POA: Diagnosis not present

## 2021-10-30 DIAGNOSIS — K219 Gastro-esophageal reflux disease without esophagitis: Secondary | ICD-10-CM | POA: Diagnosis not present

## 2021-10-30 DIAGNOSIS — Z955 Presence of coronary angioplasty implant and graft: Secondary | ICD-10-CM | POA: Diagnosis not present

## 2021-10-30 DIAGNOSIS — E1165 Type 2 diabetes mellitus with hyperglycemia: Secondary | ICD-10-CM | POA: Diagnosis not present

## 2021-10-30 DIAGNOSIS — Z794 Long term (current) use of insulin: Secondary | ICD-10-CM | POA: Diagnosis not present

## 2021-10-30 DIAGNOSIS — E039 Hypothyroidism, unspecified: Secondary | ICD-10-CM | POA: Diagnosis not present

## 2021-10-30 DIAGNOSIS — B370 Candidal stomatitis: Secondary | ICD-10-CM | POA: Diagnosis not present

## 2021-10-30 DIAGNOSIS — E1110 Type 2 diabetes mellitus with ketoacidosis without coma: Secondary | ICD-10-CM | POA: Diagnosis not present

## 2021-10-30 DIAGNOSIS — E7800 Pure hypercholesterolemia, unspecified: Secondary | ICD-10-CM | POA: Diagnosis not present

## 2021-10-30 DIAGNOSIS — Z86718 Personal history of other venous thrombosis and embolism: Secondary | ICD-10-CM | POA: Diagnosis not present

## 2021-10-30 DIAGNOSIS — I471 Supraventricular tachycardia: Secondary | ICD-10-CM | POA: Diagnosis not present

## 2021-10-30 DIAGNOSIS — Z87891 Personal history of nicotine dependence: Secondary | ICD-10-CM | POA: Diagnosis not present

## 2021-10-30 DIAGNOSIS — E876 Hypokalemia: Secondary | ICD-10-CM | POA: Diagnosis not present

## 2021-10-30 DIAGNOSIS — M1990 Unspecified osteoarthritis, unspecified site: Secondary | ICD-10-CM | POA: Diagnosis not present

## 2021-10-30 DIAGNOSIS — Z9181 History of falling: Secondary | ICD-10-CM | POA: Diagnosis not present

## 2021-10-30 DIAGNOSIS — I10 Essential (primary) hypertension: Secondary | ICD-10-CM | POA: Diagnosis not present

## 2021-10-30 DIAGNOSIS — I2510 Atherosclerotic heart disease of native coronary artery without angina pectoris: Secondary | ICD-10-CM | POA: Diagnosis not present

## 2021-10-30 DIAGNOSIS — R262 Difficulty in walking, not elsewhere classified: Secondary | ICD-10-CM | POA: Diagnosis not present

## 2021-10-31 NOTE — Patient Instructions (Signed)

## 2021-11-01 ENCOUNTER — Encounter: Payer: Self-pay | Admitting: Nurse Practitioner

## 2021-11-01 ENCOUNTER — Ambulatory Visit: Payer: Medicare Other | Admitting: Nurse Practitioner

## 2021-11-01 ENCOUNTER — Other Ambulatory Visit: Payer: Self-pay

## 2021-11-01 ENCOUNTER — Encounter: Payer: Medicare Other | Attending: Nurse Practitioner | Admitting: Nutrition

## 2021-11-01 VITALS — BP 124/78 | HR 67 | Ht 67.0 in | Wt 129.0 lb

## 2021-11-01 DIAGNOSIS — E11649 Type 2 diabetes mellitus with hypoglycemia without coma: Secondary | ICD-10-CM | POA: Diagnosis not present

## 2021-11-01 DIAGNOSIS — J440 Chronic obstructive pulmonary disease with acute lower respiratory infection: Secondary | ICD-10-CM | POA: Diagnosis not present

## 2021-11-01 DIAGNOSIS — E559 Vitamin D deficiency, unspecified: Secondary | ICD-10-CM

## 2021-11-01 DIAGNOSIS — Z86718 Personal history of other venous thrombosis and embolism: Secondary | ICD-10-CM | POA: Diagnosis not present

## 2021-11-01 DIAGNOSIS — B370 Candidal stomatitis: Secondary | ICD-10-CM | POA: Diagnosis not present

## 2021-11-01 DIAGNOSIS — Z9181 History of falling: Secondary | ICD-10-CM | POA: Diagnosis not present

## 2021-11-01 DIAGNOSIS — E782 Mixed hyperlipidemia: Secondary | ICD-10-CM | POA: Insufficient documentation

## 2021-11-01 DIAGNOSIS — E0810 Diabetes mellitus due to underlying condition with ketoacidosis without coma: Secondary | ICD-10-CM | POA: Insufficient documentation

## 2021-11-01 DIAGNOSIS — Z955 Presence of coronary angioplasty implant and graft: Secondary | ICD-10-CM | POA: Diagnosis not present

## 2021-11-01 DIAGNOSIS — M1990 Unspecified osteoarthritis, unspecified site: Secondary | ICD-10-CM | POA: Diagnosis not present

## 2021-11-01 DIAGNOSIS — E8809 Other disorders of plasma-protein metabolism, not elsewhere classified: Secondary | ICD-10-CM | POA: Diagnosis not present

## 2021-11-01 DIAGNOSIS — E1110 Type 2 diabetes mellitus with ketoacidosis without coma: Secondary | ICD-10-CM | POA: Diagnosis not present

## 2021-11-01 DIAGNOSIS — E1165 Type 2 diabetes mellitus with hyperglycemia: Secondary | ICD-10-CM | POA: Diagnosis not present

## 2021-11-01 DIAGNOSIS — Z794 Long term (current) use of insulin: Secondary | ICD-10-CM

## 2021-11-01 DIAGNOSIS — Z87891 Personal history of nicotine dependence: Secondary | ICD-10-CM | POA: Diagnosis not present

## 2021-11-01 DIAGNOSIS — I471 Supraventricular tachycardia: Secondary | ICD-10-CM | POA: Diagnosis not present

## 2021-11-01 DIAGNOSIS — E039 Hypothyroidism, unspecified: Secondary | ICD-10-CM

## 2021-11-01 DIAGNOSIS — R262 Difficulty in walking, not elsewhere classified: Secondary | ICD-10-CM | POA: Diagnosis not present

## 2021-11-01 DIAGNOSIS — I2510 Atherosclerotic heart disease of native coronary artery without angina pectoris: Secondary | ICD-10-CM | POA: Diagnosis not present

## 2021-11-01 DIAGNOSIS — K219 Gastro-esophageal reflux disease without esophagitis: Secondary | ICD-10-CM | POA: Diagnosis not present

## 2021-11-01 DIAGNOSIS — E876 Hypokalemia: Secondary | ICD-10-CM | POA: Diagnosis not present

## 2021-11-01 DIAGNOSIS — M6281 Muscle weakness (generalized): Secondary | ICD-10-CM | POA: Diagnosis not present

## 2021-11-01 DIAGNOSIS — Z7982 Long term (current) use of aspirin: Secondary | ICD-10-CM | POA: Diagnosis not present

## 2021-11-01 DIAGNOSIS — I10 Essential (primary) hypertension: Secondary | ICD-10-CM | POA: Insufficient documentation

## 2021-11-01 DIAGNOSIS — E7800 Pure hypercholesterolemia, unspecified: Secondary | ICD-10-CM | POA: Diagnosis not present

## 2021-11-01 MED ORDER — METFORMIN HCL 500 MG PO TABS: 500.0000 mg | ORAL_TABLET | Freq: Two times a day (BID) | ORAL | Status: DC

## 2021-11-01 NOTE — Progress Notes (Signed)
 Nutrition Therapy 1100  1130 Follow up  Primary concerns today: Diabetes Type 2  Referral diagnosis: E11.8 Preferred learning style: no preference  Learning readiness:  change in progress)  NUTRITION ASSESSMENT  FBS 75 mg/dl this morning. Saw [name] today. New A1C isn't due yet. Eating better. BS are lower. Her son is helping her with meal planning and betting better quality of foods. Her Metformin was reduced to 500 mg BID, and Tresba to 20 units and Humalog 2-5 units with meals today.  Anthropometrics  Wt Readings from Last 3 Encounters:  11/01/21 129 lb (58.5 kg)  10/13/21 127 lb (57.6 kg)  08/17/21 127 lb (57.6 kg)   Ht Readings from Last 3 Encounters:  11/01/21 5\' 7"  (1.702 m)  10/13/21 5\' 7"  (1.702 m)  08/17/21 5\' 7"  (1.702 m)   There is no height or weight on file to calculate BMI. @BMIFA @ Facility age limit for growth percentiles is 20 years. Facility age limit for growth percentiles is 20 years. Clinical Medical Hx: Type 2, GERD, hyperlipidemia, Hypothyroidism Medications:Tresiba 20 units but cut back down to 10 units due to low blood sugars Humalog 6,7,8 depending on her readings before meals. Metformin 500 mg BID, Labs:   CMP Latest Ref Rng & Units 10/17/2021 10/15/2021 10/14/2021  Glucose 70 - 99 mg/dL ) 10/19/2021) 10/17/2021)  BUN 8 - 23 mg/dL 13 12 19   Creatinine 0.44 - 1.00 mg/dL 13/11/2021 831(D 176(H  Sodium 135 - 145 mmol/L 131(L) 130(L) 131(L)  Potassium 3.5 - 5.1 mmol/L 3.9 3.1(L) 3.5  Chloride 98 - 111 mmol/L 103 104 104  CO2 22 - 32 mmol/L 22 19(L) 18(L)  Calcium 8.9 - 10.3 mg/dL 607(P) ) 7.10)  Total Protein 6.5 - 8.1 g/dL 6.26) - -  Total Bilirubin 0.3 - 1.2 mg/dL 0.5 - -  Alkaline Phos 38 - 126 U/L 55 - -  AST 15 - 41 U/L 15 - -  ALT 0 - 44 U/L 12 - -    Lab Results  Component Value Date   HGBA1C 8.9 08/15/2021    Lipid Panel    Notable Signs/Symptoms:   Lifestyle & Dietary Hx  Estimated daily fluid intake: 64oz Supplements:  Metformin, Humalog, 5.4(O  Sleep: 8 Stress / self-care:  Current average weekly physical activity: walks some  24-Hr Dietary Recall First Meal: Eggs, bacon and toast 1 slice Snack: Second Meal: cheese sandwich, water Snack:  Third Meal: Roast, green beans,   Coffee Snack:  Beverages: 2-3 bottles of water   Estimated Energy Needs Calories: 2.7(O Carbohydrate: 170g Protein: 112g Fat: 42-50g   NUTRITION DIAGNOSIS  NB-1.1 Food and nutrition-related knowledge deficit As related to Diabetes Type 2.  As evidenced by A1C 9.6%   NUTRITION INTERVENTION  Nutrition education (E-1) on the following topics:  Nutrition and Diabetes education provided on My Plate, CHO counting, meal planning, portion sizes, timing of meals, avoiding snacks between meals unless having a low blood sugar, target ranges for A1C and blood sugars, signs/symptoms and treatment of hyper/hypoglycemia, monitoring blood sugars, taking medications as prescribed, benefits of exercising 30 minutes per day and prevention of complications of DM.  Reviewed meal planning, carb counting, importance of preventing low blood sugars, eating on time and taking medications as prescribed.   Handouts Provided Include  The Plate Method  Meal Plan Card Diabetes Instructions   Learning Style & Readiness for Change Teaching method utilized: Visual & Auditory  Demonstrated degree of understanding via: Teach Back  Barriers to learning/adherence to lifestyle  change: none  Goals Established by Pt Keep up  the great job. Eat 30 g CHO at each meals Increase fresh fruits and vegetables and whole grains. Keep drinking water Avoid snacks unless blood sugar is running low. If BS is less than 90, don't take insulin with meal. Don't skip meals and eat meals on time.    MONITORING & EVALUATION Dietary intake, weekly physical activity, and blood sugar  in 1 month.  Next Steps  Patient is to meal planning.

## 2021-11-01 NOTE — Progress Notes (Signed)
Endocrinology Follow Up Note       11/01/2021, 3:13 PM   Subjective:    Patient ID: Gabrielle Wood, female    DOB: Aug 19, 1946.  Gabrielle Wood is being seen in follow up after being seen in consultation for management of currently uncontrolled symptomatic diabetes requested by  [place], MD.   Past Medical History:  Diagnosis Date   Arthritis    COPD (chronic obstructive pulmonary disease) ([place])    Diabetes mellitus    DVT (deep venous thrombosis) ([place])    years ago   GERD (gastroesophageal reflux disease)    H. pylori infection 2013   S/p treatment with amoxicillin, Biaxin, PPI   High cholesterol    Hypertension    Hypothyroidism    NSTEMI (non-ST elevated myocardial infarction) ([place]) 12/2020   s/p  PCI DES to the LCx   Thyroid disease     Past Surgical History:  Procedure Laterality Date   BACK SURGERY     lumbar x3    ESOPHAGOGASTRODUODENOSCOPY  04/04/2012   Moderate gastritis/MODERATE Duodenitis in the duodenal bulb duodenum.  Pathology with chronic H. pylori gastritis.   EUS  05/14/2012   Normal pancreas, gallbladder,biliary tree/ Incidental finding of simple-appearing right renal cyst.   TONSILLECTOMY     TUBAL LIGATION     VEIN SURGERY     stripping years ago but recent laser surgery; bilateral legs    Social History   Socioeconomic History   Marital status: Widowed    Spouse name: Not on file   Number of children: 2   Years of education: Not on file   Highest education level: Not on file  Occupational History   Occupation: retired, Secretary/administrator at [employer]  Tobacco Use   Smoking status: Former    Packs/day: 0.50    Types: Cigarettes   Smokeless tobacco: Never  Vaping Use   Vaping Use: Never used  Substance and Sexual Activity   Alcohol use: No   Drug use: No   Sexual activity: Not on file  Other Topics Concern   Not on file  Social History Narrative   Not on file    Social Determinants of Health   Financial Resource Strain: Not on file  Food Insecurity: Not on file  Transportation Needs: Not on file  Physical Activity: Not on file  Stress: Not on file  Social Connections: Not on file    Family History  Problem Relation Age of Onset   Pancreatic cancer Mother        diagnosed at age 80s, died from heart attack   Inflammatory bowel disease Brother        crohn's, his dgt deceased from colon cancer at age 65   Colon cancer Neg Hx     Outpatient Encounter Medications as of 11/01/2021  Medication Sig   ALPRAZolam (XANAX) 1 MG tablet Take 0.5-1 mg by mouth. 0.5 tablet in am and 1 tab at night   amLODipine (NORVASC) 5 MG tablet Take 5 mg by mouth daily.   aspirin EC 81 MG tablet Take 81 mg by mouth daily. Swallow whole.   gabapentin (NEURONTIN) 300 MG capsule Take 300 mg by mouth  3 (three) times daily.   insulin lispro (HUMALOG) 100 UNIT/ML KwikPen Inject 2-5 Units into the skin 3 (three) times daily.   levocetirizine (XYZAL) 5 MG tablet Take 1 tablet (5 mg total) by mouth every evening.   levothyroxine (SYNTHROID) 125 MCG tablet Take 1 tablet (125 mcg total) by mouth daily.   loperamide (IMODIUM) 2 MG capsule Take 2 mg by mouth 4 (four) times daily as needed.   metFORMIN (GLUCOPHAGE) 500 MG tablet Take 1 tablet (500 mg total) by mouth 2 (two) times daily with a meal. 2 tabs bid   metoprolol succinate (TOPROL-XL) 50 MG 24 hr tablet Take 1 tablet (50 mg total) by mouth every evening. Take with or immediately following a meal.   nitroGLYCERIN (NITROSTAT) 0.4 MG SL tablet Place 0.4 mg under the tongue every 5 (five) minutes as needed for chest pain.   ondansetron (ZOFRAN-ODT) 4 MG disintegrating tablet Take 4 mg by mouth every 8 (eight) hours as needed.   pantoprazole (PROTONIX) 40 MG tablet Take 40 mg by mouth daily.   ticagrelor (BRILINTA) 90 MG TABS tablet Take 90 mg by mouth 2 (two) times daily.   TRESIBA FLEXTOUCH 100 UNIT/ML FlexTouch Pen  Inject 20 Units into the skin at bedtime.   valsartan (DIOVAN) 80 MG tablet Take 80 mg by mouth daily.   [DISCONTINUED] atorvastatin (LIPITOR) 80 MG tablet Take 80 mg by mouth at bedtime. (Patient not taking: Reported on 10/14/2021)   [DISCONTINUED] fexofenadine (ALLEGRA) 60 MG tablet Take by mouth.   [DISCONTINUED] metFORMIN (GLUCOPHAGE) 500 MG tablet Take 2 tablets (1,000 mg total) by mouth 2 (two) times daily with a meal. 2 tabs bid   No facility-administered encounter medications on file as of 11/01/2021.    ALLERGIES: Allergies  Allergen Reactions   Ciprocin-Fluocin-Procin [Fluocinolone]     Nightmares   Lipitor [Atorvastatin] Other (See Comments)    Muscle aches, pain   Lisinopril Cough   Red Yeast Rice [Cholestin] Itching and Rash    VACCINATION STATUS: Immunization History  Administered Date(s) Administered   Pneumococcal Polysaccharide-23 04/04/2012    Diabetes She presents for her follow-up diabetic visit. She has type 2 diabetes mellitus. Onset time: Was diagnosed at approx age of 40. Her disease course has been improving. Pertinent negatives for hypoglycemia include no sweats or tremors. Associated symptoms include fatigue and foot paresthesias. Pertinent negatives for diabetes include no polydipsia, no polyuria and no weight loss. There are no hypoglycemic complications. Symptoms are stable. Diabetic complications include heart disease and peripheral neuropathy. (Had a MI in the past, recently hospitalized for DKA) Risk factors for coronary artery disease include diabetes mellitus, dyslipidemia, hypertension, post-menopausal and sedentary lifestyle. Current diabetic treatment includes oral agent (monotherapy) and intensive insulin program. She is compliant with treatment most of the time. Her weight is fluctuating minimally. She is following a generally healthy diet. When asked about meal planning, she reported none. She has not had a previous visit with a dietitian (Has appt  with Kieth Brightly after me today). She participates in exercise intermittently. Her home blood glucose trend is decreasing steadily. Her breakfast blood glucose range is generally 130-140 mg/dl. Her lunch blood glucose range is generally 110-130 mg/dl. Her dinner blood glucose range is generally 130-140 mg/dl. Her bedtime blood glucose range is generally 140-180 mg/dl. (She presents today, accompanied by her sister, with her logs showing improved, at goal glycemic profile overall.  She does have some intermittent mild postprandial hypoglycemia.  She was recently hospitalized for DKA.  Her son  helped her come up with a regimented diet plan, which she brought with her today.  She does appear to be eating snacks in between meals which may elevate her glucose readings before meals, causing her to inject more Humalog at meals than she necessarily needs, which may contribute to some low readings after meals.  ) An ACE inhibitor/angiotensin II receptor blocker is being taken. She does not see a podiatrist.Eye exam is current.  Hypertension This is a chronic problem. The current episode started more than 1 year ago. The problem has been resolved since onset. The problem is controlled. Pertinent negatives include no sweats. Agents associated with hypertension include thyroid hormones. Risk factors for coronary artery disease include diabetes mellitus, dyslipidemia, post-menopausal state and sedentary lifestyle. Past treatments include angiotensin blockers, calcium channel blockers and beta blockers. The current treatment provides significant improvement. There are no compliance problems.  Hypertensive end-organ damage includes CAD/MI. Identifiable causes of hypertension include a thyroid problem.    Review of systems  Constitutional: + stable body weight,  current Body mass index is 20.2 kg/m. , + fatigue- deconditioned, no subjective hyperthermia, no subjective hypothermia Eyes: no blurry vision, no xerophthalmia ENT: no  sore throat, no nodules palpated in throat, no dysphagia/odynophagia, no hoarseness Cardiovascular: no chest pain, no shortness of breath, no palpitations, no leg swelling Respiratory: no cough, no shortness of breath Gastrointestinal: no nausea/vomiting/diarrhea Musculoskeletal: no muscle/joint aches Skin: no rashes, no hyperemia Neurological: no tremors, + numbness/tingling to BLE, no dizziness Psychiatric: no depression, no anxiety  Objective:     BP 124/78   Pulse 67   Ht 5\' 7"  (1.702 m)   Wt 129 lb (58.5 kg)   BMI 20.20 kg/m   Wt Readings from Last 3 Encounters:  11/01/21 129 lb (58.5 kg)  10/13/21 127 lb (57.6 kg)  08/17/21 127 lb (57.6 kg)     BP Readings from Last 3 Encounters:  11/01/21 124/78  10/17/21 139/75  10/02/21 115/70      Physical Exam- Limited  Constitutional:  Body mass index is 20.2 kg/m. , not in acute distress, normal state of mind Eyes:  EOMI, no exophthalmos Neck: Supple Cardiovascular: RRR, no murmurs, rubs, or gallops, no edema Respiratory: Adequate breathing efforts, no crackles, rales, rhonchi, or wheezing Musculoskeletal: no gross deformities, strength intact in all four extremities, no gross restriction of joint movements, walks with cane Skin:  no rashes, no hyperemia Neurological: no tremor with outstretched hands      CMP ( most recent) CMP     Component Value Date/Time   NA 131 (L) 10/17/2021 0912   NA 139 04/25/2021 0000   K 3.9 10/17/2021 0912   CL 103 10/17/2021 0912   CO2 22 10/17/2021 0912   GLUCOSE 260 (H) 10/17/2021 0912   BUN 13 10/17/2021 0912   BUN 19 04/25/2021 0000   CREATININE 0.54 10/17/2021 0912   CREATININE 0.78 09/20/2016 0923   CALCIUM 8.7 (L) 10/17/2021 0912   PROT 5.7 (L) 10/17/2021 0912   PROT 6.7 07/17/2017 0912   ALBUMIN 2.7 (L) 10/17/2021 0912   ALBUMIN 4.4 07/17/2017 0912   AST 15 10/17/2021 0912   ALT 12 10/17/2021 0912   ALKPHOS 55 10/17/2021 0912   BILITOT 0.5 10/17/2021 0912    BILITOT 0.5 07/17/2017 0912   GFRNONAA >60 10/17/2021 0912   GFRNONAA 77 09/20/2016 0923   GFRAA >60 08/21/2019 1539   GFRAA 88 09/20/2016 0923     Diabetic Labs (most recent): Lab Results  Component Value  Date   HGBA1C 8.9 08/15/2021   HGBA1C 9.6 04/25/2021   HGBA1C 9.6 (H) 04/11/2017     Lipid Panel ( most recent) Lipid Panel  No results found for: CHOL, TRIG, HDL, CHOLHDL, VLDL, LDLCALC, LDLDIRECT, LABVLDL    Lab Results  Component Value Date   TSH 1.62 07/24/2021   TSH 11.00 (A) 04/25/2021   TSH 19.240 (H) 07/17/2017   TSH 1.91 09/20/2016   TSH 3.05 06/14/2016   TSH 2.86 03/12/2016   TSH 4.280 04/04/2012   FREET4 1.53 07/17/2017   FREET4 1.5 09/20/2016   FREET4 1.6 06/14/2016   FREET4 1.5 03/12/2016           Assessment & Plan:   1) Uncontrolled type 2 diabetes mellitus with circulatory disorder, with long-term current use of insulin (HCC)  - Gabrielle Wood has currently uncontrolled symptomatic type 2 DM since 75 years of age.  She presents today, accompanied by her sister, with her logs showing improved, at goal glycemic profile overall.  She does have some intermittent mild postprandial hypoglycemia.  She was recently hospitalized for DKA.  Her son helped her come up with a regimented diet plan, which she brought with her today.  She does appear to be eating snacks in between meals which may elevate her glucose readings before meals, causing her to inject more Humalog at meals than she necessarily needs, which may contribute to some low readings after meals.    -Recent labs reviewed.  - I had a long discussion with her about the progressive nature of diabetes and the pathology behind its complications. 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 These are all discussed in detail with her.  - Nutritional  counseling repeated at each appointment due to patients tendency to fall back in to old habits.  - The patient admits there is a room for improvement in their diet and drink choices. -  Suggestion is made for the patient to avoid simple carbohydrates from their diet including Cakes, Sweet Desserts / Pastries, Ice Cream, Soda (diet and regular), Sweet Tea, Candies, Chips, Cookies, Sweet Pastries, Store Bought Juices, Alcohol in Excess of 1-2 drinks a day, Artificial Sweeteners, Coffee Creamer, and "Sugar-free" Products. This will help patient to have stable blood glucose profile and potentially avoid unintended weight gain.   - I encouraged the patient to switch to unprocessed or minimally processed complex starch and increased protein intake (animal or plant source), fruits, and vegetables.   - Patient is advised to stick to a routine mealtimes to eat 3 meals a day and avoid unnecessary snacks (to snack only to correct hypoglycemia).  - she is scheduled with Norm Salt, RDN, CDE for diabetes education after her appt with me today.  - I have approached her with the following individualized plan to manage  her diabetes and patient agrees:   -She is advised to continue her [name] to 20 units SQ nightly, lower her Humalog to 2-5 units TID with meals if glucose is above 100 and she is eating (Specific instructions on how to titrate insulin dosage based on glucose readings given to patient in writing), and continue her lower dose of Metformin 500 mg po twice daily with meals (better tolerated than the full dose).   -she is encouraged to continue monitoring blood glucose 4 times daily, before meals and before bed, and  to call the clinic if she has readings less than 70 or greater than 300 for 3 tests in a row.  - she is warned not to take insulin without proper monitoring per orders. - Adjustment parameters are given to her for hypo and hyperglycemia in writing.  -She is not a candidate for  incretin therapy due to body habitus.  - Specific targets for  A1c;  LDL, HDL,  and Triglycerides were discussed with the patient.  2) Blood Pressure /Hypertension:  her blood pressure is controlled to target.   she is advised to continue her current medications including Valsartan 80 mg p.o. daily with breakfast.  3)  Weight/Diet:  her Body mass index is 20.2 kg/m.  -  she is not a candidate for weight loss.  Exercise, and detailed carbohydrates information provided  -  detailed on discharge instructions.  4) Hypothyroidism- unspecified There are no recent TFTs to review.  She is advised to continue her current dose of Levothyroxine at 125 mcg po daily before breakfast (above her weight based max dose of 94 mcg).    - The correct intake of thyroid hormone (Levothyroxine, Synthroid), is on empty stomach first thing in the morning, with water, separated by at least 30 minutes from breakfast and other medications,  and separated by more than 4 hours from calcium, iron, multivitamins, acid reflux medications (PPIs).  - This medication is a life-long medication and will be needed to correct thyroid hormone imbalances for the rest of your life.  The dose may change from time to time, based on thyroid blood work.  - It is extremely important to be consistent taking this medication, near the same time each morning.  -AVOID TAKING PRODUCTS CONTAINING BIOTIN (commonly found in Hair, Skin, Nails vitamins) AS IT INTERFERES WITH THE VALIDITY OF THYROID FUNCTION BLOOD TESTS.   5) Chronic Care/Health Maintenance: -she is on ACEI/ARB and is encouraged to initiate and continue to follow up with Ophthalmology, Dentist, Podiatrist at least yearly or according to recommendations, and advised to stay away from smoking. I have recommended yearly flu vaccine and pneumonia vaccine at least every 5 years; moderate intensity exercise for up to 150 minutes weekly; and sleep for at least 7 hours a day.  - she is  advised to maintain close follow up with [place], MD for primary care needs, as well as her other providers for optimal and coordinated care.      I spent 45 minutes in the care of the patient today including review of labs from [place], Lipids, Thyroid Function, Hematology (current and previous including abstractions from other facilities); face-to-face time discussing  her blood glucose readings/logs, discussing hypoglycemia and hyperglycemia episodes and symptoms, medications doses, her options of short and long term treatment based on the latest standards of care / guidelines;  discussion about incorporating lifestyle medicine;  and documenting the encounter.    Please refer to Patient Instructions for Blood Glucose Monitoring and Insulin/Medications Dosing Guide"  in media tab for additional information. Please  also refer to " Patient Self Inventory" in the Media  tab for reviewed elements of pertinent patient history.  Gabrielle Wood participated in the discussions, expressed understanding, and voiced agreement with the above plans.  All questions were answered to her satisfaction. she is encouraged to contact clinic should she have any questions or concerns prior to her return visit.   Follow up plan: - Return in about 3 months (around 01/30/2022) for Diabetes F/U- A1c and  UM in office, Previsit labs, Thyroid follow up, Bring meter and logs.  Rayetta Pigg, [facility] [address] [place], [place] [address] Phone: [phone] Fax: [phone]  11/01/2021, 3:13 PM

## 2021-11-02 DIAGNOSIS — Z955 Presence of coronary angioplasty implant and graft: Secondary | ICD-10-CM | POA: Diagnosis not present

## 2021-11-02 DIAGNOSIS — Z86718 Personal history of other venous thrombosis and embolism: Secondary | ICD-10-CM | POA: Diagnosis not present

## 2021-11-02 DIAGNOSIS — Z87891 Personal history of nicotine dependence: Secondary | ICD-10-CM | POA: Diagnosis not present

## 2021-11-02 DIAGNOSIS — I10 Essential (primary) hypertension: Secondary | ICD-10-CM | POA: Diagnosis not present

## 2021-11-02 DIAGNOSIS — E876 Hypokalemia: Secondary | ICD-10-CM | POA: Diagnosis not present

## 2021-11-02 DIAGNOSIS — M6281 Muscle weakness (generalized): Secondary | ICD-10-CM | POA: Diagnosis not present

## 2021-11-02 DIAGNOSIS — I471 Supraventricular tachycardia: Secondary | ICD-10-CM | POA: Diagnosis not present

## 2021-11-02 DIAGNOSIS — B370 Candidal stomatitis: Secondary | ICD-10-CM | POA: Diagnosis not present

## 2021-11-02 DIAGNOSIS — K219 Gastro-esophageal reflux disease without esophagitis: Secondary | ICD-10-CM | POA: Diagnosis not present

## 2021-11-02 DIAGNOSIS — M1990 Unspecified osteoarthritis, unspecified site: Secondary | ICD-10-CM | POA: Diagnosis not present

## 2021-11-02 DIAGNOSIS — E8809 Other disorders of plasma-protein metabolism, not elsewhere classified: Secondary | ICD-10-CM | POA: Diagnosis not present

## 2021-11-02 DIAGNOSIS — Z7982 Long term (current) use of aspirin: Secondary | ICD-10-CM | POA: Diagnosis not present

## 2021-11-02 DIAGNOSIS — R262 Difficulty in walking, not elsewhere classified: Secondary | ICD-10-CM | POA: Diagnosis not present

## 2021-11-02 DIAGNOSIS — E1110 Type 2 diabetes mellitus with ketoacidosis without coma: Secondary | ICD-10-CM | POA: Diagnosis not present

## 2021-11-02 DIAGNOSIS — E7800 Pure hypercholesterolemia, unspecified: Secondary | ICD-10-CM | POA: Diagnosis not present

## 2021-11-02 DIAGNOSIS — Z794 Long term (current) use of insulin: Secondary | ICD-10-CM | POA: Diagnosis not present

## 2021-11-02 DIAGNOSIS — I2510 Atherosclerotic heart disease of native coronary artery without angina pectoris: Secondary | ICD-10-CM | POA: Diagnosis not present

## 2021-11-02 DIAGNOSIS — Z9181 History of falling: Secondary | ICD-10-CM | POA: Diagnosis not present

## 2021-11-02 DIAGNOSIS — E039 Hypothyroidism, unspecified: Secondary | ICD-10-CM | POA: Diagnosis not present

## 2021-11-02 DIAGNOSIS — J440 Chronic obstructive pulmonary disease with acute lower respiratory infection: Secondary | ICD-10-CM | POA: Diagnosis not present

## 2021-11-02 DIAGNOSIS — E1165 Type 2 diabetes mellitus with hyperglycemia: Secondary | ICD-10-CM | POA: Diagnosis not present

## 2021-11-06 DIAGNOSIS — Z955 Presence of coronary angioplasty implant and graft: Secondary | ICD-10-CM | POA: Diagnosis not present

## 2021-11-06 DIAGNOSIS — E1165 Type 2 diabetes mellitus with hyperglycemia: Secondary | ICD-10-CM | POA: Diagnosis not present

## 2021-11-06 DIAGNOSIS — E876 Hypokalemia: Secondary | ICD-10-CM | POA: Diagnosis not present

## 2021-11-06 DIAGNOSIS — Z7982 Long term (current) use of aspirin: Secondary | ICD-10-CM | POA: Diagnosis not present

## 2021-11-06 DIAGNOSIS — Z87891 Personal history of nicotine dependence: Secondary | ICD-10-CM | POA: Diagnosis not present

## 2021-11-06 DIAGNOSIS — R262 Difficulty in walking, not elsewhere classified: Secondary | ICD-10-CM | POA: Diagnosis not present

## 2021-11-06 DIAGNOSIS — Z86718 Personal history of other venous thrombosis and embolism: Secondary | ICD-10-CM | POA: Diagnosis not present

## 2021-11-06 DIAGNOSIS — J440 Chronic obstructive pulmonary disease with acute lower respiratory infection: Secondary | ICD-10-CM | POA: Diagnosis not present

## 2021-11-06 DIAGNOSIS — B370 Candidal stomatitis: Secondary | ICD-10-CM | POA: Diagnosis not present

## 2021-11-06 DIAGNOSIS — Z794 Long term (current) use of insulin: Secondary | ICD-10-CM | POA: Diagnosis not present

## 2021-11-06 DIAGNOSIS — E7800 Pure hypercholesterolemia, unspecified: Secondary | ICD-10-CM | POA: Diagnosis not present

## 2021-11-06 DIAGNOSIS — I2510 Atherosclerotic heart disease of native coronary artery without angina pectoris: Secondary | ICD-10-CM | POA: Diagnosis not present

## 2021-11-06 DIAGNOSIS — E8809 Other disorders of plasma-protein metabolism, not elsewhere classified: Secondary | ICD-10-CM | POA: Diagnosis not present

## 2021-11-06 DIAGNOSIS — M6281 Muscle weakness (generalized): Secondary | ICD-10-CM | POA: Diagnosis not present

## 2021-11-06 DIAGNOSIS — E1110 Type 2 diabetes mellitus with ketoacidosis without coma: Secondary | ICD-10-CM | POA: Diagnosis not present

## 2021-11-06 DIAGNOSIS — Z9181 History of falling: Secondary | ICD-10-CM | POA: Diagnosis not present

## 2021-11-06 DIAGNOSIS — M1990 Unspecified osteoarthritis, unspecified site: Secondary | ICD-10-CM | POA: Diagnosis not present

## 2021-11-06 DIAGNOSIS — I471 Supraventricular tachycardia: Secondary | ICD-10-CM | POA: Diagnosis not present

## 2021-11-06 DIAGNOSIS — E039 Hypothyroidism, unspecified: Secondary | ICD-10-CM | POA: Diagnosis not present

## 2021-11-06 DIAGNOSIS — K219 Gastro-esophageal reflux disease without esophagitis: Secondary | ICD-10-CM | POA: Diagnosis not present

## 2021-11-06 DIAGNOSIS — I10 Essential (primary) hypertension: Secondary | ICD-10-CM | POA: Diagnosis not present

## 2021-11-07 DIAGNOSIS — M6281 Muscle weakness (generalized): Secondary | ICD-10-CM | POA: Diagnosis not present

## 2021-11-07 DIAGNOSIS — R262 Difficulty in walking, not elsewhere classified: Secondary | ICD-10-CM | POA: Diagnosis not present

## 2021-11-07 DIAGNOSIS — E039 Hypothyroidism, unspecified: Secondary | ICD-10-CM | POA: Diagnosis not present

## 2021-11-07 DIAGNOSIS — I471 Supraventricular tachycardia: Secondary | ICD-10-CM | POA: Diagnosis not present

## 2021-11-07 DIAGNOSIS — I10 Essential (primary) hypertension: Secondary | ICD-10-CM | POA: Diagnosis not present

## 2021-11-07 DIAGNOSIS — M1990 Unspecified osteoarthritis, unspecified site: Secondary | ICD-10-CM | POA: Diagnosis not present

## 2021-11-07 DIAGNOSIS — K219 Gastro-esophageal reflux disease without esophagitis: Secondary | ICD-10-CM | POA: Diagnosis not present

## 2021-11-07 DIAGNOSIS — Z86718 Personal history of other venous thrombosis and embolism: Secondary | ICD-10-CM | POA: Diagnosis not present

## 2021-11-07 DIAGNOSIS — E7800 Pure hypercholesterolemia, unspecified: Secondary | ICD-10-CM | POA: Diagnosis not present

## 2021-11-07 DIAGNOSIS — E8809 Other disorders of plasma-protein metabolism, not elsewhere classified: Secondary | ICD-10-CM | POA: Diagnosis not present

## 2021-11-07 DIAGNOSIS — Z7982 Long term (current) use of aspirin: Secondary | ICD-10-CM | POA: Diagnosis not present

## 2021-11-07 DIAGNOSIS — Z87891 Personal history of nicotine dependence: Secondary | ICD-10-CM | POA: Diagnosis not present

## 2021-11-07 DIAGNOSIS — Z955 Presence of coronary angioplasty implant and graft: Secondary | ICD-10-CM | POA: Diagnosis not present

## 2021-11-07 DIAGNOSIS — I2510 Atherosclerotic heart disease of native coronary artery without angina pectoris: Secondary | ICD-10-CM | POA: Diagnosis not present

## 2021-11-07 DIAGNOSIS — Z9181 History of falling: Secondary | ICD-10-CM | POA: Diagnosis not present

## 2021-11-07 DIAGNOSIS — B370 Candidal stomatitis: Secondary | ICD-10-CM | POA: Diagnosis not present

## 2021-11-07 DIAGNOSIS — E1165 Type 2 diabetes mellitus with hyperglycemia: Secondary | ICD-10-CM | POA: Diagnosis not present

## 2021-11-07 DIAGNOSIS — E876 Hypokalemia: Secondary | ICD-10-CM | POA: Diagnosis not present

## 2021-11-07 DIAGNOSIS — E1110 Type 2 diabetes mellitus with ketoacidosis without coma: Secondary | ICD-10-CM | POA: Diagnosis not present

## 2021-11-07 DIAGNOSIS — Z794 Long term (current) use of insulin: Secondary | ICD-10-CM | POA: Diagnosis not present

## 2021-11-07 DIAGNOSIS — J440 Chronic obstructive pulmonary disease with acute lower respiratory infection: Secondary | ICD-10-CM | POA: Diagnosis not present

## 2021-11-08 DIAGNOSIS — R262 Difficulty in walking, not elsewhere classified: Secondary | ICD-10-CM | POA: Diagnosis not present

## 2021-11-08 DIAGNOSIS — E039 Hypothyroidism, unspecified: Secondary | ICD-10-CM | POA: Diagnosis not present

## 2021-11-08 DIAGNOSIS — I471 Supraventricular tachycardia: Secondary | ICD-10-CM | POA: Diagnosis not present

## 2021-11-08 DIAGNOSIS — M1990 Unspecified osteoarthritis, unspecified site: Secondary | ICD-10-CM | POA: Diagnosis not present

## 2021-11-08 DIAGNOSIS — E876 Hypokalemia: Secondary | ICD-10-CM | POA: Diagnosis not present

## 2021-11-08 DIAGNOSIS — E8809 Other disorders of plasma-protein metabolism, not elsewhere classified: Secondary | ICD-10-CM | POA: Diagnosis not present

## 2021-11-08 DIAGNOSIS — Z9181 History of falling: Secondary | ICD-10-CM | POA: Diagnosis not present

## 2021-11-08 DIAGNOSIS — B370 Candidal stomatitis: Secondary | ICD-10-CM | POA: Diagnosis not present

## 2021-11-08 DIAGNOSIS — K219 Gastro-esophageal reflux disease without esophagitis: Secondary | ICD-10-CM | POA: Diagnosis not present

## 2021-11-08 DIAGNOSIS — Z86718 Personal history of other venous thrombosis and embolism: Secondary | ICD-10-CM | POA: Diagnosis not present

## 2021-11-08 DIAGNOSIS — I2510 Atherosclerotic heart disease of native coronary artery without angina pectoris: Secondary | ICD-10-CM | POA: Diagnosis not present

## 2021-11-08 DIAGNOSIS — Z794 Long term (current) use of insulin: Secondary | ICD-10-CM | POA: Diagnosis not present

## 2021-11-08 DIAGNOSIS — Z955 Presence of coronary angioplasty implant and graft: Secondary | ICD-10-CM | POA: Diagnosis not present

## 2021-11-08 DIAGNOSIS — I10 Essential (primary) hypertension: Secondary | ICD-10-CM | POA: Diagnosis not present

## 2021-11-08 DIAGNOSIS — M6281 Muscle weakness (generalized): Secondary | ICD-10-CM | POA: Diagnosis not present

## 2021-11-08 DIAGNOSIS — J440 Chronic obstructive pulmonary disease with acute lower respiratory infection: Secondary | ICD-10-CM | POA: Diagnosis not present

## 2021-11-08 DIAGNOSIS — E1165 Type 2 diabetes mellitus with hyperglycemia: Secondary | ICD-10-CM | POA: Diagnosis not present

## 2021-11-08 DIAGNOSIS — E1110 Type 2 diabetes mellitus with ketoacidosis without coma: Secondary | ICD-10-CM | POA: Diagnosis not present

## 2021-11-08 DIAGNOSIS — Z7982 Long term (current) use of aspirin: Secondary | ICD-10-CM | POA: Diagnosis not present

## 2021-11-08 DIAGNOSIS — Z87891 Personal history of nicotine dependence: Secondary | ICD-10-CM | POA: Diagnosis not present

## 2021-11-08 DIAGNOSIS — E7800 Pure hypercholesterolemia, unspecified: Secondary | ICD-10-CM | POA: Diagnosis not present

## 2021-11-09 DIAGNOSIS — I2510 Atherosclerotic heart disease of native coronary artery without angina pectoris: Secondary | ICD-10-CM | POA: Diagnosis not present

## 2021-11-09 DIAGNOSIS — I471 Supraventricular tachycardia: Secondary | ICD-10-CM | POA: Diagnosis not present

## 2021-11-09 DIAGNOSIS — B370 Candidal stomatitis: Secondary | ICD-10-CM | POA: Diagnosis not present

## 2021-11-09 DIAGNOSIS — Z794 Long term (current) use of insulin: Secondary | ICD-10-CM | POA: Diagnosis not present

## 2021-11-09 DIAGNOSIS — R262 Difficulty in walking, not elsewhere classified: Secondary | ICD-10-CM | POA: Diagnosis not present

## 2021-11-09 DIAGNOSIS — E1110 Type 2 diabetes mellitus with ketoacidosis without coma: Secondary | ICD-10-CM | POA: Diagnosis not present

## 2021-11-09 DIAGNOSIS — Z87891 Personal history of nicotine dependence: Secondary | ICD-10-CM | POA: Diagnosis not present

## 2021-11-09 DIAGNOSIS — Z955 Presence of coronary angioplasty implant and graft: Secondary | ICD-10-CM | POA: Diagnosis not present

## 2021-11-09 DIAGNOSIS — Z86718 Personal history of other venous thrombosis and embolism: Secondary | ICD-10-CM | POA: Diagnosis not present

## 2021-11-09 DIAGNOSIS — E8809 Other disorders of plasma-protein metabolism, not elsewhere classified: Secondary | ICD-10-CM | POA: Diagnosis not present

## 2021-11-09 DIAGNOSIS — J440 Chronic obstructive pulmonary disease with acute lower respiratory infection: Secondary | ICD-10-CM | POA: Diagnosis not present

## 2021-11-09 DIAGNOSIS — Z9181 History of falling: Secondary | ICD-10-CM | POA: Diagnosis not present

## 2021-11-09 DIAGNOSIS — E7800 Pure hypercholesterolemia, unspecified: Secondary | ICD-10-CM | POA: Diagnosis not present

## 2021-11-09 DIAGNOSIS — M1990 Unspecified osteoarthritis, unspecified site: Secondary | ICD-10-CM | POA: Diagnosis not present

## 2021-11-09 DIAGNOSIS — M6281 Muscle weakness (generalized): Secondary | ICD-10-CM | POA: Diagnosis not present

## 2021-11-09 DIAGNOSIS — E876 Hypokalemia: Secondary | ICD-10-CM | POA: Diagnosis not present

## 2021-11-09 DIAGNOSIS — E039 Hypothyroidism, unspecified: Secondary | ICD-10-CM | POA: Diagnosis not present

## 2021-11-09 DIAGNOSIS — E1165 Type 2 diabetes mellitus with hyperglycemia: Secondary | ICD-10-CM | POA: Diagnosis not present

## 2021-11-09 DIAGNOSIS — K219 Gastro-esophageal reflux disease without esophagitis: Secondary | ICD-10-CM | POA: Diagnosis not present

## 2021-11-09 DIAGNOSIS — Z7982 Long term (current) use of aspirin: Secondary | ICD-10-CM | POA: Diagnosis not present

## 2021-11-09 DIAGNOSIS — I10 Essential (primary) hypertension: Secondary | ICD-10-CM | POA: Diagnosis not present

## 2021-11-13 DIAGNOSIS — R197 Diarrhea, unspecified: Secondary | ICD-10-CM | POA: Diagnosis not present

## 2021-11-13 DIAGNOSIS — F17210 Nicotine dependence, cigarettes, uncomplicated: Secondary | ICD-10-CM | POA: Diagnosis not present

## 2021-11-13 DIAGNOSIS — E1165 Type 2 diabetes mellitus with hyperglycemia: Secondary | ICD-10-CM | POA: Diagnosis not present

## 2021-11-13 DIAGNOSIS — E860 Dehydration: Secondary | ICD-10-CM | POA: Diagnosis not present

## 2021-11-13 DIAGNOSIS — I10 Essential (primary) hypertension: Secondary | ICD-10-CM | POA: Diagnosis not present

## 2021-11-13 DIAGNOSIS — M5416 Radiculopathy, lumbar region: Secondary | ICD-10-CM | POA: Diagnosis not present

## 2021-11-13 DIAGNOSIS — E1169 Type 2 diabetes mellitus with other specified complication: Secondary | ICD-10-CM | POA: Diagnosis not present

## 2021-11-13 DIAGNOSIS — E039 Hypothyroidism, unspecified: Secondary | ICD-10-CM | POA: Diagnosis not present

## 2021-11-14 DIAGNOSIS — I10 Essential (primary) hypertension: Secondary | ICD-10-CM | POA: Diagnosis not present

## 2021-11-14 DIAGNOSIS — Z955 Presence of coronary angioplasty implant and graft: Secondary | ICD-10-CM | POA: Diagnosis not present

## 2021-11-14 DIAGNOSIS — Z7982 Long term (current) use of aspirin: Secondary | ICD-10-CM | POA: Diagnosis not present

## 2021-11-14 DIAGNOSIS — E1110 Type 2 diabetes mellitus with ketoacidosis without coma: Secondary | ICD-10-CM | POA: Diagnosis not present

## 2021-11-14 DIAGNOSIS — E876 Hypokalemia: Secondary | ICD-10-CM | POA: Diagnosis not present

## 2021-11-14 DIAGNOSIS — J440 Chronic obstructive pulmonary disease with acute lower respiratory infection: Secondary | ICD-10-CM | POA: Diagnosis not present

## 2021-11-14 DIAGNOSIS — I471 Supraventricular tachycardia: Secondary | ICD-10-CM | POA: Diagnosis not present

## 2021-11-14 DIAGNOSIS — Z794 Long term (current) use of insulin: Secondary | ICD-10-CM | POA: Diagnosis not present

## 2021-11-14 DIAGNOSIS — B370 Candidal stomatitis: Secondary | ICD-10-CM | POA: Diagnosis not present

## 2021-11-14 DIAGNOSIS — Z87891 Personal history of nicotine dependence: Secondary | ICD-10-CM | POA: Diagnosis not present

## 2021-11-14 DIAGNOSIS — I2510 Atherosclerotic heart disease of native coronary artery without angina pectoris: Secondary | ICD-10-CM | POA: Diagnosis not present

## 2021-11-14 DIAGNOSIS — E1165 Type 2 diabetes mellitus with hyperglycemia: Secondary | ICD-10-CM | POA: Diagnosis not present

## 2021-11-14 DIAGNOSIS — Z86718 Personal history of other venous thrombosis and embolism: Secondary | ICD-10-CM | POA: Diagnosis not present

## 2021-11-14 DIAGNOSIS — E8809 Other disorders of plasma-protein metabolism, not elsewhere classified: Secondary | ICD-10-CM | POA: Diagnosis not present

## 2021-11-14 DIAGNOSIS — M6281 Muscle weakness (generalized): Secondary | ICD-10-CM | POA: Diagnosis not present

## 2021-11-14 DIAGNOSIS — Z9181 History of falling: Secondary | ICD-10-CM | POA: Diagnosis not present

## 2021-11-14 DIAGNOSIS — R262 Difficulty in walking, not elsewhere classified: Secondary | ICD-10-CM | POA: Diagnosis not present

## 2021-11-14 DIAGNOSIS — E7800 Pure hypercholesterolemia, unspecified: Secondary | ICD-10-CM | POA: Diagnosis not present

## 2021-11-14 DIAGNOSIS — K219 Gastro-esophageal reflux disease without esophagitis: Secondary | ICD-10-CM | POA: Diagnosis not present

## 2021-11-14 DIAGNOSIS — M1990 Unspecified osteoarthritis, unspecified site: Secondary | ICD-10-CM | POA: Diagnosis not present

## 2021-11-14 DIAGNOSIS — E039 Hypothyroidism, unspecified: Secondary | ICD-10-CM | POA: Diagnosis not present

## 2021-11-15 DIAGNOSIS — E1110 Type 2 diabetes mellitus with ketoacidosis without coma: Secondary | ICD-10-CM | POA: Diagnosis not present

## 2021-11-15 DIAGNOSIS — E1165 Type 2 diabetes mellitus with hyperglycemia: Secondary | ICD-10-CM | POA: Diagnosis not present

## 2021-11-16 ENCOUNTER — Ambulatory Visit: Payer: Medicare Other | Admitting: Nurse Practitioner

## 2021-11-16 DIAGNOSIS — E860 Dehydration: Secondary | ICD-10-CM | POA: Diagnosis not present

## 2021-11-16 DIAGNOSIS — I10 Essential (primary) hypertension: Secondary | ICD-10-CM | POA: Diagnosis not present

## 2021-11-16 DIAGNOSIS — E1169 Type 2 diabetes mellitus with other specified complication: Secondary | ICD-10-CM | POA: Diagnosis not present

## 2021-11-17 DIAGNOSIS — J029 Acute pharyngitis, unspecified: Secondary | ICD-10-CM | POA: Diagnosis not present

## 2021-11-17 DIAGNOSIS — Z23 Encounter for immunization: Secondary | ICD-10-CM | POA: Diagnosis not present

## 2021-11-17 DIAGNOSIS — Z79899 Other long term (current) drug therapy: Secondary | ICD-10-CM | POA: Diagnosis not present

## 2021-11-17 DIAGNOSIS — R059 Cough, unspecified: Secondary | ICD-10-CM | POA: Diagnosis not present

## 2021-11-17 DIAGNOSIS — I2510 Atherosclerotic heart disease of native coronary artery without angina pectoris: Secondary | ICD-10-CM | POA: Diagnosis not present

## 2021-11-17 DIAGNOSIS — U071 COVID-19: Secondary | ICD-10-CM | POA: Diagnosis not present

## 2021-11-17 DIAGNOSIS — Z87891 Personal history of nicotine dependence: Secondary | ICD-10-CM | POA: Diagnosis not present

## 2021-11-17 DIAGNOSIS — E079 Disorder of thyroid, unspecified: Secondary | ICD-10-CM | POA: Diagnosis not present

## 2021-11-17 DIAGNOSIS — I10 Essential (primary) hypertension: Secondary | ICD-10-CM | POA: Diagnosis not present

## 2021-11-17 DIAGNOSIS — R54 Age-related physical debility: Secondary | ICD-10-CM | POA: Insufficient documentation

## 2021-11-17 DIAGNOSIS — E119 Type 2 diabetes mellitus without complications: Secondary | ICD-10-CM | POA: Diagnosis not present

## 2021-11-17 DIAGNOSIS — N390 Urinary tract infection, site not specified: Secondary | ICD-10-CM | POA: Diagnosis not present

## 2021-11-17 DIAGNOSIS — I959 Hypotension, unspecified: Secondary | ICD-10-CM | POA: Diagnosis not present

## 2021-11-17 DIAGNOSIS — Z7902 Long term (current) use of antithrombotics/antiplatelets: Secondary | ICD-10-CM | POA: Diagnosis not present

## 2021-11-17 DIAGNOSIS — R0789 Other chest pain: Secondary | ICD-10-CM | POA: Diagnosis not present

## 2021-11-17 DIAGNOSIS — B951 Streptococcus, group B, as the cause of diseases classified elsewhere: Secondary | ICD-10-CM | POA: Diagnosis not present

## 2021-11-17 DIAGNOSIS — Z602 Problems related to living alone: Secondary | ICD-10-CM | POA: Diagnosis not present

## 2021-11-17 DIAGNOSIS — Z96651 Presence of right artificial knee joint: Secondary | ICD-10-CM | POA: Diagnosis not present

## 2021-11-17 DIAGNOSIS — R531 Weakness: Secondary | ICD-10-CM | POA: Diagnosis not present

## 2021-11-17 DIAGNOSIS — E785 Hyperlipidemia, unspecified: Secondary | ICD-10-CM | POA: Diagnosis not present

## 2021-11-17 DIAGNOSIS — Z794 Long term (current) use of insulin: Secondary | ICD-10-CM | POA: Diagnosis not present

## 2021-11-17 DIAGNOSIS — Z7984 Long term (current) use of oral hypoglycemic drugs: Secondary | ICD-10-CM | POA: Diagnosis not present

## 2021-11-17 DIAGNOSIS — Z7982 Long term (current) use of aspirin: Secondary | ICD-10-CM | POA: Diagnosis not present

## 2021-11-18 DIAGNOSIS — N390 Urinary tract infection, site not specified: Secondary | ICD-10-CM | POA: Diagnosis not present

## 2021-11-18 DIAGNOSIS — B954 Other streptococcus as the cause of diseases classified elsewhere: Secondary | ICD-10-CM | POA: Diagnosis not present

## 2021-11-18 DIAGNOSIS — E785 Hyperlipidemia, unspecified: Secondary | ICD-10-CM | POA: Diagnosis not present

## 2021-11-18 DIAGNOSIS — U071 COVID-19: Secondary | ICD-10-CM | POA: Diagnosis not present

## 2021-11-18 DIAGNOSIS — I2510 Atherosclerotic heart disease of native coronary artery without angina pectoris: Secondary | ICD-10-CM | POA: Diagnosis not present

## 2021-11-18 DIAGNOSIS — Z79899 Other long term (current) drug therapy: Secondary | ICD-10-CM | POA: Diagnosis not present

## 2021-11-18 DIAGNOSIS — R531 Weakness: Secondary | ICD-10-CM | POA: Diagnosis not present

## 2021-11-18 DIAGNOSIS — I10 Essential (primary) hypertension: Secondary | ICD-10-CM | POA: Diagnosis not present

## 2021-11-18 DIAGNOSIS — E119 Type 2 diabetes mellitus without complications: Secondary | ICD-10-CM | POA: Diagnosis not present

## 2021-11-19 DIAGNOSIS — I2510 Atherosclerotic heart disease of native coronary artery without angina pectoris: Secondary | ICD-10-CM | POA: Diagnosis not present

## 2021-11-19 DIAGNOSIS — R531 Weakness: Secondary | ICD-10-CM | POA: Diagnosis not present

## 2021-11-19 DIAGNOSIS — N390 Urinary tract infection, site not specified: Secondary | ICD-10-CM | POA: Diagnosis not present

## 2021-11-19 DIAGNOSIS — E785 Hyperlipidemia, unspecified: Secondary | ICD-10-CM | POA: Diagnosis not present

## 2021-11-19 DIAGNOSIS — E119 Type 2 diabetes mellitus without complications: Secondary | ICD-10-CM | POA: Diagnosis not present

## 2021-11-19 DIAGNOSIS — I10 Essential (primary) hypertension: Secondary | ICD-10-CM | POA: Diagnosis not present

## 2021-11-19 DIAGNOSIS — Z79899 Other long term (current) drug therapy: Secondary | ICD-10-CM | POA: Diagnosis not present

## 2021-11-19 DIAGNOSIS — U071 COVID-19: Secondary | ICD-10-CM | POA: Diagnosis not present

## 2021-11-19 DIAGNOSIS — B954 Other streptococcus as the cause of diseases classified elsewhere: Secondary | ICD-10-CM | POA: Diagnosis not present

## 2021-11-20 DIAGNOSIS — R531 Weakness: Secondary | ICD-10-CM | POA: Diagnosis not present

## 2021-11-20 DIAGNOSIS — U071 COVID-19: Secondary | ICD-10-CM | POA: Diagnosis not present

## 2021-11-20 DIAGNOSIS — I2510 Atherosclerotic heart disease of native coronary artery without angina pectoris: Secondary | ICD-10-CM | POA: Diagnosis not present

## 2021-11-20 DIAGNOSIS — E119 Type 2 diabetes mellitus without complications: Secondary | ICD-10-CM | POA: Diagnosis not present

## 2021-11-20 DIAGNOSIS — E785 Hyperlipidemia, unspecified: Secondary | ICD-10-CM | POA: Diagnosis not present

## 2021-11-20 DIAGNOSIS — I10 Essential (primary) hypertension: Secondary | ICD-10-CM | POA: Diagnosis not present

## 2021-11-22 DIAGNOSIS — Z794 Long term (current) use of insulin: Secondary | ICD-10-CM | POA: Diagnosis not present

## 2021-11-22 DIAGNOSIS — K219 Gastro-esophageal reflux disease without esophagitis: Secondary | ICD-10-CM | POA: Diagnosis not present

## 2021-11-22 DIAGNOSIS — E876 Hypokalemia: Secondary | ICD-10-CM | POA: Diagnosis not present

## 2021-11-22 DIAGNOSIS — E8809 Other disorders of plasma-protein metabolism, not elsewhere classified: Secondary | ICD-10-CM | POA: Diagnosis not present

## 2021-11-22 DIAGNOSIS — Z7982 Long term (current) use of aspirin: Secondary | ICD-10-CM | POA: Diagnosis not present

## 2021-11-22 DIAGNOSIS — Z955 Presence of coronary angioplasty implant and graft: Secondary | ICD-10-CM | POA: Diagnosis not present

## 2021-11-22 DIAGNOSIS — M1990 Unspecified osteoarthritis, unspecified site: Secondary | ICD-10-CM | POA: Diagnosis not present

## 2021-11-22 DIAGNOSIS — R262 Difficulty in walking, not elsewhere classified: Secondary | ICD-10-CM | POA: Diagnosis not present

## 2021-11-22 DIAGNOSIS — J440 Chronic obstructive pulmonary disease with acute lower respiratory infection: Secondary | ICD-10-CM | POA: Diagnosis not present

## 2021-11-22 DIAGNOSIS — Z86718 Personal history of other venous thrombosis and embolism: Secondary | ICD-10-CM | POA: Diagnosis not present

## 2021-11-22 DIAGNOSIS — I471 Supraventricular tachycardia: Secondary | ICD-10-CM | POA: Diagnosis not present

## 2021-11-22 DIAGNOSIS — Z9181 History of falling: Secondary | ICD-10-CM | POA: Diagnosis not present

## 2021-11-22 DIAGNOSIS — E7800 Pure hypercholesterolemia, unspecified: Secondary | ICD-10-CM | POA: Diagnosis not present

## 2021-11-22 DIAGNOSIS — E1165 Type 2 diabetes mellitus with hyperglycemia: Secondary | ICD-10-CM | POA: Diagnosis not present

## 2021-11-22 DIAGNOSIS — E1110 Type 2 diabetes mellitus with ketoacidosis without coma: Secondary | ICD-10-CM | POA: Diagnosis not present

## 2021-11-22 DIAGNOSIS — I10 Essential (primary) hypertension: Secondary | ICD-10-CM | POA: Diagnosis not present

## 2021-11-22 DIAGNOSIS — B370 Candidal stomatitis: Secondary | ICD-10-CM | POA: Diagnosis not present

## 2021-11-22 DIAGNOSIS — I2510 Atherosclerotic heart disease of native coronary artery without angina pectoris: Secondary | ICD-10-CM | POA: Diagnosis not present

## 2021-11-22 DIAGNOSIS — E039 Hypothyroidism, unspecified: Secondary | ICD-10-CM | POA: Diagnosis not present

## 2021-11-22 DIAGNOSIS — M6281 Muscle weakness (generalized): Secondary | ICD-10-CM | POA: Diagnosis not present

## 2021-11-22 DIAGNOSIS — Z87891 Personal history of nicotine dependence: Secondary | ICD-10-CM | POA: Diagnosis not present

## 2021-11-22 NOTE — Patient Instructions (Signed)
Keep up  the great job. Eat 30 g CHO at each meals Increase fresh fruits and vegetables and whole grains. Keep drinking water Avoid snacks unless blood sugar is running low. If BS is less than 90, don't take insulin with meal. Don't skip meals and eat meals on time.

## 2021-11-23 ENCOUNTER — Encounter: Payer: Self-pay | Admitting: Nutrition

## 2021-11-23 DIAGNOSIS — Z794 Long term (current) use of insulin: Secondary | ICD-10-CM | POA: Diagnosis not present

## 2021-11-23 DIAGNOSIS — I10 Essential (primary) hypertension: Secondary | ICD-10-CM | POA: Diagnosis not present

## 2021-11-23 DIAGNOSIS — Z9181 History of falling: Secondary | ICD-10-CM | POA: Diagnosis not present

## 2021-11-23 DIAGNOSIS — Z7982 Long term (current) use of aspirin: Secondary | ICD-10-CM | POA: Diagnosis not present

## 2021-11-23 DIAGNOSIS — B370 Candidal stomatitis: Secondary | ICD-10-CM | POA: Diagnosis not present

## 2021-11-23 DIAGNOSIS — E876 Hypokalemia: Secondary | ICD-10-CM | POA: Diagnosis not present

## 2021-11-23 DIAGNOSIS — Z87891 Personal history of nicotine dependence: Secondary | ICD-10-CM | POA: Diagnosis not present

## 2021-11-23 DIAGNOSIS — I2510 Atherosclerotic heart disease of native coronary artery without angina pectoris: Secondary | ICD-10-CM | POA: Diagnosis not present

## 2021-11-23 DIAGNOSIS — Z86718 Personal history of other venous thrombosis and embolism: Secondary | ICD-10-CM | POA: Diagnosis not present

## 2021-11-23 DIAGNOSIS — E1165 Type 2 diabetes mellitus with hyperglycemia: Secondary | ICD-10-CM | POA: Diagnosis not present

## 2021-11-23 DIAGNOSIS — R262 Difficulty in walking, not elsewhere classified: Secondary | ICD-10-CM | POA: Diagnosis not present

## 2021-11-23 DIAGNOSIS — E1110 Type 2 diabetes mellitus with ketoacidosis without coma: Secondary | ICD-10-CM | POA: Diagnosis not present

## 2021-11-23 DIAGNOSIS — E7800 Pure hypercholesterolemia, unspecified: Secondary | ICD-10-CM | POA: Diagnosis not present

## 2021-11-23 DIAGNOSIS — E039 Hypothyroidism, unspecified: Secondary | ICD-10-CM | POA: Diagnosis not present

## 2021-11-23 DIAGNOSIS — M6281 Muscle weakness (generalized): Secondary | ICD-10-CM | POA: Diagnosis not present

## 2021-11-23 DIAGNOSIS — K219 Gastro-esophageal reflux disease without esophagitis: Secondary | ICD-10-CM | POA: Diagnosis not present

## 2021-11-23 DIAGNOSIS — M1990 Unspecified osteoarthritis, unspecified site: Secondary | ICD-10-CM | POA: Diagnosis not present

## 2021-11-23 DIAGNOSIS — I471 Supraventricular tachycardia: Secondary | ICD-10-CM | POA: Diagnosis not present

## 2021-11-23 DIAGNOSIS — Z955 Presence of coronary angioplasty implant and graft: Secondary | ICD-10-CM | POA: Diagnosis not present

## 2021-11-23 DIAGNOSIS — E8809 Other disorders of plasma-protein metabolism, not elsewhere classified: Secondary | ICD-10-CM | POA: Diagnosis not present

## 2021-11-23 DIAGNOSIS — J440 Chronic obstructive pulmonary disease with acute lower respiratory infection: Secondary | ICD-10-CM | POA: Diagnosis not present

## 2021-12-01 DIAGNOSIS — R3589 Other polyuria: Secondary | ICD-10-CM | POA: Diagnosis not present

## 2021-12-01 DIAGNOSIS — N3001 Acute cystitis with hematuria: Secondary | ICD-10-CM | POA: Diagnosis not present

## 2021-12-07 ENCOUNTER — Ambulatory Visit: Payer: Medicare Other | Admitting: Nutrition

## 2021-12-08 ENCOUNTER — Encounter (HOSPITAL_COMMUNITY): Payer: Self-pay

## 2021-12-08 ENCOUNTER — Emergency Department (HOSPITAL_COMMUNITY): Payer: HMO

## 2021-12-08 ENCOUNTER — Emergency Department (HOSPITAL_COMMUNITY)
Admission: EM | Admit: 2021-12-08 | Discharge: 2021-12-08 | Disposition: A | Discharge status: Home / Self Care | Payer: HMO | Attending: Emergency Medicine | Admitting: Emergency Medicine

## 2021-12-08 ENCOUNTER — Other Ambulatory Visit: Payer: Self-pay

## 2021-12-08 DIAGNOSIS — I10 Essential (primary) hypertension: Secondary | ICD-10-CM | POA: Insufficient documentation

## 2021-12-08 DIAGNOSIS — Z79899 Other long term (current) drug therapy: Secondary | ICD-10-CM | POA: Insufficient documentation

## 2021-12-08 DIAGNOSIS — E119 Type 2 diabetes mellitus without complications: Secondary | ICD-10-CM | POA: Insufficient documentation

## 2021-12-08 DIAGNOSIS — Z7984 Long term (current) use of oral hypoglycemic drugs: Secondary | ICD-10-CM | POA: Insufficient documentation

## 2021-12-08 DIAGNOSIS — R5383 Other fatigue: Secondary | ICD-10-CM | POA: Insufficient documentation

## 2021-12-08 DIAGNOSIS — Z7982 Long term (current) use of aspirin: Secondary | ICD-10-CM | POA: Insufficient documentation

## 2021-12-08 DIAGNOSIS — Z794 Long term (current) use of insulin: Secondary | ICD-10-CM | POA: Insufficient documentation

## 2021-12-08 DIAGNOSIS — Z8616 Personal history of COVID-19: Secondary | ICD-10-CM | POA: Diagnosis not present

## 2021-12-08 DIAGNOSIS — R531 Weakness: Secondary | ICD-10-CM | POA: Diagnosis not present

## 2021-12-08 LAB — CBC WITH DIFFERENTIAL/PLATELET
Abs Immature Granulocytes: 0.03 10*3/uL (ref 0.00–0.07)
Basophils Absolute: 0.1 10*3/uL (ref 0.0–0.1)
Basophils Relative: 1 %
Eosinophils Absolute: 0.3 10*3/uL (ref 0.0–0.5)
Eosinophils Relative: 4 %
HCT: 37.6 % (ref 36.0–46.0)
Hemoglobin: 12.8 g/dL (ref 12.0–15.0)
Immature Granulocytes: 1 %
Lymphocytes Relative: 19 %
Lymphs Abs: 1.3 10*3/uL (ref 0.7–4.0)
MCH: 32.6 pg (ref 26.0–34.0)
MCHC: 34 g/dL (ref 30.0–36.0)
MCV: 95.7 fL (ref 80.0–100.0)
Monocytes Absolute: 0.8 10*3/uL (ref 0.1–1.0)
Monocytes Relative: 12 %
Neutro Abs: 4.2 10*3/uL (ref 1.7–7.7)
Neutrophils Relative %: 63 %
Platelets: 190 10*3/uL (ref 150–400)
RBC: 3.93 MIL/uL (ref 3.87–5.11)
RDW: 14.4 % (ref 11.5–15.5)
WBC: 6.6 10*3/uL (ref 4.0–10.5)
nRBC: 0 % (ref 0.0–0.2)

## 2021-12-08 LAB — COMPREHENSIVE METABOLIC PANEL
ALT: 16 U/L (ref 0–44)
AST: 15 U/L (ref 15–41)
Albumin: 3.8 g/dL (ref 3.5–5.0)
Alkaline Phosphatase: 75 U/L (ref 38–126)
Anion gap: 11 (ref 5–15)
BUN: 21 mg/dL (ref 8–23)
CO2: 23 mmol/L (ref 22–32)
Calcium: 10.2 mg/dL (ref 8.9–10.3)
Chloride: 97 mmol/L — ABNORMAL LOW (ref 98–111)
Creatinine, Ser: 0.71 mg/dL (ref 0.44–1.00)
GFR, Estimated: >60 mL/min (ref >60)
Glucose, Bld: 268 mg/dL — ABNORMAL HIGH (ref 70–99)
Potassium: 4.4 mmol/L (ref 3.5–5.1)
Sodium: 131 mmol/L — ABNORMAL LOW (ref 135–145)
Total Bilirubin: 0.6 mg/dL (ref 0.3–1.2)
Total Protein: 7.3 g/dL (ref 6.5–8.1)

## 2021-12-08 LAB — URINALYSIS, ROUTINE W REFLEX MICROSCOPIC
Bilirubin Urine: NEGATIVE
Glucose, UA: NEGATIVE mg/dL
Hgb urine dipstick: NEGATIVE
Ketones, ur: 5 mg/dL — AB
Leukocytes,Ua: NEGATIVE
Nitrite: NEGATIVE
Protein, ur: NEGATIVE mg/dL
Specific Gravity, Urine: 1.009 (ref 1.005–1.030)
pH: 6 (ref 5.0–8.0)

## 2021-12-08 LAB — AMMONIA: Ammonia: 10 umol/L (ref 9–35)

## 2021-12-08 LAB — LACTIC ACID, PLASMA: Lactic Acid, Venous: 1.6 mmol/L (ref 0.5–1.9)

## 2021-12-08 MED ORDER — SODIUM CHLORIDE 0.9 % IV BOLUS
500.0000 mL | Freq: Once | INTRAVENOUS | Status: AC
Start: 2021-12-08 — End: 2021-12-08
  Administered 2021-12-08: 500 mL via INTRAVENOUS

## 2021-12-08 NOTE — ED Notes (Signed)
Pt given a walker to use, ambulated to the restroom.

## 2021-12-08 NOTE — ED Provider Notes (Signed)
[facility] Provider Note   CSN: [id number] Arrival date & time: 12/08/21  1626     History  Chief Complaint  Patient presents with   Weakness    Gabrielle Wood is a 76 y.o. female with a history significant for diabetes with a history of DKA, GERD, hypertension presenting for evaluation of generalized fatigue and weakness.  She has had multiple illnesses over the past 2 months, she was diagnosed with pneumonia in November, followed by diagnosis of COVID for which she was admitted at [facility] just before Christmas, still with mild cough but no persistent shortness of breath.  She was diagnosed with a UTI on `12/30 with dysuria sx.  She is currently taking Macrobid for this.  She has had worsening generalized weakness.  Grandson at the bedside states that she was very confused 2 days ago, but this cleared yesterday, she was alert and ambulatory, then woke today with increased drowsiness and generalized weakness but has had no further confusion.  She reports feeling lightheaded when she tried to get out of bed this morning.  She denies fevers or chills nausea or vomiting, also no chest pain, shortness of breath or abdominal pain.  She denies headache, neck pain or stiffness, denies focal weakness.  She continues to have dysuria but states it is better since starting her antibiotics.  The history is provided by the patient.      Home Medications Prior to Admission medications   Medication Sig Start Date End Date Taking? Authorizing Provider  ALPRAZolam Duanne Moron) 1 MG tablet Take 0.5-1 mg by mouth. 0.5 tablet in am and 0.5 tab at night 04/11/21  Yes [provider]  amLODipine (NORVASC) 5 MG tablet Take 5 mg by mouth daily. 04/20/21  Yes [provider]  aspirin EC 81 MG tablet Take 81 mg by mouth daily. Swallow whole.   Yes [provider]  atorvastatin (LIPITOR) 80 MG tablet Take 80 mg by mouth every evening. 11/24/21  Yes [provider]  gabapentin (NEURONTIN) 300 MG capsule Take 300 mg by mouth 3 (three) times daily. 07/29/21  Yes [provider]  insulin lispro (HUMALOG) 100 UNIT/ML KwikPen Inject 2-5 Units into the skin 3 (three) times daily. 07/20/21  Yes [provider]  levocetirizine (XYZAL) 5 MG tablet Take 1 tablet (5 mg total) by mouth every evening. 04/13/21  Yes Noemi Chapel, MD  levothyroxine (SYNTHROID) 125 MCG tablet Take 1 tablet (125 mcg total) by mouth daily. 08/17/21  Yes Brita Romp, NP  loperamide (IMODIUM) 2 MG capsule Take 2 mg by mouth 4 (four) times daily as needed. 06/28/21  Yes [provider]  magnesium oxide (MAG-OX) 400 MG tablet Take by mouth. 11/21/21 11/21/22 Yes [provider]  metFORMIN (GLUCOPHAGE) 500 MG tablet Take 1 tablet (500 mg total) by mouth 2 (two) times daily with a meal. 2 tabs bid 11/01/21  Yes Brita Romp, NP  metoprolol succinate (TOPROL-XL) 50 MG 24 hr tablet Take 1 tablet (50 mg total) by mouth every evening. Take with or immediately following a meal. 10/17/21 12/08/21 Yes Shahmehdi, Seyed A, MD  nitrofurantoin, macrocrystal-monohydrate, (MACROBID) 100 MG capsule Take 100 mg by mouth 2 (two) times daily. 12/01/21 12/11/21 Yes [provider]  nitroGLYCERIN (NITROSTAT) 0.4 MG SL tablet Place 0.4 mg under the tongue every 5 (five) minutes as needed for chest pain.   Yes [provider]  ondansetron (ZOFRAN-ODT) 4 MG disintegrating tablet Take 4 mg by mouth  every 8 (eight) hours as needed. 06/27/21  Yes [provider]  pantoprazole (PROTONIX) 40 MG tablet Take 40 mg by mouth daily. 06/06/21  Yes [provider]  ticagrelor (BRILINTA) 90 MG TABS tablet Take 90 mg by mouth 2 (two) times daily.   Yes [provider]  TRESIBA FLEXTOUCH 100 UNIT/ML FlexTouch Pen Inject 20 Units into the skin at bedtime. 07/20/21  Yes [provider]  valsartan (DIOVAN) 80 MG tablet Take 80 mg by  mouth daily.   Yes [provider]  vitamin C (ASCORBIC ACID) 500 MG tablet Take 500 mg by mouth daily.   Yes [provider]  fexofenadine (ALLEGRA) 60 MG tablet Take by mouth.  04/13/21  [provider]      Allergies    Ciprocin-fluocin-procin [fluocinolone], Lipitor [atorvastatin], Lisinopril, and Red yeast rice [cholestin]    Review of Systems   Review of Systems  Constitutional:  Positive for chills and fatigue. Negative for appetite change and fever.  HENT:  Negative for congestion and sore throat.   Eyes: Negative.   Respiratory:  Negative for chest tightness and shortness of breath.   Cardiovascular:  Negative for chest pain.  Gastrointestinal:  Negative for abdominal pain, diarrhea, nausea and vomiting.  Genitourinary:  Positive for dysuria.  Musculoskeletal:  Negative for arthralgias, joint swelling and neck pain.  Skin: Negative.  Negative for rash and wound.  Neurological:  Positive for weakness and light-headedness. Negative for numbness and headaches.  Psychiatric/Behavioral: Negative.    All other systems reviewed and are negative.  Physical Exam Updated Vital Signs BP (!) 165/94    Pulse 90    Temp 98.2 F (36.8 C) (Oral)    Resp (!) 22    Ht 5\' 6"  (1.676 m)    Wt 56.4 kg    SpO2 96%    BMI 20.06 kg/m  Physical Exam Vitals and nursing note reviewed.  Constitutional:      Appearance: She is well-developed.  HENT:     Head: Normocephalic and atraumatic.  Eyes:     Conjunctiva/sclera: Conjunctivae normal.  Cardiovascular:     Rate and Rhythm: Normal rate and regular rhythm.     Heart sounds: Normal heart sounds.  Pulmonary:     Effort: Pulmonary effort is normal.     Breath sounds: Normal breath sounds. No wheezing.  Abdominal:     General: Bowel sounds are normal.     Palpations: Abdomen is soft.     Tenderness: There is no abdominal tenderness.  Musculoskeletal:        General: Normal range of motion.     Cervical back: Normal  range of motion.  Skin:    General: Skin is warm and dry.  Neurological:     Mental Status: She is alert.    ED Results / Procedures / Treatments   Labs (all labs ordered are listed, but only abnormal results are displayed) Labs Reviewed  COMPREHENSIVE METABOLIC PANEL - Abnormal; Notable for the following components:      Result Value   Sodium 131 (*)    Chloride 97 (*)    Glucose, Bld 268 (*)    All other components within normal limits  URINALYSIS, ROUTINE W REFLEX MICROSCOPIC - Abnormal; Notable for the following components:   Ketones, ur 5 (*)    All other components within normal limits  CULTURE, BLOOD (ROUTINE X 2)  CULTURE, BLOOD (ROUTINE X 2)  CBC WITH DIFFERENTIAL/PLATELET  LACTIC ACID, PLASMA  AMMONIA  EKG ED ECG REPORT   Date: 12/09/2021  Rate: 78  Rhythm: normal sinus rhythm  QRS Axis: left  Intervals: normal  ST/T Wave abnormalities: normal  Conduction Disutrbances:left anterior fascicular block and nonspecific intraventricular conduction delay  Narrative Interpretation:   Old EKG Reviewed: unchanged from 10/16/21  I have personally reviewed the EKG tracing and agree with the computerized printout as noted.   Radiology CT Head Wo Contrast  Result Date: 12/08/2021 CLINICAL DATA:  Mental status change, unknown cause EXAM: CT HEAD WITHOUT CONTRAST TECHNIQUE: Contiguous axial images were obtained from the base of the skull through the vertex without intravenous contrast. COMPARISON:  None. BRAIN: BRAIN Cerebral ventricle sizes are concordant with the degree of cerebral volume loss. Patchy and confluent areas of decreased attenuation are noted throughout the deep and periventricular white matter of the cerebral hemispheres bilaterally, compatible with chronic microvascular ischemic disease. No evidence of large-territorial acute infarction. No parenchymal hemorrhage. No mass lesion. No extra-axial collection. No mass effect or midline shift. No hydrocephalus.  Basilar cisterns are patent. Vascular: No hyperdense vessel. Atherosclerotic calcifications are present within the cavernous internal carotid arteries. Skull: No acute fracture or focal lesion. Sinuses/Orbits: Paranasal sinuses and mastoid air cells are clear. Bilateral lens replacement. Otherwise the orbits are unremarkable. Other: None. IMPRESSION: No acute intracranial abnormality. Electronically Signed   By: Iven Finn M.D.   On: 12/08/2021 22:09   DG Chest Port 1 View  Result Date: 12/08/2021 CLINICAL DATA:  Progressive weakness. EXAM: PORTABLE CHEST 1 VIEW COMPARISON:  Chest radiograph 11/17/2021.  Chest CT 10/14/2021 FINDINGS: Mild chronic hyperinflation. Mild lingular scarring at site of prior space disease. No residual consolidation. No new consolidation. The heart is normal in size. Normal mediastinal contours with aortic atherosclerosis. No pleural effusion, pulmonary edema, or pneumothorax. No acute osseous abnormalities are seen. IMPRESSION: Mild chronic hyperinflation. Lingular scarring at site of prior space disease. No acute chest finding. Electronically Signed   By: Keith Rake M.D.   On: 12/08/2021 18:48    Procedures Procedures    Medications Ordered in ED Medications  sodium chloride 0.9 % bolus 500 mL (0 mLs Intravenous Stopped 12/08/21 1831)    ED Course/ Medical Decision Making/ A&P                           Medical Decision Making  This patient presents to the ED for chief complaint of generalized weakness in the setting of a current UTI being treated with Macrobid and recent COVID-19., this involves an extensive number of treatment options. The differential diagnosis includes sepsis, untreated UTI, electrolyte abnormalities, dehydration, DKA.  Co morbidities that complicate the patient evaluation  Diabetes, known UTI, recent COVID-19  Additional history obtained:  Additional history obtained from prior chart and grandson at bedside  Lab Tests:  I  Ordered, and personally interpreted labs.  The pertinent results include: Urinalysis which was negative for acute UTI.  She is still taking Macrobid.  Advised to complete this course.  She does have a mild hyponatremia at 131, which is not significant to cause significant generalized weakness.  She was given an IV bolus of normal saline which should have helped to improve this condition.  He also has hyperglycemia with a glucose of 268, her CO2 is normal and her anion gap is 11, ruling out DKA.  Imaging Studies ordered:  I ordered imaging studies including chest x-ray negative for ongoing infection.  CT head also obtained and negative  for acute process, specifically subdural, CVA. I agree with the radiologist interpretation  Cardiac Monitoring:  The patient was maintained on a cardiac monitor.  I personally viewed and interpreted the cardiac monitored which showed an underlying rhythm of: NSR, unchanged from 10/16/21  Medicines ordered and prescription drug management:  I ordered medication including none except for IVF  Reevaluation of the patient after these medicines showed that the patient improved I have reviewed the patients home medicines and have made adjustments as needed  Additional tests Considered:  No additional testing indicated  Critical Interventions:  N/a  Consultations Obtained:  N/a  Reevaluation:  After the interventions noted above, I reevaluated the patient and found that they have :stayed the same  Social Determinants of Health:  N./a  Disposition:  After consideration of the diagnostic results and the patients response to treatment, I feel that the most appropriate treatment course includes dc home with close f/u with pcp.  Labs and imaging are stable today, she is alert and oriented here without any concern for altered mental status.  She was encouraged to complete her Macrobid course.  She was given return precautions however.  She was discharged home  in stable condition in the care of her grandson.  Patient was seen by Dr. Roderic Palau prior to discharge home.         Final Clinical Impression(s) / ED Diagnoses Final diagnoses:  Generalized weakness    Rx / DC Orders ED Discharge Orders     None         Landis Martins 12/09/21 1259    Milton Ferguson, MD 12/10/21 [phone]

## 2021-12-08 NOTE — Discharge Instructions (Signed)
Your labs, EKG, imaging including your CT scan are all reassuring.  It appears that your urinary infection is responding to the antibiotics make sure you complete the course of this medication.  Rest make sure you are drinking plenty of fluids.  Plan to see your doctor for recheck early next week.  Return here if your symptoms worsen in any way prior to you being able to follow-up with Dr. Jimmye Norman.

## 2021-12-08 NOTE — ED Notes (Signed)
Pt ambulated to the restroom with moderate assistance.

## 2021-12-08 NOTE — ED Triage Notes (Signed)
Pt presents to ED with complaints of generalized weakness worsening over the past couple of months. Pt is currently being treated for UTI and has been nonambulatory. Pt also has had waxing/waning of mental status over the past week. Yolanda Bonine states she has seemed "spaced out" at times. Pt sent by PCP for evaluation.

## 2021-12-08 NOTE — ED Notes (Signed)
Pt given meal tray.

## 2021-12-13 LAB — CULTURE, BLOOD (ROUTINE X 2)
Culture: NO GROWTH
Culture: NO GROWTH
Special Requests: ADEQUATE

## 2021-12-28 ENCOUNTER — Encounter: Payer: Self-pay | Admitting: Internal Medicine

## 2022-01-03 ENCOUNTER — Encounter: Payer: Self-pay | Admitting: Internal Medicine

## 2022-01-22 ENCOUNTER — Other Ambulatory Visit: Payer: Self-pay | Admitting: Nurse Practitioner

## 2022-01-30 LAB — TSH: TSH: 1.68 (ref 0.41–5.90)

## 2022-01-30 LAB — BASIC METABOLIC PANEL
BUN: 16 (ref 4–21)
Creatinine: 0.7 (ref 0.5–1.1)

## 2022-01-30 LAB — HEMOGLOBIN A1C: Hemoglobin A1C: 9.3

## 2022-01-30 LAB — COMPREHENSIVE METABOLIC PANEL: Calcium: 10.5 (ref 8.7–10.7)

## 2022-01-31 ENCOUNTER — Other Ambulatory Visit: Payer: Self-pay

## 2022-01-31 ENCOUNTER — Encounter: Payer: Self-pay | Admitting: "Endocrinology

## 2022-01-31 ENCOUNTER — Encounter: Payer: HMO | Attending: Nurse Practitioner | Admitting: Nutrition

## 2022-01-31 ENCOUNTER — Encounter: Payer: Self-pay | Admitting: Nutrition

## 2022-01-31 ENCOUNTER — Ambulatory Visit: Payer: HMO | Admitting: "Endocrinology

## 2022-01-31 VITALS — BP 135/82 | HR 67 | Ht 67.0 in | Wt 128.4 lb

## 2022-01-31 VITALS — Wt 128.0 lb

## 2022-01-31 DIAGNOSIS — Z794 Long term (current) use of insulin: Secondary | ICD-10-CM | POA: Insufficient documentation

## 2022-01-31 DIAGNOSIS — E559 Vitamin D deficiency, unspecified: Secondary | ICD-10-CM

## 2022-01-31 DIAGNOSIS — E11649 Type 2 diabetes mellitus with hypoglycemia without coma: Secondary | ICD-10-CM

## 2022-01-31 DIAGNOSIS — E782 Mixed hyperlipidemia: Secondary | ICD-10-CM | POA: Diagnosis present

## 2022-01-31 DIAGNOSIS — E0810 Diabetes mellitus due to underlying condition with ketoacidosis without coma: Secondary | ICD-10-CM | POA: Diagnosis present

## 2022-01-31 DIAGNOSIS — E039 Hypothyroidism, unspecified: Secondary | ICD-10-CM

## 2022-01-31 NOTE — Progress Notes (Signed)
Endocrinology Follow Up Note       01/31/2022, 8:30 PM   Subjective:    Patient ID: Gabrielle Wood, female    DOB: 22-Sep-1946.  Gabrielle Wood is being seen in follow up after being seen in consultation for management of currently uncontrolled symptomatic diabetes requested by  Weirton Nation, MD.   Past Medical History:  Diagnosis Date   Arthritis    COPD (chronic obstructive pulmonary disease) ([place])    Diabetes mellitus    DVT (deep venous thrombosis) ([place])    years ago   GERD (gastroesophageal reflux disease)    H. pylori infection 2013   S/p treatment with amoxicillin, Biaxin, PPI   High cholesterol    Hypertension    Hypothyroidism    NSTEMI (non-ST elevated myocardial infarction) ([place]) 12/2020   s/p  PCI DES to the LCx   Thyroid disease     Past Surgical History:  Procedure Laterality Date   BACK SURGERY     lumbar x3    ESOPHAGOGASTRODUODENOSCOPY  04/04/2012   Moderate gastritis/MODERATE Duodenitis in the duodenal bulb duodenum.  Pathology with chronic H. pylori gastritis.   EUS  05/14/2012   Normal pancreas, gallbladder,biliary tree/ Incidental finding of simple-appearing right renal cyst.   TONSILLECTOMY     TUBAL LIGATION     VEIN SURGERY     stripping years ago but recent laser surgery; bilateral legs    Social History   Socioeconomic History   Marital status: Widowed    Spouse name: Not on file   Number of children: 2   Years of education: Not on file   Highest education level: Not on file  Occupational History   Occupation: retired, Secretary/administrator at [employer]  Tobacco Use   Smoking status: Former    Packs/day: 0.50    Types: Cigarettes   Smokeless tobacco: Never  Vaping Use   Vaping Use: Never used  Substance and Sexual Activity   Alcohol use: No   Drug use: No   Sexual activity: Not on file  Other Topics Concern   Not on file  Social History Narrative   Not on file    Social Determinants of Health   Financial Resource Strain: Not on file  Food Insecurity: Not on file  Transportation Needs: Not on file  Physical Activity: Not on file  Stress: Not on file  Social Connections: Not on file    Family History  Problem Relation Age of Onset   Pancreatic cancer Mother        diagnosed at age 71s, died from heart attack   Inflammatory bowel disease Brother        crohn's, his dgt deceased from colon cancer at age 61   Colon cancer Neg Hx     Outpatient Encounter Medications as of 01/31/2022  Medication Sig   ALPRAZolam (XANAX) 1 MG tablet Take 0.5-1 mg by mouth. 0.5 tablet in am and 0.5 tab at night   amLODipine (NORVASC) 5 MG tablet Take 5 mg by mouth daily.   aspirin EC 81 MG tablet Take 81 mg by mouth daily. Swallow whole.   atorvastatin (LIPITOR) 80 MG tablet Take 80 mg by mouth  every evening.   gabapentin (NEURONTIN) 300 MG capsule Take 300 mg by mouth 3 (three) times daily.   insulin lispro (HUMALOG) 100 UNIT/ML KwikPen Inject 5-8 Units into the skin 3 (three) times daily.   levocetirizine (XYZAL) 5 MG tablet Take 1 tablet (5 mg total) by mouth every evening.   levothyroxine (SYNTHROID) 125 MCG tablet TAKE ONE TABLET BY MOUTH DAILY   loperamide (IMODIUM) 2 MG capsule Take 2 mg by mouth 4 (four) times daily as needed.   magnesium oxide (MAG-OX) 400 MG tablet Take by mouth.   metFORMIN (GLUCOPHAGE) 500 MG tablet Take 1 tablet (500 mg total) by mouth 2 (two) times daily with a meal. 2 tabs bid   metoprolol succinate (TOPROL-XL) 50 MG 24 hr tablet Take 1 tablet (50 mg total) by mouth every evening. Take with or immediately following a meal.   nitroGLYCERIN (NITROSTAT) 0.4 MG SL tablet Place 0.4 mg under the tongue every 5 (five) minutes as needed for chest pain.   ondansetron (ZOFRAN-ODT) 4 MG disintegrating tablet Take 4 mg by mouth every 8 (eight) hours as needed.   pantoprazole (PROTONIX) 40 MG tablet Take 40 mg by mouth daily.   ticagrelor  (BRILINTA) 90 MG TABS tablet Take 90 mg by mouth 2 (two) times daily.   TRESIBA FLEXTOUCH 100 UNIT/ML FlexTouch Pen Inject 30 Units into the skin at bedtime.   valsartan (DIOVAN) 80 MG tablet Take 40 mg by mouth daily.   vitamin C (ASCORBIC ACID) 500 MG tablet Take 500 mg by mouth daily.   [DISCONTINUED] fexofenadine (ALLEGRA) 60 MG tablet Take by mouth.   No facility-administered encounter medications on file as of 01/31/2022.    ALLERGIES: Allergies  Allergen Reactions   Ciprocin-Fluocin-Procin [Fluocinolone]     Nightmares   Lipitor [Atorvastatin] Other (See Comments)    Muscle aches, pain   Lisinopril Cough   Red Yeast Rice [Cholestin] Itching and Rash    VACCINATION STATUS: Immunization History  Administered Date(s) Administered   Pneumococcal Polysaccharide-23 04/04/2012    Diabetes She presents for her follow-up diabetic visit. She has type 2 diabetes mellitus. Onset time: Was diagnosed at approx age of 28. Her disease course has been worsening. Pertinent negatives for hypoglycemia include no sweats or tremors. Associated symptoms include fatigue and foot paresthesias. Pertinent negatives for diabetes include no polydipsia, no polyuria and no weight loss. There are no hypoglycemic complications. Symptoms are worsening. Diabetic complications include heart disease and peripheral neuropathy. (Had a MI in the past, recently hospitalized for DKA) Risk factors for coronary artery disease include diabetes mellitus, dyslipidemia, hypertension, post-menopausal and sedentary lifestyle. Current diabetic treatment includes oral agent (monotherapy) and intensive insulin program. She is compliant with treatment most of the time. Her weight is decreasing steadily. She is following a generally healthy diet. When asked about meal planning, she reported none. She has not had a previous visit with a dietitian (Has appt with Kieth Brightly after me today). She participates in exercise intermittently. Her home  blood glucose trend is decreasing steadily. Her breakfast blood glucose range is generally >200 mg/dl. Her lunch blood glucose range is generally >200 mg/dl. Her dinner blood glucose range is generally >200 mg/dl. Her bedtime blood glucose range is generally >200 mg/dl. Her overall blood glucose range is >200 mg/dl. (She presents today, accompanied by her sister, with her  CGM. AGP shows severe persistent hyperglycemia. 25% TIR, 75% above target, no hypoglycemia.   She still snacks randomly  in between meals which may elevate her glucose  readings before meals. ) An ACE inhibitor/angiotensin II receptor blocker is being taken. She does not see a podiatrist.Eye exam is current.  Hypertension This is a chronic problem. The current episode started more than 1 year ago. The problem has been resolved since onset. The problem is controlled. Pertinent negatives include no sweats. Agents associated with hypertension include thyroid hormones. Risk factors for coronary artery disease include diabetes mellitus, dyslipidemia, post-menopausal state and sedentary lifestyle. Past treatments include angiotensin blockers, calcium channel blockers and beta blockers. The current treatment provides significant improvement. There are no compliance problems.  Hypertensive end-organ damage includes CAD/MI. Identifiable causes of hypertension include a thyroid problem.    Review of systems  Constitutional: + stable body weight,  current Body mass index is 20.11 kg/m. , + fatigue- deconditioned, no subjective hyperthermia, no subjective hypothermia Eyes: no blurry vision, no xerophthalmia ENT: no sore throat, no nodules palpated in throat, no dysphagia/odynophagia, no hoarseness Cardiovascular: no chest pain, no shortness of breath, no palpitations, no leg swelling Respiratory: no cough, no shortness of breath Gastrointestinal: no nausea/vomiting/diarrhea Musculoskeletal: no muscle/joint aches Skin: no rashes, no  hyperemia Neurological: no tremors, + numbness/tingling to BLE, no dizziness Psychiatric: no depression, no anxiety  Objective:     BP 135/82    Pulse 67    Ht 5\' 7"  (1.702 m)    Wt 128 lb 6.4 oz (58.2 kg)    BMI 20.11 kg/m   Wt Readings from Last 3 Encounters:  01/31/22 128 lb (58.1 kg)  01/31/22 128 lb 6.4 oz (58.2 kg)  12/08/21 124 lb 4.8 oz (56.4 kg)     BP Readings from Last 3 Encounters:  01/31/22 135/82  12/08/21 (!) 165/94  11/01/21 124/78      Physical Exam- Limited  Constitutional:  Body mass index is 20.11 kg/m. , not in acute distress, normal state of mind Eyes:  EOMI, no exophthalmos Neck: Supple Cardiovascular: RRR, no murmurs, rubs, or gallops, no edema Respiratory: Adequate breathing efforts, no crackles, rales, rhonchi, or wheezing Musculoskeletal: no gross deformities, strength intact in all four extremities, no gross restriction of joint movements, walks with cane Skin:  no rashes, no hyperemia Neurological: no tremor with outstretched hands      CMP ( most recent) CMP     Component Value Date/Time   NA 131 (L) 12/08/2021 1800   NA 139 04/25/2021 0000   K 4.4 12/08/2021 1800   CL 97 (L) 12/08/2021 1800   CO2 23 12/08/2021 1800   GLUCOSE 268 (H) 12/08/2021 1800   BUN 16 01/30/2022 0000   CREATININE 0.7 01/30/2022 0000   CREATININE 0.71 12/08/2021 1800   CREATININE 0.78 09/20/2016 0923   CALCIUM 10.5 01/30/2022 0000   PROT 7.3 12/08/2021 1800   PROT 6.7 07/17/2017 0912   ALBUMIN 3.8 12/08/2021 1800   ALBUMIN 4.4 07/17/2017 0912   AST 15 12/08/2021 1800   ALT 16 12/08/2021 1800   ALKPHOS 75 12/08/2021 1800   BILITOT 0.6 12/08/2021 1800   BILITOT 0.5 07/17/2017 0912   GFRNONAA >60 12/08/2021 1800   GFRNONAA 77 09/20/2016 0923   GFRAA >60 08/21/2019 1539   GFRAA 88 09/20/2016 0923     Diabetic Labs (most recent): Lab Results  Component Value Date   HGBA1C 9.3 01/30/2022   HGBA1C 8.9 08/15/2021   HGBA1C 9.6 04/25/2021      Lab  Results  Component Value Date   TSH 1.68 01/30/2022   TSH 1.62 07/24/2021   TSH 11.00 (A) 04/25/2021  TSH 19.240 (H) 07/17/2017   TSH 1.91 09/20/2016   TSH 3.05 06/14/2016   TSH 2.86 03/12/2016   TSH 4.280 04/04/2012   FREET4 1.53 07/17/2017   FREET4 1.5 09/20/2016   FREET4 1.6 06/14/2016   FREET4 1.5 03/12/2016     Assessment & Plan:   1) Uncontrolled type 2 diabetes mellitus with circulatory disorder, with long-term current use of insulin ([place])  - Rosalynn Molyneaux Gockel has currently uncontrolled symptomatic type 2 DM since 76 years of age.  She presents today, accompanied by her sister, with her  CGM. AGP shows severe persistent hyperglycemia. 25% TIR, 75% above target, no hypoglycemia.   She still snacks randomly  in between meals which may elevate her glucose readings before meals.   -Recent labs reviewed.  - I had a long discussion with her about the progressive nature of diabetes and the pathology behind its complications. 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 These are all discussed in detail with her.  - Nutritional counseling repeated at each appointment due to patients tendency to fall back in to old habits.  - The patient acknowledges there is a room for improvement in their diet and drink choices. -  Suggestion is made for the patient to avoid simple carbohydrates from their diet including Cakes, Sweet Desserts / Pastries, Ice Cream, Soda (diet and regular), Sweet Tea, Candies, Chips, Cookies, Sweet Pastries, Store Bought Juices, Alcohol in Excess of 1-2 drinks a day, Artificial Sweeteners, Coffee Creamer, and "Sugar-free" Products. This will help patient to have stable blood glucose profile and potentially avoid unintended weight gain.   - I encouraged the patient to switch to unprocessed or minimally processed complex  starch and increased protein intake (animal or plant source), fruits, and vegetables.   - Patient is advised to stick to a routine mealtimes to eat 3 meals a day and avoid unnecessary snacks (to snack only to correct hypoglycemia).  - she is scheduled with Jearld Fenton, RDN, CDE for diabetes education after her appt with me today.  - I have approached her with the following individualized plan to manage  her diabetes and patient agrees:   -She is advised to increase  Tresiba to 30 units SQ nightly, increase  Humalog  to 5-8 units TID with meals if glucose is above 100 and she is eating (Specific instructions on how to titrate insulin dosage based on glucose readings given to patient in writing), and continue her lower dose of Metformin 500 mg po twice daily with meals (better tolerated than the full dose).   -she is encouraged to continue monitoring blood glucose 4 times daily, before meals and before bed, and to call the clinic if she has readings less than 70 or greater than 300 for 3 tests in a row.  - she is warned not to take insulin without proper monitoring per orders. - Adjustment parameters are given to her for hypo and hyperglycemia in writing.  -She is not a candidate for incretin therapy due to body habitus.  - Specific targets for  A1c;  LDL, HDL,  and Triglycerides were discussed with the patient.  2) Blood Pressure /Hypertension: Her BP is controlled to target.   she is advised to continue her current medications including Valsartan 80 mg p.o. daily with breakfast.  3)  Weight/Diet:  her Body mass index is 20.11  kg/m.  -  she is not a candidate for weight loss.  Exercise, and detailed carbohydrates information provided  -  detailed on discharge instructions.  4) Hypothyroidism- unspecified There are no recent TFTs to review.  She is advised to continue her current dose of Levothyroxine at 125 mcg po daily before breakfast (above her weight based max dose of 94 mcg).    -  The correct intake of thyroid hormone (Levothyroxine, Synthroid), is on empty stomach first thing in the morning, with water, separated by at least 30 minutes from breakfast and other medications,  and separated by more than 4 hours from calcium, iron, multivitamins, acid reflux medications (PPIs).  - This medication is a life-long medication and will be needed to correct thyroid hormone imbalances for the rest of your life.  The dose may change from time to time, based on thyroid blood work.  - It is extremely important to be consistent taking this medication, near the same time each morning.  -AVOID TAKING PRODUCTS CONTAINING BIOTIN (commonly found in Hair, Skin, Nails vitamins) AS IT INTERFERES WITH THE VALIDITY OF THYROID FUNCTION BLOOD TESTS.   5) Chronic Care/Health Maintenance: -she is on ACEI/ARB and is encouraged to initiate and continue to follow up with Ophthalmology, Dentist, Podiatrist at least yearly or according to recommendations, and advised to stay away from smoking. I have recommended yearly flu vaccine and pneumonia vaccine at least every 5 years; moderate intensity exercise for up to 150 minutes weekly; and sleep for at least 7 hours a day.  - she is advised to maintain close follow up with Nekoosa Nation, MD for primary care needs, as well as her other providers for optimal and coordinated care.     I spent 43 minutes in the care of the patient today including review of labs from [place], Lipids, Thyroid Function, Hematology (current and previous including abstractions from other facilities); face-to-face time discussing  her blood glucose readings/logs, discussing hypoglycemia and hyperglycemia episodes and symptoms, medications doses, her options of short and long term treatment based on the latest standards of care / guidelines;  discussion about incorporating lifestyle medicine;  and documenting the encounter.    Please refer to Patient Instructions for Blood Glucose  Monitoring and Insulin/Medications Dosing Guide"  in media tab for additional information. Please  also refer to " Patient Self Inventory" in the Media  tab for reviewed elements of pertinent patient history.  Dola Factor  and her sister participated in the discussions, expressed understanding, and voiced agreement with the above plans.  All questions were answered to her satisfaction. she is encouraged to contact clinic should she have any questions or concerns prior to her return visit.   Follow up plan: - Return in about 3 months (around 05/03/2022) for Bring Meter and Logs- A1c in Office.  Rayetta Pigg, [facility] [address] [place], [place] [address] Phone: [phone] Fax: [phone]  01/31/2022, 8:30 PM

## 2022-01-31 NOTE — Patient Instructions (Signed)

## 2022-01-31 NOTE — Progress Notes (Signed)
 Nutrition Therapy 1445- 1515  Follow up ? ?Primary concerns today: Diabetes Type 2  ?Referral diagnosis: E11.8 ?Preferred learning style: no preference ? Learning readiness:  change in progress) ? ?NUTRITION ASSESSMENT  ?Saw Dr. Fransico Him today. Increased her Novolog with meals to 5 units. Tresiba 30 units nightly. ?Has been depressed  and has anxiety since losing her son to a  heart attach back in November 2022.[name] Has lost about 20-30 lbs in the last 2-3 yrs. Has been working on eating more to gain weight. ?Gained about 4 lbs since last visit. ?She takes 1/2 xanax night before bed. ? ? ?Anthropometrics  ?Wt Readings from Last 3 Encounters:  ?01/31/22 128 lb 6.4 oz (58.2 kg)  ?12/08/21 124 lb 4.8 oz (56.4 kg)  ?11/01/21 129 lb (58.5 kg)  ? ?Ht Readings from Last 3 Encounters:  ?01/31/22 5\' 7"  (1.702 m)  ?12/08/21 5\' 6"  (1.676 m)  ?11/01/21 5\' 7"  (1.702 m)  ? ?There is no height or weight on file to calculate BMI. ?@BMIFA @ ?Facility age limit for growth percentiles is 20 years. ?Facility age limit for growth percentiles is 20 years. ?Clinical ?Medical Hx: Type 2, GERD, hyperlipidemia, Hypothyroidism ?Medications:Tresiba 30, Novolog 5 with meals plus sliding scale. Metformin  1000 mg BID, ?Labs:  ? ?CMP Latest Ref Rng & Units 12/08/2021 10/17/2021 10/15/2021  ?Glucose 70 - 99 mg/dL ) 02/05/2022) 10/19/2021)  ?BUN 8 - 23 mg/dL 21 13 12   ?Creatinine 0.44 - 1.00 mg/dL 10/17/2021 923(R 007(M  ?Sodium 135 - 145 mmol/L 131(L) 131(L) 130(L)  ?Potassium 3.5 - 5.1 mmol/L 4.4 3.9 3.1(L)  ?Chloride 98 - 111 mmol/L 97(L) 103 104  ?CO2 22 - 32 mmol/L 23 22 19(L)  ?Calcium 8.9 - 10.3 mg/dL 226(J 8.7(L) 8.4(L)  ?Total Protein 6.5 - 8.1 g/dL 7.3 ) -  ?Total Bilirubin 0.3 - 1.2 mg/dL 0.6 0.5 -  ?Alkaline Phos 38 - 126 U/L 75 55 -  ?AST 15 - 41 U/L 15 15 -  ?ALT 0 - 44 U/L 16 12 -  ?  ?Lab Results  ?Component Value Date  ? HGBA1C 8.9 08/15/2021  ? ? ?Lipid Panel  ? ? ?Notable Signs/Symptoms:  ? ?Lifestyle & Dietary Hx ? ?Estimated daily fluid  intake: 64oz ?Supplements: Metformin, Humalog, 4.56 ? Sleep: 8 ?Stress / self-care:  ?Current average weekly physical activity: walks some ? ?24-Hr Dietary Recall ?Has been drinking 2 protein shakes per day. ? ?First Meal:  2 Eggs, 2 bacon and toast 1 slice and jelly and coffee, ?Snack: Second Meal:pineapple sandwich with cheese, Diet soda 1/2 c ?Snack:  ?Third Meal: Lvver pudding sandwich and cucumbers, coffee, Snack:  ?Beverages: 2-3 bottles of water ? ? ?Estimated Energy Needs ?Calories: 2.56 ?Carbohydrate: 170g ?Protein: 112g ?Fat: 42-50g ? ? ?NUTRITION DIAGNOSIS  ?NB-1.1 Food and nutrition-related knowledge deficit As related to Diabetes Type 2.  As evidenced by A1C 9.6% ? ? ?NUTRITION INTERVENTION  ?Nutrition education (E-1) on the following topics:  ?Nutrition and Diabetes education provided on My Plate, CHO counting, meal planning, portion sizes, timing of meals, avoiding snacks between meals unless having a low blood sugar, target ranges for A1C and blood sugars, signs/symptoms and treatment of hyper/hypoglycemia, monitoring blood sugars, taking medications as prescribed, benefits of exercising 30 minutes per day and prevention of complications of DM. ? ?Reviewed meal planning, carb counting, importance of preventing low blood sugars, eating on time and taking medications as prescribed. ? ? ?Handouts Provided Include  ?The Plate Method ? Meal Plan Card ?Diabetes  Instructions  ? ?Learning Style & Readiness for Change ?Teaching method utilized: Visual & Auditory  ?Demonstrated degree of understanding via: Teach Back  ?Barriers to learning/adherence to lifestyle change: none ? ?Goals Established by Pt ?Eat 30-45 g CHO at each meal ?Continue to increase high fiber protein rich foods with meals ?Don't skip meals ?Continue to drink 2 protein shakes a day in addition to your meals for needed weight gain. ? ?MONITORING & EVALUATION ?Dietary intake, weekly physical activity, and blood sugar  in 2-3  month. ? ?Recommend to consider a safer medication for depression and anxiety daily instead of xanax, such as SSI and/or mild antidepressant. She is at high risk for falls and disorientation with xanax at her age.. ? ?Next Steps  ?Patient is to meal planning. ? ?

## 2022-01-31 NOTE — Patient Instructions (Signed)
?  Goals Established by Pt ?Eat 30-45 g CHO at each meal ?Continue to increase high fiber protein rich foods with meals ?Don't skip meals ?Continue to drink 2 protein shakes a day in addition to your meals for needed weight gain. ?

## 2022-02-06 ENCOUNTER — Telehealth: Payer: Self-pay | Admitting: Nurse Practitioner

## 2022-02-06 NOTE — Telephone Encounter (Signed)
Pt is calling [facility] to order her Sensors ?

## 2022-02-07 ENCOUNTER — Ambulatory Visit: Payer: HMO | Admitting: Internal Medicine

## 2022-02-18 IMAGING — DX DG CHEST 1V PORT
1 series · 1 of 1 positions shown · non-contrast
Comparison: 12/07/2020

CLINICAL DATA: Shortness of breath for several days

EXAM:
PORTABLE CHEST 1 VIEW

[chest ap]
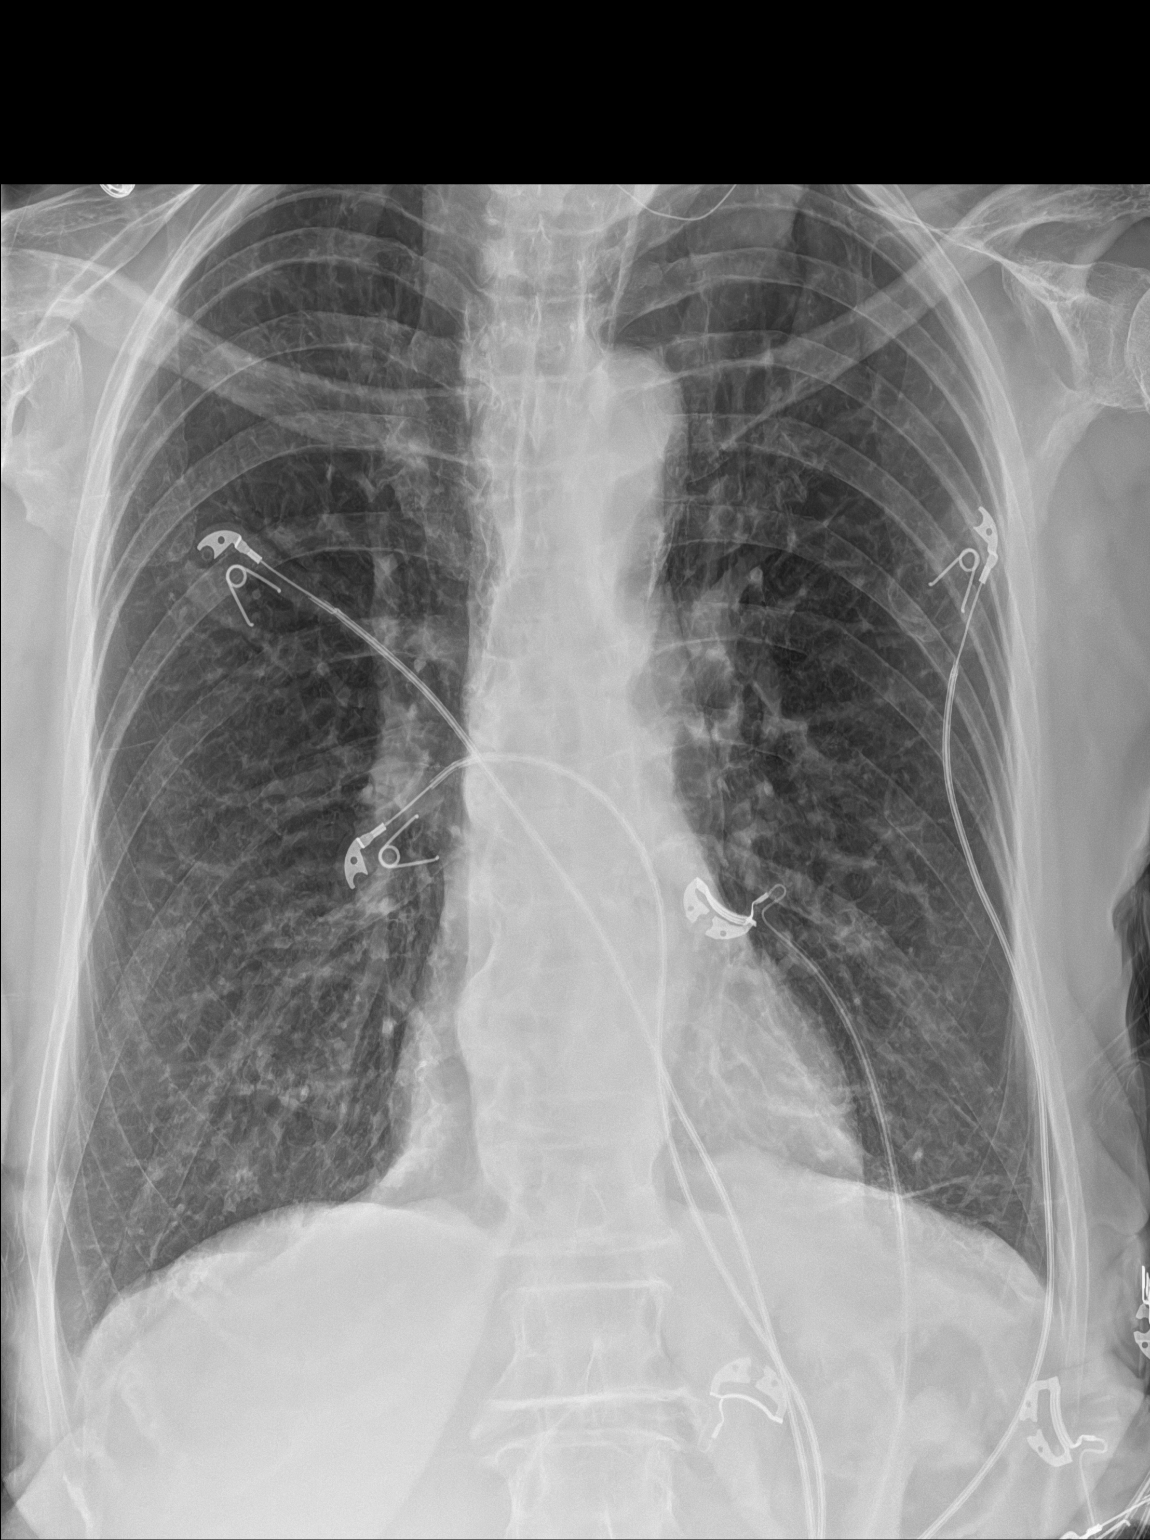

[1 of 1 positions shown; findings below may reference images not displayed]

FINDINGS: Cardiac shadow is within normal limits. Aortic calcifications are
noted. The lungs are hyperinflated consistent with COPD. No focal
infiltrate or effusion is noted. No bony abnormality is seen.
IMPRESSION: COPD without acute abnormality.

## 2022-02-21 ENCOUNTER — Ambulatory Visit: Payer: HMO | Admitting: Internal Medicine

## 2022-02-22 ENCOUNTER — Telehealth: Payer: Self-pay | Admitting: Nurse Practitioner

## 2022-02-22 MED ORDER — INSULIN LISPRO (1 UNIT DIAL) 100 UNIT/ML (KWIKPEN): 5.0000 [IU] | PEN_INJECTOR | Freq: Three times a day (TID) | SUBCUTANEOUS | 3 refills | Status: DC

## 2022-02-22 MED ORDER — TRESIBA FLEXTOUCH 100 UNIT/ML ~~LOC~~ SOPN: 30.0000 [IU] | PEN_INJECTOR | Freq: Every day | SUBCUTANEOUS | 3 refills | Status: DC

## 2022-02-22 NOTE — Telephone Encounter (Signed)
Done

## 2022-02-22 NOTE — Telephone Encounter (Signed)
Patient said you changed her insulin at last appt. Joseph Berkshire and Humalog. She said she needs new RX sent in to [facility] ?

## 2022-03-22 ENCOUNTER — Telehealth: Payer: Self-pay | Admitting: Nurse Practitioner

## 2022-03-22 NOTE — Telephone Encounter (Signed)
Patient said that her Gabrielle Wood is not covered by insurance. She would like to know what the next steps are? ?

## 2022-03-23 NOTE — Telephone Encounter (Signed)
We can try Dexcom.  I will send in the script for her to [facility]

## 2022-03-23 NOTE — Telephone Encounter (Signed)
Called the patient and left a detailed voice message to let her know that we are going to send the Dexcom in for her and will follow up soon. ?

## 2022-03-29 NOTE — Telephone Encounter (Signed)
Called [facility] and spoke to [place] and she stated that the order is not ready to ship out until May 20th. ? ?I called the patient and she stated that she actually has 2 sensors left and will call [employer] to confirm her order. ?

## 2022-03-29 NOTE — Telephone Encounter (Signed)
Pt is going to her house and would like the call back to be at [phone] ?

## 2022-03-29 NOTE — Telephone Encounter (Signed)
Patient has not heard from [facility] and only has one more left of what she is wearing. Can you call pt?  [phone] ?

## 2022-05-08 NOTE — Patient Instructions (Signed)
Diabetes Mellitus and Foot Care Foot care is an important part of your health, especially when you have diabetes. Diabetes may cause you to have problems because of poor blood flow (circulation) to your feet and legs, which can cause your skin to: Become thinner and drier. Break more easily. Heal more slowly. Peel and crack. You may also have nerve damage (neuropathy) in your legs and feet, causing decreased feeling in them. This means that you may not notice minor injuries to your feet that could lead to more serious problems. Noticing and addressing any potential problems early is the best way to prevent future foot problems. How to care for your feet Foot hygiene  Wash your feet daily with warm water and mild soap. Do not use hot water. Then, pat your feet and the areas between your toes until they are completely dry. Do not soak your feet as this can dry your skin. Trim your toenails straight across. Do not dig under them or around the cuticle. File the edges of your nails with an emery board or nail file. Apply a moisturizing lotion or petroleum jelly to the skin on your feet and to dry, brittle toenails. Use lotion that does not contain alcohol and is unscented. Do not apply lotion between your toes. Shoes and socks Wear clean socks or stockings every day. Make sure they are not too tight. Do not wear knee-high stockings since they may decrease blood flow to your legs. Wear shoes that fit properly and have enough cushioning. Always look in your shoes before you put them on to be sure there are no objects inside. To break in new shoes, wear them for just a few hours a day. This prevents injuries on your feet. Wounds, scrapes, corns, and calluses  Check your feet daily for blisters, cuts, bruises, sores, and redness. If you cannot see the bottom of your feet, use a mirror or ask someone for help. Do not cut corns or calluses or try to remove them with medicine. If you find a minor scrape,  cut, or break in the skin on your feet, keep it and the skin around it clean and dry. You may clean these areas with mild soap and water. Do not clean the area with peroxide, alcohol, or iodine. If you have a wound, scrape, corn, or callus on your foot, look at it several times a day to make sure it is healing and not infected. Check for: Redness, swelling, or pain. Fluid or blood. Warmth. Pus or a bad smell. General tips Do not cross your legs. This may decrease blood flow to your feet. Do not use heating pads or hot water bottles on your feet. They may burn your skin. If you have lost feeling in your feet or legs, you may not know this is happening until it is too late. Protect your feet from hot and cold by wearing shoes, such as at the beach or on hot pavement. Schedule a complete foot exam at least once a year (annually) or more often if you have foot problems. Report any cuts, sores, or bruises to your health care provider immediately. Where to find more information American Diabetes Association: www.diabetes.org Association of Diabetes [employer] & Education Specialists: www.diabeteseducator.org Contact a health care provider if: You have a medical condition that increases your risk of infection and you have any cuts, sores, or bruises on your feet. You have an injury that is not healing. You have redness on your legs or feet. You   feel burning or tingling in your legs or feet. You have pain or cramps in your legs and feet. Your legs or feet are numb. Your feet always feel cold. You have pain around any toenails. Get help right away if: You have a wound, scrape, corn, or callus on your foot and: You have pain, swelling, or redness that gets worse. You have fluid or blood coming from the wound, scrape, corn, or callus. Your wound, scrape, corn, or callus feels warm to the touch. You have pus or a bad smell coming from the wound, scrape, corn, or callus. You have a fever. You have a red  line going up your leg. Summary Check your feet every day for blisters, cuts, bruises, sores, and redness. Apply a moisturizing lotion or petroleum jelly to the skin on your feet and to dry, brittle toenails. Wear shoes that fit properly and have enough cushioning. If you have foot problems, report any cuts, sores, or bruises to your health care provider immediately. Schedule a complete foot exam at least once a year (annually) or more often if you have foot problems. This information is not intended to replace advice given to you by your health care provider. Make sure you discuss any questions you have with your health care provider. Document Revised: 06/09/2020 Document Reviewed: 06/09/2020 Elsevier Patient Education  2023 [employer].  

## 2022-05-09 ENCOUNTER — Telehealth: Payer: Self-pay

## 2022-05-09 ENCOUNTER — Other Ambulatory Visit: Payer: Self-pay | Admitting: Nurse Practitioner

## 2022-05-09 ENCOUNTER — Encounter: Payer: HMO | Admitting: Nurse Practitioner

## 2022-05-09 VITALS — Ht 67.0 in

## 2022-05-09 DIAGNOSIS — E039 Hypothyroidism, unspecified: Secondary | ICD-10-CM

## 2022-05-09 DIAGNOSIS — Z794 Long term (current) use of insulin: Secondary | ICD-10-CM

## 2022-05-09 DIAGNOSIS — E559 Vitamin D deficiency, unspecified: Secondary | ICD-10-CM

## 2022-05-09 NOTE — Telephone Encounter (Signed)
Pt was given the Dexcom G7 receiver and sensors to last until she comes back in 3 weeks. Spoke to pts caregiver and explained to her that the dexcom we gave her today is to get BG readings for her next visit. Pt states that her CGM is broken.

## 2022-05-09 NOTE — Progress Notes (Signed)
Erroneous encounter

## 2022-05-23 ENCOUNTER — Telehealth: Payer: Self-pay | Admitting: Nurse Practitioner

## 2022-05-23 NOTE — Telephone Encounter (Signed)
Called patient's son because she is having trouble with the Dexcom and is confused about it. I tried to call her son, mike and he said he has been trying to figure it out as well, he told me to call her sister [name], I attempted 2x and no answer. I called pt 2x no answer

## 2022-05-23 NOTE — Telephone Encounter (Signed)
Patient called and needs to be walked through how to use her Dexcom G7

## 2022-05-23 NOTE — Telephone Encounter (Signed)
Line busy

## 2022-05-27 ENCOUNTER — Other Ambulatory Visit: Payer: Self-pay

## 2022-05-27 ENCOUNTER — Encounter (HOSPITAL_COMMUNITY): Payer: Self-pay

## 2022-05-27 ENCOUNTER — Emergency Department (HOSPITAL_COMMUNITY)
Admission: EM | Admit: 2022-05-27 | Discharge: 2022-05-27 | Disposition: A | Discharge status: Home / Self Care | Payer: HMO | Attending: Emergency Medicine | Admitting: Emergency Medicine

## 2022-05-27 ENCOUNTER — Emergency Department (HOSPITAL_COMMUNITY): Payer: HMO

## 2022-05-27 DIAGNOSIS — J449 Chronic obstructive pulmonary disease, unspecified: Secondary | ICD-10-CM | POA: Diagnosis not present

## 2022-05-27 DIAGNOSIS — Z79899 Other long term (current) drug therapy: Secondary | ICD-10-CM | POA: Diagnosis not present

## 2022-05-27 DIAGNOSIS — I10 Essential (primary) hypertension: Secondary | ICD-10-CM | POA: Diagnosis not present

## 2022-05-27 DIAGNOSIS — E119 Type 2 diabetes mellitus without complications: Secondary | ICD-10-CM | POA: Insufficient documentation

## 2022-05-27 DIAGNOSIS — E039 Hypothyroidism, unspecified: Secondary | ICD-10-CM | POA: Insufficient documentation

## 2022-05-27 DIAGNOSIS — R1032 Left lower quadrant pain: Secondary | ICD-10-CM | POA: Insufficient documentation

## 2022-05-27 DIAGNOSIS — Z7984 Long term (current) use of oral hypoglycemic drugs: Secondary | ICD-10-CM | POA: Insufficient documentation

## 2022-05-27 DIAGNOSIS — Z794 Long term (current) use of insulin: Secondary | ICD-10-CM | POA: Insufficient documentation

## 2022-05-27 DIAGNOSIS — R1030 Lower abdominal pain, unspecified: Secondary | ICD-10-CM

## 2022-05-27 DIAGNOSIS — Z7982 Long term (current) use of aspirin: Secondary | ICD-10-CM | POA: Diagnosis not present

## 2022-05-27 DIAGNOSIS — R531 Weakness: Secondary | ICD-10-CM | POA: Insufficient documentation

## 2022-05-27 LAB — CBC WITH DIFFERENTIAL/PLATELET
Abs Immature Granulocytes: 0.02 10*3/uL (ref 0.00–0.07)
Basophils Absolute: 0.1 10*3/uL (ref 0.0–0.1)
Basophils Relative: 1 %
Eosinophils Absolute: 0.1 10*3/uL (ref 0.0–0.5)
Eosinophils Relative: 1 %
HCT: 41.4 % (ref 36.0–46.0)
Hemoglobin: 14 g/dL (ref 12.0–15.0)
Immature Granulocytes: 0 %
Lymphocytes Relative: 17 %
Lymphs Abs: 0.9 10*3/uL (ref 0.7–4.0)
MCH: 31.3 pg (ref 26.0–34.0)
MCHC: 33.8 g/dL (ref 30.0–36.0)
MCV: 92.4 fL (ref 80.0–100.0)
Monocytes Absolute: 0.7 10*3/uL (ref 0.1–1.0)
Monocytes Relative: 13 %
Neutro Abs: 3.8 10*3/uL (ref 1.7–7.7)
Neutrophils Relative %: 68 %
Platelets: 166 10*3/uL (ref 150–400)
RBC: 4.48 MIL/uL (ref 3.87–5.11)
RDW: 14.1 % (ref 11.5–15.5)
WBC: 5.6 10*3/uL (ref 4.0–10.5)
nRBC: 0 % (ref 0.0–0.2)

## 2022-05-27 LAB — URINALYSIS, ROUTINE W REFLEX MICROSCOPIC
Bacteria, UA: NONE SEEN
Bilirubin Urine: NEGATIVE
Glucose, UA: >=500 mg/dL — AB
Hgb urine dipstick: NEGATIVE
Ketones, ur: 5 mg/dL — AB
Leukocytes,Ua: NEGATIVE
Nitrite: NEGATIVE
Protein, ur: NEGATIVE mg/dL
Specific Gravity, Urine: 1.015 (ref 1.005–1.030)
pH: 7 (ref 5.0–8.0)

## 2022-05-27 LAB — BASIC METABOLIC PANEL
Anion gap: 7 (ref 5–15)
BUN: 21 mg/dL (ref 8–23)
CO2: 23 mmol/L (ref 22–32)
Calcium: 9.9 mg/dL (ref 8.9–10.3)
Chloride: 107 mmol/L (ref 98–111)
Creatinine, Ser: 0.68 mg/dL (ref 0.44–1.00)
GFR, Estimated: >60 mL/min (ref >60)
Glucose, Bld: 130 mg/dL — ABNORMAL HIGH (ref 70–99)
Potassium: 3.7 mmol/L (ref 3.5–5.1)
Sodium: 137 mmol/L (ref 135–145)

## 2022-05-27 LAB — HEPATIC FUNCTION PANEL
ALT: 13 U/L (ref 0–44)
AST: 17 U/L (ref 15–41)
Albumin: 4.1 g/dL (ref 3.5–5.0)
Alkaline Phosphatase: 48 U/L (ref 38–126)
Bilirubin, Direct: 0.1 mg/dL (ref 0.0–0.2)
Indirect Bilirubin: 0.7 mg/dL (ref 0.3–0.9)
Total Bilirubin: 0.8 mg/dL (ref 0.3–1.2)
Total Protein: 7.3 g/dL (ref 6.5–8.1)

## 2022-05-27 LAB — CBG MONITORING, ED: Glucose-Capillary: 130 mg/dL — ABNORMAL HIGH (ref 70–99)

## 2022-05-27 NOTE — ED Triage Notes (Signed)
Pt was seen at [facility] yesterday for weakness, nausea, low bp and high blood sugar (family member states bs was around 300). Diagnosed with UTI and prescribed amoxicillin. Pt c/o abdominal pain. States she started having diarrhea this morning, no vomiting. No missed insulin doses.

## 2022-05-27 NOTE — ED Provider Notes (Signed)
[facility] EMERGENCY DEPARTMENT Provider Note   CSN: [id number] Arrival date & time: 05/27/22  1003     History  Chief Complaint  Patient presents with   Weakness    Gabrielle Wood is a 76 y.o. female.   Weakness   Patient with medical history of hypertension, hyperlipidemia, hypothyroid, GERD, type 2 diabetes on insulin, COPD, history of NSTEMI presents today due to abdominal pain and weakness.  Patient states she started having left lower quadrant abdominal pain 3 to 4 days ago.  The pain is constant, associated with diarrhea.  No nausea or vomiting or fevers.  Was seen yesterday at [facility] ED, diagnosed with UTI and started on Augmentin.  They did a CT scan of the abdomen and pelvis which showed left colonic diverticulosis but no acute process.  Patient presents today due to being generally weak and not improving.  Patient is also concerned that her blood pressure may be low and her blood sugar may be high.  She endorses medical compliance but states she has a hard time regulating her blood sugar at home.  Patient denies any chest pain, shortness of breath, blood in the diarrhea, recent travel, recent antibiotics other than the Augmentin, vomiting, recent changes in medicine, hematemesis, shortness of breath, recent falls, dizziness.  Patient is accompanied by her sister.  Sister states patient lives independently, she has been having a general decline in health since her previous NSTEMI.  Sister states the patient is going to be moving in with her so she can help take care of her.  Home Medications Prior to Admission medications   Medication Sig Start Date End Date Taking? Authorizing Provider  ALPRAZolam Prudy Feeler) 1 MG tablet Take 0.5-1 mg by mouth. 0.5 tablet in am and 0.5 tab at night 04/11/21   [provider]  amLODipine (NORVASC) 5 MG tablet Take 5 mg by mouth daily. 04/20/21   [provider]  aspirin EC 81 MG tablet Take 81 mg by mouth daily.  Swallow whole.    [provider]  atorvastatin (LIPITOR) 80 MG tablet Take 80 mg by mouth every evening. 11/24/21   [provider]  gabapentin (NEURONTIN) 300 MG capsule Take 300 mg by mouth 3 (three) times daily. 07/29/21   [provider]  insulin lispro (HUMALOG) 100 UNIT/ML KwikPen Inject 5-8 Units into the skin 3 (three) times daily. 02/22/22   Dani Gobble, NP  levocetirizine (XYZAL) 5 MG tablet Take 1 tablet (5 mg total) by mouth every evening. 04/13/21   Eber Hong, MD  levothyroxine (SYNTHROID) 125 MCG tablet TAKE ONE TABLET BY MOUTH DAILY 05/09/22   Dani Gobble, NP  loperamide (IMODIUM) 2 MG capsule Take 2 mg by mouth 4 (four) times daily as needed. 06/28/21   [provider]  magnesium oxide (MAG-OX) 400 MG tablet Take by mouth. 11/21/21 11/21/22  [provider]  metFORMIN (GLUCOPHAGE) 500 MG tablet Take 1 tablet (500 mg total) by mouth 2 (two) times daily with a meal. 2 tabs bid 11/01/21   Dani Gobble, NP  metoprolol succinate (TOPROL-XL) 50 MG 24 hr tablet Take 1 tablet (50 mg total) by mouth every evening. Take with or immediately following a meal. 10/17/21 12/08/21  Shahmehdi, Gemma Payor, MD  nitroGLYCERIN (NITROSTAT) 0.4 MG SL tablet Place 0.4 mg under the tongue every 5 (five) minutes as needed for chest pain.    [provider]  ondansetron (ZOFRAN-ODT) 4 MG disintegrating tablet Take 4 mg by mouth  every 8 (eight) hours as needed. 06/27/21   [provider]  pantoprazole (PROTONIX) 40 MG tablet Take 40 mg by mouth daily. 06/06/21   [provider]  ticagrelor (BRILINTA) 90 MG TABS tablet Take 90 mg by mouth 2 (two) times daily.    [provider]  TRESIBA FLEXTOUCH 100 UNIT/ML FlexTouch Pen Inject 30 Units into the skin at bedtime. 02/22/22   Dani Gobble, NP  valsartan (DIOVAN) 80 MG tablet Take 40 mg by mouth daily.    [provider]  vitamin C (ASCORBIC ACID) 500 MG tablet  Take 500 mg by mouth daily.    [provider]  fexofenadine (ALLEGRA) 60 MG tablet Take by mouth.  04/13/21  [provider]      Allergies    Ciprocin-fluocin-procin [fluocinolone], Lipitor [atorvastatin], Lisinopril, and Red yeast rice [cholestin]    Review of Systems   Review of Systems  Neurological:  Positive for weakness.    Physical Exam Updated Vital Signs BP (!) 143/82   Pulse 67   Temp 97.7 F (36.5 C)   Resp (!) 23   Ht 5\' 7"  (1.702 m)   Wt 58 kg   SpO2 98%   BMI 20.03 kg/m  Physical Exam Vitals and nursing note reviewed. Exam conducted with a chaperone present.  Constitutional:      Appearance: Normal appearance.     Comments: Frail appearing but mentating well.  Not in any acute distress  HENT:     Head: Normocephalic and atraumatic.  Eyes:     General: No scleral icterus.       Right eye: No discharge.        Left eye: No discharge.     Extraocular Movements: Extraocular movements intact.     Pupils: Pupils are equal, round, and reactive to light.  Cardiovascular:     Rate and Rhythm: Normal rate and regular rhythm.     Pulses: Normal pulses.     Heart sounds: Normal heart sounds. No murmur heard.    No friction rub. No gallop.  Pulmonary:     Effort: Pulmonary effort is normal. No respiratory distress.     Breath sounds: Normal breath sounds.     Comments: Coarse lung sounds diffusely Abdominal:     General: Abdomen is flat. Bowel sounds are normal. There is no distension.     Palpations: Abdomen is soft.     Tenderness: There is abdominal tenderness.     Comments: Diffuse left lower quadrant tenderness, no rigidity or guarding.  Abdomen is soft  Skin:    General: Skin is warm and dry.     Coloration: Skin is not jaundiced.  Neurological:     Mental Status: She is alert. Mental status is at baseline.     Coordination: Coordination normal.  Psychiatric:        Mood and Affect: Mood normal.     ED Results / Procedures /  Treatments   Labs (all labs ordered are listed, but only abnormal results are displayed) Labs Reviewed  BASIC METABOLIC PANEL - Abnormal; Notable for the following components:      Result Value   Glucose, Bld 130 (*)    All other components within normal limits  URINALYSIS, ROUTINE W REFLEX MICROSCOPIC - Abnormal; Notable for the following components:   Glucose, UA >=500 (*)    Ketones, ur 5 (*)    All other components within normal limits  CBG MONITORING, ED - Abnormal; Notable for  the following components:   Glucose-Capillary 130 (*)    All other components within normal limits  URINE CULTURE  CBC WITH DIFFERENTIAL/PLATELET  HEPATIC FUNCTION PANEL  CBG MONITORING, ED    EKG EKG Interpretation  Date/Time:  Sunday May 27 2022 10:25:17 EDT Ventricular Rate:  72 PR Interval:  166 QRS Duration: 86 QT Interval:  409 QTC Calculation: 448 R Axis:   -72 Text Interpretation: Sinus rhythm Left anterior fascicular block Consider left ventricular hypertrophy Baseline wander in lead(s) III aVF No significant change since last tracing Confirmed by Vanetta Mulders [phone]) on 05/27/2022 10:35:38 AM  Radiology DG Chest Portable 1 View  Result Date: 05/27/2022 CLINICAL DATA:  Weakness, nausea EXAM: PORTABLE CHEST 1 VIEW COMPARISON:  12/08/2021 FINDINGS: The heart size and mediastinal contours are within normal limits. Both lungs are clear. The visualized skeletal structures are unremarkable. IMPRESSION: No active disease. Electronically Signed   By: Marlan Palau M.D.   On: 05/27/2022 11:13    Procedures Procedures    Medications Ordered in ED Medications - No data to display  ED Course/ Medical Decision Making/ A&P Clinical Course as of 05/27/22 1314  Sun May 27, 2022  1230 Resp(!): 28 18 in room [HS]    Clinical Course User Index [HS] Theron Arista, PA-C                           Medical Decision Making Amount and/or Complexity of Data Reviewed Labs: ordered. Radiology:  ordered.   Patient presents due to abdominal pain and weakness.  Differential is broad but includes UTI, diverticulitis, sepsis, pyelonephritis, electrolyte derangement, AKI, pneumonia, arrhythmia.  I reviewed note from ED visit yesterday.  I also reviewed the CT which showed diverticulosis but did not show any signs of diverticulitis specifically.  Patient is on Augmentin, I think that might be contributing to the diarrhea.  I viewed the chest x-ray which does not show any pneumonia.  I ordered and viewed laboratory work-up.  No leukocytosis or anemia.  BMP and hepatic function panel without any acute derangement.  No signs of DKA.  Patient is glucosuria but no specific underlying UTI noted.  Urine culture obtained and is pending.  EKG shows no new changes from previous.  Patient was in cardiac monitoring, in sinus rhythm during my evaluation.  Vitals are stable, blood pressure stable.  She is not hypoglycemic or hyperglycemic.  Patient is ambulatory with steady gait.  On reevaluation her abdomen is soft, there is no focal tenderness.    I do not feel this time she needs admission.  I discussed with patient and her sister who agree.  I have encouraged her continuing the Augmentin, return precautions were discussed and discharged in stable condition.  I discussed this case with my attending Dr. Deretha Emory who agrees with this plan        Final Clinical Impression(s) / ED Diagnoses Final diagnoses:  None    Rx / DC Orders ED Discharge Orders     None         Theron Arista, Cordelia Poche 05/27/22 1314    Vanetta Mulders, MD 06/02/22 [id number]

## 2022-05-28 ENCOUNTER — Ambulatory Visit: Payer: HMO | Admitting: Nurse Practitioner

## 2022-05-29 LAB — URINE CULTURE: Culture: NO GROWTH

## 2022-06-19 ENCOUNTER — Encounter: Payer: Self-pay | Admitting: Nurse Practitioner

## 2022-06-19 ENCOUNTER — Ambulatory Visit (INDEPENDENT_AMBULATORY_CARE_PROVIDER_SITE_OTHER): Payer: HMO | Admitting: Nurse Practitioner

## 2022-06-19 VITALS — BP 123/76 | HR 74 | Ht 67.0 in | Wt 131.0 lb

## 2022-06-19 DIAGNOSIS — E11649 Type 2 diabetes mellitus with hypoglycemia without coma: Secondary | ICD-10-CM

## 2022-06-19 DIAGNOSIS — Z794 Long term (current) use of insulin: Secondary | ICD-10-CM

## 2022-06-19 DIAGNOSIS — E559 Vitamin D deficiency, unspecified: Secondary | ICD-10-CM | POA: Diagnosis not present

## 2022-06-19 DIAGNOSIS — E039 Hypothyroidism, unspecified: Secondary | ICD-10-CM

## 2022-06-19 LAB — POCT GLYCOSYLATED HEMOGLOBIN (HGB A1C): HbA1c POC (<> result, manual entry): 8.5 % (ref 4.0–5.6)

## 2022-06-19 MED ORDER — INSULIN LISPRO (1 UNIT DIAL) 100 UNIT/ML (KWIKPEN): 4.0000 [IU] | PEN_INJECTOR | Freq: Three times a day (TID) | SUBCUTANEOUS | 3 refills | Status: DC

## 2022-06-19 MED ORDER — TRESIBA FLEXTOUCH 100 UNIT/ML ~~LOC~~ SOPN: 20.0000 [IU] | PEN_INJECTOR | Freq: Every day | SUBCUTANEOUS | 3 refills | Status: DC

## 2022-06-19 NOTE — Patient Instructions (Signed)

## 2022-06-19 NOTE — Progress Notes (Signed)
Endocrinology Follow Up Note       06/19/2022, 4:09 PM   Subjective:    Patient ID: Gabrielle Wood, female    DOB: 1946-11-29.  Gabrielle Wood is being seen in follow up after being seen in consultation for management of currently uncontrolled symptomatic diabetes requested by  [name], MD.   Past Medical History:  Diagnosis Date   Arthritis    COPD (chronic obstructive pulmonary disease) (HCC)    Diabetes mellitus    DVT (deep venous thrombosis) (HCC)    years ago   GERD (gastroesophageal reflux disease)    H. pylori infection 2013   S/p treatment with amoxicillin, Biaxin, PPI   High cholesterol    Hypertension    Hypothyroidism    NSTEMI (non-ST elevated myocardial infarction) (HCC) 12/2020   s/p  PCI DES to the LCx   Thyroid disease     Past Surgical History:  Procedure Laterality Date   BACK SURGERY     lumbar x3    ESOPHAGOGASTRODUODENOSCOPY  04/04/2012   Moderate gastritis/MODERATE Duodenitis in the duodenal bulb duodenum.  Pathology with chronic H. pylori gastritis.   EUS  05/14/2012   Normal pancreas, gallbladder,biliary tree/ Incidental finding of simple-appearing right renal cyst.   TONSILLECTOMY     TUBAL LIGATION     VEIN SURGERY     stripping years ago but recent laser surgery; bilateral legs    Social History   Socioeconomic History   Marital status: Widowed    Spouse name: Not on file   Number of children: 2   Years of education: Not on file   Highest education level: Not on file  Occupational History   Occupation: retired, Advertising copywriter at [employer]  Tobacco Use   Smoking status: Former    Packs/day: 0.50    Types: Cigarettes   Smokeless tobacco: Never  Vaping Use   Vaping Use: Never used  Substance and Sexual Activity   Alcohol use: No   Drug use: No   Sexual activity: Not on file  Other Topics Concern   Not on file  Social History Narrative   Not on file    Social Determinants of Health   Financial Resource Strain: Not on file  Food Insecurity: Not on file  Transportation Needs: Not on file  Physical Activity: Not on file  Stress: Not on file  Social Connections: Not on file    Family History  Problem Relation Age of Onset   Pancreatic cancer Mother        diagnosed at age 3s, died from heart attack   Inflammatory bowel disease Brother        crohn's, his dgt deceased from colon cancer at age 85   Colon cancer Neg Hx     Outpatient Encounter Medications as of 06/19/2022  Medication Sig   ALPRAZolam (XANAX) 1 MG tablet Take 0.5-1 mg by mouth. 0.5 tablet in am and 0.5 tab at night   amLODipine (NORVASC) 5 MG tablet Take 5 mg by mouth daily.   aspirin EC 81 MG tablet Take 81 mg by mouth daily. Swallow whole.   atorvastatin (LIPITOR) 80 MG tablet Take 80 mg by mouth  every evening.   gabapentin (NEURONTIN) 300 MG capsule Take 300 mg by mouth 3 (three) times daily.   insulin lispro (HUMALOG) 100 UNIT/ML KwikPen Inject 4-7 Units into the skin 3 (three) times daily.   levocetirizine (XYZAL) 5 MG tablet Take 1 tablet (5 mg total) by mouth every evening.   levothyroxine (SYNTHROID) 125 MCG tablet TAKE ONE TABLET BY MOUTH DAILY   loperamide (IMODIUM) 2 MG capsule Take 2 mg by mouth 4 (four) times daily as needed.   magnesium oxide (MAG-OX) 400 MG tablet Take by mouth.   metFORMIN (GLUCOPHAGE) 500 MG tablet Take 1 tablet (500 mg total) by mouth 2 (two) times daily with a meal. 2 tabs bid (Patient taking differently: Take 500 mg by mouth 2 (two) times daily with a meal.)   metoprolol succinate (TOPROL-XL) 50 MG 24 hr tablet Take 1 tablet (50 mg total) by mouth every evening. Take with or immediately following a meal.   nitroGLYCERIN (NITROSTAT) 0.4 MG SL tablet Place 0.4 mg under the tongue every 5 (five) minutes as needed for chest pain.   ondansetron (ZOFRAN-ODT) 4 MG disintegrating tablet Take 4 mg by mouth every 8 (eight) hours as needed.    pantoprazole (PROTONIX) 40 MG tablet Take 40 mg by mouth daily.   ticagrelor (BRILINTA) 90 MG TABS tablet Take 90 mg by mouth 2 (two) times daily.   TRESIBA FLEXTOUCH 100 UNIT/ML FlexTouch Pen Inject 20 Units into the skin at bedtime.   valsartan (DIOVAN) 80 MG tablet Take 40 mg by mouth daily.   vitamin C (ASCORBIC ACID) 500 MG tablet Take 500 mg by mouth daily.   [DISCONTINUED] fexofenadine (ALLEGRA) 60 MG tablet Take by mouth.   [DISCONTINUED] insulin lispro (HUMALOG) 100 UNIT/ML KwikPen Inject 5-8 Units into the skin 3 (three) times daily.   [DISCONTINUED] TRESIBA FLEXTOUCH 100 UNIT/ML FlexTouch Pen Inject 30 Units into the skin at bedtime.   No facility-administered encounter medications on file as of 06/19/2022.    ALLERGIES: Allergies  Allergen Reactions   Ciprocin-Fluocin-Procin [Fluocinolone]     Nightmares   Lipitor [Atorvastatin] Other (See Comments)    Muscle aches, pain   Lisinopril Cough   Red Yeast Rice [Cholestin] Itching and Rash    VACCINATION STATUS: Immunization History  Administered Date(s) Administered   Pneumococcal Polysaccharide-23 04/04/2012    Diabetes She presents for her follow-up diabetic visit. She has type 2 diabetes mellitus. Onset time: Was diagnosed at approx age of 76. Her disease course has been improving. Hypoglycemia symptoms include sweats and tremors. Associated symptoms include fatigue and foot paresthesias. Pertinent negatives for diabetes include no polydipsia, no polyuria and no weight loss. There are no hypoglycemic complications. Symptoms are improving. Diabetic complications include heart disease and peripheral neuropathy. (Had a MI in the past, recently hospitalized for DKA) Risk factors for coronary artery disease include diabetes mellitus, dyslipidemia, hypertension, post-menopausal and sedentary lifestyle. Current diabetic treatment includes oral agent (monotherapy) and intensive insulin program. She is compliant with treatment most  of the time. Her weight is fluctuating minimally. She is following a generally healthy diet. When asked about meal planning, she reported none. She has not had a previous visit with a dietitian (Has appt with Boyd Kerbsenny after me today). She participates in exercise intermittently. Her home blood glucose trend is decreasing steadily. Her overall blood glucose range is >200 mg/dl. (She presents today, accompanied by her sister, with her CGM and logs showing fluctuating glycemic profile but improved overall.  She does have some trouble getting  her CGM sensor to stay on for the full 10 days.  Her POCT A1c today is 8.5%, improving from last visit of 9.3%.  She still has dips in glucose and subsequent highs due to treating lows.  ) An ACE inhibitor/angiotensin II receptor blocker is being taken. She does not see a podiatrist.Eye exam is current.  Hypertension This is a chronic problem. The current episode started more than 1 year ago. The problem has been resolved since onset. The problem is controlled. Associated symptoms include sweats. Agents associated with hypertension include thyroid hormones. Risk factors for coronary artery disease include diabetes mellitus, dyslipidemia, post-menopausal state and sedentary lifestyle. Past treatments include angiotensin blockers, calcium channel blockers and beta blockers. The current treatment provides significant improvement. There are no compliance problems.  Hypertensive end-organ damage includes CAD/MI. Identifiable causes of hypertension include a thyroid problem.     Review of systems  Constitutional: + stable body weight,  current Body mass index is 20.52 kg/m. , + fatigue- deconditioned, no subjective hyperthermia, no subjective hypothermia Eyes: no blurry vision, no xerophthalmia ENT: no sore throat, no nodules palpated in throat, no dysphagia/odynophagia, no hoarseness Cardiovascular: no chest pain, no shortness of breath, no palpitations, no leg  swelling Respiratory: no cough, no shortness of breath Gastrointestinal: no nausea/vomiting/diarrhea Musculoskeletal: no muscle/joint aches Skin: no rashes, no hyperemia Neurological: no tremors, + numbness/tingling to BLE, no dizziness Psychiatric: no depression, no anxiety  Objective:     BP 123/76   Pulse 74   Ht 5\' 7"  (1.702 m)   Wt 131 lb (59.4 kg)   BMI 20.52 kg/m   Wt Readings from Last 3 Encounters:  06/19/22 131 lb (59.4 kg)  05/27/22 127 lb 13.9 oz (58 kg)  01/31/22 128 lb (58.1 kg)     BP Readings from Last 3 Encounters:  06/19/22 123/76  05/27/22 (!) 141/78  01/31/22 135/82      Physical Exam- Limited  Constitutional:  Body mass index is 20.52 kg/m. , not in acute distress, normal state of mind Eyes:  EOMI, no exophthalmos Neck: Supple Cardiovascular: RRR, no murmurs, rubs, or gallops, no edema Respiratory: Adequate breathing efforts, no crackles, rales, rhonchi, or wheezing Musculoskeletal: no gross deformities, strength intact in all four extremities, no gross restriction of joint movements, walks with cane- hand held assistance from sister today Skin:  no rashes, no hyperemia Neurological: no tremor with outstretched hands   Diabetic Foot Exam - Simple   Simple Foot Form Visual Inspection No deformities, no ulcerations, no other skin breakdown bilaterally: Yes Sensation Testing See comments: Yes Pulse Check Posterior Tibialis and Dorsalis pulse intact bilaterally: Yes Comments Decreased sensation to monofilament tool bilaterally.      CMP ( most recent) CMP     Component Value Date/Time   NA 137 05/27/2022 1045   NA 139 04/25/2021 0000   K 3.7 05/27/2022 1045   CL 107 05/27/2022 1045   CO2 23 05/27/2022 1045   GLUCOSE 130 (H) 05/27/2022 1045   BUN 21 05/27/2022 1045   BUN 16 01/30/2022 0000   CREATININE 0.68 05/27/2022 1045   CREATININE 0.78 09/20/2016 0923   CALCIUM 9.9 05/27/2022 1045   PROT 7.3 05/27/2022 1045   PROT 6.7  07/17/2017 0912   ALBUMIN 4.1 05/27/2022 1045   ALBUMIN 4.4 07/17/2017 0912   AST 17 05/27/2022 1045   ALT 13 05/27/2022 1045   ALKPHOS 48 05/27/2022 1045   BILITOT 0.8 05/27/2022 1045   BILITOT 0.5 07/17/2017 0912   GFRNONAA >  60 05/27/2022 1045   GFRNONAA 77 09/20/2016 0923   GFRAA >60 08/21/2019 1539   GFRAA 88 09/20/2016 0923     Diabetic Labs (most recent): Lab Results  Component Value Date   HGBA1C 8.5 06/19/2022   HGBA1C 9.3 01/30/2022   HGBA1C 8.9 08/15/2021   MICROALBUR 150 02/21/2021      Lab Results  Component Value Date   TSH 1.68 01/30/2022   TSH 1.62 07/24/2021   TSH 11.00 (A) 04/25/2021   TSH 19.240 (H) 07/17/2017   TSH 1.91 09/20/2016   TSH 3.05 06/14/2016   TSH 2.86 03/12/2016   TSH 4.280 04/04/2012   FREET4 1.53 07/17/2017   FREET4 1.5 09/20/2016   FREET4 1.6 06/14/2016   FREET4 1.5 03/12/2016     Assessment & Plan:   1) Uncontrolled type 2 diabetes mellitus with circulatory disorder, with long-term current use of insulin ([place])  - Gabrielle Wood has currently uncontrolled symptomatic type 2 DM since 76 years of age.  She presents today, accompanied by her sister, with her CGM and logs showing fluctuating glycemic profile but improved overall.  She does have some trouble getting her CGM sensor to stay on for the full 10 days.  Her POCT A1c today is 8.5%, improving from last visit of 9.3%.  She still has dips in glucose and subsequent highs due to treating lows.    -Recent labs reviewed.  - I had a long discussion with her about the progressive nature of diabetes and the pathology behind its complications. 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 These are all discussed in detail with her.  - Nutritional counseling repeated at each appointment due to patients tendency to fall back in to old  habits.  - The patient admits there is a room for improvement in their diet and drink choices. -  Suggestion is made for the patient to avoid simple carbohydrates from their diet including Cakes, Sweet Desserts / Pastries, Ice Cream, Soda (diet and regular), Sweet Tea, Candies, Chips, Cookies, Sweet Pastries, Store Bought Juices, Alcohol in Excess of 1-2 drinks a day, Artificial Sweeteners, Coffee Creamer, and "Sugar-free" Products. This will help patient to have stable blood glucose profile and potentially avoid unintended weight gain.   - I encouraged the patient to switch to unprocessed or minimally processed complex starch and increased protein intake (animal or plant source), fruits, and vegetables.   - Patient is advised to stick to a routine mealtimes to eat 3 meals a day and avoid unnecessary snacks (to snack only to correct hypoglycemia).  - I have approached her with the following individualized plan to manage  her diabetes and patient agrees:   -She is advised to lower her dose of Tresiba to 20 units SQ nightly and continue her Humalog 4-7 units TID with meals if glucose is above 90 and she is eating (Specific instructions on how to titrate insulin dosage based on glucose readings given to patient in writing).  She can continue lower dose of Metformin 500 mg po twice daily with meals (better tolerated than the full dose).   -she is encouraged to continue monitoring blood glucose 4 times daily (using her CGM), before meals and before bed, and to call the clinic if she has readings less than 70 or greater than 300 for 3 tests in a row.  - she is  warned not to take insulin without proper monitoring per orders. - Adjustment parameters are given to her for hypo and hyperglycemia in writing.  -She is not a candidate for incretin therapy due to body habitus.  - Specific targets for  A1c;  LDL, HDL,  and Triglycerides were discussed with the patient.  2) Blood Pressure /Hypertension: Her BP  is controlled to target.   she is advised to continue her current medications including Valsartan 80 mg p.o. daily with breakfast.  3)  Weight/Diet:  her Body mass index is 20.52 kg/m.  -  she is not a candidate for weight loss.  Exercise, and detailed carbohydrates information provided  -  detailed on discharge instructions.  4) Hypothyroidism- unspecified There are no recent TFTs to review.  She is advised to continue her current dose of Levothyroxine at 125 mcg po daily before breakfast (above her weight based max dose of 94 mcg). Will recheck TFTs prior to next visit.   - The correct intake of thyroid hormone (Levothyroxine, Synthroid), is on empty stomach first thing in the morning, with water, separated by at least 30 minutes from breakfast and other medications,  and separated by more than 4 hours from calcium, iron, multivitamins, acid reflux medications (PPIs).  - This medication is a life-long medication and will be needed to correct thyroid hormone imbalances for the rest of your life.  The dose may change from time to time, based on thyroid blood work.  - It is extremely important to be consistent taking this medication, near the same time each morning.  -AVOID TAKING PRODUCTS CONTAINING BIOTIN (commonly found in Hair, Skin, Nails vitamins) AS IT INTERFERES WITH THE VALIDITY OF THYROID FUNCTION BLOOD TESTS.   5) Chronic Care/Health Maintenance: -she is on ACEI/ARB and is encouraged to initiate and continue to follow up with Ophthalmology, Dentist, Podiatrist at least yearly or according to recommendations, and advised to stay away from smoking. I have recommended yearly flu vaccine and pneumonia vaccine at least every 5 years; moderate intensity exercise for up to 150 minutes weekly; and sleep for at least 7 hours a day.  - she is advised to maintain close follow up with [place]illion Nation, MD for primary care needs, as well as her other providers for optimal and coordinated  care.     I spent 38 minutes in the care of the patient today including review of labs from [place], Lipids, Thyroid Function, Hematology (current and previous including abstractions from other facilities); face-to-face time discussing  her blood glucose readings/logs, discussing hypoglycemia and hyperglycemia episodes and symptoms, medications doses, her options of short and long term treatment based on the latest standards of care / guidelines;  discussion about incorporating lifestyle medicine;  and documenting the encounter. Risk reduction counseling performed per USPSTF guidelines to reduce obesity and cardiovascular risk factors.     Please refer to Patient Instructions for Blood Glucose Monitoring and Insulin/Medications Dosing Guide"  in media tab for additional information. Please  also refer to " Patient Self Inventory" in the Media  tab for reviewed elements of pertinent patient history.  Dola Factor participated in the discussions, expressed understanding, and voiced agreement with the above plans.  All questions were answered to her satisfaction. she is encouraged to contact clinic should she have any questions or concerns prior to her return visit.   Follow up plan: - Return in about 4 months (around 10/20/2022) for Diabetes F/U with A1c in office, Previsit labs, Thyroid follow up,  Bring meter and logs.  Ronny Bacon, [facility] [address] [place], [place] [address] Phone: [phone] Fax: [phone]  06/19/2022, 4:09 PM

## 2022-07-02 ENCOUNTER — Telehealth: Payer: Self-pay | Admitting: Nurse Practitioner

## 2022-07-02 NOTE — Telephone Encounter (Signed)
Wants to know where her testing supply order was sent to. I didn't see anything scanned in or on the medlist

## 2022-07-03 NOTE — Telephone Encounter (Signed)
Pt was giving G7 sensor sample

## 2022-07-05 ENCOUNTER — Emergency Department (HOSPITAL_COMMUNITY)
Admission: EM | Admit: 2022-07-05 | Discharge: 2022-07-05 | Disposition: A | Discharge status: Home / Self Care | Payer: HMO | Attending: Student | Admitting: Student

## 2022-07-05 ENCOUNTER — Encounter (HOSPITAL_COMMUNITY): Payer: Self-pay

## 2022-07-05 ENCOUNTER — Emergency Department (HOSPITAL_COMMUNITY): Payer: HMO

## 2022-07-05 ENCOUNTER — Other Ambulatory Visit: Payer: Self-pay

## 2022-07-05 DIAGNOSIS — R1031 Right lower quadrant pain: Secondary | ICD-10-CM | POA: Diagnosis present

## 2022-07-05 DIAGNOSIS — E119 Type 2 diabetes mellitus without complications: Secondary | ICD-10-CM | POA: Insufficient documentation

## 2022-07-05 DIAGNOSIS — R1032 Left lower quadrant pain: Secondary | ICD-10-CM | POA: Diagnosis not present

## 2022-07-05 DIAGNOSIS — Z7982 Long term (current) use of aspirin: Secondary | ICD-10-CM | POA: Insufficient documentation

## 2022-07-05 DIAGNOSIS — Z7984 Long term (current) use of oral hypoglycemic drugs: Secondary | ICD-10-CM | POA: Diagnosis not present

## 2022-07-05 DIAGNOSIS — E871 Hypo-osmolality and hyponatremia: Secondary | ICD-10-CM | POA: Diagnosis not present

## 2022-07-05 DIAGNOSIS — Z79899 Other long term (current) drug therapy: Secondary | ICD-10-CM | POA: Insufficient documentation

## 2022-07-05 DIAGNOSIS — Z794 Long term (current) use of insulin: Secondary | ICD-10-CM | POA: Insufficient documentation

## 2022-07-05 DIAGNOSIS — R1030 Lower abdominal pain, unspecified: Secondary | ICD-10-CM

## 2022-07-05 LAB — CBC WITH DIFFERENTIAL/PLATELET
Abs Immature Granulocytes: 0.02 10*3/uL (ref 0.00–0.07)
Basophils Absolute: 0 10*3/uL (ref 0.0–0.1)
Basophils Relative: 1 %
Eosinophils Absolute: 0.1 10*3/uL (ref 0.0–0.5)
Eosinophils Relative: 1 %
HCT: 39.9 % (ref 36.0–46.0)
Hemoglobin: 13.1 g/dL (ref 12.0–15.0)
Immature Granulocytes: 0 %
Lymphocytes Relative: 24 %
Lymphs Abs: 1.5 10*3/uL (ref 0.7–4.0)
MCH: 30.5 pg (ref 26.0–34.0)
MCHC: 32.8 g/dL (ref 30.0–36.0)
MCV: 92.8 fL (ref 80.0–100.0)
Monocytes Absolute: 0.6 10*3/uL (ref 0.1–1.0)
Monocytes Relative: 9 %
Neutro Abs: 3.9 10*3/uL (ref 1.7–7.7)
Neutrophils Relative %: 65 %
Platelets: 172 10*3/uL (ref 150–400)
RBC: 4.3 MIL/uL (ref 3.87–5.11)
RDW: 14.4 % (ref 11.5–15.5)
WBC: 6.1 10*3/uL (ref 4.0–10.5)
nRBC: 0 % (ref 0.0–0.2)

## 2022-07-05 LAB — COMPREHENSIVE METABOLIC PANEL
ALT: 15 U/L (ref 0–44)
AST: 17 U/L (ref 15–41)
Albumin: 4 g/dL (ref 3.5–5.0)
Alkaline Phosphatase: 56 U/L (ref 38–126)
Anion gap: 4 — ABNORMAL LOW (ref 5–15)
BUN: 17 mg/dL (ref 8–23)
CO2: 27 mmol/L (ref 22–32)
Calcium: 9.5 mg/dL (ref 8.9–10.3)
Chloride: 101 mmol/L (ref 98–111)
Creatinine, Ser: 0.58 mg/dL (ref 0.44–1.00)
GFR, Estimated: >60 mL/min (ref >60)
Glucose, Bld: 311 mg/dL — ABNORMAL HIGH (ref 70–99)
Potassium: 4.3 mmol/L (ref 3.5–5.1)
Sodium: 132 mmol/L — ABNORMAL LOW (ref 135–145)
Total Bilirubin: 0.7 mg/dL (ref 0.3–1.2)
Total Protein: 7.1 g/dL (ref 6.5–8.1)

## 2022-07-05 LAB — CBG MONITORING, ED: Glucose-Capillary: 291 mg/dL — ABNORMAL HIGH (ref 70–99)

## 2022-07-05 LAB — LIPASE, BLOOD: Lipase: 39 U/L (ref 11–51)

## 2022-07-05 MED ORDER — DICYCLOMINE HCL 20 MG PO TABS: 20.0000 mg | ORAL_TABLET | Freq: Two times a day (BID) | ORAL | 0 refills | Status: DC

## 2022-07-05 MED ORDER — KETOROLAC TROMETHAMINE 15 MG/ML IJ SOLN
15.0000 mg | Freq: Once | INTRAMUSCULAR | Status: AC
  Administered 2022-07-05: 15 mg via INTRAVENOUS
  Filled 2022-07-05: qty 1

## 2022-07-05 MED ORDER — IOHEXOL 300 MG/ML  SOLN
100.0000 mL | Freq: Once | INTRAMUSCULAR | Status: AC | PRN
Start: 2022-07-05 — End: 2022-07-05
  Administered 2022-07-05: 100 mL via INTRAVENOUS

## 2022-07-05 NOTE — Discharge Instructions (Signed)
Please follow-up with your primary care doctor for further evaluation.  As we discussed, please stick to a clear liquid diet for the next few days or incorporate bananas, rice, applesauce, toast.  Slowly incorporate your regular diet after a few days.  Return to the emergency department for any worsening symptoms you might have.

## 2022-07-05 NOTE — ED Notes (Signed)
Pen pad does not work. Patient verbalized MSE

## 2022-07-05 NOTE — ED Provider Notes (Signed)
[facility] EMERGENCY DEPARTMENT Provider Note   CSN: [id number] Arrival date & time: 07/05/22  7829     History Chief Complaint  Patient presents with   Abdominal Pain    Gabrielle Wood is a 76 y.o. female with history of diverticulitis, type 2 diabetes who presents to the emergency department today for further evaluation of right lower quadrant abdominal pain that started around 4 AM abruptly.  Patient has been having associated nausea and diarrhea.  No melena or hematochezia.  No vomiting.  Patient was recently treated successfully for diverticulitis a month or 2 ago with Augmentin.  She states this feels similar.  Last colonoscopy was many years ago.  I reviewed the CT abdomen pelvis without contrast was performed on 05/25/2022 where it showed diverticulosis without any evidence of diverticulitis.  She was still treated with Augmentin at that time per chart review. She does report associated chills.    Abdominal Pain      Home Medications Prior to Admission medications   Medication Sig Start Date End Date Taking? Authorizing Provider  dicyclomine (BENTYL) 20 MG tablet Take 1 tablet (20 mg total) by mouth 2 (two) times daily. 07/05/22  Yes Lopaka Karge M, PA-C  ALPRAZolam Prudy Feeler) 1 MG tablet Take 0.5-1 mg by mouth. 0.5 tablet in am and 0.5 tab at night 04/11/21   [provider]  amLODipine (NORVASC) 5 MG tablet Take 5 mg by mouth daily. 04/20/21   [provider]  aspirin EC 81 MG tablet Take 81 mg by mouth daily. Swallow whole.    [provider]  atorvastatin (LIPITOR) 80 MG tablet Take 80 mg by mouth every evening. 11/24/21   [provider]  gabapentin (NEURONTIN) 300 MG capsule Take 300 mg by mouth 3 (three) times daily. 07/29/21   [provider]  insulin lispro (HUMALOG) 100 UNIT/ML KwikPen Inject 4-7 Units into the skin 3 (three) times daily. 06/19/22   Dani Gobble, NP  levocetirizine (XYZAL) 5 MG tablet Take 1 tablet (5 mg  total) by mouth every evening. 04/13/21   Eber Hong, MD  levothyroxine (SYNTHROID) 125 MCG tablet TAKE ONE TABLET BY MOUTH DAILY 05/09/22   Dani Gobble, NP  loperamide (IMODIUM) 2 MG capsule Take 2 mg by mouth 4 (four) times daily as needed. 06/28/21   [provider]  magnesium oxide (MAG-OX) 400 MG tablet Take by mouth. 11/21/21 11/21/22  [provider]  metFORMIN (GLUCOPHAGE) 500 MG tablet Take 1 tablet (500 mg total) by mouth 2 (two) times daily with a meal. 2 tabs bid Patient taking differently: Take 500 mg by mouth 2 (two) times daily with a meal. 11/01/21   Dani Gobble, NP  metoprolol succinate (TOPROL-XL) 50 MG 24 hr tablet Take 1 tablet (50 mg total) by mouth every evening. Take with or immediately following a meal. 10/17/21 12/08/21  Shahmehdi, Gemma Payor, MD  nitroGLYCERIN (NITROSTAT) 0.4 MG SL tablet Place 0.4 mg under the tongue every 5 (five) minutes as needed for chest pain.    [provider]  ondansetron (ZOFRAN-ODT) 4 MG disintegrating tablet Take 4 mg by mouth every 8 (eight) hours as needed. 06/27/21   [provider]  pantoprazole (PROTONIX) 40 MG tablet Take 40 mg by mouth daily. 06/06/21   [provider]  ticagrelor (BRILINTA) 90 MG TABS tablet Take 90 mg by mouth 2 (two) times daily.    [provider]  TRESIBA FLEXTOUCH 100 UNIT/ML FlexTouch Pen Inject 20 Units into  the skin at bedtime. 06/19/22   Dani Gobble, NP  valsartan (DIOVAN) 80 MG tablet Take 40 mg by mouth daily.    [provider]  vitamin C (ASCORBIC ACID) 500 MG tablet Take 500 mg by mouth daily.    [provider]  fexofenadine (ALLEGRA) 60 MG tablet Take by mouth.  04/13/21  [provider]      Allergies    Amlodipine, Ciprocin-fluocin-procin [fluocinolone], Lipitor [atorvastatin], Nebivolol, Nitrofuran derivatives, Lisinopril, and Red yeast rice [cholestin]    Review of Systems   Review of Systems   Gastrointestinal:  Positive for abdominal pain.  All other systems reviewed and are negative.   Physical Exam Updated Vital Signs BP (!) 151/79   Pulse 60   Temp 98 F (36.7 C) (Oral)   Resp (!) 21   Ht 5\' 6"  (1.676 m)   Wt 61.2 kg   SpO2 95%   BMI 21.79 kg/m  Physical Exam Vitals and nursing note reviewed.  Constitutional:      General: She is not in acute distress.    Appearance: Normal appearance.  HENT:     Head: Normocephalic and atraumatic.  Eyes:     General:        Right eye: No discharge.        Left eye: No discharge.  Cardiovascular:     Comments: Regular rate and rhythm.  S1/S2 are distinct without any evidence of murmur, rubs, or gallops.  Radial pulses are 2+ bilaterally.  Dorsalis pedis pulses are 2+ bilaterally.  No evidence of pedal edema. Pulmonary:     Comments: Clear to auscultation bilaterally.  Normal effort.  No respiratory distress.  No evidence of wheezes, rales, or rhonchi heard throughout. Abdominal:     General: Abdomen is flat. Bowel sounds are normal. There is no distension.     Tenderness: There is abdominal tenderness in the right lower quadrant, suprapubic area and left lower quadrant. There is no guarding or rebound.  Musculoskeletal:        General: Normal range of motion.     Cervical back: Neck supple.  Skin:    General: Skin is warm and dry.     Findings: No rash.  Neurological:     General: No focal deficit present.     Mental Status: She is alert.  Psychiatric:        Mood and Affect: Mood normal.        Behavior: Behavior normal.     ED Results / Procedures / Treatments   Labs (all labs ordered are listed, but only abnormal results are displayed) Labs Reviewed  COMPREHENSIVE METABOLIC PANEL - Abnormal; Notable for the following components:      Result Value   Sodium 132 (*)    Glucose, Bld 311 (*)    Anion gap 4 (*)    All other components within normal limits  CBG MONITORING, ED - Abnormal; Notable for the  following components:   Glucose-Capillary 291 (*)    All other components within normal limits  LIPASE, BLOOD  CBC WITH DIFFERENTIAL/PLATELET    EKG None  Radiology CT ABDOMEN PELVIS W CONTRAST  Result Date: 07/05/2022 CLINICAL DATA:  Right lower quadrant abdominal pain EXAM: CT ABDOMEN AND PELVIS WITH CONTRAST TECHNIQUE: Multidetector CT imaging of the abdomen and pelvis was performed using the standard protocol following bolus administration of intravenous contrast. RADIATION DOSE REDUCTION: This exam was performed according to the departmental dose-optimization program which includes automated exposure control,  adjustment of the mA and/or kV according to patient size and/or use of iterative reconstruction technique. CONTRAST:  OMNIPAQUE IOHEXOL 300 MG/ML  SOLN COMPARISON:  05/25/2022 stone study from [facility] [place] FINDINGS: Lower chest: 6 mm right lower lobe pulmonary nodule on 07/06. Just caudal to this, a right lower lobe pulmonary nodule measures 7 mm on 10/06. These are not readily apparent on 09/19/2019. Bibasilar scarring. Normal heart size without pericardial or pleural effusion. Tiny hiatal hernia. Right coronary artery calcification. Hepatobiliary: Normal liver. The gallbladder is partially contracted, but no calcified stones or acute inflammation or are seen. No biliary duct dilatation. Pancreas: Normal, without mass or ductal dilatation. Spleen: Normal in size, without focal abnormality. Adrenals/Urinary Tract: Normal adrenal glands. Bilateral well-circumscribed low-density renal lesions which are likely cysts at maximally 4.0 cm . In the absence of clinically indicated signs/symptoms require(s) no independent follow-up. No hydronephrosis. Normal urinary bladder. Stomach/Bowel: The proximal stomach and gastric body appear thick walled, including on images 16 through 31 of series 2. This area is underdistended. Scattered colonic diverticula. Normal terminal ileum. Normal appendix,  including on 62/2. Normal small bowel. Vascular/Lymphatic: Advanced aortic and branch vessel atherosclerosis. No abdominopelvic adenopathy. Reproductive: Uterine calcifications are likely related to small dystrophic fibroids. No adnexal mass. Other: No significant free fluid. Mild pelvic floor laxity. No free intraperitoneal air. Musculoskeletal: Osteopenia.  Lumbosacral spondylosis. IMPRESSION: 1. Normal appendix, without specific explanation for right lower quadrant pain. 2. Proximal gastric wall apparent thickening, possibly partially secondary to underdistention. Correlate with symptoms of gastritis. 3. Right lower lobe pulmonary nodules of maximally 7 mm, new since a chest CT of 09/18/2021. Recommend further evaluation with nonemergent dedicated chest CT. 4. Uterine fibroids 5.  Tiny hiatal hernia. 6. Coronary artery atherosclerosis. Aortic Atherosclerosis (ICD10-I70.0). Electronically Signed   By: Jeronimo Greaves M.D.   On: 07/05/2022 12:39    Procedures Procedures    Medications Ordered in ED Medications  ketorolac (TORADOL) 15 MG/ML injection 15 mg (15 mg Intravenous Given 07/05/22 1113)  iohexol (OMNIPAQUE) 300 MG/ML solution 100 mL (100 mLs Intravenous Contrast Given 07/05/22 1209)    ED Course/ Medical Decision Making/ A&P Clinical Course as of 07/05/22 1350  Thu Jul 05, 2022  1346 CBC with Differential Normal. [CF]  1346 Lipase, blood Normal. [CF]  1347 Comprehensive metabolic panel(!) Hyponatremia and elevated glucose.  Otherwise no other significant abnormalities.  Patient states that her sugars typically run in the mid 200s. [CF]  1347 CT ABDOMEN PELVIS W CONTRAST No evidence of appendicitis or diverticulitis.  I personally ordered and interpreted this image.  I do agree with the radiologist interpretation. [CF]  1347 On reevaluation, patient states she is feeling better after Toradol.  She has had no other episodes of nausea or diarrhea here in the emergency department.  I went over  all labs and imaging with the patient. [CF]    Clinical Course User Index [CF] Teressa Lower, PA-C                           Medical Decision Making Gabrielle Wood is a 76 y.o. female patient who presents to the emergency department today for further evaluation of lower abdominal pain.  Patient has had no surgeries on her abdomen.  Differential diagnosis includes recurrence of diverticulitis, appendicitis, kidney stone.  I did consider however do feel that cystitis or pyelonephritis is less likely considering the patient is not having any urinary symptoms.  Patient is  in no acute distress at this time.  We will get abdominal labs, CT imaging, and give her some Toradol for pain.  I will plan to reassess.  As mentioned ED course, patient is feeling better.  No signs of surgical abdomen today.  If patient continues to have diarrhea I instructed her to get over-the-counter Imodium.  I will prescribe Bentyl for abdominal cramping.  No identifiable cause of her lower abdominal pain today.  Labs are all reassuring.  I went over the labs and imaging with her at the bedside.  All questions or concerns addressed.  Patient comfortable going home.  Strict return precautions discussed.  Amount and/or Complexity of Data Reviewed External Data Reviewed: radiology and notes.    Details: Highlighted in HPI and MDM. Labs: ordered. Decision-making details documented in ED Course. Radiology: ordered. Decision-making details documented in ED Course.  Risk Prescription drug management.   Final Clinical Impression(s) / ED Diagnoses Final diagnoses:  Lower abdominal pain    Rx / DC Orders ED Discharge Orders          Ordered    dicyclomine (BENTYL) 20 MG tablet  2 times daily        07/05/22 1348              Honor [place], [place] 07/05/22 1350    Glendora Score, MD 07/05/22 2137

## 2022-07-05 NOTE — ED Triage Notes (Signed)
Patient states she woke up at 0400 this morning with abdominal pain and diarrhea. Patient states umbilical pain with weakness.

## 2022-07-05 NOTE — ED Triage Notes (Signed)
Dexcomn read 271 in triage with hx diverticulitis.

## 2022-07-09 ENCOUNTER — Telehealth: Payer: Self-pay | Admitting: Nurse Practitioner

## 2022-07-09 NOTE — Telephone Encounter (Signed)
Sent fax to [employer] requesting information on Dexcom prescription sent 

## 2022-07-09 NOTE — Telephone Encounter (Signed)
New message    Pt c/o medication issue:  1. Name of Medication: Dexcom G7  2. How are you currently taking this medication (dosage and times per day)?   3. Are you having a reaction (difficulty breathing--STAT)?   4. What is your medication issue? Received the freestyle libra 2, asking for a call back

## 2022-07-09 NOTE — Telephone Encounter (Signed)
Family member was called and given # to [employer]. They state that the Dexcom could not be sent due to not being able to reach Mrs [facility] for authorization.

## 2022-07-10 ENCOUNTER — Telehealth: Payer: Self-pay | Admitting: Nurse Practitioner

## 2022-07-10 NOTE — Telephone Encounter (Signed)
I called pt's sister [name] and she said she called [employer] and they were going to send her sensors but they sent her Josephine Igo instead. I gave her niece a 30 day supply of sensors to hold her over. [name] is going to check back with [employer] and Yazleemar's grandson.   [employer] [phone]  [name] said she gave [employer] her number instead.

## 2022-07-12 NOTE — Telephone Encounter (Signed)
Gabrielle Wood with [employer] called and said her supplies were shipped

## 2022-07-31 ENCOUNTER — Other Ambulatory Visit: Payer: Self-pay | Admitting: Nurse Practitioner

## 2022-08-20 IMAGING — DX DG RIBS W/ CHEST 3+V*L*
3 series · 3 of 3 positions shown · non-contrast
Comparison: CT 09/18/2021

CLINICAL DATA: Left flank pain, left posterior chest pain

EXAM:
LEFT RIBS AND CHEST - 3+ VIEW

[rib obl]
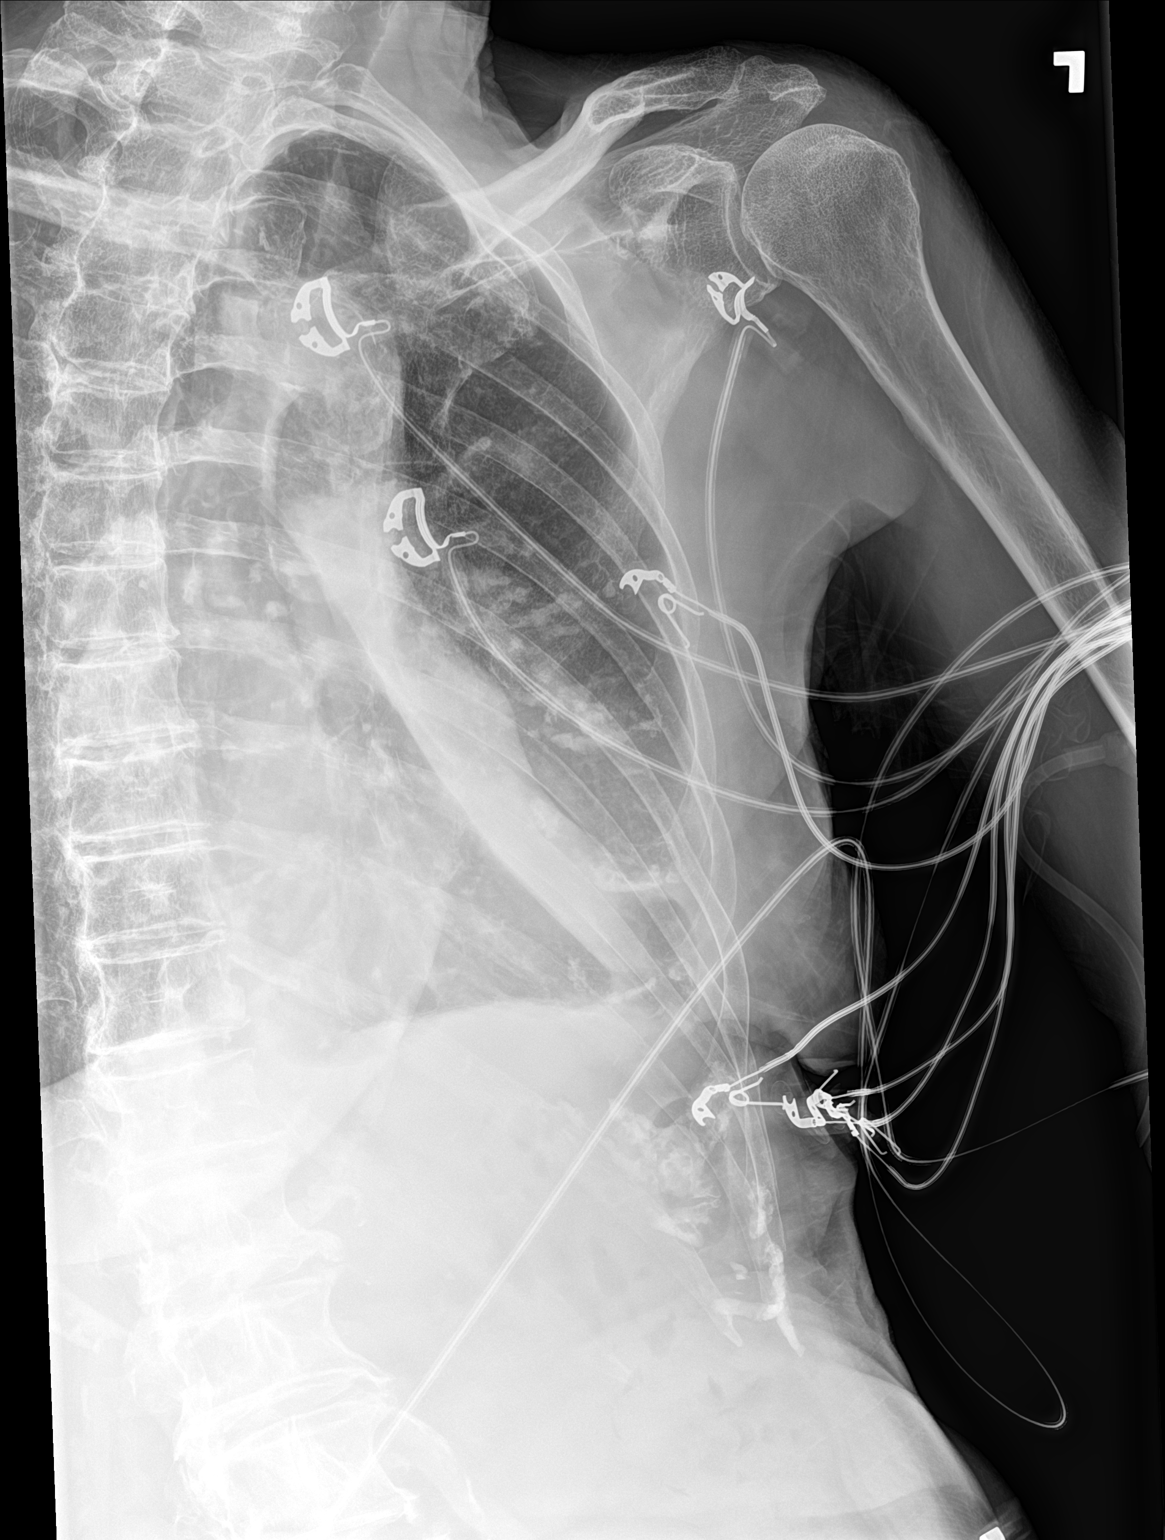

[chest ap]
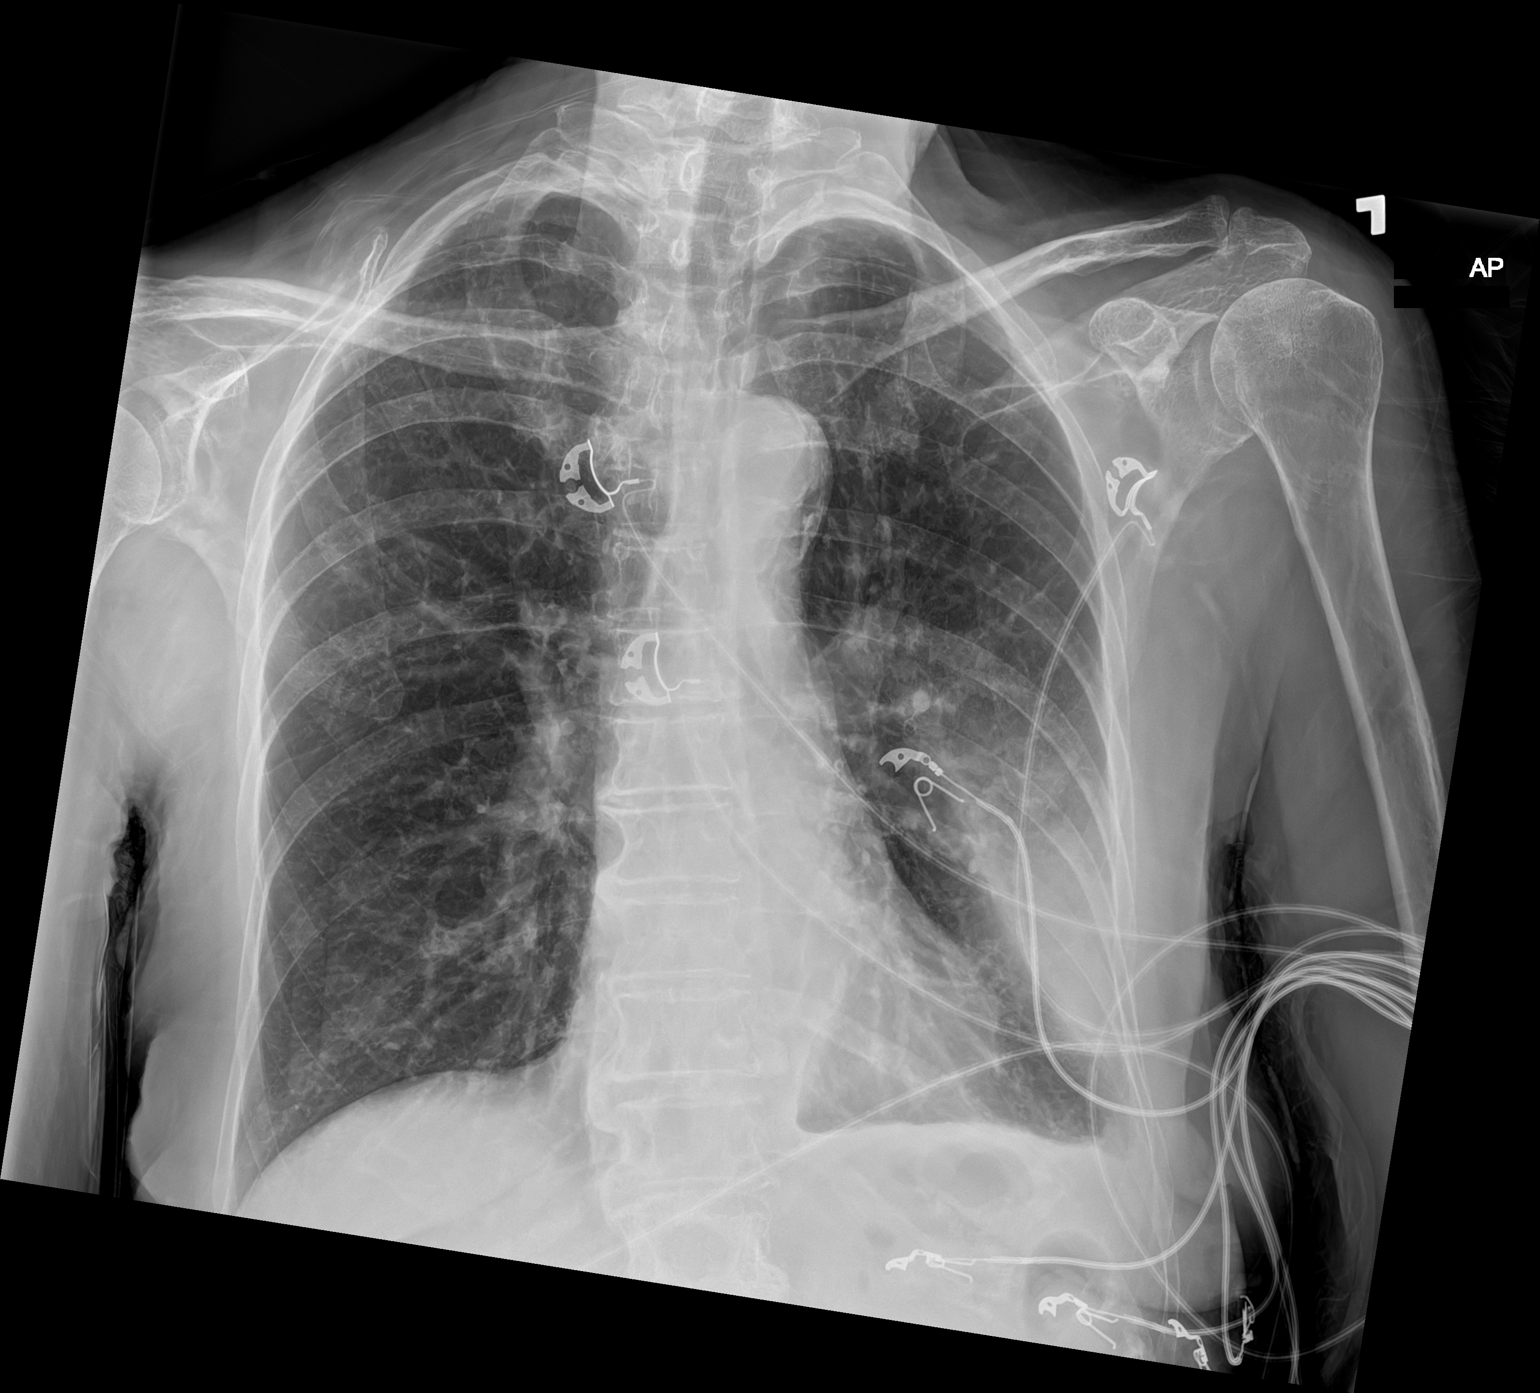

[rib ap]
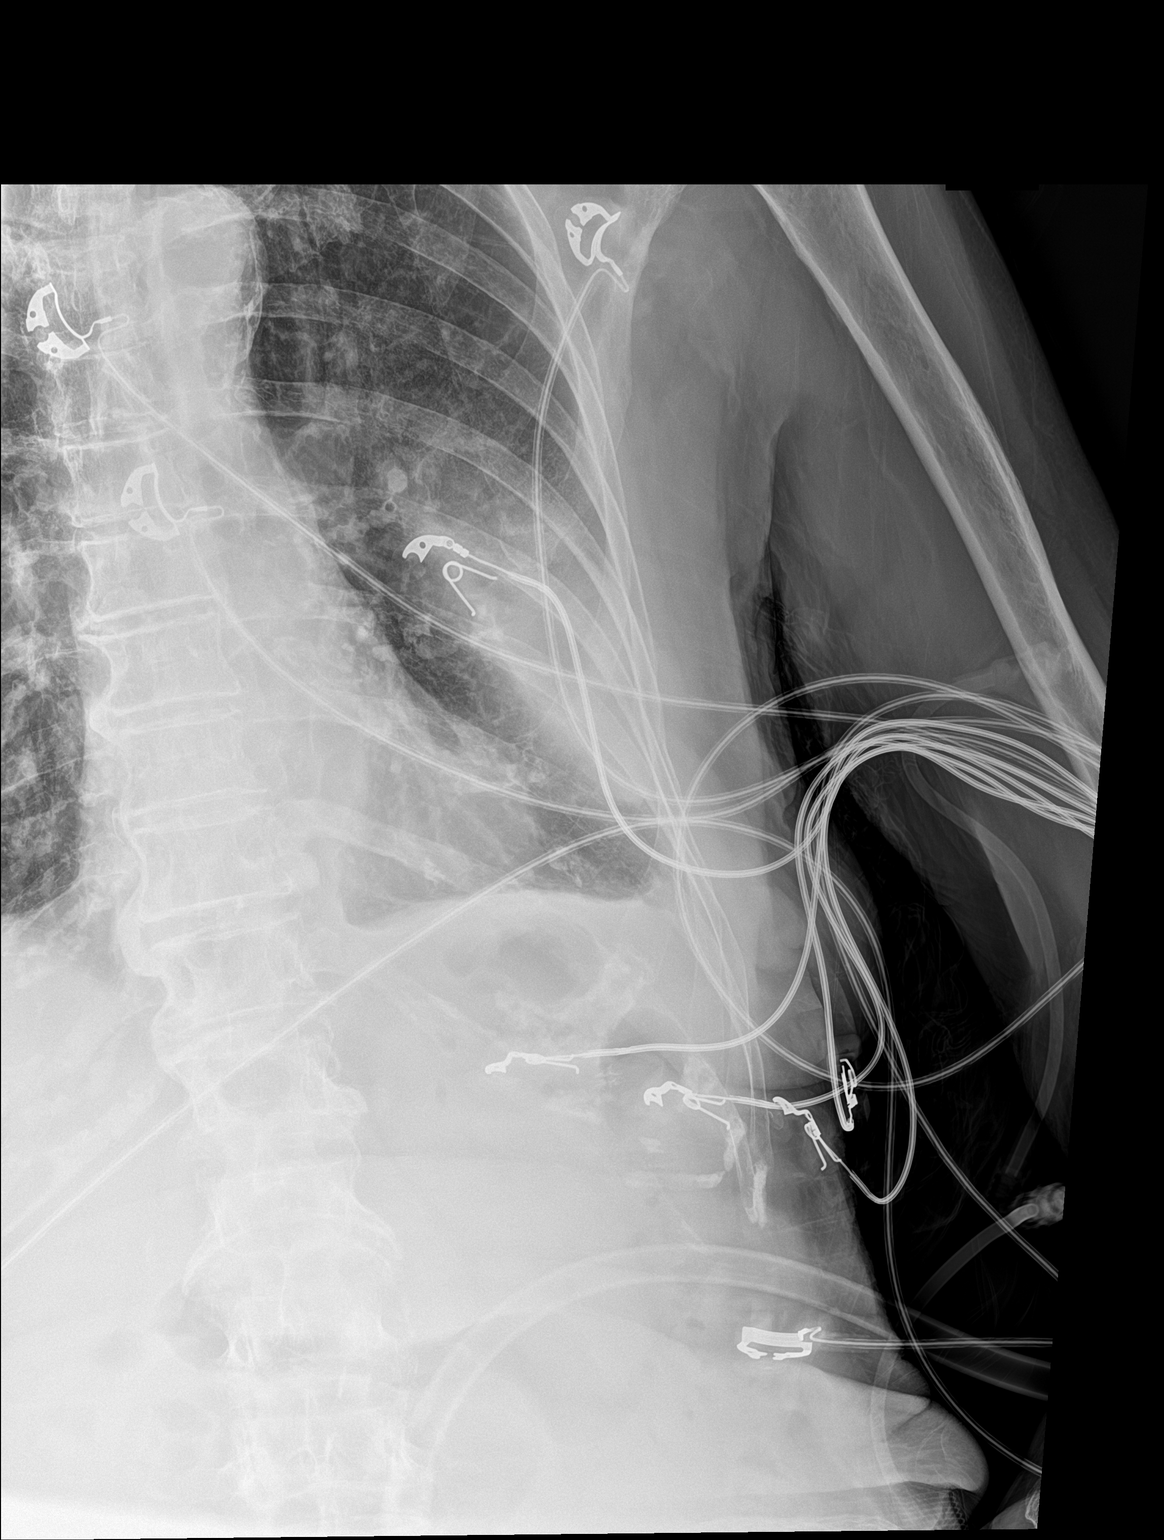

[3 of 3 positions shown; findings below may reference images not displayed]

FINDINGS: Small left pleural effusion has developed with wedge like opacity
within the peripheral left lung base likely representing fluid
within the fissure. The lungs are hyperinflated in keeping with
changes of underlying COPD. No pneumothorax. Cardiac size within
normal limits. Pulmonary vascularity is normal. No acute bone
abnormality. Specifically, no rib fracture or destructive rib lesion
identified.
IMPRESSION: Interval development of small left pleural effusion.

COPD

## 2022-08-21 IMAGING — CT CT ANGIO CHEST
2 of 6 series · 18 of 36 positions shown · IV contrast (Omnipaque or Isovue)
Comparison: None.

CLINICAL DATA: PE suspected, high prob.  Hypoxia.

EXAM:
CT ANGIOGRAPHY CHEST WITH CONTRAST
TECHNIQUE: Multidetector CT imaging of the chest was performed using the
standard protocol during bolus administration of intravenous
contrast. Multiplanar CT image reconstructions and MIPs were
obtained to evaluate the vascular anatomy.
CONTRAST:  100mL OMNIPAQUE IOHEXOL 350 MG/ML SOLN

[Series 5: pe axial thins · axial · 0.54mm/px · z∈[+1355,+1591]mm · 17 of 327 slices shown]
[im 16/327  lung]
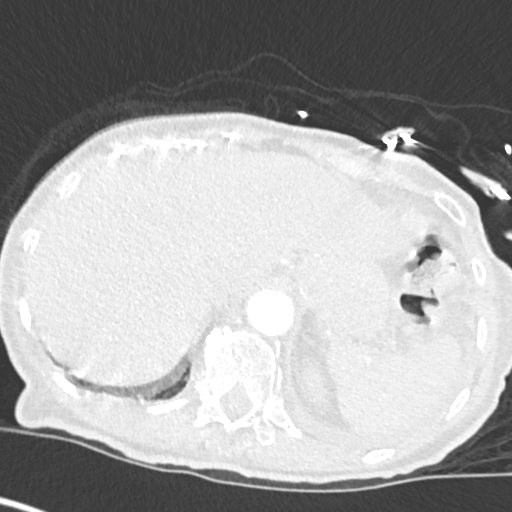
[im 32/327  mediastinal]
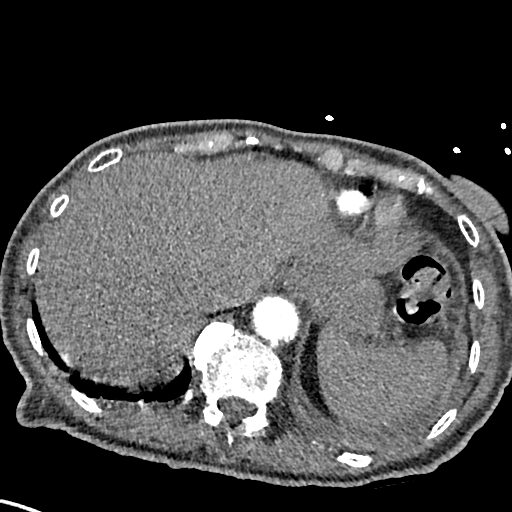
[im 47/327  lung]
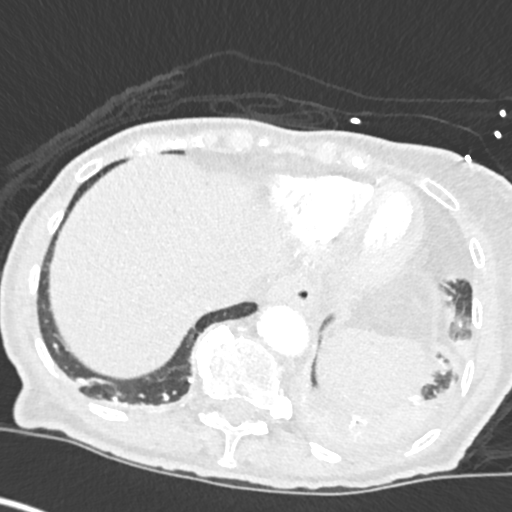
[im 78/327  mediastinal]
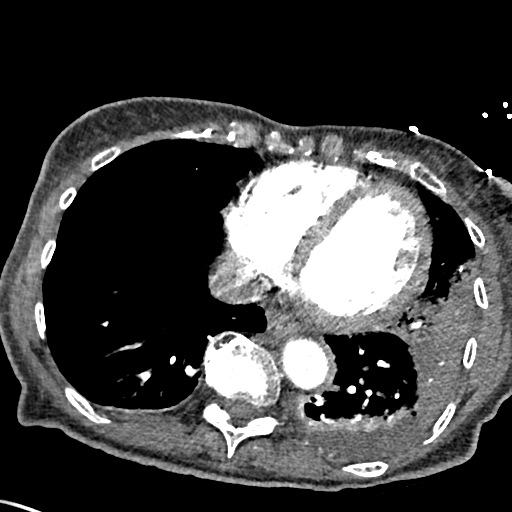
[im 94/327  lung]
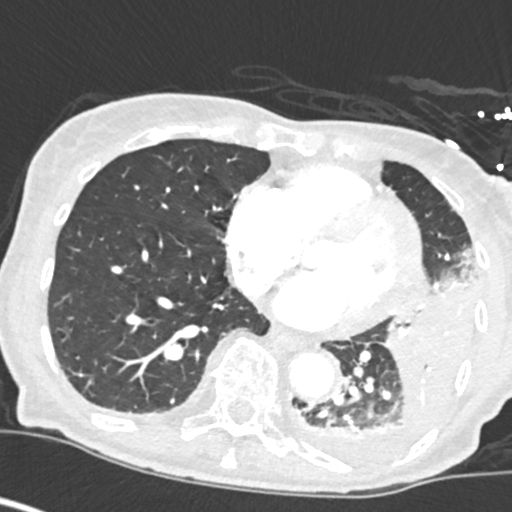
[im 109/327  mediastinal]
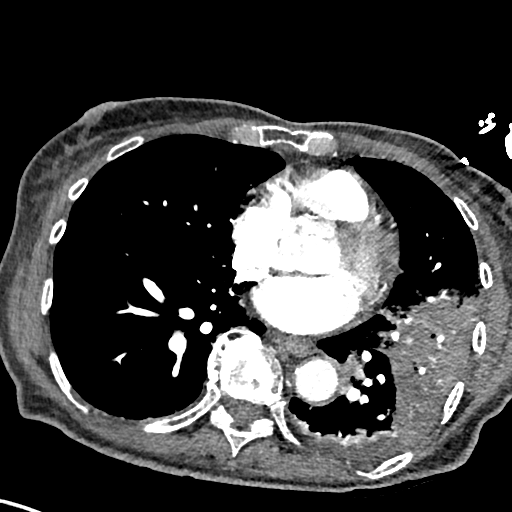
[im 125/327  lung]
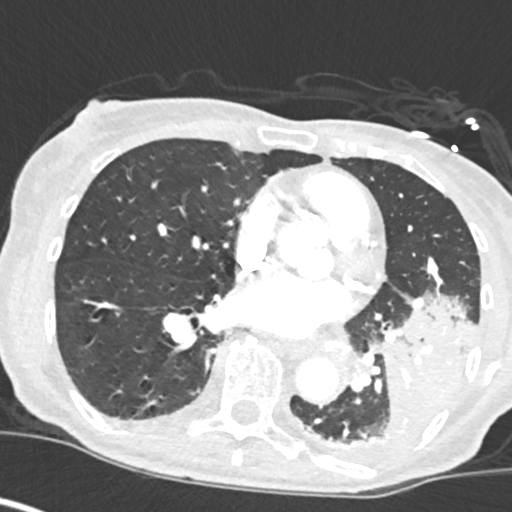
[im 140/327  mediastinal]
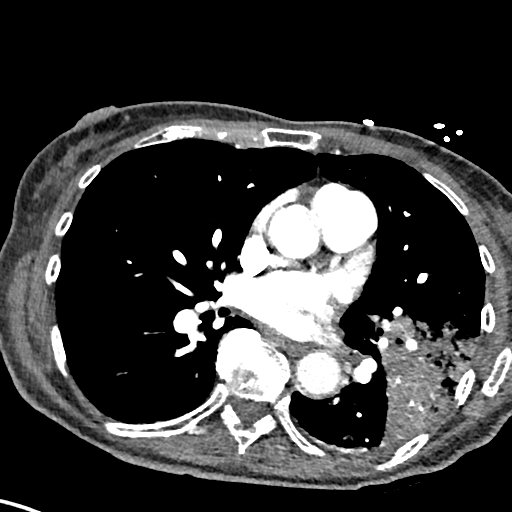
[im 171/327  lung]
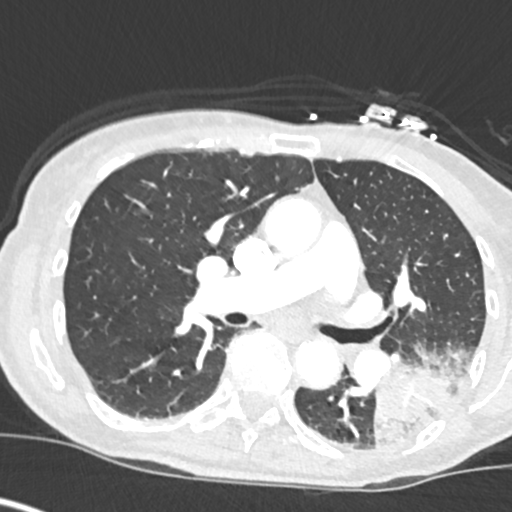
[im 187/327  mediastinal]
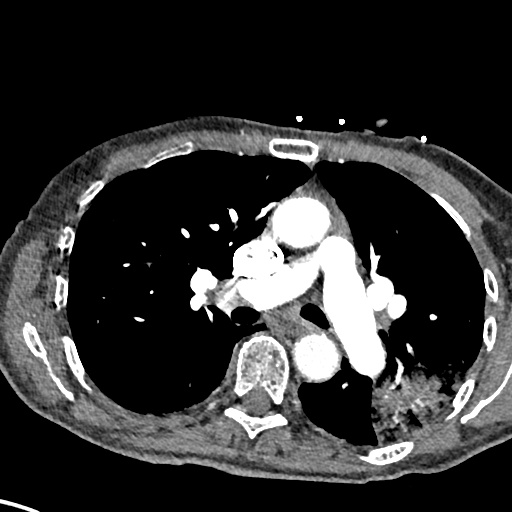
[im 202/327  lung]
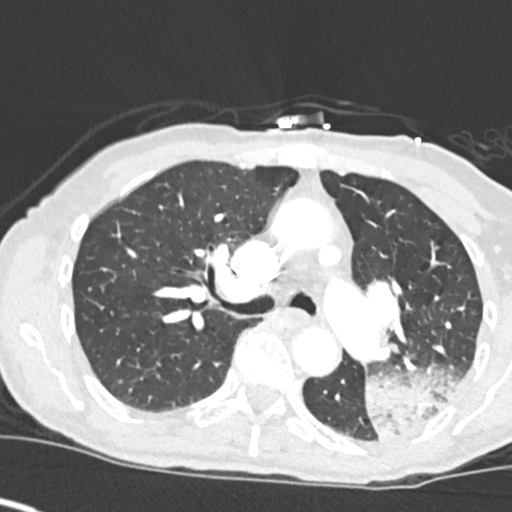
[im 218/327  mediastinal]
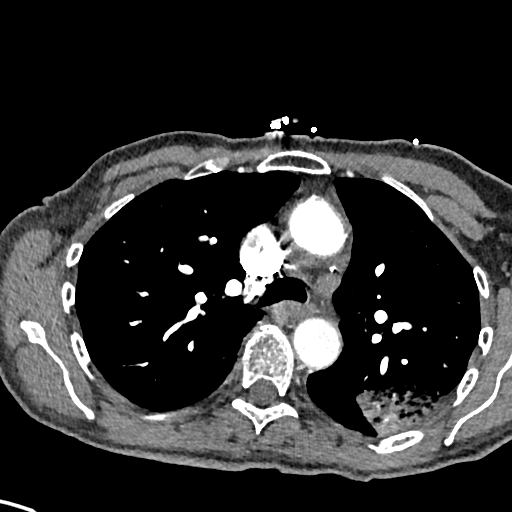
[im 233/327  lung]
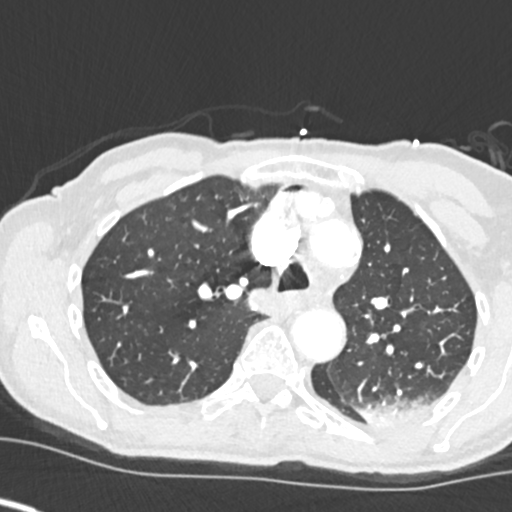
[im 249/327  mediastinal]
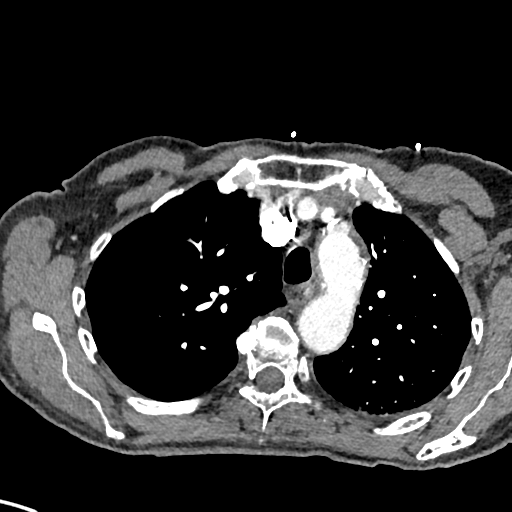
[im 280/327  lung]
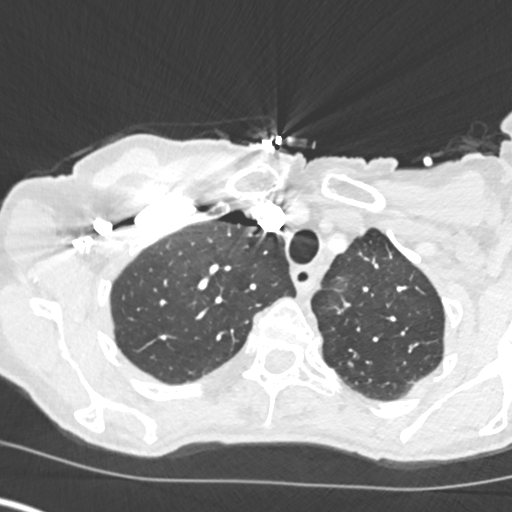
[im 295/327  mediastinal]
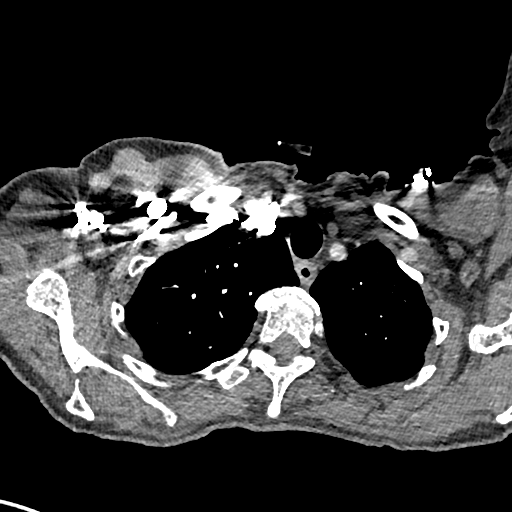
[im 311/327  lung]
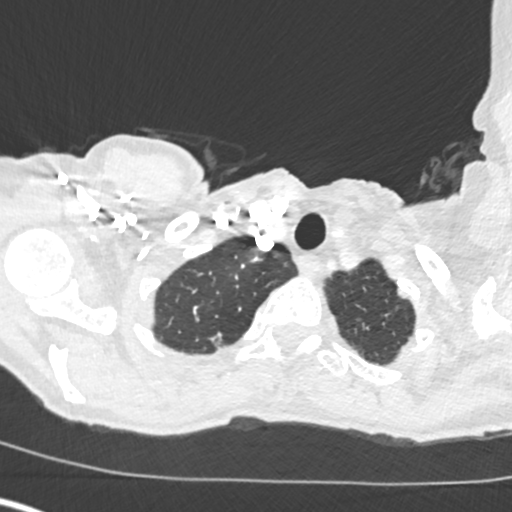

[Series 8: cor soft · coronal · 0.53mm/px · 1 of 93 slices shown]
[im 47/93  mediastinal]
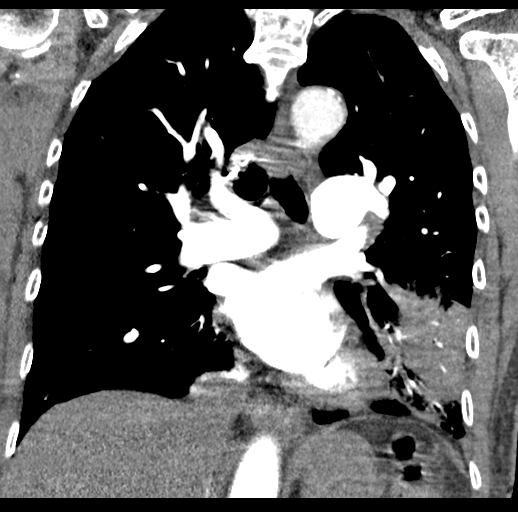

[18 of 36 positions shown; findings below may reference images not displayed]

FINDINGS: Cardiovascular: Adequate opacification of the pulmonary arterial
tree. No intraluminal filling defect identified to suggest acute
pulmonary embolism. The central pulmonary arteries are of normal
caliber. Extensive multi-vessel coronary artery calcification.
Global cardiac size within normal limits. No pericardial effusion.
Mild atherosclerotic calcification within the thoracic aorta. No
aortic aneurysm.

Mediastinum/Nodes: Shotty aortopulmonary window lymphadenopathy may
be reactive in nature. No frankly pathologic thoracic adenopathy
identified. Esophagus is unremarkable.

Lungs/Pleura: There is dense consolidation within the lingula, in
keeping with acute lobar pneumonia in the appropriate clinical
setting. Scattered additional infiltrate noted within the left upper
lobe. Small left pleural effusion is present, possibly representing
a small parapneumonic effusion. Mild associated left lower lobe
compressive atelectasis. Right lung is clear. No pneumothorax. No
pleural effusion on the right. There is airway impaction segmentally
within the left lower lobe centrally.

Upper Abdomen: No acute abnormality.

Musculoskeletal: No acute bone abnormality. No lytic or blastic bone
lesion. Osseous structures are age-appropriate.

Review of the MIP images confirms the above findings.
IMPRESSION: Dense consolidation within the lingula compatible with acute lobar
pneumonia in the appropriate clinical setting. Small associated left
pleural effusion may represent a parapneumonic effusion.

No pulmonary embolism.

Extensive multi-vessel coronary artery calcification.

Aortic Atherosclerosis ([id number]).

## 2022-10-24 ENCOUNTER — Other Ambulatory Visit: Payer: Self-pay | Admitting: Nurse Practitioner

## 2022-10-31 ENCOUNTER — Ambulatory Visit: Payer: PPO | Admitting: Nurse Practitioner

## 2022-11-13 ENCOUNTER — Ambulatory Visit: Payer: PPO | Admitting: Nurse Practitioner

## 2022-11-29 ENCOUNTER — Ambulatory Visit: Payer: PPO | Admitting: Nurse Practitioner

## 2022-11-29 DIAGNOSIS — E039 Hypothyroidism, unspecified: Secondary | ICD-10-CM

## 2022-11-29 DIAGNOSIS — Z794 Long term (current) use of insulin: Secondary | ICD-10-CM

## 2022-11-29 DIAGNOSIS — E559 Vitamin D deficiency, unspecified: Secondary | ICD-10-CM

## 2022-11-30 ENCOUNTER — Telehealth: Payer: Self-pay | Admitting: Nurse Practitioner

## 2022-11-30 NOTE — Telephone Encounter (Signed)
No, she is not connected to share data.  Like has something to do with her recent illness.

## 2022-11-30 NOTE — Telephone Encounter (Signed)
Pt's sugar is running high. Can you pull her readings?

## 2022-12-04 DIAGNOSIS — R918 Other nonspecific abnormal finding of lung field: Secondary | ICD-10-CM | POA: Diagnosis not present

## 2022-12-04 DIAGNOSIS — J181 Lobar pneumonia, unspecified organism: Secondary | ICD-10-CM | POA: Diagnosis not present

## 2022-12-04 DIAGNOSIS — I2510 Atherosclerotic heart disease of native coronary artery without angina pectoris: Secondary | ICD-10-CM | POA: Diagnosis not present

## 2022-12-04 DIAGNOSIS — I252 Old myocardial infarction: Secondary | ICD-10-CM | POA: Diagnosis not present

## 2022-12-04 DIAGNOSIS — Z955 Presence of coronary angioplasty implant and graft: Secondary | ICD-10-CM | POA: Diagnosis not present

## 2022-12-04 DIAGNOSIS — Z96651 Presence of right artificial knee joint: Secondary | ICD-10-CM | POA: Diagnosis not present

## 2022-12-04 DIAGNOSIS — Z7982 Long term (current) use of aspirin: Secondary | ICD-10-CM | POA: Diagnosis not present

## 2022-12-04 DIAGNOSIS — J441 Chronic obstructive pulmonary disease with (acute) exacerbation: Secondary | ICD-10-CM | POA: Diagnosis not present

## 2022-12-04 DIAGNOSIS — E079 Disorder of thyroid, unspecified: Secondary | ICD-10-CM | POA: Diagnosis not present

## 2022-12-04 DIAGNOSIS — Z7984 Long term (current) use of oral hypoglycemic drugs: Secondary | ICD-10-CM | POA: Diagnosis not present

## 2022-12-04 DIAGNOSIS — E119 Type 2 diabetes mellitus without complications: Secondary | ICD-10-CM | POA: Diagnosis not present

## 2022-12-04 DIAGNOSIS — I10 Essential (primary) hypertension: Secondary | ICD-10-CM | POA: Diagnosis not present

## 2022-12-04 DIAGNOSIS — Z87891 Personal history of nicotine dependence: Secondary | ICD-10-CM | POA: Diagnosis not present

## 2022-12-04 DIAGNOSIS — I700 Atherosclerosis of aorta: Secondary | ICD-10-CM | POA: Diagnosis not present

## 2022-12-04 DIAGNOSIS — J440 Chronic obstructive pulmonary disease with acute lower respiratory infection: Secondary | ICD-10-CM | POA: Diagnosis not present

## 2022-12-04 DIAGNOSIS — R531 Weakness: Secondary | ICD-10-CM | POA: Diagnosis not present

## 2022-12-04 DIAGNOSIS — Z7902 Long term (current) use of antithrombotics/antiplatelets: Secondary | ICD-10-CM | POA: Diagnosis not present

## 2022-12-04 DIAGNOSIS — Z794 Long term (current) use of insulin: Secondary | ICD-10-CM | POA: Diagnosis not present

## 2022-12-04 DIAGNOSIS — E785 Hyperlipidemia, unspecified: Secondary | ICD-10-CM | POA: Diagnosis not present

## 2022-12-06 ENCOUNTER — Other Ambulatory Visit: Payer: Self-pay | Admitting: Nurse Practitioner

## 2022-12-06 DIAGNOSIS — E1110 Type 2 diabetes mellitus with ketoacidosis without coma: Secondary | ICD-10-CM | POA: Diagnosis not present

## 2022-12-06 DIAGNOSIS — E1165 Type 2 diabetes mellitus with hyperglycemia: Secondary | ICD-10-CM | POA: Diagnosis not present

## 2022-12-13 DIAGNOSIS — R599 Enlarged lymph nodes, unspecified: Secondary | ICD-10-CM | POA: Diagnosis not present

## 2022-12-13 DIAGNOSIS — R918 Other nonspecific abnormal finding of lung field: Secondary | ICD-10-CM | POA: Diagnosis not present

## 2022-12-26 DIAGNOSIS — Z6820 Body mass index (BMI) 20.0-20.9, adult: Secondary | ICD-10-CM | POA: Diagnosis not present

## 2022-12-26 DIAGNOSIS — F17210 Nicotine dependence, cigarettes, uncomplicated: Secondary | ICD-10-CM | POA: Diagnosis not present

## 2022-12-26 DIAGNOSIS — M549 Dorsalgia, unspecified: Secondary | ICD-10-CM | POA: Diagnosis not present

## 2022-12-26 DIAGNOSIS — M25559 Pain in unspecified hip: Secondary | ICD-10-CM | POA: Diagnosis not present

## 2022-12-26 DIAGNOSIS — R911 Solitary pulmonary nodule: Secondary | ICD-10-CM | POA: Diagnosis not present

## 2023-01-02 ENCOUNTER — Ambulatory Visit: Payer: HMO | Admitting: Nurse Practitioner

## 2023-01-02 ENCOUNTER — Encounter: Payer: Self-pay | Admitting: Nurse Practitioner

## 2023-01-02 VITALS — BP 114/74 | HR 71 | Ht 67.0 in | Wt 126.6 lb

## 2023-01-02 DIAGNOSIS — E559 Vitamin D deficiency, unspecified: Secondary | ICD-10-CM | POA: Diagnosis not present

## 2023-01-02 DIAGNOSIS — E039 Hypothyroidism, unspecified: Secondary | ICD-10-CM | POA: Diagnosis not present

## 2023-01-02 DIAGNOSIS — Z794 Long term (current) use of insulin: Secondary | ICD-10-CM | POA: Diagnosis not present

## 2023-01-02 DIAGNOSIS — E11649 Type 2 diabetes mellitus with hypoglycemia without coma: Secondary | ICD-10-CM

## 2023-01-02 NOTE — Progress Notes (Signed)
Endocrinology Follow Up Note       01/02/2023, 4:03 PM   Subjective:    Patient ID: Gabrielle Wood, female    DOB: 01-Nov-1946.  Gabrielle Wood is being seen in follow up after being seen in consultation for management of currently uncontrolled symptomatic diabetes requested by  [place], MD.   Past Medical History:  Diagnosis Date   Arthritis    COPD (chronic obstructive pulmonary disease) ([place])    Diabetes mellitus    DVT (deep venous thrombosis) ([place])    years ago   GERD (gastroesophageal reflux disease)    H. pylori infection 2013   S/p treatment with amoxicillin, Biaxin, PPI   High cholesterol    Hypertension    Hypothyroidism    NSTEMI (non-ST elevated myocardial infarction) ([place]) 12/2020   s/p  PCI DES to the LCx   Thyroid disease     Past Surgical History:  Procedure Laterality Date   BACK SURGERY     lumbar x3    ESOPHAGOGASTRODUODENOSCOPY  04/04/2012   Moderate gastritis/MODERATE Duodenitis in the duodenal bulb duodenum.  Pathology with chronic H. pylori gastritis.   EUS  05/14/2012   Normal pancreas, gallbladder,biliary tree/ Incidental finding of simple-appearing right renal cyst.   TONSILLECTOMY     TUBAL LIGATION     VEIN SURGERY     stripping years ago but recent laser surgery; bilateral legs    Social History   Socioeconomic History   Marital status: Widowed    Spouse name: Not on file   Number of children: 2   Years of education: Not on file   Highest education level: Not on file  Occupational History   Occupation: retired, Secretary/administrator at [employer]  Tobacco Use   Smoking status: Former    Packs/day: 0.50    Types: Cigarettes   Smokeless tobacco: Never  Vaping Use   Vaping Use: Never used  Substance and Sexual Activity   Alcohol use: No   Drug use: No   Sexual activity: Not on file  Other Topics Concern   Not on file  Social History Narrative   Not on file    Social Determinants of Health   Financial Resource Strain: Not on file  Food Insecurity: Not on file  Transportation Needs: Not on file  Physical Activity: Not on file  Stress: Not on file  Social Connections: Not on file    Family History  Problem Relation Age of Onset   Pancreatic cancer Mother        diagnosed at age 28s, died from heart attack   Inflammatory bowel disease Brother        crohn's, his dgt deceased from colon cancer at age 75   Colon cancer Neg Hx     Outpatient Encounter Medications as of 01/02/2023  Medication Sig   ALPRAZolam (XANAX) 1 MG tablet Take 0.5-1 mg by mouth. 0.5 tablet in am and 0.5 tab at night   amLODipine (NORVASC) 5 MG tablet Take 5 mg by mouth daily.   aspirin EC 81 MG tablet Take 81 mg by mouth daily. Swallow whole.   atorvastatin (LIPITOR) 80 MG tablet Take 80 mg by mouth  every evening.   dicyclomine (BENTYL) 20 MG tablet Take 1 tablet (20 mg total) by mouth 2 (two) times daily.   gabapentin (NEURONTIN) 300 MG capsule Take 300 mg by mouth 3 (three) times daily.   insulin lispro (HUMALOG KWIKPEN) 100 UNIT/ML KwikPen Inject 4-7 Units into the skin 3 (three) times daily with meals.   levocetirizine (XYZAL) 5 MG tablet Take 1 tablet (5 mg total) by mouth every evening.   levothyroxine (SYNTHROID) 125 MCG tablet TAKE ONE TABLET BY MOUTH DAILY   loperamide (IMODIUM) 2 MG capsule Take 2 mg by mouth 4 (four) times daily as needed.   metFORMIN (GLUCOPHAGE) 500 MG tablet Take 1 tablet (500 mg total) by mouth 2 (two) times daily with a meal. 2 tabs bid (Patient taking differently: Take 500 mg by mouth 2 (two) times daily with a meal.)   nitroGLYCERIN (NITROSTAT) 0.4 MG SL tablet Place 0.4 mg under the tongue every 5 (five) minutes as needed for chest pain.   ondansetron (ZOFRAN-ODT) 4 MG disintegrating tablet Take 4 mg by mouth every 8 (eight) hours as needed.   pantoprazole (PROTONIX) 40 MG tablet Take 40 mg by mouth daily.   ticagrelor (BRILINTA) 90  MG TABS tablet Take 90 mg by mouth 2 (two) times daily.   TRESIBA FLEXTOUCH 100 UNIT/ML FlexTouch Pen Inject 20 Units into the skin at bedtime.   valsartan (DIOVAN) 80 MG tablet Take 40 mg by mouth daily.   vitamin C (ASCORBIC ACID) 500 MG tablet Take 500 mg by mouth daily.   metoprolol succinate (TOPROL-XL) 50 MG 24 hr tablet Take 1 tablet (50 mg total) by mouth every evening. Take with or immediately following a meal.   [DISCONTINUED] fexofenadine (ALLEGRA) 60 MG tablet Take by mouth.   No facility-administered encounter medications on file as of 01/02/2023.    ALLERGIES: Allergies  Allergen Reactions   Amlodipine    Ciprocin-Fluocin-Procin [Fluocinolone]     Nightmares   Lipitor [Atorvastatin] Other (See Comments)    Muscle aches, pain   Nebivolol    Nitrofuran Derivatives    Lisinopril Cough   Red Yeast Rice [Cholestin] Itching and Rash    VACCINATION STATUS: Immunization History  Administered Date(s) Administered   Pneumococcal Polysaccharide-23 04/04/2012    Diabetes She presents for her follow-up diabetic visit. She has type 2 diabetes mellitus. Onset time: Was diagnosed at approx age of 36. Her disease course has been fluctuating. There are no hypoglycemic associated symptoms. Associated symptoms include fatigue, foot paresthesias and weight loss. Pertinent negatives for diabetes include no polydipsia and no polyuria. There are no hypoglycemic complications. Symptoms are improving. Diabetic complications include heart disease and peripheral neuropathy. (Had a MI in the past, recently hospitalized for DKA) Risk factors for coronary artery disease include diabetes mellitus, dyslipidemia, hypertension, post-menopausal and sedentary lifestyle. Current diabetic treatment includes oral agent (monotherapy) and intensive insulin program. She is compliant with treatment most of the time. Her weight is fluctuating minimally. She is following a generally healthy diet. When asked about meal  planning, she reported none. She has not had a previous visit with a dietitian (Has appt with Kieth Brightly after me today). She participates in exercise intermittently. Her home blood glucose trend is decreasing steadily. Her overall blood glucose range is >200 mg/dl. (She presents today, accompanied by her sister, with her CGM and logs showing fluctuating glycemic profile but improved overall.  She has been sick several times since last visit with respiratory illnesses requiring steroids to help her recover which  caused her glucose readings to significantly elevate.  Analysis of her CGM shows TIR 43%, TAR 56%, TBR <2% with a GMI of 8.2%.  She just finished her last prednisone yesterday.) An ACE inhibitor/angiotensin II receptor blocker is being taken. She does not see a podiatrist.Eye exam is current.  Hypertension This is a chronic problem. The current episode started more than 1 year ago. The problem has been resolved since onset. The problem is controlled. Agents associated with hypertension include thyroid hormones. Risk factors for coronary artery disease include diabetes mellitus, dyslipidemia, post-menopausal state and sedentary lifestyle. Past treatments include angiotensin blockers, calcium channel blockers and beta blockers. The current treatment provides significant improvement. There are no compliance problems.  Hypertensive end-organ damage includes CAD/MI. Identifiable causes of hypertension include a thyroid problem.     Review of systems  Constitutional: + decreasing body weight,  current Body mass index is 19.83 kg/m. , + fatigue- deconditioned, no subjective hyperthermia, no subjective hypothermia Eyes: no blurry vision, no xerophthalmia ENT: no sore throat, no nodules palpated in throat, no dysphagia/odynophagia, no hoarseness Cardiovascular: no chest pain, no shortness of breath, no palpitations, no leg swelling Respiratory: no cough, no shortness of breath Gastrointestinal: no  nausea/vomiting/diarrhea Musculoskeletal: no muscle/joint aches Skin: no rashes, no hyperemia Neurological: no tremors, + numbness/tingling to BLE, no dizziness Psychiatric: no depression, no anxiety  Objective:     BP 114/74 (BP Location: Right Arm, Patient Position: Sitting, Cuff Size: Normal)   Pulse 71   Ht 5\' 7"  (1.702 m)   Wt 126 lb 9.6 oz (57.4 kg)   BMI 19.83 kg/m   Wt Readings from Last 3 Encounters:  01/02/23 126 lb 9.6 oz (57.4 kg)  07/05/22 135 lb (61.2 kg)  06/19/22 131 lb (59.4 kg)     BP Readings from Last 3 Encounters:  01/02/23 114/74  07/05/22 (!) 157/93  06/19/22 123/76      Physical Exam- Limited  Constitutional:  Body mass index is 19.83 kg/m. , not in acute distress, normal state of mind Eyes:  EOMI, no exophthalmos Neck: Supple Musculoskeletal: no gross deformities, strength intact in all four extremities, no gross restriction of joint movements, walks with cane Skin:  no rashes, no hyperemia Neurological: no tremor with outstretched hands   Diabetic Foot Exam - Simple   No data filed      CMP ( most recent) CMP     Component Value Date/Time   NA 132 (L) 07/05/2022 1100   NA 139 04/25/2021 0000   K 4.3 07/05/2022 1100   CL 101 07/05/2022 1100   CO2 27 07/05/2022 1100   GLUCOSE 311 (H) 07/05/2022 1100   BUN 17 07/05/2022 1100   BUN 16 01/30/2022 0000   CREATININE 0.58 07/05/2022 1100   CREATININE 0.78 09/20/2016 0923   CALCIUM 9.5 07/05/2022 1100   PROT 7.1 07/05/2022 1100   PROT 6.7 07/17/2017 0912   ALBUMIN 4.0 07/05/2022 1100   ALBUMIN 4.4 07/17/2017 0912   AST 17 07/05/2022 1100   ALT 15 07/05/2022 1100   ALKPHOS 56 07/05/2022 1100   BILITOT 0.7 07/05/2022 1100   BILITOT 0.5 07/17/2017 0912   GFRNONAA >60 07/05/2022 1100   GFRNONAA 77 09/20/2016 0923   GFRAA >60 08/21/2019 1539   GFRAA 88 09/20/2016 0923     Diabetic Labs (most recent): Lab Results  Component Value Date   HGBA1C 8.5 06/19/2022   HGBA1C 9.3  01/30/2022   HGBA1C 8.9 08/15/2021   MICROALBUR 150 02/21/2021  Lab Results  Component Value Date   TSH 1.68 01/30/2022   TSH 1.62 07/24/2021   TSH 11.00 (A) 04/25/2021   TSH 19.240 (H) 07/17/2017   TSH 1.91 09/20/2016   TSH 3.05 06/14/2016   TSH 2.86 03/12/2016   TSH 4.280 04/04/2012   FREET4 1.53 07/17/2017   FREET4 1.5 09/20/2016   FREET4 1.6 06/14/2016   FREET4 1.5 03/12/2016     Assessment & Plan:   1) Uncontrolled type 2 diabetes mellitus with circulatory disorder, with long-term current use of insulin (HCC)  - Nicollette Wilhelmi Bansal has currently uncontrolled symptomatic type 2 DM since 77 years of age.  She presents today, accompanied by her sister, with her CGM and logs showing fluctuating glycemic profile but improved overall.  She has been sick several times since last visit with respiratory illnesses requiring steroids to help her recover which caused her glucose readings to significantly elevate.  Analysis of her CGM shows TIR 43%, TAR 56%, TBR <2% with a GMI of 8.2%.  She just finished her last prednisone yesterday.  -Recent labs reviewed.  - I had a long discussion with her about the progressive nature of diabetes and the pathology behind its complications. 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 These are all discussed in detail with her.  - Nutritional counseling repeated at each appointment due to patients tendency to fall back in to old habits.  - The patient admits there is a room for improvement in their diet and drink choices. -  Suggestion is made for the patient to avoid simple carbohydrates from their diet including Cakes, Sweet Desserts / Pastries, Ice Cream, Soda (diet and regular), Sweet Tea, Candies, Chips, Cookies, Sweet Pastries, Store Bought Juices, Alcohol in Excess of 1-2 drinks a day, Artificial  Sweeteners, Coffee Creamer, and "Sugar-free" Products. This will help patient to have stable blood glucose profile and potentially avoid unintended weight gain.   - I encouraged the patient to switch to unprocessed or minimally processed complex starch and increased protein intake (animal or plant source), fruits, and vegetables.   - Patient is advised to stick to a routine mealtimes to eat 3 meals a day and avoid unnecessary snacks (to snack only to correct hypoglycemia).  - I have approached her with the following individualized plan to manage  her diabetes and patient agrees:   -She is advised to continue her dose of Tresiba 20 units SQ nightly and adjust her Humalog to 4-10 units (being more aggressive with the sliding scale to help prevent postprandial hyperglycemia) if glucose is above 90 and she is eating (Specific instructions on how to titrate insulin dosage based on glucose readings given to patient in writing). She can continue Metformin 500 mg po twice daily with meals (better tolerated than the full dose).   -she is encouraged to continue monitoring blood glucose 4 times daily (using her CGM), before meals and before bed, and to call the clinic if she has readings less than 70 or greater than 300 for 3 tests in a row.  - she is warned not to take insulin without proper monitoring per orders. - Adjustment parameters are given to her for hypo and hyperglycemia in writing.  -She is not a candidate for incretin therapy due to body habitus.  - Specific targets for  A1c;  LDL, HDL,  and Triglycerides were  discussed with the patient.  2) Blood Pressure /Hypertension: Her BP is controlled to target.   she is advised to continue her current medications including Valsartan 80 mg p.o. daily with breakfast.  3)  Weight/Diet:  her Body mass index is 19.83 kg/m.  -  she is not a candidate for weight loss.  Exercise, and detailed carbohydrates information provided  -  detailed on discharge  instructions.  4) Hypothyroidism- unspecified Her previsit thyroid function tests are consistent with appropriate hormone replacement.  She is advised to continue her current dose of Levothyroxine at 125 mcg po daily before breakfast (above her weight based max dose of 94 mcg). Likely has absorption issue.   - The correct intake of thyroid hormone (Levothyroxine, Synthroid), is on empty stomach first thing in the morning, with water, separated by at least 30 minutes from breakfast and other medications,  and separated by more than 4 hours from calcium, iron, multivitamins, acid reflux medications (PPIs).  - This medication is a life-long medication and will be needed to correct thyroid hormone imbalances for the rest of your life.  The dose may change from time to time, based on thyroid blood work.  - It is extremely important to be consistent taking this medication, near the same time each morning.  -AVOID TAKING PRODUCTS CONTAINING BIOTIN (commonly found in Hair, Skin, Nails vitamins) AS IT INTERFERES WITH THE VALIDITY OF THYROID FUNCTION BLOOD TESTS.   5) Chronic Care/Health Maintenance: -she is on ACEI/ARB and is encouraged to initiate and continue to follow up with Ophthalmology, Dentist, Podiatrist at least yearly or according to recommendations, and advised to stay away from smoking. I have recommended yearly flu vaccine and pneumonia vaccine at least every 5 years; moderate intensity exercise for up to 150 minutes weekly; and sleep for at least 7 hours a day.  - she is advised to maintain close follow up with [name], MD for primary care needs, as well as her other providers for optimal and coordinated care.     I spent  20  minutes in the care of the patient today including review of labs from CMP, Lipids, Thyroid Function, Hematology (current and previous including abstractions from other facilities); face-to-face time discussing  her blood glucose readings/logs, discussing  hypoglycemia and hyperglycemia episodes and symptoms, medications doses, her options of short and long term treatment based on the latest standards of care / guidelines;  discussion about incorporating lifestyle medicine;  and documenting the encounter. Risk reduction counseling performed per USPSTF guidelines to reduce obesity and cardiovascular risk factors.     Please refer to Patient Instructions for Blood Glucose Monitoring and Insulin/Medications Dosing Guide"  in media tab for additional information. Please  also refer to " Patient Self Inventory" in the Media  tab for reviewed elements of pertinent patient history.  Chauncey Fischer participated in the discussions, expressed understanding, and voiced agreement with the above plans.  All questions were answered to her satisfaction. she is encouraged to contact clinic should she have any questions or concerns prior to her return visit.   Follow up plan: - Return in about 3 months (around 04/02/2023) for Diabetes F/U with A1c in office, No previsit labs, Bring meter and logs.  Ronny Bacon, [facility] [address] [place], [place] [address] Phone: [phone] Fax: [phone]  01/02/2023, 4:03 PM

## 2023-01-02 NOTE — Patient Instructions (Signed)

## 2023-01-03 DIAGNOSIS — Z794 Long term (current) use of insulin: Secondary | ICD-10-CM | POA: Diagnosis not present

## 2023-01-03 DIAGNOSIS — E1159 Type 2 diabetes mellitus with other circulatory complications: Secondary | ICD-10-CM | POA: Diagnosis not present

## 2023-01-03 DIAGNOSIS — E7849 Other hyperlipidemia: Secondary | ICD-10-CM | POA: Diagnosis not present

## 2023-01-03 DIAGNOSIS — Z862 Personal history of diseases of the blood and blood-forming organs and certain disorders involving the immune mechanism: Secondary | ICD-10-CM | POA: Diagnosis not present

## 2023-01-03 DIAGNOSIS — I10 Essential (primary) hypertension: Secondary | ICD-10-CM | POA: Diagnosis not present

## 2023-01-06 DIAGNOSIS — E1165 Type 2 diabetes mellitus with hyperglycemia: Secondary | ICD-10-CM | POA: Diagnosis not present

## 2023-01-06 DIAGNOSIS — E1110 Type 2 diabetes mellitus with ketoacidosis without coma: Secondary | ICD-10-CM | POA: Diagnosis not present

## 2023-01-07 DIAGNOSIS — M549 Dorsalgia, unspecified: Secondary | ICD-10-CM | POA: Diagnosis not present

## 2023-01-07 DIAGNOSIS — F17210 Nicotine dependence, cigarettes, uncomplicated: Secondary | ICD-10-CM | POA: Diagnosis not present

## 2023-01-07 DIAGNOSIS — M25559 Pain in unspecified hip: Secondary | ICD-10-CM | POA: Diagnosis not present

## 2023-01-07 DIAGNOSIS — Z6820 Body mass index (BMI) 20.0-20.9, adult: Secondary | ICD-10-CM | POA: Diagnosis not present

## 2023-01-07 DIAGNOSIS — R030 Elevated blood-pressure reading, without diagnosis of hypertension: Secondary | ICD-10-CM | POA: Diagnosis not present

## 2023-01-08 DIAGNOSIS — R300 Dysuria: Secondary | ICD-10-CM | POA: Diagnosis not present

## 2023-01-10 DIAGNOSIS — E785 Hyperlipidemia, unspecified: Secondary | ICD-10-CM | POA: Diagnosis not present

## 2023-01-10 DIAGNOSIS — I2510 Atherosclerotic heart disease of native coronary artery without angina pectoris: Secondary | ICD-10-CM | POA: Diagnosis not present

## 2023-01-10 DIAGNOSIS — I10 Essential (primary) hypertension: Secondary | ICD-10-CM | POA: Diagnosis not present

## 2023-01-15 ENCOUNTER — Other Ambulatory Visit: Payer: Self-pay | Admitting: Nurse Practitioner

## 2023-01-22 DIAGNOSIS — M549 Dorsalgia, unspecified: Secondary | ICD-10-CM | POA: Diagnosis not present

## 2023-01-22 DIAGNOSIS — M25559 Pain in unspecified hip: Secondary | ICD-10-CM | POA: Diagnosis not present

## 2023-01-22 DIAGNOSIS — Z6820 Body mass index (BMI) 20.0-20.9, adult: Secondary | ICD-10-CM | POA: Diagnosis not present

## 2023-01-22 DIAGNOSIS — F17210 Nicotine dependence, cigarettes, uncomplicated: Secondary | ICD-10-CM | POA: Diagnosis not present

## 2023-01-22 DIAGNOSIS — R030 Elevated blood-pressure reading, without diagnosis of hypertension: Secondary | ICD-10-CM | POA: Diagnosis not present

## 2023-02-04 DIAGNOSIS — E1110 Type 2 diabetes mellitus with ketoacidosis without coma: Secondary | ICD-10-CM | POA: Diagnosis not present

## 2023-02-04 DIAGNOSIS — E1165 Type 2 diabetes mellitus with hyperglycemia: Secondary | ICD-10-CM | POA: Diagnosis not present

## 2023-02-25 ENCOUNTER — Telehealth: Payer: Self-pay | Admitting: Nurse Practitioner

## 2023-02-25 NOTE — Telephone Encounter (Signed)
Stated they were needing a PA for dexcom 7 sensors.  Can you advise if you have received that? Pt is almost out

## 2023-02-27 ENCOUNTER — Other Ambulatory Visit (HOSPITAL_COMMUNITY): Payer: Self-pay

## 2023-02-28 ENCOUNTER — Other Ambulatory Visit (HOSPITAL_COMMUNITY): Payer: Self-pay

## 2023-02-28 MED ORDER — FREESTYLE LIBRE 3 SENSOR MISC: 3 refills | Status: DC

## 2023-02-28 MED ORDER — FREESTYLE LIBRE 3 READER DEVI: 1.0000 | Freq: Once | 0 refills | Status: AC

## 2023-02-28 NOTE — Telephone Encounter (Signed)
Will you call patient and let her know that we will need to switch to [place] 3 based on insurance pref?  I will go ahead and send it in to [facility].

## 2023-02-28 NOTE — Addendum Note (Signed)
Addended by: Brita Romp on: 02/28/2023 03:49 PM   Modules accepted: Orders

## 2023-02-28 NOTE — Telephone Encounter (Signed)
Pharmacy Patient Advocate Encounter  Received notification from Pt calls msgs that prior authorization for Dexcom G7 sensor is required/requested.  Per Test Claim: Libre CGM  preferred by the ins.  It's $0   Please advise if suggested medication is appropriate.

## 2023-02-28 NOTE — Telephone Encounter (Signed)
I have called both the patient's cell and landline. Both phones just rang and rang. There was no voicemail.

## 2023-03-07 DIAGNOSIS — E1165 Type 2 diabetes mellitus with hyperglycemia: Secondary | ICD-10-CM | POA: Diagnosis not present

## 2023-03-07 DIAGNOSIS — E1110 Type 2 diabetes mellitus with ketoacidosis without coma: Secondary | ICD-10-CM | POA: Diagnosis not present

## 2023-03-18 DIAGNOSIS — E7801 Familial hypercholesterolemia: Secondary | ICD-10-CM | POA: Diagnosis not present

## 2023-03-18 DIAGNOSIS — E1169 Type 2 diabetes mellitus with other specified complication: Secondary | ICD-10-CM | POA: Diagnosis not present

## 2023-03-18 DIAGNOSIS — Z862 Personal history of diseases of the blood and blood-forming organs and certain disorders involving the immune mechanism: Secondary | ICD-10-CM | POA: Diagnosis not present

## 2023-03-18 DIAGNOSIS — E559 Vitamin D deficiency, unspecified: Secondary | ICD-10-CM | POA: Diagnosis not present

## 2023-03-18 DIAGNOSIS — E7849 Other hyperlipidemia: Secondary | ICD-10-CM | POA: Diagnosis not present

## 2023-03-18 DIAGNOSIS — E039 Hypothyroidism, unspecified: Secondary | ICD-10-CM | POA: Diagnosis not present

## 2023-03-25 DIAGNOSIS — E7801 Familial hypercholesterolemia: Secondary | ICD-10-CM | POA: Diagnosis not present

## 2023-03-25 DIAGNOSIS — F32A Depression, unspecified: Secondary | ICD-10-CM | POA: Diagnosis not present

## 2023-03-25 DIAGNOSIS — E1169 Type 2 diabetes mellitus with other specified complication: Secondary | ICD-10-CM | POA: Diagnosis not present

## 2023-03-25 DIAGNOSIS — Z0001 Encounter for general adult medical examination with abnormal findings: Secondary | ICD-10-CM | POA: Diagnosis not present

## 2023-03-25 DIAGNOSIS — Z23 Encounter for immunization: Secondary | ICD-10-CM | POA: Diagnosis not present

## 2023-03-25 DIAGNOSIS — Z862 Personal history of diseases of the blood and blood-forming organs and certain disorders involving the immune mechanism: Secondary | ICD-10-CM | POA: Diagnosis not present

## 2023-03-25 DIAGNOSIS — I10 Essential (primary) hypertension: Secondary | ICD-10-CM | POA: Diagnosis not present

## 2023-03-25 DIAGNOSIS — M5416 Radiculopathy, lumbar region: Secondary | ICD-10-CM | POA: Diagnosis not present

## 2023-03-25 DIAGNOSIS — E039 Hypothyroidism, unspecified: Secondary | ICD-10-CM | POA: Diagnosis not present

## 2023-03-25 DIAGNOSIS — R599 Enlarged lymph nodes, unspecified: Secondary | ICD-10-CM | POA: Diagnosis not present

## 2023-03-25 DIAGNOSIS — F17210 Nicotine dependence, cigarettes, uncomplicated: Secondary | ICD-10-CM | POA: Diagnosis not present

## 2023-03-25 DIAGNOSIS — E1165 Type 2 diabetes mellitus with hyperglycemia: Secondary | ICD-10-CM | POA: Diagnosis not present

## 2023-04-02 ENCOUNTER — Ambulatory Visit: Payer: HMO | Admitting: Nurse Practitioner

## 2023-04-06 DIAGNOSIS — E1110 Type 2 diabetes mellitus with ketoacidosis without coma: Secondary | ICD-10-CM | POA: Diagnosis not present

## 2023-04-06 DIAGNOSIS — E1165 Type 2 diabetes mellitus with hyperglycemia: Secondary | ICD-10-CM | POA: Diagnosis not present

## 2023-04-09 ENCOUNTER — Ambulatory Visit (INDEPENDENT_AMBULATORY_CARE_PROVIDER_SITE_OTHER): Payer: PPO | Admitting: Nurse Practitioner

## 2023-04-09 ENCOUNTER — Encounter: Payer: Self-pay | Admitting: Nurse Practitioner

## 2023-04-09 ENCOUNTER — Other Ambulatory Visit: Payer: Self-pay | Admitting: Nurse Practitioner

## 2023-04-09 VITALS — BP 110/70 | HR 67 | Ht 67.0 in | Wt 128.0 lb

## 2023-04-09 DIAGNOSIS — E559 Vitamin D deficiency, unspecified: Secondary | ICD-10-CM | POA: Diagnosis not present

## 2023-04-09 DIAGNOSIS — E11649 Type 2 diabetes mellitus with hypoglycemia without coma: Secondary | ICD-10-CM | POA: Diagnosis not present

## 2023-04-09 DIAGNOSIS — E039 Hypothyroidism, unspecified: Secondary | ICD-10-CM

## 2023-04-09 DIAGNOSIS — Z794 Long term (current) use of insulin: Secondary | ICD-10-CM | POA: Diagnosis not present

## 2023-04-09 LAB — POCT GLYCOSYLATED HEMOGLOBIN (HGB A1C): Hemoglobin A1C: 9.4 % — AB (ref 4.0–5.6)

## 2023-04-09 MED ORDER — TRESIBA FLEXTOUCH 100 UNIT/ML ~~LOC~~ SOPN: 20.0000 [IU] | PEN_INJECTOR | Freq: Every day | SUBCUTANEOUS | 3 refills | Status: DC

## 2023-04-09 MED ORDER — INSULIN LISPRO (1 UNIT DIAL) 100 UNIT/ML (KWIKPEN): 6.0000 [IU] | PEN_INJECTOR | Freq: Three times a day (TID) | SUBCUTANEOUS | 3 refills | Status: DC

## 2023-04-09 MED ORDER — FREESTYLE LIBRE 3 READER DEVI: 1.0000 | Freq: Once | 0 refills | Status: AC

## 2023-04-09 MED ORDER — FREESTYLE LIBRE 3 SENSOR MISC: 3 refills | Status: DC

## 2023-04-09 NOTE — Progress Notes (Signed)
Endocrinology Follow Up Note       04/09/2023, 11:14 AM   Subjective:    Patient ID: Gabrielle Wood, female    DOB: 07-23-1946.  Gabrielle Wood is being seen in follow up after being seen in consultation for management of currently uncontrolled symptomatic diabetes requested by  [name], MD.   Past Medical History:  Diagnosis Date   Arthritis    COPD (chronic obstructive pulmonary disease) (HCC)    Diabetes mellitus    DVT (deep venous thrombosis) (HCC)    years ago   GERD (gastroesophageal reflux disease)    H. pylori infection 2013   S/p treatment with amoxicillin, Biaxin, PPI   High cholesterol    Hypertension    Hypothyroidism    NSTEMI (non-ST elevated myocardial infarction) (HCC) 12/2020   s/p  PCI DES to the LCx   Thyroid disease     Past Surgical History:  Procedure Laterality Date   BACK SURGERY     lumbar x3    ESOPHAGOGASTRODUODENOSCOPY  04/04/2012   Moderate gastritis/MODERATE Duodenitis in the duodenal bulb duodenum.  Pathology with chronic H. pylori gastritis.   EUS  05/14/2012   Normal pancreas, gallbladder,biliary tree/ Incidental finding of simple-appearing right renal cyst.   TONSILLECTOMY     TUBAL LIGATION     VEIN SURGERY     stripping years ago but recent laser surgery; bilateral legs    Social History   Socioeconomic History   Marital status: Widowed    Spouse name: Not on file   Number of children: 2   Years of education: Not on file   Highest education level: Not on file  Occupational History   Occupation: retired, Advertising copywriter at [employer]  Tobacco Use   Smoking status: Former    Packs/day: .5    Types: Cigarettes   Smokeless tobacco: Never  Vaping Use   Vaping Use: Never used  Substance and Sexual Activity   Alcohol use: No   Drug use: No   Sexual activity: Not on file  Other Topics Concern   Not on file  Social History Narrative   Not on file    Social Determinants of Health   Financial Resource Strain: Not on file  Food Insecurity: Not on file  Transportation Needs: Not on file  Physical Activity: Not on file  Stress: Not on file  Social Connections: Not on file    Family History  Problem Relation Age of Onset   Pancreatic cancer Mother        diagnosed at age 47s, died from heart attack   Inflammatory bowel disease Brother        crohn's, his dgt deceased from colon cancer at age 61   Colon cancer Neg Hx     Outpatient Encounter Medications as of 04/09/2023  Medication Sig   ALPRAZolam (XANAX) 1 MG tablet Take 0.5-1 mg by mouth. 0.5 tablet in am and 0.5 tab at night   amLODipine (NORVASC) 5 MG tablet Take 5 mg by mouth daily.   aspirin EC 81 MG tablet Take 81 mg by mouth daily. Swallow whole.   atorvastatin (LIPITOR) 80 MG tablet Take 80 mg by mouth  every evening.   Continuous Glucose Receiver (FREESTYLE LIBRE 3 READER) DEVI 1 Device by Does not apply route once for 1 dose.   dicyclomine (BENTYL) 20 MG tablet Take 1 tablet (20 mg total) by mouth 2 (two) times daily.   gabapentin (NEURONTIN) 300 MG capsule Take 300 mg by mouth 3 (three) times daily.   levocetirizine (XYZAL) 5 MG tablet Take 1 tablet (5 mg total) by mouth every evening.   levothyroxine (SYNTHROID) 125 MCG tablet TAKE ONE TABLET BY MOUTH DAILY   loperamide (IMODIUM) 2 MG capsule Take 2 mg by mouth 4 (four) times daily as needed.   metFORMIN (GLUCOPHAGE) 500 MG tablet Take 1 tablet (500 mg total) by mouth 2 (two) times daily with a meal. 2 tabs bid (Patient taking differently: Take 500 mg by mouth 2 (two) times daily with a meal.)   nitroGLYCERIN (NITROSTAT) 0.4 MG SL tablet Place 0.4 mg under the tongue every 5 (five) minutes as needed for chest pain.   ondansetron (ZOFRAN-ODT) 4 MG disintegrating tablet Take 4 mg by mouth every 8 (eight) hours as needed.   pantoprazole (PROTONIX) 40 MG tablet Take 40 mg by mouth daily.   ticagrelor (BRILINTA) 90 MG TABS  tablet Take 90 mg by mouth 2 (two) times daily.   valsartan (DIOVAN) 80 MG tablet Take 40 mg by mouth daily.   vitamin C (ASCORBIC ACID) 500 MG tablet Take 500 mg by mouth daily.   [DISCONTINUED] Continuous Blood Gluc Sensor (FREESTYLE LIBRE 3 SENSOR) MISC Change sensor every 14 days.   [DISCONTINUED] insulin lispro (HUMALOG KWIKPEN) 100 UNIT/ML KwikPen Inject 4-7 Units into the skin 3 (three) times daily with meals.   [DISCONTINUED] TRESIBA FLEXTOUCH 100 UNIT/ML FlexTouch Pen Inject 20 Units into the skin at bedtime.   Continuous Glucose Sensor (FREESTYLE LIBRE 3 SENSOR) MISC Change sensor every 14 days.   insulin lispro (HUMALOG KWIKPEN) 100 UNIT/ML KwikPen Inject 6-12 Units into the skin 3 (three) times daily.   metoprolol succinate (TOPROL-XL) 50 MG 24 hr tablet Take 1 tablet (50 mg total) by mouth every evening. Take with or immediately following a meal.   TRESIBA FLEXTOUCH 100 UNIT/ML FlexTouch Pen Inject 20 Units into the skin at bedtime.   [DISCONTINUED] fexofenadine (ALLEGRA) 60 MG tablet Take by mouth.   No facility-administered encounter medications on file as of 04/09/2023.    ALLERGIES: Allergies  Allergen Reactions   Amlodipine    Ciprocin-Fluocin-Procin [Fluocinolone]     Nightmares   Lipitor [Atorvastatin] Other (See Comments)    Muscle aches, pain   Nebivolol    Nitrofuran Derivatives    Lisinopril Cough   Red Yeast Rice [Cholestin] Itching and Rash    VACCINATION STATUS: Immunization History  Administered Date(s) Administered   Pneumococcal Polysaccharide-23 04/04/2012    Diabetes She presents for her follow-up diabetic visit. She has type 2 diabetes mellitus. Onset time: Was diagnosed at approx age of 35. Her disease course has been fluctuating. There are no hypoglycemic associated symptoms. Associated symptoms include fatigue and foot paresthesias. Pertinent negatives for diabetes include no polydipsia, no polyuria and no weight loss. There are no hypoglycemic  complications. Symptoms are improving. Diabetic complications include heart disease and peripheral neuropathy. (Had a MI in the past, recently hospitalized for DKA) Risk factors for coronary artery disease include diabetes mellitus, dyslipidemia, hypertension, post-menopausal and sedentary lifestyle. Current diabetic treatment includes oral agent (monotherapy) and intensive insulin program. She is compliant with treatment most of the time. Her weight is fluctuating minimally. She  is following a generally healthy diet. When asked about meal planning, she reported none. She has not had a previous visit with a dietitian (Has appt with Boyd Kerbs after me today). She participates in exercise intermittently. Her home blood glucose trend is fluctuating dramatically. Her overall blood glucose range is >200 mg/dl. (She presents today with her CGM and logs showing tight fasting and above target postprandial glycemic profile.  Her POCT A1c today is 9.4%, increasing from last visit of 8.5%.  Analysis of her CGM shows TIR 33%, TAR 65%, TBR <2% with a GMI of 8.5%.  She notes she has been eating 3 meals per day, does not skip insulin injections, does not sleep late.) An ACE inhibitor/angiotensin II receptor blocker is being taken. She does not see a podiatrist.Eye exam is current.  Hypertension This is a chronic problem. The current episode started more than 1 year ago. The problem has been resolved since onset. The problem is controlled. Agents associated with hypertension include thyroid hormones. Risk factors for coronary artery disease include diabetes mellitus, dyslipidemia, post-menopausal state and sedentary lifestyle. Past treatments include angiotensin blockers, calcium channel blockers and beta blockers. The current treatment provides significant improvement. There are no compliance problems.  Hypertensive end-organ damage includes CAD/MI. Identifiable causes of hypertension include a thyroid problem.     Review of  systems  Constitutional: + decreasing body weight,  current Body mass index is 20.05 kg/m. , + fatigue- deconditioned, no subjective hyperthermia, no subjective hypothermia Eyes: no blurry vision, no xerophthalmia ENT: no sore throat, no nodules palpated in throat, no dysphagia/odynophagia, no hoarseness Cardiovascular: no chest pain, no shortness of breath, no palpitations, no leg swelling Respiratory: no cough, no shortness of breath Gastrointestinal: no nausea/vomiting/diarrhea Musculoskeletal: no muscle/joint aches Skin: no rashes, no hyperemia Neurological: no tremors, + numbness/tingling to BLE, no dizziness Psychiatric: no depression, no anxiety  Objective:     BP 110/70 (BP Location: Left Arm, Patient Position: Sitting, Cuff Size: Normal)   Pulse 67   Ht 5\' 7"  (1.702 m)   Wt 128 lb (58.1 kg)   BMI 20.05 kg/m   Wt Readings from Last 3 Encounters:  04/09/23 128 lb (58.1 kg)  01/02/23 126 lb 9.6 oz (57.4 kg)  07/05/22 135 lb (61.2 kg)     BP Readings from Last 3 Encounters:  04/09/23 110/70  01/02/23 114/74  07/05/22 (!) 157/93      Physical Exam- Limited  Constitutional:  Body mass index is 20.05 kg/m. , not in acute distress, normal state of mind Eyes:  EOMI, no exophthalmos Neck: Supple Musculoskeletal: no gross deformities, strength intact in all four extremities, no gross restriction of joint movements, walks with cane Skin:  no rashes, no hyperemia Neurological: no tremor with outstretched hands   Diabetic Foot Exam - Simple   Simple Foot Form Diabetic Foot exam was performed with the following findings: Yes 04/09/2023 11:09 AM  Visual Inspection No deformities, no ulcerations, no other skin breakdown bilaterally: Yes Sensation Testing Intact to touch and monofilament testing bilaterally: Yes Pulse Check Posterior Tibialis and Dorsalis pulse intact bilaterally: Yes Comments Mild onychomycosis to left great toe      CMP ( most recent) CMP      Component Value Date/Time   NA 132 (L) 07/05/2022 1100   NA 139 04/25/2021 0000   K 4.3 07/05/2022 1100   CL 101 07/05/2022 1100   CO2 27 07/05/2022 1100   GLUCOSE 311 (H) 07/05/2022 1100   BUN 17 07/05/2022 1100  BUN 16 01/30/2022 0000   CREATININE 0.58 07/05/2022 1100   CREATININE 0.78 09/20/2016 0923   CALCIUM 9.5 07/05/2022 1100   PROT 7.1 07/05/2022 1100   PROT 6.7 07/17/2017 0912   ALBUMIN 4.0 07/05/2022 1100   ALBUMIN 4.4 07/17/2017 0912   AST 17 07/05/2022 1100   ALT 15 07/05/2022 1100   ALKPHOS 56 07/05/2022 1100   BILITOT 0.7 07/05/2022 1100   BILITOT 0.5 07/17/2017 0912   GFRNONAA >60 07/05/2022 1100   GFRNONAA 77 09/20/2016 0923   GFRAA >60 08/21/2019 1539   GFRAA 88 09/20/2016 0923     Diabetic Labs (most recent): Lab Results  Component Value Date   HGBA1C 9.4 (A) 04/09/2023   HGBA1C 8.5 06/19/2022   HGBA1C 9.3 01/30/2022   MICROALBUR 150 02/21/2021      Lab Results  Component Value Date   TSH 1.68 01/30/2022   TSH 1.62 07/24/2021   TSH 11.00 (A) 04/25/2021   TSH 19.240 (H) 07/17/2017   TSH 1.91 09/20/2016   TSH 3.05 06/14/2016   TSH 2.86 03/12/2016   TSH 4.280 04/04/2012   FREET4 1.53 07/17/2017   FREET4 1.5 09/20/2016   FREET4 1.6 06/14/2016   FREET4 1.5 03/12/2016     Assessment & Plan:   1) Uncontrolled type 2 diabetes mellitus with circulatory disorder, with long-term current use of insulin (HCC)  - Gabrielle Wood has currently uncontrolled symptomatic type 2 DM since 77 years of age.  She presents today with her CGM and logs showing tight fasting and above target postprandial glycemic profile.  Her POCT A1c today is 9.4%, increasing from last visit of 8.5%.  Analysis of her CGM shows TIR 33%, TAR 65%, TBR <2% with a GMI of 8.5%.  She notes she has been eating 3 meals per day, does not skip insulin injections, does not sleep late.  -Recent labs reviewed.  - I had a long discussion with her about the progressive nature of diabetes  and the pathology behind its complications. 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 These are all discussed in detail with her.  - Nutritional counseling repeated at each appointment due to patients tendency to fall back in to old habits.  - The patient admits there is a room for improvement in their diet and drink choices. -  Suggestion is made for the patient to avoid simple carbohydrates from their diet including Cakes, Sweet Desserts / Pastries, Ice Cream, Soda (diet and regular), Sweet Tea, Candies, Chips, Cookies, Sweet Pastries, Store Bought Juices, Alcohol in Excess of 1-2 drinks a day, Artificial Sweeteners, Coffee Creamer, and "Sugar-free" Products. This will help patient to have stable blood glucose profile and potentially avoid unintended weight gain.   - I encouraged the patient to switch to unprocessed or minimally processed complex starch and increased protein intake (animal or plant source), fruits, and vegetables.   - Patient is advised to stick to a routine mealtimes to eat 3 meals a day and avoid unnecessary snacks (to snack only to correct hypoglycemia).  - I have approached her with the following individualized plan to manage  her diabetes and patient agrees:   -She is advised to lower her dose of Tresiba to 20 units SQ nightly (has been injecting 24 units) and adjust her Humalog to 6-12 units (being more aggressive with the sliding scale to help prevent  postprandial hyperglycemia) if glucose is above 90 and she is eating (Specific instructions on how to titrate insulin dosage based on glucose readings given to patient in writing). She can continue Metformin 500 mg po twice daily with meals (better tolerated than the full dose).   -she is encouraged to continue monitoring blood glucose 4 times daily (using her CGM), before meals  and before bed, and to call the clinic if she has readings less than 70 or greater than 300 for 3 tests in a row.  - she is warned not to take insulin without proper monitoring per orders. - Adjustment parameters are given to her for hypo and hyperglycemia in writing.  -She is not a candidate for incretin therapy due to body habitus.  - Specific targets for  A1c;  LDL, HDL,  and Triglycerides were discussed with the patient.  2) Blood Pressure /Hypertension: Her BP is controlled to target.   she is advised to continue her current medications including Valsartan 80 mg p.o. daily with breakfast.  3)  Weight/Diet:  her Body mass index is 20.05 kg/m.  -  she is not a candidate for weight loss.  Exercise, and detailed carbohydrates information provided  -  detailed on discharge instructions.  4) Hypothyroidism- unspecified There are no recent TFTs to review. She is advised to continue her current dose of Levothyroxine at 125 mcg po daily before breakfast (above her weight based max dose of 94 mcg). Likely has absorption issue. Will recheck TFTs prior to next visit and adjust dose accordingly.   - The correct intake of thyroid hormone (Levothyroxine, Synthroid), is on empty stomach first thing in the morning, with water, separated by at least 30 minutes from breakfast and other medications,  and separated by more than 4 hours from calcium, iron, multivitamins, acid reflux medications (PPIs).  - This medication is a life-long medication and will be needed to correct thyroid hormone imbalances for the rest of your life.  The dose may change from time to time, based on thyroid blood work.  - It is extremely important to be consistent taking this medication, near the same time each morning.  -AVOID TAKING PRODUCTS CONTAINING BIOTIN (commonly found in Hair, Skin, Nails vitamins) AS IT INTERFERES WITH THE VALIDITY OF THYROID FUNCTION BLOOD TESTS.   5) Chronic Care/Health Maintenance: -she is on  ACEI/ARB and is encouraged to initiate and continue to follow up with Ophthalmology, Dentist, Podiatrist at least yearly or according to recommendations, and advised to stay away from smoking. I have recommended yearly flu vaccine and pneumonia vaccine at least every 5 years; moderate intensity exercise for up to 150 minutes weekly; and sleep for at least 7 hours a day.  - she is advised to maintain close follow up with [name], MD for primary care needs, as well as her other providers for optimal and coordinated care.     I spent  46  minutes in the care of the patient today including review of labs from CMP, Lipids, Thyroid Function, Hematology (current and previous including abstractions from other facilities); face-to-face time discussing  her blood glucose readings/logs, discussing hypoglycemia and hyperglycemia episodes and symptoms, medications doses, her options of short and long term treatment based on the latest standards of care / guidelines;  discussion about incorporating lifestyle medicine;  and documenting the encounter. Risk reduction counseling performed per USPSTF guidelines to reduce obesity and cardiovascular risk factors.     Please refer to Patient Instructions for Blood Glucose  Monitoring and Insulin/Medications Dosing Guide"  in media tab for additional information. Please  also refer to " Patient Self Inventory" in the Media  tab for reviewed elements of pertinent patient history.  Gabrielle Wood participated in the discussions, expressed understanding, and voiced agreement with the above plans.  All questions were answered to her satisfaction. she is encouraged to contact clinic should she have any questions or concerns prior to her return visit.   Follow up plan: - Return in about 3 months (around 07/10/2023) for Diabetes F/U with A1c in office, Thyroid follow up, Previsit labs, Bring meter and logs.  Ronny Bacon, [facility] [address] [place], [place] [address] Phone: [phone] Fax: [phone]  04/09/2023, 11:14 AM

## 2023-05-07 DIAGNOSIS — E1165 Type 2 diabetes mellitus with hyperglycemia: Secondary | ICD-10-CM | POA: Diagnosis not present

## 2023-05-07 DIAGNOSIS — E1110 Type 2 diabetes mellitus with ketoacidosis without coma: Secondary | ICD-10-CM | POA: Diagnosis not present

## 2023-05-27 DIAGNOSIS — Z7984 Long term (current) use of oral hypoglycemic drugs: Secondary | ICD-10-CM | POA: Diagnosis not present

## 2023-05-27 DIAGNOSIS — Z87891 Personal history of nicotine dependence: Secondary | ICD-10-CM | POA: Diagnosis not present

## 2023-05-27 DIAGNOSIS — G319 Degenerative disease of nervous system, unspecified: Secondary | ICD-10-CM | POA: Diagnosis not present

## 2023-05-27 DIAGNOSIS — I6523 Occlusion and stenosis of bilateral carotid arteries: Secondary | ICD-10-CM | POA: Diagnosis not present

## 2023-05-27 DIAGNOSIS — I10 Essential (primary) hypertension: Secondary | ICD-10-CM | POA: Diagnosis not present

## 2023-05-27 DIAGNOSIS — I6782 Cerebral ischemia: Secondary | ICD-10-CM | POA: Diagnosis not present

## 2023-05-27 DIAGNOSIS — Z7982 Long term (current) use of aspirin: Secondary | ICD-10-CM | POA: Diagnosis not present

## 2023-05-27 DIAGNOSIS — R739 Hyperglycemia, unspecified: Secondary | ICD-10-CM | POA: Diagnosis not present

## 2023-05-27 DIAGNOSIS — G9389 Other specified disorders of brain: Secondary | ICD-10-CM | POA: Diagnosis not present

## 2023-05-27 DIAGNOSIS — I6789 Other cerebrovascular disease: Secondary | ICD-10-CM | POA: Diagnosis not present

## 2023-05-27 DIAGNOSIS — R4182 Altered mental status, unspecified: Secondary | ICD-10-CM | POA: Diagnosis not present

## 2023-05-27 DIAGNOSIS — Z794 Long term (current) use of insulin: Secondary | ICD-10-CM | POA: Diagnosis not present

## 2023-05-27 DIAGNOSIS — I2510 Atherosclerotic heart disease of native coronary artery without angina pectoris: Secondary | ICD-10-CM | POA: Diagnosis not present

## 2023-05-27 DIAGNOSIS — E7800 Pure hypercholesterolemia, unspecified: Secondary | ICD-10-CM | POA: Diagnosis not present

## 2023-05-27 DIAGNOSIS — E079 Disorder of thyroid, unspecified: Secondary | ICD-10-CM | POA: Diagnosis not present

## 2023-05-27 DIAGNOSIS — J449 Chronic obstructive pulmonary disease, unspecified: Secondary | ICD-10-CM | POA: Diagnosis not present

## 2023-05-27 DIAGNOSIS — E1165 Type 2 diabetes mellitus with hyperglycemia: Secondary | ICD-10-CM | POA: Diagnosis not present

## 2023-05-27 DIAGNOSIS — R410 Disorientation, unspecified: Secondary | ICD-10-CM | POA: Diagnosis not present

## 2023-05-27 DIAGNOSIS — Z79890 Hormone replacement therapy (postmenopausal): Secondary | ICD-10-CM | POA: Diagnosis not present

## 2023-05-28 DIAGNOSIS — F419 Anxiety disorder, unspecified: Secondary | ICD-10-CM

## 2023-05-28 DIAGNOSIS — I6782 Cerebral ischemia: Secondary | ICD-10-CM | POA: Diagnosis not present

## 2023-05-28 DIAGNOSIS — G319 Degenerative disease of nervous system, unspecified: Secondary | ICD-10-CM | POA: Diagnosis not present

## 2023-05-28 HISTORY — DX: Anxiety disorder, unspecified: F41.9

## 2023-06-05 DIAGNOSIS — Z6820 Body mass index (BMI) 20.0-20.9, adult: Secondary | ICD-10-CM | POA: Diagnosis not present

## 2023-06-05 DIAGNOSIS — F17210 Nicotine dependence, cigarettes, uncomplicated: Secondary | ICD-10-CM | POA: Diagnosis not present

## 2023-06-05 DIAGNOSIS — R030 Elevated blood-pressure reading, without diagnosis of hypertension: Secondary | ICD-10-CM | POA: Diagnosis not present

## 2023-06-05 DIAGNOSIS — E1165 Type 2 diabetes mellitus with hyperglycemia: Secondary | ICD-10-CM | POA: Diagnosis not present

## 2023-06-05 DIAGNOSIS — R410 Disorientation, unspecified: Secondary | ICD-10-CM | POA: Diagnosis not present

## 2023-06-06 DIAGNOSIS — E1110 Type 2 diabetes mellitus with ketoacidosis without coma: Secondary | ICD-10-CM | POA: Diagnosis not present

## 2023-06-06 DIAGNOSIS — E1165 Type 2 diabetes mellitus with hyperglycemia: Secondary | ICD-10-CM | POA: Diagnosis not present

## 2023-06-13 DIAGNOSIS — E7801 Familial hypercholesterolemia: Secondary | ICD-10-CM | POA: Diagnosis not present

## 2023-06-13 DIAGNOSIS — E1169 Type 2 diabetes mellitus with other specified complication: Secondary | ICD-10-CM | POA: Diagnosis not present

## 2023-06-13 DIAGNOSIS — E1165 Type 2 diabetes mellitus with hyperglycemia: Secondary | ICD-10-CM | POA: Diagnosis not present

## 2023-06-13 DIAGNOSIS — Z862 Personal history of diseases of the blood and blood-forming organs and certain disorders involving the immune mechanism: Secondary | ICD-10-CM | POA: Diagnosis not present

## 2023-06-24 DIAGNOSIS — Z681 Body mass index (BMI) 19 or less, adult: Secondary | ICD-10-CM | POA: Diagnosis not present

## 2023-06-24 DIAGNOSIS — M5416 Radiculopathy, lumbar region: Secondary | ICD-10-CM | POA: Diagnosis not present

## 2023-06-24 DIAGNOSIS — E7801 Familial hypercholesterolemia: Secondary | ICD-10-CM | POA: Diagnosis not present

## 2023-06-24 DIAGNOSIS — E039 Hypothyroidism, unspecified: Secondary | ICD-10-CM | POA: Diagnosis not present

## 2023-06-24 DIAGNOSIS — F32A Depression, unspecified: Secondary | ICD-10-CM | POA: Diagnosis not present

## 2023-06-24 DIAGNOSIS — R599 Enlarged lymph nodes, unspecified: Secondary | ICD-10-CM | POA: Diagnosis not present

## 2023-06-24 DIAGNOSIS — Z862 Personal history of diseases of the blood and blood-forming organs and certain disorders involving the immune mechanism: Secondary | ICD-10-CM | POA: Diagnosis not present

## 2023-06-24 DIAGNOSIS — F17210 Nicotine dependence, cigarettes, uncomplicated: Secondary | ICD-10-CM | POA: Diagnosis not present

## 2023-06-24 DIAGNOSIS — E1169 Type 2 diabetes mellitus with other specified complication: Secondary | ICD-10-CM | POA: Diagnosis not present

## 2023-06-24 DIAGNOSIS — Z6820 Body mass index (BMI) 20.0-20.9, adult: Secondary | ICD-10-CM | POA: Diagnosis not present

## 2023-06-24 DIAGNOSIS — E1165 Type 2 diabetes mellitus with hyperglycemia: Secondary | ICD-10-CM | POA: Diagnosis not present

## 2023-06-24 DIAGNOSIS — I10 Essential (primary) hypertension: Secondary | ICD-10-CM | POA: Diagnosis not present

## 2023-07-05 DIAGNOSIS — I2510 Atherosclerotic heart disease of native coronary artery without angina pectoris: Secondary | ICD-10-CM | POA: Diagnosis not present

## 2023-07-05 DIAGNOSIS — J984 Other disorders of lung: Secondary | ICD-10-CM | POA: Diagnosis not present

## 2023-07-05 DIAGNOSIS — R911 Solitary pulmonary nodule: Secondary | ICD-10-CM | POA: Diagnosis not present

## 2023-07-05 DIAGNOSIS — I2584 Coronary atherosclerosis due to calcified coronary lesion: Secondary | ICD-10-CM | POA: Diagnosis not present

## 2023-07-05 DIAGNOSIS — I700 Atherosclerosis of aorta: Secondary | ICD-10-CM | POA: Diagnosis not present

## 2023-07-05 DIAGNOSIS — Z8709 Personal history of other diseases of the respiratory system: Secondary | ICD-10-CM | POA: Diagnosis not present

## 2023-07-09 DIAGNOSIS — E1169 Type 2 diabetes mellitus with other specified complication: Secondary | ICD-10-CM | POA: Diagnosis not present

## 2023-07-09 DIAGNOSIS — E039 Hypothyroidism, unspecified: Secondary | ICD-10-CM | POA: Diagnosis not present

## 2023-07-09 DIAGNOSIS — I10 Essential (primary) hypertension: Secondary | ICD-10-CM | POA: Diagnosis not present

## 2023-07-11 ENCOUNTER — Ambulatory Visit: Payer: HMO | Admitting: Nurse Practitioner

## 2023-07-11 DIAGNOSIS — Z7984 Long term (current) use of oral hypoglycemic drugs: Secondary | ICD-10-CM

## 2023-07-11 DIAGNOSIS — Z794 Long term (current) use of insulin: Secondary | ICD-10-CM

## 2023-07-11 DIAGNOSIS — E11649 Type 2 diabetes mellitus with hypoglycemia without coma: Secondary | ICD-10-CM

## 2023-07-11 DIAGNOSIS — E039 Hypothyroidism, unspecified: Secondary | ICD-10-CM

## 2023-07-11 DIAGNOSIS — E559 Vitamin D deficiency, unspecified: Secondary | ICD-10-CM

## 2023-07-15 DIAGNOSIS — N858 Other specified noninflammatory disorders of uterus: Secondary | ICD-10-CM | POA: Diagnosis not present

## 2023-07-15 DIAGNOSIS — R935 Abnormal findings on diagnostic imaging of other abdominal regions, including retroperitoneum: Secondary | ICD-10-CM | POA: Diagnosis not present

## 2023-07-15 DIAGNOSIS — F17210 Nicotine dependence, cigarettes, uncomplicated: Secondary | ICD-10-CM | POA: Diagnosis not present

## 2023-07-15 DIAGNOSIS — R030 Elevated blood-pressure reading, without diagnosis of hypertension: Secondary | ICD-10-CM | POA: Diagnosis not present

## 2023-07-15 DIAGNOSIS — N390 Urinary tract infection, site not specified: Secondary | ICD-10-CM | POA: Diagnosis not present

## 2023-07-15 DIAGNOSIS — I700 Atherosclerosis of aorta: Secondary | ICD-10-CM | POA: Diagnosis not present

## 2023-07-15 DIAGNOSIS — R109 Unspecified abdominal pain: Secondary | ICD-10-CM | POA: Diagnosis not present

## 2023-07-15 DIAGNOSIS — N281 Cyst of kidney, acquired: Secondary | ICD-10-CM | POA: Diagnosis not present

## 2023-07-15 DIAGNOSIS — R9341 Abnormal radiologic findings on diagnostic imaging of renal pelvis, ureter, or bladder: Secondary | ICD-10-CM | POA: Diagnosis not present

## 2023-07-16 DIAGNOSIS — R030 Elevated blood-pressure reading, without diagnosis of hypertension: Secondary | ICD-10-CM | POA: Diagnosis not present

## 2023-07-16 DIAGNOSIS — Z681 Body mass index (BMI) 19 or less, adult: Secondary | ICD-10-CM | POA: Diagnosis not present

## 2023-07-16 DIAGNOSIS — N390 Urinary tract infection, site not specified: Secondary | ICD-10-CM | POA: Diagnosis not present

## 2023-07-16 DIAGNOSIS — F17210 Nicotine dependence, cigarettes, uncomplicated: Secondary | ICD-10-CM | POA: Diagnosis not present

## 2023-07-23 ENCOUNTER — Encounter: Payer: Self-pay | Admitting: Nurse Practitioner

## 2023-07-23 ENCOUNTER — Ambulatory Visit: Payer: PPO | Admitting: Nurse Practitioner

## 2023-07-23 VITALS — BP 114/72 | HR 79 | Ht 67.0 in | Wt 124.6 lb

## 2023-07-23 DIAGNOSIS — E11649 Type 2 diabetes mellitus with hypoglycemia without coma: Secondary | ICD-10-CM

## 2023-07-23 DIAGNOSIS — Z794 Long term (current) use of insulin: Secondary | ICD-10-CM

## 2023-07-23 DIAGNOSIS — E039 Hypothyroidism, unspecified: Secondary | ICD-10-CM

## 2023-07-23 DIAGNOSIS — Z7984 Long term (current) use of oral hypoglycemic drugs: Secondary | ICD-10-CM

## 2023-07-23 LAB — POCT GLYCOSYLATED HEMOGLOBIN (HGB A1C): Hemoglobin A1C: 9.8 % — AB (ref 4.0–5.6)

## 2023-07-23 MED ORDER — METFORMIN HCL 500 MG PO TABS: 500.0000 mg | ORAL_TABLET | Freq: Two times a day (BID) | ORAL | 3 refills | Status: DC

## 2023-07-23 MED ORDER — INSULIN LISPRO (1 UNIT DIAL) 100 UNIT/ML (KWIKPEN): 8.0000 [IU] | PEN_INJECTOR | Freq: Three times a day (TID) | SUBCUTANEOUS | 3 refills | Status: DC

## 2023-07-23 NOTE — Progress Notes (Signed)
Endocrinology Follow Up Note       07/23/2023, 4:04 PM   Subjective:    Patient ID: Gabrielle Wood, female    DOB: 01/16/1946.  Gabrielle Wood is being seen in follow up after being seen in consultation for management of currently uncontrolled symptomatic diabetes requested by  [name], MD.   Past Medical History:  Diagnosis Date   Arthritis    COPD (chronic obstructive pulmonary disease) (HCC)    Diabetes mellitus    DVT (deep venous thrombosis) (HCC)    years ago   GERD (gastroesophageal reflux disease)    H. pylori infection 2013   S/p treatment with amoxicillin, Biaxin, PPI   High cholesterol    Hypertension    Hypothyroidism    NSTEMI (non-ST elevated myocardial infarction) (HCC) 12/2020   s/p  PCI DES to the LCx   Thyroid disease     Past Surgical History:  Procedure Laterality Date   BACK SURGERY     lumbar x3    ESOPHAGOGASTRODUODENOSCOPY  04/04/2012   Moderate gastritis/MODERATE Duodenitis in the duodenal bulb duodenum.  Pathology with chronic H. pylori gastritis.   EUS  05/14/2012   Normal pancreas, gallbladder,biliary tree/ Incidental finding of simple-appearing right renal cyst.   TONSILLECTOMY     TUBAL LIGATION     VEIN SURGERY     stripping years ago but recent laser surgery; bilateral legs    Social History   Socioeconomic History   Marital status: Widowed    Spouse name: Not on file   Number of children: 2   Years of education: Not on file   Highest education level: Not on file  Occupational History   Occupation: retired, Advertising copywriter at [employer]  Tobacco Use   Smoking status: Former    Current packs/day: 0.50    Types: Cigarettes   Smokeless tobacco: Never  Vaping Use   Vaping status: Never Used  Substance and Sexual Activity   Alcohol use: No   Drug use: No   Sexual activity: Not on file  Other Topics Concern   Not on file  Social History Narrative    Not on file   Social Determinants of Health   Financial Resource Strain: Low Risk  (11/05/2022)   Received from [facility], [facility]   Overall Financial Resource Strain (CARDIA)    Difficulty of Paying Living Expenses: Not hard at all  Food Insecurity: No Food Insecurity (11/05/2022)   Received from [facility], [facility]   Hunger Vital Sign    Worried About Running Out of Food in the Last Year: Never true    Ran Out of Food in the Last Year: Never true  Transportation Needs: No Transportation Needs (11/05/2022)   Received from [facility], [facility]   [facility] - Transportation    Lack of Transportation (Medical): No    Lack of Transportation (Non-Medical): No  Physical Activity: Not on file  Stress: Not on file  Social Connections: Unknown (04/15/2022)   Received [facility], [facility]   Social Network    Social Network: Not on file    Family History  Problem Relation  Age of Onset   Pancreatic cancer Mother        diagnosed at age 62s, died from heart attack   Inflammatory bowel disease Brother        crohn's, his dgt deceased from colon cancer at age 1   Colon cancer Neg Hx     Outpatient Encounter Medications as of 07/23/2023  Medication Sig   ALPRAZolam (XANAX) 1 MG tablet Take 0.5-1 mg by mouth. 0.5 tablet in am and 0.5 tab at night   amLODipine (NORVASC) 5 MG tablet Take 5 mg by mouth daily.   aspirin EC 81 MG tablet Take 81 mg by mouth daily. Swallow whole.   atorvastatin (LIPITOR) 80 MG tablet Take 80 mg by mouth every evening.   Continuous Glucose Sensor (FREESTYLE LIBRE 3 SENSOR) MISC Change sensor every 14 days.   dicyclomine (BENTYL) 20 MG tablet Take 1 tablet (20 mg total) by mouth 2 (two) times daily.   gabapentin (NEURONTIN) 300 MG capsule Take 300 mg by mouth 3 (three) times daily.   levocetirizine (XYZAL) 5 MG tablet Take 1 tablet (5 mg total) by mouth every evening.   levothyroxine (SYNTHROID) 125 MCG tablet TAKE ONE  TABLET BY MOUTH DAILY (ONE HOUR BEFORE FOOD OR DRINK)   loperamide (IMODIUM) 2 MG capsule Take 2 mg by mouth 4 (four) times daily as needed.   metoprolol succinate (TOPROL-XL) 50 MG 24 hr tablet Take 1 tablet (50 mg total) by mouth every evening. Take with or immediately following a meal.   nitroGLYCERIN (NITROSTAT) 0.4 MG SL tablet Place 0.4 mg under the tongue every 5 (five) minutes as needed for chest pain.   ondansetron (ZOFRAN-ODT) 4 MG disintegrating tablet Take 4 mg by mouth every 8 (eight) hours as needed.   pantoprazole (PROTONIX) 40 MG tablet Take 40 mg by mouth daily.   ticagrelor (BRILINTA) 90 MG TABS tablet Take 90 mg by mouth 2 (two) times daily.   TRESIBA FLEXTOUCH 100 UNIT/ML FlexTouch Pen Inject 20 Units into the skin at bedtime.   valsartan (DIOVAN) 80 MG tablet Take 40 mg by mouth daily.   [DISCONTINUED] insulin lispro (HUMALOG KWIKPEN) 100 UNIT/ML KwikPen Inject 6-12 Units into the skin 3 (three) times daily.   [DISCONTINUED] metFORMIN (GLUCOPHAGE) 500 MG tablet Take 1 tablet (500 mg total) by mouth 2 (two) times daily with a meal. 2 tabs bid (Patient taking differently: Take 500 mg by mouth 2 (two) times daily with a meal.)   insulin lispro (HUMALOG KWIKPEN) 100 UNIT/ML KwikPen Inject 8-14 Units into the skin 3 (three) times daily.   metFORMIN (GLUCOPHAGE) 500 MG tablet Take 1 tablet (500 mg total) by mouth 2 (two) times daily with a meal.   vitamin C (ASCORBIC ACID) 500 MG tablet Take 500 mg by mouth daily. (Patient not taking: Reported on 07/23/2023)   [DISCONTINUED] fexofenadine (ALLEGRA) 60 MG tablet Take by mouth.   No facility-administered encounter medications on file as of 07/23/2023.    ALLERGIES: Allergies  Allergen Reactions   Amlodipine    Ciprocin-Fluocin-Procin [Fluocinolone]     Nightmares   Lipitor [Atorvastatin] Other (See Comments)    Muscle aches, pain   Nebivolol    Nitrofuran Derivatives    Lisinopril Cough   Red Yeast Rice [Cholestin] Itching and  Rash    VACCINATION STATUS: Immunization History  Administered Date(s) Administered   Pneumococcal Polysaccharide-23 04/04/2012    Diabetes She presents for her follow-up diabetic visit. She has type 2 diabetes mellitus. Onset time: Was diagnosed  at approx age of 28. Her disease course has been fluctuating. There are no hypoglycemic associated symptoms. Associated symptoms include fatigue and foot paresthesias. Pertinent negatives for diabetes include no polydipsia, no polyuria and no weight loss. There are no hypoglycemic complications. Symptoms are improving. Diabetic complications include heart disease and peripheral neuropathy. (Had a MI in the past, recently hospitalized for DKA) Risk factors for coronary artery disease include diabetes mellitus, dyslipidemia, hypertension, post-menopausal and sedentary lifestyle. Current diabetic treatment includes oral agent (monotherapy) and intensive insulin program. She is compliant with treatment most of the time. Her weight is fluctuating minimally. She is following a generally healthy diet. When asked about meal planning, she reported none. She has not had a previous visit with a dietitian (Has appt with Boyd Kerbs after me today). She participates in exercise intermittently. Her home blood glucose trend is fluctuating dramatically. Her overall blood glucose range is >200 mg/dl. (She presents today with her CGM and logs showing at goal fasting and above target postprandial glycemic profile.  Her POCT A1c today is 9.8%, increasing from last visit of 9.4%.  Analysis of her CGM shows TIR 37%, TAR 63%, TBR 0% with a GMI of 8.4%.  She notes she has been eating 3 meals per day, does not skip insulin injections, does not sleep late.  She still has problems with recurrent UTIs- currently on antibiotics.  Her PCP suggested she go to see urologist but has not made the referral just yet.) An ACE inhibitor/angiotensin II receptor blocker is being taken. She does not see a  podiatrist.Eye exam is current.  Hypertension This is a chronic problem. The current episode started more than 1 year ago. The problem has been resolved since onset. The problem is controlled. Agents associated with hypertension include thyroid hormones. Risk factors for coronary artery disease include diabetes mellitus, dyslipidemia, post-menopausal state and sedentary lifestyle. Past treatments include angiotensin blockers, calcium channel blockers and beta blockers. The current treatment provides significant improvement. There are no compliance problems.  Hypertensive end-organ damage includes CAD/MI. Identifiable causes of hypertension include a thyroid problem.     Review of systems  Constitutional: + decreasing body weight,  current Body mass index is 19.52 kg/m. , + fatigue- deconditioned, no subjective hyperthermia, no subjective hypothermia Eyes: no blurry vision, no xerophthalmia ENT: no sore throat, no nodules palpated in throat, no dysphagia/odynophagia, no hoarseness Cardiovascular: no chest pain, no shortness of breath, no palpitations, no leg swelling Respiratory: no cough, no shortness of breath Gastrointestinal: no nausea/vomiting, + intermittent diarrhea with incontinence Genitourinary: +polyuria with incontinence and recurrent UTIs Musculoskeletal: no muscle/joint aches Skin: no rashes, no hyperemia Neurological: no tremors, + numbness/tingling to BLE, no dizziness Psychiatric: no depression, no anxiety  Objective:     BP 114/72 (BP Location: Right Arm, Patient Position: Sitting, Cuff Size: Large)   Pulse 79   Ht 5\' 7"  (1.702 m)   Wt 124 lb 9.6 oz (56.5 kg)   BMI 19.52 kg/m   Wt Readings from Last 3 Encounters:  07/23/23 124 lb 9.6 oz (56.5 kg)  04/09/23 128 lb (58.1 kg)  01/02/23 126 lb 9.6 oz (57.4 kg)     BP Readings from Last 3 Encounters:  07/23/23 114/72  04/09/23 110/70  01/02/23 114/74      Physical Exam- Limited  Constitutional:  Body mass  index is 19.52 kg/m. , not in acute distress, normal state of mind Eyes:  EOMI, no exophthalmos Neck: Supple Musculoskeletal: no gross deformities, strength intact in all four extremities,  no gross restriction of joint movements, walks with cane Skin:  no rashes, no hyperemia Neurological: no tremor with outstretched hands   Diabetic Foot Exam - Simple   No data filed      CMP ( most recent) CMP     Component Value Date/Time   NA 132 (L) 07/05/2022 1100   NA 139 04/25/2021 0000   K 4.3 07/05/2022 1100   CL 101 07/05/2022 1100   CO2 27 07/05/2022 1100   GLUCOSE 311 (H) 07/05/2022 1100   BUN 17 07/05/2022 1100   BUN 16 01/30/2022 0000   CREATININE 0.58 07/05/2022 1100   CREATININE 0.78 09/20/2016 0923   CALCIUM 9.5 07/05/2022 1100   PROT 7.1 07/05/2022 1100   PROT 6.7 07/17/2017 0912   ALBUMIN 4.0 07/05/2022 1100   ALBUMIN 4.4 07/17/2017 0912   AST 17 07/05/2022 1100   ALT 15 07/05/2022 1100   ALKPHOS 56 07/05/2022 1100   BILITOT 0.7 07/05/2022 1100   BILITOT 0.5 07/17/2017 0912   GFRNONAA 83 07/10/2023 1107   GFRNONAA 77 09/20/2016 0923   GFRAA >60 08/21/2019 1539   GFRAA 88 09/20/2016 0923     Diabetic Labs (most recent): Lab Results  Component Value Date   HGBA1C 9.8 (A) 07/23/2023   HGBA1C 9.4 (A) 04/09/2023   HGBA1C 8.5 06/19/2022   MICROALBUR 150 02/21/2021      Lab Results  Component Value Date   TSH 1.68 01/30/2022   TSH 1.62 07/24/2021   TSH 11.00 (A) 04/25/2021   TSH 19.240 (H) 07/17/2017   TSH 1.91 09/20/2016   TSH 3.05 06/14/2016   TSH 2.86 03/12/2016   TSH 4.280 04/04/2012   FREET4 1.53 07/17/2017   FREET4 1.5 09/20/2016   FREET4 1.6 06/14/2016   FREET4 1.5 03/12/2016     Assessment & Plan:   1) Uncontrolled type 2 diabetes mellitus with circulatory disorder, with long-term current use of insulin (HCC)  - Day J Cortopassi has currently uncontrolled symptomatic type 2 DM since 77 years of age.  She presents today with her CGM and  logs showing at goal fasting and above target postprandial glycemic profile.  Her POCT A1c today is 9.8%, increasing from last visit of 9.4%.  Analysis of her CGM shows TIR 37%, TAR 63%, TBR 0% with a GMI of 8.4%.  She notes she has been eating 3 meals per day, does not skip insulin injections, does not sleep late.  She still has problems with recurrent UTIs- currently on antibiotics.  Her PCP suggested she go to see urologist but has not made the referral just yet.  -Recent labs reviewed.  - I had a long discussion with her about the progressive nature of diabetes and the pathology behind its complications. 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 These are all discussed in detail with her.  - Nutritional counseling repeated at each appointment due to patients tendency to fall back in to old habits.  - The patient admits there is a room for improvement in their diet and drink choices. -  Suggestion is made for the patient to avoid simple carbohydrates from their diet including Cakes, Sweet Desserts / Pastries, Ice Cream, Soda (diet and regular), Sweet Tea, Candies, Chips, Cookies, Sweet Pastries, Store Bought Juices, Alcohol in Excess of 1-2 drinks a day, Artificial Sweeteners, Coffee Creamer, and "Sugar-free" Products. This will  help patient to have stable blood glucose profile and potentially avoid unintended weight gain.   - I encouraged the patient to switch to unprocessed or minimally processed complex starch and increased protein intake (animal or plant source), fruits, and vegetables.   - Patient is advised to stick to a routine mealtimes to eat 3 meals a day and avoid unnecessary snacks (to snack only to correct hypoglycemia).  - I have approached her with the following individualized plan to manage  her diabetes and patient agrees:   -She  is advised to continue her dose of Tresiba 20 units SQ nightly and adjust her Humalog to 8-14 units (to help control postprandial hyperglycemia) if glucose is above 90 and she is eating (Specific instructions on how to titrate insulin dosage based on glucose readings given to patient in writing). She can continue Metformin 500 mg po twice daily with meals (better tolerated than the full dose).   -she is encouraged to continue monitoring blood glucose 4 times daily (using her CGM), before meals and before bed, and to call the clinic if she has readings less than 70 or greater than 300 for 3 tests in a row.  - she is warned not to take insulin without proper monitoring per orders. - Adjustment parameters are given to her for hypo and hyperglycemia in writing.  -She is not a candidate for incretin therapy due to body habitus.  - Specific targets for  A1c;  LDL, HDL,  and Triglycerides were discussed with the patient.  2) Blood Pressure /Hypertension: Her BP is controlled to target.   she is advised to continue her current medications including Valsartan 80 mg p.o. daily with breakfast.  3)  Weight/Diet:  her Body mass index is 19.52 kg/m.  -  she is not a candidate for weight loss.  Exercise, and detailed carbohydrates information provided  -  detailed on discharge instructions.  4) Hypothyroidism- unspecified Her previsit TFTs are consistent with appropriate hormone replacement (only TSH drawn). She is advised to continue her current dose of Levothyroxine at 125 mcg po daily before breakfast (above her weight based max dose of 94 mcg). Likely has absorption issue.   - The correct intake of thyroid hormone (Levothyroxine, Synthroid), is on empty stomach first thing in the morning, with water, separated by at least 30 minutes from breakfast and other medications,  and separated by more than 4 hours from calcium, iron, multivitamins, acid reflux medications (PPIs).  - This medication is a life-long  medication and will be needed to correct thyroid hormone imbalances for the rest of your life.  The dose may change from time to time, based on thyroid blood work.  - It is extremely important to be consistent taking this medication, near the same time each morning.  -AVOID TAKING PRODUCTS CONTAINING BIOTIN (commonly found in Hair, Skin, Nails vitamins) AS IT INTERFERES WITH THE VALIDITY OF THYROID FUNCTION BLOOD TESTS.   5) Chronic Care/Health Maintenance: -she is on ACEI/ARB and is encouraged to initiate and continue to follow up with Ophthalmology, Dentist, Podiatrist at least yearly or according to recommendations, and advised to stay away from smoking. I have recommended yearly flu vaccine and pneumonia vaccine at least every 5 years; moderate intensity exercise for up to 150 minutes weekly; and sleep for at least 7 hours a day.  - she is advised to maintain close follow up with [name], MD for primary care needs, as well as her other providers for optimal and coordinated  care.     I spent  33  minutes in the care of the patient today including review of labs from CMP, Lipids, Thyroid Function, Hematology (current and previous including abstractions from other facilities); face-to-face time discussing  her blood glucose readings/logs, discussing hypoglycemia and hyperglycemia episodes and symptoms, medications doses, her options of short and long term treatment based on the latest standards of care / guidelines;  discussion about incorporating lifestyle medicine;  and documenting the encounter. Risk reduction counseling performed per USPSTF guidelines to reduce obesity and cardiovascular risk factors.     Please refer to Patient Instructions for Blood Glucose Monitoring and Insulin/Medications Dosing Guide"  in media tab for additional information. Please  also refer to " Patient Self Inventory" in the Media  tab for reviewed elements of pertinent patient history.  Gabrielle Wood  participated in the discussions, expressed understanding, and voiced agreement with the above plans.  All questions were answered to her satisfaction. she is encouraged to contact clinic should she have any questions or concerns prior to her return visit.   Follow up plan: - Return in about 3 months (around 10/23/2023) for Diabetes F/U with A1c in office, No previsit labs, Bring meter and logs.  Ronny Bacon, [facility] [address] [place], [place] [address] Phone: [phone] Fax: [phone]  07/23/2023, 4:04 PM

## 2023-07-31 DIAGNOSIS — E1165 Type 2 diabetes mellitus with hyperglycemia: Secondary | ICD-10-CM | POA: Diagnosis not present

## 2023-07-31 DIAGNOSIS — R93429 Abnormal radiologic findings on diagnostic imaging of unspecified kidney: Secondary | ICD-10-CM | POA: Diagnosis not present

## 2023-07-31 DIAGNOSIS — R300 Dysuria: Secondary | ICD-10-CM | POA: Diagnosis not present

## 2023-08-06 DIAGNOSIS — E7801 Familial hypercholesterolemia: Secondary | ICD-10-CM | POA: Diagnosis not present

## 2023-08-06 DIAGNOSIS — E039 Hypothyroidism, unspecified: Secondary | ICD-10-CM | POA: Diagnosis not present

## 2023-08-06 DIAGNOSIS — I10 Essential (primary) hypertension: Secondary | ICD-10-CM | POA: Diagnosis not present

## 2023-08-06 DIAGNOSIS — R599 Enlarged lymph nodes, unspecified: Secondary | ICD-10-CM | POA: Diagnosis not present

## 2023-08-06 DIAGNOSIS — M5416 Radiculopathy, lumbar region: Secondary | ICD-10-CM | POA: Diagnosis not present

## 2023-08-06 DIAGNOSIS — E1165 Type 2 diabetes mellitus with hyperglycemia: Secondary | ICD-10-CM | POA: Diagnosis not present

## 2023-08-06 DIAGNOSIS — N390 Urinary tract infection, site not specified: Secondary | ICD-10-CM | POA: Diagnosis not present

## 2023-08-06 DIAGNOSIS — F32A Depression, unspecified: Secondary | ICD-10-CM | POA: Diagnosis not present

## 2023-08-06 DIAGNOSIS — E1169 Type 2 diabetes mellitus with other specified complication: Secondary | ICD-10-CM | POA: Diagnosis not present

## 2023-08-28 DIAGNOSIS — R030 Elevated blood-pressure reading, without diagnosis of hypertension: Secondary | ICD-10-CM | POA: Diagnosis not present

## 2023-08-28 DIAGNOSIS — E1169 Type 2 diabetes mellitus with other specified complication: Secondary | ICD-10-CM | POA: Diagnosis not present

## 2023-08-28 DIAGNOSIS — R109 Unspecified abdominal pain: Secondary | ICD-10-CM | POA: Diagnosis not present

## 2023-08-28 DIAGNOSIS — Z681 Body mass index (BMI) 19 or less, adult: Secondary | ICD-10-CM | POA: Diagnosis not present

## 2023-09-17 ENCOUNTER — Encounter: Payer: Self-pay | Admitting: Internal Medicine

## 2023-09-30 ENCOUNTER — Other Ambulatory Visit: Payer: Self-pay

## 2023-09-30 ENCOUNTER — Emergency Department (HOSPITAL_COMMUNITY): Payer: Medicare HMO

## 2023-09-30 ENCOUNTER — Emergency Department (HOSPITAL_COMMUNITY)
Admission: EM | Admit: 2023-09-30 | Discharge: 2023-09-30 | Disposition: A | Discharge status: Home / Self Care | Payer: Medicare HMO

## 2023-09-30 ENCOUNTER — Encounter (HOSPITAL_COMMUNITY): Payer: Self-pay

## 2023-09-30 DIAGNOSIS — R030 Elevated blood-pressure reading, without diagnosis of hypertension: Secondary | ICD-10-CM | POA: Diagnosis not present

## 2023-09-30 DIAGNOSIS — Z20828 Contact with and (suspected) exposure to other viral communicable diseases: Secondary | ICD-10-CM | POA: Diagnosis not present

## 2023-09-30 DIAGNOSIS — Z681 Body mass index (BMI) 19 or less, adult: Secondary | ICD-10-CM | POA: Diagnosis not present

## 2023-09-30 DIAGNOSIS — I2510 Atherosclerotic heart disease of native coronary artery without angina pectoris: Secondary | ICD-10-CM | POA: Diagnosis not present

## 2023-09-30 DIAGNOSIS — J441 Chronic obstructive pulmonary disease with (acute) exacerbation: Secondary | ICD-10-CM | POA: Diagnosis not present

## 2023-09-30 DIAGNOSIS — J449 Chronic obstructive pulmonary disease, unspecified: Secondary | ICD-10-CM | POA: Diagnosis not present

## 2023-09-30 DIAGNOSIS — Z7982 Long term (current) use of aspirin: Secondary | ICD-10-CM | POA: Diagnosis not present

## 2023-09-30 DIAGNOSIS — Z794 Long term (current) use of insulin: Secondary | ICD-10-CM | POA: Diagnosis not present

## 2023-09-30 DIAGNOSIS — Z1152 Encounter for screening for COVID-19: Secondary | ICD-10-CM | POA: Insufficient documentation

## 2023-09-30 DIAGNOSIS — J189 Pneumonia, unspecified organism: Secondary | ICD-10-CM | POA: Diagnosis not present

## 2023-09-30 DIAGNOSIS — R0602 Shortness of breath: Secondary | ICD-10-CM | POA: Diagnosis present

## 2023-09-30 DIAGNOSIS — R059 Cough, unspecified: Secondary | ICD-10-CM | POA: Diagnosis not present

## 2023-09-30 DIAGNOSIS — R079 Chest pain, unspecified: Secondary | ICD-10-CM | POA: Diagnosis not present

## 2023-09-30 LAB — CBC
HCT: 40.3 % (ref 36.0–46.0)
Hemoglobin: 13.5 g/dL (ref 12.0–15.0)
MCH: 30.3 pg (ref 26.0–34.0)
MCHC: 33.5 g/dL (ref 30.0–36.0)
MCV: 90.6 fL (ref 80.0–100.0)
Platelets: 184 10*3/uL (ref 150–400)
RBC: 4.45 MIL/uL (ref 3.87–5.11)
RDW: 16.4 % — ABNORMAL HIGH (ref 11.5–15.5)
WBC: 6.2 10*3/uL (ref 4.0–10.5)
nRBC: 0 % (ref 0.0–0.2)

## 2023-09-30 LAB — RESP PANEL BY RT-PCR (RSV, FLU A&B, COVID)  RVPGX2
Influenza A by PCR: NEGATIVE
Influenza B by PCR: NEGATIVE
Resp Syncytial Virus by PCR: NEGATIVE
SARS Coronavirus 2 by RT PCR: NEGATIVE

## 2023-09-30 LAB — BASIC METABOLIC PANEL
Anion gap: 9 (ref 5–15)
BUN: 16 mg/dL (ref 8–23)
CO2: 26 mmol/L (ref 22–32)
Calcium: 9.8 mg/dL (ref 8.9–10.3)
Chloride: 99 mmol/L (ref 98–111)
Creatinine, Ser: 0.68 mg/dL (ref 0.44–1.00)
GFR, Estimated: >60 mL/min (ref >60)
Glucose, Bld: 219 mg/dL — ABNORMAL HIGH (ref 70–99)
Potassium: 3.8 mmol/L (ref 3.5–5.1)
Sodium: 134 mmol/L — ABNORMAL LOW (ref 135–145)

## 2023-09-30 LAB — TROPONIN I (HIGH SENSITIVITY)
Troponin I (High Sensitivity): 6 ng/L (ref <18)
Troponin I (High Sensitivity): 7 ng/L (ref <18)

## 2023-09-30 MED ORDER — DOXYCYCLINE HYCLATE 100 MG PO TABS
100.0000 mg | ORAL_TABLET | Freq: Once | ORAL | Status: AC
  Administered 2023-09-30: 100 mg via ORAL
  Filled 2023-09-30: qty 1

## 2023-09-30 MED ORDER — ALBUTEROL SULFATE HFA 108 (90 BASE) MCG/ACT IN AERS
1.0000 | INHALATION_SPRAY | RESPIRATORY_TRACT | 0 refills | Status: AC | PRN
Start: 2023-09-30 — End: ?

## 2023-09-30 MED ORDER — IPRATROPIUM-ALBUTEROL 0.5-2.5 (3) MG/3ML IN SOLN
3.0000 mL | Freq: Once | RESPIRATORY_TRACT | Status: AC
  Administered 2023-09-30: 3 mL via RESPIRATORY_TRACT
  Filled 2023-09-30: qty 3

## 2023-09-30 MED ORDER — AMOXICILLIN-POT CLAVULANATE 875-125 MG PO TABS
1.0000 | ORAL_TABLET | Freq: Once | ORAL | Status: AC
  Administered 2023-09-30: 1 via ORAL
  Filled 2023-09-30: qty 1

## 2023-09-30 MED ORDER — PREDNISONE 50 MG PO TABS
60.0000 mg | ORAL_TABLET | Freq: Once | ORAL | Status: AC
  Administered 2023-09-30: 60 mg via ORAL
  Filled 2023-09-30: qty 1

## 2023-09-30 MED ORDER — AMOXICILLIN-POT CLAVULANATE 875-125 MG PO TABS: 1.0000 | ORAL_TABLET | Freq: Two times a day (BID) | ORAL | 0 refills | Status: DC

## 2023-09-30 MED ORDER — PREDNISONE 50 MG PO TABS: ORAL_TABLET | ORAL | 0 refills | Status: DC

## 2023-09-30 MED ORDER — ALBUTEROL SULFATE (2.5 MG/3ML) 0.083% IN NEBU
2.5000 mg | INHALATION_SOLUTION | Freq: Four times a day (QID) | RESPIRATORY_TRACT | 12 refills | Status: DC | PRN
Start: 2023-09-30 — End: 2024-10-07

## 2023-09-30 NOTE — ED Provider Notes (Signed)
[place] EMERGENCY DEPARTMENT AT [facility] Provider Note   CSN: [id number] Arrival date & time: 09/30/23  1316     History  Chief Complaint  Patient presents with   Cough    Gabrielle Wood is a 77 y.o. female.  Based on the description of 77 year old female with past medical history of COPD, coronary artery disease, and remote history of DVT presenting to the emergency department today with cough and shortness of breath.  The patient states that she has been having some increasing dyspnea on exertion as well as productive cough over the past few days.  Her cough is productive of yellow sputum.  She states that this is gradually worsened over the past few days.  She went to her primary care provider today and was subsequently sent to the emergency department for further evaluation.  The patient states that she was having some burning discomfort mostly when she coughs.  She denies any pleuritic pain.  Denies any chest pain with exertion.  She states this been on now for the past few days.  She denies any fevers.  She came to the ER today for further evaluation after being sent by her primary care provider.   Cough Associated symptoms: wheezing        Home Medications Prior to Admission medications   Medication Sig Start Date End Date Taking? Authorizing Provider  albuterol (PROVENTIL) (2.5 MG/3ML) 0.083% nebulizer solution Take 3 mLs (2.5 mg total) by nebulization every 6 (six) hours as needed for wheezing or shortness of breath. 09/30/23  Yes Durwin Glaze, MD  albuterol (VENTOLIN HFA) 108 (90 Base) MCG/ACT inhaler Inhale 1-2 puffs into the lungs every 4 (four) hours as needed for wheezing or shortness of breath. 09/30/23  Yes Durwin Glaze, MD  amoxicillin-clavulanate (AUGMENTIN) 875-125 MG tablet Take 1 tablet by mouth every 12 (twelve) hours. 09/30/23  Yes Durwin Glaze, MD  predniSONE (DELTASONE) 50 MG tablet Take 1 tablet by mouth daily 09/30/23  Yes Durwin Glaze, MD  ALPRAZolam Prudy Feeler) 1 MG tablet Take 0.5-1 mg by mouth. 0.5 tablet in am and 0.5 tab at night 04/11/21   [provider]  amLODipine (NORVASC) 5 MG tablet Take 5 mg by mouth daily. 04/20/21   [provider]  aspirin EC 81 MG tablet Take 81 mg by mouth daily. Swallow whole.    [provider]  atorvastatin (LIPITOR) 80 MG tablet Take 80 mg by mouth every evening. 11/24/21   [provider]  Continuous Glucose Sensor (FREESTYLE LIBRE 3 SENSOR) MISC Change sensor every 14 days. 04/09/23   Dani Gobble, NP  dicyclomine (BENTYL) 20 MG tablet Take 1 tablet (20 mg total) by mouth 2 (two) times daily. 07/05/22   Honor Loh M, PA-C  gabapentin (NEURONTIN) 300 MG capsule Take 300 mg by mouth 3 (three) times daily. 07/29/21   [provider]  insulin lispro (HUMALOG KWIKPEN) 100 UNIT/ML KwikPen Inject 8-14 Units into the skin 3 (three) times daily. 07/23/23   Dani Gobble, NP  levocetirizine (XYZAL) 5 MG tablet Take 1 tablet (5 mg total) by mouth every evening. 04/13/21   Eber Hong, MD  levothyroxine (SYNTHROID) 125 MCG tablet TAKE ONE TABLET BY MOUTH DAILY (ONE HOUR BEFORE FOOD OR DRINK) 04/10/23   Dani Gobble, NP  loperamide (IMODIUM) 2 MG capsule Take 2 mg by mouth 4 (four) times daily as needed. 06/28/21   [provider]  metFORMIN (GLUCOPHAGE) 500 MG  tablet Take 1 tablet (500 mg total) by mouth 2 (two) times daily with a meal. 07/23/23   Dani Gobble, NP  metoprolol succinate (TOPROL-XL) 50 MG 24 hr tablet Take 1 tablet (50 mg total) by mouth every evening. Take with or immediately following a meal. 10/17/21 07/23/23  Shahmehdi, Gemma Payor, MD  nitroGLYCERIN (NITROSTAT) 0.4 MG SL tablet Place 0.4 mg under the tongue every 5 (five) minutes as needed for chest pain.    [provider]  ondansetron (ZOFRAN-ODT) 4 MG disintegrating tablet Take 4 mg by mouth every 8 (eight) hours as needed. 06/27/21   [provider]   pantoprazole (PROTONIX) 40 MG tablet Take 40 mg by mouth daily. 06/06/21   [provider]  ticagrelor (BRILINTA) 90 MG TABS tablet Take 90 mg by mouth 2 (two) times daily.    [provider]  TRESIBA FLEXTOUCH 100 UNIT/ML FlexTouch Pen Inject 20 Units into the skin at bedtime. 04/09/23   Dani Gobble, NP  valsartan (DIOVAN) 80 MG tablet Take 40 mg by mouth daily.    [provider]  vitamin C (ASCORBIC ACID) 500 MG tablet Take 500 mg by mouth daily. Patient not taking: Reported on 07/23/2023    [provider]  fexofenadine (ALLEGRA) 60 MG tablet Take by mouth.  04/13/21  [provider]      Allergies    Amlodipine, Ciprocin-fluocin-procin [fluocinolone], Lipitor [atorvastatin], Nebivolol, Nitrofuran derivatives, Lisinopril, and Red yeast rice [cholestin]    Review of Systems   Review of Systems  Respiratory:  Positive for cough and wheezing.   All other systems reviewed and are negative.   Physical Exam Updated Vital Signs BP (!) 161/79   Pulse 73   Temp 98.1 F (36.7 C) (Oral)   Resp 18   Wt 56 kg   SpO2 94%   BMI 19.34 kg/m  Physical Exam Vitals and nursing note reviewed.   Gen: NAD, bronchospastic cough noted intermittently throughout interview Eyes: PERRL, EOMI HEENT: no oropharyngeal swelling Neck: trachea midline Resp: Manage with diffuse wheezes throughout all lung fields Card: RRR, no murmurs, rubs, or gallops Abd: nontender, nondistended Extremities: no calf tenderness, no edema Vascular: 2+ radial pulses bilaterally, 2+ DP pulses bilaterally Skin: no rashes Psyc: acting appropriately   ED Results / Procedures / Treatments   Labs (all labs ordered are listed, but only abnormal results are displayed) Labs Reviewed  BASIC METABOLIC PANEL - Abnormal; Notable for the following components:      Result Value   Sodium 134 (*)    Glucose, Bld 219 (*)    All other components within normal limits  CBC - Abnormal;  Notable for the following components:   RDW 16.4 (*)    All other components within normal limits  RESP PANEL BY RT-PCR (RSV, FLU A&B, COVID)  RVPGX2  TROPONIN I (HIGH SENSITIVITY)  TROPONIN I (HIGH SENSITIVITY)    EKG EKG Interpretation Date/Time:  Monday September 30 2023 13:39:37 EDT Ventricular Rate:  66 PR Interval:  158 QRS Duration:  82 QT Interval:  398 QTC Calculation: 417 R Axis:   -60  Text Interpretation: Normal sinus rhythm Pulmonary disease pattern Left anterior fascicular block Nonspecific ST and T wave abnormality Abnormal ECG When compared with ECG of 27-May-2022 10:25, PREVIOUS ECG IS PRESENT Confirmed by Beckey Downing [phone]) on 09/30/2023 5:29:46 PM  Radiology DG Chest 2 View  Result Date: 09/30/2023 CLINICAL DATA:  Chest pain, cough. EXAM: CHEST - 2 VIEW COMPARISON:  May 28, 2023. FINDINGS: The heart size and mediastinal contours are within normal limits. Both lungs are clear. Hyperinflation of the lungs is noted. The visualized skeletal structures are unremarkable. IMPRESSION: No active cardiopulmonary disease.  Hyperinflation of the lungs. Electronically Signed   By: Lupita Raider M.D.   On: 09/30/2023 16:26    Procedures Procedures    Medications Ordered in ED Medications  doxycycline (VIBRA-TABS) tablet 100 mg (has no administration in time range)  predniSONE (DELTASONE) tablet 60 mg (60 mg Oral Given 09/30/23 1820)  ipratropium-albuterol (DUONEB) 0.5-2.5 (3) MG/3ML nebulizer solution 3 mL (3 mLs Nebulization Given 09/30/23 1755)  ipratropium-albuterol (DUONEB) 0.5-2.5 (3) MG/3ML nebulizer solution 3 mL (3 mLs Nebulization Given 09/30/23 1755)  ipratropium-albuterol (DUONEB) 0.5-2.5 (3) MG/3ML nebulizer solution 3 mL (3 mLs Nebulization Given 09/30/23 1754)  amoxicillin-clavulanate (AUGMENTIN) 875-125 MG per tablet 1 tablet (1 tablet Oral Given 09/30/23 1820)  doxycycline (VIBRA-TABS) tablet 100 mg (100 mg Oral Given 09/30/23 1820)    ED Course/  Medical Decision Making/ A&P                                 Medical Decision Making 77 year old female with past medical history of COPD, coronary artery disease, and remote history of DVT in the past presenting to the emergency department today with cough and wheezing.  I will further evaluate the patient here with basic labs Wels and EKG, chest x-ray, and troponin for further evaluation for ACS, pulmonary edema, pulmonary infiltrates, or pneumothorax.  Also obtain a COVID and flu swab on the patient.  The patient does present with symptoms consistent with likely COPD exacerbation.  I will give her prednisone here as well as DuoNebs.  Given her age and history of COPD I will also give her Augmentin and doxycycline to cover for possible community-acquired pneumonia.  Her pulse ox on arrival is 90%.  I will reevaluate after initial workup to determine ultimate disposition.  Her symptoms, wheezing on exam, and productive cough suspicion for pulmonary embolism is low at this time.  The patient is feeling better after nebulizer treatments.  Her x-ray is unremarkable.  Labs are reassuring.  Her pulse ox today between 9094% with ambulation as she is feeling much better.  We did discuss admission to the hospital versus going home as her O2 saturations at baseline are around 90 to 94%.  Ultimately, she is discharged through shared decision making with return precautions.  Amount and/or Complexity of Data Reviewed Labs: ordered. Radiology: ordered.  Risk Prescription drug management.           Final Clinical Impression(s) / ED Diagnoses Final diagnoses:  COPD exacerbation (HCC)    Rx / DC Orders ED Discharge Orders          Ordered    amoxicillin-clavulanate (AUGMENTIN) 875-125 MG tablet  Every 12 hours        09/30/23 2014    predniSONE (DELTASONE) 50 MG tablet        09/30/23 2014    albuterol (VENTOLIN HFA) 108 (90 Base) MCG/ACT inhaler  Every 4 hours PRN        09/30/23 2014     albuterol (PROVENTIL) (2.5 MG/3ML) 0.083% nebulizer solution  Every 6 hours PRN        09/30/23 2014              Durwin Glaze, MD 09/30/23 2014

## 2023-09-30 NOTE — ED Triage Notes (Signed)
C/o burning sensation to chest with runny nose, weakness, productive cough x3 days.  Denies sob/cp

## 2023-09-30 NOTE — Discharge Instructions (Signed)
Your workup today was reassuring.  Please take the prednisone daily.  Take the Augmentin and doxycycline twice daily.  Please use either 2 puffs of your albuterol or 1 nebulizer treatment every 4 hours over the next 2 to 3 days.  Please follow-up with your doctor.  Return to the ER for worsening symptoms.

## 2023-09-30 NOTE — ED Provider Triage Note (Signed)
Emergency Medicine Provider Triage Evaluation Note  Gabrielle Wood , a 77 y.o. female  was evaluated in triage.  Pt complains of a cough for the past week.. Pt has a history of Copd  Review of Systems  Positive: cough Negative: fever  Physical Exam  BP (!) 142/97   Pulse 70   Temp 98.2 F (36.8 C) (Oral)   Resp 18   Wt 56 kg   SpO2 90%   BMI 19.34 kg/m  Gen:   Awake, no distress   Resp:  Normal effort  MSK:   Moves extremities without difficulty  Other:    Medical Decision Making  Medically screening exam initiated at 3:00 PM.  Appropriate orders placed.  Gabrielle Wood was informed that the remainder of the evaluation will be completed by another provider, this initial triage assessment does not replace that evaluation, and the importance of remaining in the ED until their evaluation is complete.     Elson Areas, [place] 09/30/23 1501

## 2023-09-30 NOTE — ED Notes (Signed)
Pt ambulated in the room with standby assist due to not having her personal cane. Pts O2 sats remained between 90-94% with ambulation. Denies dyspnea nor dizziness while ambulating.

## 2023-10-04 DIAGNOSIS — J189 Pneumonia, unspecified organism: Secondary | ICD-10-CM | POA: Diagnosis not present

## 2023-10-04 DIAGNOSIS — Z681 Body mass index (BMI) 19 or less, adult: Secondary | ICD-10-CM | POA: Diagnosis not present

## 2023-10-04 DIAGNOSIS — Z1389 Encounter for screening for other disorder: Secondary | ICD-10-CM | POA: Diagnosis not present

## 2023-10-04 DIAGNOSIS — R030 Elevated blood-pressure reading, without diagnosis of hypertension: Secondary | ICD-10-CM | POA: Diagnosis not present

## 2023-10-04 DIAGNOSIS — Z1331 Encounter for screening for depression: Secondary | ICD-10-CM | POA: Diagnosis not present

## 2023-10-04 DIAGNOSIS — E860 Dehydration: Secondary | ICD-10-CM | POA: Diagnosis not present

## 2023-10-04 DIAGNOSIS — J441 Chronic obstructive pulmonary disease with (acute) exacerbation: Secondary | ICD-10-CM | POA: Diagnosis not present

## 2023-10-07 DIAGNOSIS — R030 Elevated blood-pressure reading, without diagnosis of hypertension: Secondary | ICD-10-CM | POA: Diagnosis not present

## 2023-10-07 DIAGNOSIS — F17210 Nicotine dependence, cigarettes, uncomplicated: Secondary | ICD-10-CM | POA: Diagnosis not present

## 2023-10-07 DIAGNOSIS — J441 Chronic obstructive pulmonary disease with (acute) exacerbation: Secondary | ICD-10-CM | POA: Diagnosis not present

## 2023-10-14 DIAGNOSIS — R531 Weakness: Secondary | ICD-10-CM | POA: Diagnosis not present

## 2023-10-14 DIAGNOSIS — E1165 Type 2 diabetes mellitus with hyperglycemia: Secondary | ICD-10-CM | POA: Diagnosis not present

## 2023-10-14 DIAGNOSIS — J441 Chronic obstructive pulmonary disease with (acute) exacerbation: Secondary | ICD-10-CM | POA: Diagnosis not present

## 2023-10-14 DIAGNOSIS — Z681 Body mass index (BMI) 19 or less, adult: Secondary | ICD-10-CM | POA: Diagnosis not present

## 2023-10-18 ENCOUNTER — Other Ambulatory Visit: Payer: Self-pay | Admitting: Nurse Practitioner

## 2023-10-18 DIAGNOSIS — E039 Hypothyroidism, unspecified: Secondary | ICD-10-CM

## 2023-10-18 DIAGNOSIS — E11649 Type 2 diabetes mellitus with hypoglycemia without coma: Secondary | ICD-10-CM

## 2023-10-21 DIAGNOSIS — Z862 Personal history of diseases of the blood and blood-forming organs and certain disorders involving the immune mechanism: Secondary | ICD-10-CM | POA: Diagnosis not present

## 2023-10-21 DIAGNOSIS — E1165 Type 2 diabetes mellitus with hyperglycemia: Secondary | ICD-10-CM | POA: Diagnosis not present

## 2023-10-21 DIAGNOSIS — I10 Essential (primary) hypertension: Secondary | ICD-10-CM | POA: Diagnosis not present

## 2023-10-21 DIAGNOSIS — E039 Hypothyroidism, unspecified: Secondary | ICD-10-CM | POA: Diagnosis not present

## 2023-10-21 LAB — COMPREHENSIVE METABOLIC PANEL: eGFR: 73

## 2023-10-21 LAB — TSH: TSH: 5.3 (ref 0.41–5.90)

## 2023-10-21 LAB — BASIC METABOLIC PANEL
BUN: 12 (ref 4–21)
Creatinine: 0.8 (ref 0.5–1.1)
Glucose: 127

## 2023-10-21 LAB — HEMOGLOBIN A1C: Hemoglobin A1C: 10.7

## 2023-10-22 DIAGNOSIS — H903 Sensorineural hearing loss, bilateral: Secondary | ICD-10-CM | POA: Diagnosis not present

## 2023-10-23 ENCOUNTER — Encounter: Payer: Self-pay | Admitting: Nurse Practitioner

## 2023-10-23 ENCOUNTER — Ambulatory Visit (INDEPENDENT_AMBULATORY_CARE_PROVIDER_SITE_OTHER): Payer: PPO | Admitting: Nurse Practitioner

## 2023-10-23 VITALS — BP 101/66 | HR 82 | Ht 67.0 in | Wt 123.6 lb

## 2023-10-23 DIAGNOSIS — Z794 Long term (current) use of insulin: Secondary | ICD-10-CM | POA: Diagnosis not present

## 2023-10-23 DIAGNOSIS — E559 Vitamin D deficiency, unspecified: Secondary | ICD-10-CM | POA: Diagnosis not present

## 2023-10-23 DIAGNOSIS — E11649 Type 2 diabetes mellitus with hypoglycemia without coma: Secondary | ICD-10-CM

## 2023-10-23 DIAGNOSIS — E039 Hypothyroidism, unspecified: Secondary | ICD-10-CM

## 2023-10-23 DIAGNOSIS — Z7984 Long term (current) use of oral hypoglycemic drugs: Secondary | ICD-10-CM | POA: Diagnosis not present

## 2023-10-23 NOTE — Patient Instructions (Signed)

## 2023-10-23 NOTE — Progress Notes (Signed)
Endocrinology Follow Up Note       10/23/2023, 3:16 PM   Subjective:    Patient ID: Gabrielle Wood, female    DOB: 12-03-1946.  Gabrielle Wood is being seen in follow up after being seen in consultation for management of currently uncontrolled symptomatic diabetes requested by  [name], MD.   Past Medical History:  Diagnosis Date   Arthritis    COPD (chronic obstructive pulmonary disease) (HCC)    Diabetes mellitus    DVT (deep venous thrombosis) (HCC)    years ago   GERD (gastroesophageal reflux disease)    H. pylori infection 2013   S/p treatment with amoxicillin, Biaxin, PPI   High cholesterol    Hypertension    Hypothyroidism    NSTEMI (non-ST elevated myocardial infarction) (HCC) 12/2020   s/p  PCI DES to the LCx   Thyroid disease     Past Surgical History:  Procedure Laterality Date   BACK SURGERY     lumbar x3    ESOPHAGOGASTRODUODENOSCOPY  04/04/2012   Moderate gastritis/MODERATE Duodenitis in the duodenal bulb duodenum.  Pathology with chronic H. pylori gastritis.   EUS  05/14/2012   Normal pancreas, gallbladder,biliary tree/ Incidental finding of simple-appearing right renal cyst.   TONSILLECTOMY     TUBAL LIGATION     VEIN SURGERY     stripping years ago but recent laser surgery; bilateral legs    Social History   Socioeconomic History   Marital status: Widowed    Spouse name: Not on file   Number of children: 2   Years of education: Not on file   Highest education level: Not on file  Occupational History   Occupation: retired, Advertising copywriter at [employer]  Tobacco Use   Smoking status: Former    Current packs/day: 0.50    Types: Cigarettes   Smokeless tobacco: Never  Vaping Use   Vaping status: Never Used  Substance and Sexual Activity   Alcohol use: No   Drug use: No   Sexual activity: Not on file  Other Topics Concern   Not on file  Social History Narrative    Not on file   Social Determinants of Health   Financial Resource Strain: Low Risk  (11/05/2022)   Received from [facility], [facility]   Overall Financial Resource Strain (CARDIA)    Difficulty of Paying Living Expenses: Not hard at all  Food Insecurity: No Food Insecurity (11/05/2022)   Received from [facility], [facility]   Hunger Vital Sign    Worried About Running Out of Food in the Last Year: Never true    Ran Out of Food in the Last Year: Never true  Transportation Needs: No Transportation Needs (11/05/2022)   Received from [facility], [facility]   [facility] - Transportation    Lack of Transportation (Medical): No    Lack of Transportation (Non-Medical): No  Physical Activity: Not on file  Stress: Not on file  Social Connections: Unknown (04/15/2022)   Received from [facility], [facility]   Social Network    Social Network: Not on file    Family History  Problem Relation  Age of Onset   Pancreatic cancer Mother        diagnosed at age 44s, died from heart attack   Inflammatory bowel disease Brother        crohn's, his dgt deceased from colon cancer at age 12   Colon cancer Neg Hx     Outpatient Encounter Medications as of 10/23/2023  Medication Sig   albuterol (PROVENTIL) (2.5 MG/3ML) 0.083% nebulizer solution Take 3 mLs (2.5 mg total) by nebulization every 6 (six) hours as needed for wheezing or shortness of breath.   albuterol (VENTOLIN HFA) 108 (90 Base) MCG/ACT inhaler Inhale 1-2 puffs into the lungs every 4 (four) hours as needed for wheezing or shortness of breath.   ALPRAZolam (XANAX) 1 MG tablet Take 0.5-1 mg by mouth. 0.5 tablet in am and 0.5 tab at night   amLODipine (NORVASC) 5 MG tablet Take 5 mg by mouth daily.   aspirin EC 81 MG tablet Take 81 mg by mouth daily. Swallow whole.   atorvastatin (LIPITOR) 80 MG tablet Take 80 mg by mouth every evening.   Continuous Glucose Sensor (FREESTYLE LIBRE 3 SENSOR) MISC Change sensor  every 14 days.   dicyclomine (BENTYL) 20 MG tablet Take 1 tablet (20 mg total) by mouth 2 (two) times daily.   gabapentin (NEURONTIN) 300 MG capsule Take 300 mg by mouth 3 (three) times daily.   insulin lispro (HUMALOG KWIKPEN) 100 UNIT/ML KwikPen Inject 8-14 Units into the skin 3 (three) times daily.   levocetirizine (XYZAL) 5 MG tablet Take 1 tablet (5 mg total) by mouth every evening.   levothyroxine (SYNTHROID) 125 MCG tablet TAKE ONE TABLET BY MOUTH DAILY (ONE HOUR BEFORE FOOD OR DRINK)   nitroGLYCERIN (NITROSTAT) 0.4 MG SL tablet Place 0.4 mg under the tongue every 5 (five) minutes as needed for chest pain.   ondansetron (ZOFRAN-ODT) 4 MG disintegrating tablet Take 4 mg by mouth every 8 (eight) hours as needed.   pantoprazole (PROTONIX) 40 MG tablet Take 40 mg by mouth daily.   ticagrelor (BRILINTA) 90 MG TABS tablet Take 90 mg by mouth 2 (two) times daily.   TRESIBA FLEXTOUCH 100 UNIT/ML FlexTouch Pen Inject 20 Units into the skin at bedtime.   valsartan (DIOVAN) 80 MG tablet Take 40 mg by mouth daily.   vitamin C (ASCORBIC ACID) 500 MG tablet Take 500 mg by mouth daily.   [DISCONTINUED] metFORMIN (GLUCOPHAGE) 500 MG tablet Take 1 tablet (500 mg total) by mouth 2 (two) times daily with a meal. (Patient taking differently: Take 500 mg by mouth 2 (two) times daily with a meal. Patient is taking only one by mouth daily. Due to it causing Diarrhea)   amoxicillin-clavulanate (AUGMENTIN) 875-125 MG tablet Take 1 tablet by mouth every 12 (twelve) hours. (Patient not taking: Reported on 10/23/2023)   loperamide (IMODIUM) 2 MG capsule Take 2 mg by mouth 4 (four) times daily as needed. (Patient not taking: Reported on 10/23/2023)   metoprolol succinate (TOPROL-XL) 50 MG 24 hr tablet Take 1 tablet (50 mg total) by mouth every evening. Take with or immediately following a meal.   [DISCONTINUED] fexofenadine (ALLEGRA) 60 MG tablet Take by mouth.   [DISCONTINUED] predniSONE (DELTASONE) 50 MG tablet Take 1  tablet by mouth daily (Patient not taking: Reported on 10/23/2023)   No facility-administered encounter medications on file as of 10/23/2023.    ALLERGIES: Allergies  Allergen Reactions   Amlodipine    Ciprocin-Fluocin-Procin [Fluocinolone]     Nightmares   Lipitor [Atorvastatin] Other (  See Comments)    Muscle aches, pain   Nebivolol    Nitrofuran Derivatives    Lisinopril Cough   Red Yeast Rice [Cholestin] Itching and Rash    VACCINATION STATUS: Immunization History  Administered Date(s) Administered   Pneumococcal Polysaccharide-23 04/04/2012    Diabetes She presents for her follow-up diabetic visit. She has type 2 diabetes mellitus. Onset time: Was diagnosed at approx age of 72. Her disease course has been improving. Hypoglycemia symptoms include nervousness/anxiousness, sweats and tremors. Associated symptoms include fatigue and foot paresthesias. Pertinent negatives for diabetes include no polydipsia, no polyuria and no weight loss. There are no hypoglycemic complications. Symptoms are improving. Diabetic complications include heart disease and peripheral neuropathy. (Had a MI in the past, recently hospitalized for DKA) Risk factors for coronary artery disease include diabetes mellitus, dyslipidemia, hypertension, post-menopausal and sedentary lifestyle. Current diabetic treatment includes oral agent (monotherapy) and intensive insulin program. She is compliant with treatment most of the time. Her weight is fluctuating minimally. She is following a generally healthy diet. When asked about meal planning, she reported none. She has not had a previous visit with a dietitian (Has appt with Boyd Kerbs after me today). She participates in exercise intermittently. Her home blood glucose trend is decreasing steadily. Her overall blood glucose range is >200 mg/dl. (She presents today, accompanied by her sister [name], with her CGM and logs showing slowly improving glycemic profile.  Her sister notes  she has been really sick and she decided to move [place] in with her to help care for her.  Since then she has regained strength and she has been taking her medications properly.  Her most recent A1c on 11/18 was 10.7% at her PCP office.  She notes she has been on/off antibiotics and is having diarrhea as well.  Analysis of her CGM shows TIR 31%, TAR 68%, TBR 1% with a GMI of 9%.) An ACE inhibitor/angiotensin II receptor blocker is being taken. She does not see a podiatrist.Eye exam is current.  Hypertension This is a chronic problem. The current episode started more than 1 year ago. The problem has been resolved since onset. The problem is controlled. Associated symptoms include sweats. Agents associated with hypertension include thyroid hormones. Risk factors for coronary artery disease include diabetes mellitus, dyslipidemia, post-menopausal state and sedentary lifestyle. Past treatments include angiotensin blockers, calcium channel blockers and beta blockers. The current treatment provides significant improvement. There are no compliance problems.  Hypertensive end-organ damage includes CAD/MI. Identifiable causes of hypertension include a thyroid problem.     Review of systems  Constitutional: + stable body weight,  current Body mass index is 19.36 kg/m. , + fatigue- deconditioned, no subjective hyperthermia, no subjective hypothermia Eyes: no blurry vision, no xerophthalmia ENT: no sore throat, no nodules palpated in throat, no dysphagia/odynophagia, no hoarseness Cardiovascular: no chest pain, no shortness of breath, no palpitations, no leg swelling Respiratory: no cough, no shortness of breath Gastrointestinal: no nausea/vomiting, + intermittent diarrhea with incontinence Genitourinary: +polyuria with incontinence and recurrent UTIs Musculoskeletal: + generalized weakness-using walker Skin: no rashes, no hyperemia Neurological: no tremors, + numbness/tingling to BLE, no  dizziness Psychiatric: no depression, no anxiety  Objective:     BP 101/66 (BP Location: Right Arm, Patient Position: Sitting, Cuff Size: Large)   Pulse 82   Ht 5\' 7"  (1.702 m)   Wt 123 lb 9.6 oz (56.1 kg)   BMI 19.36 kg/m   Wt Readings from Last 3 Encounters:  10/23/23 123 lb 9.6 oz (56.1  kg)  09/30/23 123 lb 7.3 oz (56 kg)  07/23/23 124 lb 9.6 oz (56.5 kg)     BP Readings from Last 3 Encounters:  10/23/23 101/66  09/30/23 (!) 183/83  07/23/23 114/72      Physical Exam- Limited  Constitutional:  Body mass index is 19.36 kg/m. , not in acute distress, normal state of mind Eyes:  EOMI, no exophthalmos Neck: Supple Musculoskeletal: no gross deformities, strength intact in all four extremities, no gross restriction of joint movements, walks with cane/walker Skin:  no rashes, no hyperemia Neurological: no tremor with outstretched hands   Diabetic Foot Exam - Simple   No data filed      CMP ( most recent) CMP     Component Value Date/Time   NA 134 (L) 09/30/2023 1410   NA 139 04/25/2021 0000   K 3.8 09/30/2023 1410   CL 99 09/30/2023 1410   CO2 26 09/30/2023 1410   GLUCOSE 219 (H) 09/30/2023 1410   BUN 12 10/21/2023 0000   CREATININE 0.8 10/21/2023 0000   CREATININE 0.68 09/30/2023 1410   CREATININE 0.78 09/20/2016 0923   CALCIUM 9.8 09/30/2023 1410   PROT 7.1 07/05/2022 1100   PROT 6.7 07/17/2017 0912   ALBUMIN 4.0 07/05/2022 1100   ALBUMIN 4.4 07/17/2017 0912   AST 17 07/05/2022 1100   ALT 15 07/05/2022 1100   ALKPHOS 56 07/05/2022 1100   BILITOT 0.7 07/05/2022 1100   BILITOT 0.5 07/17/2017 0912   GFRNONAA >60 09/30/2023 1410   GFRNONAA 83 07/10/2023 1107   GFRNONAA 77 09/20/2016 0923   GFRAA >60 08/21/2019 1539   GFRAA 88 09/20/2016 0923     Diabetic Labs (most recent): Lab Results  Component Value Date   HGBA1C 10.7 10/21/2023   HGBA1C 9.8 (A) 07/23/2023   HGBA1C 9.4 (A) 04/09/2023   MICROALBUR 150 02/21/2021      Lab Results   Component Value Date   TSH 5.30 10/21/2023   TSH 1.68 01/30/2022   TSH 1.62 07/24/2021   TSH 11.00 (A) 04/25/2021   TSH 19.240 (H) 07/17/2017   TSH 1.91 09/20/2016   TSH 3.05 06/14/2016   TSH 2.86 03/12/2016   TSH 4.280 04/04/2012   FREET4 1.53 07/17/2017   FREET4 1.5 09/20/2016   FREET4 1.6 06/14/2016   FREET4 1.5 03/12/2016     Assessment & Plan:   1) Uncontrolled type 2 diabetes mellitus with circulatory disorder, with long-term current use of insulin (HCC)  - Gabrielle Wood has currently uncontrolled symptomatic type 2 DM since 77 years of age.  She presents today, accompanied by her sister [name], with her CGM and logs showing slowly improving glycemic profile.  Her sister notes she has been really sick and she decided to move [place] in with her to help care for her.  Since then she has regained strength and she has been taking her medications properly.  Her most recent A1c on 11/18 was 10.7% at her PCP office.  She notes she has been on/off antibiotics and is having diarrhea as well.  Analysis of her CGM shows TIR 31%, TAR 68%, TBR 1% with a GMI of 9%.  -Recent labs reviewed.  - I had a long discussion with her about the progressive nature of diabetes and the pathology behind its complications. 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 These are all  discussed in detail with her.  - Nutritional counseling repeated at each appointment due to patients tendency to fall back in to old habits.  - The patient admits there is a room for improvement in their diet and drink choices. -  Suggestion is made for the patient to avoid simple carbohydrates from their diet including Cakes, Sweet Desserts / Pastries, Ice Cream, Soda (diet and regular), Sweet Tea, Candies, Chips, Cookies, Sweet Pastries, Store Bought Juices, Alcohol in Excess of  1-2 drinks a day, Artificial Sweeteners, Coffee Creamer, and "Sugar-free" Products. This will help patient to have stable blood glucose profile and potentially avoid unintended weight gain.   - I encouraged the patient to switch to unprocessed or minimally processed complex starch and increased protein intake (animal or plant source), fruits, and vegetables.   - Patient is advised to stick to a routine mealtimes to eat 3 meals a day and avoid unnecessary snacks (to snack only to correct hypoglycemia).  - I have approached her with the following individualized plan to manage  her diabetes and patient agrees:   -She is advised to lower her dose of Tresiba to 15 units SQ nightly and lower her Humalog to 6-12 units if glucose is above 90 and she is eating (Specific instructions on how to titrate insulin dosage based on glucose readings given to patient in writing). She is advised to stop the Metformin altogether today given her worsening diarrhea.   -she is encouraged to continue monitoring blood glucose 4 times daily (using her CGM), before meals and before bed, and to call the clinic if she has readings less than 70 or greater than 300 for 3 tests in a row.  - she is warned not to take insulin without proper monitoring per orders. - Adjustment parameters are given to her for hypo and hyperglycemia in writing.  -She is not a candidate for incretin therapy due to body habitus.  - Specific targets for  A1c;  LDL, HDL,  and Triglycerides were discussed with the patient.  2) Blood Pressure /Hypertension: Her BP is controlled to target.   she is advised to continue her current medications including Valsartan 80 mg p.o. daily with breakfast.  3)  Weight/Diet:  her Body mass index is 19.36 kg/m.  -  she is not a candidate for weight loss.  Exercise, and detailed carbohydrates information provided  -  detailed on discharge instructions.  4) Hypothyroidism- unspecified Her previsit TFTs are consistent  with appropriate hormone replacement (only TSH drawn and it was slightly elevated). She is advised to continue her current dose of Levothyroxine at 125 mcg po daily before breakfast (above her weight based max dose of 94 mcg). Likely has absorption issue.  Will recheck more comprehensive thyroid labs prior to next visit.   - The correct intake of thyroid hormone (Levothyroxine, Synthroid), is on empty stomach first thing in the morning, with water, separated by at least 30 minutes from breakfast and other medications,  and separated by more than 4 hours from calcium, iron, multivitamins, acid reflux medications (PPIs).  - This medication is a life-long medication and will be needed to correct thyroid hormone imbalances for the rest of your life.  The dose may change from time to time, based on thyroid blood work.  - It is extremely important to be consistent taking this medication, near the same time each morning.  -AVOID TAKING PRODUCTS CONTAINING BIOTIN (commonly found in Hair, Skin, Nails vitamins) AS IT INTERFERES WITH THE VALIDITY OF  THYROID FUNCTION BLOOD TESTS.   5) Chronic Care/Health Maintenance: -she is on ACEI/ARB and is encouraged to initiate and continue to follow up with Ophthalmology, Dentist, Podiatrist at least yearly or according to recommendations, and advised to stay away from smoking. I have recommended yearly flu vaccine and pneumonia vaccine at least every 5 years; moderate intensity exercise for up to 150 minutes weekly; and sleep for at least 7 hours a day.  - she is advised to maintain close follow up with [name], MD for primary care needs, as well as her other providers for optimal and coordinated care.     I spent  35  minutes in the care of the patient today including review of labs from CMP, Lipids, Thyroid Function, Hematology (current and previous including abstractions from other facilities); face-to-face time discussing  her blood glucose readings/logs,  discussing hypoglycemia and hyperglycemia episodes and symptoms, medications doses, her options of short and long term treatment based on the latest standards of care / guidelines;  discussion about incorporating lifestyle medicine;  and documenting the encounter. Risk reduction counseling performed per USPSTF guidelines to reduce obesity and cardiovascular risk factors.     Please refer to Patient Instructions for Blood Glucose Monitoring and Insulin/Medications Dosing Guide"  in media tab for additional information. Please  also refer to " Patient Self Inventory" in the Media  tab for reviewed elements of pertinent patient history.  Chauncey Fischer participated in the discussions, expressed understanding, and voiced agreement with the above plans.  All questions were answered to her satisfaction. she is encouraged to contact clinic should she have any questions or concerns prior to her return visit.   Follow up plan: - Return in about 3 months (around 01/23/2024) for Diabetes F/U with A1c in office, Thyroid follow up, Previsit labs, Bring meter and logs.  Ronny Bacon, [facility] [address] [place], [place] [address] Phone: [phone] Fax: [phone]  10/23/2023, 3:16 PM

## 2023-10-30 DIAGNOSIS — H903 Sensorineural hearing loss, bilateral: Secondary | ICD-10-CM | POA: Diagnosis not present

## 2023-11-05 DIAGNOSIS — M25561 Pain in right knee: Secondary | ICD-10-CM | POA: Diagnosis not present

## 2023-11-05 DIAGNOSIS — M6281 Muscle weakness (generalized): Secondary | ICD-10-CM | POA: Diagnosis not present

## 2023-11-05 DIAGNOSIS — M25569 Pain in unspecified knee: Secondary | ICD-10-CM | POA: Diagnosis not present

## 2023-11-06 DIAGNOSIS — M25561 Pain in right knee: Secondary | ICD-10-CM | POA: Diagnosis not present

## 2023-11-06 DIAGNOSIS — M62551 Muscle wasting and atrophy, not elsewhere classified, right thigh: Secondary | ICD-10-CM | POA: Diagnosis not present

## 2023-11-13 DIAGNOSIS — M25569 Pain in unspecified knee: Secondary | ICD-10-CM | POA: Diagnosis not present

## 2023-11-13 DIAGNOSIS — M6281 Muscle weakness (generalized): Secondary | ICD-10-CM | POA: Diagnosis not present

## 2023-11-13 DIAGNOSIS — M25561 Pain in right knee: Secondary | ICD-10-CM | POA: Diagnosis not present

## 2023-11-14 DIAGNOSIS — Z0001 Encounter for general adult medical examination with abnormal findings: Secondary | ICD-10-CM | POA: Diagnosis not present

## 2023-11-14 DIAGNOSIS — E1169 Type 2 diabetes mellitus with other specified complication: Secondary | ICD-10-CM | POA: Diagnosis not present

## 2023-11-14 DIAGNOSIS — Z6820 Body mass index (BMI) 20.0-20.9, adult: Secondary | ICD-10-CM | POA: Diagnosis not present

## 2023-11-14 DIAGNOSIS — R030 Elevated blood-pressure reading, without diagnosis of hypertension: Secondary | ICD-10-CM | POA: Diagnosis not present

## 2023-11-14 DIAGNOSIS — F17210 Nicotine dependence, cigarettes, uncomplicated: Secondary | ICD-10-CM | POA: Diagnosis not present

## 2023-11-14 DIAGNOSIS — E039 Hypothyroidism, unspecified: Secondary | ICD-10-CM | POA: Diagnosis not present

## 2023-11-14 DIAGNOSIS — L729 Follicular cyst of the skin and subcutaneous tissue, unspecified: Secondary | ICD-10-CM | POA: Diagnosis not present

## 2023-11-14 DIAGNOSIS — L089 Local infection of the skin and subcutaneous tissue, unspecified: Secondary | ICD-10-CM | POA: Diagnosis not present

## 2023-11-18 DIAGNOSIS — E039 Hypothyroidism, unspecified: Secondary | ICD-10-CM | POA: Diagnosis not present

## 2023-11-18 DIAGNOSIS — Z681 Body mass index (BMI) 19 or less, adult: Secondary | ICD-10-CM | POA: Diagnosis not present

## 2023-11-18 DIAGNOSIS — F17210 Nicotine dependence, cigarettes, uncomplicated: Secondary | ICD-10-CM | POA: Diagnosis not present

## 2023-11-18 DIAGNOSIS — L089 Local infection of the skin and subcutaneous tissue, unspecified: Secondary | ICD-10-CM | POA: Diagnosis not present

## 2023-11-18 DIAGNOSIS — R030 Elevated blood-pressure reading, without diagnosis of hypertension: Secondary | ICD-10-CM | POA: Diagnosis not present

## 2023-11-18 DIAGNOSIS — L729 Follicular cyst of the skin and subcutaneous tissue, unspecified: Secondary | ICD-10-CM | POA: Diagnosis not present

## 2023-11-18 DIAGNOSIS — E1169 Type 2 diabetes mellitus with other specified complication: Secondary | ICD-10-CM | POA: Diagnosis not present

## 2023-11-19 DIAGNOSIS — M25561 Pain in right knee: Secondary | ICD-10-CM | POA: Diagnosis not present

## 2023-11-19 DIAGNOSIS — M6281 Muscle weakness (generalized): Secondary | ICD-10-CM | POA: Diagnosis not present

## 2023-11-19 DIAGNOSIS — M25569 Pain in unspecified knee: Secondary | ICD-10-CM | POA: Diagnosis not present

## 2023-11-21 ENCOUNTER — Telehealth: Payer: Self-pay | Admitting: Nurse Practitioner

## 2023-11-21 DIAGNOSIS — M25561 Pain in right knee: Secondary | ICD-10-CM | POA: Diagnosis not present

## 2023-11-21 DIAGNOSIS — M6281 Muscle weakness (generalized): Secondary | ICD-10-CM | POA: Diagnosis not present

## 2023-11-21 DIAGNOSIS — M25569 Pain in unspecified knee: Secondary | ICD-10-CM | POA: Diagnosis not present

## 2023-11-21 NOTE — Telephone Encounter (Signed)
Have her increase Tresiba to 18 units and increase Humalog to 8-14 units TID with meals.  Maybe this will help bring down her levels while she works on the infection.

## 2023-11-21 NOTE — Telephone Encounter (Signed)
Pt called with high BG readings.   Date Before breakfast Before lunch Before supper Bedtime  11/19/23 327 injected 9 units High injected 12 units High injected 12 units High 15 units of Tresiba  11/20/2023 147 injected 7 units 305 inject 10 units 287 injected 9 units High 15 ubits of Tresiba  11/21/23 76  no insulin 276 injected 9 units            Pt taking: Humalog 7-12 units per sliding scales , Tresiba 15 units at bedtime. Patient has not been rotating her injection sites, she advised to do that. She is sticking to her sliding scale, she has not noted any insulin leaking after her injection. Patient was put on antibiotic this Monday.

## 2023-11-21 NOTE — Telephone Encounter (Signed)
Can you get me a few more days worth of readings to look at?  May need to adjust medications a bit more.

## 2023-11-21 NOTE — Telephone Encounter (Signed)
I called her sister, [name]. I gave her [name]'s recommendations.

## 2023-11-21 NOTE — Telephone Encounter (Signed)
Attempted to call, patient voicemail not set up,will attempt to call again.

## 2023-11-21 NOTE — Telephone Encounter (Signed)
Patient's sister Gabrielle Wood called and said that the last few days her sugar has been over 300 when she wakes up. This morning, Gabrielle Wood said that her sugar was 76 , she had breakfast then it was 266, she took 8 units

## 2023-11-25 DIAGNOSIS — M25561 Pain in right knee: Secondary | ICD-10-CM | POA: Diagnosis not present

## 2023-11-25 DIAGNOSIS — M25569 Pain in unspecified knee: Secondary | ICD-10-CM | POA: Diagnosis not present

## 2023-11-25 DIAGNOSIS — M6281 Muscle weakness (generalized): Secondary | ICD-10-CM | POA: Diagnosis not present

## 2023-11-28 DIAGNOSIS — M25561 Pain in right knee: Secondary | ICD-10-CM | POA: Diagnosis not present

## 2023-11-28 DIAGNOSIS — M25569 Pain in unspecified knee: Secondary | ICD-10-CM | POA: Diagnosis not present

## 2023-11-28 DIAGNOSIS — M6281 Muscle weakness (generalized): Secondary | ICD-10-CM | POA: Diagnosis not present

## 2023-12-03 DIAGNOSIS — M25569 Pain in unspecified knee: Secondary | ICD-10-CM | POA: Diagnosis not present

## 2023-12-03 DIAGNOSIS — M6281 Muscle weakness (generalized): Secondary | ICD-10-CM | POA: Diagnosis not present

## 2023-12-03 DIAGNOSIS — M25561 Pain in right knee: Secondary | ICD-10-CM | POA: Diagnosis not present

## 2023-12-05 DIAGNOSIS — M25569 Pain in unspecified knee: Secondary | ICD-10-CM | POA: Diagnosis not present

## 2023-12-05 DIAGNOSIS — M25561 Pain in right knee: Secondary | ICD-10-CM | POA: Diagnosis not present

## 2023-12-05 DIAGNOSIS — M6281 Muscle weakness (generalized): Secondary | ICD-10-CM | POA: Diagnosis not present

## 2023-12-12 ENCOUNTER — Encounter: Payer: Self-pay | Admitting: Adult Health

## 2023-12-12 DIAGNOSIS — F17210 Nicotine dependence, cigarettes, uncomplicated: Secondary | ICD-10-CM | POA: Diagnosis not present

## 2023-12-12 DIAGNOSIS — H00019 Hordeolum externum unspecified eye, unspecified eyelid: Secondary | ICD-10-CM | POA: Diagnosis not present

## 2023-12-12 DIAGNOSIS — Z6820 Body mass index (BMI) 20.0-20.9, adult: Secondary | ICD-10-CM | POA: Diagnosis not present

## 2023-12-12 NOTE — Progress Notes (Signed)
 This encounter was created in error - please disregard.

## 2023-12-17 DIAGNOSIS — J449 Chronic obstructive pulmonary disease, unspecified: Secondary | ICD-10-CM | POA: Diagnosis not present

## 2023-12-17 DIAGNOSIS — Z7984 Long term (current) use of oral hypoglycemic drugs: Secondary | ICD-10-CM | POA: Diagnosis not present

## 2023-12-17 DIAGNOSIS — I6782 Cerebral ischemia: Secondary | ICD-10-CM | POA: Diagnosis not present

## 2023-12-17 DIAGNOSIS — Z20822 Contact with and (suspected) exposure to covid-19: Secondary | ICD-10-CM | POA: Diagnosis not present

## 2023-12-17 DIAGNOSIS — E079 Disorder of thyroid, unspecified: Secondary | ICD-10-CM | POA: Diagnosis not present

## 2023-12-17 DIAGNOSIS — I10 Essential (primary) hypertension: Secondary | ICD-10-CM | POA: Diagnosis not present

## 2023-12-17 DIAGNOSIS — Z96651 Presence of right artificial knee joint: Secondary | ICD-10-CM | POA: Diagnosis not present

## 2023-12-17 DIAGNOSIS — N3000 Acute cystitis without hematuria: Secondary | ICD-10-CM | POA: Diagnosis not present

## 2023-12-17 DIAGNOSIS — M160 Bilateral primary osteoarthritis of hip: Secondary | ICD-10-CM | POA: Diagnosis not present

## 2023-12-17 DIAGNOSIS — R531 Weakness: Secondary | ICD-10-CM | POA: Diagnosis not present

## 2023-12-17 DIAGNOSIS — E119 Type 2 diabetes mellitus without complications: Secondary | ICD-10-CM | POA: Diagnosis not present

## 2023-12-17 DIAGNOSIS — Z79890 Hormone replacement therapy (postmenopausal): Secondary | ICD-10-CM | POA: Diagnosis not present

## 2023-12-17 DIAGNOSIS — Z87891 Personal history of nicotine dependence: Secondary | ICD-10-CM | POA: Diagnosis not present

## 2023-12-17 DIAGNOSIS — Z7982 Long term (current) use of aspirin: Secondary | ICD-10-CM | POA: Diagnosis not present

## 2023-12-17 DIAGNOSIS — Z043 Encounter for examination and observation following other accident: Secondary | ICD-10-CM | POA: Diagnosis not present

## 2023-12-17 DIAGNOSIS — W07XXXA Fall from chair, initial encounter: Secondary | ICD-10-CM | POA: Diagnosis not present

## 2023-12-17 DIAGNOSIS — Z79899 Other long term (current) drug therapy: Secondary | ICD-10-CM | POA: Diagnosis not present

## 2023-12-17 DIAGNOSIS — Z888 Allergy status to other drugs, medicaments and biological substances status: Secondary | ICD-10-CM | POA: Diagnosis not present

## 2023-12-17 DIAGNOSIS — I2510 Atherosclerotic heart disease of native coronary artery without angina pectoris: Secondary | ICD-10-CM | POA: Diagnosis not present

## 2023-12-17 DIAGNOSIS — M25551 Pain in right hip: Secondary | ICD-10-CM | POA: Diagnosis not present

## 2023-12-17 DIAGNOSIS — E785 Hyperlipidemia, unspecified: Secondary | ICD-10-CM | POA: Diagnosis not present

## 2023-12-30 DIAGNOSIS — M25561 Pain in right knee: Secondary | ICD-10-CM | POA: Diagnosis not present

## 2023-12-30 DIAGNOSIS — M25551 Pain in right hip: Secondary | ICD-10-CM | POA: Diagnosis not present

## 2023-12-30 DIAGNOSIS — M6281 Muscle weakness (generalized): Secondary | ICD-10-CM | POA: Diagnosis not present

## 2023-12-30 DIAGNOSIS — M25569 Pain in unspecified knee: Secondary | ICD-10-CM | POA: Diagnosis not present

## 2023-12-31 ENCOUNTER — Telehealth: Payer: Self-pay | Admitting: Nurse Practitioner

## 2023-12-31 NOTE — Telephone Encounter (Signed)
Patient's sister connie called and is bringing her reader by, she has high readings and can not pull it up on reader

## 2023-12-31 NOTE — Telephone Encounter (Signed)
Noted

## 2024-01-02 DIAGNOSIS — M25569 Pain in unspecified knee: Secondary | ICD-10-CM | POA: Diagnosis not present

## 2024-01-02 DIAGNOSIS — M25551 Pain in right hip: Secondary | ICD-10-CM | POA: Diagnosis not present

## 2024-01-02 DIAGNOSIS — M25561 Pain in right knee: Secondary | ICD-10-CM | POA: Diagnosis not present

## 2024-01-02 DIAGNOSIS — M6281 Muscle weakness (generalized): Secondary | ICD-10-CM | POA: Diagnosis not present

## 2024-01-06 DIAGNOSIS — Z800 Family history of malignant neoplasm of digestive organs: Secondary | ICD-10-CM | POA: Diagnosis not present

## 2024-01-06 DIAGNOSIS — K449 Diaphragmatic hernia without obstruction or gangrene: Secondary | ICD-10-CM | POA: Diagnosis not present

## 2024-01-06 DIAGNOSIS — I2510 Atherosclerotic heart disease of native coronary artery without angina pectoris: Secondary | ICD-10-CM | POA: Diagnosis not present

## 2024-01-06 DIAGNOSIS — R109 Unspecified abdominal pain: Secondary | ICD-10-CM | POA: Diagnosis not present

## 2024-01-06 DIAGNOSIS — I119 Hypertensive heart disease without heart failure: Secondary | ICD-10-CM | POA: Diagnosis not present

## 2024-01-06 DIAGNOSIS — R1032 Left lower quadrant pain: Secondary | ICD-10-CM | POA: Diagnosis not present

## 2024-01-06 DIAGNOSIS — R110 Nausea: Secondary | ICD-10-CM | POA: Diagnosis not present

## 2024-01-06 DIAGNOSIS — K5900 Constipation, unspecified: Secondary | ICD-10-CM | POA: Diagnosis not present

## 2024-01-06 DIAGNOSIS — K5730 Diverticulosis of large intestine without perforation or abscess without bleeding: Secondary | ICD-10-CM | POA: Diagnosis not present

## 2024-01-06 DIAGNOSIS — E1165 Type 2 diabetes mellitus with hyperglycemia: Secondary | ICD-10-CM | POA: Diagnosis not present

## 2024-01-06 DIAGNOSIS — Z87891 Personal history of nicotine dependence: Secondary | ICD-10-CM | POA: Diagnosis not present

## 2024-01-06 DIAGNOSIS — M859 Disorder of bone density and structure, unspecified: Secondary | ICD-10-CM | POA: Diagnosis not present

## 2024-01-06 DIAGNOSIS — I700 Atherosclerosis of aorta: Secondary | ICD-10-CM | POA: Diagnosis not present

## 2024-01-06 DIAGNOSIS — E860 Dehydration: Secondary | ICD-10-CM | POA: Diagnosis not present

## 2024-01-06 DIAGNOSIS — R079 Chest pain, unspecified: Secondary | ICD-10-CM | POA: Diagnosis not present

## 2024-01-06 DIAGNOSIS — N281 Cyst of kidney, acquired: Secondary | ICD-10-CM | POA: Diagnosis not present

## 2024-01-10 DIAGNOSIS — M25569 Pain in unspecified knee: Secondary | ICD-10-CM | POA: Diagnosis not present

## 2024-01-10 DIAGNOSIS — M25551 Pain in right hip: Secondary | ICD-10-CM | POA: Diagnosis not present

## 2024-01-10 DIAGNOSIS — M6281 Muscle weakness (generalized): Secondary | ICD-10-CM | POA: Diagnosis not present

## 2024-01-10 DIAGNOSIS — M25561 Pain in right knee: Secondary | ICD-10-CM | POA: Diagnosis not present

## 2024-01-16 DIAGNOSIS — M1990 Unspecified osteoarthritis, unspecified site: Secondary | ICD-10-CM | POA: Diagnosis not present

## 2024-01-16 DIAGNOSIS — I10 Essential (primary) hypertension: Secondary | ICD-10-CM | POA: Diagnosis not present

## 2024-01-16 DIAGNOSIS — M79604 Pain in right leg: Secondary | ICD-10-CM | POA: Diagnosis not present

## 2024-01-16 DIAGNOSIS — I2510 Atherosclerotic heart disease of native coronary artery without angina pectoris: Secondary | ICD-10-CM | POA: Diagnosis not present

## 2024-01-16 DIAGNOSIS — E785 Hyperlipidemia, unspecified: Secondary | ICD-10-CM | POA: Diagnosis not present

## 2024-01-16 DIAGNOSIS — G8929 Other chronic pain: Secondary | ICD-10-CM | POA: Diagnosis not present

## 2024-01-20 ENCOUNTER — Encounter: Payer: Self-pay | Admitting: Adult Health

## 2024-01-20 DIAGNOSIS — M6281 Muscle weakness (generalized): Secondary | ICD-10-CM | POA: Diagnosis not present

## 2024-01-20 DIAGNOSIS — M25569 Pain in unspecified knee: Secondary | ICD-10-CM | POA: Diagnosis not present

## 2024-01-20 DIAGNOSIS — E782 Mixed hyperlipidemia: Secondary | ICD-10-CM | POA: Diagnosis not present

## 2024-01-20 DIAGNOSIS — D649 Anemia, unspecified: Secondary | ICD-10-CM | POA: Diagnosis not present

## 2024-01-20 DIAGNOSIS — M25551 Pain in right hip: Secondary | ICD-10-CM | POA: Diagnosis not present

## 2024-01-20 DIAGNOSIS — Z6821 Body mass index (BMI) 21.0-21.9, adult: Secondary | ICD-10-CM | POA: Diagnosis not present

## 2024-01-20 DIAGNOSIS — M25561 Pain in right knee: Secondary | ICD-10-CM | POA: Diagnosis not present

## 2024-01-20 DIAGNOSIS — E119 Type 2 diabetes mellitus without complications: Secondary | ICD-10-CM | POA: Diagnosis not present

## 2024-01-20 DIAGNOSIS — I10 Essential (primary) hypertension: Secondary | ICD-10-CM | POA: Diagnosis not present

## 2024-01-20 DIAGNOSIS — E039 Hypothyroidism, unspecified: Secondary | ICD-10-CM | POA: Diagnosis not present

## 2024-01-20 DIAGNOSIS — E7849 Other hyperlipidemia: Secondary | ICD-10-CM | POA: Diagnosis not present

## 2024-01-20 DIAGNOSIS — Z862 Personal history of diseases of the blood and blood-forming organs and certain disorders involving the immune mechanism: Secondary | ICD-10-CM | POA: Diagnosis not present

## 2024-01-21 LAB — LAB REPORT - SCANNED
A1c: 10.5
EGFR: 66

## 2024-01-21 NOTE — Progress Notes (Signed)
 This encounter was created in error - please disregard.

## 2024-01-22 ENCOUNTER — Encounter: Payer: Self-pay | Admitting: Orthopedic Surgery

## 2024-01-22 ENCOUNTER — Ambulatory Visit: Payer: PPO | Admitting: Orthopedic Surgery

## 2024-01-22 VITALS — BP 102/62 | HR 68 | Ht 67.0 in | Wt 140.0 lb

## 2024-01-22 DIAGNOSIS — M5410 Radiculopathy, site unspecified: Secondary | ICD-10-CM | POA: Diagnosis not present

## 2024-01-22 NOTE — Progress Notes (Signed)
Office Visit Note   Patient: Gabrielle Wood           Date of Birth: 1946-08-19           MRN: [id number] Visit Date: 01/22/2024 Requested by: [name], MD [address] [place],  [place] [address] PCP: [name], MD   Assessment & Plan:  78 year old female right total knee arthroplasty performed many years ago also had back surgery many years ago comes in with initial complaint of knee pain but further questioning and examination reveals that she is having radicular pain in her right leg  She is advised to see neurosurgery again for management and evaluation  Her knee replacement is functioning well and there is no ligament issue with the knee  Encounter Diagnosis  Name Primary?   Radicular leg pain Yes    No orders of the defined types were placed in this encounter.    Subjective: Chief Complaint  Patient presents with   Knee Pain    Right / Dr Rexene Edison has the CD     HPI: 78 year old female presents with knee pain.  Patient had knee replacement about 15 years ago.  Patient started having right leg pain within the last year.  She saw her doctor who put the knee in it was deemed to be fine.  Her therapist thought there might have been some loosening in the ligaments in the knee.  She went to physical therapy her strength got better but she still having "knee pain".  However, the pain actually runs from her knee down to her ankle and then up into her right hip area              ROS: No fever no chills no erythema around the knee itself   Images personally read and my interpretation : The x-rays of the knee replacement are on a disk it appears she has a metal patella but there is no loosening and the alignment is perfect   Visit Diagnoses:  1. Radicular leg pain      Follow-Up Instructions: Return for NO FU SCHEDULED.    Objective: Vital Signs: BP 102/62   Pulse 68   Ht 5\' 7"  (1.702 m)   Wt 140 lb (63.5 kg)   BMI 21.93 kg/m   Physical  Exam Vitals and nursing note reviewed.  Constitutional:      Appearance: Normal appearance.  HENT:     Head: Normocephalic and atraumatic.  Eyes:     General: No scleral icterus.       Right eye: No discharge.        Left eye: No discharge.     Extraocular Movements: Extraocular movements intact.     Conjunctiva/sclera: Conjunctivae normal.     Pupils: Pupils are equal, round, and reactive to light.  Cardiovascular:     Rate and Rhythm: Normal rate.     Pulses: Normal pulses.  Skin:    General: Skin is warm and dry.     Capillary Refill: Capillary refill takes less than 2 seconds.  Neurological:     General: No focal deficit present.     Mental Status: She is alert and oriented to person, place, and time.  Psychiatric:        Mood and Affect: Mood normal.        Behavior: Behavior normal.        Thought Content: Thought content normal.        Judgment: Judgment normal.  Ortho Exam Excellent range of motion in the right knee well-healed midline incision no erythema no effusion no warmth of the joint flexion arc is at least 120 degrees knee is stable.  The side-to-side minimal opening is equal on each side and is perfect for knee replacement as we never lock these down with no medial lateral laxity.  The key is that it is equal and is not excessive.  Straight leg raise was positive on the right  Back was tender right lower back was tender midline back was tender  Specialty Comments:  No specialty comments available.  Imaging: No results found.   PMFS History: Patient Active Problem List   Diagnosis Date Noted   Anxiety 05/28/2023   Vitamin D deficiency 01/31/2022   Pressure injury of skin 10/16/2021   DKA (diabetic ketoacidosis) (HCC) 10/14/2021   Hypomagnesemia 10/14/2021   Hypoalbuminemia 10/14/2021   Community acquired pneumonia 10/14/2021   Renal cyst 10/02/2021   History of colonic polyps 08/27/2018   Taking multiple medications for chronic disease  08/27/2018   Type 2 diabetes mellitus with hypoglycemia without coma, with long-term current use of insulin (HCC) 09/14/2015   Tobacco abuse counseling 09/14/2015   Rheumatoid arthritis (HCC) 06/08/2015   Diabetes mellitus (HCC) 06/08/2015   Gastritis and duodenitis 04/05/2012   Pancreatitis, acute 04/03/2012   Mixed hyperlipidemia 04/03/2012   Benign hypertension 04/03/2012   Hypothyroidism 04/03/2012   GERD (gastroesophageal reflux disease) 04/03/2012   Past Medical History:  Diagnosis Date   Arthritis    COPD (chronic obstructive pulmonary disease) (HCC)    Diabetes mellitus    DVT (deep venous thrombosis) (HCC)    years ago   GERD (gastroesophageal reflux disease)    H. pylori infection 2013   S/p treatment with amoxicillin, Biaxin, PPI   High cholesterol    Hypertension    Hypothyroidism    NSTEMI (non-ST elevated myocardial infarction) (HCC) 12/2020   s/p  PCI DES to the LCx   Thyroid disease     Family History  Problem Relation Age of Onset   Pancreatic cancer Mother        diagnosed at age 75s, died from heart attack   Inflammatory bowel disease Brother        crohn's, his dgt deceased from colon cancer at age 84   Colon cancer Neg Hx     Past Surgical History:  Procedure Laterality Date   BACK SURGERY     lumbar x3    ESOPHAGOGASTRODUODENOSCOPY  04/04/2012   Moderate gastritis/MODERATE Duodenitis in the duodenal bulb duodenum.  Pathology with chronic H. pylori gastritis.   EUS  05/14/2012   Normal pancreas, gallbladder,biliary tree/ Incidental finding of simple-appearing right renal cyst.   TONSILLECTOMY     TUBAL LIGATION     VEIN SURGERY     stripping years ago but recent laser surgery; bilateral legs   Social History   Occupational History   Occupation: retired, Advertising copywriter at [employer]  Tobacco Use   Smoking status: Former    Current packs/day: 0.50    Types: Cigarettes   Smokeless tobacco: Never  Vaping Use   Vaping status: Never Used  Substance  and Sexual Activity   Alcohol use: No   Drug use: No   Sexual activity: Not on file

## 2024-01-22 NOTE — Addendum Note (Signed)
Addended by: Fuller Canada E on: 01/22/2024 11:53 AM   Modules accepted: Level of Service

## 2024-01-22 NOTE — Progress Notes (Signed)
  Intake history:  BP 102/62   Pulse 68   Ht 5\' 7"  (1.702 m)   Wt 140 lb (63.5 kg)   BMI 21.93 kg/m  Body mass index is 21.93 kg/m.    WHAT ARE WE SEEING YOU FOR TODAY?   right knee(s)  How long has this bothered you? (DOI?DOS?WS?)  Long duration   Anticoag.  Yes  Diabetes Yes  Heart disease Yes has stent on Brillinta   Hypertension Yes  SMOKING HX No  Kidney disease No  Any ALLERGIES ______________________________________________   Treatment:  Have you taken:  Tylenol Yes  Advil No  Had PT Yes/ feels stronger after PT but still has pain   Had injection No  Other  _________________has knee replacement ________

## 2024-01-28 ENCOUNTER — Encounter: Payer: Self-pay | Admitting: Internal Medicine

## 2024-01-29 ENCOUNTER — Encounter: Payer: Self-pay | Admitting: Nurse Practitioner

## 2024-01-29 ENCOUNTER — Ambulatory Visit (INDEPENDENT_AMBULATORY_CARE_PROVIDER_SITE_OTHER): Payer: PPO | Admitting: Nurse Practitioner

## 2024-01-29 VITALS — BP 104/62 | HR 61 | Ht 67.0 in | Wt 140.2 lb

## 2024-01-29 DIAGNOSIS — E559 Vitamin D deficiency, unspecified: Secondary | ICD-10-CM

## 2024-01-29 DIAGNOSIS — E039 Hypothyroidism, unspecified: Secondary | ICD-10-CM | POA: Diagnosis not present

## 2024-01-29 DIAGNOSIS — Z7984 Long term (current) use of oral hypoglycemic drugs: Secondary | ICD-10-CM

## 2024-01-29 DIAGNOSIS — Z794 Long term (current) use of insulin: Secondary | ICD-10-CM

## 2024-01-29 DIAGNOSIS — E11649 Type 2 diabetes mellitus with hypoglycemia without coma: Secondary | ICD-10-CM

## 2024-01-29 NOTE — Progress Notes (Unsigned)
 Endocrinology Follow Up Note       01/30/2024, 7:30 AM   Subjective:    Patient ID: Gabrielle Wood, female    DOB: [birth year].  Gabrielle Wood is being seen in follow up after being seen in consultation for management of currently uncontrolled symptomatic diabetes requested by  [name], MD.   Past Medical History:  Diagnosis Date   Arthritis    COPD (chronic obstructive pulmonary disease) (HCC)    Diabetes mellitus    DVT (deep venous thrombosis) (HCC)    years ago   GERD (gastroesophageal reflux disease)    H. pylori infection 2013   S/p treatment with amoxicillin, Biaxin, PPI   High cholesterol    Hypertension    Hypothyroidism    NSTEMI (non-ST elevated myocardial infarction) (HCC) 12/2020   s/p  PCI DES to the LCx   Thyroid disease     Past Surgical History:  Procedure Laterality Date   BACK SURGERY     lumbar x3    ESOPHAGOGASTRODUODENOSCOPY  04/04/2012   Moderate gastritis/MODERATE Duodenitis in the duodenal bulb duodenum.  Pathology with chronic H. pylori gastritis.   EUS  05/14/2012   Normal pancreas, gallbladder,biliary tree/ Incidental finding of simple-appearing right renal cyst.   TONSILLECTOMY     TUBAL LIGATION     VEIN SURGERY     stripping years ago but recent laser surgery; bilateral legs    Social History   Socioeconomic History   Marital status: Widowed    Spouse name: Not on file   Number of children: 2   Years of education: Not on file   Highest education level: Not on file  Occupational History   Occupation: retired, Advertising copywriter at [employer]  Tobacco Use   Smoking status: Former    Current packs/day: 0.50    Types: Cigarettes   Smokeless tobacco: Never  Vaping Use   Vaping status: Never Used  Substance and Sexual Activity   Alcohol use: No   Drug use: No   Sexual activity: Not on file  Other Topics Concern   Not on file  Social History Narrative    Not on file   Social Drivers of Health   Financial Resource Strain: Low Risk  (11/05/2022)   Received from [facility], [facility]   Overall Financial Resource Strain (CARDIA)    Difficulty of Paying Living Expenses: Not hard at all  Food Insecurity: No Food Insecurity (11/05/2022)   Received from [facility], [facility]   Hunger Vital Sign    Worried About Running Out of Food in the Last Year: Never true    Ran Out of Food in the Last Year: Never true  Transportation Needs: No Transportation Needs (11/05/2022)   Received from [facility], [facility]   [facility] - Transportation    Lack of Transportation (Medical): No    Lack of Transportation (Non-Medical): No  Physical Activity: Not on file  Stress: Not on file  Social Connections: Unknown (04/15/2022)   Received from [facility], [facility]   Social Network    Social Network: Not on file    Family History  Problem Relation  Age of Onset   Pancreatic cancer Mother        diagnosed at age 49s, died from heart attack   Inflammatory bowel disease Brother        crohn's, his dgt deceased from colon cancer at age 71   Colon cancer Neg Hx     Outpatient Encounter Medications as of 01/29/2024  Medication Sig   albuterol (PROVENTIL) (2.5 MG/3ML) 0.083% nebulizer solution Take 3 mLs (2.5 mg total) by nebulization every 6 (six) hours as needed for wheezing or shortness of breath.   albuterol (VENTOLIN HFA) 108 (90 Base) MCG/ACT inhaler Inhale 1-2 puffs into the lungs every 4 (four) hours as needed for wheezing or shortness of breath.   ALPRAZolam (XANAX) 1 MG tablet Take 0.5-1 mg by mouth. 0.5 tablet in am and 0.5 tab at night   amLODipine (NORVASC) 5 MG tablet Take 5 mg by mouth daily.   aspirin EC 81 MG tablet Take 81 mg by mouth daily. Swallow whole.   atorvastatin (LIPITOR) 80 MG tablet Take 80 mg by mouth every evening.   Continuous Glucose Sensor (FREESTYLE LIBRE 3 SENSOR) MISC Change sensor every 14  days.   dicyclomine (BENTYL) 20 MG tablet Take 1 tablet (20 mg total) by mouth 2 (two) times daily.   gabapentin (NEURONTIN) 300 MG capsule Take 300 mg by mouth 3 (three) times daily.   insulin lispro (HUMALOG KWIKPEN) 100 UNIT/ML KwikPen Inject 8-14 Units into the skin 3 (three) times daily.   levocetirizine (XYZAL) 5 MG tablet Take 1 tablet (5 mg total) by mouth every evening.   levothyroxine (SYNTHROID) 125 MCG tablet TAKE ONE TABLET BY MOUTH DAILY (ONE HOUR BEFORE FOOD OR DRINK)   metoprolol succinate (TOPROL-XL) 50 MG 24 hr tablet Take 1 tablet (50 mg total) by mouth every evening. Take with or immediately following a meal.   nitroGLYCERIN (NITROSTAT) 0.4 MG SL tablet Place 0.4 mg under the tongue every 5 (five) minutes as needed for chest pain.   ondansetron (ZOFRAN-ODT) 4 MG disintegrating tablet Take 4 mg by mouth every 8 (eight) hours as needed.   pantoprazole (PROTONIX) 40 MG tablet Take 40 mg by mouth daily.   ticagrelor (BRILINTA) 90 MG TABS tablet Take 90 mg by mouth 2 (two) times daily.   TRESIBA FLEXTOUCH 100 UNIT/ML FlexTouch Pen Inject 20 Units into the skin at bedtime.   valsartan (DIOVAN) 80 MG tablet Take 40 mg by mouth daily.   vitamin C (ASCORBIC ACID) 500 MG tablet Take 500 mg by mouth daily.   [DISCONTINUED] fexofenadine (ALLEGRA) 60 MG tablet Take by mouth.   No facility-administered encounter medications on file as of 01/29/2024.    ALLERGIES: Allergies  Allergen Reactions   Amlodipine    Ciprocin-Fluocin-Procin [Fluocinolone]     Nightmares   Lipitor [Atorvastatin] Other (See Comments)    Muscle aches, pain   Nebivolol    Nitrofuran Derivatives    Lisinopril Cough   Red Yeast Rice [Cholestin] Itching and Rash    VACCINATION STATUS: Immunization History  Administered Date(s) Administered   Pneumococcal Polysaccharide-23 04/04/2012    Diabetes She presents for her follow-up diabetic visit. She has type 2 diabetes mellitus. Onset time: Was diagnosed at  approx age of 78. Her disease course has been fluctuating. There are no hypoglycemic associated symptoms. Associated symptoms include fatigue and foot paresthesias. Pertinent negatives for diabetes include no polydipsia, no polyuria and no weight loss. There are no hypoglycemic complications. Symptoms are improving. Diabetic complications include heart disease  and peripheral neuropathy. (Had a MI in the past, recently hospitalized for DKA) Risk factors for coronary artery disease include diabetes mellitus, dyslipidemia, hypertension, post-menopausal and sedentary lifestyle. Current diabetic treatment includes oral agent (monotherapy) and intensive insulin program. She is compliant with treatment most of the time. Her weight is fluctuating minimally. She is following a generally healthy diet. When asked about meal planning, she reported none. She has not had a previous visit with a dietitian (Has appt with Boyd Kerbs after me today). She participates in exercise intermittently. Her overall blood glucose range is >200 mg/dl. (She presents today, accompanied by her sister Gabrielle Wood, with her CGM but the receiver is broken (will not turn on, will not charge).  Her sister notes she does have a back up meter at home she can use in the interim.  Her most recent A1c on 2/17 was 10.5%.  Her PCP did increase her [name] to help with hyperglycemia.  She denies any hypoglycemia.  ) An ACE inhibitor/angiotensin II receptor blocker is being taken. She does not see a podiatrist.Eye exam is current.  Hypertension This is a chronic problem. The current episode started more than 1 year ago. The problem has been resolved since onset. The problem is controlled. Agents associated with hypertension include thyroid hormones. Risk factors for coronary artery disease include diabetes mellitus, dyslipidemia, post-menopausal state and sedentary lifestyle. Past treatments include angiotensin blockers, calcium channel blockers and beta blockers. The  current treatment provides significant improvement. There are no compliance problems.  Hypertensive end-organ damage includes CAD/MI. Identifiable causes of hypertension include a thyroid problem.     Review of systems  Constitutional: + increasing body weight,  current Body mass index is 21.96 kg/m. , + fatigue- deconditioned, no subjective hyperthermia, no subjective hypothermia Eyes: no blurry vision, no xerophthalmia ENT: no sore throat, no nodules palpated in throat, no dysphagia/odynophagia, no hoarseness Cardiovascular: no chest pain, no shortness of breath, no palpitations, no leg swelling Respiratory: no cough, no shortness of breath Gastrointestinal: no nausea/vomiting, + intermittent diarrhea with incontinence Genitourinary: +polyuria with incontinence and recurrent UTIs Musculoskeletal: + generalized weakness-using walker Skin: no rashes, no hyperemia Neurological: no tremors, + numbness/tingling to BLE, no dizziness Psychiatric: no depression, no anxiety  Objective:     BP 104/62 (BP Location: Right Arm, Patient Position: Sitting, Cuff Size: Large)   Pulse 61   Ht 5\' 7"  (1.702 m)   Wt 140 lb 3.2 oz (63.6 kg)   BMI 21.96 kg/m   Wt Readings from Last 3 Encounters:  01/29/24 140 lb 3.2 oz (63.6 kg)  01/22/24 140 lb (63.5 kg)  10/23/23 123 lb 9.6 oz (56.1 kg)     BP Readings from Last 3 Encounters:  01/29/24 104/62  01/22/24 102/62  10/23/23 101/66      Physical Exam- Limited  Constitutional:  Body mass index is 21.96 kg/m. , not in acute distress, normal state of mind Eyes:  EOMI, no exophthalmos Neck: Supple Musculoskeletal: no gross deformities, strength intact in all four extremities, no gross restriction of joint movements, walks with cane/walker Skin:  no rashes, no hyperemia Neurological: no tremor with outstretched hands   Diabetic Foot Exam - Simple   No data filed      CMP ( most recent) CMP     Component Value Date/Time   NA 134 (L)  09/30/2023 1410   NA 139 04/25/2021 0000   K 3.8 09/30/2023 1410   CL 99 09/30/2023 1410   CO2 26 09/30/2023 1410   GLUCOSE  219 (H) 09/30/2023 1410   BUN 12 10/21/2023 0000   CREATININE 0.8 10/21/2023 0000   CREATININE 0.68 09/30/2023 1410   CREATININE 0.78 09/20/2016 0923   CALCIUM 9.8 09/30/2023 1410   PROT 7.1 07/05/2022 1100   PROT 6.7 07/17/2017 0912   ALBUMIN 4.0 07/05/2022 1100   ALBUMIN 4.4 07/17/2017 0912   AST 17 07/05/2022 1100   ALT 15 07/05/2022 1100   ALKPHOS 56 07/05/2022 1100   BILITOT 0.7 07/05/2022 1100   BILITOT 0.5 07/17/2017 0912   GFRNONAA >60 09/30/2023 1410   GFRNONAA 83 07/10/2023 1107   GFRNONAA 77 09/20/2016 0923   GFRAA >60 08/21/2019 1539   GFRAA 88 09/20/2016 0923     Diabetic Labs (most recent): Lab Results  Component Value Date   HGBA1C 10.7 10/21/2023   HGBA1C 9.8 (A) 07/23/2023   HGBA1C 9.4 (A) 04/09/2023   MICROALBUR 150 02/21/2021      Lab Results  Component Value Date   TSH 5.30 10/21/2023   TSH 1.68 01/30/2022   TSH 1.62 07/24/2021   TSH 11.00 (A) 04/25/2021   TSH 19.240 (H) 07/17/2017   TSH 1.91 09/20/2016   TSH 3.05 06/14/2016   TSH 2.86 03/12/2016   TSH 4.280 04/04/2012   FREET4 1.53 07/17/2017   FREET4 1.5 09/20/2016   FREET4 1.6 06/14/2016   FREET4 1.5 03/12/2016     Assessment & Plan:   1) Uncontrolled type 2 diabetes mellitus with circulatory disorder, with long-term current use of insulin (HCC)  - Gabrielle Wood has currently uncontrolled symptomatic type 2 DM since 78 years of age.  She presents today, accompanied by her sister Gabrielle Wood, with her CGM but the receiver is broken (will not turn on, will not charge).  Her sister notes she does have a back up meter at home she can use in the interim.  Her most recent A1c on 2/17 was 10.5%.  Her PCP did increase her [name] to help with hyperglycemia.  She denies any hypoglycemia.    -Recent labs reviewed.  - I had a long discussion with her about the  progressive nature of diabetes and the pathology behind its complications. 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 These are all discussed in detail with her.  - Nutritional counseling repeated at each appointment due to patients tendency to fall back in to old habits.  - The patient admits there is a room for improvement in their diet and drink choices. -  Suggestion is made for the patient to avoid simple carbohydrates from their diet including Cakes, Sweet Desserts / Pastries, Ice Cream, Soda (diet and regular), Sweet Tea, Candies, Chips, Cookies, Sweet Pastries, Store Bought Juices, Alcohol in Excess of 1-2 drinks a day, Artificial Sweeteners, Coffee Creamer, and "Sugar-free" Products. This will help patient to have stable blood glucose profile and potentially avoid unintended weight gain.   - I encouraged the patient to switch to unprocessed or minimally processed complex starch and increased protein intake (animal or plant source), fruits, and vegetables.   - Patient is advised to stick to a routine mealtimes to eat 3 meals a day and avoid unnecessary snacks (to snack only to correct hypoglycemia).  - I have approached her with the following individualized plan to manage  her diabetes and patient agrees:   -I cannot safely recommend dosage changes today given the lack  of logs since her CGM receiver is broken.  She is advised to continue her dose of Tresiba 20 units SQ nightly and Humalog 8-14 units if glucose is above 90 and she is eating (Specific instructions on how to titrate insulin dosage based on glucose readings given to patient in writing).   I asked they send readings in 2 weeks to review so we can make further adjustments.  -she is encouraged to continue monitoring blood glucose 4 times daily (using her CGM), before meals and before  bed, and to call the clinic if she has readings less than 70 or greater than 300 for 3 tests in a row.  I encouraged them to reach out to [employer] about a replacement receiver.  - she is warned not to take insulin without proper monitoring per orders. - Adjustment parameters are given to her for hypo and hyperglycemia in writing.  -She is not a candidate for incretin therapy due to body habitus.  - Specific targets for  A1c;  LDL, HDL,  and Triglycerides were discussed with the patient.  2) Blood Pressure /Hypertension: Her BP is controlled to target.   she is advised to continue her current medications including Valsartan 80 mg p.o. daily with breakfast.  3)  Weight/Diet:  her Body mass index is 21.96 kg/m.  -  she is not a candidate for weight loss.  Exercise, and detailed carbohydrates information provided  -  detailed on discharge instructions.  4) Hypothyroidism- unspecified There are no recent TFTs to review. She is advised to continue her current dose of Levothyroxine at 125 mcg po daily before breakfast (above her weight based max dose of 94 mcg). Likely has absorption issue.  Will recheck more thyroid labs prior to next visit.   - The correct intake of thyroid hormone (Levothyroxine, Synthroid), is on empty stomach first thing in the morning, with water, separated by at least 30 minutes from breakfast and other medications,  and separated by more than 4 hours from calcium, iron, multivitamins, acid reflux medications (PPIs).  - This medication is a life-long medication and will be needed to correct thyroid hormone imbalances for the rest of your life.  The dose may change from time to time, based on thyroid blood work.  - It is extremely important to be consistent taking this medication, near the same time each morning.  -AVOID TAKING PRODUCTS CONTAINING BIOTIN (commonly found in Hair, Skin, Nails vitamins) AS IT INTERFERES WITH THE VALIDITY OF THYROID FUNCTION BLOOD TESTS.   5)  Chronic Care/Health Maintenance: -she is on ACEI/ARB and is encouraged to initiate and continue to follow up with Ophthalmology, Dentist, Podiatrist at least yearly or according to recommendations, and advised to stay away from smoking. I have recommended yearly flu vaccine and pneumonia vaccine at least every 5 years; moderate intensity exercise for up to 150 minutes weekly; and sleep for at least 7 hours a day.  - she is advised to maintain close follow up with [name], MD for primary care needs, as well as her other providers for optimal and coordinated care.     I spent  38  minutes in the care of the patient today including review of labs from CMP, Lipids, Thyroid Function, Hematology (current and previous including abstractions from other facilities); face-to-face time discussing  her blood glucose readings/logs, discussing hypoglycemia and hyperglycemia episodes and symptoms, medications doses, her options of short and long term treatment based on the latest standards of care / guidelines;  discussion about incorporating lifestyle medicine;  and documenting the encounter. Risk reduction counseling performed per USPSTF guidelines to reduce obesity and cardiovascular risk factors.     Please refer to Patient Instructions for Blood Glucose Monitoring and Insulin/Medications Dosing Guide"  in media tab for additional information. Please  also refer to " Patient Self Inventory" in the Media  tab for reviewed elements of pertinent patient history.  Chauncey Fischer participated in the discussions, expressed understanding, and voiced agreement with the above plans.  All questions were answered to her satisfaction. she is encouraged to contact clinic should she have any questions or concerns prior to her return visit.   Follow up plan: - Return in about 3 months (around 04/27/2024) for Diabetes F/U with A1c in office, Thyroid follow up, Previsit labs, Bring meter and logs.  Ronny Bacon, [facility] [address] [place], [place] [address] Phone: [phone] Fax: [phone]  01/30/2024, 7:30 AM

## 2024-01-31 DIAGNOSIS — E7849 Other hyperlipidemia: Secondary | ICD-10-CM | POA: Diagnosis not present

## 2024-01-31 DIAGNOSIS — Z6821 Body mass index (BMI) 21.0-21.9, adult: Secondary | ICD-10-CM | POA: Diagnosis not present

## 2024-01-31 DIAGNOSIS — R030 Elevated blood-pressure reading, without diagnosis of hypertension: Secondary | ICD-10-CM | POA: Diagnosis not present

## 2024-01-31 DIAGNOSIS — I10 Essential (primary) hypertension: Secondary | ICD-10-CM | POA: Diagnosis not present

## 2024-01-31 DIAGNOSIS — Z862 Personal history of diseases of the blood and blood-forming organs and certain disorders involving the immune mechanism: Secondary | ICD-10-CM | POA: Diagnosis not present

## 2024-01-31 DIAGNOSIS — E039 Hypothyroidism, unspecified: Secondary | ICD-10-CM | POA: Diagnosis not present

## 2024-02-20 DIAGNOSIS — D1809 Hemangioma of other sites: Secondary | ICD-10-CM | POA: Diagnosis not present

## 2024-02-20 DIAGNOSIS — M5416 Radiculopathy, lumbar region: Secondary | ICD-10-CM | POA: Diagnosis not present

## 2024-02-20 DIAGNOSIS — M4807 Spinal stenosis, lumbosacral region: Secondary | ICD-10-CM | POA: Diagnosis not present

## 2024-02-20 DIAGNOSIS — M48061 Spinal stenosis, lumbar region without neurogenic claudication: Secondary | ICD-10-CM | POA: Diagnosis not present

## 2024-02-29 DIAGNOSIS — Z79899 Other long term (current) drug therapy: Secondary | ICD-10-CM | POA: Diagnosis not present

## 2024-02-29 DIAGNOSIS — G9340 Encephalopathy, unspecified: Secondary | ICD-10-CM | POA: Diagnosis not present

## 2024-02-29 DIAGNOSIS — Z7951 Long term (current) use of inhaled steroids: Secondary | ICD-10-CM | POA: Diagnosis not present

## 2024-02-29 DIAGNOSIS — E119 Type 2 diabetes mellitus without complications: Secondary | ICD-10-CM | POA: Diagnosis not present

## 2024-02-29 DIAGNOSIS — E1142 Type 2 diabetes mellitus with diabetic polyneuropathy: Secondary | ICD-10-CM | POA: Diagnosis not present

## 2024-02-29 DIAGNOSIS — R069 Unspecified abnormalities of breathing: Secondary | ICD-10-CM | POA: Diagnosis not present

## 2024-02-29 DIAGNOSIS — Z79890 Hormone replacement therapy (postmenopausal): Secondary | ICD-10-CM | POA: Diagnosis not present

## 2024-02-29 DIAGNOSIS — G9341 Metabolic encephalopathy: Secondary | ICD-10-CM | POA: Diagnosis not present

## 2024-02-29 DIAGNOSIS — Z794 Long term (current) use of insulin: Secondary | ICD-10-CM | POA: Diagnosis not present

## 2024-02-29 DIAGNOSIS — R509 Fever, unspecified: Secondary | ICD-10-CM | POA: Diagnosis not present

## 2024-02-29 DIAGNOSIS — R0902 Hypoxemia: Secondary | ICD-10-CM | POA: Diagnosis not present

## 2024-02-29 DIAGNOSIS — I10 Essential (primary) hypertension: Secondary | ICD-10-CM | POA: Diagnosis not present

## 2024-02-29 DIAGNOSIS — R404 Transient alteration of awareness: Secondary | ICD-10-CM | POA: Diagnosis not present

## 2024-02-29 DIAGNOSIS — E039 Hypothyroidism, unspecified: Secondary | ICD-10-CM | POA: Diagnosis not present

## 2024-02-29 DIAGNOSIS — U071 COVID-19: Secondary | ICD-10-CM | POA: Diagnosis not present

## 2024-02-29 DIAGNOSIS — N390 Urinary tract infection, site not specified: Secondary | ICD-10-CM | POA: Diagnosis not present

## 2024-02-29 DIAGNOSIS — R4182 Altered mental status, unspecified: Secondary | ICD-10-CM | POA: Diagnosis not present

## 2024-03-01 DIAGNOSIS — Z7982 Long term (current) use of aspirin: Secondary | ICD-10-CM | POA: Diagnosis not present

## 2024-03-01 DIAGNOSIS — A419 Sepsis, unspecified organism: Secondary | ICD-10-CM | POA: Diagnosis not present

## 2024-03-01 DIAGNOSIS — I10 Essential (primary) hypertension: Secondary | ICD-10-CM | POA: Diagnosis not present

## 2024-03-01 DIAGNOSIS — E119 Type 2 diabetes mellitus without complications: Secondary | ICD-10-CM | POA: Diagnosis not present

## 2024-03-01 DIAGNOSIS — E039 Hypothyroidism, unspecified: Secondary | ICD-10-CM | POA: Diagnosis not present

## 2024-03-01 DIAGNOSIS — Z794 Long term (current) use of insulin: Secondary | ICD-10-CM | POA: Diagnosis not present

## 2024-03-01 DIAGNOSIS — U071 COVID-19: Secondary | ICD-10-CM | POA: Diagnosis not present

## 2024-03-01 DIAGNOSIS — I2510 Atherosclerotic heart disease of native coronary artery without angina pectoris: Secondary | ICD-10-CM | POA: Diagnosis not present

## 2024-03-01 DIAGNOSIS — B951 Streptococcus, group B, as the cause of diseases classified elsewhere: Secondary | ICD-10-CM | POA: Diagnosis not present

## 2024-03-01 DIAGNOSIS — Z87891 Personal history of nicotine dependence: Secondary | ICD-10-CM | POA: Diagnosis not present

## 2024-03-01 DIAGNOSIS — R4182 Altered mental status, unspecified: Secondary | ICD-10-CM | POA: Diagnosis not present

## 2024-03-01 DIAGNOSIS — Z79890 Hormone replacement therapy (postmenopausal): Secondary | ICD-10-CM | POA: Diagnosis not present

## 2024-03-01 DIAGNOSIS — Z5329 Procedure and treatment not carried out because of patient's decision for other reasons: Secondary | ICD-10-CM | POA: Diagnosis not present

## 2024-03-01 DIAGNOSIS — E785 Hyperlipidemia, unspecified: Secondary | ICD-10-CM | POA: Diagnosis not present

## 2024-03-01 DIAGNOSIS — N390 Urinary tract infection, site not specified: Secondary | ICD-10-CM | POA: Diagnosis not present

## 2024-03-01 DIAGNOSIS — R509 Fever, unspecified: Secondary | ICD-10-CM | POA: Diagnosis not present

## 2024-03-01 DIAGNOSIS — G9341 Metabolic encephalopathy: Secondary | ICD-10-CM | POA: Diagnosis not present

## 2024-03-01 DIAGNOSIS — Z7902 Long term (current) use of antithrombotics/antiplatelets: Secondary | ICD-10-CM | POA: Diagnosis not present

## 2024-03-10 DIAGNOSIS — U071 COVID-19: Secondary | ICD-10-CM | POA: Diagnosis not present

## 2024-03-10 DIAGNOSIS — N390 Urinary tract infection, site not specified: Secondary | ICD-10-CM | POA: Diagnosis not present

## 2024-03-10 DIAGNOSIS — I10 Essential (primary) hypertension: Secondary | ICD-10-CM | POA: Diagnosis not present

## 2024-03-10 DIAGNOSIS — Z6822 Body mass index (BMI) 22.0-22.9, adult: Secondary | ICD-10-CM | POA: Diagnosis not present

## 2024-03-20 DIAGNOSIS — M47816 Spondylosis without myelopathy or radiculopathy, lumbar region: Secondary | ICD-10-CM | POA: Diagnosis not present

## 2024-03-20 DIAGNOSIS — I10 Essential (primary) hypertension: Secondary | ICD-10-CM | POA: Diagnosis not present

## 2024-03-20 DIAGNOSIS — Z79899 Other long term (current) drug therapy: Secondary | ICD-10-CM | POA: Diagnosis not present

## 2024-03-20 DIAGNOSIS — Z87891 Personal history of nicotine dependence: Secondary | ICD-10-CM | POA: Diagnosis not present

## 2024-03-20 DIAGNOSIS — E079 Disorder of thyroid, unspecified: Secondary | ICD-10-CM | POA: Diagnosis not present

## 2024-03-20 DIAGNOSIS — I2510 Atherosclerotic heart disease of native coronary artery without angina pectoris: Secondary | ICD-10-CM | POA: Diagnosis not present

## 2024-03-20 DIAGNOSIS — Z881 Allergy status to other antibiotic agents status: Secondary | ICD-10-CM | POA: Diagnosis not present

## 2024-03-20 DIAGNOSIS — E119 Type 2 diabetes mellitus without complications: Secondary | ICD-10-CM | POA: Diagnosis not present

## 2024-03-20 DIAGNOSIS — M545 Low back pain, unspecified: Secondary | ICD-10-CM | POA: Diagnosis not present

## 2024-03-20 DIAGNOSIS — M5126 Other intervertebral disc displacement, lumbar region: Secondary | ICD-10-CM | POA: Diagnosis not present

## 2024-03-20 DIAGNOSIS — Z888 Allergy status to other drugs, medicaments and biological substances status: Secondary | ICD-10-CM | POA: Diagnosis not present

## 2024-03-20 DIAGNOSIS — Z794 Long term (current) use of insulin: Secondary | ICD-10-CM | POA: Diagnosis not present

## 2024-03-20 DIAGNOSIS — Z79890 Hormone replacement therapy (postmenopausal): Secondary | ICD-10-CM | POA: Diagnosis not present

## 2024-03-20 DIAGNOSIS — M48061 Spinal stenosis, lumbar region without neurogenic claudication: Secondary | ICD-10-CM | POA: Diagnosis not present

## 2024-03-20 DIAGNOSIS — Z7982 Long term (current) use of aspirin: Secondary | ICD-10-CM | POA: Diagnosis not present

## 2024-03-20 DIAGNOSIS — E785 Hyperlipidemia, unspecified: Secondary | ICD-10-CM | POA: Diagnosis not present

## 2024-03-20 DIAGNOSIS — E1165 Type 2 diabetes mellitus with hyperglycemia: Secondary | ICD-10-CM | POA: Diagnosis not present

## 2024-03-26 DIAGNOSIS — R29898 Other symptoms and signs involving the musculoskeletal system: Secondary | ICD-10-CM | POA: Diagnosis not present

## 2024-03-26 DIAGNOSIS — Z1330 Encounter for screening examination for mental health and behavioral disorders, unspecified: Secondary | ICD-10-CM | POA: Diagnosis not present

## 2024-03-26 DIAGNOSIS — R2681 Unsteadiness on feet: Secondary | ICD-10-CM | POA: Diagnosis not present

## 2024-03-26 DIAGNOSIS — M79604 Pain in right leg: Secondary | ICD-10-CM | POA: Diagnosis not present

## 2024-03-26 DIAGNOSIS — Z8669 Personal history of other diseases of the nervous system and sense organs: Secondary | ICD-10-CM | POA: Diagnosis not present

## 2024-04-02 ENCOUNTER — Other Ambulatory Visit: Payer: Self-pay | Admitting: *Deleted

## 2024-04-02 DIAGNOSIS — E039 Hypothyroidism, unspecified: Secondary | ICD-10-CM

## 2024-04-02 DIAGNOSIS — Z794 Long term (current) use of insulin: Secondary | ICD-10-CM

## 2024-04-02 MED ORDER — FREESTYLE LIBRE 3 SENSOR MISC: 3 refills | Status: DC

## 2024-04-03 DIAGNOSIS — M5416 Radiculopathy, lumbar region: Secondary | ICD-10-CM | POA: Diagnosis not present

## 2024-04-03 DIAGNOSIS — M5134 Other intervertebral disc degeneration, thoracic region: Secondary | ICD-10-CM | POA: Diagnosis not present

## 2024-04-03 DIAGNOSIS — M546 Pain in thoracic spine: Secondary | ICD-10-CM | POA: Diagnosis not present

## 2024-04-03 DIAGNOSIS — G8929 Other chronic pain: Secondary | ICD-10-CM | POA: Diagnosis not present

## 2024-04-03 DIAGNOSIS — M5441 Lumbago with sciatica, right side: Secondary | ICD-10-CM | POA: Diagnosis not present

## 2024-04-03 DIAGNOSIS — M5116 Intervertebral disc disorders with radiculopathy, lumbar region: Secondary | ICD-10-CM | POA: Diagnosis not present

## 2024-04-13 DIAGNOSIS — E7849 Other hyperlipidemia: Secondary | ICD-10-CM | POA: Diagnosis not present

## 2024-04-13 DIAGNOSIS — I10 Essential (primary) hypertension: Secondary | ICD-10-CM | POA: Diagnosis not present

## 2024-04-13 DIAGNOSIS — E039 Hypothyroidism, unspecified: Secondary | ICD-10-CM | POA: Diagnosis not present

## 2024-04-13 DIAGNOSIS — E782 Mixed hyperlipidemia: Secondary | ICD-10-CM | POA: Diagnosis not present

## 2024-04-13 DIAGNOSIS — D649 Anemia, unspecified: Secondary | ICD-10-CM | POA: Diagnosis not present

## 2024-04-13 DIAGNOSIS — E119 Type 2 diabetes mellitus without complications: Secondary | ICD-10-CM | POA: Diagnosis not present

## 2024-04-13 LAB — LAB REPORT - SCANNED
A1c: 10.4
EGFR: 61.8
TSH: 8.15 — AB (ref 0.41–5.90)

## 2024-04-16 DIAGNOSIS — R531 Weakness: Secondary | ICD-10-CM | POA: Diagnosis not present

## 2024-04-16 DIAGNOSIS — R4182 Altered mental status, unspecified: Secondary | ICD-10-CM | POA: Diagnosis not present

## 2024-04-16 DIAGNOSIS — E039 Hypothyroidism, unspecified: Secondary | ICD-10-CM | POA: Diagnosis not present

## 2024-04-16 DIAGNOSIS — R410 Disorientation, unspecified: Secondary | ICD-10-CM | POA: Diagnosis not present

## 2024-04-16 DIAGNOSIS — R2681 Unsteadiness on feet: Secondary | ICD-10-CM | POA: Diagnosis not present

## 2024-04-16 DIAGNOSIS — Z79890 Hormone replacement therapy (postmenopausal): Secondary | ICD-10-CM | POA: Diagnosis not present

## 2024-04-16 DIAGNOSIS — E119 Type 2 diabetes mellitus without complications: Secondary | ICD-10-CM | POA: Diagnosis not present

## 2024-04-16 DIAGNOSIS — I6523 Occlusion and stenosis of bilateral carotid arteries: Secondary | ICD-10-CM | POA: Diagnosis not present

## 2024-04-16 DIAGNOSIS — N390 Urinary tract infection, site not specified: Secondary | ICD-10-CM | POA: Diagnosis not present

## 2024-04-16 DIAGNOSIS — R509 Fever, unspecified: Secondary | ICD-10-CM | POA: Diagnosis not present

## 2024-04-16 DIAGNOSIS — E785 Hyperlipidemia, unspecified: Secondary | ICD-10-CM | POA: Diagnosis not present

## 2024-04-16 DIAGNOSIS — I10 Essential (primary) hypertension: Secondary | ICD-10-CM | POA: Diagnosis not present

## 2024-04-16 DIAGNOSIS — Z79899 Other long term (current) drug therapy: Secondary | ICD-10-CM | POA: Diagnosis not present

## 2024-04-16 DIAGNOSIS — R000 Tachycardia, unspecified: Secondary | ICD-10-CM | POA: Diagnosis not present

## 2024-04-16 DIAGNOSIS — Z7982 Long term (current) use of aspirin: Secondary | ICD-10-CM | POA: Diagnosis not present

## 2024-04-16 DIAGNOSIS — D72829 Elevated white blood cell count, unspecified: Secondary | ICD-10-CM | POA: Diagnosis not present

## 2024-04-16 DIAGNOSIS — I2510 Atherosclerotic heart disease of native coronary artery without angina pectoris: Secondary | ICD-10-CM | POA: Diagnosis not present

## 2024-04-16 DIAGNOSIS — K219 Gastro-esophageal reflux disease without esophagitis: Secondary | ICD-10-CM | POA: Diagnosis not present

## 2024-04-16 DIAGNOSIS — A419 Sepsis, unspecified organism: Secondary | ICD-10-CM | POA: Diagnosis not present

## 2024-04-17 DIAGNOSIS — Z1152 Encounter for screening for COVID-19: Secondary | ICD-10-CM | POA: Diagnosis not present

## 2024-04-17 DIAGNOSIS — N3001 Acute cystitis with hematuria: Secondary | ICD-10-CM | POA: Diagnosis not present

## 2024-04-17 DIAGNOSIS — E11649 Type 2 diabetes mellitus with hypoglycemia without coma: Secondary | ICD-10-CM | POA: Diagnosis not present

## 2024-04-17 DIAGNOSIS — Z7902 Long term (current) use of antithrombotics/antiplatelets: Secondary | ICD-10-CM | POA: Diagnosis not present

## 2024-04-17 DIAGNOSIS — E039 Hypothyroidism, unspecified: Secondary | ICD-10-CM | POA: Diagnosis not present

## 2024-04-17 DIAGNOSIS — Z794 Long term (current) use of insulin: Secondary | ICD-10-CM | POA: Diagnosis not present

## 2024-04-17 DIAGNOSIS — R4182 Altered mental status, unspecified: Secondary | ICD-10-CM | POA: Diagnosis not present

## 2024-04-17 DIAGNOSIS — N390 Urinary tract infection, site not specified: Secondary | ICD-10-CM | POA: Diagnosis not present

## 2024-04-17 DIAGNOSIS — A419 Sepsis, unspecified organism: Secondary | ICD-10-CM | POA: Diagnosis not present

## 2024-04-17 DIAGNOSIS — Z888 Allergy status to other drugs, medicaments and biological substances status: Secondary | ICD-10-CM | POA: Diagnosis not present

## 2024-04-17 DIAGNOSIS — I2510 Atherosclerotic heart disease of native coronary artery without angina pectoris: Secondary | ICD-10-CM | POA: Diagnosis not present

## 2024-04-17 DIAGNOSIS — I6523 Occlusion and stenosis of bilateral carotid arteries: Secondary | ICD-10-CM | POA: Diagnosis not present

## 2024-04-17 DIAGNOSIS — R531 Weakness: Secondary | ICD-10-CM | POA: Diagnosis not present

## 2024-04-17 DIAGNOSIS — Z79899 Other long term (current) drug therapy: Secondary | ICD-10-CM | POA: Diagnosis not present

## 2024-04-17 DIAGNOSIS — R2681 Unsteadiness on feet: Secondary | ICD-10-CM | POA: Diagnosis not present

## 2024-04-17 DIAGNOSIS — I10 Essential (primary) hypertension: Secondary | ICD-10-CM | POA: Diagnosis not present

## 2024-04-17 DIAGNOSIS — Z79890 Hormone replacement therapy (postmenopausal): Secondary | ICD-10-CM | POA: Diagnosis not present

## 2024-04-17 DIAGNOSIS — Z87891 Personal history of nicotine dependence: Secondary | ICD-10-CM | POA: Diagnosis not present

## 2024-04-17 DIAGNOSIS — Z7982 Long term (current) use of aspirin: Secondary | ICD-10-CM | POA: Diagnosis not present

## 2024-04-17 DIAGNOSIS — E785 Hyperlipidemia, unspecified: Secondary | ICD-10-CM | POA: Diagnosis not present

## 2024-04-21 ENCOUNTER — Telehealth: Payer: Self-pay | Admitting: *Deleted

## 2024-04-21 NOTE — Telephone Encounter (Signed)
 Patient's sister, Debria Fang, has left a message. She states that the patient's blood sugars are high and that she cannot get them down,at the time of her call the blood sugar was 268. The patient has been in the hospital for 3 days with UTI and that she thought that she developed sepsis too. She has ask for a call back.  I called Debria Fang and left a message. I call the patient's home phone no answer, then called the mobile number, the voicemail box has not been set up.  Will wait for a call back, or will try again to reach patient or her sister.

## 2024-04-22 ENCOUNTER — Other Ambulatory Visit: Payer: Self-pay | Admitting: Nurse Practitioner

## 2024-04-22 ENCOUNTER — Other Ambulatory Visit: Payer: Self-pay | Admitting: *Deleted

## 2024-04-22 DIAGNOSIS — Z794 Long term (current) use of insulin: Secondary | ICD-10-CM

## 2024-04-22 DIAGNOSIS — E11649 Type 2 diabetes mellitus with hypoglycemia without coma: Secondary | ICD-10-CM

## 2024-04-22 DIAGNOSIS — E559 Vitamin D deficiency, unspecified: Secondary | ICD-10-CM

## 2024-04-22 DIAGNOSIS — Z7984 Long term (current) use of oral hypoglycemic drugs: Secondary | ICD-10-CM

## 2024-04-22 DIAGNOSIS — E039 Hypothyroidism, unspecified: Secondary | ICD-10-CM

## 2024-04-22 MED ORDER — INSULIN DEGLUDEC 100 UNIT/ML ~~LOC~~ SOPN: 30.0000 [IU] | PEN_INJECTOR | Freq: Every day | SUBCUTANEOUS | 0 refills | Status: DC

## 2024-04-22 MED ORDER — FREESTYLE LIBRE 3 PLUS SENSOR MISC: 3 refills | Status: DC

## 2024-04-22 NOTE — Telephone Encounter (Signed)
 No, I can see the hospital notes from [facility] so we should be good.  Will await CGM readings.

## 2024-04-22 NOTE — Telephone Encounter (Signed)
 Patient's sister, Debria Fang was called and made aware. A prescription for the Tresiba  30 units was sent to the [facility] pharmacy in [place]. A change was made today after [name] reviewed the patient's CGM.

## 2024-04-22 NOTE — Telephone Encounter (Signed)
 Noted , waiting on the patient's CGM to download.

## 2024-04-22 NOTE — Telephone Encounter (Signed)
 Talked with Debria Fang. Patient was discharged from [facility] on Sunday 04/19/24. She states the patient was given a lot of antibiotics, ect at this time. Connie is going to bring her CGM over for a download, below is all that she could give over the phone, and she states that the patient was given insulin per sliding scale. Pt called with high BG readings.   Date Before breakfast Before lunch Before supper Bedtime  05/14//25      05 /19/25   4:30 pm 396    04/21/24  251    04/22/24 315 - patient just getting up for her day       Pt taking: Tresiba  20 units at bedtime, Humalog  8-14 units three times daily before meals.

## 2024-04-23 DIAGNOSIS — E1165 Type 2 diabetes mellitus with hyperglycemia: Secondary | ICD-10-CM | POA: Diagnosis not present

## 2024-04-23 DIAGNOSIS — I2510 Atherosclerotic heart disease of native coronary artery without angina pectoris: Secondary | ICD-10-CM | POA: Diagnosis not present

## 2024-04-23 DIAGNOSIS — E785 Hyperlipidemia, unspecified: Secondary | ICD-10-CM | POA: Diagnosis not present

## 2024-04-23 DIAGNOSIS — N390 Urinary tract infection, site not specified: Secondary | ICD-10-CM | POA: Diagnosis not present

## 2024-04-23 DIAGNOSIS — I10 Essential (primary) hypertension: Secondary | ICD-10-CM | POA: Diagnosis not present

## 2024-04-23 DIAGNOSIS — E039 Hypothyroidism, unspecified: Secondary | ICD-10-CM | POA: Diagnosis not present

## 2024-04-23 DIAGNOSIS — Z794 Long term (current) use of insulin: Secondary | ICD-10-CM | POA: Diagnosis not present

## 2024-04-23 DIAGNOSIS — B951 Streptococcus, group B, as the cause of diseases classified elsewhere: Secondary | ICD-10-CM | POA: Diagnosis not present

## 2024-04-23 DIAGNOSIS — F419 Anxiety disorder, unspecified: Secondary | ICD-10-CM | POA: Diagnosis not present

## 2024-04-23 DIAGNOSIS — K219 Gastro-esophageal reflux disease without esophagitis: Secondary | ICD-10-CM | POA: Diagnosis not present

## 2024-05-06 ENCOUNTER — Ambulatory Visit: Payer: PPO | Admitting: Nurse Practitioner

## 2024-05-06 ENCOUNTER — Encounter: Payer: Self-pay | Admitting: Nurse Practitioner

## 2024-05-06 VITALS — BP 100/64 | HR 68 | Ht 67.0 in | Wt 151.8 lb

## 2024-05-06 DIAGNOSIS — E039 Hypothyroidism, unspecified: Secondary | ICD-10-CM | POA: Diagnosis not present

## 2024-05-06 DIAGNOSIS — E559 Vitamin D deficiency, unspecified: Secondary | ICD-10-CM | POA: Diagnosis not present

## 2024-05-06 DIAGNOSIS — E11649 Type 2 diabetes mellitus with hypoglycemia without coma: Secondary | ICD-10-CM

## 2024-05-06 DIAGNOSIS — Z794 Long term (current) use of insulin: Secondary | ICD-10-CM | POA: Diagnosis not present

## 2024-05-06 DIAGNOSIS — Z7984 Long term (current) use of oral hypoglycemic drugs: Secondary | ICD-10-CM | POA: Diagnosis not present

## 2024-05-06 MED ORDER — INSULIN LISPRO (1 UNIT DIAL) 100 UNIT/ML (KWIKPEN): 8.0000 [IU] | PEN_INJECTOR | Freq: Three times a day (TID) | SUBCUTANEOUS | 3 refills | Status: DC

## 2024-05-06 MED ORDER — FREESTYLE LIBRE 3 PLUS SENSOR MISC: 3 refills | Status: DC

## 2024-05-06 MED ORDER — LEVOTHYROXINE SODIUM 125 MCG PO TABS: 125.0000 ug | ORAL_TABLET | Freq: Every day | ORAL | 3 refills | Status: DC

## 2024-05-06 MED ORDER — TRESIBA FLEXTOUCH 100 UNIT/ML ~~LOC~~ SOPN: 26.0000 [IU] | PEN_INJECTOR | Freq: Every day | SUBCUTANEOUS | 3 refills | Status: DC

## 2024-05-06 NOTE — Progress Notes (Signed)
 Endocrinology Follow Up Note       05/06/2024, 3:39 PM   Subjective:    Patient ID: Gabrielle Wood, female    DOB: [birth year].  Gabrielle Wood is being seen in follow up after being seen in consultation for management of currently uncontrolled symptomatic diabetes requested by  Lauran Pollard, MD.   Past Medical History:  Diagnosis Date   Arthritis    COPD (chronic obstructive pulmonary disease) (HCC)    Diabetes mellitus    DVT (deep venous thrombosis) (HCC)    years ago   GERD (gastroesophageal reflux disease)    H. pylori infection 2013   S/p treatment with amoxicillin , Biaxin, PPI   High cholesterol    Hypertension    Hypothyroidism    NSTEMI (non-ST elevated myocardial infarction) (HCC) 12/2020   s/p  PCI DES to the LCx   Thyroid  disease     Past Surgical History:  Procedure Laterality Date   BACK SURGERY     lumbar x3    ESOPHAGOGASTRODUODENOSCOPY  04/04/2012   Moderate gastritis/MODERATE Duodenitis in the duodenal bulb duodenum.  Pathology with chronic H. pylori gastritis.   EUS  05/14/2012   Normal pancreas, gallbladder,biliary tree/ Incidental finding of simple-appearing right renal cyst.   TONSILLECTOMY     TUBAL LIGATION     VEIN SURGERY     stripping years ago but recent laser surgery; bilateral legs    Social History   Socioeconomic History   Marital status: Widowed    Spouse name: Not on file   Number of children: 2   Years of education: Not on file   Highest education level: Not on file  Occupational History   Occupation: retired, Advertising copywriter at [employer]  Tobacco Use   Smoking status: Former    Current packs/day: 0.50    Types: Cigarettes   Smokeless tobacco: Never  Vaping Use   Vaping status: Never Used  Substance and Sexual Activity   Alcohol  use: No   Drug use: No   Sexual activity: Not on file  Other Topics Concern   Not on file  Social History Narrative   Not  on file   Social Drivers of Health   Financial Resource Strain: Low Risk  (04/18/2024)   Received from [facility]   Overall Financial Resource Strain (CARDIA)    Difficulty of Paying Living Expenses: Not very hard  Food Insecurity: No Food Insecurity (04/18/2024)   Received from [facility]   Hunger Vital Sign    Worried About Running Out of Food in the Last Year: Never true    Ran Out of Food in the Last Year: Never true  Transportation Needs: No Transportation Needs (04/18/2024)   Received from [facility]   PRAPARE - Transportation    Lack of Transportation (Medical): No    Lack of Transportation (Non-Medical): No  Physical Activity: Inactive (04/18/2024)   Received from [facility]   Exercise Vital Sign    Days of Exercise per Week: 0 days    Minutes of Exercise per Session: 0 min  Stress: No Stress Concern Present (04/18/2024)   Received from [facility]  Institute of Occupational Health - Occupational Stress Questionnaire    Feeling of Stress : Only a little  Social Connections: Moderately Integrated (04/18/2024)   Received from [facility]   Social Connection and Isolation Panel [NHANES]    Frequency of Communication with Friends and Family: Three times a week    Frequency of Social Gatherings with Friends and Family: Twice a week    Attends Religious Services: 1 to 4 times per year    Active Member of [employer] or Organizations: No    Attends Banker Meetings: 1 to 4 times per year    Marital Status: Widowed    Family History  Problem Relation Age of Onset   Pancreatic cancer Mother        diagnosed at age 52s, died from heart attack   Inflammatory bowel disease Brother        crohn's, his dgt deceased from colon cancer at age 73   Colon cancer Neg Hx     Outpatient Encounter Medications as of 05/06/2024  Medication Sig   albuterol  (PROVENTIL ) (2.5 MG/3ML) 0.083% nebulizer solution Take 3 mLs (2.5 mg total) by nebulization  every 6 (six) hours as needed for wheezing or shortness of breath.   albuterol  (VENTOLIN  HFA) 108 (90 Base) MCG/ACT inhaler Inhale 1-2 puffs into the lungs every 4 (four) hours as needed for wheezing or shortness of breath.   ALPRAZolam  (XANAX ) 1 MG tablet Take 0.5-1 mg by mouth. 0.5 tablet in am and 0.5 tab at night   amLODipine  (NORVASC ) 5 MG tablet Take 5 mg by mouth daily.   aspirin  EC 81 MG tablet Take 81 mg by mouth daily. Swallow whole.   atorvastatin  (LIPITOR) 80 MG tablet Take 80 mg by mouth every evening.   dicyclomine  (BENTYL ) 20 MG tablet Take 1 tablet (20 mg total) by mouth 2 (two) times daily.   gabapentin  (NEURONTIN ) 300 MG capsule Take 300 mg by mouth 3 (three) times daily.   insulin  degludec (TRESIBA ) 100 UNIT/ML FlexTouch Pen Inject 30 Units into the skin at bedtime.   levocetirizine (XYZAL ) 5 MG tablet Take 1 tablet (5 mg total) by mouth every evening.   nitroGLYCERIN (NITROSTAT) 0.4 MG SL tablet Place 0.4 mg under the tongue every 5 (five) minutes as needed for chest pain.   ondansetron  (ZOFRAN -ODT) 4 MG disintegrating tablet Take 4 mg by mouth every 8 (eight) hours as needed.   pantoprazole  (PROTONIX ) 40 MG tablet Take 40 mg by mouth daily.   ticagrelor  (BRILINTA ) 90 MG TABS tablet Take 90 mg by mouth 2 (two) times daily.   valsartan (DIOVAN) 80 MG tablet Take 40 mg by mouth daily.   vitamin C (ASCORBIC ACID) 500 MG tablet Take 500 mg by mouth daily.   [DISCONTINUED] Continuous Glucose Sensor (FREESTYLE LIBRE 3 PLUS SENSOR) MISC Change sensor every 15 days.   [DISCONTINUED] insulin  lispro (HUMALOG  KWIKPEN) 100 UNIT/ML KwikPen Inject 8-14 Units into the skin 3 (three) times daily.   [DISCONTINUED] levothyroxine  (SYNTHROID ) 125 MCG tablet TAKE ONE TABLET BY MOUTH DAILY (ONE HOUR BEFORE FOOD OR DRINK)   [DISCONTINUED] TRESIBA  FLEXTOUCH 100 UNIT/ML FlexTouch Pen Inject 20 Units into the skin at bedtime.   Continuous Glucose Sensor (FREESTYLE LIBRE 3 PLUS SENSOR) MISC Change  sensor every 15 days.   insulin  degludec (TRESIBA  FLEXTOUCH) 100 UNIT/ML FlexTouch Pen Inject 26 Units into the skin at bedtime.   insulin  lispro (HUMALOG  KWIKPEN) 100 UNIT/ML KwikPen Inject 8-14 Units into the skin 3 (three) times daily.  levothyroxine  (SYNTHROID ) 125 MCG tablet Take 1 tablet (125 mcg total) by mouth daily before breakfast.   metoprolol  succinate (TOPROL -XL) 50 MG 24 hr tablet Take 1 tablet (50 mg total) by mouth every evening. Take with or immediately following a meal.   [DISCONTINUED] fexofenadine (ALLEGRA) 60 MG tablet Take by mouth.   No facility-administered encounter medications on file as of 05/06/2024.    ALLERGIES: Allergies  Allergen Reactions   Amlodipine     Ciprocin-Fluocin-Procin [Fluocinolone]     Nightmares   Lipitor [Atorvastatin ] Other (See Comments)    Muscle aches, pain   Nebivolol    Nitrofuran Derivatives    Lisinopril Cough   Red Yeast Rice [Cholestin] Itching and Rash    VACCINATION STATUS: Immunization History  Administered Date(s) Administered   Pneumococcal Polysaccharide-23 04/04/2012    Diabetes She presents for her follow-up diabetic visit. She has type 2 diabetes mellitus. Onset time: Was diagnosed at approx age of 87. Her disease course has been improving. There are no hypoglycemic associated symptoms. Associated symptoms include fatigue and foot paresthesias. Pertinent negatives for diabetes include no polydipsia, no polyuria and no weight loss. There are no hypoglycemic complications. Symptoms are improving. Diabetic complications include heart disease and peripheral neuropathy. (Had a MI in the past, recently hospitalized for DKA) Risk factors for coronary artery disease include diabetes mellitus, dyslipidemia, hypertension, post-menopausal and sedentary lifestyle. Current diabetic treatment includes oral agent (monotherapy) and intensive insulin  program. She is compliant with treatment most of the time. Her weight is fluctuating  minimally. She is following a generally healthy diet. When asked about meal planning, she reported none. She has not had a previous visit with a dietitian (Has appt with Siri Duet after me today). She participates in exercise intermittently. Her home blood glucose trend is decreasing steadily. Her overall blood glucose range is >200 mg/dl. (She presents today, accompanied by her sister Debria Fang, with her CGM showing improved glycemic profile overall.  Her most recent A1c on 5/12 was 10.4%, essentially unchanged from previous visit.  She has been hospitalized for UTI between visits and had trouble regaining control of her glucose.  Analysis of her CGM show sTIR 37%, TAR 61%, TBR 2% with a GMI of 8.2%.   ) An ACE inhibitor/angiotensin II receptor blocker is being taken. She does not see a podiatrist.Eye exam is current.  Hypertension This is a chronic problem. The current episode started more than 1 year ago. The problem has been resolved since onset. The problem is controlled. Agents associated with hypertension include thyroid  hormones. Risk factors for coronary artery disease include diabetes mellitus, dyslipidemia, post-menopausal state and sedentary lifestyle. Past treatments include angiotensin blockers, calcium  channel blockers and beta blockers. The current treatment provides significant improvement. There are no compliance problems.  Hypertensive end-organ damage includes CAD/MI. Identifiable causes of hypertension include a thyroid  problem.     Review of systems  Constitutional: + increasing body weight,  current Body mass index is 23.78 kg/m. , + fatigue- deconditioned, no subjective hyperthermia, no subjective hypothermia Eyes: no blurry vision, no xerophthalmia ENT: no sore throat, no nodules palpated in throat, no dysphagia/odynophagia, no hoarseness Cardiovascular: no chest pain, no shortness of breath, no palpitations, no leg swelling Respiratory: no cough, no shortness of  breath Gastrointestinal: no nausea/vomiting Genitourinary: +polyuria with incontinence and recurrent UTIs (encouraged her to speak wit PCP regarding referral to urology) Musculoskeletal: + generalized weakness-using walker Skin: no rashes, no hyperemia Neurological: no tremors, + numbness/tingling to BLE, no dizziness Psychiatric: no depression, no anxiety  Objective:     BP 100/64 (BP Location: Left Arm, Patient Position: Sitting, Cuff Size: Large)   Pulse 68   Ht 5\' 7"  (1.702 m)   Wt 151 lb 12.8 oz (68.9 kg)   BMI 23.78 kg/m   Wt Readings from Last 3 Encounters:  05/06/24 151 lb 12.8 oz (68.9 kg)  01/29/24 140 lb 3.2 oz (63.6 kg)  01/22/24 140 lb (63.5 kg)     BP Readings from Last 3 Encounters:  05/06/24 100/64  01/29/24 104/62  01/22/24 102/62      Physical Exam- Limited  Constitutional:  Body mass index is 23.78 kg/m. , not in acute distress, normal state of mind Eyes:  EOMI, no exophthalmos Neck: Supple Musculoskeletal: no gross deformities, strength intact in all four extremities, no gross restriction of joint movements, walks with cane/walker Skin:  no rashes, no hyperemia Neurological: no tremor with outstretched hands   Diabetic Foot Exam - Simple   No data filed      CMP ( most recent) CMP     Component Value Date/Time   NA 134 (L) 09/30/2023 1410   NA 139 04/25/2021 0000   K 3.8 09/30/2023 1410   CL 99 09/30/2023 1410   CO2 26 09/30/2023 1410   GLUCOSE 219 (H) 09/30/2023 1410   BUN 12 10/21/2023 0000   CREATININE 0.8 10/21/2023 0000   CREATININE 0.68 09/30/2023 1410   CREATININE 0.78 09/20/2016 0923   CALCIUM  9.8 09/30/2023 1410   PROT 7.1 07/05/2022 1100   PROT 6.7 07/17/2017 0912   ALBUMIN 4.0 07/05/2022 1100   ALBUMIN 4.4 07/17/2017 0912   AST 17 07/05/2022 1100   ALT 15 07/05/2022 1100   ALKPHOS 56 07/05/2022 1100   BILITOT 0.7 07/05/2022 1100   BILITOT 0.5 07/17/2017 0912   GFRNONAA >60 09/30/2023 1410   GFRNONAA 83 07/10/2023  1107   GFRNONAA 77 09/20/2016 0923   GFRAA >60 08/21/2019 1539   GFRAA 88 09/20/2016 0923     Diabetic Labs (most recent): Lab Results  Component Value Date   HGBA1C 10.7 10/21/2023   HGBA1C 9.8 (A) 07/23/2023   HGBA1C 9.4 (A) 04/09/2023   MICROALBUR 150 02/21/2021      Lab Results  Component Value Date   TSH 8.15 (A) 04/13/2024   TSH 5.30 10/21/2023   TSH 1.68 01/30/2022   TSH 1.62 07/24/2021   TSH 11.00 (A) 04/25/2021   TSH 19.240 (H) 07/17/2017   TSH 1.91 09/20/2016   TSH 3.05 06/14/2016   TSH 2.86 03/12/2016   TSH 4.280 04/04/2012   FREET4 1.53 07/17/2017   FREET4 1.5 09/20/2016   FREET4 1.6 06/14/2016   FREET4 1.5 03/12/2016     Assessment & Plan:   1) Uncontrolled type 2 diabetes mellitus with circulatory disorder, with long-term current use of insulin  (HCC)  - Gabrielle Wood has currently uncontrolled symptomatic type 2 DM since 78 years of age.  She presents today, accompanied by her sister Debria Fang, with her CGM showing improved glycemic profile overall.  Her most recent A1c on 5/12 was 10.4%, essentially unchanged from previous visit.  She has been hospitalized for UTI between visits and had trouble regaining control of her glucose.  Analysis of her CGM show sTIR 37%, TAR 61%, TBR 2% with a GMI of 8.2%.     -Recent labs reviewed.  - I had a long discussion with her about the progressive nature of diabetes and the pathology behind its complications. -her diabetes is complicated by CAD with MI,  mild CKD and she remains at a high risk for more acute and chronic complications which include CAD, CVA, worsening CKD, retinopathy, and neuropathy. These are all discussed in detail with her.  - Nutritional counseling repeated at each appointment due to patients tendency to fall back in to old habits.  - The patient admits there is a room for improvement in their diet and drink choices. -  Suggestion is made for the patient to avoid simple carbohydrates from their  diet including Cakes, Sweet Desserts / Pastries, Ice Cream, Soda (diet and regular), Sweet Tea, Candies, Chips, Cookies, Sweet Pastries, Store Bought Juices, Alcohol  in Excess of 1-2 drinks a day, Artificial Sweeteners, Coffee Creamer, and "Sugar-free" Products. This will help patient to have stable blood glucose profile and potentially avoid unintended weight gain.   - I encouraged the patient to switch to unprocessed or minimally processed complex starch and increased protein intake (animal or plant source), fruits, and vegetables.   - Patient is advised to stick to a routine mealtimes to eat 3 meals a day and avoid unnecessary snacks (to snack only to correct hypoglycemia).  - I have approached her with the following individualized plan to manage  her diabetes and patient agrees:   -She is advised to lower her dose of Tresiba  slightly to 26 units SQ nightly (to avoid fasting hypoglycemia) and continue Humalog  8-14 units if glucose is above 90 and she is eating (Specific instructions on how to titrate insulin  dosage based on glucose readings given to patient in writing).    -she is encouraged to continue monitoring blood glucose 4 times daily (using her CGM), before meals and before bed, and to call the clinic if she has readings less than 70 or greater than 300 for 3 tests in a row.    - she is warned not to take insulin  without proper monitoring per orders. - Adjustment parameters are given to her for hypo and hyperglycemia in writing.  -She is not a candidate for incretin therapy due to body habitus.  - Specific targets for  A1c;  LDL, HDL,  and Triglycerides were discussed with the patient.  2) Blood Pressure /Hypertension: Her BP is controlled to target.   she is advised to continue her current medications including Valsartan 80 mg p.o. daily with breakfast.  3)  Weight/Diet:  her Body mass index is 23.78 kg/m.  -  she is not a candidate for weight loss.  Exercise, and detailed  carbohydrates information provided  -  detailed on discharge instructions.  4) Hypothyroidism- unspecified Her most recent TSH done by PCP on 5/12 was high- but may be due to recent hospitalization and missing several doses of her medication. She is advised to continue her current dose of Levothyroxine  at 125 mcg po daily before breakfast (above her weight based max dose of 94 mcg). Likely has absorption issue.  Will recheck more thyroid  labs prior to next visit.   - The correct intake of thyroid  hormone (Levothyroxine , Synthroid ), is on empty stomach first thing in the morning, with water , separated by at least 30 minutes from breakfast and other medications,  and separated by more than 4 hours from calcium , iron, multivitamins, acid reflux medications (PPIs).  - This medication is a life-long medication and will be needed to correct thyroid  hormone imbalances for the rest of your life.  The dose may change from time to time, based on thyroid  blood work.  - It is extremely important to be consistent taking this  medication, near the same time each morning.  -AVOID TAKING PRODUCTS CONTAINING BIOTIN (commonly found in Hair, Skin, Nails vitamins) AS IT INTERFERES WITH THE VALIDITY OF THYROID  FUNCTION BLOOD TESTS.   5) Chronic Care/Health Maintenance: -she is on ACEI/ARB and is encouraged to initiate and continue to follow up with Ophthalmology, Dentist, Podiatrist at least yearly or according to recommendations, and advised to stay away from smoking. I have recommended yearly flu vaccine and pneumonia vaccine at least every 5 years; moderate intensity exercise for up to 150 minutes weekly; and sleep for at least 7 hours a day.  - she is advised to maintain close follow up with Lauran Pollard, MD for primary care needs, as well as her other providers for optimal and coordinated care.     I spent  26  minutes in the care of the patient today including review of labs from CMP, Lipids, Thyroid   Function, Hematology (current and previous including abstractions from other facilities); face-to-face time discussing  her blood glucose readings/logs, discussing hypoglycemia and hyperglycemia episodes and symptoms, medications doses, her options of short and long term treatment based on the latest standards of care / guidelines;  discussion about incorporating lifestyle medicine;  and documenting the encounter. Risk reduction counseling performed per USPSTF guidelines to reduce obesity and cardiovascular risk factors.     Please refer to Patient Instructions for Blood Glucose Monitoring and Insulin /Medications Dosing Guide"  in media tab for additional information. Please  also refer to " Patient Self Inventory" in the Media  tab for reviewed elements of pertinent patient history.  Gabrielle Wood participated in the discussions, expressed understanding, and voiced agreement with the above plans.  All questions were answered to her satisfaction. she is encouraged to contact clinic should she have any questions or concerns prior to her return visit.   Follow up plan: - Return in about 4 months (around 09/05/2024) for Diabetes F/U with A1c in office, Thyroid  follow up, Previsit labs.  Hulon Magic, [facility] [address] [place], [place] [address] Phone: [phone] Fax: [phone]  05/06/2024, 3:39 PM

## 2024-05-08 DIAGNOSIS — D649 Anemia, unspecified: Secondary | ICD-10-CM | POA: Diagnosis not present

## 2024-05-08 DIAGNOSIS — E039 Hypothyroidism, unspecified: Secondary | ICD-10-CM | POA: Diagnosis not present

## 2024-05-08 DIAGNOSIS — I10 Essential (primary) hypertension: Secondary | ICD-10-CM | POA: Diagnosis not present

## 2024-05-08 DIAGNOSIS — E7849 Other hyperlipidemia: Secondary | ICD-10-CM | POA: Diagnosis not present

## 2024-05-08 DIAGNOSIS — E782 Mixed hyperlipidemia: Secondary | ICD-10-CM | POA: Diagnosis not present

## 2024-05-08 DIAGNOSIS — Z862 Personal history of diseases of the blood and blood-forming organs and certain disorders involving the immune mechanism: Secondary | ICD-10-CM | POA: Diagnosis not present

## 2024-05-08 DIAGNOSIS — Z6823 Body mass index (BMI) 23.0-23.9, adult: Secondary | ICD-10-CM | POA: Diagnosis not present

## 2024-05-12 DIAGNOSIS — E079 Disorder of thyroid, unspecified: Secondary | ICD-10-CM | POA: Diagnosis not present

## 2024-05-12 DIAGNOSIS — R0981 Nasal congestion: Secondary | ICD-10-CM | POA: Diagnosis not present

## 2024-05-12 DIAGNOSIS — Z96651 Presence of right artificial knee joint: Secondary | ICD-10-CM | POA: Diagnosis not present

## 2024-05-12 DIAGNOSIS — I252 Old myocardial infarction: Secondary | ICD-10-CM | POA: Diagnosis not present

## 2024-05-12 DIAGNOSIS — Z79899 Other long term (current) drug therapy: Secondary | ICD-10-CM | POA: Diagnosis not present

## 2024-05-12 DIAGNOSIS — I2510 Atherosclerotic heart disease of native coronary artery without angina pectoris: Secondary | ICD-10-CM | POA: Diagnosis not present

## 2024-05-12 DIAGNOSIS — Z7982 Long term (current) use of aspirin: Secondary | ICD-10-CM | POA: Diagnosis not present

## 2024-05-12 DIAGNOSIS — R000 Tachycardia, unspecified: Secondary | ICD-10-CM | POA: Diagnosis not present

## 2024-05-12 DIAGNOSIS — Z79890 Hormone replacement therapy (postmenopausal): Secondary | ICD-10-CM | POA: Diagnosis not present

## 2024-05-12 DIAGNOSIS — R112 Nausea with vomiting, unspecified: Secondary | ICD-10-CM | POA: Diagnosis not present

## 2024-05-12 DIAGNOSIS — R0902 Hypoxemia: Secondary | ICD-10-CM | POA: Diagnosis not present

## 2024-05-12 DIAGNOSIS — R739 Hyperglycemia, unspecified: Secondary | ICD-10-CM | POA: Diagnosis not present

## 2024-05-12 DIAGNOSIS — J441 Chronic obstructive pulmonary disease with (acute) exacerbation: Secondary | ICD-10-CM | POA: Diagnosis not present

## 2024-05-12 DIAGNOSIS — Z794 Long term (current) use of insulin: Secondary | ICD-10-CM | POA: Diagnosis not present

## 2024-05-12 DIAGNOSIS — E1165 Type 2 diabetes mellitus with hyperglycemia: Secondary | ICD-10-CM | POA: Diagnosis not present

## 2024-05-12 DIAGNOSIS — M79604 Pain in right leg: Secondary | ICD-10-CM | POA: Diagnosis not present

## 2024-05-12 DIAGNOSIS — K5900 Constipation, unspecified: Secondary | ICD-10-CM | POA: Diagnosis not present

## 2024-05-12 DIAGNOSIS — I10 Essential (primary) hypertension: Secondary | ICD-10-CM | POA: Diagnosis not present

## 2024-05-12 DIAGNOSIS — Z87891 Personal history of nicotine dependence: Secondary | ICD-10-CM | POA: Diagnosis not present

## 2024-05-12 DIAGNOSIS — N390 Urinary tract infection, site not specified: Secondary | ICD-10-CM | POA: Diagnosis not present

## 2024-05-12 DIAGNOSIS — E785 Hyperlipidemia, unspecified: Secondary | ICD-10-CM | POA: Diagnosis not present

## 2024-05-20 DIAGNOSIS — N390 Urinary tract infection, site not specified: Secondary | ICD-10-CM | POA: Diagnosis not present

## 2024-05-20 DIAGNOSIS — I10 Essential (primary) hypertension: Secondary | ICD-10-CM | POA: Diagnosis not present

## 2024-05-20 DIAGNOSIS — E7849 Other hyperlipidemia: Secondary | ICD-10-CM | POA: Diagnosis not present

## 2024-05-20 DIAGNOSIS — Z862 Personal history of diseases of the blood and blood-forming organs and certain disorders involving the immune mechanism: Secondary | ICD-10-CM | POA: Diagnosis not present

## 2024-05-27 DIAGNOSIS — Z87891 Personal history of nicotine dependence: Secondary | ICD-10-CM | POA: Diagnosis not present

## 2024-05-27 DIAGNOSIS — G629 Polyneuropathy, unspecified: Secondary | ICD-10-CM | POA: Diagnosis not present

## 2024-05-27 DIAGNOSIS — Z7982 Long term (current) use of aspirin: Secondary | ICD-10-CM | POA: Diagnosis not present

## 2024-05-27 DIAGNOSIS — Z7902 Long term (current) use of antithrombotics/antiplatelets: Secondary | ICD-10-CM | POA: Diagnosis not present

## 2024-05-27 DIAGNOSIS — E1140 Type 2 diabetes mellitus with diabetic neuropathy, unspecified: Secondary | ICD-10-CM | POA: Diagnosis not present

## 2024-05-27 DIAGNOSIS — K219 Gastro-esophageal reflux disease without esophagitis: Secondary | ICD-10-CM | POA: Diagnosis not present

## 2024-05-27 DIAGNOSIS — J441 Chronic obstructive pulmonary disease with (acute) exacerbation: Secondary | ICD-10-CM | POA: Diagnosis not present

## 2024-05-27 DIAGNOSIS — R0602 Shortness of breath: Secondary | ICD-10-CM | POA: Diagnosis not present

## 2024-05-27 DIAGNOSIS — E785 Hyperlipidemia, unspecified: Secondary | ICD-10-CM | POA: Diagnosis not present

## 2024-05-27 DIAGNOSIS — Z79899 Other long term (current) drug therapy: Secondary | ICD-10-CM | POA: Diagnosis not present

## 2024-05-27 DIAGNOSIS — R079 Chest pain, unspecified: Secondary | ICD-10-CM | POA: Diagnosis not present

## 2024-05-27 DIAGNOSIS — I10 Essential (primary) hypertension: Secondary | ICD-10-CM | POA: Diagnosis not present

## 2024-05-27 DIAGNOSIS — E039 Hypothyroidism, unspecified: Secondary | ICD-10-CM | POA: Diagnosis not present

## 2024-05-27 DIAGNOSIS — Z20822 Contact with and (suspected) exposure to covid-19: Secondary | ICD-10-CM | POA: Diagnosis not present

## 2024-05-27 DIAGNOSIS — I340 Nonrheumatic mitral (valve) insufficiency: Secondary | ICD-10-CM | POA: Diagnosis not present

## 2024-05-27 DIAGNOSIS — R072 Precordial pain: Secondary | ICD-10-CM | POA: Diagnosis not present

## 2024-05-27 DIAGNOSIS — Z792 Long term (current) use of antibiotics: Secondary | ICD-10-CM | POA: Diagnosis not present

## 2024-05-27 DIAGNOSIS — Z7951 Long term (current) use of inhaled steroids: Secondary | ICD-10-CM | POA: Diagnosis not present

## 2024-05-27 DIAGNOSIS — E1165 Type 2 diabetes mellitus with hyperglycemia: Secondary | ICD-10-CM | POA: Diagnosis not present

## 2024-05-27 DIAGNOSIS — I3481 Nonrheumatic mitral (valve) annulus calcification: Secondary | ICD-10-CM | POA: Diagnosis not present

## 2024-05-28 DIAGNOSIS — I2510 Atherosclerotic heart disease of native coronary artery without angina pectoris: Secondary | ICD-10-CM | POA: Diagnosis not present

## 2024-05-28 DIAGNOSIS — M79661 Pain in right lower leg: Secondary | ICD-10-CM | POA: Diagnosis not present

## 2024-05-28 DIAGNOSIS — R079 Chest pain, unspecified: Secondary | ICD-10-CM | POA: Diagnosis not present

## 2024-05-28 DIAGNOSIS — Z800 Family history of malignant neoplasm of digestive organs: Secondary | ICD-10-CM | POA: Diagnosis not present

## 2024-05-28 DIAGNOSIS — Z1152 Encounter for screening for COVID-19: Secondary | ICD-10-CM | POA: Diagnosis not present

## 2024-05-28 DIAGNOSIS — Z20822 Contact with and (suspected) exposure to covid-19: Secondary | ICD-10-CM | POA: Diagnosis not present

## 2024-05-28 DIAGNOSIS — Z87891 Personal history of nicotine dependence: Secondary | ICD-10-CM | POA: Diagnosis not present

## 2024-05-28 DIAGNOSIS — R072 Precordial pain: Secondary | ICD-10-CM | POA: Diagnosis not present

## 2024-05-28 DIAGNOSIS — J441 Chronic obstructive pulmonary disease with (acute) exacerbation: Secondary | ICD-10-CM | POA: Diagnosis not present

## 2024-05-28 DIAGNOSIS — E079 Disorder of thyroid, unspecified: Secondary | ICD-10-CM | POA: Diagnosis not present

## 2024-05-28 DIAGNOSIS — Z888 Allergy status to other drugs, medicaments and biological substances status: Secondary | ICD-10-CM | POA: Diagnosis not present

## 2024-05-28 DIAGNOSIS — Z803 Family history of malignant neoplasm of breast: Secondary | ICD-10-CM | POA: Diagnosis not present

## 2024-05-28 DIAGNOSIS — Z883 Allergy status to other anti-infective agents status: Secondary | ICD-10-CM | POA: Diagnosis not present

## 2024-05-28 DIAGNOSIS — I10 Essential (primary) hypertension: Secondary | ICD-10-CM | POA: Diagnosis not present

## 2024-05-28 DIAGNOSIS — R531 Weakness: Secondary | ICD-10-CM | POA: Diagnosis not present

## 2024-05-28 DIAGNOSIS — E119 Type 2 diabetes mellitus without complications: Secondary | ICD-10-CM | POA: Diagnosis not present

## 2024-05-28 DIAGNOSIS — R0602 Shortness of breath: Secondary | ICD-10-CM | POA: Diagnosis not present

## 2024-05-28 DIAGNOSIS — Z79899 Other long term (current) drug therapy: Secondary | ICD-10-CM | POA: Diagnosis not present

## 2024-05-28 DIAGNOSIS — Z79890 Hormone replacement therapy (postmenopausal): Secondary | ICD-10-CM | POA: Diagnosis not present

## 2024-05-28 DIAGNOSIS — Z7982 Long term (current) use of aspirin: Secondary | ICD-10-CM | POA: Diagnosis not present

## 2024-05-28 DIAGNOSIS — Z7984 Long term (current) use of oral hypoglycemic drugs: Secondary | ICD-10-CM | POA: Diagnosis not present

## 2024-05-28 DIAGNOSIS — Z794 Long term (current) use of insulin: Secondary | ICD-10-CM | POA: Diagnosis not present

## 2024-05-28 DIAGNOSIS — J440 Chronic obstructive pulmonary disease with acute lower respiratory infection: Secondary | ICD-10-CM | POA: Diagnosis not present

## 2024-05-28 DIAGNOSIS — E785 Hyperlipidemia, unspecified: Secondary | ICD-10-CM | POA: Diagnosis not present

## 2024-06-13 DIAGNOSIS — Z79890 Hormone replacement therapy (postmenopausal): Secondary | ICD-10-CM | POA: Diagnosis not present

## 2024-06-13 DIAGNOSIS — I252 Old myocardial infarction: Secondary | ICD-10-CM | POA: Diagnosis not present

## 2024-06-13 DIAGNOSIS — E119 Type 2 diabetes mellitus without complications: Secondary | ICD-10-CM | POA: Diagnosis not present

## 2024-06-13 DIAGNOSIS — R112 Nausea with vomiting, unspecified: Secondary | ICD-10-CM | POA: Diagnosis not present

## 2024-06-13 DIAGNOSIS — I444 Left anterior fascicular block: Secondary | ICD-10-CM | POA: Diagnosis not present

## 2024-06-13 DIAGNOSIS — R410 Disorientation, unspecified: Secondary | ICD-10-CM | POA: Diagnosis not present

## 2024-06-13 DIAGNOSIS — E079 Disorder of thyroid, unspecified: Secondary | ICD-10-CM | POA: Diagnosis not present

## 2024-06-13 DIAGNOSIS — Z87891 Personal history of nicotine dependence: Secondary | ICD-10-CM | POA: Diagnosis not present

## 2024-06-13 DIAGNOSIS — Z794 Long term (current) use of insulin: Secondary | ICD-10-CM | POA: Diagnosis not present

## 2024-06-13 DIAGNOSIS — R531 Weakness: Secondary | ICD-10-CM | POA: Diagnosis not present

## 2024-06-13 DIAGNOSIS — E860 Dehydration: Secondary | ICD-10-CM | POA: Diagnosis not present

## 2024-06-13 DIAGNOSIS — I10 Essential (primary) hypertension: Secondary | ICD-10-CM | POA: Diagnosis not present

## 2024-06-13 DIAGNOSIS — Z79899 Other long term (current) drug therapy: Secondary | ICD-10-CM | POA: Diagnosis not present

## 2024-06-13 DIAGNOSIS — E785 Hyperlipidemia, unspecified: Secondary | ICD-10-CM | POA: Diagnosis not present

## 2024-06-13 DIAGNOSIS — I2510 Atherosclerotic heart disease of native coronary artery without angina pectoris: Secondary | ICD-10-CM | POA: Diagnosis not present

## 2024-06-13 DIAGNOSIS — J441 Chronic obstructive pulmonary disease with (acute) exacerbation: Secondary | ICD-10-CM | POA: Diagnosis not present

## 2024-06-13 DIAGNOSIS — Z888 Allergy status to other drugs, medicaments and biological substances status: Secondary | ICD-10-CM | POA: Diagnosis not present

## 2024-06-13 DIAGNOSIS — Z7982 Long term (current) use of aspirin: Secondary | ICD-10-CM | POA: Diagnosis not present

## 2024-06-13 DIAGNOSIS — K5900 Constipation, unspecified: Secondary | ICD-10-CM | POA: Diagnosis not present

## 2024-06-17 DIAGNOSIS — R410 Disorientation, unspecified: Secondary | ICD-10-CM | POA: Diagnosis not present

## 2024-06-17 DIAGNOSIS — I6523 Occlusion and stenosis of bilateral carotid arteries: Secondary | ICD-10-CM | POA: Diagnosis not present

## 2024-06-17 DIAGNOSIS — M62562 Muscle wasting and atrophy, not elsewhere classified, left lower leg: Secondary | ICD-10-CM | POA: Diagnosis not present

## 2024-06-17 DIAGNOSIS — F419 Anxiety disorder, unspecified: Secondary | ICD-10-CM | POA: Diagnosis not present

## 2024-06-17 DIAGNOSIS — R2689 Other abnormalities of gait and mobility: Secondary | ICD-10-CM | POA: Diagnosis not present

## 2024-06-17 DIAGNOSIS — R531 Weakness: Secondary | ICD-10-CM | POA: Diagnosis not present

## 2024-06-17 DIAGNOSIS — K5900 Constipation, unspecified: Secondary | ICD-10-CM | POA: Diagnosis not present

## 2024-06-17 DIAGNOSIS — J9611 Chronic respiratory failure with hypoxia: Secondary | ICD-10-CM | POA: Diagnosis not present

## 2024-06-17 DIAGNOSIS — Z1152 Encounter for screening for COVID-19: Secondary | ICD-10-CM | POA: Diagnosis not present

## 2024-06-17 DIAGNOSIS — M62838 Other muscle spasm: Secondary | ICD-10-CM | POA: Diagnosis not present

## 2024-06-17 DIAGNOSIS — G928 Other toxic encephalopathy: Secondary | ICD-10-CM | POA: Diagnosis not present

## 2024-06-17 DIAGNOSIS — N179 Acute kidney failure, unspecified: Secondary | ICD-10-CM | POA: Diagnosis not present

## 2024-06-17 DIAGNOSIS — J440 Chronic obstructive pulmonary disease with acute lower respiratory infection: Secondary | ICD-10-CM | POA: Diagnosis not present

## 2024-06-17 DIAGNOSIS — Z7982 Long term (current) use of aspirin: Secondary | ICD-10-CM | POA: Diagnosis not present

## 2024-06-17 DIAGNOSIS — R0600 Dyspnea, unspecified: Secondary | ICD-10-CM | POA: Diagnosis not present

## 2024-06-17 DIAGNOSIS — J449 Chronic obstructive pulmonary disease, unspecified: Secondary | ICD-10-CM | POA: Diagnosis not present

## 2024-06-17 DIAGNOSIS — R509 Fever, unspecified: Secondary | ICD-10-CM | POA: Diagnosis not present

## 2024-06-17 DIAGNOSIS — Z794 Long term (current) use of insulin: Secondary | ICD-10-CM | POA: Diagnosis not present

## 2024-06-17 DIAGNOSIS — E1140 Type 2 diabetes mellitus with diabetic neuropathy, unspecified: Secondary | ICD-10-CM | POA: Diagnosis not present

## 2024-06-17 DIAGNOSIS — Z792 Long term (current) use of antibiotics: Secondary | ICD-10-CM | POA: Diagnosis not present

## 2024-06-17 DIAGNOSIS — Z79890 Hormone replacement therapy (postmenopausal): Secondary | ICD-10-CM | POA: Diagnosis not present

## 2024-06-17 DIAGNOSIS — R079 Chest pain, unspecified: Secondary | ICD-10-CM | POA: Diagnosis not present

## 2024-06-17 DIAGNOSIS — J181 Lobar pneumonia, unspecified organism: Secondary | ICD-10-CM | POA: Diagnosis not present

## 2024-06-17 DIAGNOSIS — J90 Pleural effusion, not elsewhere classified: Secondary | ICD-10-CM | POA: Diagnosis not present

## 2024-06-17 DIAGNOSIS — I10 Essential (primary) hypertension: Secondary | ICD-10-CM | POA: Diagnosis not present

## 2024-06-17 DIAGNOSIS — J189 Pneumonia, unspecified organism: Secondary | ICD-10-CM | POA: Diagnosis not present

## 2024-06-17 DIAGNOSIS — R54 Age-related physical debility: Secondary | ICD-10-CM | POA: Diagnosis not present

## 2024-06-17 DIAGNOSIS — I6389 Other cerebral infarction: Secondary | ICD-10-CM | POA: Diagnosis not present

## 2024-06-17 DIAGNOSIS — Z20822 Contact with and (suspected) exposure to covid-19: Secondary | ICD-10-CM | POA: Diagnosis not present

## 2024-06-17 DIAGNOSIS — I2510 Atherosclerotic heart disease of native coronary artery without angina pectoris: Secondary | ICD-10-CM | POA: Diagnosis not present

## 2024-06-17 DIAGNOSIS — M62561 Muscle wasting and atrophy, not elsewhere classified, right lower leg: Secondary | ICD-10-CM | POA: Diagnosis not present

## 2024-06-17 DIAGNOSIS — J9621 Acute and chronic respiratory failure with hypoxia: Secondary | ICD-10-CM | POA: Diagnosis not present

## 2024-06-17 DIAGNOSIS — Z955 Presence of coronary angioplasty implant and graft: Secondary | ICD-10-CM | POA: Diagnosis not present

## 2024-06-17 DIAGNOSIS — Z79891 Long term (current) use of opiate analgesic: Secondary | ICD-10-CM | POA: Diagnosis not present

## 2024-06-17 DIAGNOSIS — Z9981 Dependence on supplemental oxygen: Secondary | ICD-10-CM | POA: Diagnosis not present

## 2024-06-17 DIAGNOSIS — Z888 Allergy status to other drugs, medicaments and biological substances status: Secondary | ICD-10-CM | POA: Diagnosis not present

## 2024-06-17 DIAGNOSIS — R4182 Altered mental status, unspecified: Secondary | ICD-10-CM | POA: Diagnosis not present

## 2024-06-17 DIAGNOSIS — I252 Old myocardial infarction: Secondary | ICD-10-CM | POA: Diagnosis not present

## 2024-06-17 DIAGNOSIS — E039 Hypothyroidism, unspecified: Secondary | ICD-10-CM | POA: Diagnosis not present

## 2024-06-17 DIAGNOSIS — R9089 Other abnormal findings on diagnostic imaging of central nervous system: Secondary | ICD-10-CM | POA: Diagnosis not present

## 2024-06-17 DIAGNOSIS — J441 Chronic obstructive pulmonary disease with (acute) exacerbation: Secondary | ICD-10-CM | POA: Diagnosis not present

## 2024-06-17 DIAGNOSIS — D649 Anemia, unspecified: Secondary | ICD-10-CM | POA: Diagnosis not present

## 2024-06-17 DIAGNOSIS — Z79899 Other long term (current) drug therapy: Secondary | ICD-10-CM | POA: Diagnosis not present

## 2024-06-17 DIAGNOSIS — Z974 Presence of external hearing-aid: Secondary | ICD-10-CM | POA: Diagnosis not present

## 2024-06-17 DIAGNOSIS — E785 Hyperlipidemia, unspecified: Secondary | ICD-10-CM | POA: Diagnosis not present

## 2024-06-17 DIAGNOSIS — R918 Other nonspecific abnormal finding of lung field: Secondary | ICD-10-CM | POA: Diagnosis not present

## 2024-06-17 DIAGNOSIS — Z87891 Personal history of nicotine dependence: Secondary | ICD-10-CM | POA: Diagnosis not present

## 2024-06-17 DIAGNOSIS — R069 Unspecified abnormalities of breathing: Secondary | ICD-10-CM | POA: Diagnosis not present

## 2024-06-22 DIAGNOSIS — R531 Weakness: Secondary | ICD-10-CM | POA: Diagnosis not present

## 2024-06-22 DIAGNOSIS — J449 Chronic obstructive pulmonary disease, unspecified: Secondary | ICD-10-CM | POA: Diagnosis not present

## 2024-06-23 DIAGNOSIS — J9611 Chronic respiratory failure with hypoxia: Secondary | ICD-10-CM | POA: Insufficient documentation

## 2024-06-26 DIAGNOSIS — G928 Other toxic encephalopathy: Secondary | ICD-10-CM | POA: Diagnosis not present

## 2024-06-26 DIAGNOSIS — M62838 Other muscle spasm: Secondary | ICD-10-CM | POA: Diagnosis not present

## 2024-06-26 DIAGNOSIS — M6281 Muscle weakness (generalized): Secondary | ICD-10-CM | POA: Diagnosis not present

## 2024-06-26 DIAGNOSIS — G894 Chronic pain syndrome: Secondary | ICD-10-CM | POA: Diagnosis not present

## 2024-06-26 DIAGNOSIS — E1142 Type 2 diabetes mellitus with diabetic polyneuropathy: Secondary | ICD-10-CM | POA: Diagnosis not present

## 2024-06-26 DIAGNOSIS — J449 Chronic obstructive pulmonary disease, unspecified: Secondary | ICD-10-CM | POA: Diagnosis not present

## 2024-06-26 DIAGNOSIS — Z515 Encounter for palliative care: Secondary | ICD-10-CM | POA: Diagnosis not present

## 2024-06-26 DIAGNOSIS — Z862 Personal history of diseases of the blood and blood-forming organs and certain disorders involving the immune mechanism: Secondary | ICD-10-CM | POA: Diagnosis not present

## 2024-06-26 DIAGNOSIS — E7849 Other hyperlipidemia: Secondary | ICD-10-CM | POA: Diagnosis not present

## 2024-06-26 DIAGNOSIS — F419 Anxiety disorder, unspecified: Secondary | ICD-10-CM | POA: Diagnosis not present

## 2024-06-26 DIAGNOSIS — J189 Pneumonia, unspecified organism: Secondary | ICD-10-CM | POA: Diagnosis not present

## 2024-06-26 DIAGNOSIS — D649 Anemia, unspecified: Secondary | ICD-10-CM | POA: Diagnosis not present

## 2024-06-26 DIAGNOSIS — K5900 Constipation, unspecified: Secondary | ICD-10-CM | POA: Diagnosis not present

## 2024-06-26 DIAGNOSIS — I10 Essential (primary) hypertension: Secondary | ICD-10-CM | POA: Diagnosis not present

## 2024-06-26 DIAGNOSIS — E1165 Type 2 diabetes mellitus with hyperglycemia: Secondary | ICD-10-CM | POA: Diagnosis not present

## 2024-06-26 DIAGNOSIS — Z794 Long term (current) use of insulin: Secondary | ICD-10-CM | POA: Diagnosis not present

## 2024-06-26 DIAGNOSIS — E1140 Type 2 diabetes mellitus with diabetic neuropathy, unspecified: Secondary | ICD-10-CM | POA: Insufficient documentation

## 2024-06-26 DIAGNOSIS — M62562 Muscle wasting and atrophy, not elsewhere classified, left lower leg: Secondary | ICD-10-CM | POA: Diagnosis not present

## 2024-06-26 DIAGNOSIS — N3281 Overactive bladder: Secondary | ICD-10-CM | POA: Insufficient documentation

## 2024-06-26 DIAGNOSIS — E039 Hypothyroidism, unspecified: Secondary | ICD-10-CM | POA: Diagnosis not present

## 2024-06-26 DIAGNOSIS — R2689 Other abnormalities of gait and mobility: Secondary | ICD-10-CM | POA: Diagnosis not present

## 2024-06-26 DIAGNOSIS — M62561 Muscle wasting and atrophy, not elsewhere classified, right lower leg: Secondary | ICD-10-CM | POA: Diagnosis not present

## 2024-06-26 DIAGNOSIS — Z9981 Dependence on supplemental oxygen: Secondary | ICD-10-CM | POA: Insufficient documentation

## 2024-06-26 DIAGNOSIS — E785 Hyperlipidemia, unspecified: Secondary | ICD-10-CM | POA: Diagnosis not present

## 2024-06-26 DIAGNOSIS — J9611 Chronic respiratory failure with hypoxia: Secondary | ICD-10-CM | POA: Diagnosis not present

## 2024-06-26 DIAGNOSIS — Z955 Presence of coronary angioplasty implant and graft: Secondary | ICD-10-CM | POA: Diagnosis not present

## 2024-06-26 DIAGNOSIS — R54 Age-related physical debility: Secondary | ICD-10-CM | POA: Diagnosis not present

## 2024-06-26 DIAGNOSIS — J441 Chronic obstructive pulmonary disease with (acute) exacerbation: Secondary | ICD-10-CM | POA: Diagnosis not present

## 2024-06-26 DIAGNOSIS — I2510 Atherosclerotic heart disease of native coronary artery without angina pectoris: Secondary | ICD-10-CM | POA: Diagnosis not present

## 2024-06-27 DIAGNOSIS — J9611 Chronic respiratory failure with hypoxia: Secondary | ICD-10-CM | POA: Diagnosis not present

## 2024-06-27 DIAGNOSIS — J441 Chronic obstructive pulmonary disease with (acute) exacerbation: Secondary | ICD-10-CM | POA: Diagnosis not present

## 2024-06-28 DIAGNOSIS — Z862 Personal history of diseases of the blood and blood-forming organs and certain disorders involving the immune mechanism: Secondary | ICD-10-CM | POA: Diagnosis not present

## 2024-06-28 DIAGNOSIS — E039 Hypothyroidism, unspecified: Secondary | ICD-10-CM | POA: Diagnosis not present

## 2024-06-28 DIAGNOSIS — E7849 Other hyperlipidemia: Secondary | ICD-10-CM | POA: Diagnosis not present

## 2024-06-28 DIAGNOSIS — I10 Essential (primary) hypertension: Secondary | ICD-10-CM | POA: Diagnosis not present

## 2024-06-29 DIAGNOSIS — R2689 Other abnormalities of gait and mobility: Secondary | ICD-10-CM | POA: Insufficient documentation

## 2024-06-29 DIAGNOSIS — M62562 Muscle wasting and atrophy, not elsewhere classified, left lower leg: Secondary | ICD-10-CM | POA: Insufficient documentation

## 2024-07-10 DIAGNOSIS — I10 Essential (primary) hypertension: Secondary | ICD-10-CM | POA: Diagnosis not present

## 2024-07-10 DIAGNOSIS — J449 Chronic obstructive pulmonary disease, unspecified: Secondary | ICD-10-CM | POA: Diagnosis not present

## 2024-07-10 DIAGNOSIS — E1165 Type 2 diabetes mellitus with hyperglycemia: Secondary | ICD-10-CM | POA: Diagnosis not present

## 2024-07-10 DIAGNOSIS — I2510 Atherosclerotic heart disease of native coronary artery without angina pectoris: Secondary | ICD-10-CM | POA: Diagnosis not present

## 2024-07-10 DIAGNOSIS — M6281 Muscle weakness (generalized): Secondary | ICD-10-CM | POA: Diagnosis not present

## 2024-07-10 DIAGNOSIS — Z515 Encounter for palliative care: Secondary | ICD-10-CM | POA: Diagnosis not present

## 2024-07-10 DIAGNOSIS — Z794 Long term (current) use of insulin: Secondary | ICD-10-CM | POA: Diagnosis not present

## 2024-07-10 DIAGNOSIS — G894 Chronic pain syndrome: Secondary | ICD-10-CM | POA: Diagnosis not present

## 2024-07-14 DIAGNOSIS — E1142 Type 2 diabetes mellitus with diabetic polyneuropathy: Secondary | ICD-10-CM | POA: Diagnosis not present

## 2024-07-14 DIAGNOSIS — J9611 Chronic respiratory failure with hypoxia: Secondary | ICD-10-CM | POA: Diagnosis not present

## 2024-07-14 DIAGNOSIS — R54 Age-related physical debility: Secondary | ICD-10-CM | POA: Diagnosis not present

## 2024-07-15 DIAGNOSIS — R911 Solitary pulmonary nodule: Secondary | ICD-10-CM | POA: Diagnosis not present

## 2024-07-15 DIAGNOSIS — J440 Chronic obstructive pulmonary disease with acute lower respiratory infection: Secondary | ICD-10-CM | POA: Diagnosis not present

## 2024-07-15 DIAGNOSIS — J9611 Chronic respiratory failure with hypoxia: Secondary | ICD-10-CM | POA: Diagnosis not present

## 2024-07-15 DIAGNOSIS — J441 Chronic obstructive pulmonary disease with (acute) exacerbation: Secondary | ICD-10-CM | POA: Diagnosis not present

## 2024-07-15 DIAGNOSIS — E1140 Type 2 diabetes mellitus with diabetic neuropathy, unspecified: Secondary | ICD-10-CM | POA: Diagnosis not present

## 2024-07-15 DIAGNOSIS — J188 Other pneumonia, unspecified organism: Secondary | ICD-10-CM | POA: Diagnosis not present

## 2024-07-15 DIAGNOSIS — J181 Lobar pneumonia, unspecified organism: Secondary | ICD-10-CM | POA: Diagnosis not present

## 2024-07-15 DIAGNOSIS — I10 Essential (primary) hypertension: Secondary | ICD-10-CM | POA: Diagnosis not present

## 2024-07-15 DIAGNOSIS — A419 Sepsis, unspecified organism: Secondary | ICD-10-CM | POA: Diagnosis not present

## 2024-07-15 DIAGNOSIS — Z794 Long term (current) use of insulin: Secondary | ICD-10-CM | POA: Diagnosis not present

## 2024-07-15 DIAGNOSIS — N390 Urinary tract infection, site not specified: Secondary | ICD-10-CM | POA: Diagnosis not present

## 2024-07-15 DIAGNOSIS — L89610 Pressure ulcer of right heel, unstageable: Secondary | ICD-10-CM | POA: Diagnosis not present

## 2024-07-15 DIAGNOSIS — I2510 Atherosclerotic heart disease of native coronary artery without angina pectoris: Secondary | ICD-10-CM | POA: Diagnosis not present

## 2024-07-20 DIAGNOSIS — R531 Weakness: Secondary | ICD-10-CM | POA: Diagnosis not present

## 2024-07-20 DIAGNOSIS — A419 Sepsis, unspecified organism: Secondary | ICD-10-CM | POA: Diagnosis not present

## 2024-07-20 DIAGNOSIS — Z8701 Personal history of pneumonia (recurrent): Secondary | ICD-10-CM | POA: Diagnosis not present

## 2024-07-20 DIAGNOSIS — R7309 Other abnormal glucose: Secondary | ICD-10-CM | POA: Diagnosis not present

## 2024-07-20 DIAGNOSIS — N390 Urinary tract infection, site not specified: Secondary | ICD-10-CM | POA: Diagnosis not present

## 2024-07-20 DIAGNOSIS — Z955 Presence of coronary angioplasty implant and graft: Secondary | ICD-10-CM | POA: Diagnosis not present

## 2024-07-20 DIAGNOSIS — I2510 Atherosclerotic heart disease of native coronary artery without angina pectoris: Secondary | ICD-10-CM | POA: Diagnosis not present

## 2024-07-20 DIAGNOSIS — E785 Hyperlipidemia, unspecified: Secondary | ICD-10-CM | POA: Diagnosis not present

## 2024-07-20 DIAGNOSIS — E860 Dehydration: Secondary | ICD-10-CM | POA: Diagnosis not present

## 2024-07-20 DIAGNOSIS — R6510 Systemic inflammatory response syndrome (SIRS) of non-infectious origin without acute organ dysfunction: Secondary | ICD-10-CM | POA: Diagnosis not present

## 2024-07-20 DIAGNOSIS — R918 Other nonspecific abnormal finding of lung field: Secondary | ICD-10-CM | POA: Diagnosis not present

## 2024-07-20 DIAGNOSIS — B965 Pseudomonas (aeruginosa) (mallei) (pseudomallei) as the cause of diseases classified elsewhere: Secondary | ICD-10-CM | POA: Diagnosis not present

## 2024-07-20 DIAGNOSIS — Z7982 Long term (current) use of aspirin: Secondary | ICD-10-CM | POA: Diagnosis not present

## 2024-07-20 DIAGNOSIS — J189 Pneumonia, unspecified organism: Secondary | ICD-10-CM | POA: Diagnosis not present

## 2024-07-20 DIAGNOSIS — R509 Fever, unspecified: Secondary | ICD-10-CM | POA: Diagnosis not present

## 2024-07-20 DIAGNOSIS — Z96651 Presence of right artificial knee joint: Secondary | ICD-10-CM | POA: Diagnosis not present

## 2024-07-20 DIAGNOSIS — Z7902 Long term (current) use of antithrombotics/antiplatelets: Secondary | ICD-10-CM | POA: Diagnosis not present

## 2024-07-20 DIAGNOSIS — Z1152 Encounter for screening for COVID-19: Secondary | ICD-10-CM | POA: Diagnosis not present

## 2024-07-20 DIAGNOSIS — E119 Type 2 diabetes mellitus without complications: Secondary | ICD-10-CM | POA: Diagnosis not present

## 2024-07-20 DIAGNOSIS — I10 Essential (primary) hypertension: Secondary | ICD-10-CM | POA: Diagnosis not present

## 2024-07-20 DIAGNOSIS — J449 Chronic obstructive pulmonary disease, unspecified: Secondary | ICD-10-CM | POA: Diagnosis not present

## 2024-07-20 DIAGNOSIS — Z792 Long term (current) use of antibiotics: Secondary | ICD-10-CM | POA: Diagnosis not present

## 2024-07-20 DIAGNOSIS — Z79899 Other long term (current) drug therapy: Secondary | ICD-10-CM | POA: Diagnosis not present

## 2024-07-20 DIAGNOSIS — Z794 Long term (current) use of insulin: Secondary | ICD-10-CM | POA: Diagnosis not present

## 2024-07-20 DIAGNOSIS — Z79890 Hormone replacement therapy (postmenopausal): Secondary | ICD-10-CM | POA: Diagnosis not present

## 2024-07-20 DIAGNOSIS — Z87891 Personal history of nicotine dependence: Secondary | ICD-10-CM | POA: Diagnosis not present

## 2024-07-21 DIAGNOSIS — R6510 Systemic inflammatory response syndrome (SIRS) of non-infectious origin without acute organ dysfunction: Secondary | ICD-10-CM | POA: Insufficient documentation

## 2024-07-29 DIAGNOSIS — J441 Chronic obstructive pulmonary disease with (acute) exacerbation: Secondary | ICD-10-CM | POA: Diagnosis not present

## 2024-07-31 DIAGNOSIS — R079 Chest pain, unspecified: Secondary | ICD-10-CM | POA: Diagnosis not present

## 2024-07-31 DIAGNOSIS — E1165 Type 2 diabetes mellitus with hyperglycemia: Secondary | ICD-10-CM | POA: Diagnosis not present

## 2024-07-31 DIAGNOSIS — Z7902 Long term (current) use of antithrombotics/antiplatelets: Secondary | ICD-10-CM | POA: Diagnosis not present

## 2024-07-31 DIAGNOSIS — E785 Hyperlipidemia, unspecified: Secondary | ICD-10-CM | POA: Diagnosis not present

## 2024-07-31 DIAGNOSIS — I252 Old myocardial infarction: Secondary | ICD-10-CM | POA: Diagnosis not present

## 2024-07-31 DIAGNOSIS — Z881 Allergy status to other antibiotic agents status: Secondary | ICD-10-CM | POA: Diagnosis not present

## 2024-07-31 DIAGNOSIS — Z794 Long term (current) use of insulin: Secondary | ICD-10-CM | POA: Diagnosis not present

## 2024-07-31 DIAGNOSIS — Z79899 Other long term (current) drug therapy: Secondary | ICD-10-CM | POA: Diagnosis not present

## 2024-07-31 DIAGNOSIS — Z7951 Long term (current) use of inhaled steroids: Secondary | ICD-10-CM | POA: Diagnosis not present

## 2024-07-31 DIAGNOSIS — J449 Chronic obstructive pulmonary disease, unspecified: Secondary | ICD-10-CM | POA: Diagnosis not present

## 2024-07-31 DIAGNOSIS — E039 Hypothyroidism, unspecified: Secondary | ICD-10-CM | POA: Diagnosis not present

## 2024-07-31 DIAGNOSIS — Z87891 Personal history of nicotine dependence: Secondary | ICD-10-CM | POA: Diagnosis not present

## 2024-07-31 DIAGNOSIS — Z7901 Long term (current) use of anticoagulants: Secondary | ICD-10-CM | POA: Diagnosis not present

## 2024-07-31 DIAGNOSIS — E079 Disorder of thyroid, unspecified: Secondary | ICD-10-CM | POA: Diagnosis not present

## 2024-07-31 DIAGNOSIS — R918 Other nonspecific abnormal finding of lung field: Secondary | ICD-10-CM | POA: Diagnosis not present

## 2024-07-31 DIAGNOSIS — Z888 Allergy status to other drugs, medicaments and biological substances status: Secondary | ICD-10-CM | POA: Diagnosis not present

## 2024-07-31 DIAGNOSIS — J440 Chronic obstructive pulmonary disease with acute lower respiratory infection: Secondary | ICD-10-CM | POA: Diagnosis not present

## 2024-07-31 DIAGNOSIS — I2510 Atherosclerotic heart disease of native coronary artery without angina pectoris: Secondary | ICD-10-CM | POA: Diagnosis not present

## 2024-07-31 DIAGNOSIS — R531 Weakness: Secondary | ICD-10-CM | POA: Diagnosis not present

## 2024-07-31 DIAGNOSIS — Z79890 Hormone replacement therapy (postmenopausal): Secondary | ICD-10-CM | POA: Diagnosis not present

## 2024-07-31 DIAGNOSIS — Z792 Long term (current) use of antibiotics: Secondary | ICD-10-CM | POA: Diagnosis not present

## 2024-07-31 DIAGNOSIS — Z7982 Long term (current) use of aspirin: Secondary | ICD-10-CM | POA: Diagnosis not present

## 2024-07-31 DIAGNOSIS — N3281 Overactive bladder: Secondary | ICD-10-CM | POA: Diagnosis not present

## 2024-07-31 DIAGNOSIS — J90 Pleural effusion, not elsewhere classified: Secondary | ICD-10-CM | POA: Diagnosis not present

## 2024-07-31 DIAGNOSIS — J189 Pneumonia, unspecified organism: Secondary | ICD-10-CM | POA: Diagnosis not present

## 2024-07-31 DIAGNOSIS — Z9981 Dependence on supplemental oxygen: Secondary | ICD-10-CM | POA: Diagnosis not present

## 2024-07-31 DIAGNOSIS — R0902 Hypoxemia: Secondary | ICD-10-CM | POA: Diagnosis not present

## 2024-07-31 DIAGNOSIS — R000 Tachycardia, unspecified: Secondary | ICD-10-CM | POA: Diagnosis not present

## 2024-07-31 DIAGNOSIS — Z66 Do not resuscitate: Secondary | ICD-10-CM | POA: Diagnosis not present

## 2024-07-31 DIAGNOSIS — I10 Essential (primary) hypertension: Secondary | ICD-10-CM | POA: Diagnosis not present

## 2024-07-31 DIAGNOSIS — Z955 Presence of coronary angioplasty implant and graft: Secondary | ICD-10-CM | POA: Diagnosis not present

## 2024-07-31 DIAGNOSIS — J1569 Pneumonia due to other gram-negative bacteria: Secondary | ICD-10-CM | POA: Diagnosis not present

## 2024-07-31 DIAGNOSIS — E119 Type 2 diabetes mellitus without complications: Secondary | ICD-10-CM | POA: Diagnosis not present

## 2024-07-31 DIAGNOSIS — R54 Age-related physical debility: Secondary | ICD-10-CM | POA: Diagnosis not present

## 2024-07-31 DIAGNOSIS — A419 Sepsis, unspecified organism: Secondary | ICD-10-CM | POA: Diagnosis not present

## 2024-08-07 DIAGNOSIS — M6281 Muscle weakness (generalized): Secondary | ICD-10-CM | POA: Diagnosis not present

## 2024-08-07 DIAGNOSIS — I10 Essential (primary) hypertension: Secondary | ICD-10-CM | POA: Diagnosis not present

## 2024-08-07 DIAGNOSIS — G894 Chronic pain syndrome: Secondary | ICD-10-CM | POA: Diagnosis not present

## 2024-08-07 DIAGNOSIS — Z8701 Personal history of pneumonia (recurrent): Secondary | ICD-10-CM | POA: Diagnosis not present

## 2024-08-07 DIAGNOSIS — J449 Chronic obstructive pulmonary disease, unspecified: Secondary | ICD-10-CM | POA: Diagnosis not present

## 2024-08-07 DIAGNOSIS — Z794 Long term (current) use of insulin: Secondary | ICD-10-CM | POA: Diagnosis not present

## 2024-08-07 DIAGNOSIS — I2510 Atherosclerotic heart disease of native coronary artery without angina pectoris: Secondary | ICD-10-CM | POA: Diagnosis not present

## 2024-08-07 DIAGNOSIS — Z515 Encounter for palliative care: Secondary | ICD-10-CM | POA: Diagnosis not present

## 2024-08-11 DIAGNOSIS — M542 Cervicalgia: Secondary | ICD-10-CM | POA: Diagnosis not present

## 2024-08-11 DIAGNOSIS — N390 Urinary tract infection, site not specified: Secondary | ICD-10-CM | POA: Diagnosis not present

## 2024-08-11 DIAGNOSIS — I252 Old myocardial infarction: Secondary | ICD-10-CM | POA: Diagnosis not present

## 2024-08-11 DIAGNOSIS — Z20822 Contact with and (suspected) exposure to covid-19: Secondary | ICD-10-CM | POA: Diagnosis not present

## 2024-08-11 DIAGNOSIS — Z7982 Long term (current) use of aspirin: Secondary | ICD-10-CM | POA: Diagnosis not present

## 2024-08-11 DIAGNOSIS — I2510 Atherosclerotic heart disease of native coronary artery without angina pectoris: Secondary | ICD-10-CM | POA: Diagnosis not present

## 2024-08-11 DIAGNOSIS — R2681 Unsteadiness on feet: Secondary | ICD-10-CM | POA: Diagnosis not present

## 2024-08-11 DIAGNOSIS — Z79899 Other long term (current) drug therapy: Secondary | ICD-10-CM | POA: Diagnosis not present

## 2024-08-11 DIAGNOSIS — E039 Hypothyroidism, unspecified: Secondary | ICD-10-CM | POA: Diagnosis not present

## 2024-08-11 DIAGNOSIS — M6281 Muscle weakness (generalized): Secondary | ICD-10-CM | POA: Diagnosis not present

## 2024-08-11 DIAGNOSIS — Z7409 Other reduced mobility: Secondary | ICD-10-CM | POA: Diagnosis not present

## 2024-08-11 DIAGNOSIS — J449 Chronic obstructive pulmonary disease, unspecified: Secondary | ICD-10-CM | POA: Diagnosis not present

## 2024-08-11 DIAGNOSIS — R7881 Bacteremia: Secondary | ICD-10-CM | POA: Diagnosis not present

## 2024-08-11 DIAGNOSIS — R4182 Altered mental status, unspecified: Secondary | ICD-10-CM | POA: Diagnosis not present

## 2024-08-11 DIAGNOSIS — D72829 Elevated white blood cell count, unspecified: Secondary | ICD-10-CM | POA: Diagnosis not present

## 2024-08-11 DIAGNOSIS — R29810 Facial weakness: Secondary | ICD-10-CM | POA: Diagnosis not present

## 2024-08-11 DIAGNOSIS — Z9181 History of falling: Secondary | ICD-10-CM | POA: Diagnosis not present

## 2024-08-11 DIAGNOSIS — J9611 Chronic respiratory failure with hypoxia: Secondary | ICD-10-CM | POA: Diagnosis not present

## 2024-08-11 DIAGNOSIS — I3481 Nonrheumatic mitral (valve) annulus calcification: Secondary | ICD-10-CM | POA: Diagnosis not present

## 2024-08-11 DIAGNOSIS — R531 Weakness: Secondary | ICD-10-CM | POA: Diagnosis not present

## 2024-08-11 DIAGNOSIS — J90 Pleural effusion, not elsewhere classified: Secondary | ICD-10-CM | POA: Diagnosis not present

## 2024-08-11 DIAGNOSIS — R4781 Slurred speech: Secondary | ICD-10-CM | POA: Diagnosis not present

## 2024-08-11 DIAGNOSIS — R9431 Abnormal electrocardiogram [ECG] [EKG]: Secondary | ICD-10-CM | POA: Diagnosis not present

## 2024-08-11 DIAGNOSIS — E119 Type 2 diabetes mellitus without complications: Secondary | ICD-10-CM | POA: Diagnosis not present

## 2024-08-11 DIAGNOSIS — R54 Age-related physical debility: Secondary | ICD-10-CM | POA: Diagnosis not present

## 2024-08-11 DIAGNOSIS — R404 Transient alteration of awareness: Secondary | ICD-10-CM | POA: Diagnosis not present

## 2024-08-11 DIAGNOSIS — J9811 Atelectasis: Secondary | ICD-10-CM | POA: Diagnosis not present

## 2024-08-11 DIAGNOSIS — N1339 Other hydronephrosis: Secondary | ICD-10-CM | POA: Diagnosis not present

## 2024-08-11 DIAGNOSIS — J441 Chronic obstructive pulmonary disease with (acute) exacerbation: Secondary | ICD-10-CM | POA: Diagnosis not present

## 2024-08-11 DIAGNOSIS — N281 Cyst of kidney, acquired: Secondary | ICD-10-CM | POA: Diagnosis not present

## 2024-08-11 DIAGNOSIS — R918 Other nonspecific abnormal finding of lung field: Secondary | ICD-10-CM | POA: Diagnosis not present

## 2024-08-11 DIAGNOSIS — N1330 Unspecified hydronephrosis: Secondary | ICD-10-CM | POA: Diagnosis not present

## 2024-08-11 DIAGNOSIS — R410 Disorientation, unspecified: Secondary | ICD-10-CM | POA: Diagnosis not present

## 2024-08-11 DIAGNOSIS — R9389 Abnormal findings on diagnostic imaging of other specified body structures: Secondary | ICD-10-CM | POA: Diagnosis not present

## 2024-08-11 DIAGNOSIS — E785 Hyperlipidemia, unspecified: Secondary | ICD-10-CM | POA: Diagnosis not present

## 2024-08-11 DIAGNOSIS — I10 Essential (primary) hypertension: Secondary | ICD-10-CM | POA: Diagnosis not present

## 2024-08-11 DIAGNOSIS — Z9981 Dependence on supplemental oxygen: Secondary | ICD-10-CM | POA: Diagnosis not present

## 2024-08-11 DIAGNOSIS — E1165 Type 2 diabetes mellitus with hyperglycemia: Secondary | ICD-10-CM | POA: Diagnosis not present

## 2024-08-12 DIAGNOSIS — I639 Cerebral infarction, unspecified: Secondary | ICD-10-CM | POA: Diagnosis not present

## 2024-08-12 DIAGNOSIS — R29810 Facial weakness: Secondary | ICD-10-CM | POA: Diagnosis not present

## 2024-08-12 DIAGNOSIS — G459 Transient cerebral ischemic attack, unspecified: Secondary | ICD-10-CM | POA: Diagnosis not present

## 2024-08-12 DIAGNOSIS — R9389 Abnormal findings on diagnostic imaging of other specified body structures: Secondary | ICD-10-CM | POA: Diagnosis not present

## 2024-08-12 DIAGNOSIS — D72829 Elevated white blood cell count, unspecified: Secondary | ICD-10-CM | POA: Diagnosis not present

## 2024-08-12 DIAGNOSIS — J449 Chronic obstructive pulmonary disease, unspecified: Secondary | ICD-10-CM | POA: Diagnosis not present

## 2024-08-12 DIAGNOSIS — R531 Weakness: Secondary | ICD-10-CM | POA: Diagnosis not present

## 2024-08-12 DIAGNOSIS — E1142 Type 2 diabetes mellitus with diabetic polyneuropathy: Secondary | ICD-10-CM | POA: Diagnosis not present

## 2024-08-12 DIAGNOSIS — N281 Cyst of kidney, acquired: Secondary | ICD-10-CM | POA: Diagnosis not present

## 2024-08-12 DIAGNOSIS — I2510 Atherosclerotic heart disease of native coronary artery without angina pectoris: Secondary | ICD-10-CM | POA: Diagnosis not present

## 2024-08-12 DIAGNOSIS — Z79899 Other long term (current) drug therapy: Secondary | ICD-10-CM | POA: Diagnosis not present

## 2024-08-12 DIAGNOSIS — Z7902 Long term (current) use of antithrombotics/antiplatelets: Secondary | ICD-10-CM | POA: Diagnosis not present

## 2024-08-12 DIAGNOSIS — E1165 Type 2 diabetes mellitus with hyperglycemia: Secondary | ICD-10-CM | POA: Diagnosis not present

## 2024-08-12 DIAGNOSIS — I10 Essential (primary) hypertension: Secondary | ICD-10-CM | POA: Diagnosis not present

## 2024-08-12 DIAGNOSIS — R4781 Slurred speech: Secondary | ICD-10-CM | POA: Diagnosis not present

## 2024-08-12 DIAGNOSIS — N1339 Other hydronephrosis: Secondary | ICD-10-CM | POA: Diagnosis not present

## 2024-08-12 DIAGNOSIS — Z7982 Long term (current) use of aspirin: Secondary | ICD-10-CM | POA: Diagnosis not present

## 2024-08-13 DIAGNOSIS — N390 Urinary tract infection, site not specified: Secondary | ICD-10-CM | POA: Diagnosis not present

## 2024-08-13 DIAGNOSIS — K219 Gastro-esophageal reflux disease without esophagitis: Secondary | ICD-10-CM | POA: Diagnosis not present

## 2024-08-13 DIAGNOSIS — Z9981 Dependence on supplemental oxygen: Secondary | ICD-10-CM | POA: Diagnosis not present

## 2024-08-13 DIAGNOSIS — J9611 Chronic respiratory failure with hypoxia: Secondary | ICD-10-CM | POA: Diagnosis not present

## 2024-08-13 DIAGNOSIS — E46 Unspecified protein-calorie malnutrition: Secondary | ICD-10-CM | POA: Diagnosis not present

## 2024-08-13 DIAGNOSIS — D72829 Elevated white blood cell count, unspecified: Secondary | ICD-10-CM | POA: Diagnosis not present

## 2024-08-13 DIAGNOSIS — R531 Weakness: Secondary | ICD-10-CM | POA: Diagnosis not present

## 2024-08-13 DIAGNOSIS — Z794 Long term (current) use of insulin: Secondary | ICD-10-CM | POA: Diagnosis not present

## 2024-08-13 DIAGNOSIS — J441 Chronic obstructive pulmonary disease with (acute) exacerbation: Secondary | ICD-10-CM | POA: Diagnosis not present

## 2024-08-13 DIAGNOSIS — Z8701 Personal history of pneumonia (recurrent): Secondary | ICD-10-CM | POA: Diagnosis not present

## 2024-08-13 DIAGNOSIS — I10 Essential (primary) hypertension: Secondary | ICD-10-CM | POA: Diagnosis not present

## 2024-08-13 DIAGNOSIS — E119 Type 2 diabetes mellitus without complications: Secondary | ICD-10-CM | POA: Diagnosis not present

## 2024-08-14 ENCOUNTER — Emergency Department (HOSPITAL_COMMUNITY)

## 2024-08-14 ENCOUNTER — Other Ambulatory Visit: Payer: Self-pay

## 2024-08-14 ENCOUNTER — Encounter (HOSPITAL_COMMUNITY): Payer: Self-pay | Admitting: Emergency Medicine

## 2024-08-14 ENCOUNTER — Inpatient Hospital Stay (HOSPITAL_COMMUNITY)
Admission: EM | Admit: 2024-08-14 | Discharge: 2024-08-19 | DRG: 871 | Disposition: A | Discharge status: Home Health | Attending: Family Medicine | Admitting: Family Medicine

## 2024-08-14 DIAGNOSIS — E1165 Type 2 diabetes mellitus with hyperglycemia: Secondary | ICD-10-CM | POA: Diagnosis present

## 2024-08-14 DIAGNOSIS — Z800 Family history of malignant neoplasm of digestive organs: Secondary | ICD-10-CM

## 2024-08-14 DIAGNOSIS — Z7982 Long term (current) use of aspirin: Secondary | ICD-10-CM

## 2024-08-14 DIAGNOSIS — J9611 Chronic respiratory failure with hypoxia: Secondary | ICD-10-CM | POA: Diagnosis present

## 2024-08-14 DIAGNOSIS — I2510 Atherosclerotic heart disease of native coronary artery without angina pectoris: Secondary | ICD-10-CM | POA: Diagnosis present

## 2024-08-14 DIAGNOSIS — Z1152 Encounter for screening for COVID-19: Secondary | ICD-10-CM

## 2024-08-14 DIAGNOSIS — Z7951 Long term (current) use of inhaled steroids: Secondary | ICD-10-CM

## 2024-08-14 DIAGNOSIS — J189 Pneumonia, unspecified organism: Secondary | ICD-10-CM | POA: Diagnosis not present

## 2024-08-14 DIAGNOSIS — R531 Weakness: Secondary | ICD-10-CM | POA: Diagnosis not present

## 2024-08-14 DIAGNOSIS — N179 Acute kidney failure, unspecified: Secondary | ICD-10-CM | POA: Diagnosis present

## 2024-08-14 DIAGNOSIS — Z888 Allergy status to other drugs, medicaments and biological substances status: Secondary | ICD-10-CM

## 2024-08-14 DIAGNOSIS — Z79890 Hormone replacement therapy (postmenopausal): Secondary | ICD-10-CM

## 2024-08-14 DIAGNOSIS — J440 Chronic obstructive pulmonary disease with acute lower respiratory infection: Secondary | ICD-10-CM | POA: Diagnosis present

## 2024-08-14 DIAGNOSIS — Z6823 Body mass index (BMI) 23.0-23.9, adult: Secondary | ICD-10-CM

## 2024-08-14 DIAGNOSIS — Z66 Do not resuscitate: Secondary | ICD-10-CM | POA: Diagnosis not present

## 2024-08-14 DIAGNOSIS — I10 Essential (primary) hypertension: Secondary | ICD-10-CM | POA: Diagnosis present

## 2024-08-14 DIAGNOSIS — E872 Acidosis, unspecified: Secondary | ICD-10-CM | POA: Diagnosis present

## 2024-08-14 DIAGNOSIS — R6520 Severe sepsis without septic shock: Secondary | ICD-10-CM | POA: Diagnosis present

## 2024-08-14 DIAGNOSIS — G9341 Metabolic encephalopathy: Secondary | ICD-10-CM | POA: Diagnosis present

## 2024-08-14 DIAGNOSIS — Z955 Presence of coronary angioplasty implant and graft: Secondary | ICD-10-CM

## 2024-08-14 DIAGNOSIS — E860 Dehydration: Secondary | ICD-10-CM | POA: Diagnosis present

## 2024-08-14 DIAGNOSIS — Z515 Encounter for palliative care: Secondary | ICD-10-CM | POA: Diagnosis not present

## 2024-08-14 DIAGNOSIS — Z8379 Family history of other diseases of the digestive system: Secondary | ICD-10-CM

## 2024-08-14 DIAGNOSIS — K5900 Constipation, unspecified: Secondary | ICD-10-CM | POA: Diagnosis present

## 2024-08-14 DIAGNOSIS — K219 Gastro-esophageal reflux disease without esophagitis: Secondary | ICD-10-CM | POA: Diagnosis present

## 2024-08-14 DIAGNOSIS — A419 Sepsis, unspecified organism: Secondary | ICD-10-CM | POA: Diagnosis not present

## 2024-08-14 DIAGNOSIS — J9811 Atelectasis: Secondary | ICD-10-CM | POA: Diagnosis present

## 2024-08-14 DIAGNOSIS — D638 Anemia in other chronic diseases classified elsewhere: Secondary | ICD-10-CM | POA: Diagnosis present

## 2024-08-14 DIAGNOSIS — Z7902 Long term (current) use of antithrombotics/antiplatelets: Secondary | ICD-10-CM

## 2024-08-14 DIAGNOSIS — Z789 Other specified health status: Secondary | ICD-10-CM | POA: Diagnosis not present

## 2024-08-14 DIAGNOSIS — E039 Hypothyroidism, unspecified: Secondary | ICD-10-CM | POA: Diagnosis present

## 2024-08-14 DIAGNOSIS — J90 Pleural effusion, not elsewhere classified: Secondary | ICD-10-CM | POA: Diagnosis not present

## 2024-08-14 DIAGNOSIS — I252 Old myocardial infarction: Secondary | ICD-10-CM

## 2024-08-14 DIAGNOSIS — Z79899 Other long term (current) drug therapy: Secondary | ICD-10-CM

## 2024-08-14 DIAGNOSIS — N1330 Unspecified hydronephrosis: Secondary | ICD-10-CM | POA: Diagnosis present

## 2024-08-14 DIAGNOSIS — R627 Adult failure to thrive: Secondary | ICD-10-CM | POA: Diagnosis present

## 2024-08-14 DIAGNOSIS — F39 Unspecified mood [affective] disorder: Secondary | ICD-10-CM | POA: Diagnosis present

## 2024-08-14 DIAGNOSIS — F419 Anxiety disorder, unspecified: Secondary | ICD-10-CM | POA: Diagnosis present

## 2024-08-14 DIAGNOSIS — Z87891 Personal history of nicotine dependence: Secondary | ICD-10-CM

## 2024-08-14 DIAGNOSIS — Z8249 Family history of ischemic heart disease and other diseases of the circulatory system: Secondary | ICD-10-CM

## 2024-08-14 DIAGNOSIS — M1612 Unilateral primary osteoarthritis, left hip: Secondary | ICD-10-CM | POA: Diagnosis present

## 2024-08-14 DIAGNOSIS — J449 Chronic obstructive pulmonary disease, unspecified: Secondary | ICD-10-CM | POA: Diagnosis not present

## 2024-08-14 DIAGNOSIS — G8929 Other chronic pain: Secondary | ICD-10-CM | POA: Diagnosis present

## 2024-08-14 DIAGNOSIS — Z794 Long term (current) use of insulin: Secondary | ICD-10-CM

## 2024-08-14 DIAGNOSIS — Z7189 Other specified counseling: Secondary | ICD-10-CM | POA: Diagnosis not present

## 2024-08-14 DIAGNOSIS — E1140 Type 2 diabetes mellitus with diabetic neuropathy, unspecified: Secondary | ICD-10-CM | POA: Diagnosis present

## 2024-08-14 DIAGNOSIS — E7800 Pure hypercholesterolemia, unspecified: Secondary | ICD-10-CM | POA: Diagnosis present

## 2024-08-14 LAB — CBC WITH DIFFERENTIAL/PLATELET
Abs Immature Granulocytes: 0.16 K/uL — ABNORMAL HIGH (ref 0.00–0.07)
Basophils Absolute: 0.1 K/uL (ref 0.0–0.1)
Basophils Relative: 0 %
Eosinophils Absolute: 0.1 K/uL (ref 0.0–0.5)
Eosinophils Relative: 1 %
HCT: 33.6 % — ABNORMAL LOW (ref 36.0–46.0)
Hemoglobin: 11.3 g/dL — ABNORMAL LOW (ref 12.0–15.0)
Immature Granulocytes: 1 %
Lymphocytes Relative: 6 %
Lymphs Abs: 1.2 K/uL (ref 0.7–4.0)
MCH: 29.5 pg (ref 26.0–34.0)
MCHC: 33.6 g/dL (ref 30.0–36.0)
MCV: 87.7 fL (ref 80.0–100.0)
Monocytes Absolute: 0.9 K/uL (ref 0.1–1.0)
Monocytes Relative: 5 %
Neutro Abs: 17.3 K/uL — ABNORMAL HIGH (ref 1.7–7.7)
Neutrophils Relative %: 87 %
Platelets: 247 K/uL (ref 150–400)
RBC: 3.83 MIL/uL — ABNORMAL LOW (ref 3.87–5.11)
RDW: 15.7 % — ABNORMAL HIGH (ref 11.5–15.5)
WBC: 19.7 K/uL — ABNORMAL HIGH (ref 4.0–10.5)
nRBC: 0 % (ref 0.0–0.2)

## 2024-08-14 LAB — COMPREHENSIVE METABOLIC PANEL WITH GFR
ALT: 11 U/L (ref 0–44)
AST: 18 U/L (ref 15–41)
Albumin: 3.3 g/dL — ABNORMAL LOW (ref 3.5–5.0)
Alkaline Phosphatase: 53 U/L (ref 38–126)
Anion gap: 12 (ref 5–15)
BUN: 17 mg/dL (ref 8–23)
CO2: 21 mmol/L — ABNORMAL LOW (ref 22–32)
Calcium: 9.8 mg/dL (ref 8.9–10.3)
Chloride: 100 mmol/L (ref 98–111)
Creatinine, Ser: 1.17 mg/dL — ABNORMAL HIGH (ref 0.44–1.00)
GFR, Estimated: 47 mL/min — ABNORMAL LOW (ref >60)
Glucose, Bld: 183 mg/dL — ABNORMAL HIGH (ref 70–99)
Potassium: 3.7 mmol/L (ref 3.5–5.1)
Sodium: 133 mmol/L — ABNORMAL LOW (ref 135–145)
Total Bilirubin: 0.8 mg/dL (ref 0.0–1.2)
Total Protein: 7.4 g/dL (ref 6.5–8.1)

## 2024-08-14 LAB — PROTIME-INR
INR: 0.9 (ref 0.8–1.2)
Prothrombin Time: 12.9 s (ref 11.4–15.2)

## 2024-08-14 LAB — LACTIC ACID, PLASMA: Lactic Acid, Venous: 2.7 mmol/L (ref 0.5–1.9)

## 2024-08-14 MED ORDER — VANCOMYCIN HCL IN DEXTROSE 1-5 GM/200ML-% IV SOLN
1000.0000 mg | Freq: Once | INTRAVENOUS | Status: AC
Start: 2024-08-14 — End: 2024-08-15
  Administered 2024-08-15: 1000 mg via INTRAVENOUS
  Filled 2024-08-14: qty 200

## 2024-08-14 MED ORDER — LACTATED RINGERS IV BOLUS (SEPSIS)
1000.0000 mL | Freq: Once | INTRAVENOUS | Status: AC
Start: 2024-08-14 — End: 2024-08-15
  Administered 2024-08-14: 1000 mL via INTRAVENOUS

## 2024-08-14 MED ORDER — LACTATED RINGERS IV BOLUS (SEPSIS)
250.0000 mL | Freq: Once | INTRAVENOUS | Status: AC
  Administered 2024-08-15: 250 mL via INTRAVENOUS

## 2024-08-14 MED ORDER — LACTATED RINGERS IV BOLUS (SEPSIS)
1000.0000 mL | Freq: Once | INTRAVENOUS | Status: AC
  Administered 2024-08-14: 1000 mL via INTRAVENOUS

## 2024-08-14 MED ORDER — METRONIDAZOLE 500 MG/100ML IV SOLN
500.0000 mg | Freq: Once | INTRAVENOUS | Status: AC
Start: 2024-08-14 — End: 2024-08-15
  Administered 2024-08-14: 500 mg via INTRAVENOUS
  Filled 2024-08-14: qty 100

## 2024-08-14 MED ORDER — LACTATED RINGERS IV SOLN: INTRAVENOUS | Status: DC

## 2024-08-14 MED ORDER — CEFEPIME HCL 2 G IV SOLR
2.0000 g | Freq: Once | INTRAVENOUS | Status: AC
  Administered 2024-08-14: 2 g via INTRAVENOUS
  Filled 2024-08-14: qty 12.5

## 2024-08-14 NOTE — ED Triage Notes (Signed)
 Pt brought in by family with increased weakness after being discharged yesterday from [facility] with diagnosis of Pneumonia.Pt wears O2 @ 2L [place] at all times. Family reports CBG of 236 at home. Family states pt like a dishrag and needed staff to help family get pt out of car.

## 2024-08-14 NOTE — ED Notes (Signed)
 X-ray at bedside.

## 2024-08-15 ENCOUNTER — Inpatient Hospital Stay (HOSPITAL_COMMUNITY)

## 2024-08-15 DIAGNOSIS — G9341 Metabolic encephalopathy: Secondary | ICD-10-CM | POA: Diagnosis not present

## 2024-08-15 DIAGNOSIS — A419 Sepsis, unspecified organism: Secondary | ICD-10-CM | POA: Diagnosis not present

## 2024-08-15 DIAGNOSIS — E039 Hypothyroidism, unspecified: Secondary | ICD-10-CM | POA: Diagnosis not present

## 2024-08-15 DIAGNOSIS — Z7189 Other specified counseling: Secondary | ICD-10-CM | POA: Diagnosis not present

## 2024-08-15 DIAGNOSIS — J90 Pleural effusion, not elsewhere classified: Secondary | ICD-10-CM | POA: Diagnosis not present

## 2024-08-15 DIAGNOSIS — M160 Bilateral primary osteoarthritis of hip: Secondary | ICD-10-CM | POA: Diagnosis not present

## 2024-08-15 DIAGNOSIS — Z794 Long term (current) use of insulin: Secondary | ICD-10-CM | POA: Diagnosis not present

## 2024-08-15 DIAGNOSIS — D638 Anemia in other chronic diseases classified elsewhere: Secondary | ICD-10-CM | POA: Diagnosis not present

## 2024-08-15 DIAGNOSIS — J440 Chronic obstructive pulmonary disease with acute lower respiratory infection: Secondary | ICD-10-CM | POA: Diagnosis not present

## 2024-08-15 DIAGNOSIS — J9811 Atelectasis: Secondary | ICD-10-CM | POA: Diagnosis not present

## 2024-08-15 DIAGNOSIS — I10 Essential (primary) hypertension: Secondary | ICD-10-CM | POA: Diagnosis not present

## 2024-08-15 DIAGNOSIS — R531 Weakness: Secondary | ICD-10-CM | POA: Diagnosis not present

## 2024-08-15 DIAGNOSIS — F419 Anxiety disorder, unspecified: Secondary | ICD-10-CM | POA: Diagnosis not present

## 2024-08-15 DIAGNOSIS — G8929 Other chronic pain: Secondary | ICD-10-CM | POA: Diagnosis not present

## 2024-08-15 DIAGNOSIS — J449 Chronic obstructive pulmonary disease, unspecified: Secondary | ICD-10-CM | POA: Diagnosis not present

## 2024-08-15 DIAGNOSIS — F39 Unspecified mood [affective] disorder: Secondary | ICD-10-CM | POA: Diagnosis not present

## 2024-08-15 DIAGNOSIS — J189 Pneumonia, unspecified organism: Secondary | ICD-10-CM | POA: Diagnosis not present

## 2024-08-15 DIAGNOSIS — E872 Acidosis, unspecified: Secondary | ICD-10-CM | POA: Diagnosis not present

## 2024-08-15 DIAGNOSIS — N2889 Other specified disorders of kidney and ureter: Secondary | ICD-10-CM | POA: Diagnosis not present

## 2024-08-15 DIAGNOSIS — N281 Cyst of kidney, acquired: Secondary | ICD-10-CM | POA: Diagnosis not present

## 2024-08-15 DIAGNOSIS — M25552 Pain in left hip: Secondary | ICD-10-CM | POA: Diagnosis not present

## 2024-08-15 DIAGNOSIS — N1330 Unspecified hydronephrosis: Secondary | ICD-10-CM | POA: Diagnosis not present

## 2024-08-15 DIAGNOSIS — Z515 Encounter for palliative care: Secondary | ICD-10-CM | POA: Diagnosis not present

## 2024-08-15 DIAGNOSIS — J9611 Chronic respiratory failure with hypoxia: Secondary | ICD-10-CM | POA: Diagnosis not present

## 2024-08-15 DIAGNOSIS — R627 Adult failure to thrive: Secondary | ICD-10-CM | POA: Diagnosis not present

## 2024-08-15 DIAGNOSIS — E7800 Pure hypercholesterolemia, unspecified: Secondary | ICD-10-CM | POA: Diagnosis not present

## 2024-08-15 DIAGNOSIS — Z1152 Encounter for screening for COVID-19: Secondary | ICD-10-CM | POA: Diagnosis not present

## 2024-08-15 DIAGNOSIS — Z66 Do not resuscitate: Secondary | ICD-10-CM | POA: Diagnosis not present

## 2024-08-15 DIAGNOSIS — E1165 Type 2 diabetes mellitus with hyperglycemia: Secondary | ICD-10-CM | POA: Diagnosis not present

## 2024-08-15 DIAGNOSIS — E860 Dehydration: Secondary | ICD-10-CM | POA: Diagnosis not present

## 2024-08-15 DIAGNOSIS — E1140 Type 2 diabetes mellitus with diabetic neuropathy, unspecified: Secondary | ICD-10-CM | POA: Diagnosis not present

## 2024-08-15 DIAGNOSIS — R6520 Severe sepsis without septic shock: Secondary | ICD-10-CM | POA: Diagnosis not present

## 2024-08-15 DIAGNOSIS — Z789 Other specified health status: Secondary | ICD-10-CM | POA: Diagnosis not present

## 2024-08-15 DIAGNOSIS — N179 Acute kidney failure, unspecified: Secondary | ICD-10-CM | POA: Diagnosis not present

## 2024-08-15 LAB — URINALYSIS, W/ REFLEX TO CULTURE (INFECTION SUSPECTED)
Bilirubin Urine: NEGATIVE
Glucose, UA: NEGATIVE mg/dL
Hgb urine dipstick: NEGATIVE
Ketones, ur: NEGATIVE mg/dL
Leukocytes,Ua: NEGATIVE
Nitrite: NEGATIVE
Protein, ur: NEGATIVE mg/dL
Specific Gravity, Urine: 1.003 — ABNORMAL LOW (ref 1.005–1.030)
pH: 6 (ref 5.0–8.0)

## 2024-08-15 LAB — RESPIRATORY PANEL BY PCR

## 2024-08-15 LAB — RESP PANEL BY RT-PCR (RSV, FLU A&B, COVID)  RVPGX2
Influenza A by PCR: NEGATIVE
Influenza B by PCR: NEGATIVE
Resp Syncytial Virus by PCR: NEGATIVE
SARS Coronavirus 2 by RT PCR: NEGATIVE

## 2024-08-15 LAB — GLUCOSE, CAPILLARY
Glucose-Capillary: 178 mg/dL — ABNORMAL HIGH (ref 70–99)
Glucose-Capillary: 296 mg/dL — ABNORMAL HIGH (ref 70–99)
Glucose-Capillary: 351 mg/dL — ABNORMAL HIGH (ref 70–99)

## 2024-08-15 LAB — CBG MONITORING, ED: Glucose-Capillary: 251 mg/dL — ABNORMAL HIGH (ref 70–99)

## 2024-08-15 LAB — PROCALCITONIN: Procalcitonin: 0.98 ng/mL

## 2024-08-15 LAB — LACTIC ACID, PLASMA: Lactic Acid, Venous: 1.9 mmol/L (ref 0.5–1.9)

## 2024-08-15 MED ORDER — ACETAMINOPHEN 325 MG PO TABS
650.0000 mg | ORAL_TABLET | Freq: Four times a day (QID) | ORAL | Status: DC | PRN
  Administered 2024-08-16 – 2024-08-19 (×5): 650 mg via ORAL
  Filled 2024-08-15 (×5): qty 2

## 2024-08-15 MED ORDER — ASPIRIN 81 MG PO TBEC
81.0000 mg | DELAYED_RELEASE_TABLET | Freq: Every day | ORAL | Status: DC
  Administered 2024-08-15 – 2024-08-19 (×5): 81 mg via ORAL
  Filled 2024-08-15 (×5): qty 1

## 2024-08-15 MED ORDER — IOHEXOL 300 MG/ML  SOLN
100.0000 mL | Freq: Once | INTRAMUSCULAR | Status: AC | PRN
Start: 2024-08-15 — End: 2024-08-15
  Administered 2024-08-15: 100 mL via INTRAVENOUS

## 2024-08-15 MED ORDER — METOPROLOL SUCCINATE ER 25 MG PO TB24
25.0000 mg | ORAL_TABLET | Freq: Every evening | ORAL | Status: DC
  Administered 2024-08-15 – 2024-08-19 (×5): 25 mg via ORAL
  Filled 2024-08-15 (×5): qty 1

## 2024-08-15 MED ORDER — INSULIN ASPART 100 UNIT/ML IJ SOLN
0.0000 [IU] | Freq: Three times a day (TID) | INTRAMUSCULAR | Status: DC
  Administered 2024-08-15: 2 [IU] via SUBCUTANEOUS
  Administered 2024-08-15 (×2): 5 [IU] via SUBCUTANEOUS
  Filled 2024-08-15: qty 1

## 2024-08-15 MED ORDER — ATORVASTATIN CALCIUM 40 MG PO TABS
80.0000 mg | ORAL_TABLET | Freq: Every evening | ORAL | Status: DC
  Administered 2024-08-15 – 2024-08-19 (×5): 80 mg via ORAL
  Filled 2024-08-15 (×5): qty 2

## 2024-08-15 MED ORDER — SODIUM CHLORIDE 0.9 % IV SOLN
2.0000 g | Freq: Two times a day (BID) | INTRAVENOUS | Status: DC
  Administered 2024-08-15 – 2024-08-19 (×9): 2 g via INTRAVENOUS
  Filled 2024-08-15 (×9): qty 12.5

## 2024-08-15 MED ORDER — IPRATROPIUM-ALBUTEROL 0.5-2.5 (3) MG/3ML IN SOLN
3.0000 mL | Freq: Four times a day (QID) | RESPIRATORY_TRACT | Status: DC
  Administered 2024-08-15 (×3): 3 mL via RESPIRATORY_TRACT
  Filled 2024-08-15 (×2): qty 3

## 2024-08-15 MED ORDER — INSULIN GLARGINE 100 UNIT/ML ~~LOC~~ SOLN
26.0000 [IU] | Freq: Every day | SUBCUTANEOUS | Status: DC
  Administered 2024-08-15 – 2024-08-17 (×3): 26 [IU] via SUBCUTANEOUS
  Filled 2024-08-15 (×4): qty 0.26

## 2024-08-15 MED ORDER — LEVOTHYROXINE SODIUM 137 MCG PO TABS
137.0000 ug | ORAL_TABLET | Freq: Every day | ORAL | Status: DC
  Administered 2024-08-15 – 2024-08-19 (×5): 137 ug via ORAL
  Filled 2024-08-15 (×5): qty 1

## 2024-08-15 MED ORDER — INSULIN ASPART 100 UNIT/ML IJ SOLN
0.0000 [IU] | Freq: Three times a day (TID) | INTRAMUSCULAR | Status: DC
  Administered 2024-08-16: 3 [IU] via SUBCUTANEOUS
  Administered 2024-08-16: 11 [IU] via SUBCUTANEOUS
  Administered 2024-08-16: 8 [IU] via SUBCUTANEOUS
  Administered 2024-08-17: 5 [IU] via SUBCUTANEOUS
  Administered 2024-08-17: 15 [IU] via SUBCUTANEOUS
  Administered 2024-08-17: 5 [IU] via SUBCUTANEOUS
  Administered 2024-08-18: 8 [IU] via SUBCUTANEOUS
  Administered 2024-08-18: 11 [IU] via SUBCUTANEOUS
  Administered 2024-08-18 – 2024-08-19 (×3): 8 [IU] via SUBCUTANEOUS

## 2024-08-15 MED ORDER — ALPRAZOLAM 0.25 MG PO TABS
0.2500 mg | ORAL_TABLET | Freq: Every evening | ORAL | Status: DC | PRN
  Administered 2024-08-15 – 2024-08-17 (×3): 0.25 mg via ORAL
  Filled 2024-08-15 (×3): qty 1

## 2024-08-15 MED ORDER — POLYETHYLENE GLYCOL 3350 17 G PO PACK
17.0000 g | PACK | Freq: Every day | ORAL | Status: DC
  Administered 2024-08-15 – 2024-08-19 (×4): 17 g via ORAL
  Filled 2024-08-15 (×4): qty 1

## 2024-08-15 MED ORDER — LACTATED RINGERS IV SOLN: INTRAVENOUS | Status: DC

## 2024-08-15 MED ORDER — TICAGRELOR 90 MG PO TABS
90.0000 mg | ORAL_TABLET | Freq: Two times a day (BID) | ORAL | Status: DC
  Administered 2024-08-15 – 2024-08-19 (×9): 90 mg via ORAL
  Filled 2024-08-15 (×10): qty 1

## 2024-08-15 MED ORDER — GABAPENTIN 100 MG PO CAPS
100.0000 mg | ORAL_CAPSULE | Freq: Three times a day (TID) | ORAL | Status: DC
  Administered 2024-08-15 – 2024-08-19 (×13): 100 mg via ORAL
  Filled 2024-08-15 (×13): qty 1

## 2024-08-15 MED ORDER — HYDROCODONE-ACETAMINOPHEN 5-325 MG PO TABS
1.0000 | ORAL_TABLET | Freq: Four times a day (QID) | ORAL | Status: DC | PRN
  Administered 2024-08-15 – 2024-08-17 (×4): 1 via ORAL
  Filled 2024-08-15 (×5): qty 1

## 2024-08-15 MED ORDER — PANTOPRAZOLE SODIUM 40 MG PO TBEC
40.0000 mg | DELAYED_RELEASE_TABLET | Freq: Every day | ORAL | Status: DC
  Administered 2024-08-15 – 2024-08-19 (×5): 40 mg via ORAL
  Filled 2024-08-15 (×5): qty 1

## 2024-08-15 MED ORDER — INSULIN ASPART 100 UNIT/ML IJ SOLN
8.0000 [IU] | Freq: Three times a day (TID) | INTRAMUSCULAR | Status: DC
Start: 2024-08-15 — End: 2024-08-17
  Administered 2024-08-15 – 2024-08-17 (×5): 8 [IU] via SUBCUTANEOUS

## 2024-08-15 MED ORDER — VANCOMYCIN HCL 750 MG/150ML IV SOLN
750.0000 mg | INTRAVENOUS | Status: DC
  Administered 2024-08-15: 750 mg via INTRAVENOUS
  Filled 2024-08-15 (×2): qty 150

## 2024-08-15 MED ORDER — METRONIDAZOLE 500 MG/100ML IV SOLN
500.0000 mg | Freq: Two times a day (BID) | INTRAVENOUS | Status: DC
Start: 2024-08-15 — End: 2024-08-15
  Administered 2024-08-15: 500 mg via INTRAVENOUS
  Filled 2024-08-15: qty 100

## 2024-08-15 MED ORDER — INSULIN GLARGINE 100 UNIT/ML ~~LOC~~ SOLN
50.0000 [IU] | Freq: Every day | SUBCUTANEOUS | Status: DC
Start: 2024-08-15 — End: 2024-08-15
  Filled 2024-08-15: qty 0.5

## 2024-08-15 MED ORDER — SODIUM CHLORIDE 0.9% FLUSH
3.0000 mL | Freq: Two times a day (BID) | INTRAVENOUS | Status: DC
Start: 2024-08-15 — End: 2024-08-19
  Administered 2024-08-15 – 2024-08-19 (×7): 3 mL via INTRAVENOUS

## 2024-08-15 MED ORDER — INSULIN ASPART 100 UNIT/ML IJ SOLN
0.0000 [IU] | Freq: Every day | INTRAMUSCULAR | Status: DC
  Administered 2024-08-15: 5 [IU] via SUBCUTANEOUS
  Administered 2024-08-17: 3 [IU] via SUBCUTANEOUS
  Administered 2024-08-18: 4 [IU] via SUBCUTANEOUS

## 2024-08-15 NOTE — Progress Notes (Signed)
   08/15/24 1257  Assess: MEWS Score  Temp 98.6 F (37 C)  BP 131/64  MAP (mmHg) 84  Resp 20  SpO2 97 %  O2 Device Nasal Cannula  O2 Flow Rate (L/min) 3 L/min  Assess: MEWS Score  MEWS Temp 0  MEWS Systolic 0  MEWS Pulse 1  MEWS RR 0  MEWS LOC 1  MEWS Score 2  MEWS Score Color Yellow  Assess: if the MEWS score is Yellow or Red  Were vital signs accurate and taken at a resting state? Yes  Does the patient meet 2 or more of the SIRS criteria? No  MEWS guidelines implemented  Yes, yellow  Treat  MEWS Interventions Considered administering scheduled or prn medications/treatments as ordered  Take Vital Signs  Increase Vital Sign Frequency  Yellow: Q2hr x1, continue Q4hrs until patient remains green for 12hrs  Escalate  MEWS: Escalate Yellow: Discuss with charge nurse and consider notifying provider and/or RRT  Notify: Charge Nurse/RN  Name of Charge Nurse/RN Notified Brandie RN  Provider Notification  Provider Name/Title Briana Shown MD  Date Provider Notified 08/15/24  Time Provider Notified 1300  Method of Notification Page (Secure chat)  Notification Reason Other (Comment) (Yellow MEWs)  Provider response No new orders  Date of Provider Response 08/15/24  Time of Provider Response 1337  Assess: SIRS CRITERIA  SIRS Temperature  0  SIRS Respirations  0  SIRS Pulse 1  SIRS WBC 0  SIRS Score Sum  1

## 2024-08-15 NOTE — Progress Notes (Signed)
   08/15/24 1400  Assess: MEWS Score  Temp 98.3 F (36.8 C)  BP (!) 116/58  MAP (mmHg) 76  Pulse Rate 100  Resp (!) 24  SpO2 96 %  O2 Device Nasal Cannula  O2 Flow Rate (L/min) 3 L/min  Assess: MEWS Score  MEWS Temp 0  MEWS Systolic 0  MEWS Pulse 0  MEWS RR 1  MEWS LOC 0  MEWS Score 1  MEWS Score Color Green  Assess: SIRS CRITERIA  SIRS Temperature  0  SIRS Respirations  1  SIRS Pulse 1  SIRS WBC 0  SIRS Score Sum  2

## 2024-08-15 NOTE — ED Notes (Signed)
 Urine specimen sent to lab

## 2024-08-15 NOTE — Progress Notes (Signed)
 Pharmacy Antibiotic Note  Gabrielle Wood is a 78 y.o. female admitted on 08/14/2024 with generalized weakness/SOB.  Pharmacy has been consulted for vancomycin  dosing for sepsis. Recently discharged from [facility] after treatment for pneumonia  -WBC 19, sCr 1.17 (bl~0.8-0.9), afebrile -CXR no obvious signs of infection  Plan: -Cefepime  2g IV every 12 hours -Flagyl  500mg  IV every 12 hours -Vancomycin  1g IV x1 -Vancomycin  750mg  IV every 24 hours (AUC 420, Vd 072, IBW, sCr 1.17) -F/u CT of abdomen  -Monitor renal function -Follow up signs of clinical improvement, LOT, de-escalation of antibiotics   Height: 5' 7 (170.2 cm) Weight: 69 kg (152 lb 1.9 oz) IBW/kg (Calculated) : 61.6  Temp (24hrs), Avg:99.1 F (37.3 C), Min:99.1 F (37.3 C), Max:99.1 F (37.3 C)  Recent Labs  Lab 08/14/24 2310 08/15/24 0049  WBC 19.7*  --   CREATININE 1.17*  --   LATICACIDVEN 2.7* 1.9    Estimated Creatinine Clearance: 37.9 mL/min (A) (by C-G formula based on SCr of 1.17 mg/dL (H)).    Antimicrobials this admission: Cefepime  9/12 >>  Flagyl  9/12 >>  Vancomycin  9/12 >>  Microbiology results: 9/12 BCx:  9/13 MRSA PCR:   Thank you for allowing pharmacy to be a part of this patient's care.  Lynwood Poplar, PharmD, BCPS Clinical Pharmacist 08/15/2024 6:28 AM

## 2024-08-15 NOTE — Sepsis Progress Note (Signed)
 Following for sepsis monitoring ?

## 2024-08-15 NOTE — Plan of Care (Signed)
   Problem: Skin Integrity: Goal: Risk for impaired skin integrity will decrease Outcome: Progressing   Problem: Education: Goal: Knowledge of General Education information will improve Description: Including pain rating scale, medication(s)/side effects and non-pharmacologic comfort measures Outcome: Progressing   Problem: Clinical Measurements: Goal: Ability to maintain clinical measurements within normal limits will improve Outcome: Progressing

## 2024-08-15 NOTE — ED Provider Notes (Signed)
 [place] [facility] AT [facility] Provider Note   CSN: [id number] Arrival date & time: 08/14/24  2257     Patient presents with: Weakness   Gabrielle Wood is a 78 y.o. female.   Brought to the emergency department by family for evaluation of generalized weakness, shortness of breath.  Patient was just discharged from [facility] yesterday after treatment for pneumonia.  Family reports that she is not doing any better.  Patient reports that she feels terrible.  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       Prior to Admission medications   Medication Sig Start Date End Date Taking? Authorizing Provider  albuterol  (PROVENTIL ) (2.5 MG/3ML) 0.083% nebulizer solution Take 3 mLs (2.5 mg total) by nebulization every 6 (six) hours as needed for wheezing or shortness of breath. 09/30/23   Ula Prentice SAUNDERS, MD  albuterol  (VENTOLIN  HFA) 108 [phone] Base) MCG/ACT inhaler Inhale 1-2 puffs into the lungs every 4 (four) hours as needed for wheezing or shortness of breath. 09/30/23   Ula Prentice SAUNDERS, MD  ALPRAZolam  (XANAX ) 1 MG tablet Take 0.5-1 mg by mouth. 0.5 tablet in am and 0.5 tab at night 04/11/21   [provider]  amLODipine  (NORVASC ) 5 MG tablet Take 5 mg by mouth daily. 04/20/21   [provider]  aspirin  EC 81 MG tablet Take 81 mg by mouth daily. Swallow whole.    [provider]  atorvastatin  (LIPITOR) 80 MG tablet Take 80 mg by mouth every evening. 11/24/21   [provider]  Continuous Glucose Sensor (FREESTYLE LIBRE 3 PLUS SENSOR) MISC Change sensor every 15 days. 05/06/24   Therisa Benton JINNY, NP  dicyclomine  (BENTYL ) 20 MG tablet Take 1 tablet (20 mg total) by mouth 2 (two) times daily. 07/05/22   Theotis Peers M, PA-C  gabapentin  (NEURONTIN ) 300 MG capsule Take 300 mg by mouth 3 (three) times daily. 07/29/21   [provider]  insulin  degludec (TRESIBA  FLEXTOUCH) 100  UNIT/ML FlexTouch Pen Inject 26 Units into the skin at bedtime. 05/06/24   Therisa Benton JINNY, NP  insulin  degludec (TRESIBA ) 100 UNIT/ML FlexTouch Pen Inject 30 Units into the skin at bedtime. 04/22/24   Therisa Benton JINNY, NP  insulin  lispro (HUMALOG  KWIKPEN) 100 UNIT/ML KwikPen Inject 8-14 Units into the skin 3 (three) times daily. 05/06/24   Therisa Benton JINNY, NP  levocetirizine (XYZAL ) 5 MG tablet Take 1 tablet (5 mg total) by mouth every evening. 04/13/21   Cleotilde Rogue, MD  levothyroxine  (SYNTHROID ) 125 MCG tablet Take 1 tablet (125 mcg total) by mouth daily before breakfast. 05/06/24   Therisa Benton JINNY, NP  metoprolol  succinate (TOPROL -XL) 50 MG 24 hr tablet Take 1 tablet (50 mg total) by mouth every evening. Take with or immediately following a meal. 10/17/21 01/29/24  Shahmehdi, Adriana LABOR, MD  nitroGLYCERIN (NITROSTAT) 0.4 MG SL tablet Place 0.4 mg under the tongue every 5 (five) minutes as needed for chest pain.    [provider]  ondansetron  (ZOFRAN -ODT) 4 MG disintegrating tablet Take 4 mg by mouth every 8 (eight) hours as needed. 06/27/21   [provider]  pantoprazole  (PROTONIX ) 40 MG tablet Take 40 mg by mouth daily. 06/06/21   [provider]  ticagrelor  (BRILINTA ) 90 MG TABS tablet Take 90 mg by mouth 2 (two) times daily.    [provider]  valsartan (DIOVAN) 80 MG tablet Take 40 mg by mouth  daily.    [provider]  vitamin C (ASCORBIC ACID) 500 MG tablet Take 500 mg by mouth daily.    [provider]  fexofenadine (ALLEGRA) 60 MG tablet Take by mouth.  04/13/21  [provider]    Allergies: Amlodipine , Ciprocin-fluocin-procin [fluocinolone], Lipitor [atorvastatin ], Nebivolol, Nitrofuran derivatives, Lisinopril, and Red yeast rice [cholestin]    Review of Systems  Updated Vital Signs BP (!) 111/58   Pulse 85   Temp 99.1 F (37.3 C) (Axillary)   Resp (!) 22   Ht 5' 7 (1.702 m)   Wt 69 kg   SpO2 98%   BMI 23.82  kg/m   Physical Exam Vitals and nursing note reviewed.  Constitutional:      General: She is not in acute distress.    Appearance: She is well-developed.  HENT:     Head: Normocephalic and atraumatic.     Mouth/Throat:     Mouth: Mucous membranes are moist.  Eyes:     General: Vision grossly intact. Gaze aligned appropriately.     Extraocular Movements: Extraocular movements intact.     Conjunctiva/sclera: Conjunctivae normal.  Cardiovascular:     Rate and Rhythm: Normal rate and regular rhythm.     Pulses:          Dorsalis pedis pulses are 1+ on the right side and 1+ on the left side.     Heart sounds: Normal heart sounds, S1 normal and S2 normal. No murmur heard.    No friction rub. No gallop.  Pulmonary:     Effort: Pulmonary effort is normal. Tachypnea present. No respiratory distress.     Breath sounds: Normal breath sounds.  Abdominal:     General: Bowel sounds are normal.     Palpations: Abdomen is soft.     Tenderness: There is no abdominal tenderness. There is no guarding or rebound.     Hernia: No hernia is present.  Musculoskeletal:        General: No swelling.     Cervical back: Full passive range of motion without pain, normal range of motion and neck supple. No spinous process tenderness or muscular tenderness. Normal range of motion.     Right lower leg: No edema.     Left lower leg: No edema.  Skin:    General: Skin is warm and dry.     Capillary Refill: Capillary refill takes less than 2 seconds.     Findings: No ecchymosis, erythema, rash or wound.  Neurological:     General: No focal deficit present.     Mental Status: She is alert and oriented to person, place, and time.     GCS: GCS eye subscore is 4. GCS verbal subscore is 5. GCS motor subscore is 6.     Cranial Nerves: Cranial nerves 2-12 are intact.     Sensory: Sensation is intact.     Motor: Motor function is intact.     Coordination: Coordination is intact.  Psychiatric:        Attention and  Perception: Attention normal.        Mood and Affect: Mood normal.        Speech: Speech normal.        Behavior: Behavior normal.     (all labs ordered are listed, but only abnormal results are displayed) Labs Reviewed  COMPREHENSIVE METABOLIC PANEL WITH GFR - Abnormal; Notable for the following components:      Result Value   Sodium 133 (*)  CO2 21 (*)    Glucose, Bld 183 (*)    Creatinine, Ser 1.17 (*)    Albumin 3.3 (*)    GFR, Estimated 47 (*)    All other components within normal limits  LACTIC ACID, PLASMA - Abnormal; Notable for the following components:   Lactic Acid, Venous 2.7 (*)    All other components within normal limits  CBC WITH DIFFERENTIAL/PLATELET - Abnormal; Notable for the following components:   WBC 19.7 (*)    RBC 3.83 (*)    Hemoglobin 11.3 (*)    HCT 33.6 (*)    RDW 15.7 (*)    Neutro Abs 17.3 (*)    Abs Immature Granulocytes 0.16 (*)    All other components within normal limits  CULTURE, BLOOD (ROUTINE X 2)  RESP PANEL BY RT-PCR (RSV, FLU A&B, COVID)  RVPGX2  CULTURE, BLOOD (ROUTINE X 2)  LACTIC ACID, PLASMA  PROTIME-INR  URINALYSIS, W/ REFLEX TO CULTURE (INFECTION SUSPECTED)    EKG: EKG Interpretation Date/Time:  Friday August 14 2024 23:20:03 EDT Ventricular Rate:  83 PR Interval:  152 QRS Duration:  87 QT Interval:  352 QTC Calculation: 414 R Axis:   -61  Text Interpretation: Sinus rhythm Left anterior fascicular block Abnormal R-wave progression, early transition Consider left ventricular hypertrophy Confirmed by Haze Lonni PARAS [phone]) on 08/15/2024 12:32:09 AM  Radiology: [facility] Chest Port 1 View if patient is in a treatment room. Result Date: 08/14/2024 EXAM: 1 VIEW XRAY OF THE CHEST 08/14/2024 11:21:20 PM COMPARISON: None available. CLINICAL HISTORY: Suspected Sepsis. Pt brought in by family with increased weakness after being discharged yesterday from [facility] with diagnosis of Pneumonia.Pt wears O2 @ 2L [place] at all times.  FINDINGS: LUNGS AND PLEURA: Interval development of a small left pleural effusion with associated left basilar atelectasis. Lungs are otherwise clear. No pneumothorax. No pleural effusion on the right. HEART AND MEDIASTINUM: Cardiac size within normal limits. Pulmonary vascularity is normal. BONES AND SOFT TISSUES: No acute osseous abnormality. IMPRESSION: 1. Interval development of a small left pleural effusion with associated left basilar atelectasis. Electronically signed by: Dorethia Molt MD 08/14/2024 11:28 PM EDT RP Workstation: [id number]     Procedures   Medications Ordered in the ED  lactated ringers  infusion ( Intravenous New Bag/Given 08/15/24 0102)  vancomycin  (VANCOCIN ) IVPB 1000 mg/200 mL premix (1,000 mg Intravenous New Bag/Given 08/15/24 0105)  lactated ringers  bolus 1,000 mL (1,000 mLs Intravenous New Bag/Given 08/14/24 2356)    And  lactated ringers  bolus 1,000 mL (1,000 mLs Intravenous New Bag/Given 08/14/24 2356)    And  lactated ringers  bolus 250 mL (250 mLs Intravenous New Bag/Given 08/15/24 0102)  ceFEPIme  (MAXIPIME ) 2 g in sodium chloride  0.9 % 100 mL IVPB (0 g Intravenous Stopped 08/15/24 0026)  metroNIDAZOLE  (FLAGYL ) IVPB 500 mg (0 mg Intravenous Stopped 08/15/24 0055)                                    Medical Decision Making Amount and/or Complexity of Data Reviewed Labs: ordered. Decision-making details documented in ED Course. Radiology: ordered and independent interpretation performed. Decision-making details documented in ED Course.  Risk Prescription drug management.   Differential diagnosis considered includes, but not limited to: Pneumonia; urinary tract infection; dehydration; sepsis  Presents to the emergency department for generalized weakness.  Family reports that she has been in and out of the hospital at [facility] recently.  She was  discharged from the hospital yesterday after treatment for pneumonia.  Patient does have chronic respiratory  failure, on O2 at 2 L continuously.  She is not hypoxic on her normal oxygen  here in the ED.  Blood pressures were low with tachypnea at arrival.  With recent treatment for pneumonia, sepsis considered and broad-spectrum antibiotics administered.  Patient given IV fluid bolus.  She did have an elevated lactic acid on arrival which has cleared after fluid resuscitation.  Chest x-ray without obvious pneumonia.  She does have a significant leukocytosis, and her blood work fairly unremarkable.  Urinalysis does not suggest infection.  CRITICAL CARE Performed by: Lonni JINNY Seats   Total critical care time: 30 minutes  Critical care time was exclusive of separately billable procedures and treating other patients.  Critical care was necessary to treat or prevent imminent or life-threatening deterioration.  Critical care was time spent personally by me on the following activities: development of treatment plan with patient and/or surrogate as well as nursing, discussions with consultants, evaluation of patient's response to treatment, examination of patient, obtaining history from patient or surrogate, ordering and performing treatments and interventions, ordering and review of laboratory studies, ordering and review of radiographic studies, pulse oximetry and re-evaluation of patient's condition.      Final diagnoses:  Sepsis, due to unspecified organism, unspecified whether acute organ dysfunction present [facility])    ED Discharge Orders     None          Seats Lonni JINNY, MD 08/15/24 [phone]

## 2024-08-15 NOTE — Progress Notes (Signed)
 PROGRESS NOTE    LIN HACKMANN  FMW:[id number] DOB: [birth year] DOA: 08/14/2024 PCP: Trudy Vaughn FALCON, MD   Brief Narrative: Gabrielle Wood is a 78 y.o. female with a history of COPD, chronic respiratory failure, diabetes mellitus type 2, hypertension, hyperlipidemia, hypothyroidism, GERD.  Patient presented secondary to weakness and failure to thrive and found to have concern for sepsis with evidence of pneumonia. Empiric antibiotics started..   Assessment/Plan:  Sepsis Present on admission. Likely secondary to pneumonia; urinalysis does not suggest infection. Lactic acidosis with lactic acid of 2.7 down to 1.9. IV fluids started, blood cultures obtained and empiric Vancomycin , Cefepime  and Flagyl  started. -Continue antibiotics -Follow-up blood cultures  Community acquired pneumonia CT imaging consistent with bilateral posterior pleural thickening with mild subpleural consolidation/atelectasis. Associated trace pleural effusion noted. With presenting clinical picture, likely infectious etiology, consistent with pneumonia. Procalcitonin elevated at 0.98 -Continue Vancomycin  and Cefepime  -Discontinue Flagyl   AKI Baseline creatinine of about 0.6 to 0.8. Creatinine of 1.17 on admission. IV fluids started. CT imaging identifies right-sided hydronephrosis. -Continue IV fluids -BMP in AM -Renal ultrasound in AM if no improvement of renal function  Failure to thrive Noted. PT/OT consulted for evaluation.  Left hip pain No precipitating injury. Hip x-ray obtained and is significant for mild hip osteoarthritis. -Analgesics as needed -Outpatient orthopedic surgery follow-up as needed  Acute metabolic encephalopathy Likely related to underlying infection. Resolved.  COPD No evidence of exacerbation.  Constipation -MiraLAX   Chronic respiratory failure with hypoxia Patient is on 2 L/min baseline oxygen  use. Stable.  CAD No chest pain. History of stent placement and  currently on dual antiplatelet therapy with aspirin  and Brilinta . Patient is also on Lipitor. -Continue aspirin , Brilinta , Lipitor  Diabetes mellitus type 2 Uncontrolled with hyperglycemia based on last hemoglobin A1C of 10.4%Patient is on Tresiba  26 units at bedtime and SSI.  Primary hypertension -Continue metoprolol  succinate  Hyperlipidemia -Continue Lipitor  Hypothyroidism -Continue Synthroid   GERD -Continue Protonix   Anxiety -Continue Xanax  as needed  Chronic pain Neuropathy -Continue gabapentin    DVT prophylaxis: SCDs Code Status:   Code Status: Full Code Family Communication: None at bedside Disposition Plan: Discharge pending transition to outpatient antibiotics, improvement of symptoms and blood culture data   Consultants:  None  Procedures:  None  Antimicrobials: Vancomycin  Cefepime  Flagyl     Subjective: Patient reports feeling unwell. Some cough. No chest pain or dyspnea. Hip pain, which is chronic over the last year.  Objective: BP 128/79   Pulse 79   Temp 99.1 F (37.3 C) (Axillary)   Resp 17   Ht 5' 7 (1.702 m)   Wt 69 kg   SpO2 97%   BMI 23.82 kg/m   Examination:  General exam: Appears calm and comfortable Respiratory system: Diminished. Mild rales. Mild tachypnea. Respiratory effort normal. Cardiovascular system: S1 & S2 heard, RRR. No murmurs. Gastrointestinal system: Abdomen is nondistended, soft and nontender. Normal bowel sounds heard. Central nervous system: Alert and oriented. No focal neurological deficits. Musculoskeletal: BLE 1+ pitting edema. No calf tenderness. No left hip tenderness. External and internal rotation does not elicit pain Psychiatry: Judgement and insight appear normal. Mood & affect appropriate.    Data Reviewed: I have personally reviewed following labs and imaging studies   Last CBC Lab Results  Component Value Date   WBC 19.7 (H) 08/14/2024   HGB 11.3 (L) 08/14/2024   HCT 33.6 (L) 08/14/2024    MCV 87.7 08/14/2024   MCH 29.5 08/14/2024   RDW 15.7 (H) 08/14/2024  PLT 247 08/14/2024     Last metabolic panel Lab Results  Component Value Date   GLUCOSE 183 (H) 08/14/2024   NA 133 (L) 08/14/2024   K 3.7 08/14/2024   CL 100 08/14/2024   CO2 21 (L) 08/14/2024   BUN 17 08/14/2024   CREATININE 1.17 (H) 08/14/2024   GFRNONAA 47 (L) 08/14/2024   CALCIUM  9.8 08/14/2024   PHOS 1.2 (L) 10/15/2021   PROT 7.4 08/14/2024   ALBUMIN 3.3 (L) 08/14/2024   LABGLOB 2.3 07/17/2017   AGRATIO 1.9 07/17/2017   BILITOT 0.8 08/14/2024   ALKPHOS 53 08/14/2024   AST 18 08/14/2024   ALT 11 08/14/2024   ANIONGAP 12 08/14/2024     Creatinine Clearance: Estimated Creatinine Clearance: 37.9 mL/min (A) (by C-G formula based on SCr of 1.17 mg/dL (H)).  Recent Results (from the past 240 hours)  Culture, blood (Routine x 2)     Status: None (Preliminary result)   Collection Time: 08/14/24 11:30 PM   Specimen: BLOOD RIGHT HAND  Result Value Ref Range Status   Specimen Description BLOOD RIGHT HAND  Final   Special Requests   Final    BOTTLES DRAWN AEROBIC AND ANAEROBIC Blood Culture adequate volume Performed at [facility], [address] [place], [place] [address]    Culture PENDING  Incomplete   Report Status PENDING  Incomplete  Resp panel by RT-PCR (RSV, Flu A&B, Covid) Anterior Nasal Swab     Status: None   Collection Time: 08/14/24 11:43 PM   Specimen: Anterior Nasal Swab  Result Value Ref Range Status   SARS Coronavirus 2 by RT PCR NEGATIVE NEGATIVE Final    Comment: (NOTE) SARS-CoV-2 target nucleic acids are NOT DETECTED.  The SARS-CoV-2 RNA is generally detectable in upper respiratory specimens during the acute phase of infection. The lowest concentration of SARS-CoV-2 viral copies this assay can detect is 138 copies/mL. A negative result does not preclude SARS-Cov-2 infection and should not be used as the sole basis for treatment or other patient management decisions. A negative  result may occur with  improper specimen collection/handling, submission of specimen other than nasopharyngeal swab, presence of viral mutation(s) within the areas targeted by this assay, and inadequate number of viral copies(<138 copies/mL). A negative result must be combined with clinical observations, patient history, and epidemiological information. The expected result is Negative.  Fact Sheet for Patients:  BloggerCourse.com  Fact Sheet for Healthcare Providers:  SeriousBroker.it  This test is no t yet approved or cleared by the [place]  FDA and  has been authorized for detection and/or diagnosis of SARS-CoV-2 by FDA under an Emergency Use Authorization (EUA). This EUA will remain  in effect (meaning this test can be used) for the duration of the COVID-19 declaration under Section 564(b)(1) of the Act, 21 U.S.C.section 360bbb-3(b)(1), unless the authorization is terminated  or revoked sooner.       Influenza A by PCR NEGATIVE NEGATIVE Final   Influenza B by PCR NEGATIVE NEGATIVE Final    Comment: (NOTE) The Xpert Xpress SARS-CoV-2/FLU/RSV plus assay is intended as an aid in the diagnosis of influenza from Nasopharyngeal swab specimens and should not be used as a sole basis for treatment. Nasal washings and aspirates are unacceptable for Xpert Xpress SARS-CoV-2/FLU/RSV testing.  Fact Sheet for Patients: BloggerCourse.com  Fact Sheet for Healthcare Providers: SeriousBroker.it  This test is not yet approved or cleared by the [place]  FDA and has been authorized for detection and/or diagnosis of SARS-CoV-2 by FDA under  an Emergency Use Authorization (EUA). This EUA will remain in effect (meaning this test can be used) for the duration of the COVID-19 declaration under Section 564(b)(1) of the Act, 21 U.S.C. section 360bbb-3(b)(1), unless the authorization is  terminated or revoked.     Resp Syncytial Virus by PCR NEGATIVE NEGATIVE Final    Comment: (NOTE) Fact Sheet for Patients: BloggerCourse.com  Fact Sheet for Healthcare Providers: SeriousBroker.it  This test is not yet approved or cleared by the [place]  FDA and has been authorized for detection and/or diagnosis of SARS-CoV-2 by FDA under an Emergency Use Authorization (EUA). This EUA will remain in effect (meaning this test can be used) for the duration of the COVID-19 declaration under Section 564(b)(1) of the Act, 21 U.S.C. section 360bbb-3(b)(1), unless the authorization is terminated or revoked.  Performed at [facility], [address] [place], [place] [address]       Radiology Studies: CT CHEST ABDOMEN PELVIS W CONTRAST Result Date: 08/15/2024 EXAM: CT CHEST, ABDOMEN AND PELVIS WITH CONTRAST 08/15/2024 06:31:49 AM TECHNIQUE: CT of the chest, abdomen and pelvis was performed with the administration of intravenous contrast. Multiplanar reformatted images are provided for review. Automated exposure control, iterative reconstruction, and/or weight based adjustment of the mA/kV was utilized to reduce the radiation dose to as low as reasonably achievable. COMPARISON: CT abdomen and pelvis 07/05/2022 and CT angio chest 10/14/2021. CLINICAL HISTORY: Sepsis, unclear source. ? L basilar infiltrate. recent imaging with question of R pyelo, pelviectasis, ? of prox ureteral stricture, endometrial thickening. increased weakness after being discharged yesterday from [facility] with diagnosis of Pneumonia.Pt wears O2 @ 2L [place] at all times. Family reports CBG of 236 at home. Family states pt like a dishrag and needed staff to help family get pt out of car. FINDINGS: CHEST: MEDIASTINUM AND LYMPH NODES: Heart size is upper limits of normal. No pericardial effusion. No mediastinal or hilar adenopathy. Prominent nonpathologically enlarged lymph nodes  within the chest are likely reactive in etiology and appear similar to previous exam. Central airways are patent. LUNGS AND PLEURA: Bilateral posterior pleural thickening with subpleural consolidation or atelectasis noted within the lung bases, left greater than right. Mild diffuse interlobular septal thickening is noted. Central airway thickening with mild prominence of the peribronchovascular interstitium. No pneumothorax identified. Subpleural scarring versus atelectasis within the posterior medial left upper lobe. No suspicious lung nodule or mass. ABDOMEN AND PELVIS: LIVER: The liver is unremarkable. GALLBLADDER AND BILE DUCTS: Gallbladder is unremarkable. No biliary ductal dilatation. SPLEEN: No acute abnormality. PANCREAS: No acute abnormality. ADRENAL GLANDS: No acute abnormality. KIDNEYS, URETERS AND BLADDER: Bilateral simple kidney cysts are identified. The largest cyst arises off the lateral right kidney measuring 4.4 cm, image 76/2. No follow up imaging recommended. There is moderate right-sided hydronephrosis, new from the previous exam. Dilatation of the right renal pelvis is identified up to the UPJ. The ureter appears normal in caliber without signs of ureteral calculi. Moderate bladder distention without focal bladder abnormality. GI AND BOWEL: Small hiatal hernia. The appendix is visualized and appears normal. Moderate stool burden identified within the cecum and ascending colon. Mild stool burden noted elsewhere in the colon. No pathologic dilatation of the large or small bowel loops. No signs of bowel inflammation. REPRODUCTIVE ORGANS: Calcified uterine fibroid noted. No adnexal mass. PERITONEUM AND RETROPERITONEUM: No free fluid or fluid collections. No signs of free air. VASCULATURE: Aortic atherosclerotic calcification. ABDOMINAL AND PELVIS LYMPH NODES: No signs of abdominal or pelvic adenopathy. BONES AND SOFT TISSUES: Lumbar  spondylosis. No acute osseous abnormality. No focal soft tissue  abnormality. IMPRESSION: 1. Trace right pleural effusion with bilateral posterior pleural thickening with mild subpleural consolidation/atelectasis, left greater than right. Findings are favored to represent sequelae of inflammation/infection. 2. Thickening of the peribronchovascular interstitium with mild interstitial thickening is nonspecific but may reflect mild interstitial edema. 3. Right-sided hydronephrosis new from the previous exam, with dilatation of the right renal pelvis up to the UPJ. The ureter appears normal in caliber without signs of ureteral calculi. Differential considerations include sequelae of recently passed Kidney Stone reflux, or ascending urinary tract infection. Underlying urothelial lesion is less favored Recommend follow-up imaging to ensure resolution and to exclude underlying obstructing lesion. 4. Moderate stool burden.  Correlate for signs and symptoms of constipation. Electronically signed by: Waddell Calk MD 08/15/2024 06:54 AM EDT RP Workstation: [id number]   DG Chest Port 1 View if patient is in a treatment room. Result Date: 08/14/2024 EXAM: 1 VIEW XRAY OF THE CHEST 08/14/2024 11:21:20 PM COMPARISON: None available. CLINICAL HISTORY: Suspected Sepsis. Pt brought in by family with increased weakness after being discharged yesterday from [facility] with diagnosis of Pneumonia.Pt wears O2 @ 2L [place] at all times. FINDINGS: LUNGS AND PLEURA: Interval development of a small left pleural effusion with associated left basilar atelectasis. Lungs are otherwise clear. No pneumothorax. No pleural effusion on the right. HEART AND MEDIASTINUM: Cardiac size within normal limits. Pulmonary vascularity is normal. BONES AND SOFT TISSUES: No acute osseous abnormality. IMPRESSION: 1. Interval development of a small left pleural effusion with associated left basilar atelectasis. Electronically signed by: Dorethia Molt MD 08/14/2024 11:28 PM EDT RP Workstation: [id number]      LOS: 0 days    Elgin Lam, MD [facility] 08/15/2024, 7:12 AM   If 7PM-7AM, please contact night-coverage [email or web address]

## 2024-08-15 NOTE — H&P (Signed)
 History and Physical    Gabrielle Wood FMW:[id number] DOB: [birth year] DOA: 08/14/2024  PCP: Trudy Vaughn FALCON, MD   Patient coming from: Home   Chief Complaint:  Chief Complaint  Patient presents with   Weakness    HPI:  Gabrielle Wood is a 78 y.o. 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 Per EDP report recurrent pattern of infections and worsening out of the hospital. Somnolent / altered initially on ED evaluation.   Re: recent admissions:  -- admitted to [facility] from 9/9 - 11 with weakness, initial suspicion for sepsis and treated with Cefepime , but ultimately no infection identified; she did have endometrial thickening, moderate R pelviectasis, ? Ureteral strictura, and ? R perfusion abnormality v pyelonephritis. Otherwise had ? TIA type symptoms with slurred speech + facial droop with negative MRI evaluation.  -- Admitted to [facility] 8/29 - 9/1 with SOB. Treated for LLL pneumonia with Levaquin   -- Admitted to [facility] 8/18 - 8/21 with decreased responsiveness, hyperglycemia. Treated for UTI, Ucx + pseudomonas (Pan - susceptible)  -- Admitted to Rehab 7/25 - 8/12  -- Admitted to [facility] 7/16 - 7/25 with SOB, treated for Pneumonia, COPD exacerbation -- Admitted to [facility] 6/25 - 6/27 with COPD exacerbation  -- Admitted to [facility] 5/15 - 19 with AMS treated for sepsis 2/2 UTI, Ucx + GBS  -- Admitted to [facility] 3/29 - 4/2 with AMS, treated for COVID, UTI   On my interview patient awake and oriented, she reports being out of my head over the past day. And since being out of hospital reports nausea, decreased  PO intake, but no vomiting. She also c/o pain in her mid back, and pain in her hips. Denies any fall. No fevers, chills, cough, cold-symptoms, SOB, chest pain, abd pain, diarrhea, dysuria, rashes, headache / neck pain.    Review of Systems:  ROS complete and negative except as marked above   Allergies  Allergen Reactions   Amlodipine     Ciprocin-Fluocin-Procin [Fluocinolone]     Nightmares   Lipitor [Atorvastatin ] Other (See Comments)    Muscle aches, pain   Nebivolol    Nitrofuran Derivatives    Lisinopril Cough   Red Yeast Rice [Cholestin] Itching and Rash    Prior to Admission medications   Medication Sig Start Date End Date Taking? Authorizing Provider  albuterol  (PROVENTIL ) (2.5 MG/3ML) 0.083% nebulizer solution Take 3 mLs (2.5 mg total) by nebulization every 6 (six) hours as needed for wheezing or shortness of breath. 09/30/23   Ula Prentice SAUNDERS, MD  albuterol  (VENTOLIN  HFA) 108 (90 Base) MCG/ACT inhaler Inhale 1-2 puffs into the lungs every 4 (four) hours as needed for wheezing or shortness of breath. 09/30/23   Ula Prentice SAUNDERS, MD  ALPRAZolam  (XANAX ) 1 MG tablet Take 0.5-1 mg by mouth. 0.5 tablet in am and 0.5 tab at night 04/11/21   [provider]  amLODipine  (NORVASC ) 5 MG tablet Take 5 mg by mouth daily. 04/20/21   [provider]  aspirin  EC 81 MG tablet Take 81 mg by mouth daily. Swallow whole.    [provider]  atorvastatin  (LIPITOR) 80 MG tablet Take 80 mg by mouth every evening. 11/24/21   [provider]  Continuous Glucose Sensor (FREESTYLE LIBRE 3 PLUS SENSOR) MISC Change sensor every 15 days. 05/06/24  Therisa Benton PARAS, NP  dicyclomine  (BENTYL ) 20 MG tablet Take 1 tablet (20 mg total) by mouth 2 (two) times daily. 07/05/22   Theotis Cameron HERO, PA-C  gabapentin  (NEURONTIN ) 300 MG capsule Take 300 mg by mouth 3 (three) times daily. 07/29/21   [provider]  insulin  degludec (TRESIBA  FLEXTOUCH) 100 UNIT/ML FlexTouch Pen Inject 26 Units into the skin at bedtime. 05/06/24   Therisa Benton PARAS, NP  insulin  degludec (TRESIBA ) 100 UNIT/ML FlexTouch Pen Inject 30 Units into the skin at bedtime. 04/22/24   Therisa Benton PARAS, NP  insulin  lispro (HUMALOG   KWIKPEN) 100 UNIT/ML KwikPen Inject 8-14 Units into the skin 3 (three) times daily. 05/06/24   Therisa Benton PARAS, NP  levocetirizine (XYZAL ) 5 MG tablet Take 1 tablet (5 mg total) by mouth every evening. 04/13/21   Cleotilde Rogue, MD  levothyroxine  (SYNTHROID ) 125 MCG tablet Take 1 tablet (125 mcg total) by mouth daily before breakfast. 05/06/24   Therisa Benton PARAS, NP  metoprolol  succinate (TOPROL -XL) 50 MG 24 hr tablet Take 1 tablet (50 mg total) by mouth every evening. Take with or immediately following a meal. 10/17/21 01/29/24  Shahmehdi, Adriana LABOR, MD  nitroGLYCERIN (NITROSTAT) 0.4 MG SL tablet Place 0.4 mg under the tongue every 5 (five) minutes as needed for chest pain.    [provider]  ondansetron  (ZOFRAN -ODT) 4 MG disintegrating tablet Take 4 mg by mouth every 8 (eight) hours as needed. 06/27/21   [provider]  pantoprazole  (PROTONIX ) 40 MG tablet Take 40 mg by mouth daily. 06/06/21   [provider]  ticagrelor  (BRILINTA ) 90 MG TABS tablet Take 90 mg by mouth 2 (two) times daily.    [provider]  valsartan (DIOVAN) 80 MG tablet Take 40 mg by mouth daily.    [provider]  vitamin C (ASCORBIC ACID) 500 MG tablet Take 500 mg by mouth daily.    [provider]  fexofenadine (ALLEGRA) 60 MG tablet Take by mouth.  04/13/21  [provider]    Past Medical History:  Diagnosis Date   Arthritis    COPD (chronic obstructive pulmonary disease) (HCC)    Diabetes mellitus    DVT (deep venous thrombosis) (HCC)    years ago   GERD (gastroesophageal reflux disease)    H. pylori infection 2013   S/p treatment with amoxicillin , Biaxin, PPI   High cholesterol    Hypertension    Hypothyroidism    NSTEMI (non-ST elevated myocardial infarction) (HCC) 12/2020   s/p  PCI DES to the LCx   Thyroid  disease     Past Surgical History:  Procedure Laterality Date   BACK SURGERY     lumbar x3    ESOPHAGOGASTRODUODENOSCOPY  04/04/2012    Moderate gastritis/MODERATE Duodenitis in the duodenal bulb duodenum.  Pathology with chronic H. pylori gastritis.   EUS  05/14/2012   Normal pancreas, gallbladder,biliary tree/ Incidental finding of simple-appearing right renal cyst.   TONSILLECTOMY     TUBAL LIGATION     VEIN SURGERY     stripping years ago but recent laser surgery; bilateral legs     reports that she has quit smoking. Her smoking use included cigarettes. She has never used smokeless tobacco. She reports that she does not drink alcohol  and does not use drugs.  Family History  Problem Relation Age of Onset   Pancreatic cancer Mother        diagnosed at age 57s, died from heart attack   Inflammatory bowel  disease Brother        crohn's, his dgt deceased from colon cancer at age 71   Colon cancer Neg Hx      Physical Exam: Vitals:   08/15/24 0230 08/15/24 0300 08/15/24 0330 08/15/24 0400  BP: 120/71 133/71 137/70 128/79  Pulse: 82 80 78 79  Resp: 17 18 20 17   Temp:      TempSrc:      SpO2: 98% 97% 98% 97%  Weight:      Height:        Gen: Somnolent but remains awake alert, ill appearing, NAD   HEENT: cord like prominence on the hard palate  CV: Regular, normal S1, S2, no murmurs  Resp: Normal WOB, scant rales L base, otherwise clear.  Abd: Round, nondistended, normoactive, mild diffuse tenderness.  MSK: Back no midline spinal tenderness. Symmetric, no edema  Skin: No rashes or lesions to exposed skin, she is flushed and hot  Neuro: Somnolent but Alert and interactive. Full oriented. CN 2- 12 intact, motor is 5/5 and symmetric. Sensation intact and equal to fine touch.  Psych: euthymic, appropriate    Data review:   Labs reviewed, notable for:   A1c on 9/10 was 10.5%  Micro review from recent hospitalization, 9/10 Ucx NG; 8/18 + 50-100 K Pseudomonas (Pan-S)  NA 133 Bicarb 21, AG 12 Lactate 2.7 -> 1.9 BG 183 Creatinine 1.1 LFT within normal limit WBC 19, neutrophil predominant UA rare bacteria,  no pyuria, leukocyte, nitrate  Micro:  Results for orders placed or performed during the hospital encounter of 08/14/24  Culture, blood (Routine x 2)     Status: None (Preliminary result)   Collection Time: 08/14/24 11:30 PM   Specimen: BLOOD RIGHT HAND  Result Value Ref Range Status   Specimen Description BLOOD RIGHT HAND  Final   Special Requests   Final    BOTTLES DRAWN AEROBIC AND ANAEROBIC Blood Culture adequate volume Performed at [facility], [address], [place], [place] [address]    Culture PENDING  Incomplete   Report Status PENDING  Incomplete  Resp panel by RT-PCR (RSV, Flu A&B, Covid) Anterior Nasal Swab     Status: None   Collection Time: 08/14/24 11:43 PM   Specimen: Anterior Nasal Swab  Result Value Ref Range Status   SARS Coronavirus 2 by RT PCR NEGATIVE NEGATIVE Final    Comment: (NOTE) SARS-CoV-2 target nucleic acids are NOT DETECTED.  The SARS-CoV-2 RNA is generally detectable in upper respiratory specimens during the acute phase of infection. The lowest concentration of SARS-CoV-2 viral copies this assay can detect is 138 copies/mL. A negative result does not preclude SARS-Cov-2 infection and should not be used as the sole basis for treatment or other patient management decisions. A negative result may occur with  improper specimen collection/handling, submission of specimen other than nasopharyngeal swab, presence of viral mutation(s) within the areas targeted by this assay, and inadequate number of viral copies(<138 copies/mL). A negative result must be combined with clinical observations, patient history, and epidemiological information. The expected result is Negative.  Fact Sheet for Patients:  BloggerCourse.com  Fact Sheet for Healthcare Providers:  SeriousBroker.it  This test is no t yet approved or cleared by the [place]  FDA and  has been authorized for detection and/or diagnosis of  SARS-CoV-2 by FDA under an Emergency Use Authorization (EUA). This EUA will remain  in effect (meaning this test can be used) for the duration of the COVID-19 declaration under Section 564(b)(1) of  the Act, 21 U.S.C.section 360bbb-3(b)(1), unless the authorization is terminated  or revoked sooner.       Influenza A by PCR NEGATIVE NEGATIVE Final   Influenza B by PCR NEGATIVE NEGATIVE Final    Comment: (NOTE) The Xpert Xpress SARS-CoV-2/FLU/RSV plus assay is intended as an aid in the diagnosis of influenza from Nasopharyngeal swab specimens and should not be used as a sole basis for treatment. Nasal washings and aspirates are unacceptable for Xpert Xpress SARS-CoV-2/FLU/RSV testing.  Fact Sheet for Patients: BloggerCourse.com  Fact Sheet for Healthcare Providers: SeriousBroker.it  This test is not yet approved or cleared by the [place]  FDA and has been authorized for detection and/or diagnosis of SARS-CoV-2 by FDA under an Emergency Use Authorization (EUA). This EUA will remain in effect (meaning this test can be used) for the duration of the COVID-19 declaration under Section 564(b)(1) of the Act, 21 U.S.C. section 360bbb-3(b)(1), unless the authorization is terminated or revoked.     Resp Syncytial Virus by PCR NEGATIVE NEGATIVE Final    Comment: (NOTE) Fact Sheet for Patients: BloggerCourse.com  Fact Sheet for Healthcare Providers: SeriousBroker.it  This test is not yet approved or cleared by the [place]  FDA and has been authorized for detection and/or diagnosis of SARS-CoV-2 by FDA under an Emergency Use Authorization (EUA). This EUA will remain in effect (meaning this test can be used) for the duration of the COVID-19 declaration under Section 564(b)(1) of the Act, 21 U.S.C. section 360bbb-3(b)(1), unless the authorization is terminated  or revoked.  Performed at [facility], [address] [place], [place] [address]     Imaging reviewed:  [facility] Chest [facility] if patient is in a treatment room. Result Date: 08/14/2024 EXAM: 1 VIEW XRAY OF THE CHEST 08/14/2024 11:21:20 PM COMPARISON: None available. CLINICAL HISTORY: Suspected Sepsis. Pt brought in by family with increased weakness after being discharged yesterday from [facility] with diagnosis of Pneumonia.Pt wears O2 @ 2L [place] at all times. FINDINGS: LUNGS AND PLEURA: Interval development of a small left pleural effusion with associated left basilar atelectasis. Lungs are otherwise clear. No pneumothorax. No pleural effusion on the right. HEART AND MEDIASTINUM: Cardiac size within normal limits. Pulmonary vascularity is normal. BONES AND SOFT TISSUES: No acute osseous abnormality. IMPRESSION: 1. Interval development of a small left pleural effusion with associated left basilar atelectasis. Electronically signed by: Dorethia Molt MD 08/14/2024 11:28 PM EDT RP Workstation: [id number]    EKG:  Personally reviewed, sinus rhythm, LAD, likely repolarization change anteriorly no acute ischemic changes  ED Course:  Treated with 2.25 L IV fluid and started on rate of 150 an hour, broad antibiotics with vancomycin , cefepime , Flagyl    Assessment/Plan:  78 y.o. 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   Suspected sepsis, unclear source - suspect urinary / respiratory  Likely significant hypovolemia  P/w weakness, AMS. Recent hospitalization at [facility] for ? Sepsis and abnormal imaging finding per below, treated with Cefepime  which was stopped with neg Cultures. Initial evaluation tachypneic into the 30s, hypotensive to low 80s over 40s, afebrile. WBC 19,  neutrophil predominant with L shift. UA rare bacteria, without pyuria (possibly less impressive w recent antibiotic exposure). Cxr with small L pleural effusion and associated atelectasis; on recent CT imaging imaging a/p  9/10 c/f R ureteral stricture + mod R peliectasis, ? R perfusion abnormality or Pyelo, endometrial thickening, ; CTA Chest 9/9 Trace L pleural effusoin and small consolidation ? Atelectasis.  -- To reevaluate above recent imaging abnormalities, repeat CT C/A/P with IV contrast.  -- Continue broad abx for now with [facility] pharmacy to dose, Cefepime  2g IV q 12 hr, and Flagyl  500 mg IV q12 hr  -- Follow blood cultures, add on urine culture, check RVP, procalcitonin.  -- s/p 2.25 L IVF, lactate improving, continue on 150/hr for now   AKI stage I-II  Baseline Cr ~ 0.5 - 0.6, up to 1.1 on admission. Likely prerenal with dehydration, sepsis. May have R sided obstructive pathology but would not account for complete picture.  -- Reevaluation of ? R renal obstruction / urinary stricture per above  -- IVF per above  -Hold home valsartan  Failure to thrive   -- PT / OT evaluation   Acute encephalopathy- resolved  Somnolent in Ed, now improving + A+O without deficits. Suspect related to underlying infection    Chronic medical problems:  COPD, CHRF on 2L: Without acute exacerbation, continue home Trelegy Ellipta equivalent, nebs as needed CAD with stent: Continue on DAPT with aspirin , Brilinta , atorvastatin  DM type 2: Home regimen per DC summary was Tresiba  66 units daily, aspart 8 units with meals; However pt reports only taking Tresiba  26 units nightly + SSI but uncontrolled sugars; switch to Semglee  26 units daily, aspart 8 units with meals, SSI for sensitive for now, titrate as needed. HTN: Continue home metoprolol .  Hold home valsartan HLD: See CAD Hypothyroidism: Continue home levothyroxine , recent TSH at OSH normal, no need to retest GERD: Continue home pantoprazole  Mood disorder:  Continue home Xanax  0.25 mg nightly. Chronic pain / neuropathy: Reduce gabapentin  to 100 mg TID (home 200mg ) in setting of encephalopathy   Body mass index is 23.82 kg/m.    DVT prophylaxis:  SCDs Code Status:  DNR/DNI(Do NOT Intubate) Diet:  Diet Orders (From admission, onward)     Start     Ordered   08/14/24 2338  Diet NPO time specified  (Septic presentation on arrival (screening labs, nursing and treatment orders for obvious sepsis))  Diet effective now        08/14/24 2339           Family Communication:  None   Consults:  None   Admission status:   Inpatient, Telemetry bed  Severity of Illness: The appropriate patient status for this patient is INPATIENT. Inpatient status is judged to be reasonable and necessary in order to provide the required intensity of service to ensure the patient's safety. The patient's presenting symptoms, physical exam findings, and initial radiographic and laboratory data in the context of their chronic comorbidities is felt to place them at high risk for further clinical deterioration. Furthermore, it is not anticipated that the patient will be medically stable for discharge from the hospital within 2 midnights of admission.   * I certify that at the point of admission it is my clinical judgment that the patient will require inpatient hospital care spanning beyond 2 midnights from the point of admission due to high intensity of service, high risk for further deterioration and high frequency of surveillance required.*   Dorn Dawson, MD [facility]  How to contact the [facility] Attending or Consulting provider 7A - 7P or covering provider during after hours 7P -7A, for this patient.  Check the care team in [facility] and look  for a) attending/consulting [facility] provider listed and b) the [facility] team listed Log into [email or web address] and use [place]'s universal password to access. If you do not have the password, please contact the hospital  operator. Locate the [facility] provider you are looking for under [facility] and page to a number that you can be directly reached. If you still have difficulty reaching the provider, please page the [facility] (Director on Call) for the Hospitalists listed on amion for assistance.  08/15/2024, 4:53 AM

## 2024-08-15 NOTE — ED Notes (Signed)
 ED Provider at bedside.

## 2024-08-16 DIAGNOSIS — J189 Pneumonia, unspecified organism: Secondary | ICD-10-CM

## 2024-08-16 DIAGNOSIS — A419 Sepsis, unspecified organism: Secondary | ICD-10-CM | POA: Diagnosis not present

## 2024-08-16 LAB — HEMOGLOBIN AND HEMATOCRIT, BLOOD
HCT: 27.8 % — ABNORMAL LOW (ref 36.0–46.0)
Hemoglobin: 8.9 g/dL — ABNORMAL LOW (ref 12.0–15.0)

## 2024-08-16 LAB — BASIC METABOLIC PANEL WITH GFR
Anion gap: 9 (ref 5–15)
BUN: 14 mg/dL (ref 8–23)
CO2: 22 mmol/L (ref 22–32)
Calcium: 8.6 mg/dL — ABNORMAL LOW (ref 8.9–10.3)
Chloride: 102 mmol/L (ref 98–111)
Creatinine, Ser: 0.72 mg/dL (ref 0.44–1.00)
GFR, Estimated: >60 mL/min (ref >60)
Glucose, Bld: 242 mg/dL — ABNORMAL HIGH (ref 70–99)
Potassium: 4 mmol/L (ref 3.5–5.1)
Sodium: 133 mmol/L — ABNORMAL LOW (ref 135–145)

## 2024-08-16 LAB — CBC
HCT: 26.2 % — ABNORMAL LOW (ref 36.0–46.0)
Hemoglobin: 8.5 g/dL — ABNORMAL LOW (ref 12.0–15.0)
MCH: 29.1 pg (ref 26.0–34.0)
MCHC: 32.4 g/dL (ref 30.0–36.0)
MCV: 89.7 fL (ref 80.0–100.0)
Platelets: 196 K/uL (ref 150–400)
RBC: 2.92 MIL/uL — ABNORMAL LOW (ref 3.87–5.11)
RDW: 16 % — ABNORMAL HIGH (ref 11.5–15.5)
WBC: 8.9 K/uL (ref 4.0–10.5)
nRBC: 0 % (ref 0.0–0.2)

## 2024-08-16 LAB — HEMOGLOBIN A1C
Hgb A1c MFr Bld: 9.7 % — ABNORMAL HIGH (ref 4.8–5.6)
Mean Plasma Glucose: 231.69 mg/dL

## 2024-08-16 LAB — GLUCOSE, CAPILLARY
Glucose-Capillary: 298 mg/dL — ABNORMAL HIGH (ref 70–99)
Glucose-Capillary: 301 mg/dL — ABNORMAL HIGH (ref 70–99)
Glucose-Capillary: 173 mg/dL — ABNORMAL HIGH (ref 70–99)
Glucose-Capillary: 120 mg/dL — ABNORMAL HIGH (ref 70–99)

## 2024-08-16 LAB — PROCALCITONIN: Procalcitonin: 1.42 ng/mL

## 2024-08-16 MED ORDER — ZINC OXIDE 40 % EX OINT
TOPICAL_OINTMENT | Freq: Two times a day (BID) | CUTANEOUS | Status: DC
  Administered 2024-08-16 – 2024-08-17 (×2): 1 via TOPICAL
  Filled 2024-08-16: qty 57

## 2024-08-16 MED ORDER — VANCOMYCIN HCL 1250 MG/250ML IV SOLN
1250.0000 mg | INTRAVENOUS | Status: DC
  Administered 2024-08-16 – 2024-08-18 (×3): 1250 mg via INTRAVENOUS
  Filled 2024-08-16 (×3): qty 250

## 2024-08-16 MED ORDER — IPRATROPIUM-ALBUTEROL 0.5-2.5 (3) MG/3ML IN SOLN
3.0000 mL | Freq: Four times a day (QID) | RESPIRATORY_TRACT | Status: DC
  Administered 2024-08-16 – 2024-08-18 (×8): 3 mL via RESPIRATORY_TRACT
  Filled 2024-08-16 (×8): qty 3

## 2024-08-16 NOTE — Progress Notes (Addendum)
 PROGRESS NOTE    Gabrielle Wood  FMW:[id number] DOB: 1946/11/05 DOA: 08/14/2024 PCP: Trudy Vaughn FALCON, MD   Brief Narrative: Gabrielle Wood is a 78 y.o. female with a history of COPD, chronic respiratory failure, diabetes mellitus type 2, hypertension, hyperlipidemia, hypothyroidism, GERD.  Patient presented secondary to weakness and failure to thrive and found to have concern for sepsis with evidence of pneumonia. Empiric antibiotics started.   Assessment/Plan:  Sepsis Present on admission. Likely secondary to pneumonia; urinalysis does not suggest infection. Lactic acidosis with lactic acid of 2.7 down to 1.9. IV fluids started, blood cultures obtained and empiric Vancomycin , Cefepime  and Flagyl  started. -Continue antibiotics -Follow-up blood cultures  Community acquired pneumonia CT imaging consistent with bilateral posterior pleural thickening with mild subpleural consolidation/atelectasis. Associated trace pleural effusion noted. With presenting clinical picture, likely infectious etiology, consistent with pneumonia. Procalcitonin elevated at 0.98 and increased to 1.42. -Continue Vancomycin  and Cefepime ; likely transition to oral antibiotics if procalcitonin trends down in AM -Repeat procalcitonin in AM  AKI Baseline creatinine of about 0.6 to 0.8. Creatinine of 1.17 on admission. IV fluids started. CT imaging identifies right-sided hydronephrosis. Renal function improved to baseline.  Failure to thrive Noted. PT/OT consulted for evaluation; still pending.  Left hip pain No precipitating injury. Hip x-ray obtained and is significant for mild hip osteoarthritis. -Analgesics as needed -Outpatient orthopedic surgery follow-up as needed  Acute metabolic encephalopathy Likely related to underlying infection. Resolved.  COPD No evidence of exacerbation.  Constipation -MiraLAX   Chronic respiratory failure with hypoxia Patient is on 2 L/min baseline oxygen  use.  Stable.  CAD No chest pain. History of stent placement and currently on dual antiplatelet therapy with aspirin  and Brilinta . Patient is also on Lipitor. -Continue aspirin , Brilinta , Lipitor  Diabetes mellitus type 2 Uncontrolled with hyperglycemia based on last hemoglobin A1C of 10.4%Patient is on Tresiba  26 units at bedtime and SSI.  Primary hypertension -Continue metoprolol  succinate  Anemia Unclear if this is chronic or acute. Last hemoglobin of 13.5 from one year prior. Hemoglobin of 11.3 on admission, but this is possibly related to some hemoconcentration. Hemoglobin down to 8.5 today. No evidence of bleeding. Normal bilirubin. No associated leukopenia or thrombocytopenia. -CBC in AM -Check reticulocyte count -Check anemia panel  Hyperlipidemia -Continue Lipitor  Hypothyroidism -Continue Synthroid   GERD -Continue Protonix   Anxiety -Continue Xanax  as needed  Chronic pain Neuropathy -Continue gabapentin    DVT prophylaxis: SCDs Code Status:   Code Status: Limited: Do not attempt resuscitation (DNR) -DNR-LIMITED -Do Not Intubate/DNI  Family Communication: None at bedside. Called son, but no response. Disposition Plan: Discharge pending transition to outpatient antibiotics, improvement of symptoms and blood culture data. Likely medically stable for discharge tomorrow.   Consultants:  None  Procedures:  None  Antimicrobials: Vancomycin  Cefepime  Flagyl     Subjective: Feeling better this morning than yesterday. No cough.  Objective: BP 133/69 (BP Location: Left Arm)   Pulse 81   Temp 98.2 F (36.8 C) (Oral)   Resp 18   Ht 5' 6 (1.676 m)   Wt 72.6 kg   SpO2 97%   BMI 25.83 kg/m   Examination:  General exam: Appears calm and comfortable Respiratory system: Clear to auscultation. Respiratory effort normal. Cardiovascular system: S1 & S2 heard, RRR. Gastrointestinal system: Abdomen is nondistended, soft and nontender. Normal bowel sounds  heard. Central nervous system: Alert and oriented. Psychiatry: Judgement and insight appear normal. Mood & affect appropriate.    Data Reviewed: I have personally reviewed following labs  and imaging studies   Last CBC Lab Results  Component Value Date   WBC 8.9 08/16/2024   HGB 8.5 (L) 08/16/2024   HCT 26.2 (L) 08/16/2024   MCV 89.7 08/16/2024   MCH 29.1 08/16/2024   RDW 16.0 (H) 08/16/2024   PLT 196 08/16/2024     Last metabolic panel Lab Results  Component Value Date   GLUCOSE 242 (H) 08/16/2024   NA 133 (L) 08/16/2024   K 4.0 08/16/2024   CL 102 08/16/2024   CO2 22 08/16/2024   BUN 14 08/16/2024   CREATININE 0.72 08/16/2024   GFRNONAA >60 08/16/2024   CALCIUM  8.6 (L) 08/16/2024   PHOS 1.2 (L) 10/15/2021   PROT 7.4 08/14/2024   ALBUMIN 3.3 (L) 08/14/2024   LABGLOB 2.3 07/17/2017   AGRATIO 1.9 07/17/2017   BILITOT 0.8 08/14/2024   ALKPHOS 53 08/14/2024   AST 18 08/14/2024   ALT 11 08/14/2024   ANIONGAP 9 08/16/2024     Creatinine Clearance: Estimated Creatinine Clearance: 58.2 mL/min (by C-G formula based on SCr of 0.72 mg/dL).  Recent Results (from the past 240 hours)  Culture, blood (Routine x 2)     Status: None (Preliminary result)   Collection Time: 08/14/24 11:10 PM   Specimen: BLOOD LEFT ARM  Result Value Ref Range Status   Specimen Description BLOOD LEFT ARM  Final   Special Requests   Final    BOTTLES DRAWN AEROBIC AND ANAEROBIC Blood Culture adequate volume   Culture   Final    NO GROWTH 2 DAYS Performed at [facility], [address] [place], [place] [address]    Report Status PENDING  Incomplete  Culture, blood (Routine x 2)     Status: None (Preliminary result)   Collection Time: 08/14/24 11:30 PM   Specimen: BLOOD RIGHT HAND  Result Value Ref Range Status   Specimen Description BLOOD RIGHT HAND  Final   Special Requests   Final    BOTTLES DRAWN AEROBIC AND ANAEROBIC Blood Culture adequate volume   Culture   Final    NO GROWTH 2  DAYS Performed at [facility], [address], [place], [place] [address]    Report Status PENDING  Incomplete  Resp panel by RT-PCR (RSV, Flu A&B, Covid) Anterior Nasal Swab     Status: None   Collection Time: 08/14/24 11:43 PM   Specimen: Anterior Nasal Swab  Result Value Ref Range Status   SARS Coronavirus 2 by RT PCR NEGATIVE NEGATIVE Final    Comment: (NOTE) SARS-CoV-2 target nucleic acids are NOT DETECTED.  The SARS-CoV-2 RNA is generally detectable in upper respiratory specimens during the acute phase of infection. The lowest concentration of SARS-CoV-2 viral copies this assay can detect is 138 copies/mL. A negative result does not preclude SARS-Cov-2 infection and should not be used as the sole basis for treatment or other patient management decisions. A negative result may occur with  improper specimen collection/handling, submission of specimen other than nasopharyngeal swab, presence of viral mutation(s) within the areas targeted by this assay, and inadequate number of viral copies(<138 copies/mL). A negative result must be combined with clinical observations, patient history, and epidemiological information. The expected result is Negative.  Fact Sheet for Patients:  BloggerCourse.com  Fact Sheet for Healthcare Providers:  SeriousBroker.it  This test is no t yet approved or cleared by the [place]  FDA and  has been authorized for detection and/or diagnosis of SARS-CoV-2 by FDA under an Emergency Use Authorization (EUA). This EUA will  remain  in effect (meaning this test can be used) for the duration of the COVID-19 declaration under Section 564(b)(1) of the Act, 21 U.S.C.section 360bbb-3(b)(1), unless the authorization is terminated  or revoked sooner.       Influenza A by PCR NEGATIVE NEGATIVE Final   Influenza B by PCR NEGATIVE NEGATIVE Final    Comment: (NOTE) The Xpert Xpress SARS-CoV-2/FLU/RSV plus  assay is intended as an aid in the diagnosis of influenza from Nasopharyngeal swab specimens and should not be used as a sole basis for treatment. Nasal washings and aspirates are unacceptable for Xpert Xpress SARS-CoV-2/FLU/RSV testing.  Fact Sheet for Patients: BloggerCourse.com  Fact Sheet for Healthcare Providers: SeriousBroker.it  This test is not yet approved or cleared by the [place]  FDA and has been authorized for detection and/or diagnosis of SARS-CoV-2 by FDA under an Emergency Use Authorization (EUA). This EUA will remain in effect (meaning this test can be used) for the duration of the COVID-19 declaration under Section 564(b)(1) of the Act, 21 U.S.C. section 360bbb-3(b)(1), unless the authorization is terminated or revoked.     Resp Syncytial Virus by PCR NEGATIVE NEGATIVE Final    Comment: (NOTE) Fact Sheet for Patients: BloggerCourse.com  Fact Sheet for Healthcare Providers: SeriousBroker.it  This test is not yet approved or cleared by the [place]  FDA and has been authorized for detection and/or diagnosis of SARS-CoV-2 by FDA under an Emergency Use Authorization (EUA). This EUA will remain in effect (meaning this test can be used) for the duration of the COVID-19 declaration under Section 564(b)(1) of the Act, 21 U.S.C. section 360bbb-3(b)(1), unless the authorization is terminated or revoked.  Performed at [facility], [address], [place], [place] [address]   Respiratory (~20 pathogens) panel by PCR     Status: None   Collection Time: 08/15/24  1:30 PM   Specimen: Nasopharyngeal Swab; Respiratory  Result Value Ref Range Status   Adenovirus NOT DETECTED NOT DETECTED Final   Coronavirus 229E NOT DETECTED NOT DETECTED Final    Comment: (NOTE) The Coronavirus on the Respiratory Panel, DOES NOT test for the novel  Coronavirus (2019 nCoV)     Coronavirus HKU1 NOT DETECTED NOT DETECTED Final   Coronavirus NL63 NOT DETECTED NOT DETECTED Final   Coronavirus OC43 NOT DETECTED NOT DETECTED Final   Metapneumovirus NOT DETECTED NOT DETECTED Final   Rhinovirus / Enterovirus NOT DETECTED NOT DETECTED Final   Influenza A NOT DETECTED NOT DETECTED Final   Influenza B NOT DETECTED NOT DETECTED Final   Parainfluenza Virus 1 NOT DETECTED NOT DETECTED Final   Parainfluenza Virus 2 NOT DETECTED NOT DETECTED Final   Parainfluenza Virus 3 NOT DETECTED NOT DETECTED Final   Parainfluenza Virus 4 NOT DETECTED NOT DETECTED Final   Respiratory Syncytial Virus NOT DETECTED NOT DETECTED Final   Bordetella pertussis NOT DETECTED NOT DETECTED Final   Bordetella Parapertussis NOT DETECTED NOT DETECTED Final   Chlamydophila pneumoniae NOT DETECTED NOT DETECTED Final   Mycoplasma pneumoniae NOT DETECTED NOT DETECTED Final    Comment: Performed at [facility], [address], [place], [place] [address]      Radiology Studies: DG HIP UNILAT WITH PELVIS 2-3 VIEWS LEFT Result Date: 08/15/2024 EXAM: 2 or more VIEW(S) XRAY OF THE LEFT HIP 08/15/2024 07:28:00 AM COMPARISON: None available. CT chest, abdomen and pelvis from earlier today. CLINICAL HISTORY: Pt with left hip pain; pt able to move leg. FINDINGS: BONES AND JOINTS: No acute fracture or focal osseous lesion.  The hip joint is maintained. Mild bilateral hip osteoarthritis. Osteopenia. SOFT TISSUES: The soft tissues are unremarkable. IMPRESSION: 1. No acute osseous findings. 2. Mild bilateral hip osteoarthritis. Electronically signed by: Waddell Calk MD 08/15/2024 07:44 AM EDT RP Workstation: [id number]   CT CHEST ABDOMEN PELVIS W CONTRAST Result Date: 08/15/2024 EXAM: CT CHEST, ABDOMEN AND PELVIS WITH CONTRAST 08/15/2024 06:31:49 AM TECHNIQUE: CT of the chest, abdomen and pelvis was performed with the administration of intravenous contrast. Multiplanar reformatted images are provided for review.  Automated exposure control, iterative reconstruction, and/or weight based adjustment of the mA/kV was utilized to reduce the radiation dose to as low as reasonably achievable. COMPARISON: CT abdomen and pelvis 07/05/2022 and CT angio chest 10/14/2021. CLINICAL HISTORY: Sepsis, unclear source. ? L basilar infiltrate. recent imaging with question of R pyelo, pelviectasis, ? of prox ureteral stricture, endometrial thickening. increased weakness after being discharged yesterday from [facility] with diagnosis of Pneumonia.Pt wears O2 @ 2L [place] at all times. Family reports CBG of 236 at home. Family states pt like a dishrag and needed staff to help family get pt out of car. FINDINGS: CHEST: MEDIASTINUM AND LYMPH NODES: Heart size is upper limits of normal. No pericardial effusion. No mediastinal or hilar adenopathy. Prominent nonpathologically enlarged lymph nodes within the chest are likely reactive in etiology and appear similar to previous exam. Central airways are patent. LUNGS AND PLEURA: Bilateral posterior pleural thickening with subpleural consolidation or atelectasis noted within the lung bases, left greater than right. Mild diffuse interlobular septal thickening is noted. Central airway thickening with mild prominence of the peribronchovascular interstitium. No pneumothorax identified. Subpleural scarring versus atelectasis within the posterior medial left upper lobe. No suspicious lung nodule or mass. ABDOMEN AND PELVIS: LIVER: The liver is unremarkable. GALLBLADDER AND BILE DUCTS: Gallbladder is unremarkable. No biliary ductal dilatation. SPLEEN: No acute abnormality. PANCREAS: No acute abnormality. ADRENAL GLANDS: No acute abnormality. KIDNEYS, URETERS AND BLADDER: Bilateral simple kidney cysts are identified. The largest cyst arises off the lateral right kidney measuring 4.4 cm, image 76/2. No follow up imaging recommended. There is moderate right-sided hydronephrosis, new from the previous exam. Dilatation of the  right renal pelvis is identified up to the UPJ. The ureter appears normal in caliber without signs of ureteral calculi. Moderate bladder distention without focal bladder abnormality. GI AND BOWEL: Small hiatal hernia. The appendix is visualized and appears normal. Moderate stool burden identified within the cecum and ascending colon. Mild stool burden noted elsewhere in the colon. No pathologic dilatation of the large or small bowel loops. No signs of bowel inflammation. REPRODUCTIVE ORGANS: Calcified uterine fibroid noted. No adnexal mass. PERITONEUM AND RETROPERITONEUM: No free fluid or fluid collections. No signs of free air. VASCULATURE: Aortic atherosclerotic calcification. ABDOMINAL AND PELVIS LYMPH NODES: No signs of abdominal or pelvic adenopathy. BONES AND SOFT TISSUES: Lumbar spondylosis. No acute osseous abnormality. No focal soft tissue abnormality. IMPRESSION: 1. Trace right pleural effusion with bilateral posterior pleural thickening with mild subpleural consolidation/atelectasis, left greater than right. Findings are favored to represent sequelae of inflammation/infection. 2. Thickening of the peribronchovascular interstitium with mild interstitial thickening is nonspecific but may reflect mild interstitial edema. 3. Right-sided hydronephrosis new from the previous exam, with dilatation of the right renal pelvis up to the UPJ. The ureter appears normal in caliber without signs of ureteral calculi. Differential considerations include sequelae of recently passed Kidney Stone reflux, or ascending urinary tract infection. Underlying urothelial lesion is less favored Recommend follow-up imaging to ensure resolution and to exclude  underlying obstructing lesion. 4. Moderate stool burden.  Correlate for signs and symptoms of constipation. Electronically signed by: Waddell Calk MD 08/15/2024 06:54 AM EDT RP Workstation: [id number]   DG Chest Port 1 View if patient is in a treatment room. Result Date:  08/14/2024 EXAM: 1 VIEW XRAY OF THE CHEST 08/14/2024 11:21:20 PM COMPARISON: None available. CLINICAL HISTORY: Suspected Sepsis. Pt brought in by family with increased weakness after being discharged yesterday from [facility] with diagnosis of Pneumonia.Pt wears O2 @ 2L [place] at all times. FINDINGS: LUNGS AND PLEURA: Interval development of a small left pleural effusion with associated left basilar atelectasis. Lungs are otherwise clear. No pneumothorax. No pleural effusion on the right. HEART AND MEDIASTINUM: Cardiac size within normal limits. Pulmonary vascularity is normal. BONES AND SOFT TISSUES: No acute osseous abnormality. IMPRESSION: 1. Interval development of a small left pleural effusion with associated left basilar atelectasis. Electronically signed by: Dorethia Molt MD 08/14/2024 11:28 PM EDT RP Workstation: [id number]      LOS: 1 day    Elgin Lam, MD [facility] 08/16/2024, 12:25 PM   If 7PM-7AM, please contact night-coverage [email or web address]

## 2024-08-17 DIAGNOSIS — J189 Pneumonia, unspecified organism: Secondary | ICD-10-CM | POA: Diagnosis not present

## 2024-08-17 DIAGNOSIS — A419 Sepsis, unspecified organism: Secondary | ICD-10-CM | POA: Diagnosis not present

## 2024-08-17 LAB — CBC
HCT: 27.6 % — ABNORMAL LOW (ref 36.0–46.0)
Hemoglobin: 8.9 g/dL — ABNORMAL LOW (ref 12.0–15.0)
MCH: 29 pg (ref 26.0–34.0)
MCHC: 32.2 g/dL (ref 30.0–36.0)
MCV: 89.9 fL (ref 80.0–100.0)
Platelets: 221 K/uL (ref 150–400)
RBC: 3.07 MIL/uL — ABNORMAL LOW (ref 3.87–5.11)
RDW: 15.9 % — ABNORMAL HIGH (ref 11.5–15.5)
WBC: 7.2 K/uL (ref 4.0–10.5)
nRBC: 0 % (ref 0.0–0.2)

## 2024-08-17 LAB — FERRITIN: Ferritin: 42 ng/mL (ref 11–307)

## 2024-08-17 LAB — IRON AND TIBC
Iron: 13 ug/dL — ABNORMAL LOW (ref 28–170)
Saturation Ratios: 5 % — ABNORMAL LOW (ref 10.4–31.8)
TIBC: 264 ug/dL (ref 250–450)
UIBC: 251 ug/dL

## 2024-08-17 LAB — BASIC METABOLIC PANEL WITH GFR
Anion gap: 9 (ref 5–15)
BUN: 14 mg/dL (ref 8–23)
CO2: 23 mmol/L (ref 22–32)
Calcium: 9.1 mg/dL (ref 8.9–10.3)
Chloride: 104 mmol/L (ref 98–111)
Creatinine, Ser: 0.73 mg/dL (ref 0.44–1.00)
GFR, Estimated: >60 mL/min (ref >60)
Glucose, Bld: 314 mg/dL — ABNORMAL HIGH (ref 70–99)
Potassium: 4.3 mmol/L (ref 3.5–5.1)
Sodium: 136 mmol/L (ref 135–145)

## 2024-08-17 LAB — VITAMIN B12: Vitamin B-12: 300 pg/mL (ref 180–914)

## 2024-08-17 LAB — RETICULOCYTES
Immature Retic Fract: 20.7 % — ABNORMAL HIGH (ref 2.3–15.9)
RBC.: 3.12 MIL/uL — ABNORMAL LOW (ref 3.87–5.11)
Retic Count, Absolute: 64.6 K/uL (ref 19.0–186.0)
Retic Ct Pct: 2.1 % (ref 0.4–3.1)

## 2024-08-17 LAB — GLUCOSE, CAPILLARY
Glucose-Capillary: 316 mg/dL — ABNORMAL HIGH (ref 70–99)
Glucose-Capillary: 247 mg/dL — ABNORMAL HIGH (ref 70–99)
Glucose-Capillary: 204 mg/dL — ABNORMAL HIGH (ref 70–99)
Glucose-Capillary: 278 mg/dL — ABNORMAL HIGH (ref 70–99)

## 2024-08-17 LAB — PROCALCITONIN: Procalcitonin: 0.95 ng/mL

## 2024-08-17 LAB — FOLATE: Folate: 7.9 ng/mL (ref >5.9)

## 2024-08-17 MED ORDER — NYSTATIN 100000 UNIT/GM EX CREA: TOPICAL_CREAM | Freq: Two times a day (BID) | CUTANEOUS | Status: DC

## 2024-08-17 MED ORDER — INSULIN GLARGINE 100 UNIT/ML ~~LOC~~ SOLN
30.0000 [IU] | Freq: Every day | SUBCUTANEOUS | Status: DC
  Filled 2024-08-17: qty 0.3

## 2024-08-17 MED ORDER — INSULIN ASPART 100 UNIT/ML IJ SOLN
11.0000 [IU] | Freq: Three times a day (TID) | INTRAMUSCULAR | Status: DC
  Administered 2024-08-19 (×2): 11 [IU] via SUBCUTANEOUS

## 2024-08-17 MED ORDER — INSULIN GLARGINE 100 UNIT/ML ~~LOC~~ SOLN
40.0000 [IU] | Freq: Every day | SUBCUTANEOUS | Status: DC
  Administered 2024-08-18 – 2024-08-19 (×2): 40 [IU] via SUBCUTANEOUS
  Filled 2024-08-17 (×3): qty 0.4

## 2024-08-17 MED ORDER — CLOTRIMAZOLE 1 % VA CREA
1.0000 | TOPICAL_CREAM | Freq: Every day | VAGINAL | Status: DC
Start: 2024-08-17 — End: 2024-08-23
  Administered 2024-08-17 – 2024-08-18 (×3): 1 via VAGINAL
  Filled 2024-08-17: qty 45

## 2024-08-17 NOTE — Evaluation (Signed)
 Physical Therapy Evaluation Patient Details Name: Gabrielle Wood MRN: [id number] DOB: [birth year] Today's Date: 08/17/2024  History of Present Illness  Gabrielle Wood is a 78 y.o. 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 Per EDP report recurrent pattern of infections and worsening out of the hospital. Somnolent / altered initially on ED evaluation.   Clinical Impression  Pt was able to demonstrate supine to sit transfers with minA to achieve upright sitting. She required modA with sit to stand transfers from EOB and BSC. However, once standing, she only required minA when utilizing the RW to ambulate to the chair. Supplemental oxygen  was utilized throughout the session. She was left in the chair with family present, call bell within reach, and the chair alarm set. Patient will benefit from continued skilled physical therapy in hospital and recommended venue below to increase strength, balance, endurance for safe ADLs and gait.       If plan is discharge home, recommend the following: A little help with walking and/or transfers;A little help with bathing/dressing/bathroom;Assistance with cooking/housework;Help with stairs or ramp for entrance   Can travel by private vehicle   Yes    Equipment Recommendations None recommended by PT  Recommendations for Other Services       Functional Status Assessment Patient has had a recent decline in their functional status and demonstrates the ability to make significant improvements in function in a reasonable and predictable amount of time.     Precautions / Restrictions Precautions Precautions: Fall Recall of Precautions/Restrictions: Intact Restrictions Weight Bearing Restrictions Per Provider Order: No      Mobility  Bed Mobility Overal bed mobility: Needs  Assistance Bed Mobility: Supine to Sit     Supine to sit: Min assist          Transfers Overall transfer level: Needs assistance Equipment used: Rolling walker (2 wheels) Transfers: Sit to/from Stand, Bed to chair/wheelchair/BSC Sit to Stand: Mod assist   Step pivot transfers: Mod assist       General transfer comment: EOB to chair with RW; slow and labored movement    Ambulation/Gait Ambulation/Gait assistance: Min assist Gait Distance (Feet): 5 Feet Assistive device: Rolling walker (2 wheels) Gait Pattern/deviations: Step-through pattern, Decreased stride length Gait velocity: decreased        Stairs            Wheelchair Mobility     Tilt Bed    Modified Rankin (Stroke Patients Only)       Balance Overall balance assessment: Needs assistance Sitting-balance support: No upper extremity supported, Feet supported Sitting balance-Leahy Scale: Fair Sitting balance - Comments: fair to good seated at EOB   Standing balance support: Bilateral upper extremity supported, During functional activity, Reliant on assistive device for balance Standing balance-Leahy Scale: Fair                               Pertinent Vitals/Pain Pain Assessment Pain Assessment: Faces Faces Pain Scale: Hurts a little bit Pain Location: R foot Pain Descriptors / Indicators: Discomfort Pain Intervention(s): Monitored during session, Repositioned    Home Living Family/patient expects to be discharged to:: Private residence Living Arrangements: Children Available Help at Discharge: Family;Available PRN/intermittently Type of Home: House Home Access: Stairs to enter Entrance Stairs-Rails: Right;Left;Can  reach both Entrance Stairs-Number of Steps: 2   Home Layout: One level Home Equipment: Agricultural consultant (2 wheels);Shower seat;Shower seat - built in;Grab bars - tub/shower;Wheelchair - manual Additional Comments: Family reports someong is there during the day. Pt  is only alone at night. Daughter-in-law helps 4 hours a day 7 days a week.    Prior Function Prior Level of Function : Needs assist       Physical Assist : ADLs (physical)   ADLs (physical): Bathing;Dressing;IADLs Mobility Comments: Engineer, technical sales with RW. ADLs Comments: A little assist for bathing, dressing, and assisted for IADL's.     Extremity/Trunk Assessment   Upper Extremity Assessment Upper Extremity Assessment: Defer to OT evaluation    Lower Extremity Assessment Lower Extremity Assessment: Generalized weakness    Cervical / Trunk Assessment Cervical / Trunk Assessment: Kyphotic  Communication   Communication Communication: Impaired Factors Affecting Communication: Hearing impaired    Cognition Arousal: Alert Behavior During Therapy: WFL for tasks assessed/performed   PT - Cognitive impairments: No apparent impairments                       PT - Cognition Comments: unremarkable Following commands: Intact       Cueing Cueing Techniques: Verbal cues, Tactile cues     General Comments      Exercises     Assessment/Plan    PT Assessment Patient needs continued PT services  PT Problem List Decreased strength;Decreased activity tolerance;Decreased balance;Decreased mobility       PT Treatment Interventions DME instruction;Gait training;Stair training;Functional mobility training;Balance training;Therapeutic exercise;Therapeutic activities    PT Goals (Current goals can be found in the Care Plan section)  Acute Rehab PT Goals Patient Stated Goal: Pt will return home PT Goal Formulation: With patient Time For Goal Achievement: 08/24/24 Potential to Achieve Goals: Good    Frequency Min 3X/week     Co-evaluation PT/OT/SLP Co-Evaluation/Treatment: Yes Reason for Co-Treatment: To address functional/ADL transfers PT goals addressed during session: Mobility/safety with mobility;Balance OT goals addressed during session: ADL's and  self-care       AM-PAC PT 6 Clicks Mobility  Outcome Measure Help needed turning from your back to your side while in a flat bed without using bedrails?: A Little Help needed moving from lying on your back to sitting on the side of a flat bed without using bedrails?: A Little Help needed moving to and from a bed to a chair (including a wheelchair)?: A Lot Help needed standing up from a chair using your arms (e.g., wheelchair or bedside chair)?: A Lot Help needed to walk in hospital room?: A Lot Help needed climbing 3-5 steps with a railing? : A Lot 6 Click Score: 14    End of Session   Activity Tolerance: Patient tolerated treatment well Patient left: in chair;with call bell/phone within reach;with chair alarm set   PT Visit Diagnosis: Muscle weakness (generalized) (M62.81);Unsteadiness on feet (R26.81);Other abnormalities of gait and mobility (R26.89)    Time: 9148-9081 PT Time Calculation (min) (ACUTE ONLY): 27 min   Charges:   PT Evaluation $PT Eval Moderate Complexity: 1 Mod PT Treatments $Therapeutic Activity: 23-37 mins PT General Charges $$ ACUTE PT VISIT: 1 Visit        Lacinda Fass, PT, DPT  08/17/2024, 2:15 PM

## 2024-08-17 NOTE — TOC Initial Note (Signed)
 Transition of Care [facility]) - Initial/Assessment Note    Patient Details  Name: Gabrielle Wood MRN: [id number] Date of Birth: [birth year]  Transition of Care [facility]) CM/SW Contact:    [name], LCSWA Phone Number: 08/17/2024, 2:31 PM  Clinical Narrative:                  CSW spoke with son who shared that his sister in law and a friend comes to stay with patient until he gets off work . Son reports that he stays with patient at night . Son reports that patient has a walker. PT recommended SNF , son agreeable and facility choice were [facility] and [facility]. Referral sent via HUB. ICM will continue to follow.    Expected Discharge Plan: Skilled Nursing Facility Barriers to Discharge: Continued Medical Work up, SNF Pending bed offer   Patient Goals and CMS Choice Patient states their goals for this hospitalization and ongoing recovery are:: SNF for short-term rehab CMS Medicare.gov Compare Post Acute Care list provided to:: Patient Represenative (must comment) (SonGLENWOOD Shed) Choice offered to / list presented to : Adult Children Mize ownership interest in [facility].provided to:: Adult Children    Expected Discharge Plan and Services     Post Acute Care Choice: Durable Medical Equipment Living arrangements for the past 2 months: Single Family Home                                      Prior Living Arrangements/Services Living arrangements for the past 2 months: Single Family Home Lives with:: Adult Children, Relatives, Self Patient language and need for interpreter reviewed:: No Do you feel safe going back to the place where you live?: Yes      Need for Family Participation in Patient Care: Yes (Comment) Care giver support system in place?: Yes (comment) Current home services: DME Criminal Activity/Legal Involvement Pertinent to Current Situation/Hospitalization: No - Comment as needed  Activities of Daily Living   ADL Screening (condition at time of  admission) Independently performs ADLs?: No Does the patient have a NEW difficulty with bathing/dressing/toileting/self-feeding that is expected to last >3 days?: Yes (Initiates electronic notice to provider for possible OT consult) Does the patient have a NEW difficulty with getting in/out of bed, walking, or climbing stairs that is expected to last >3 days?: Yes (Initiates electronic notice to provider for possible PT consult) Does the patient have a NEW difficulty with communication that is expected to last >3 days?: No Is the patient deaf or have difficulty hearing?: Yes Does the patient have difficulty seeing, even when wearing glasses/contacts?: No Does the patient have difficulty concentrating, remembering, or making decisions?: No  Permission Sought/Granted      Share Information with NAME: Shed     Permission granted to share info w Relationship: Son     Emotional Assessment Appearance:: Appears stated age     Orientation: : Oriented to Self, Oriented to Place Alcohol  / Substance Use: Not Applicable Psych Involvement: No (comment)  Admission diagnosis:  Sepsis (HCC) [A41.9] Sepsis, due to unspecified organism, unspecified whether acute organ dysfunction present [facility]) [A41.9] Patient Active Problem List   Diagnosis Date Noted   Sepsis (HCC) 08/15/2024   Anxiety 05/28/2023   Vitamin D deficiency 01/31/2022   Pressure injury of skin 10/16/2021   DKA (diabetic ketoacidosis) (HCC) 10/14/2021   Hypomagnesemia 10/14/2021   Hypoalbuminemia 10/14/2021  Community acquired pneumonia 10/14/2021   Renal cyst 10/02/2021   History of colonic polyps 08/27/2018   Taking multiple medications for chronic disease 08/27/2018   Type 2 diabetes mellitus with hypoglycemia without coma, with long-term current use of insulin  (HCC) 09/14/2015   Tobacco abuse counseling 09/14/2015   Rheumatoid arthritis (HCC) 06/08/2015   Diabetes mellitus (HCC) 06/08/2015   Gastritis and duodenitis  04/05/2012   Pancreatitis, acute 04/03/2012   Mixed hyperlipidemia 04/03/2012   Benign hypertension 04/03/2012   Hypothyroidism 04/03/2012   GERD (gastroesophageal reflux disease) 04/03/2012   PCP:  Trudy Vaughn FALCON, MD Pharmacy:   [facility] [place], [place] - [address]  [place] [address] Phone: [phone] Fax: [phone]     Social Drivers of Health (SDOH) Social History: SDOH Screenings   Food Insecurity: No Food Insecurity (08/15/2024)  Housing: Low Risk  (08/15/2024)  Transportation Needs: No Transportation Needs (08/15/2024)  Utilities: Not At Risk (08/15/2024)  Depression (PHQ2-9): High Risk (11/01/2021)  Financial Resource Strain: Low Risk  (08/11/2024)   Received from [facility]  Physical Activity: Inactive (08/11/2024)   Received from [facility]  Social Connections: Socially Isolated (08/15/2024)  Stress: No Stress Concern Present (08/11/2024)   Received from [facility]  Tobacco Use: Medium Risk (08/14/2024)  Health Literacy: Medium Risk (08/11/2024)   Received from [facility]   SDOH Interventions:     Readmission Risk Interventions    08/17/2024    2:30 PM  Readmission Risk Prevention Plan  Medication Screening Complete  Transportation Screening Complete

## 2024-08-17 NOTE — Inpatient Diabetes Management (Signed)
 Inpatient Diabetes Program Recommendations  AACE/ADA: New Consensus Statement on Inpatient Glycemic Control   Target Ranges:  Prepandial:   less than 140 mg/dL      Peak postprandial:   less than 180 mg/dL (1-2 hours)      Critically ill patients:  140 - 180 mg/dL    Latest Reference Range & Units 08/16/24 07:09 08/16/24 11:18 08/16/24 16:09 08/16/24 19:45 08/17/24 07:40  Glucose-Capillary 70 - 99 mg/dL 701 (H) 698 (H) 826 (H) 120 (H) 316 (H)   Review of Glycemic Control  Diabetes history: DM2 Outpatient Diabetes medications: Tresiba  66 units at bedtime, Humalog  8 units TID Current orders for Inpatient glycemic control: Lantus  26 units daily, Novolog  8 units TID with meals, Novolog  0-15 units TID with meals, Novolog  0-5 units QHS  Inpatient Diabetes Program Recommendations:    Insulin : Please consider increasing Lantus  to 30 units daily and meal coverage to Novolog  11 units TID with meals  Thanks, Earnie Gainer, RN, MSN, CDCES Diabetes Coordinator Inpatient Diabetes Program [phone] (Team Pager from 8am to 5pm)

## 2024-08-17 NOTE — NC FL2 (Signed)
 [place]  MEDICAID FL2 LEVEL OF CARE FORM     IDENTIFICATION  Patient Name: Gabrielle Wood Birthdate: Nov 18, 1946 Sex: female Admission Date (Current Location): 08/14/2024  [facility] and [place] Number:  [employer] and Address:  [facility],  [address], [name] [address]      Provider Number: [id number]  Attending Physician Name and Address:  Gabrielle Elgin LABOR, MD  Relative Name and Phone Number:  Gabrielle Wood ( son) [phone]    Current Level of Care: Hospital Recommended Level of Care: Skilled Nursing Facility Prior Approval Number:    Date Approved/Denied:   PASRR Number:    Discharge Plan: SNF    Current Diagnoses: Patient Active Problem List   Diagnosis Date Noted   Sepsis (HCC) 08/15/2024   Anxiety 05/28/2023   Vitamin D deficiency 01/31/2022   Pressure injury of skin 10/16/2021   DKA (diabetic ketoacidosis) (HCC) 10/14/2021   Hypomagnesemia 10/14/2021   Hypoalbuminemia 10/14/2021   Community acquired pneumonia 10/14/2021   Renal cyst 10/02/2021   History of colonic polyps 08/27/2018   Taking multiple medications for chronic disease 08/27/2018   Type 2 diabetes mellitus with hypoglycemia without coma, with long-term current use of insulin  (HCC) 09/14/2015   Tobacco abuse counseling 09/14/2015   Rheumatoid arthritis (HCC) 06/08/2015   Diabetes mellitus (HCC) 06/08/2015   Gastritis and duodenitis 04/05/2012   Pancreatitis, acute 04/03/2012   Mixed hyperlipidemia 04/03/2012   Benign hypertension 04/03/2012   Hypothyroidism 04/03/2012   GERD (gastroesophageal reflux disease) 04/03/2012    Orientation RESPIRATION BLADDER Height & Weight     Self, Place  Normal External catheter Weight: 159 lb 13.3 oz (72.5 kg) Height:  5' 6 (167.6 cm)  BEHAVIORAL SYMPTOMS/MOOD NEUROLOGICAL BOWEL NUTRITION STATUS      Continent Diet (Carb)  AMBULATORY STATUS COMMUNICATION OF NEEDS Skin   Extensive Assist Verbally Normal                        Personal Care Assistance Level of Assistance  Bathing, Feeding, Dressing Bathing Assistance: Limited assistance Feeding assistance: Independent Dressing Assistance: Limited assistance     Functional Limitations Info  Hearing, Speech, Sight Sight Info: Impaired Hearing Info: Impaired Speech Info: Adequate    SPECIAL CARE FACTORS FREQUENCY  PT (By licensed PT), OT (By licensed OT)     PT Frequency: 5 x a week OT Frequency: 5 x a week            Contractures Contractures Info: Not present    Additional Factors Info  Code Status, Allergies Code Status Info: DNR-Limited Allergies Info: Amlodipine , Ciprocin-fluocin-procin (fluocinolone), Lipitor (atorvastatin ), Nebivolol, Nitrofuran Derivatives, Lisinopril, Red yeast rice ( cholestin)           Current Medications (08/17/2024):  This is the current hospital active medication list Current Facility-Administered Medications  Medication Dose Route Frequency Provider Last Rate Last Admin   acetaminophen  (TYLENOL ) tablet 650 mg  650 mg Oral Q6H PRN Gabrielle Elgin LABOR, MD   650 mg at 08/17/24 1055   ALPRAZolam  (XANAX ) tablet 0.25 mg  0.25 mg Oral QHS PRN Wood, Jonathan, MD   0.25 mg at 08/16/24 2207   aspirin  EC tablet 81 mg  81 mg Oral Daily Wood, Jonathan, MD   81 mg at 08/17/24 0839   atorvastatin  (LIPITOR) tablet 80 mg  80 mg Oral QPM Wood, Jonathan, MD   80 mg at 08/16/24 1658   ceFEPIme  (MAXIPIME ) 2 g in sodium chloride  0.9 %  100 mL IVPB  2 g Intravenous Q12H Wood, Jonathan, MD 200 mL/hr at 08/17/24 1238 2 g at 08/17/24 1238   clotrimazole  (GYNE-LOTRIMIN ) vaginal cream 1 Applicatorful  1 Applicatorful Vaginal QHS Gabrielle Elgin LABOR, MD   1 Applicatorful at 08/17/24 9771   gabapentin  (NEURONTIN ) capsule 100 mg  100 mg Oral TID Wood, Jonathan, MD   100 mg at 08/17/24 0839   HYDROcodone -acetaminophen  (NORCO/VICODIN) 5-325 MG per tablet 1 tablet  1 tablet Oral Q6H PRN Gabrielle Elgin LABOR, MD   1 tablet at 08/16/24 9075    insulin  aspart (novoLOG ) injection 0-15 Units  0-15 Units Subcutaneous TID WC Wood, Gabrielle Daniel, MD   5 Units at 08/17/24 1235   insulin  aspart (novoLOG ) injection 0-5 Units  0-5 Units Subcutaneous QHS Wood, Gabrielle Daniel, MD   5 Units at 08/15/24 2121   insulin  aspart (novoLOG ) injection 11 Units  11 Units Subcutaneous TID WC Gabrielle Elgin LABOR, MD       [START ON 08/18/2024] insulin  glargine (LANTUS ) injection 40 Units  40 Units Subcutaneous Daily Gabrielle Elgin LABOR, MD       ipratropium-albuterol  (DUONEB) 0.5-2.5 (3) MG/3ML nebulizer solution 3 mL  3 mL Nebulization Q6H WA Gabrielle Elgin LABOR, MD   3 mL at 08/17/24 1418   levothyroxine  (SYNTHROID ) tablet 137 mcg  137 mcg Oral Q0600 Wood, Jonathan, MD   137 mcg at 08/17/24 0538   liver oil-zinc  oxide (DESITIN) 40 % ointment   Topical BID Wood, Gabrielle Daniel, MD   Given at 08/17/24 1056   metoprolol  succinate (TOPROL -XL) 24 hr tablet 25 mg  25 mg Oral QPM Wood, Jonathan, MD   25 mg at 08/16/24 1658   pantoprazole  (PROTONIX ) EC tablet 40 mg  40 mg Oral Daily Wood, Jonathan, MD   40 mg at 08/17/24 0839   polyethylene glycol (MIRALAX  / GLYCOLAX ) packet 17 g  17 g Oral Daily Gabrielle Elgin LABOR, MD   17 g at 08/17/24 9161   sodium chloride  flush (NS) 0.9 % injection 3 mL  3 mL Intravenous Q12H Wood, Jonathan, MD   3 mL at 08/16/24 1240   ticagrelor  (BRILINTA ) tablet 90 mg  90 mg Oral BID Wood, Jonathan, MD   90 mg at 08/17/24 1059   vancomycin  (VANCOREADY) IVPB 1250 mg/250 mL  1,250 mg Intravenous Q24H Gabrielle Wood, RPH 166.7 mL/hr at 08/16/24 2210 1,250 mg at 08/16/24 2210     Discharge Medications: Please see discharge summary for a list of discharge medications.  Relevant Imaging Results:  Relevant Lab Results:   Additional Information SSN: [id number]  Gabrielle Wood, [place]

## 2024-08-17 NOTE — Evaluation (Signed)
 Clinical/Bedside Swallow Evaluation Patient Details  Name: Gabrielle Wood MRN: [id number] Date of Birth: 12-09-1945  Today's Date: 08/17/2024 Time: SLP Start Time (ACUTE ONLY): 1515 SLP Stop Time (ACUTE ONLY): 1544 SLP Time Calculation (min) (ACUTE ONLY): 29 min  Past Medical History:  Past Medical History:  Diagnosis Date   Arthritis    COPD (chronic obstructive pulmonary disease) (HCC)    Diabetes mellitus    DVT (deep venous thrombosis) (HCC)    years ago   GERD (gastroesophageal reflux disease)    H. pylori infection 2013   S/p treatment with amoxicillin , Biaxin, PPI   High cholesterol    Hypertension    Hypothyroidism    NSTEMI (non-ST elevated myocardial infarction) (HCC) 12/2020   s/p  PCI DES to the LCx   Thyroid  disease    Past Surgical History:  Past Surgical History:  Procedure Laterality Date   BACK SURGERY     lumbar x3    ESOPHAGOGASTRODUODENOSCOPY  04/04/2012   Moderate gastritis/MODERATE Duodenitis in the duodenal bulb duodenum.  Pathology with chronic H. pylori gastritis.   EUS  05/14/2012   Normal pancreas, gallbladder,biliary tree/ Incidental finding of simple-appearing right renal cyst.   TONSILLECTOMY     TUBAL LIGATION     VEIN SURGERY     stripping years ago but recent laser surgery; bilateral legs   HPI:  Gabrielle Wood is a 78 y.o. female with a history of COPD, chronic respiratory failure, diabetes mellitus type 2, hypertension, hyperlipidemia, hypothyroidism, GERD.  Patient presented secondary to weakness and failure to thrive and found to have concern for sepsis with evidence of pneumonia. Empiric antibiotics started. Chest xray: Interval development of a small left pleural effusion with associated left  basilar atelectasis. Pt with recent hospitalizations at [facility] and had BSE completed 08/12/24 with recommendation for regular/thin. BSE requested.    Assessment / Plan / Recommendation  Clinical Impression  Clinical swallow evaluation  completed at bedside with family present. Pt denies difficulty swallowing and recently was assessed at [facility] via BSE and reg/thin was recommended. Oral motor exam is WNL. Pt did not exhibit any signs or symptoms of aspiration and no reports of globus. Recommend regular textures and thin liquids with standard aspiration and reflux precautions. No further SLP services indicated at this time. SLP will sign off. SLP Visit Diagnosis: Dysphagia, unspecified (R13.10)    Aspiration Risk  No limitations    Diet Recommendation Regular;Thin liquid    Liquid Administration via: Cup;Straw Medication Administration: Whole meds with liquid Supervision: Patient able to self feed Postural Changes: Seated upright at 90 degrees;Remain upright for at least 30 minutes after po intake    Other  Recommendations Oral Care Recommendations: Oral care BID     Assistance Recommended at Discharge    Functional Status Assessment Patient has not had a recent decline in their functional status  Frequency and Duration            Prognosis Prognosis for improved oropharyngeal function: Good      Swallow Study   General Date of Onset: 08/14/24 HPI: Gabrielle Wood is a 78 y.o. female with a history of COPD, chronic respiratory failure, diabetes mellitus type 2, hypertension, hyperlipidemia, hypothyroidism, GERD.  Patient presented secondary to weakness and failure to thrive and found to have concern for sepsis with evidence of pneumonia. Empiric antibiotics started. Chest xray: Interval development of a small left pleural effusion with associated left  basilar atelectasis. Pt with recent hospitalizations at [facility]  and had BSE completed 08/12/24 with recommendation for regular/thin. BSE requested. Type of Study: Bedside Swallow Evaluation Previous Swallow Assessment: BSE 08/12/24 reg/thin at [facility] Diet Prior to this Study: Regular;Thin liquids (Level 0) Temperature Spikes Noted: No Respiratory Status: Room  air History of Recent Intubation: No Behavior/Cognition: Alert;Cooperative;Pleasant mood Oral Cavity Assessment: Within Functional Limits Oral Care Completed by SLP: Yes Oral Cavity - Dentition: Adequate natural dentition Vision: Functional for self-feeding Self-Feeding Abilities: Able to feed self Patient Positioning: Upright in bed Baseline Vocal Quality: Normal Volitional Cough: Strong Volitional Swallow: Able to elicit    Oral/Motor/Sensory Function Overall Oral Motor/Sensory Function: Within functional limits   Ice Chips Ice chips: Within functional limits Presentation: Spoon   Thin Liquid Thin Liquid: Within functional limits Presentation: Cup;Self Fed;Straw    Nectar Thick Nectar Thick Liquid: Not tested   Honey Thick Honey Thick Liquid: Not tested   Puree Puree: Within functional limits Presentation: Spoon   Solid     Solid: Within functional limits Presentation: Self Fed     Thank you,  Lamar Candy, CCC-SLP [phone]  Awesome Jared 08/17/2024,3:59 PM

## 2024-08-17 NOTE — Plan of Care (Signed)
  Problem: Acute Rehab PT Goals(only PT should resolve) Goal: Pt Will Go Supine/Side To Sit Outcome: Progressing Flowsheets (Taken 08/17/2024 1426) Pt will go Supine/Side to Sit: with supervision Goal: Patient Will Transfer Sit To/From Stand Outcome: Progressing Flowsheets (Taken 08/17/2024 1426) Patient will transfer sit to/from stand: with minimal assist Goal: Pt Will Transfer Bed To Chair/Chair To Bed Outcome: Progressing Flowsheets (Taken 08/17/2024 1426) Pt will Transfer Bed to Chair/Chair to Bed: with contact guard assist Goal: Pt Will Ambulate Outcome: Progressing Flowsheets (Taken 08/17/2024 1426) Pt will Ambulate:  15 feet  with minimal assist  with rolling walker   Lacinda Fass, PT, DPT

## 2024-08-17 NOTE — Progress Notes (Addendum)
 Nurse at bedside,patient alert to person,and place,confused to time,a little confused to situation.Patient  hard of hearing. Blood pressure was 177/77,heart rate 91,Capillary blood glucose was 316 this am,Dr Elgin Lam , notified. Plan of care on going.

## 2024-08-17 NOTE — Plan of Care (Signed)
  Problem: Acute Rehab OT Goals (only OT should resolve) Goal: Pt. Will Perform Grooming Flowsheets (Taken 08/17/2024 1346) Pt Will Perform Grooming:  with contact guard assist  standing Goal: Pt. Will Perform Lower Body Dressing Flowsheets (Taken 08/17/2024 1346) Pt Will Perform Lower Body Dressing:  with modified independence  sitting/lateral leans Goal: Pt. Will Transfer To Toilet Flowsheets (Taken 08/17/2024 1346) Pt Will Transfer to Toilet:  with modified independence  ambulating Goal: Pt. Will Perform Toileting-Clothing Manipulation Flowsheets (Taken 08/17/2024 1346) Pt Will Perform Toileting - Clothing Manipulation and hygiene:  with modified independence  sitting/lateral leans  sit to/from stand Goal: Pt/Caregiver Will Perform Home Exercise Program Flowsheets (Taken 08/17/2024 1346) Pt/caregiver will Perform Home Exercise Program:  Increased ROM  Increased strength  Both right and left upper extremity  Independently  Kirklin Mcduffee OT, MOT

## 2024-08-17 NOTE — Progress Notes (Signed)
 PROGRESS NOTE    Gabrielle Wood  FMW:[id number] DOB: [birth year] DOA: 08/14/2024 PCP: Trudy Vaughn FALCON, MD   Brief Narrative: Gabrielle Wood is a 78 y.o. female with a history of COPD, chronic respiratory failure, diabetes mellitus type 2, hypertension, hyperlipidemia, hypothyroidism, GERD.  Patient presented secondary to weakness and failure to thrive and found to have concern for sepsis with evidence of pneumonia. Empiric antibiotics started.   Assessment/Plan:  Sepsis Present on admission. Likely secondary to pneumonia; urinalysis does not suggest infection. Lactic acidosis with lactic acid of 2.7 down to 1.9. IV fluids started, blood cultures obtained and empiric Vancomycin , Cefepime  and Flagyl  started. Flagyl  discontinued. Negative blood cultures to date.  Community acquired pneumonia CT imaging consistent with bilateral posterior pleural thickening with mild subpleural consolidation/atelectasis. Associated trace pleural effusion noted. With presenting clinical picture, likely infectious etiology, consistent with pneumonia. Procalcitonin elevated at 0.98 with peak of 1.42, now down to 0.95. -Continue Vancomycin  and Cefepime ; transition to oral for discharge. Treat for a total of 5 days.  AKI Baseline creatinine of about 0.6 to 0.8. Creatinine of 1.17 on admission. IV fluids started. CT imaging identifies right-sided hydronephrosis. Renal function improved to baseline.  Failure to thrive Noted. PT/OT consulted for evaluation; still pending.  Left hip pain No precipitating injury. Hip x-ray obtained and is significant for mild hip osteoarthritis. -Analgesics as needed -Outpatient orthopedic surgery follow-up as needed  Acute metabolic encephalopathy Likely related to underlying infection. Resolved.  COPD No evidence of exacerbation.  Constipation -MiraLAX   Chronic respiratory failure with hypoxia Patient is on 2 L/min baseline oxygen  use. Stable.  CAD No chest  pain. History of stent placement and currently on dual antiplatelet therapy with aspirin  and Brilinta . Patient is also on Lipitor. -Continue aspirin , Brilinta , Lipitor  Diabetes mellitus type 2 Uncontrolled with hyperglycemia based on last hemoglobin A1C of 10.4%. Patient is on Tresiba  66 units at bedtime and SSI. Patient started on Insulin  glargine 26 units this admission, in addition to Novolog  meal coverage and sliding scale. -Increase to insulin  glargine 40 units -Increase to Novolog  11 units TID with meals -Continue SSI  Primary hypertension -Continue metoprolol  succinate  Anemia Unclear if this is chronic or acute. Last hemoglobin of 13.5 from one year prior. Hemoglobin of 11.3 on admission, but this is possibly related to some hemoconcentration. Hemoglobin down to 8.5 today. No evidence of bleeding. Normal bilirubin. No associated leukopenia or thrombocytopenia. Anemia panel significant for evidence of iron deficiency; unclear cause. -Iron supplementation -Outpatient GI follow-up  Hyperlipidemia -Continue Lipitor  Hypothyroidism -Continue Synthroid  135 mcg (discontinue 175 mcg dose on discharge)  GERD -Continue Protonix   Anxiety -Continue Xanax  as needed  Chronic pain Neuropathy -Continue gabapentin    DVT prophylaxis: SCDs Code Status:   Code Status: Limited: Do not attempt resuscitation (DNR) -DNR-LIMITED -Do Not Intubate/DNI  Family Communication: None at bedside. Called son, but no response. Disposition Plan: Discharge pending transition to outpatient antibiotics, improvement of symptoms and blood culture data. Likely medically stable for discharge tomorrow.   Consultants:  Palliative care medicine  Procedures:  None  Antimicrobials: Vancomycin  Cefepime  Flagyl     Subjective: Feeling better this morning than yesterday. No cough.  Objective: BP (!) 177/77 (BP Location: Left Arm)   Pulse 91   Temp 98.3 F (36.8 C) (Oral)   Resp 15   Ht 5' 6 (1.676  m)   Wt 72.5 kg   SpO2 97%   BMI 25.80 kg/m   Examination:  General exam: Appears calm and  comfortable Respiratory system: Mild right sided rales. Respiratory effort normal. Cardiovascular system: S1 & S2 heard, RRR. PVCs Gastrointestinal system: Abdomen is nondistended, soft and nontender. Normal bowel sounds heard. Central nervous system: Alert and oriented. No focal neurological deficits. Psychiatry: Judgement and insight appear normal. Mood & affect appropriate.    Data Reviewed: I have personally reviewed following labs and imaging studies   Last CBC Lab Results  Component Value Date   WBC 7.2 08/17/2024   HGB 8.9 (L) 08/17/2024   HCT 27.6 (L) 08/17/2024   MCV 89.9 08/17/2024   MCH 29.0 08/17/2024   RDW 15.9 (H) 08/17/2024   PLT 221 08/17/2024     Last metabolic panel Lab Results  Component Value Date   GLUCOSE 314 (H) 08/17/2024   NA 136 08/17/2024   K 4.3 08/17/2024   CL 104 08/17/2024   CO2 23 08/17/2024   BUN 14 08/17/2024   CREATININE 0.73 08/17/2024   GFRNONAA >60 08/17/2024   CALCIUM  9.1 08/17/2024   PHOS 1.2 (L) 10/15/2021   PROT 7.4 08/14/2024   ALBUMIN 3.3 (L) 08/14/2024   LABGLOB 2.3 07/17/2017   AGRATIO 1.9 07/17/2017   BILITOT 0.8 08/14/2024   ALKPHOS 53 08/14/2024   AST 18 08/14/2024   ALT 11 08/14/2024   ANIONGAP 9 08/17/2024     Creatinine Clearance: Estimated Creatinine Clearance: 58.2 mL/min (by C-G formula based on SCr of 0.73 mg/dL).  Recent Results (from the past 240 hours)  Culture, blood (Routine x 2)     Status: None (Preliminary result)   Collection Time: 08/14/24 11:10 PM   Specimen: BLOOD LEFT ARM  Result Value Ref Range Status   Specimen Description BLOOD LEFT ARM  Final   Special Requests   Final    BOTTLES DRAWN AEROBIC AND ANAEROBIC Blood Culture adequate volume   Culture   Final    NO GROWTH 3 DAYS Performed at [facility], [address], [place], [place] [address]    Report Status PENDING  Incomplete   Culture, blood (Routine x 2)     Status: None (Preliminary result)   Collection Time: 08/14/24 11:30 PM   Specimen: BLOOD RIGHT HAND  Result Value Ref Range Status   Specimen Description BLOOD RIGHT HAND  Final   Special Requests   Final    BOTTLES DRAWN AEROBIC AND ANAEROBIC Blood Culture adequate volume   Culture   Final    NO GROWTH 3 DAYS Performed at [facility], [address] [place], [place] [address]    Report Status PENDING  Incomplete  Resp panel by RT-PCR (RSV, Flu A&B, Covid) Anterior Nasal Swab     Status: None   Collection Time: 08/14/24 11:43 PM   Specimen: Anterior Nasal Swab  Result Value Ref Range Status   SARS Coronavirus 2 by RT PCR NEGATIVE NEGATIVE Final    Comment: (NOTE) SARS-CoV-2 target nucleic acids are NOT DETECTED.  The SARS-CoV-2 RNA is generally detectable in upper respiratory specimens during the acute phase of infection. The lowest concentration of SARS-CoV-2 viral copies this assay can detect is 138 copies/mL. A negative result does not preclude SARS-Cov-2 infection and should not be used as the sole basis for treatment or other patient management decisions. A negative result may occur with  improper specimen collection/handling, submission of specimen other than nasopharyngeal swab, presence of viral mutation(s) within the areas targeted by this assay, and inadequate number of viral copies(<138 copies/mL). A negative result must be combined with clinical observations, patient history, and  epidemiological information. The expected result is Negative.  Fact Sheet for Patients:  BloggerCourse.com  Fact Sheet for Healthcare Providers:  SeriousBroker.it  This test is no t yet approved or cleared by the [place]  FDA and  has been authorized for detection and/or diagnosis of SARS-CoV-2 by FDA under an Emergency Use Authorization (EUA). This EUA will remain  in effect (meaning this test can  be used) for the duration of the COVID-19 declaration under Section 564(b)(1) of the Act, 21 U.S.C.section 360bbb-3(b)(1), unless the authorization is terminated  or revoked sooner.       Influenza A by PCR NEGATIVE NEGATIVE Final   Influenza B by PCR NEGATIVE NEGATIVE Final    Comment: (NOTE) The Xpert Xpress SARS-CoV-2/FLU/RSV plus assay is intended as an aid in the diagnosis of influenza from Nasopharyngeal swab specimens and should not be used as a sole basis for treatment. Nasal washings and aspirates are unacceptable for Xpert Xpress SARS-CoV-2/FLU/RSV testing.  Fact Sheet for Patients: BloggerCourse.com  Fact Sheet for Healthcare Providers: SeriousBroker.it  This test is not yet approved or cleared by the [place]  FDA and has been authorized for detection and/or diagnosis of SARS-CoV-2 by FDA under an Emergency Use Authorization (EUA). This EUA will remain in effect (meaning this test can be used) for the duration of the COVID-19 declaration under Section 564(b)(1) of the Act, 21 U.S.C. section 360bbb-3(b)(1), unless the authorization is terminated or revoked.     Resp Syncytial Virus by PCR NEGATIVE NEGATIVE Final    Comment: (NOTE) Fact Sheet for Patients: BloggerCourse.com  Fact Sheet for Healthcare Providers: SeriousBroker.it  This test is not yet approved or cleared by the [place]  FDA and has been authorized for detection and/or diagnosis of SARS-CoV-2 by FDA under an Emergency Use Authorization (EUA). This EUA will remain in effect (meaning this test can be used) for the duration of the COVID-19 declaration under Section 564(b)(1) of the Act, 21 U.S.C. section 360bbb-3(b)(1), unless the authorization is terminated or revoked.  Performed at [facility], [address] [place], [place] [address]   Respiratory (~20 pathogens) panel by PCR     Status:  None   Collection Time: 08/15/24  1:30 PM   Specimen: Nasopharyngeal Swab; Respiratory  Result Value Ref Range Status   Adenovirus NOT DETECTED NOT DETECTED Final   Coronavirus 229E NOT DETECTED NOT DETECTED Final    Comment: (NOTE) The Coronavirus on the Respiratory Panel, DOES NOT test for the novel  Coronavirus (2019 nCoV)    Coronavirus HKU1 NOT DETECTED NOT DETECTED Final   Coronavirus NL63 NOT DETECTED NOT DETECTED Final   Coronavirus OC43 NOT DETECTED NOT DETECTED Final   Metapneumovirus NOT DETECTED NOT DETECTED Final   Rhinovirus / Enterovirus NOT DETECTED NOT DETECTED Final   Influenza A NOT DETECTED NOT DETECTED Final   Influenza B NOT DETECTED NOT DETECTED Final   Parainfluenza Virus 1 NOT DETECTED NOT DETECTED Final   Parainfluenza Virus 2 NOT DETECTED NOT DETECTED Final   Parainfluenza Virus 3 NOT DETECTED NOT DETECTED Final   Parainfluenza Virus 4 NOT DETECTED NOT DETECTED Final   Respiratory Syncytial Virus NOT DETECTED NOT DETECTED Final   Bordetella pertussis NOT DETECTED NOT DETECTED Final   Bordetella Parapertussis NOT DETECTED NOT DETECTED Final   Chlamydophila pneumoniae NOT DETECTED NOT DETECTED Final   Mycoplasma pneumoniae NOT DETECTED NOT DETECTED Final    Comment: Performed at [facility], [address], [place], [place] [address]      Radiology  Studies: No results found.     LOS: 2 days    Elgin Lam, MD [facility] 08/17/2024, 8:43 AM   If 7PM-7AM, please contact night-coverage [email or web address]

## 2024-08-17 NOTE — Evaluation (Signed)
 Occupational Therapy Evaluation Patient Details Name: Gabrielle Wood MRN: [id number] DOB: 1946-07-25 Today's Date: 08/17/2024   History of Present Illness   Gabrielle Wood is a 78 y.o. 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 Per EDP report recurrent pattern of infections and worsening out of the hospital. Somnolent / altered initially on ED evaluation. (per MD)     Clinical Impressions Pt agreeable to OT and PT co-evaluation. Pt is assisted some for ADL's at baseline and is able to ambulate with the RW minimally in the home. Today pt requried min A for bed mobility and min to mod A for EOB to chair transfer and very short distance ambulation in the room. Pt on supplemental O2 throughout the session. Pt demonstrates level of CGA for most lower body dressing tasks but more may be needed for toileting. Pt demonstrates weak B UE with mild A/ROM deficits for shoulder flexion. Pt left in the chair with family present, call bell within reach, and chair alarm set. Pt will benefit from continued OT in the hospital to increase strength, balance, and endurance for safe ADL's.        If plan is discharge home, recommend the following:   A little help with bathing/dressing/bathroom;A lot of help with walking and/or transfers;Assistance with cooking/housework;Assist for transportation;Help with stairs or ramp for entrance     Functional Status Assessment   Patient has had a recent decline in their functional status and demonstrates the ability to make significant improvements in function in a reasonable and predictable amount of time.     Equipment Recommendations   None recommended by OT             Precautions/Restrictions   Precautions Precautions: Fall Recall of Precautions/Restrictions:  Intact Restrictions Weight Bearing Restrictions Per Provider Order: No     Mobility Bed Mobility Overal bed mobility: Needs Assistance Bed Mobility: Supine to Sit     Supine to sit: Min assist          Transfers Overall transfer level: Needs assistance Equipment used: Rolling walker (2 wheels) Transfers: Sit to/from Stand, Bed to chair/wheelchair/BSC Sit to Stand: Mod assist     Step pivot transfers: Min assist, Mod assist     General transfer comment: EOB to chair with RW; labored movement      Balance Overall balance assessment: Needs assistance Sitting-balance support: No upper extremity supported, Feet supported Sitting balance-Leahy Scale: Fair Sitting balance - Comments: fair to good seated at EOB   Standing balance support: Bilateral upper extremity supported, During functional activity, Reliant on assistive device for balance Standing balance-Leahy Scale: Fair Standing balance comment: using RW                           ADL either performed or assessed with clinical judgement   ADL Overall ADL's : Needs assistance/impaired     Grooming: Set up;Sitting   Upper Body Bathing: Set up;Sitting;Minimal assistance   Lower Body Bathing: Sitting/lateral leans;Contact guard assist;Minimal assistance   Upper Body Dressing : Set up;Sitting   Lower Body Dressing: Contact guard assist;Supervision/safety;Sitting/lateral leans Lower Body Dressing Details (indicate cue type and reason): Able to doff and don sock seated in the chair. Toilet Transfer: Minimal assistance;Moderate assistance;Stand-pivot;Ambulation;Rolling walker (2 wheels) Toilet Transfer Details (indicate cue  type and reason): EOB to chair and ambulation in the room with RW Toileting- Clothing Manipulation and Hygiene: Minimal assistance;Contact guard assist;Sitting/lateral lean       Functional mobility during ADLs: Minimal assistance;Rolling walker (2 wheels) General ADL Comments: Pt  able to ambulate ~8 feet total forward and backward in the room with the RW.     Vision Baseline Vision/History: 1 Wears glasses Ability to See in Adequate Light: 1 Impaired Patient Visual Report: No change from baseline Vision Assessment?: No apparent visual deficits;Wears glasses for reading     Perception Perception: Not tested       Praxis Praxis: Not tested       Pertinent Vitals/Pain Pain Assessment Pain Assessment: Faces Faces Pain Scale: Hurts a little bit Pain Location: R foot Pain Descriptors / Indicators: Discomfort Pain Intervention(s): Monitored during session, Repositioned     Extremity/Trunk Assessment Upper Extremity Assessment Upper Extremity Assessment: Generalized weakness (3-/5 shoulder flexion bilaterally; generally weak.)   Lower Extremity Assessment Lower Extremity Assessment: Defer to PT evaluation   Cervical / Trunk Assessment Cervical / Trunk Assessment: Kyphotic   Communication Communication Communication: Impaired Factors Affecting Communication: Hearing impaired   Cognition Arousal: Alert Behavior During Therapy: WFL for tasks assessed/performed Cognition: No apparent impairments             OT - Cognition Comments: Oriented and able to follow commands well. Very hard of hearing.                 Following commands: Intact       Cueing  General Comments   Cueing Techniques: Verbal cues;Tactile cues                 Home Living Family/patient expects to be discharged to:: Private residence Living Arrangements: Children Available Help at Discharge: Family;Available PRN/intermittently Type of Home: House Home Access: Stairs to enter [employer] of Steps: 2 Entrance Stairs-Rails: Right;Left;Can reach both Home Layout: One level     Bathroom Shower/Tub: Chief Strategy Officer: Handicapped height Bathroom Accessibility: Yes How Accessible: Accessible via wheelchair;Accessible via  walker Home Equipment: Rolling Walker (2 wheels);Shower seat;Shower seat - built in;Grab bars - tub/shower;Wheelchair - manual   Additional Comments: Family reports someong is there during the day. Pt is only alone at night. Daughter-in-law helps 4 hours a day 7 days a week.      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 ADLs Comments: A little assist for bathing, dressing, and assisted for IADL's.    OT Problem List: Decreased strength;Decreased range of motion;Decreased activity tolerance;Impaired balance (sitting and/or standing)   OT Treatment/Interventions: Self-care/ADL training;Therapeutic exercise;Therapeutic activities;Patient/family education      OT Goals(Current goals can be found in the care plan section)   Acute Rehab OT Goals Patient Stated Goal: return home OT Goal Formulation: With patient Time For Goal Achievement: 08/31/24 Potential to Achieve Goals: Fair   OT Frequency:  Min 3X/week    Co-evaluation PT/OT/SLP Co-Evaluation/Treatment: Yes Reason for Co-Treatment: To address functional/ADL transfers   OT goals addressed during session: ADL's and self-care                       End of Session Equipment Utilized During Treatment: Rolling walker (2 wheels);Gait belt  Activity Tolerance: Patient tolerated treatment well Patient left: in chair;with call bell/phone within  reach;with chair alarm set  OT Visit Diagnosis: Unsteadiness on feet (R26.81);Other abnormalities of gait and mobility (R26.89);Muscle weakness (generalized) (M62.81)                Time: 9077-9049 OT Time Calculation (min): 28 min Charges:  OT General Charges $OT Visit: 1 Visit OT Evaluation $OT Eval Low Complexity: 1 Low  Hulen Mandler OT, MOT  Jayson Person 08/17/2024, 1:43 PM

## 2024-08-17 NOTE — TOC CM/SW Note (Signed)
30 Day Note  To whom it May Concern: Please be advised that the above name patient will require a short-term nursing home stay- anticipated 30 days or less rehabilitation and strengthening. The plan is for return home.

## 2024-08-17 NOTE — Progress Notes (Signed)
 Mobility Specialist Progress Note:    08/17/24 1340  Mobility  Activity Ambulated with assistance  Level of Assistance Minimal assist, patient does 75% or more  Assistive Device Front wheel walker  Distance Ambulated (ft) 7 ft  Range of Motion/Exercises Active;All extremities  Activity Response Tolerated well  Mobility Referral Yes  Mobility visit 1 Mobility  Mobility Specialist Start Time (ACUTE ONLY) 1340  Mobility Specialist Stop Time (ACUTE ONLY) 1400  Mobility Specialist Time Calculation (min) (ACUTE ONLY) 20 min   Pt received in bed, agreeable to mobility. Required MinA to stand and ambulate with RW. Once standing pt had bowel movement, after NT finished peri care pt stated she was to tired to continue session. SpO2 95% on RA at rest, SpO2 93-95% while ambulating. Returned supine, all needs met.  Nazareth Norenberg Mobility Specialist Please contact via Special educational needs teacher or  Rehab office at [phone]

## 2024-08-18 DIAGNOSIS — J449 Chronic obstructive pulmonary disease, unspecified: Secondary | ICD-10-CM

## 2024-08-18 DIAGNOSIS — Z515 Encounter for palliative care: Secondary | ICD-10-CM

## 2024-08-18 DIAGNOSIS — Z789 Other specified health status: Secondary | ICD-10-CM

## 2024-08-18 DIAGNOSIS — Z7189 Other specified counseling: Secondary | ICD-10-CM

## 2024-08-18 DIAGNOSIS — Z66 Do not resuscitate: Secondary | ICD-10-CM

## 2024-08-18 LAB — GLUCOSE, CAPILLARY
Glucose-Capillary: 274 mg/dL — ABNORMAL HIGH (ref 70–99)
Glucose-Capillary: 350 mg/dL — ABNORMAL HIGH (ref 70–99)
Glucose-Capillary: 300 mg/dL — ABNORMAL HIGH (ref 70–99)
Glucose-Capillary: 327 mg/dL — ABNORMAL HIGH (ref 70–99)

## 2024-08-18 MED ORDER — ALPRAZOLAM 0.25 MG PO TABS
0.2500 mg | ORAL_TABLET | Freq: Every day | ORAL | Status: DC
  Administered 2024-08-18: 0.25 mg via ORAL
  Filled 2024-08-18: qty 1

## 2024-08-18 MED ORDER — ONDANSETRON HCL 4 MG/2ML IJ SOLN
4.0000 mg | Freq: Four times a day (QID) | INTRAMUSCULAR | Status: DC | PRN
  Administered 2024-08-18: 4 mg via INTRAVENOUS
  Filled 2024-08-18: qty 2

## 2024-08-18 MED ORDER — IPRATROPIUM-ALBUTEROL 0.5-2.5 (3) MG/3ML IN SOLN
3.0000 mL | Freq: Three times a day (TID) | RESPIRATORY_TRACT | Status: DC
  Administered 2024-08-18 – 2024-08-19 (×4): 3 mL via RESPIRATORY_TRACT
  Filled 2024-08-18 (×4): qty 3

## 2024-08-18 NOTE — Progress Notes (Signed)
 Gabrielle Wood [address] [facility] Liaison Note:  Notified by [facility] manager of patient/family request for [facility] Palliative services at home after discharge.   Please call with any hospice or outpatient palliative care related questions.  Thank you for the opportunity to participate in this patient's care.   Greig Basket, BSN, RN [facility] Liaison [phone]

## 2024-08-18 NOTE — Progress Notes (Signed)
 Mobility Specialist Progress Note:    08/18/24 1205  Mobility  Activity Pivoted/transferred from bed to chair  Level of Assistance Contact guard assist, steadying assist  Assistive Device Front wheel walker  Distance Ambulated (ft) 4 ft  Range of Motion/Exercises Active;All extremities  Activity Response Tolerated well  Mobility Referral Yes  Mobility visit 1 Mobility  Mobility Specialist Start Time (ACUTE ONLY) 1205  Mobility Specialist Stop Time (ACUTE ONLY) 1220  Mobility Specialist Time Calculation (min) (ACUTE ONLY) 15 min   Pt received in bed, agreeable to mobility. Required CGA to stand and ambulate with RW. Tolerated well,asx throughout. Alarm on, NT in room. All needs met.  Rylie Knierim Mobility Specialist Please contact via Special educational needs teacher or  Rehab office at [phone]

## 2024-08-18 NOTE — Progress Notes (Signed)
 PROGRESS NOTE    Gabrielle Wood  FMW:[id number] DOB: 07/04/1946 DOA: 08/14/2024 PCP: Trudy Vaughn FALCON, MD   Brief Narrative: Gabrielle Wood is a 78 y.o. female with a history of COPD, chronic respiratory failure, diabetes mellitus type 2, hypertension, hyperlipidemia, hypothyroidism, GERD.  Patient presented secondary to weakness and failure to thrive and found to have concern for sepsis with evidence of pneumonia. Empiric antibiotics started.   Assessment/Plan:  Sepsis Present on admission. Likely secondary to pneumonia; urinalysis does not suggest infection. Lactic acidosis with lactic acid of 2.7 down to 1.9. IV fluids started, blood cultures obtained and empiric Vancomycin , Cefepime  and Flagyl  started. Flagyl  discontinued. Negative blood cultures to date.  Community acquired pneumonia CT imaging consistent with bilateral posterior pleural thickening with mild subpleural consolidation/atelectasis. Associated trace pleural effusion noted. With presenting clinical picture, likely infectious etiology, consistent with pneumonia. Procalcitonin elevated at 0.98 with peak of 1.42, now down to 0.95. -Continue Vancomycin  and Cefepime ; transition to oral on discharge. Treat for a total of 5 days.  AKI Baseline creatinine of about 0.6 to 0.8. Creatinine of 1.17 on admission. IV fluids started. CT imaging identifies right-sided hydronephrosis. Renal function improved to baseline.  Failure to thrive Noted. PT/OT consulted with recommendation for SNF. Insurance declined.  Left hip pain No precipitating injury. Hip x-ray obtained and is significant for mild hip osteoarthritis. -Analgesics as needed -Outpatient orthopedic surgery follow-up as needed  Acute metabolic encephalopathy Likely related to underlying infection. Resolved mostly. Some confusion in the morning that improves. Probably related to Xanax  being given late at night. -Change to Xanax  at bedtime to be given at 9:30  PM  COPD No evidence of exacerbation.  Constipation -MiraLAX   Chronic respiratory failure with hypoxia Patient is on 2 L/min baseline oxygen  use. Stable.  CAD No chest pain. History of stent placement and currently on dual antiplatelet therapy with aspirin  and Brilinta . Patient is also on Lipitor. -Continue aspirin , Brilinta , Lipitor  Diabetes mellitus type 2 Uncontrolled with hyperglycemia based on last hemoglobin A1C of 10.4%. Patient is on Tresiba  66 units at bedtime and SSI. Patient started on Insulin  glargine 26 units this admission, in addition to Novolog  meal coverage and sliding scale. -Increased to insulin  glargine 40 units -Increased to Novolog  11 units TID with meals -Continue SSI  Primary hypertension -Continue metoprolol  succinate  Anemia Unclear if this is chronic or acute. Last hemoglobin of 13.5 from one year prior. Hemoglobin of 11.3 on admission, but this is possibly related to some hemoconcentration. Hemoglobin down to 8.5 today. No evidence of bleeding. Normal bilirubin. No associated leukopenia or thrombocytopenia. Anemia panel significant for evidence of iron deficiency; unclear cause. -Iron supplementation -Outpatient GI follow-up  Hyperlipidemia -Continue Lipitor  Hypothyroidism -Continue Synthroid  135 mcg (discontinue 175 mcg dose on discharge)  GERD -Continue Protonix   Anxiety -Continue Xanax  as needed  Chronic pain Neuropathy -Continue gabapentin    DVT prophylaxis: SCDs Code Status:   Code Status: Limited: Do not attempt resuscitation (DNR) -DNR-LIMITED -Do Not Intubate/DNI  Family Communication: Son and friend at bedside. Disposition Plan: Discharge home with home health service and medical bed. Anticipate discharge tomorrow, pending delivery of equipment.   Consultants:  Palliative care medicine  Procedures:  None  Antimicrobials: Vancomycin  Cefepime  Flagyl     Subjective: Family states patient was a bit confused earlier  this morning, but this has improved. Overall patient appears to be better than on admission.  Objective: BP (!) 142/74 (BP Location: Left Arm)   Pulse 91   Temp  98.8 F (37.1 C) (Oral)   Resp 20   Ht 5' 6 (1.676 m)   Wt 72.2 kg   SpO2 95%   BMI 25.69 kg/m   Examination:  General exam: Appears calm and comfortable Respiratory system: Clear to auscultation. Respiratory effort normal. Cardiovascular system: S1 & S2 heard, RRR.  Gastrointestinal system: Abdomen is nondistended, soft and nontender.  Normal bowel sounds heard. Central nervous system: Alert and oriented. Musculoskeletal: No edema. No calf tenderness   Data Reviewed: I have personally reviewed following labs and imaging studies   Last CBC Lab Results  Component Value Date   WBC 7.2 08/17/2024   HGB 8.9 (L) 08/17/2024   HCT 27.6 (L) 08/17/2024   MCV 89.9 08/17/2024   MCH 29.0 08/17/2024   RDW 15.9 (H) 08/17/2024   PLT 221 08/17/2024     Last metabolic panel Lab Results  Component Value Date   GLUCOSE 314 (H) 08/17/2024   NA 136 08/17/2024   K 4.3 08/17/2024   CL 104 08/17/2024   CO2 23 08/17/2024   BUN 14 08/17/2024   CREATININE 0.73 08/17/2024   GFRNONAA >60 08/17/2024   CALCIUM  9.1 08/17/2024   PHOS 1.2 (L) 10/15/2021   PROT 7.4 08/14/2024   ALBUMIN 3.3 (L) 08/14/2024   LABGLOB 2.3 07/17/2017   AGRATIO 1.9 07/17/2017   BILITOT 0.8 08/14/2024   ALKPHOS 53 08/14/2024   AST 18 08/14/2024   ALT 11 08/14/2024   ANIONGAP 9 08/17/2024     Creatinine Clearance: Estimated Creatinine Clearance: 58.1 mL/min (by C-G formula based on SCr of 0.73 mg/dL).  Recent Results (from the past 240 hours)  Culture, blood (Routine x 2)     Status: None (Preliminary result)   Collection Time: 08/14/24 11:10 PM   Specimen: BLOOD LEFT ARM  Result Value Ref Range Status   Specimen Description BLOOD LEFT ARM  Final   Special Requests   Final    BOTTLES DRAWN AEROBIC AND ANAEROBIC Blood Culture adequate volume    Culture   Final    NO GROWTH 3 DAYS Performed at [facility], [address] [place], [place] [address]    Report Status PENDING  Incomplete  Culture, blood (Routine x 2)     Status: None (Preliminary result)   Collection Time: 08/14/24 11:30 PM   Specimen: BLOOD RIGHT HAND  Result Value Ref Range Status   Specimen Description BLOOD RIGHT HAND  Final   Special Requests   Final    BOTTLES DRAWN AEROBIC AND ANAEROBIC Blood Culture adequate volume   Culture   Final    NO GROWTH 3 DAYS Performed at [facility], [address] [place], [place] [address]    Report Status PENDING  Incomplete  Resp panel by RT-PCR (RSV, Flu A&B, Covid) Anterior Nasal Swab     Status: None   Collection Time: 08/14/24 11:43 PM   Specimen: Anterior Nasal Swab  Result Value Ref Range Status   SARS Coronavirus 2 by RT PCR NEGATIVE NEGATIVE Final    Comment: (NOTE) SARS-CoV-2 target nucleic acids are NOT DETECTED.  The SARS-CoV-2 RNA is generally detectable in upper respiratory specimens during the acute phase of infection. The lowest concentration of SARS-CoV-2 viral copies this assay can detect is 138 copies/mL. A negative result does not preclude SARS-Cov-2 infection and should not be used as the sole basis for treatment or other patient management decisions. A negative result may occur with  improper specimen collection/handling, submission of specimen other than nasopharyngeal swab,  presence of viral mutation(s) within the areas targeted by this assay, and inadequate number of viral copies(<138 copies/mL). A negative result must be combined with clinical observations, patient history, and epidemiological information. The expected result is Negative.  Fact Sheet for Patients:  BloggerCourse.com  Fact Sheet for Healthcare Providers:  SeriousBroker.it  This test is no t yet approved or cleared by the [place]  FDA and  has been authorized for  detection and/or diagnosis of SARS-CoV-2 by FDA under an Emergency Use Authorization (EUA). This EUA will remain  in effect (meaning this test can be used) for the duration of the COVID-19 declaration under Section 564(b)(1) of the Act, 21 U.S.C.section 360bbb-3(b)(1), unless the authorization is terminated  or revoked sooner.       Influenza A by PCR NEGATIVE NEGATIVE Final   Influenza B by PCR NEGATIVE NEGATIVE Final    Comment: (NOTE) The Xpert Xpress SARS-CoV-2/FLU/RSV plus assay is intended as an aid in the diagnosis of influenza from Nasopharyngeal swab specimens and should not be used as a sole basis for treatment. Nasal washings and aspirates are unacceptable for Xpert Xpress SARS-CoV-2/FLU/RSV testing.  Fact Sheet for Patients: BloggerCourse.com  Fact Sheet for Healthcare Providers: SeriousBroker.it  This test is not yet approved or cleared by the [place]  FDA and has been authorized for detection and/or diagnosis of SARS-CoV-2 by FDA under an Emergency Use Authorization (EUA). This EUA will remain in effect (meaning this test can be used) for the duration of the COVID-19 declaration under Section 564(b)(1) of the Act, 21 U.S.C. section 360bbb-3(b)(1), unless the authorization is terminated or revoked.     Resp Syncytial Virus by PCR NEGATIVE NEGATIVE Final    Comment: (NOTE) Fact Sheet for Patients: BloggerCourse.com  Fact Sheet for Healthcare Providers: SeriousBroker.it  This test is not yet approved or cleared by the [place]  FDA and has been authorized for detection and/or diagnosis of SARS-CoV-2 by FDA under an Emergency Use Authorization (EUA). This EUA will remain in effect (meaning this test can be used) for the duration of the COVID-19 declaration under Section 564(b)(1) of the Act, 21 U.S.C. section 360bbb-3(b)(1), unless the authorization is  terminated or revoked.  Performed at [facility], [address], [place], [place] [address]   Respiratory (~20 pathogens) panel by PCR     Status: None   Collection Time: 08/15/24  1:30 PM   Specimen: Nasopharyngeal Swab; Respiratory  Result Value Ref Range Status   Adenovirus NOT DETECTED NOT DETECTED Final   Coronavirus 229E NOT DETECTED NOT DETECTED Final    Comment: (NOTE) The Coronavirus on the Respiratory Panel, DOES NOT test for the novel  Coronavirus (2019 nCoV)    Coronavirus HKU1 NOT DETECTED NOT DETECTED Final   Coronavirus NL63 NOT DETECTED NOT DETECTED Final   Coronavirus OC43 NOT DETECTED NOT DETECTED Final   Metapneumovirus NOT DETECTED NOT DETECTED Final   Rhinovirus / Enterovirus NOT DETECTED NOT DETECTED Final   Influenza A NOT DETECTED NOT DETECTED Final   Influenza B NOT DETECTED NOT DETECTED Final   Parainfluenza Virus 1 NOT DETECTED NOT DETECTED Final   Parainfluenza Virus 2 NOT DETECTED NOT DETECTED Final   Parainfluenza Virus 3 NOT DETECTED NOT DETECTED Final   Parainfluenza Virus 4 NOT DETECTED NOT DETECTED Final   Respiratory Syncytial Virus NOT DETECTED NOT DETECTED Final   Bordetella pertussis NOT DETECTED NOT DETECTED Final   Bordetella Parapertussis NOT DETECTED NOT DETECTED Final   Chlamydophila pneumoniae NOT DETECTED NOT DETECTED Final  Mycoplasma pneumoniae NOT DETECTED NOT DETECTED Final    Comment: Performed at [facility], [address], [place], [place] [address]      Radiology Studies: No results found.     LOS: 3 days    Elgin Lam, MD [facility] 08/18/2024, 11:49 AM   If 7PM-7AM, please contact night-coverage [email or web address]

## 2024-08-18 NOTE — Consult Note (Signed)
 Consultation Note Date: 08/18/2024   Patient Name: Gabrielle Wood  DOB: 1946-10-24  MRN: [id number]  Age / Sex: 78 y.o., female  PCP: Trudy Vaughn FALCON, MD Referring Physician: Briana Elgin LABOR, MD  Reason for Consultation: {Reason for Consult:23484}  HPI/Patient Profile: 78 y.o. female  with past medical history of *** admitted on 08/14/2024 with ***.    PMT has been consulted to assist with goals of care conversation.  Clinical Assessment and Goals of Care:  I have reviewed medical records including EPIC notes, labs and imaging (independently reviewed), ***, assessed the patient and then *** with *** to discuss diagnosis prognosis, GOC, EOL wishes, disposition and options. Collaborated directly with ***.   I introduced Palliative Medicine as specialized medical care for people living with serious illness. It focuses on providing relief from the symptoms and stress of a serious illness. The goal is to improve quality of life for both the patient and the family.  We discussed a brief life review of the patient and then focused on their current illness. ***  I attempted to elicit values and goals of care important to the patient.    Medical History Review and Family/Patient Understanding:   ***  Social History:  ***  Functional and Nutritional State:  ***  Palliative Symptoms:  ***  Advance Directives/Goals of Care/Anticipatory care planning Discussion:  A detailed discussion regarding GOC, advanced directives, and anticipatory care planning was had. ***    The difference between aggressive medical intervention and comfort care was considered in light of the patient's goals of care. Hospice and Palliative Care services outpatient were explained and offered.   Discussed the importance of continued conversation with family and the medical providers regarding overall plan of care and treatment options, ensuring decisions are within the context  of the patient's values and GOCs.   Questions and concerns were addressed.  Hard Choices booklet left for review. The family was encouraged to call with questions or concerns.  PMT will continue to support holistically.  Primary Decision maker and health care surrogate:  {Primary Decision Fjxzm:78612}  Code Status:  ***    SUMMARY OF RECOMMENDATIONS   *** Code Status/Advance Care Planning: {Palliative Code status:23503}   Symptom Management:  ***  Palliative Prophylaxis:  {Palliative Prophylaxis:21015}  Additional Recommendations (Limitations, Scope, Preferences): {Recommended Scope and Preferences:21019}  Psycho-social/Spiritual:  Desire for further Chaplaincy support:{YES NO:22349} Additional Recommendations: {PAL SOCIAL:21064}  Prognosis:  {Palliative Care Prognosis:23504}  Discharge Planning: {Palliative dispostion:23505}      Primary Diagnoses: Present on Admission:  Sepsis (HCC)    Physical Exam  Vital Signs: BP (!) 107/58 (BP Location: Right Arm)   Pulse 98   Temp 99.1 F (37.3 C) (Oral)   Resp 18   Ht 5' 6 (1.676 m)   Wt 72.2 kg   SpO2 94%   BMI 25.69 kg/m  Pain Scale: 0-10   Pain Score: 0-No pain   SpO2: SpO2: 94 % O2 Device:SpO2: 94 % O2 Flow Rate: .O2 Flow Rate (L/min): 1 L/min   Palliative Assessment/Data:    Total time: I spent *** minutes in the care of the patient today in the above activities and documenting the encounter.  Billing based on MDM: ***  {Problems Addressed:304933}  {Amount and/or Complexity of Ijuj:695065}  {Risks:304936}   Laymon CHRISTELLA Pinal, NP  Palliative Medicine Team Team phone # [phone]  Thank you for allowing the Palliative Medicine Team to assist in the care of this patient. Please utilize secure chat with  additional questions, if there is no response within 30 minutes please call the above phone number.  Palliative Medicine Team providers are available by phone from 7am to 7pm daily  and can be reached through the team cell phone.  Should this patient require assistance outside of these hours, please call the patient's attending physician.

## 2024-08-18 NOTE — Plan of Care (Signed)
   Problem: Coping: Goal: Level of anxiety will decrease Outcome: Progressing   Problem: Coping: Goal: Level of anxiety will decrease Outcome: Progressing

## 2024-08-18 NOTE — Plan of Care (Signed)
  Problem: Nutritional: Goal: Maintenance of adequate nutrition will improve Outcome: Progressing   Problem: Skin Integrity: Goal: Risk for impaired skin integrity will decrease Outcome: Progressing   Problem: Activity: Goal: Risk for activity intolerance will decrease Outcome: Progressing

## 2024-08-18 NOTE — Progress Notes (Signed)
 Patient requires frequent re-positioning of the body in ways that cannot be achieved with  an ordinary bed or wedge pillow, to eliminate pain, reduce pressure, and the head of the  bed to be elevated more than 30 degrees most of the time due to Rheumatoid arthritis and pressure injury to skin.

## 2024-08-18 NOTE — TOC Progression Note (Addendum)
 Transition of Care [facility]) - Progression Note    Patient Details  Name: Gabrielle Wood MRN: [id number] Date of Birth: 1946-10-26  Transition of [facility]) CM/SW Contact  [name], LCSW Phone Number: 08/18/2024, 12:34 PM  Clinical Narrative:    Pt has 2 more rehab days under insurance before hitting co-pay days. Per [facility] co-pay amount is $203 per day.  Spoke with pt's son to discuss SNF placement and cost. Pt's son reports they are unable to afford the daily co-pay amount for rehab. CSW discussed alternate plan for pt to return home with home health. Son shares pt is active with [facility] for [facility] services and states that pt will need to have a hospital bed delivered to the home before she can return. MD notified.  Hospital bed has been ordered through [employer]. Confirmed HH PT/OT w Dorothe at [place].   ICM will follow for bed delivery.     ADDENDUM: Per son hospital bed to be delivered to home after 4pm today.  Pt also recommended for outpatient palliative care follow up. Son agreeable to referral to [facility]. Referral made to Eleanor Nail with [facility] for outpatient palliative care follow up.    Expected Discharge Plan: Skilled Nursing Facility Barriers to Discharge: Continued Medical Work up, SNF Pending bed offer               Expected Discharge Plan and Services     Post Acute Care Choice: Durable Medical Equipment Living arrangements for the past 2 months: Single Family Home                                       Social Drivers of Health (SDOH) Interventions SDOH Screenings   Food Insecurity: No Food Insecurity (08/15/2024)  Housing: Low Risk  (08/15/2024)  Transportation Needs: No Transportation Needs (08/15/2024)  Utilities: Not At Risk (08/15/2024)  Depression (PHQ2-9): High Risk (11/01/2021)  Financial Resource Strain: Low Risk  (08/11/2024)   [facility] from [facility]  Physical Activity: Inactive (08/11/2024)   Received from [facility]   Social Connections: Socially Isolated (08/15/2024)  Stress: No Stress Concern Present (08/11/2024)   [facility] from [facility]  Tobacco Use: Medium Risk (08/14/2024)  Health Literacy: Medium Risk (08/11/2024)   Received from [facility]    Readmission Risk Interventions    08/17/2024    2:30 PM  Readmission Risk Prevention Plan  Medication Screening Complete  Transportation Screening Complete

## 2024-08-19 ENCOUNTER — Encounter (HOSPITAL_COMMUNITY): Payer: Self-pay | Admitting: Internal Medicine

## 2024-08-19 LAB — CREATININE, SERUM
Creatinine, Ser: 0.87 mg/dL (ref 0.44–1.00)
GFR, Estimated: >60 mL/min (ref >60)

## 2024-08-19 LAB — CULTURE, BLOOD (ROUTINE X 2)
Culture: NO GROWTH
Culture: NO GROWTH
Special Requests: ADEQUATE
Special Requests: ADEQUATE

## 2024-08-19 LAB — GLUCOSE, CAPILLARY
Glucose-Capillary: 299 mg/dL — ABNORMAL HIGH (ref 70–99)
Glucose-Capillary: 283 mg/dL — ABNORMAL HIGH (ref 70–99)
Glucose-Capillary: 104 mg/dL — ABNORMAL HIGH (ref 70–99)

## 2024-08-19 MED ORDER — ALPRAZOLAM 1 MG PO TABS: 0.5000 mg | ORAL_TABLET | Freq: Every day | ORAL | Status: AC

## 2024-08-19 MED ORDER — GABAPENTIN 100 MG PO CAPS: 100.0000 mg | ORAL_CAPSULE | Freq: Three times a day (TID) | ORAL | 0 refills | Status: AC

## 2024-08-19 MED ORDER — IPRATROPIUM-ALBUTEROL 0.5-2.5 (3) MG/3ML IN SOLN: 3.0000 mL | Freq: Two times a day (BID) | RESPIRATORY_TRACT | Status: DC

## 2024-08-19 MED ORDER — TRESIBA FLEXTOUCH 100 UNIT/ML ~~LOC~~ SOPN: 66.0000 [IU] | PEN_INJECTOR | Freq: Every day | SUBCUTANEOUS | Status: DC

## 2024-08-19 NOTE — Discharge Summary (Signed)
 Physician Discharge Summary  Gabrielle Wood FMW:[id number] DOB: [birth year] DOA: 08/14/2024  PCP: Trudy Vaughn FALCON, MD  Admit date: 08/14/2024 Discharge date: 08/19/2024  Admitted From: Home Disposition: Home  Recommendations for Outpatient Follow-up:  Follow up with PCP in 1 week with repeat CBC/BMP Outpatient follow-up with palliative care Follow up in ED if symptoms worsen or new appear   Home Health: D/OT Equipment/Devices: None  Discharge Condition: Stable CODE STATUS: DNR Diet recommendation: Heart healthy/carb modified  Brief/Interim Summary: 78 y.o. female with a history of COPD, chronic respiratory failure, diabetes mellitus type 2, hypertension, hyperlipidemia, hypothyroidism, GERD presented with weakness and failure to thrive and was found to have sepsis possibly from [facility] pneumonia.  She was started on IV antibiotics.  During the hospitalization, her condition has improved.  PT/OT recommended SNF but insurance denied.  She is currently medically stable for discharge.  She will be discharged home with home health PT/OT today.  Discharge Diagnoses:   Severe sepsis: Present on admission Possible community-acquired pneumonia: Present on admission Lactic acidosis: Resolved - Treated with broad-spectrum antibiotics: Today is day #5.  Sepsis has resolved.  Currently hemodynamically stable.  Cultures negative so far. -Discharge patient home today.  No need for any more antibiotics on discharge  AKI - Resolved.  Treated with IV fluids.  Outpatient follow-up  Failure to thrive - PT/OT recommended SNF but insurance denied.  She is currently medically stable for discharge.  She will be discharged home with home health PT/OT today.  Left hip pain - X-ray showed mild hip osteoarthritis.  Outpatient follow-up with orthopedic surgery as needed.  Continue analgesic as needed  Acute metabolic encephalopathy Anxiety - Possibly from infection.  Mostly resolved.  Xanax  dose  has been readjusted to nightly dose.  COPD -Stable.  No signs of exacerbation  Constipation--continue bowel regimen  Diabetes mellitus type 2 with hyperglycemia - Carb modified diet.  Continue long-acting insulin  along with short acting insulin  with meals.  Outpatient follow-up with PCP  Essential hypertension - Continue metoprolol .  Resume Diovan  CAD Hyperlipidemia - Stable.  Continue aspirin  and Brilinta  along with Lipitor  Anemia of chronic disease - From chronic illnesses.  Hemoglobin currently stable.  Monitor intermittently as an outpatient.    Leukocytosis -Resolved  Hypothyroidism -Continue Synthroid   GERD -Continue Protonix   Chronic pain Neuropathy - Continue reduced dose of Neurontin   Discharge Instructions  Discharge Instructions     Diet - low sodium heart healthy   Complete by: As directed    Diet Carb Modified   Complete by: As directed    Increase activity slowly   Complete by: As directed    Pulmonary Visit   Complete by: As directed    Chronic hypoxia, COPD, recurrent pneumonia   Reason for referral: Other Pulmonary      Allergies as of 08/19/2024       Reactions   Amlodipine     Ciprocin-fluocin-procin [fluocinolone]    Nightmares   Lipitor [atorvastatin ] Other (See Comments)   Muscle aches, pain   Nebivolol    Nitrofuran Derivatives    Lisinopril Cough   Red Yeast Rice [cholestin] Itching, Rash        Medication List     STOP taking these medications    levofloxacin  750 MG tablet Commonly known as: LEVAQUIN        TAKE these medications    albuterol  108 (90 Base) MCG/ACT inhaler Commonly known as: VENTOLIN  HFA Inhale 1-2 puffs into the lungs every 4 (four) hours as  needed for wheezing or shortness of breath.   albuterol  (2.5 MG/3ML) 0.083% nebulizer solution Commonly known as: PROVENTIL  Take 3 mLs (2.5 mg total) by nebulization every 6 (six) hours as needed for wheezing or shortness of breath.   ALPRAZolam  1 MG  tablet Commonly known as: XANAX  Take 0.5 tablets (0.5 mg total) by mouth at bedtime. 0.5 tablet in am and 0.5 tab at night What changed:  how much to take when to take this   aspirin  EC 81 MG tablet Take 81 mg by mouth daily. Swallow whole.   atorvastatin  80 MG tablet Commonly known as: LIPITOR Take 80 mg by mouth every evening.   gabapentin  100 MG capsule Commonly known as: NEURONTIN  Take 1 capsule (100 mg total) by mouth 3 (three) times daily. What changed:  medication strength how much to take   guaiFENesin 600 MG 12 hr tablet Commonly known as: MUCINEX Take 600 mg by mouth 2 (two) times daily.   insulin  lispro 100 UNIT/ML KwikPen Commonly known as: HumaLOG  KwikPen Inject 8-14 Units into the skin 3 (three) times daily. What changed: how much to take   levothyroxine  175 MCG tablet Commonly known as: SYNTHROID  Take 175 mcg by mouth daily before breakfast.   magnesium  oxide 400 (240 Mg) MG tablet Commonly known as: MAG-OX Take 400 mg by mouth daily.   metoprolol  succinate 25 MG 24 hr tablet Commonly known as: TOPROL -XL Take 25 mg by mouth daily.   Myrbetriq 25 MG Tb24 tablet Generic drug: mirabegron ER Take 25 mg by mouth daily.   nitrofurantoin 50 MG capsule Commonly known as: MACRODANTIN Take 50 mg by mouth at bedtime.   nitroGLYCERIN 0.4 MG SL tablet Commonly known as: NITROSTAT Place 0.4 mg under the tongue every 5 (five) minutes as needed for chest pain.   pantoprazole  40 MG tablet Commonly known as: PROTONIX  Take 40 mg by mouth daily.   polyethylene glycol powder 17 GM/SCOOP powder Commonly known as: GLYCOLAX /MIRALAX  Take 17 g by mouth daily as needed for mild constipation or moderate constipation. Dissolve 1 capful (17g) in 4-8 ounces of liquid and take by mouth daily.   ticagrelor  90 MG Tabs tablet Commonly known as: BRILINTA  Take 90 mg by mouth 2 (two) times daily.   Trelegy Ellipta 100-62.5-25 MCG/ACT Aepb Generic drug:  Fluticasone-Umeclidin-Vilant Inhale 1 puff into the lungs daily.   Tresiba  FlexTouch 100 UNIT/ML FlexTouch Pen Generic drug: insulin  degludec Inject 66 Units into the skin at bedtime.   valsartan 80 MG tablet Commonly known as: DIOVAN Take 40 mg by mouth daily.               Durable Medical Equipment  (From admission, onward)           Start     Ordered   08/18/24 1118  For home use only DME Hospital bed  Once       Question Answer Comment  Length of Need Lifetime   Bed type Semi-electric      08/18/24 1135            Follow-up Information     Trudy Vaughn FALCON, MD. Schedule an appointment as soon as possible for a visit in 1 week(s).   Specialty: Internal Medicine Contact information: [address] [place] [address] [phone]                Allergies  Allergen Reactions   Amlodipine     Ciprocin-Fluocin-Procin [Fluocinolone]     Nightmares   Lipitor [Atorvastatin ]  Other (See Comments)    Muscle aches, pain   Nebivolol    Nitrofuran Derivatives    Lisinopril Cough   Red Yeast Rice [Cholestin] Itching and Rash    Consultations: Palliative care consultation pending.  This can happen as an outpatient   Procedures/Studies: DG HIP UNILAT WITH PELVIS 2-3 VIEWS LEFT Result Date: 08/15/2024 EXAM: 2 or more VIEW(S) XRAY OF THE LEFT HIP 08/15/2024 07:28:00 AM COMPARISON: None available. CT chest, abdomen and pelvis from earlier today. CLINICAL HISTORY: Pt with left hip pain; pt able to move leg. FINDINGS: BONES AND JOINTS: No acute fracture or focal osseous lesion. The hip joint is maintained. Mild bilateral hip osteoarthritis. Osteopenia. SOFT TISSUES: The soft tissues are unremarkable. IMPRESSION: 1. No acute osseous findings. 2. Mild bilateral hip osteoarthritis. Electronically signed by: Waddell Calk MD 08/15/2024 07:44 AM EDT RP Workstation: [id number]   CT CHEST ABDOMEN PELVIS W CONTRAST Result Date: 08/15/2024 EXAM: CT CHEST, ABDOMEN  AND PELVIS WITH CONTRAST 08/15/2024 06:31:49 AM TECHNIQUE: CT of the chest, abdomen and pelvis was performed with the administration of intravenous contrast. Multiplanar reformatted images are provided for review. Automated exposure control, iterative reconstruction, and/or weight based adjustment of the mA/kV was utilized to reduce the radiation dose to as low as reasonably achievable. COMPARISON: CT abdomen and pelvis 07/05/2022 and CT angio chest 10/14/2021. CLINICAL HISTORY: Sepsis, unclear source. ? L basilar infiltrate. recent imaging with question of R pyelo, pelviectasis, ? of prox ureteral stricture, endometrial thickening. increased weakness after being discharged yesterday from [facility] with diagnosis of Pneumonia.Pt wears O2 @ 2L [place] at all times. Family reports CBG of 236 at home. Family states pt like a dishrag and needed staff to help family get pt out of car. FINDINGS: CHEST: MEDIASTINUM AND LYMPH NODES: Heart size is upper limits of normal. No pericardial effusion. No mediastinal or hilar adenopathy. Prominent nonpathologically enlarged lymph nodes within the chest are likely reactive in etiology and appear similar to previous exam. Central airways are patent. LUNGS AND PLEURA: Bilateral posterior pleural thickening with subpleural consolidation or atelectasis noted within the lung bases, left greater than right. Mild diffuse interlobular septal thickening is noted. Central airway thickening with mild prominence of the peribronchovascular interstitium. No pneumothorax identified. Subpleural scarring versus atelectasis within the posterior medial left upper lobe. No suspicious lung nodule or mass. ABDOMEN AND PELVIS: LIVER: The liver is unremarkable. GALLBLADDER AND BILE DUCTS: Gallbladder is unremarkable. No biliary ductal dilatation. SPLEEN: No acute abnormality. PANCREAS: No acute abnormality. ADRENAL GLANDS: No acute abnormality. KIDNEYS, URETERS AND BLADDER: Bilateral simple kidney cysts are  identified. The largest cyst arises off the lateral right kidney measuring 4.4 cm, image 76/2. No follow up imaging recommended. There is moderate right-sided hydronephrosis, new from the previous exam. Dilatation of the right renal pelvis is identified up to the UPJ. The ureter appears normal in caliber without signs of ureteral calculi. Moderate bladder distention without focal bladder abnormality. GI AND BOWEL: Small hiatal hernia. The appendix is visualized and appears normal. Moderate stool burden identified within the cecum and ascending colon. Mild stool burden noted elsewhere in the colon. No pathologic dilatation of the large or small bowel loops. No signs of bowel inflammation. REPRODUCTIVE ORGANS: Calcified uterine fibroid noted. No adnexal mass. PERITONEUM AND RETROPERITONEUM: No free fluid or fluid collections. No signs of free air. VASCULATURE: Aortic atherosclerotic calcification. ABDOMINAL AND PELVIS LYMPH NODES: No signs of abdominal or pelvic adenopathy. BONES AND SOFT TISSUES: Lumbar spondylosis. No acute osseous abnormality. No focal soft  tissue abnormality. IMPRESSION: 1. Trace right pleural effusion with bilateral posterior pleural thickening with mild subpleural consolidation/atelectasis, left greater than right. Findings are favored to represent sequelae of inflammation/infection. 2. Thickening of the peribronchovascular interstitium with mild interstitial thickening is nonspecific but may reflect mild interstitial edema. 3. Right-sided hydronephrosis new from the previous exam, with dilatation of the right renal pelvis up to the UPJ. The ureter appears normal in caliber without signs of ureteral calculi. Differential considerations include sequelae of recently passed Kidney Stone reflux, or ascending urinary tract infection. Underlying urothelial lesion is less favored Recommend follow-up imaging to ensure resolution and to exclude underlying obstructing lesion. 4. Moderate stool burden.   Correlate for signs and symptoms of constipation. Electronically signed by: Waddell Calk MD 08/15/2024 06:54 AM EDT RP Workstation: [id number]   DG Chest Port 1 View if patient is in a treatment room. Result Date: 08/14/2024 EXAM: 1 VIEW XRAY OF THE CHEST 08/14/2024 11:21:20 PM COMPARISON: None available. CLINICAL HISTORY: Suspected Sepsis. Pt brought in by family with increased weakness after being discharged yesterday from [facility] with diagnosis of Pneumonia.Pt wears O2 @ 2L [place] at all times. FINDINGS: LUNGS AND PLEURA: Interval development of a small left pleural effusion with associated left basilar atelectasis. Lungs are otherwise clear. No pneumothorax. No pleural effusion on the right. HEART AND MEDIASTINUM: Cardiac size within normal limits. Pulmonary vascularity is normal. BONES AND SOFT TISSUES: No acute osseous abnormality. IMPRESSION: 1. Interval development of a small left pleural effusion with associated left basilar atelectasis. Electronically signed by: Dorethia Molt MD 08/14/2024 11:28 PM EDT RP Workstation: [id number]      Subjective: Patient seen and examined at bedside.  Feels okay to go home today.  No fever, vomiting, abdominal pain reported.  Discharge Exam: Vitals:   08/19/24 0729 08/19/24 0900  BP:  129/74  Pulse:  94  Resp:  17  Temp:  98.5 F (36.9 C)  SpO2: 92% 92%    General: Pt is alert, awake, not in acute distress.  Elderly female lying in bed.  Looks chronically ill and deconditioned. Cardiovascular: rate controlled, S1/S2 + Respiratory: bilateral decreased breath sounds at bases with scattered crackles. Abdominal: Soft, NT, ND, bowel sounds + Extremities: Left lower extremity edema; no cyanosis    The results of significant diagnostics from this hospitalization (including imaging, microbiology, ancillary and laboratory) are listed below for reference.     Microbiology: Recent Results (from the past 240 hours)  Culture, blood (Routine x 2)     Status:  None (Preliminary result)   Collection Time: 08/14/24 11:10 PM   Specimen: BLOOD LEFT ARM  Result Value Ref Range Status   Specimen Description   Final    BLOOD LEFT ARM Performed at [facility], [address], [place], [place] [address]    Special Requests   Final    BOTTLES DRAWN AEROBIC AND ANAEROBIC Blood Culture adequate volume Performed at [facility], [address], [place], [place] [address]    Culture   Final    NO GROWTH 4 DAYS Performed at [facility], [address], [place], [place] [address]    Report Status PENDING  Incomplete  Culture, blood (Routine x 2)     Status: None (Preliminary result)   Collection Time: 08/14/24 11:30 PM   Specimen: BLOOD RIGHT HAND  Result Value Ref Range Status   Specimen Description   Final    BLOOD RIGHT HAND Performed at [facility], [address] [place], [place] [address]  Special Requests   Final    BOTTLES DRAWN AEROBIC AND ANAEROBIC Blood Culture adequate volume Performed at [facility], [address] [place], [place] [address]    Culture   Final    NO GROWTH 4 DAYS Performed at [facility], [address], [place], [place] [address]    Report Status PENDING  Incomplete  Resp panel by RT-PCR (RSV, Flu A&B, Covid) Anterior Nasal Swab     Status: None   Collection Time: 08/14/24 11:43 PM   Specimen: Anterior Nasal Swab  Result Value Ref Range Status   SARS Coronavirus 2 by RT PCR NEGATIVE NEGATIVE Final    Comment: (NOTE) SARS-CoV-2 target nucleic acids are NOT DETECTED.  The SARS-CoV-2 RNA is generally detectable in upper respiratory specimens during the acute phase of infection. The lowest concentration of SARS-CoV-2 viral copies this assay can detect is 138 copies/mL. A negative result does not preclude SARS-Cov-2 infection and should not be used as the sole basis for treatment or other patient management decisions. A negative result may occur with  improper specimen collection/handling,  submission of specimen other than nasopharyngeal swab, presence of viral mutation(s) within the areas targeted by this assay, and inadequate number of viral copies(<138 copies/mL). A negative result must be combined with clinical observations, patient history, and epidemiological information. The expected result is Negative.  Fact Sheet for Patients:  BloggerCourse.com  Fact Sheet for Healthcare Providers:  SeriousBroker.it  This test is no t yet approved or cleared by the [place]  FDA and  has been authorized for detection and/or diagnosis of SARS-CoV-2 by FDA under an Emergency Use Authorization (EUA). This EUA will remain  in effect (meaning this test can be used) for the duration of the COVID-19 declaration under Section 564(b)(1) of the Act, 21 U.S.C.section 360bbb-3(b)(1), unless the authorization is terminated  or revoked sooner.       Influenza A by PCR NEGATIVE NEGATIVE Final   Influenza B by PCR NEGATIVE NEGATIVE Final    Comment: (NOTE) The Xpert Xpress SARS-CoV-2/FLU/RSV plus assay is intended as an aid in the diagnosis of influenza from Nasopharyngeal swab specimens and should not be used as a sole basis for treatment. Nasal washings and aspirates are unacceptable for Xpert Xpress SARS-CoV-2/FLU/RSV testing.  Fact Sheet for Patients: BloggerCourse.com  Fact Sheet for Healthcare Providers: SeriousBroker.it  This test is not yet approved or cleared by the [place]  FDA and has been authorized for detection and/or diagnosis of SARS-CoV-2 by FDA under an Emergency Use Authorization (EUA). This EUA will remain in effect (meaning this test can be used) for the duration of the COVID-19 declaration under Section 564(b)(1) of the Act, 21 U.S.C. section 360bbb-3(b)(1), unless the authorization is terminated or revoked.     Resp Syncytial Virus by PCR NEGATIVE  NEGATIVE Final    Comment: (NOTE) Fact Sheet for Patients: BloggerCourse.com  Fact Sheet for Healthcare Providers: SeriousBroker.it  This test is not yet approved or cleared by the [place]  FDA and has been authorized for detection and/or diagnosis of SARS-CoV-2 by FDA under an Emergency Use Authorization (EUA). This EUA will remain in effect (meaning this test can be used) for the duration of the COVID-19 declaration under Section 564(b)(1) of the Act, 21 U.S.C. section 360bbb-3(b)(1), unless the authorization is terminated or revoked.  Performed at [facility], [address] [place], [place] [address]   Respiratory (~20 pathogens) panel by PCR     Status: None   Collection Time: 08/15/24  1:30 PM   Specimen: Nasopharyngeal Swab; Respiratory  Result Value Ref Range Status   Adenovirus NOT DETECTED NOT DETECTED Final   Coronavirus 229E NOT DETECTED NOT DETECTED Final    Comment: (NOTE) The Coronavirus on the Respiratory Panel, DOES NOT test for the novel  Coronavirus (2019 nCoV)    Coronavirus HKU1 NOT DETECTED NOT DETECTED Final   Coronavirus NL63 NOT DETECTED NOT DETECTED Final   Coronavirus OC43 NOT DETECTED NOT DETECTED Final   Metapneumovirus NOT DETECTED NOT DETECTED Final   Rhinovirus / Enterovirus NOT DETECTED NOT DETECTED Final   Influenza A NOT DETECTED NOT DETECTED Final   Influenza B NOT DETECTED NOT DETECTED Final   Parainfluenza Virus 1 NOT DETECTED NOT DETECTED Final   Parainfluenza Virus 2 NOT DETECTED NOT DETECTED Final   Parainfluenza Virus 3 NOT DETECTED NOT DETECTED Final   Parainfluenza Virus 4 NOT DETECTED NOT DETECTED Final   Respiratory Syncytial Virus NOT DETECTED NOT DETECTED Final   Bordetella pertussis NOT DETECTED NOT DETECTED Final   Bordetella Parapertussis NOT DETECTED NOT DETECTED Final   Chlamydophila pneumoniae NOT DETECTED NOT DETECTED Final   Mycoplasma pneumoniae NOT DETECTED NOT  DETECTED Final    Comment: Performed at [facility], [address], [place], [place] [address]     Labs: BNP (last 3 results) No results for input(s): BNP in the last 8760 hours. Basic Metabolic Panel: Recent Labs  Lab 08/14/24 2310 08/16/24 0444 08/17/24 0321 08/19/24 0403  NA 133* 133* 136  --   K 3.7 4.0 4.3  --   CL 100 102 104  --   CO2 21* 22 23  --   GLUCOSE 183* 242* 314*  --   BUN 17 14 14   --   CREATININE 1.17* 0.72 0.73 0.87  CALCIUM  9.8 8.6* 9.1  --    Liver Function Tests: Recent Labs  Lab 08/14/24 2310  AST 18  ALT 11  ALKPHOS 53  BILITOT 0.8  PROT 7.4  ALBUMIN 3.3*   No results for input(s): LIPASE, AMYLASE in the last 168 hours. No results for input(s): AMMONIA in the last 168 hours. CBC: Recent Labs  Lab 08/14/24 2310 08/16/24 0444 08/16/24 1716 08/17/24 0321  WBC 19.7* 8.9  --  7.2  NEUTROABS 17.3*  --   --   --   HGB 11.3* 8.5* 8.9* 8.9*  HCT 33.6* 26.2* 27.8* 27.6*  MCV 87.7 89.7  --  89.9  PLT 247 196  --  221   Cardiac Enzymes: No results for input(s): CKTOTAL, CKMB, CKMBINDEX, TROPONINI in the last 168 hours. BNP: Invalid input(s): POCBNP CBG: Recent Labs  Lab 08/18/24 0726 08/18/24 1111 08/18/24 1628 08/18/24 2030 08/19/24 0734  GLUCAP 274* 350* 300* 327* 299*   D-Dimer No results for input(s): DDIMER in the last 72 hours. Hgb A1c No results for input(s): HGBA1C in the last 72 hours. Lipid Profile No results for input(s): CHOL, HDL, LDLCALC, TRIG, CHOLHDL, LDLDIRECT in the last 72 hours. Thyroid  function studies No results for input(s): TSH, T4TOTAL, T3FREE, THYROIDAB in the last 72 hours.  Invalid input(s): FREET3 Anemia work up Recent Labs    08/17/24 0321  VITAMINB12 300  FOLATE 7.9  FERRITIN 42  TIBC 264  IRON 13*  RETICCTPCT 2.1   Urinalysis    Component Value Date/Time   COLORURINE STRAW (A) 08/15/2024 0336   APPEARANCEUR CLEAR 08/15/2024 0336    APPEARANCEUR Clear 10/02/2021 1400   LABSPEC 1.003 (L) 08/15/2024 9663  PHURINE 6.0 08/15/2024 0336   GLUCOSEU NEGATIVE 08/15/2024 0336   HGBUR NEGATIVE 08/15/2024 0336   BILIRUBINUR NEGATIVE 08/15/2024 0336   BILIRUBINUR Negative 10/02/2021 1400   KETONESUR NEGATIVE 08/15/2024 0336   PROTEINUR NEGATIVE 08/15/2024 0336   UROBILINOGEN 0.2 04/03/2012 2144   NITRITE NEGATIVE 08/15/2024 0336   LEUKOCYTESUR NEGATIVE 08/15/2024 0336   Sepsis Labs Recent Labs  Lab 08/14/24 2310 08/16/24 0444 08/17/24 0321  WBC 19.7* 8.9 7.2   Microbiology Recent Results (from the past 240 hours)  Culture, blood (Routine x 2)     Status: None (Preliminary result)   Collection Time: 08/14/24 11:10 PM   Specimen: BLOOD LEFT ARM  Result Value Ref Range Status   Specimen Description   Final    BLOOD LEFT ARM Performed at [facility], [address] [place], [place] [address]    Special Requests   Final    BOTTLES DRAWN AEROBIC AND ANAEROBIC Blood Culture adequate volume Performed at [facility], [address] [place], [place] [address]    Culture   Final    NO GROWTH 4 DAYS Performed at [facility], [address] [place] [address], [place], [place] [address]    Report Status PENDING  Incomplete  Culture, blood (Routine x 2)     Status: None (Preliminary result)   Collection Time: 08/14/24 11:30 PM   Specimen: BLOOD RIGHT HAND  Result Value Ref Range Status   Specimen Description   Final    BLOOD RIGHT HAND Performed at [facility], [address] [place], [place] [address]    Special Requests   Final    BOTTLES DRAWN AEROBIC AND ANAEROBIC Blood Culture adequate volume Performed at [facility], [address], [place], [place] [address]    Culture   Final    NO GROWTH 4 DAYS Performed at [facility], [address], [place], [place] [address]    Report Status PENDING  Incomplete  Resp panel by RT-PCR (RSV, Flu A&B, Covid) Anterior Nasal Swab     Status: None   Collection Time: 08/14/24  11:43 PM   Specimen: Anterior Nasal Swab  Result Value Ref Range Status   SARS Coronavirus 2 by RT PCR NEGATIVE NEGATIVE Final    Comment: (NOTE) SARS-CoV-2 target nucleic acids are NOT DETECTED.  The SARS-CoV-2 RNA is generally detectable in upper respiratory specimens during the acute phase of infection. The lowest concentration of SARS-CoV-2 viral copies this assay can detect is 138 copies/mL. A negative result does not preclude SARS-Cov-2 infection and should not be used as the sole basis for treatment or other patient management decisions. A negative result may occur with  improper specimen collection/handling, submission of specimen other than nasopharyngeal swab, presence of viral mutation(s) within the areas targeted by this assay, and inadequate number of viral copies(<138 copies/mL). A negative result must be combined with clinical observations, patient history, and epidemiological information. The expected result is Negative.  Fact Sheet for Patients:  BloggerCourse.com  Fact Sheet for Healthcare Providers:  SeriousBroker.it  This test is no t yet approved or cleared by the [place]  FDA and  has been authorized for detection and/or diagnosis of SARS-CoV-2 by FDA under an Emergency Use Authorization (EUA). This EUA will remain  in effect (meaning this test can be used) for the duration of the COVID-19 declaration under Section 564(b)(1) of the Act, 21 U.S.C.section 360bbb-3(b)(1), unless the authorization is terminated  or revoked sooner.       Influenza A by PCR  NEGATIVE NEGATIVE Final   Influenza B by PCR NEGATIVE NEGATIVE Final    Comment: (NOTE) The Xpert Xpress SARS-CoV-2/FLU/RSV plus assay is intended as an aid in the diagnosis of influenza from Nasopharyngeal swab specimens and should not be used as a sole basis for treatment. Nasal washings and aspirates are unacceptable for Xpert Xpress  SARS-CoV-2/FLU/RSV testing.  Fact Sheet for Patients: BloggerCourse.com  Fact Sheet for Healthcare Providers: SeriousBroker.it  This test is not yet approved or cleared by the [place]  FDA and has been authorized for detection and/or diagnosis of SARS-CoV-2 by FDA under an Emergency Use Authorization (EUA). This EUA will remain in effect (meaning this test can be used) for the duration of the COVID-19 declaration under Section 564(b)(1) of the Act, 21 U.S.C. section 360bbb-3(b)(1), unless the authorization is terminated or revoked.     Resp Syncytial Virus by PCR NEGATIVE NEGATIVE Final    Comment: (NOTE) Fact Sheet for Patients: BloggerCourse.com  Fact Sheet for Healthcare Providers: SeriousBroker.it  This test is not yet approved or cleared by the [place]  FDA and has been authorized for detection and/or diagnosis of SARS-CoV-2 by FDA under an Emergency Use Authorization (EUA). This EUA will remain in effect (meaning this test can be used) for the duration of the COVID-19 declaration under Section 564(b)(1) of the Act, 21 U.S.C. section 360bbb-3(b)(1), unless the authorization is terminated or revoked.  Performed at [facility], [address], [place], [place] [address]   Respiratory (~20 pathogens) panel by PCR     Status: None   Collection Time: 08/15/24  1:30 PM   Specimen: Nasopharyngeal Swab; Respiratory  Result Value Ref Range Status   Adenovirus NOT DETECTED NOT DETECTED Final   Coronavirus 229E NOT DETECTED NOT DETECTED Final    Comment: (NOTE) The Coronavirus on the Respiratory Panel, DOES NOT test for the novel  Coronavirus (2019 nCoV)    Coronavirus HKU1 NOT DETECTED NOT DETECTED Final   Coronavirus NL63 NOT DETECTED NOT DETECTED Final   Coronavirus OC43 NOT DETECTED NOT DETECTED Final   Metapneumovirus NOT DETECTED NOT DETECTED Final   Rhinovirus  / Enterovirus NOT DETECTED NOT DETECTED Final   Influenza A NOT DETECTED NOT DETECTED Final   Influenza B NOT DETECTED NOT DETECTED Final   Parainfluenza Virus 1 NOT DETECTED NOT DETECTED Final   Parainfluenza Virus 2 NOT DETECTED NOT DETECTED Final   Parainfluenza Virus 3 NOT DETECTED NOT DETECTED Final   Parainfluenza Virus 4 NOT DETECTED NOT DETECTED Final   Respiratory Syncytial Virus NOT DETECTED NOT DETECTED Final   Bordetella pertussis NOT DETECTED NOT DETECTED Final   Bordetella Parapertussis NOT DETECTED NOT DETECTED Final   Chlamydophila pneumoniae NOT DETECTED NOT DETECTED Final   Mycoplasma pneumoniae NOT DETECTED NOT DETECTED Final    Comment: Performed at [facility], [address] [place], [place] [address]     Time coordinating discharge: 35 minutes  SIGNED:   Sophie Mao, MD  [facility] 08/19/2024, 10:09 AM

## 2024-08-19 NOTE — Care Management Important Message (Signed)
 Important Message  Patient Details  Name: Gabrielle Wood MRN: [id number] Date of Birth: 12/30/1945   Important Message Given:  Yes - Medicare IM     Cieara Stierwalt L Laruen Risser 08/19/2024, 11:42 AM

## 2024-08-19 NOTE — Plan of Care (Signed)
   Problem: Education: Goal: Knowledge of General Education information will improve Description Including pain rating scale, medication(s)/side effects and non-pharmacologic comfort measures Outcome: Progressing   Problem: Education: Goal: Knowledge of General Education information will improve Description Including pain rating scale, medication(s)/side effects and non-pharmacologic comfort measures Outcome: Progressing

## 2024-08-19 NOTE — TOC Progression Note (Signed)
 Transition of Care [facility]) - Progression Note    Patient Details  Name: Gabrielle Wood MRN: [id number] Date of Birth: [birth year]  [facility] CM/SW Contact  [name], LCSW Phone Number: 08/19/2024, 2:25 PM  Clinical Narrative:    Per pt's son hospital bed was not delivered to the home yesterday as was expected.  [employer] unable to provide reason for DME not being delivered. Per [employer] DME to be delivered today however, have been unable to provide a time frame for delivery. CSW awaiting ETA.    Expected Discharge Plan: Skilled Nursing Facility Barriers to Discharge: Barriers Resolved               Expected Discharge Plan and Services     Post Acute Care Choice: Durable Medical Equipment Living arrangements for the past 2 months: Single Family Home Expected Discharge Date: 08/19/24               DME Arranged: Hospital bed DME Agency: [facility] Date DME Agency Contacted: 08/19/24 Time DME Agency Contacted: [phone] Representative spoke with at DME Agency: Zack             Social Drivers of Health (SDOH) Interventions SDOH Screenings   Food Insecurity: No Food Insecurity (08/15/2024)  Housing: Low Risk  (08/15/2024)  Transportation Needs: No Transportation Needs (08/15/2024)  Utilities: Not At Risk (08/15/2024)  Depression (PHQ2-9): High Risk (11/01/2021)  Financial Resource Strain: Low Risk  (08/11/2024)   Received from [facility]  Physical Activity: Inactive (08/11/2024)   Received from [facility]  Social Connections: Socially Isolated (08/15/2024)  Stress: No Stress Concern Present (08/11/2024)   Received from [facility]  Tobacco Use: Medium Risk (08/14/2024)  Health Literacy: Medium Risk (08/11/2024)   Received from [facility]    Readmission Risk Interventions    08/19/2024    9:21 AM 08/17/2024    2:30 PM  Readmission Risk Prevention Plan  Post Dischage Appt Complete   Medication Screening Complete Complete  Transportation  Screening Complete Complete

## 2024-08-19 NOTE — Inpatient Diabetes Management (Signed)
 Inpatient Diabetes Program Recommendations  AACE/ADA: New Consensus Statement on Inpatient Glycemic Control   Target Ranges:  Prepandial:   less than 140 mg/dL      Peak postprandial:   less than 180 mg/dL (1-2 hours)      Critically ill patients:  140 - 180 mg/dL    Latest Reference Range & Units 08/18/24 07:26 08/18/24 11:11 08/18/24 16:28 08/18/24 20:30  Glucose-Capillary 70 - 99 mg/dL 725 (H) 649 (H) 699 (H) 327 (H)   Review of Glycemic Control  Diabetes history: DM2 Outpatient Diabetes medications: Tresiba  66 units at bedtime, Humalog  8 units TID Current orders for Inpatient glycemic control: Lantus   40 units daily, Novolog  11 units TID with meals, Novolog  0-15 units TID with meals, Novolog  0-5 units QHS   Inpatient Diabetes Program Recommendations:     Insulin : Per chart, NO meal coverage insulin  given on 08/18/24 (per [facility], patient ate less than 50% of meal so meal coverage not given). Please consider increasing Lantus  to 45 units daily and Novolog  correction to 0-20 units TID with meals.  Thanks, Earnie Gainer, RN, MSN, CDCES Diabetes Coordinator Inpatient Diabetes Program [phone] (Team Pager from 8am to 5pm)

## 2024-08-19 NOTE — TOC Transition Note (Addendum)
 Transition of Care [facility]) - Discharge Note   Patient Details  Name: Gabrielle Wood MRN: [id number] Date of Birth: [birth year]  Transition [facility]) CM/SW Contact:  [name], LCSW Phone Number: 08/19/2024, 9:26 AM   Clinical Narrative:    Pt to return home with home health PT/OT through [facility]. HH orders in place. CSW informed by Corean with [facility] that this pt is already active with their palliative care services. Referral to [facility] has been rescinded.  Hospital bed to be delivered to pt's home at 4:30pm. Pt's son to provide transportation for pt after bed is delivered.    Final next level of care: Home w Home Health Services Barriers to Discharge: Barriers Resolved   Patient Goals and CMS Choice Patient states their goals for this hospitalization and ongoing recovery are:: SNF for short-term rehab CMS Medicare.gov Compare Post Acute Care list provided to:: Patient Represenative (must comment) ([facility]) Choice offered to / list presented to : Adult Children [place] ownership interest in [facility].provided to:: Adult Children    Discharge Placement                       Discharge Plan and Services Additional resources added to the After Visit Summary for       Post Acute Care Choice: Durable Medical Equipment          DME Arranged: Hospital bed DME Agency: [facility] Date DME Agency Contacted: 08/19/24 Time DME Agency Contacted: [phone] Representative spoke with at DME Agency: Zack            Social Drivers of Health (SDOH) Interventions SDOH Screenings   Food Insecurity: No Food Insecurity (08/15/2024)  Housing: Low Risk  (08/15/2024)  Transportation Needs: No Transportation Needs (08/15/2024)  Utilities: Not At Risk (08/15/2024)  Depression (PHQ2-9): High Risk (11/01/2021)  Financial Resource Strain: Low Risk  (08/11/2024)   Received from [facility]  Physical Activity: Inactive (08/11/2024)   Received from [facility]   Social Connections: Socially Isolated (08/15/2024)  Stress: No Stress Concern Present (08/11/2024)   Received from [facility]  Tobacco Use: Medium Risk (08/14/2024)  Health Literacy: Medium Risk (08/11/2024)   [facility] from [facility]     Readmission Risk Interventions    08/19/2024    9:21 AM 08/17/2024    2:30 PM  Readmission Risk Prevention Plan  Post Dischage Appt Complete   Medication Screening Complete Complete  Transportation Screening Complete Complete

## 2024-08-19 NOTE — Progress Notes (Signed)
 Patient discharged home with instructions given on medications and follow up visits,patient and family verbalized understanding. Prescriptions sent to Pharmacy of choice documented on AVS . IV discontinued catheter intact.Accompanied by staff to an awaiting vehicle.

## 2024-08-21 DIAGNOSIS — J441 Chronic obstructive pulmonary disease with (acute) exacerbation: Secondary | ICD-10-CM | POA: Diagnosis not present

## 2024-08-23 DIAGNOSIS — J449 Chronic obstructive pulmonary disease, unspecified: Secondary | ICD-10-CM | POA: Diagnosis not present

## 2024-08-23 DIAGNOSIS — R531 Weakness: Secondary | ICD-10-CM | POA: Diagnosis not present

## 2024-08-26 ENCOUNTER — Encounter: Payer: Self-pay | Admitting: *Deleted

## 2024-08-26 DIAGNOSIS — Z6823 Body mass index (BMI) 23.0-23.9, adult: Secondary | ICD-10-CM | POA: Diagnosis not present

## 2024-08-26 DIAGNOSIS — J189 Pneumonia, unspecified organism: Secondary | ICD-10-CM | POA: Diagnosis not present

## 2024-08-26 DIAGNOSIS — N390 Urinary tract infection, site not specified: Secondary | ICD-10-CM | POA: Diagnosis not present

## 2024-09-01 DIAGNOSIS — E1165 Type 2 diabetes mellitus with hyperglycemia: Secondary | ICD-10-CM | POA: Diagnosis not present

## 2024-09-01 DIAGNOSIS — J449 Chronic obstructive pulmonary disease, unspecified: Secondary | ICD-10-CM | POA: Diagnosis not present

## 2024-09-01 DIAGNOSIS — F1320 Sedative, hypnotic or anxiolytic dependence, uncomplicated: Secondary | ICD-10-CM | POA: Diagnosis not present

## 2024-09-01 DIAGNOSIS — M545 Low back pain, unspecified: Secondary | ICD-10-CM | POA: Diagnosis not present

## 2024-09-01 DIAGNOSIS — Z299 Encounter for prophylactic measures, unspecified: Secondary | ICD-10-CM | POA: Diagnosis not present

## 2024-09-01 DIAGNOSIS — I10 Essential (primary) hypertension: Secondary | ICD-10-CM | POA: Diagnosis not present

## 2024-09-02 DIAGNOSIS — N390 Urinary tract infection, site not specified: Secondary | ICD-10-CM | POA: Diagnosis not present

## 2024-09-02 DIAGNOSIS — R300 Dysuria: Secondary | ICD-10-CM | POA: Diagnosis not present

## 2024-09-02 DIAGNOSIS — I10 Essential (primary) hypertension: Secondary | ICD-10-CM | POA: Diagnosis not present

## 2024-09-02 DIAGNOSIS — Z299 Encounter for prophylactic measures, unspecified: Secondary | ICD-10-CM | POA: Diagnosis not present

## 2024-09-08 ENCOUNTER — Telehealth: Payer: Self-pay | Admitting: Nurse Practitioner

## 2024-09-08 NOTE — Telephone Encounter (Signed)
 Does she need anymore labs for appt next week? I know she's been in and out of the hospital

## 2024-09-08 NOTE — Telephone Encounter (Signed)
 No, she wont need labs since she has had so much done in the hospital.

## 2024-09-10 ENCOUNTER — Ambulatory Visit: Admitting: Nurse Practitioner

## 2024-09-14 DIAGNOSIS — Z794 Long term (current) use of insulin: Secondary | ICD-10-CM | POA: Diagnosis not present

## 2024-09-14 DIAGNOSIS — Z515 Encounter for palliative care: Secondary | ICD-10-CM | POA: Diagnosis not present

## 2024-09-14 DIAGNOSIS — R5383 Other fatigue: Secondary | ICD-10-CM | POA: Diagnosis not present

## 2024-09-14 DIAGNOSIS — J449 Chronic obstructive pulmonary disease, unspecified: Secondary | ICD-10-CM | POA: Diagnosis not present

## 2024-09-14 DIAGNOSIS — M6281 Muscle weakness (generalized): Secondary | ICD-10-CM | POA: Diagnosis not present

## 2024-09-16 ENCOUNTER — Ambulatory Visit (INDEPENDENT_AMBULATORY_CARE_PROVIDER_SITE_OTHER): Admitting: Nurse Practitioner

## 2024-09-16 ENCOUNTER — Encounter: Payer: Self-pay | Admitting: Nurse Practitioner

## 2024-09-16 VITALS — BP 110/68 | HR 67 | Ht 67.0 in | Wt 158.4 lb

## 2024-09-16 DIAGNOSIS — E559 Vitamin D deficiency, unspecified: Secondary | ICD-10-CM | POA: Diagnosis not present

## 2024-09-16 DIAGNOSIS — Z794 Long term (current) use of insulin: Secondary | ICD-10-CM | POA: Diagnosis not present

## 2024-09-16 DIAGNOSIS — E11649 Type 2 diabetes mellitus with hypoglycemia without coma: Secondary | ICD-10-CM | POA: Diagnosis not present

## 2024-09-16 DIAGNOSIS — Z7984 Long term (current) use of oral hypoglycemic drugs: Secondary | ICD-10-CM | POA: Diagnosis not present

## 2024-09-16 DIAGNOSIS — E039 Hypothyroidism, unspecified: Secondary | ICD-10-CM | POA: Diagnosis not present

## 2024-09-16 MED ORDER — TRESIBA FLEXTOUCH 100 UNIT/ML ~~LOC~~ SOPN: 26.0000 [IU] | PEN_INJECTOR | Freq: Every day | SUBCUTANEOUS | 3 refills | Status: AC

## 2024-09-16 MED ORDER — INSULIN LISPRO (1 UNIT DIAL) 100 UNIT/ML (KWIKPEN): 8.0000 [IU] | PEN_INJECTOR | Freq: Three times a day (TID) | SUBCUTANEOUS | 3 refills | Status: AC

## 2024-09-16 MED ORDER — FREESTYLE LIBRE 3 PLUS SENSOR MISC: 3 refills | Status: AC

## 2024-09-16 MED ORDER — LEVOTHYROXINE SODIUM 125 MCG PO TABS: 125.0000 ug | ORAL_TABLET | Freq: Every day | ORAL | 3 refills | Status: DC

## 2024-09-16 NOTE — Progress Notes (Unsigned)
 Endocrinology Follow Up Note       09/17/2024, 7:15 AM   Subjective:    Patient ID: Gabrielle Wood, female    DOB: [birth year].  Gabrielle Wood is being seen in follow up after being seen in consultation for management of currently uncontrolled symptomatic diabetes requested by  Trudy Vaughn FALCON, MD.   Past Medical History:  Diagnosis Date   Arthritis    COPD (chronic obstructive pulmonary disease) (HCC)    Diabetes mellitus    DVT (deep venous thrombosis) (HCC)    years ago   GERD (gastroesophageal reflux disease)    H. pylori infection 2013   S/p treatment with amoxicillin , Biaxin, PPI   High cholesterol    Hypertension    Hypothyroidism    NSTEMI (non-ST elevated myocardial infarction) (HCC) 12/2020   s/p  PCI DES to the LCx   Thyroid  disease     Past Surgical History:  Procedure Laterality Date   BACK SURGERY     lumbar x3    ESOPHAGOGASTRODUODENOSCOPY  04/04/2012   Moderate gastritis/MODERATE Duodenitis in the duodenal bulb duodenum.  Pathology with chronic H. pylori gastritis.   EUS  05/14/2012   Normal pancreas, gallbladder,biliary tree/ Incidental finding of simple-appearing right renal cyst.   TONSILLECTOMY     TUBAL LIGATION     VEIN SURGERY     stripping years ago but recent laser surgery; bilateral legs    Social History   Socioeconomic History   Marital status: Widowed    Spouse name: Not on file   Number of children: 2   Years of education: Not on file   Highest education level: Not on file  Occupational History   Occupation: retired, Advertising copywriter at [employer]  Tobacco Use   Smoking status: Former    Current packs/day: 0.50    Types: Cigarettes   Smokeless tobacco: Never  Vaping Use   Vaping status: Never Used  Substance and Sexual Activity   Alcohol  use: No   Drug use: No   Sexual activity: Not on file  Other Topics Concern   Not on file  Social History Narrative    Not on file   Social Drivers of Health   Financial Resource Strain: Low Risk  (08/11/2024)   Received from [facility]   Overall Financial Resource Strain (CARDIA)    How hard is it for you to pay for the very basics like food, housing, medical care, and heating?: Not hard at all  Food Insecurity: No Food Insecurity (08/15/2024)   Hunger Vital Sign    Worried About Running Out of Food in the Last Year: Never true    Ran Out of Food in the Last Year: Never true  Transportation Needs: No Transportation Needs (08/15/2024)   PRAPARE - Administrator, Civil Service (Medical): No    Lack of Transportation (Non-Medical): No  Physical Activity: Inactive (08/11/2024)   Received from [facility]   Exercise Vital Sign    On average, how many days per week do you engage in moderate to strenuous exercise (like a brisk walk)?: 0 days    On average, how many minutes do you engage in  exercise at this level?: 0 min  Stress: No Stress Concern Present (08/11/2024)   Received from [facility] of Occupational [facility] - Occupational Stress Questionnaire    Do you feel stress - tense, restless, nervous, or anxious, or unable to sleep at night because your mind is troubled all the time - these days?: Only a little  Social Connections: Socially Isolated (08/15/2024)   Social Connection and Isolation Panel    Frequency of Communication with Friends and Family: Twice a week    Frequency of Social Gatherings with Friends and Family: Twice a week    Attends Religious Services: Never    Database administrator or Organizations: No    Attends Banker Meetings: Never    Marital Status: Widowed    Family History  Problem Relation Age of Onset   Pancreatic cancer Mother        diagnosed at age 54s, died from heart attack   Inflammatory bowel disease Brother        crohn's, his dgt deceased from colon cancer at age 49   Colon cancer Neg Hx     Outpatient  Encounter Medications as of 09/16/2024  Medication Sig   albuterol  (PROVENTIL ) (2.5 MG/3ML) 0.083% nebulizer solution Take 3 mLs (2.5 mg total) by nebulization every 6 (six) hours as needed for wheezing or shortness of breath.   albuterol  (VENTOLIN  HFA) 108 (90 Base) MCG/ACT inhaler Inhale 1-2 puffs into the lungs every 4 (four) hours as needed for wheezing or shortness of breath.   ALPRAZolam  (XANAX ) 1 MG tablet Take 0.5 tablets (0.5 mg total) by mouth at bedtime. 0.5 tablet in am and 0.5 tab at night   aspirin  EC 81 MG tablet Take 81 mg by mouth daily. Swallow whole.   atorvastatin  (LIPITOR) 80 MG tablet Take 80 mg by mouth every evening.   Continuous Glucose Sensor (FREESTYLE LIBRE 3 PLUS SENSOR) MISC Change sensor every 15 days.   Fluticasone-Umeclidin-Vilant (TRELEGY ELLIPTA) 100-62.5-25 MCG/ACT AEPB Inhale 1 puff into the lungs daily.   gabapentin  (NEURONTIN ) 100 MG capsule Take 1 capsule (100 mg total) by mouth 3 (three) times daily.   guaiFENesin (MUCINEX) 600 MG 12 hr tablet Take 600 mg by mouth 2 (two) times daily.   levothyroxine  (SYNTHROID ) 125 MCG tablet Take 1 tablet (125 mcg total) by mouth daily.   magnesium  oxide (MAG-OX) 400 (240 Mg) MG tablet Take 400 mg by mouth daily.   metoprolol  succinate (TOPROL -XL) 25 MG 24 hr tablet Take 25 mg by mouth daily.   mirabegron ER (MYRBETRIQ) 25 MG TB24 tablet Take 25 mg by mouth daily.   nitroGLYCERIN (NITROSTAT) 0.4 MG SL tablet Place 0.4 mg under the tongue every 5 (five) minutes as needed for chest pain.   pantoprazole  (PROTONIX ) 40 MG tablet Take 40 mg by mouth daily.   polyethylene glycol powder (GLYCOLAX /MIRALAX ) 17 GM/SCOOP powder Take 17 g by mouth daily as needed for mild constipation or moderate constipation. Dissolve 1 capful (17g) in 4-8 ounces of liquid and take by mouth daily.   ticagrelor  (BRILINTA ) 90 MG TABS tablet Take 90 mg by mouth 2 (two) times daily.   valsartan (DIOVAN) 80 MG tablet Take 40 mg by mouth daily.    [DISCONTINUED] insulin  degludec (TRESIBA  FLEXTOUCH) 100 UNIT/ML FlexTouch Pen Inject 66 Units into the skin at bedtime. (Patient taking differently: Inject 26 Units into the skin at bedtime.)   [DISCONTINUED] levothyroxine  (SYNTHROID ) 175 MCG tablet Take 175 mcg by mouth  daily before breakfast.   insulin  degludec (TRESIBA  FLEXTOUCH) 100 UNIT/ML FlexTouch Pen Inject 26 Units into the skin at bedtime.   insulin  lispro (HUMALOG  KWIKPEN) 100 UNIT/ML KwikPen Inject 8-14 Units into the skin 3 (three) times daily.   nitrofurantoin (MACRODANTIN) 50 MG capsule Take 50 mg by mouth at bedtime. (Patient not taking: Reported on 09/16/2024)   [DISCONTINUED] fexofenadine (ALLEGRA) 60 MG tablet Take by mouth.   [DISCONTINUED] insulin  lispro (HUMALOG  KWIKPEN) 100 UNIT/ML KwikPen Inject 8-14 Units into the skin 3 (three) times daily. (Patient taking differently: Inject 8 Units into the skin 3 (three) times daily.)   No facility-administered encounter medications on file as of 09/16/2024.    ALLERGIES: Allergies  Allergen Reactions   Amlodipine     Ciprocin-Fluocin-Procin [Fluocinolone]     Nightmares   Lipitor [Atorvastatin ] Other (See Comments)    Muscle aches, pain   Nebivolol    Nitrofuran Derivatives    Lisinopril Cough   Red Yeast Rice [Cholestin] Itching and Rash    VACCINATION STATUS: Immunization History  Administered Date(s) Administered   Pneumococcal Polysaccharide-23 04/04/2012    Diabetes She presents for her follow-up diabetic visit. She has type 2 diabetes mellitus. Onset time: Was diagnosed at approx age of 24. Her disease course has been fluctuating. There are no hypoglycemic associated symptoms. Associated symptoms include fatigue and foot paresthesias. Pertinent negatives for diabetes include no polydipsia, no polyuria and no weight loss. There are no hypoglycemic complications. Symptoms are improving. Diabetic complications include heart disease and peripheral neuropathy. (Had a MI  in the past, recently hospitalized for DKA) Risk factors for coronary artery disease include diabetes mellitus, dyslipidemia, hypertension, post-menopausal and sedentary lifestyle. Current diabetic treatment includes intensive insulin  program. She is compliant with treatment most of the time. Her weight is fluctuating minimally. She is following a generally healthy diet. When asked about meal planning, she reported none. She has not had a previous visit with a dietitian (Has appt with Santana after me today). She participates in exercise intermittently. Her home blood glucose trend is fluctuating dramatically. Her overall blood glucose range is >200 mg/dl. (She presents today, accompanied by family, with her CGM showing dramatically fluctuating glycemic profile.  Her most recent A1c on 9/14 was 9.7%, improving from last A1c of 10.7%.  She has been hospitalized several times since last visit for pneumonia and sepsis from UTI.  The hospitalist had changed her insulin  doses, but she did not go by those as it was way higher than what we suggested here and she was afraid of hypoglycemia (which she had on her previous dose at times).  Analysis of her CGM shows TIR 27%, TAR 73%, TBR 0%, with a GMI of 8.8%.  She denies any significant hypoglycemia in recent weeks.  She has been drinking nutritional supplements to help bring her strength back, which may be contributing to higher spikes with meals.) An ACE inhibitor/angiotensin II receptor blocker is being taken. She does not see a podiatrist.Eye exam is current.     Review of systems  Constitutional: + stable body weight,  current Body mass index is 24.81 kg/m. , + fatigue- improving, no subjective hyperthermia, no subjective hypothermia Eyes: no blurry vision, no xerophthalmia ENT: no sore throat, no nodules palpated in throat, no dysphagia/odynophagia, no hoarseness Cardiovascular: no chest pain, no shortness of breath, no palpitations, no leg  swelling Respiratory: no cough, no shortness of breath Gastrointestinal: no nausea/vomiting Musculoskeletal: + generalized weakness-using walker Skin: no rashes, no hyperemia Neurological: no tremors, +  numbness/tingling to BLE, no dizziness Psychiatric: no depression, no anxiety  Objective:     BP 110/68 (BP Location: Left Arm, Patient Position: Sitting, Cuff Size: Large)   Pulse 67   Ht 5' 7 (1.702 m)   Wt 158 lb 6.4 oz (71.8 kg)   BMI 24.81 kg/m   Wt Readings from Last 3 Encounters:  09/16/24 158 lb 6.4 oz (71.8 kg)  08/18/24 159 lb 2.8 oz (72.2 kg)  05/06/24 151 lb 12.8 oz (68.9 kg)     BP Readings from Last 3 Encounters:  09/16/24 110/68  08/19/24 139/77  05/06/24 100/64      Physical Exam- Limited  Constitutional:  Body mass index is 24.81 kg/m. , not in acute distress, normal state of mind Eyes:  EOMI, no exophthalmos Neck: Supple Musculoskeletal: no gross deformities, strength intact in all four extremities, no gross restriction of joint movements, walks with cane/walker Skin:  no rashes, no hyperemia Neurological: no tremor with outstretched hands   Diabetic Foot Exam - Simple   No data filed      CMP ( most recent) CMP     Component Value Date/Time   NA 136 08/17/2024 0321   NA 139 04/25/2021 0000   K 4.3 08/17/2024 0321   CL 104 08/17/2024 0321   CO2 23 08/17/2024 0321   GLUCOSE 314 (H) 08/17/2024 0321   BUN 14 08/17/2024 0321   BUN 12 10/21/2023 0000   CREATININE 0.87 08/19/2024 0403   CREATININE 0.78 09/20/2016 0923   CALCIUM  9.1 08/17/2024 0321   PROT 7.4 08/14/2024 2310   PROT 6.7 07/17/2017 0912   ALBUMIN 3.3 (L) 08/14/2024 2310   ALBUMIN 4.4 07/17/2017 0912   AST 18 08/14/2024 2310   ALT 11 08/14/2024 2310   ALKPHOS 53 08/14/2024 2310   BILITOT 0.8 08/14/2024 2310   BILITOT 0.5 07/17/2017 0912   GFRNONAA >60 08/19/2024 0403   GFRNONAA 83 07/10/2023 1107   GFRNONAA 77 09/20/2016 0923   GFRAA >60 08/21/2019 1539   GFRAA 88  09/20/2016 0923     Diabetic Labs (most recent): Lab Results  Component Value Date   HGBA1C 9.7 (H) 08/16/2024   HGBA1C 10.7 10/21/2023   HGBA1C 9.8 (A) 07/23/2023   MICROALBUR 150 02/21/2021      Lab Results  Component Value Date   TSH 8.15 (A) 04/13/2024   TSH 5.30 10/21/2023   TSH 1.68 01/30/2022   TSH 1.62 07/24/2021   TSH 11.00 (A) 04/25/2021   TSH 19.240 (H) 07/17/2017   TSH 1.91 09/20/2016   TSH 3.05 06/14/2016   TSH 2.86 03/12/2016   TSH 4.280 04/04/2012   FREET4 1.53 07/17/2017   FREET4 1.5 09/20/2016   FREET4 1.6 06/14/2016   FREET4 1.5 03/12/2016     Assessment & Plan:   1) Uncontrolled type 2 diabetes mellitus with circulatory disorder, with long-term current use of insulin  (HCC)  - Gabrielle PARAS Srey has currently uncontrolled symptomatic type 2 DM since 78 years of age.  She presents today, accompanied by family, with her CGM showing dramatically fluctuating glycemic profile.  Her most recent A1c on 9/14 was 9.7%, improving from last A1c of 10.7%.  She has been hospitalized several times since last visit for pneumonia and sepsis from UTI.  The hospitalist had changed her insulin  doses, but she did not go by those as it was way higher than what we suggested here and she was afraid of hypoglycemia (which she had on her previous dose at times).  Analysis  of her CGM shows TIR 27%, TAR 73%, TBR 0%, with a GMI of 8.8%.  She denies any significant hypoglycemia in recent weeks.  She has been drinking nutritional supplements to help bring her strength back, which may be contributing to higher spikes with meals.  -Recent labs reviewed.  - I had a long discussion with her about the progressive nature of diabetes and the pathology behind its complications. 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 These are all discussed in  detail with her.  - Nutritional counseling repeated/built upon at each appointment.  - The patient admits there is a room for improvement in their diet and drink choices. -  Suggestion is made for the patient to avoid simple carbohydrates from their diet including Cakes, Sweet Desserts / Pastries, Ice Cream, Soda (diet and regular), Sweet Tea, Candies, Chips, Cookies, Sweet Pastries, Store Bought Juices, Alcohol  in Excess of 1-2 drinks a day, Artificial Sweeteners, Coffee Creamer, and Sugar-free Products. This will help patient to have stable blood glucose profile and potentially avoid unintended weight gain.   - I encouraged the patient to switch to unprocessed or minimally processed complex starch and increased protein intake (animal or plant source), fruits, and vegetables.   - Patient is advised to stick to a routine mealtimes to eat 3 meals a day and avoid unnecessary snacks (to snack only to correct hypoglycemia).  - I have approached her with the following individualized plan to manage  her diabetes and patient agrees:   -She is advised to continue her Tresiba  26 units SQ nightly and continue Humalog  8-14 units if glucose is above 90 and she is eating (Specific instructions on how to titrate insulin  dosage based on glucose readings given to patient in writing).  We did adjust the timing of this medication.  I want her to start taking it 10-15 minutes before eating her meal to see if that helps prevent the sudden spike we have been seeing.  -she is encouraged to continue monitoring blood glucose 4 times daily (using her CGM), before meals and before bed, and to call the clinic if she has readings less than 70 or greater than 300 for 3 tests in a row.    - she is warned not to take insulin  without proper monitoring per orders. - Adjustment parameters are given to her for hypo and hyperglycemia in writing.  -She is not a candidate for incretin therapy due to body habitus.  - Specific  targets for  A1c;  LDL, HDL,  and Triglycerides were discussed with the patient.  2) Blood Pressure /Hypertension: Her BP is controlled to target.   she is advised to continue her current medication as prescribed by PCP.  3)  Weight/Diet:  her Body mass index is 24.81 kg/m.  -  she is not a candidate for weight loss.  Exercise, and detailed carbohydrates information provided  -  detailed on discharge instructions.  4) Hypothyroidism- unspecified  She is advised to continue her current dose of Levothyroxine  at 125 mcg po daily before breakfast (above her weight based max dose of 94 mcg).  Will recheck more thyroid  labs prior to next visit.   - The correct intake of thyroid  hormone (Levothyroxine , Synthroid ), is on empty stomach first thing in the morning, with water , separated by at least 30 minutes from breakfast and other medications,  and separated by more than 4 hours from calcium , iron, multivitamins, acid reflux medications (PPIs).  - This medication is a life-long medication and will be needed to correct thyroid  hormone imbalances for the rest of your life.  The dose may change from time to time, based on thyroid  blood work.  - It is extremely important to be consistent taking this medication, near the same time each morning.  -AVOID TAKING PRODUCTS CONTAINING BIOTIN (commonly found in Hair, Skin, Nails vitamins) AS IT INTERFERES WITH THE VALIDITY OF THYROID  FUNCTION BLOOD TESTS.   5) Chronic Care/Health Maintenance: -she is on ACEI/ARB and is encouraged to initiate and continue to follow up with Ophthalmology, Dentist, Podiatrist at least yearly or according to recommendations, and advised to stay away from smoking. I have recommended yearly flu vaccine and pneumonia vaccine at least every 5 years; moderate intensity exercise for up to 150 minutes weekly; and sleep for at least 7 hours a day.  - she is advised to maintain close follow up with Trudy Vaughn FALCON, MD for primary care  needs, as well as her other providers for optimal and coordinated care.     I spent  43  minutes in the care of the patient today including review of labs from CMP, Lipids, Thyroid  Function, Hematology (current and previous including abstractions from other facilities); face-to-face time discussing  her blood glucose readings/logs, discussing hypoglycemia and hyperglycemia episodes and symptoms, medications doses, her options of short and long term treatment based on the latest standards of care / guidelines;  discussion about incorporating lifestyle medicine;  and documenting the encounter. Risk reduction counseling performed per USPSTF guidelines to reduce obesity and cardiovascular risk factors.     Please refer to Patient Instructions for Blood Glucose Monitoring and Insulin /Medications Dosing Guide  in media tab for additional information. Please  also refer to  Patient Self Inventory in the Media  tab for reviewed elements of pertinent patient history.  Gabrielle Wood participated in the discussions, expressed understanding, and voiced agreement with the above plans.  All questions were answered to her satisfaction. she is encouraged to contact clinic should she have any questions or concerns prior to her return visit.   Follow up plan: - Return in about 3 months (around 12/17/2024) for Diabetes F/U with A1c in office, Bring meter and logs, Previsit labs.  Benton Rio, [facility] [address] [place], [place] [address] Phone: [phone] Fax: [phone]  09/17/2024, 7:15 AM

## 2024-09-16 NOTE — Patient Instructions (Signed)

## 2024-09-18 DIAGNOSIS — R531 Weakness: Secondary | ICD-10-CM | POA: Diagnosis not present

## 2024-09-18 DIAGNOSIS — J449 Chronic obstructive pulmonary disease, unspecified: Secondary | ICD-10-CM | POA: Diagnosis not present

## 2024-09-21 DIAGNOSIS — E119 Type 2 diabetes mellitus without complications: Secondary | ICD-10-CM | POA: Diagnosis not present

## 2024-09-21 DIAGNOSIS — I10 Essential (primary) hypertension: Secondary | ICD-10-CM | POA: Diagnosis not present

## 2024-09-21 DIAGNOSIS — Z299 Encounter for prophylactic measures, unspecified: Secondary | ICD-10-CM | POA: Diagnosis not present

## 2024-09-21 DIAGNOSIS — N390 Urinary tract infection, site not specified: Secondary | ICD-10-CM | POA: Diagnosis not present

## 2024-09-22 DIAGNOSIS — R531 Weakness: Secondary | ICD-10-CM | POA: Diagnosis not present

## 2024-09-23 NOTE — Progress Notes (Deleted)
 DEMIKA LANGENDERFER, female    DOB: [birth year]    MRN: [id number]   Brief patient profile:  79  yowf  *** referred to pulmonary clinic in [place]  09/24/2024 by *** for ***   Pt not previously seen by PCCM service.    Admit date: 08/14/2024 Discharge date: 08/19/2024   Admitted From: Home Disposition: Home   Recommendations for Outpatient Follow-up:  Follow up with PCP in 1 week with repeat CBC/BMP Outpatient follow-up with palliative care Follow up in ED if symptoms worsen or new appear     Home Health: D/OT Equipment/Devices: None   Discharge Condition: Stable CODE STATUS: DNR Diet recommendation: Heart healthy/carb modified   Brief/Interim Summary: 78 y.o. female with a history of COPD, chronic respiratory failure, diabetes mellitus type 2, hypertension, hyperlipidemia, hypothyroidism, GERD presented with weakness and failure to thrive and was found to have sepsis possibly from [facility] pneumonia.  She was started on IV antibiotics.  During the hospitalization, her condition has improved.  PT/OT recommended SNF but insurance denied.  She is currently medically stable for discharge.  She will be discharged home with home health PT/OT today.   Discharge Diagnoses:    Severe sepsis: Present on admission Possible community-acquired pneumonia: Present on admission Lactic acidosis: Resolved - Treated with broad-spectrum antibiotics: Today is day #5.  Sepsis has resolved.  Currently hemodynamically stable.  Cultures negative so far. -Discharge patient home today.  No need for any more antibiotics on discharge   AKI - Resolved.  Treated with IV fluids.  Outpatient follow-up   Failure to thrive - PT/OT recommended SNF but insurance denied.  She is currently medically stable for discharge.  She will be discharged home with home health PT/OT today.   Left hip pain - X-ray showed mild hip osteoarthritis.  Outpatient follow-up with orthopedic surgery as needed.  Continue  analgesic as needed   Acute metabolic encephalopathy Anxiety - Possibly from infection.  Mostly resolved.  Xanax  dose has been readjusted to nightly dose.   COPD -Stable.  No signs of exacerbation   Constipation--continue bowel regimen   Diabetes mellitus type 2 with hyperglycemia - Carb modified diet.  Continue long-acting insulin  along with short acting insulin  with meals.  Outpatient follow-up with PCP   Essential hypertension - Continue metoprolol .  Resume Diovan   CAD Hyperlipidemia - Stable.  Continue aspirin  and Brilinta  along with Lipitor   Anemia of chronic disease - From chronic illnesses.  Hemoglobin currently stable.  Monitor intermittently as an outpatient.     Leukocytosis -Resolved   Hypothyroidism -Continue Synthroid    GERD -Continue Protonix    Chronic pain Neuropathy - Continue reduced dose of Neurontin     History of Present Illness  09/24/2024  Pulmonary/ 1st office eval/ [facility] / [place] [facility]  No chief complaint on file.    Dyspnea:  *** Cough: *** Sleep: *** SABA use: *** 02: *** LDSCT:***  No obvious day to day or daytime pattern/variability or assoc excess/ purulent sputum or mucus plugs or hemoptysis or cp or chest tightness, subjective wheeze or overt sinus or hb symptoms.    Also denies any obvious fluctuation of symptoms with weather or environmental changes or other aggravating or alleviating factors except as outlined above   No unusual exposure hx or h/o childhood pna/ asthma or knowledge of premature birth.  Current Allergies, Complete Past Medical History, Past Surgical History, Family History, and Social History were reviewed in [employer] record.  ROS  The following are  not active complaints unless bolded Hoarseness, sore throat, dysphagia, dental problems, itching, sneezing,  nasal congestion or discharge of excess mucus or purulent secretions, ear ache,   fever, chills, sweats, unintended wt loss  or wt gain, classically pleuritic or exertional cp,  orthopnea pnd or arm/hand swelling  or leg swelling, presyncope, palpitations, abdominal pain, anorexia, nausea, vomiting, diarrhea  or change in bowel habits or change in bladder habits, change in stools or change in urine, dysuria, hematuria,  rash, arthralgias, visual complaints, headache, numbness, weakness or ataxia or problems with walking or coordination,  change in mood or  memory.            Outpatient Medications Prior to Visit  Medication Sig Dispense Refill   albuterol  (PROVENTIL ) (2.5 MG/3ML) 0.083% nebulizer solution Take 3 mLs (2.5 mg total) by nebulization every 6 (six) hours as needed for wheezing or shortness of breath. 84 each 12   albuterol  (VENTOLIN  HFA) 108 (90 Base) MCG/ACT inhaler Inhale 1-2 puffs into the lungs every 4 (four) hours as needed for wheezing or shortness of breath. 17 each 0   ALPRAZolam  (XANAX ) 1 MG tablet Take 0.5 tablets (0.5 mg total) by mouth at bedtime. 0.5 tablet in am and 0.5 tab at night     aspirin  EC 81 MG tablet Take 81 mg by mouth daily. Swallow whole.     atorvastatin  (LIPITOR) 80 MG tablet Take 80 mg by mouth every evening.     Continuous Glucose Sensor (FREESTYLE LIBRE 3 PLUS SENSOR) MISC Change sensor every 15 days. 6 each 3   Fluticasone-Umeclidin-Vilant (TRELEGY ELLIPTA) 100-62.5-25 MCG/ACT AEPB Inhale 1 puff into the lungs daily.     gabapentin  (NEURONTIN ) 100 MG capsule Take 1 capsule (100 mg total) by mouth 3 (three) times daily. 90 capsule 0   guaiFENesin (MUCINEX) 600 MG 12 hr tablet Take 600 mg by mouth 2 (two) times daily.     insulin  degludec (TRESIBA  FLEXTOUCH) 100 UNIT/ML FlexTouch Pen Inject 26 Units into the skin at bedtime. 25 mL 3   insulin  lispro (HUMALOG  KWIKPEN) 100 UNIT/ML KwikPen Inject 8-14 Units into the skin 3 (three) times daily. 36 mL 3   levothyroxine  (SYNTHROID ) 125 MCG tablet Take 1 tablet (125 mcg total) by mouth daily. 90 tablet 3   magnesium  oxide (MAG-OX) 400  (240 Mg) MG tablet Take 400 mg by mouth daily.     metoprolol  succinate (TOPROL -XL) 25 MG 24 hr tablet Take 25 mg by mouth daily.     mirabegron ER (MYRBETRIQ) 25 MG TB24 tablet Take 25 mg by mouth daily.     nitrofurantoin (MACRODANTIN) 50 MG capsule Take 50 mg by mouth at bedtime. (Patient not taking: Reported on 09/16/2024)     nitroGLYCERIN (NITROSTAT) 0.4 MG SL tablet Place 0.4 mg under the tongue every 5 (five) minutes as needed for chest pain.     pantoprazole  (PROTONIX ) 40 MG tablet Take 40 mg by mouth daily.     polyethylene glycol powder (GLYCOLAX /MIRALAX ) 17 GM/SCOOP powder Take 17 g by mouth daily as needed for mild constipation or moderate constipation. Dissolve 1 capful (17g) in 4-8 ounces of liquid and take by mouth daily.     ticagrelor  (BRILINTA ) 90 MG TABS tablet Take 90 mg by mouth 2 (two) times daily.     valsartan (DIOVAN) 80 MG tablet Take 40 mg by mouth daily.     No facility-administered medications prior to visit.    Past Medical History:  Diagnosis Date   Arthritis  COPD (chronic obstructive pulmonary disease) (HCC)    Diabetes mellitus    DVT (deep venous thrombosis) (HCC)    years ago   GERD (gastroesophageal reflux disease)    H. pylori infection 2013   S/p treatment with amoxicillin , Biaxin, PPI   High cholesterol    Hypertension    Hypothyroidism    NSTEMI (non-ST elevated myocardial infarction) (HCC) 12/2020   s/p  PCI DES to the LCx   Thyroid  disease       Objective:     There were no vitals taken for this visit.         Assessment

## 2024-09-24 ENCOUNTER — Ambulatory Visit: Admitting: Internal Medicine

## 2024-09-28 DIAGNOSIS — J441 Chronic obstructive pulmonary disease with (acute) exacerbation: Secondary | ICD-10-CM | POA: Diagnosis not present

## 2024-09-29 ENCOUNTER — Inpatient Hospital Stay (HOSPITAL_COMMUNITY)
Admission: EM | Admit: 2024-09-29 | Discharge: 2024-10-02 | DRG: 871 | Disposition: A | Discharge status: Home Health | Attending: Hospitalist | Admitting: Hospitalist

## 2024-09-29 ENCOUNTER — Emergency Department (HOSPITAL_COMMUNITY)

## 2024-09-29 ENCOUNTER — Other Ambulatory Visit: Payer: Self-pay

## 2024-09-29 ENCOUNTER — Encounter (HOSPITAL_COMMUNITY): Payer: Self-pay

## 2024-09-29 DIAGNOSIS — E1165 Type 2 diabetes mellitus with hyperglycemia: Secondary | ICD-10-CM | POA: Diagnosis present

## 2024-09-29 DIAGNOSIS — N179 Acute kidney failure, unspecified: Secondary | ICD-10-CM | POA: Diagnosis present

## 2024-09-29 DIAGNOSIS — J90 Pleural effusion, not elsewhere classified: Secondary | ICD-10-CM | POA: Diagnosis not present

## 2024-09-29 DIAGNOSIS — Z789 Other specified health status: Secondary | ICD-10-CM | POA: Diagnosis not present

## 2024-09-29 DIAGNOSIS — Z7902 Long term (current) use of antithrombotics/antiplatelets: Secondary | ICD-10-CM

## 2024-09-29 DIAGNOSIS — E119 Type 2 diabetes mellitus without complications: Secondary | ICD-10-CM

## 2024-09-29 DIAGNOSIS — I700 Atherosclerosis of aorta: Secondary | ICD-10-CM | POA: Diagnosis not present

## 2024-09-29 DIAGNOSIS — Z7189 Other specified counseling: Secondary | ICD-10-CM | POA: Diagnosis not present

## 2024-09-29 DIAGNOSIS — R4589 Other symptoms and signs involving emotional state: Secondary | ICD-10-CM | POA: Diagnosis not present

## 2024-09-29 DIAGNOSIS — N135 Crossing vessel and stricture of ureter without hydronephrosis: Secondary | ICD-10-CM

## 2024-09-29 DIAGNOSIS — Z515 Encounter for palliative care: Secondary | ICD-10-CM

## 2024-09-29 DIAGNOSIS — Z66 Do not resuscitate: Secondary | ICD-10-CM | POA: Diagnosis not present

## 2024-09-29 DIAGNOSIS — Z794 Long term (current) use of insulin: Secondary | ICD-10-CM | POA: Diagnosis not present

## 2024-09-29 DIAGNOSIS — Z8249 Family history of ischemic heart disease and other diseases of the circulatory system: Secondary | ICD-10-CM

## 2024-09-29 DIAGNOSIS — I252 Old myocardial infarction: Secondary | ICD-10-CM

## 2024-09-29 DIAGNOSIS — A419 Sepsis, unspecified organism: Secondary | ICD-10-CM | POA: Diagnosis not present

## 2024-09-29 DIAGNOSIS — I6782 Cerebral ischemia: Secondary | ICD-10-CM | POA: Diagnosis not present

## 2024-09-29 DIAGNOSIS — I2510 Atherosclerotic heart disease of native coronary artery without angina pectoris: Secondary | ICD-10-CM | POA: Diagnosis not present

## 2024-09-29 DIAGNOSIS — J168 Pneumonia due to other specified infectious organisms: Secondary | ICD-10-CM | POA: Diagnosis not present

## 2024-09-29 DIAGNOSIS — Z87891 Personal history of nicotine dependence: Secondary | ICD-10-CM

## 2024-09-29 DIAGNOSIS — G319 Degenerative disease of nervous system, unspecified: Secondary | ICD-10-CM | POA: Diagnosis not present

## 2024-09-29 DIAGNOSIS — K529 Noninfective gastroenteritis and colitis, unspecified: Secondary | ICD-10-CM | POA: Diagnosis not present

## 2024-09-29 DIAGNOSIS — Z79899 Other long term (current) drug therapy: Secondary | ICD-10-CM | POA: Diagnosis not present

## 2024-09-29 DIAGNOSIS — Z7982 Long term (current) use of aspirin: Secondary | ICD-10-CM | POA: Diagnosis not present

## 2024-09-29 DIAGNOSIS — J440 Chronic obstructive pulmonary disease with acute lower respiratory infection: Secondary | ICD-10-CM | POA: Diagnosis not present

## 2024-09-29 DIAGNOSIS — J189 Pneumonia, unspecified organism: Secondary | ICD-10-CM | POA: Diagnosis present

## 2024-09-29 DIAGNOSIS — R918 Other nonspecific abnormal finding of lung field: Secondary | ICD-10-CM | POA: Diagnosis not present

## 2024-09-29 DIAGNOSIS — N281 Cyst of kidney, acquired: Secondary | ICD-10-CM | POA: Diagnosis not present

## 2024-09-29 DIAGNOSIS — Z8701 Personal history of pneumonia (recurrent): Secondary | ICD-10-CM

## 2024-09-29 DIAGNOSIS — I10 Essential (primary) hypertension: Secondary | ICD-10-CM | POA: Diagnosis not present

## 2024-09-29 DIAGNOSIS — E7800 Pure hypercholesterolemia, unspecified: Secondary | ICD-10-CM | POA: Diagnosis present

## 2024-09-29 DIAGNOSIS — Z602 Problems related to living alone: Secondary | ICD-10-CM | POA: Diagnosis present

## 2024-09-29 DIAGNOSIS — Z881 Allergy status to other antibiotic agents status: Secondary | ICD-10-CM

## 2024-09-29 DIAGNOSIS — G9341 Metabolic encephalopathy: Secondary | ICD-10-CM | POA: Diagnosis present

## 2024-09-29 DIAGNOSIS — R519 Headache, unspecified: Secondary | ICD-10-CM | POA: Diagnosis not present

## 2024-09-29 DIAGNOSIS — N131 Hydronephrosis with ureteral stricture, not elsewhere classified: Secondary | ICD-10-CM | POA: Diagnosis present

## 2024-09-29 DIAGNOSIS — R531 Weakness: Secondary | ICD-10-CM

## 2024-09-29 DIAGNOSIS — R6520 Severe sepsis without septic shock: Secondary | ICD-10-CM | POA: Diagnosis not present

## 2024-09-29 DIAGNOSIS — Z7951 Long term (current) use of inhaled steroids: Secondary | ICD-10-CM

## 2024-09-29 DIAGNOSIS — N1330 Unspecified hydronephrosis: Secondary | ICD-10-CM | POA: Diagnosis not present

## 2024-09-29 DIAGNOSIS — Q6239 Other obstructive defects of renal pelvis and ureter: Secondary | ICD-10-CM | POA: Diagnosis not present

## 2024-09-29 DIAGNOSIS — Z955 Presence of coronary angioplasty implant and graft: Secondary | ICD-10-CM

## 2024-09-29 DIAGNOSIS — M069 Rheumatoid arthritis, unspecified: Secondary | ICD-10-CM | POA: Diagnosis not present

## 2024-09-29 DIAGNOSIS — E038 Other specified hypothyroidism: Secondary | ICD-10-CM

## 2024-09-29 DIAGNOSIS — Z79890 Hormone replacement therapy (postmenopausal): Secondary | ICD-10-CM

## 2024-09-29 DIAGNOSIS — E039 Hypothyroidism, unspecified: Secondary | ICD-10-CM | POA: Diagnosis present

## 2024-09-29 DIAGNOSIS — Z800 Family history of malignant neoplasm of digestive organs: Secondary | ICD-10-CM

## 2024-09-29 DIAGNOSIS — Z888 Allergy status to other drugs, medicaments and biological substances status: Secondary | ICD-10-CM

## 2024-09-29 DIAGNOSIS — Z1152 Encounter for screening for COVID-19: Secondary | ICD-10-CM

## 2024-09-29 HISTORY — DX: Acute kidney failure, unspecified: N17.9

## 2024-09-29 LAB — COMPREHENSIVE METABOLIC PANEL WITH GFR
ALT: 13 U/L (ref 0–44)
AST: 17 U/L (ref 15–41)
Albumin: 3.8 g/dL (ref 3.5–5.0)
Alkaline Phosphatase: 56 U/L (ref 38–126)
Anion gap: 13 (ref 5–15)
BUN: 29 mg/dL — ABNORMAL HIGH (ref 8–23)
CO2: 23 mmol/L (ref 22–32)
Calcium: 9.7 mg/dL (ref 8.9–10.3)
Chloride: 101 mmol/L (ref 98–111)
Creatinine, Ser: 1.24 mg/dL — ABNORMAL HIGH (ref 0.44–1.00)
GFR, Estimated: 44 mL/min — ABNORMAL LOW (ref >60)
Glucose, Bld: 270 mg/dL — ABNORMAL HIGH (ref 70–99)
Potassium: 3.8 mmol/L (ref 3.5–5.1)
Sodium: 136 mmol/L (ref 135–145)
Total Bilirubin: 0.6 mg/dL (ref 0.0–1.2)
Total Protein: 7.4 g/dL (ref 6.5–8.1)

## 2024-09-29 LAB — BLOOD GAS, VENOUS
Acid-Base Excess: 0 mmol/L (ref 0.0–2.0)
Bicarbonate: 24.8 mmol/L (ref 20.0–28.0)
Drawn by: 73776
O2 Saturation: 30.9 %
Patient temperature: 36.4
pCO2, Ven: 39 mmHg — ABNORMAL LOW (ref 44–60)
pH, Ven: 7.41 (ref 7.25–7.43)
pO2, Ven: <31 mmHg — CL (ref 32–45)

## 2024-09-29 LAB — CBC
HCT: 34.2 % — ABNORMAL LOW (ref 36.0–46.0)
Hemoglobin: 11.2 g/dL — ABNORMAL LOW (ref 12.0–15.0)
MCH: 28.1 pg (ref 26.0–34.0)
MCHC: 32.7 g/dL (ref 30.0–36.0)
MCV: 85.7 fL (ref 80.0–100.0)
Platelets: 182 K/uL (ref 150–400)
RBC: 3.99 MIL/uL (ref 3.87–5.11)
RDW: 15.2 % (ref 11.5–15.5)
WBC: 19.7 K/uL — ABNORMAL HIGH (ref 4.0–10.5)
nRBC: 0 % (ref 0.0–0.2)

## 2024-09-29 LAB — LACTIC ACID, PLASMA: Lactic Acid, Venous: 1.7 mmol/L (ref 0.5–1.9)

## 2024-09-29 LAB — CBG MONITORING, ED: Glucose-Capillary: 229 mg/dL — ABNORMAL HIGH (ref 70–99)

## 2024-09-29 LAB — TROPONIN T, HIGH SENSITIVITY
Troponin T High Sensitivity: 19 ng/L (ref 0–19)
Troponin T High Sensitivity: <15 ng/L (ref 0–19)

## 2024-09-29 LAB — RESP PANEL BY RT-PCR (RSV, FLU A&B, COVID)  RVPGX2
Influenza A by PCR: NEGATIVE
Influenza B by PCR: NEGATIVE
Resp Syncytial Virus by PCR: NEGATIVE
SARS Coronavirus 2 by RT PCR: NEGATIVE

## 2024-09-29 LAB — URINALYSIS, ROUTINE W REFLEX MICROSCOPIC
Bilirubin Urine: NEGATIVE
Glucose, UA: >=500 mg/dL — AB
Ketones, ur: NEGATIVE mg/dL
Leukocytes,Ua: NEGATIVE
Nitrite: NEGATIVE
Protein, ur: NEGATIVE mg/dL
Specific Gravity, Urine: 1.014 (ref 1.005–1.030)
pH: 6 (ref 5.0–8.0)

## 2024-09-29 LAB — GLUCOSE, CAPILLARY: Glucose-Capillary: 472 mg/dL — ABNORMAL HIGH (ref 70–99)

## 2024-09-29 MED ORDER — ASPIRIN 81 MG PO TBEC
81.0000 mg | DELAYED_RELEASE_TABLET | Freq: Every day | ORAL | Status: DC
  Administered 2024-09-30 – 2024-10-02 (×3): 81 mg via ORAL
  Filled 2024-09-29 (×3): qty 1

## 2024-09-29 MED ORDER — ENOXAPARIN SODIUM 40 MG/0.4ML IJ SOSY
40.0000 mg | PREFILLED_SYRINGE | INTRAMUSCULAR | Status: DC
  Administered 2024-09-29 – 2024-10-01 (×3): 40 mg via SUBCUTANEOUS
  Filled 2024-09-29 (×3): qty 0.4

## 2024-09-29 MED ORDER — INSULIN GLARGINE-YFGN 100 UNIT/ML ~~LOC~~ SOLN
26.0000 [IU] | Freq: Every day | SUBCUTANEOUS | Status: DC
  Administered 2024-09-29 – 2024-10-01 (×3): 26 [IU] via SUBCUTANEOUS
  Filled 2024-09-29 (×4): qty 0.26

## 2024-09-29 MED ORDER — IOHEXOL 300 MG/ML  SOLN
80.0000 mL | Freq: Once | INTRAMUSCULAR | Status: AC | PRN
  Administered 2024-09-29: 80 mL via INTRAVENOUS

## 2024-09-29 MED ORDER — INSULIN ASPART 100 UNIT/ML IJ SOLN
0.0000 [IU] | INTRAMUSCULAR | Status: DC
  Administered 2024-09-30: 5 [IU] via SUBCUTANEOUS
  Administered 2024-09-30: 11 [IU] via SUBCUTANEOUS
  Administered 2024-09-30: 15 [IU] via SUBCUTANEOUS
  Administered 2024-09-30: 8 [IU] via SUBCUTANEOUS
  Administered 2024-10-01: 15 [IU] via SUBCUTANEOUS
  Administered 2024-10-01: 5 [IU] via SUBCUTANEOUS
  Administered 2024-10-01: 3 [IU] via SUBCUTANEOUS
  Administered 2024-10-01: 5 [IU] via SUBCUTANEOUS
  Administered 2024-10-01: 11 [IU] via SUBCUTANEOUS
  Administered 2024-10-01: 8 [IU] via SUBCUTANEOUS
  Administered 2024-10-02: 3 [IU] via SUBCUTANEOUS
  Administered 2024-10-02: 8 [IU] via SUBCUTANEOUS

## 2024-09-29 MED ORDER — SODIUM CHLORIDE 0.9 % IV SOLN
2.0000 g | Freq: Once | INTRAVENOUS | Status: AC
  Administered 2024-09-29: 2 g via INTRAVENOUS
  Filled 2024-09-29: qty 12.5

## 2024-09-29 MED ORDER — LACTATED RINGERS IV BOLUS
1000.0000 mL | Freq: Once | INTRAVENOUS | Status: AC
  Administered 2024-09-29: 1000 mL via INTRAVENOUS

## 2024-09-29 MED ORDER — ONDANSETRON HCL 4 MG/2ML IJ SOLN: 4.0000 mg | Freq: Four times a day (QID) | INTRAMUSCULAR | Status: DC | PRN

## 2024-09-29 MED ORDER — SODIUM CHLORIDE 0.9 % IV SOLN: 1.0000 g | Freq: Once | INTRAVENOUS | Status: DC

## 2024-09-29 MED ORDER — SODIUM CHLORIDE 0.9 % IV SOLN
500.0000 mg | INTRAVENOUS | Status: DC
  Administered 2024-09-29 – 2024-10-01 (×3): 500 mg via INTRAVENOUS
  Filled 2024-09-29 (×3): qty 5

## 2024-09-29 MED ORDER — ACETAMINOPHEN 325 MG PO TABS: 650.0000 mg | ORAL_TABLET | Freq: Four times a day (QID) | ORAL | Status: DC | PRN

## 2024-09-29 MED ORDER — ONDANSETRON HCL 4 MG PO TABS: 4.0000 mg | ORAL_TABLET | Freq: Four times a day (QID) | ORAL | Status: DC | PRN

## 2024-09-29 MED ORDER — METRONIDAZOLE 500 MG/100ML IV SOLN
500.0000 mg | Freq: Two times a day (BID) | INTRAVENOUS | Status: DC
  Administered 2024-09-30 – 2024-10-02 (×5): 500 mg via INTRAVENOUS
  Filled 2024-09-29 (×5): qty 100

## 2024-09-29 MED ORDER — LACTATED RINGERS IV SOLN: INTRAVENOUS | Status: AC

## 2024-09-29 MED ORDER — BUDESON-GLYCOPYRROL-FORMOTEROL 160-9-4.8 MCG/ACT IN AERO
2.0000 | INHALATION_SPRAY | Freq: Two times a day (BID) | RESPIRATORY_TRACT | Status: DC
Start: 2024-09-29 — End: 2024-10-02
  Administered 2024-09-29 – 2024-10-02 (×6): 2 via RESPIRATORY_TRACT
  Filled 2024-09-29: qty 5.9

## 2024-09-29 MED ORDER — METRONIDAZOLE 500 MG/100ML IV SOLN
500.0000 mg | Freq: Once | INTRAVENOUS | Status: AC
  Administered 2024-09-29: 500 mg via INTRAVENOUS
  Filled 2024-09-29: qty 100

## 2024-09-29 MED ORDER — INSULIN ASPART 100 UNIT/ML IJ SOLN: 0.0000 [IU] | Freq: Three times a day (TID) | INTRAMUSCULAR | Status: DC

## 2024-09-29 MED ORDER — ATORVASTATIN CALCIUM 40 MG PO TABS
80.0000 mg | ORAL_TABLET | Freq: Every evening | ORAL | Status: DC
  Administered 2024-09-29 – 2024-10-01 (×3): 80 mg via ORAL
  Filled 2024-09-29 (×3): qty 2

## 2024-09-29 MED ORDER — LACTATED RINGERS IV SOLN: INTRAVENOUS | Status: DC

## 2024-09-29 MED ORDER — POLYETHYLENE GLYCOL 3350 17 G PO PACK: 17.0000 g | PACK | Freq: Every day | ORAL | Status: DC | PRN

## 2024-09-29 MED ORDER — ACETAMINOPHEN 650 MG RE SUPP: 650.0000 mg | Freq: Four times a day (QID) | RECTAL | Status: DC | PRN

## 2024-09-29 MED ORDER — INSULIN ASPART 100 UNIT/ML IJ SOLN
10.0000 [IU] | Freq: Once | INTRAMUSCULAR | Status: AC
  Administered 2024-09-29: 10 [IU] via SUBCUTANEOUS

## 2024-09-29 MED ORDER — SODIUM CHLORIDE 0.9 % IV SOLN
2.0000 g | INTRAVENOUS | Status: DC
  Administered 2024-09-30 – 2024-10-01 (×3): 2 g via INTRAVENOUS
  Filled 2024-09-29 (×3): qty 20

## 2024-09-29 MED ORDER — INSULIN ASPART 100 UNIT/ML IJ SOLN: 0.0000 [IU] | Freq: Every day | INTRAMUSCULAR | Status: DC

## 2024-09-29 MED ORDER — VANCOMYCIN HCL IN DEXTROSE 1-5 GM/200ML-% IV SOLN
1000.0000 mg | Freq: Once | INTRAVENOUS | Status: AC
  Administered 2024-09-29: 1000 mg via INTRAVENOUS
  Filled 2024-09-29: qty 200

## 2024-09-29 MED ORDER — ALPRAZOLAM 0.5 MG PO TABS
0.5000 mg | ORAL_TABLET | Freq: Two times a day (BID) | ORAL | Status: DC
  Administered 2024-09-29 – 2024-10-02 (×6): 0.5 mg via ORAL
  Filled 2024-09-29 (×6): qty 1

## 2024-09-29 MED ORDER — INSULIN ASPART 100 UNIT/ML IJ SOLN
15.0000 [IU] | Freq: Once | INTRAMUSCULAR | Status: AC
  Administered 2024-09-29: 15 [IU] via SUBCUTANEOUS

## 2024-09-29 MED ORDER — LEVOTHYROXINE SODIUM 125 MCG PO TABS
125.0000 ug | ORAL_TABLET | Freq: Every day | ORAL | Status: DC
  Administered 2024-09-30 – 2024-10-02 (×3): 125 ug via ORAL
  Filled 2024-09-29 (×3): qty 1

## 2024-09-29 MED ORDER — TICAGRELOR 90 MG PO TABS
90.0000 mg | ORAL_TABLET | Freq: Two times a day (BID) | ORAL | Status: DC
  Administered 2024-09-29 – 2024-10-02 (×6): 90 mg via ORAL
  Filled 2024-09-29 (×6): qty 1

## 2024-09-29 NOTE — Progress Notes (Signed)
 Called Maureen of laboratory to add urine culture to earlier UA done.

## 2024-09-29 NOTE — Progress Notes (Deleted)
   09/29/24 2231  BiPAP/CPAP/SIPAP  $ Face Mask Medium Yes  BiPAP/CPAP/SIPAP DREAMSTATIOND  Reason BIPAP/CPAP not in use Other(comment)   Attempted to place patient on CPAP for the night; started him on basic settings with a nasal CPAP mask (patient states he has a prescription to take to [facility] pharmacy to get his CPAP equipment but hasn't done it yet). Patient was unable to tolerated 5 cmH2O starting pressure with nasal mask and did not want to try the full face. Left unit at bedside in case he changes his mind.

## 2024-09-29 NOTE — ED Triage Notes (Signed)
 Pt arrived via POV from home with a friend who reports Pts symptoms began yesterday. Pt reports she started off her day yesterday with a really bad headache, then developed weakness. The Pts friend also reports the Pt had slurred speech yesterday as well.

## 2024-09-29 NOTE — ED Provider Notes (Signed)
 [place] EMERGENCY DEPARTMENT AT [facility] Provider Note   CSN: [id number] Arrival date & time: 09/29/24  1219     Patient presents with: Weakness   Gabrielle Wood is a 78 y.o. female.   Patient is a 78 year old female who presents to the emergency department with a chief complaint of generalized malaise and fatigue, altered mental status, weakness which has been ongoing since yesterday.  She is accompanied by family who does note that she was confused yesterday and has continued today and did seem to have some slurred speech yesterday.  Patient has had no focal weakness.  Patient denies any associated syncopal event.  She does note that she has had some pain to the left side of her chest as well as the right side of her abdomen.  She has had no associated shortness of breath.  She has had a mild cough.   Weakness      Prior to Admission medications   Medication Sig Start Date End Date Taking? Authorizing Provider  albuterol  (PROVENTIL ) (2.5 MG/3ML) 0.083% nebulizer solution Take 3 mLs (2.5 mg total) by nebulization every 6 (six) hours as needed for wheezing or shortness of breath. 09/30/23   Ula Prentice SAUNDERS, MD  albuterol  (VENTOLIN  HFA) 108 [phone] Base) MCG/ACT inhaler Inhale 1-2 puffs into the lungs every 4 (four) hours as needed for wheezing or shortness of breath. 09/30/23   Ula Prentice SAUNDERS, MD  ALPRAZolam  (XANAX ) 1 MG tablet Take 0.5 tablets (0.5 mg total) by mouth at bedtime. 0.5 tablet in am and 0.5 tab at night 08/19/24   Cheryle Page, MD  aspirin  EC 81 MG tablet Take 81 mg by mouth daily. Swallow whole.    [provider]  atorvastatin  (LIPITOR) 80 MG tablet Take 80 mg by mouth every evening. 11/24/21   [provider]  Continuous Glucose Sensor (FREESTYLE LIBRE 3 PLUS SENSOR) MISC Change sensor every 15 days. 09/16/24   Therisa Benton JINNY, NP  Fluticasone-Umeclidin-Vilant (TRELEGY ELLIPTA) 100-62.5-25 MCG/ACT AEPB Inhale 1 puff into the lungs  daily.    [provider]  gabapentin  (NEURONTIN ) 100 MG capsule Take 1 capsule (100 mg total) by mouth 3 (three) times daily. 08/19/24   Cheryle Page, MD  guaiFENesin (MUCINEX) 600 MG 12 hr tablet Take 600 mg by mouth 2 (two) times daily.    [provider]  insulin  degludec (TRESIBA  FLEXTOUCH) 100 UNIT/ML FlexTouch Pen Inject 26 Units into the skin at bedtime. 09/16/24   Therisa Benton JINNY, NP  insulin  lispro (HUMALOG  KWIKPEN) 100 UNIT/ML KwikPen Inject 8-14 Units into the skin 3 (three) times daily. 09/16/24   Therisa Benton JINNY, NP  levothyroxine  (SYNTHROID ) 125 MCG tablet Take 1 tablet (125 mcg total) by mouth daily. 09/16/24   Therisa Benton JINNY, NP  magnesium  oxide (MAG-OX) 400 (240 Mg) MG tablet Take 400 mg by mouth daily.    [provider]  metoprolol  succinate (TOPROL -XL) 25 MG 24 hr tablet Take 25 mg by mouth daily.    [provider]  mirabegron ER (MYRBETRIQ) 25 MG TB24 tablet Take 25 mg by mouth daily.    [provider]  nitrofurantoin (MACRODANTIN) 50 MG capsule Take 50 mg by mouth at bedtime. Patient not taking: Reported on 09/16/2024    [provider]  nitroGLYCERIN (NITROSTAT) 0.4 MG SL tablet Place 0.4 mg under the tongue every 5 (five) minutes as needed for chest pain.    [provider]  pantoprazole  (PROTONIX ) 40 MG tablet Take 40  mg by mouth daily. 06/06/21   [provider]  polyethylene glycol powder (GLYCOLAX /MIRALAX ) 17 GM/SCOOP powder Take 17 g by mouth daily as needed for mild constipation or moderate constipation. Dissolve 1 capful (17g) in 4-8 ounces of liquid and take by mouth daily.    [provider]  ticagrelor  (BRILINTA ) 90 MG TABS tablet Take 90 mg by mouth 2 (two) times daily.    [provider]  valsartan (DIOVAN) 80 MG tablet Take 40 mg by mouth daily.    [provider]  fexofenadine (ALLEGRA) 60 MG tablet Take by mouth.  04/13/21  [provider]     Allergies: Amlodipine , Ciprocin-fluocin-procin [fluocinolone], Lipitor [atorvastatin ], Nebivolol, Nitrofuran derivatives, Lisinopril, and Red yeast rice [cholestin]    Review of Systems  Neurological:  Positive for weakness.  All other systems reviewed and are negative.   Updated Vital Signs BP (!) 119/56   Pulse 86   Temp (!) 97.5 F (36.4 C) (Oral)   Resp (!) 21   Ht 5' 7 (1.702 m)   Wt 66.7 kg   SpO2 96%   BMI 23.02 kg/m   Physical Exam Vitals and nursing note reviewed.  Constitutional:      General: She is not in acute distress.    Appearance: Normal appearance. She is not ill-appearing.  HENT:     Head: Normocephalic and atraumatic.     Nose: Nose normal. No congestion or rhinorrhea.     Mouth/Throat:     Mouth: Mucous membranes are moist.  Eyes:     Extraocular Movements: Extraocular movements intact.     Conjunctiva/sclera: Conjunctivae normal.     Pupils: Pupils are equal, round, and reactive to light.  Cardiovascular:     Rate and Rhythm: Normal rate and regular rhythm.     Pulses: Normal pulses.     Heart sounds: Normal heart sounds. No murmur heard.    No gallop.  Pulmonary:     Effort: Pulmonary effort is normal. No respiratory distress.     Breath sounds: Normal breath sounds. No stridor. No wheezing, rhonchi or rales.  Abdominal:     General: Abdomen is flat. Bowel sounds are normal. There is no distension.     Palpations: Abdomen is soft.     Tenderness: There is no guarding.     Comments: Tender to palpation to the right side of the abdomen  Musculoskeletal:        General: No swelling, tenderness, deformity or signs of injury. Normal range of motion.     Cervical back: Normal range of motion and neck supple. No rigidity or tenderness.  Skin:    General: Skin is warm and dry.     Findings: No bruising or rash.  Neurological:     General: No focal deficit present.     Mental Status: She is alert and oriented to person, place, and time.  Mental status is at baseline.  Psychiatric:        Mood and Affect: Mood normal.        Behavior: Behavior normal.        Thought Content: Thought content normal.        Judgment: Judgment normal.     (all labs ordered are listed, but only abnormal results are displayed) Labs Reviewed  COMPREHENSIVE METABOLIC PANEL WITH GFR - Abnormal; Notable for the following components:      Result Value   Glucose, Bld 270 (*)    BUN 29 (*)  Creatinine, Ser 1.24 (*)    GFR, Estimated 44 (*)    All other components within normal limits  CBC - Abnormal; Notable for the following components:   WBC 19.7 (*)    Hemoglobin 11.2 (*)    HCT 34.2 (*)    All other components within normal limits  BLOOD GAS, VENOUS - Abnormal; Notable for the following components:   pCO2, Ven 39 (*)    pO2, Ven <31 (*)    All other components within normal limits  CBG MONITORING, ED - Abnormal; Notable for the following components:   Glucose-Capillary 229 (*)    All other components within normal limits  RESP PANEL BY RT-PCR (RSV, FLU A&B, COVID)  RVPGX2  CULTURE, BLOOD (ROUTINE X 2)  CULTURE, BLOOD (ROUTINE X 2)  LACTIC ACID, PLASMA  URINALYSIS, ROUTINE W REFLEX MICROSCOPIC  TROPONIN T, HIGH SENSITIVITY  TROPONIN T, HIGH SENSITIVITY    EKG: EKG Interpretation Date/Time:  Tuesday September 29 2024 12:58:47 EDT Ventricular Rate:  81 PR Interval:  154 QRS Duration:  88 QT Interval:  385 QTC Calculation: 447 R Axis:   -70  Text Interpretation: Sinus rhythm Left anterior fascicular block Abnormal R-wave progression, early transition Abnormal T, consider ischemia, lateral leads No significant change since last tracing Confirmed by Towana Sharper [phone]) on 09/29/2024 3:09:17 PM  Radiology: CT CHEST ABDOMEN PELVIS W CONTRAST Result Date: 09/29/2024 CLINICAL DATA:  Left-sided chest pain right lower quadrant abdominal pain EXAM: CT CHEST, ABDOMEN, AND PELVIS WITH CONTRAST TECHNIQUE: Multidetector CT imaging of  the chest, abdomen and pelvis was performed following the standard protocol during bolus administration of intravenous contrast. RADIATION DOSE REDUCTION: This exam was performed according to the departmental dose-optimization program which includes automated exposure control, adjustment of the mA and/or kV according to patient size and/or use of iterative reconstruction technique. CONTRAST:  80mL OMNIPAQUE  IOHEXOL  300 MG/ML  SOLN COMPARISON:  CT 08/15/2024, 01/06/2024, CT 11/04/2022, 07/05/2022, 10/14/2021 FINDINGS: CT CHEST FINDINGS Cardiovascular: Moderate aortic atherosclerosis. No aneurysm. Multi vessel coronary vascular calcification. Normal cardiac size. No pericardial effusion Mediastinum/Nodes: Patent trachea. No thyroid  mass. Borderline mediastinal lymph nodes as before, AP window nodes up to 11 mm. Small hiatal hernia Lungs/Pleura: Mild diffuse septal thickening. Apical scarring. Bronchial wall thickening and persistent subpleural left greater than right consolidation as compared with CT from last month. Atelectasis or small focus of consolidation at the lingula, slightly progressive. Trace pleural effusions. Musculoskeletal: Sternum appears intact. No acute or suspicious osseous abnormality. CT ABDOMEN PELVIS FINDINGS Hepatobiliary: No focal liver abnormality is seen. No gallstones, gallbladder wall thickening, or biliary dilatation. Pancreas: Unremarkable. No pancreatic ductal dilatation or surrounding inflammatory changes. Spleen: Normal in size without focal abnormality. Adrenals/Urinary Tract: Adrenal glands are normal. Renal cysts for which no imaging follow-up is recommended. Moderate right-sided hydronephrosis to the level of the UPJ stable to slightly progressive compared with most recent prior CT. Possible mild urothelial thickening of proximal right ureter, series 2, image 91. Distended urinary bladder. Right ureter decompressed. Stomach/Bowel: Stomach is nondistended. No dilated small bowel.  Negative appendix. Question mild wall thickening of ascending colon with slight mucosal enhancement, series 2, image 91 through 99. Not much surrounding stranding. Vascular/Lymphatic: Aortic atherosclerosis. No enlarged abdominal or pelvic lymph nodes. Reproductive: Uterus and bilateral adnexa are unremarkable. Calcified uterine fibroid Other: Negative for pelvic effusion or free air Musculoskeletal: No acute or suspicious osseous abnormality. IMPRESSION: 1. Bronchial wall thickening and persistent subpleural left greater than right consolidation and trace pleural effusions as compared  with CT from last month. Atelectasis or small focus of consolidation at the lingula, slightly progressive. Findings could be secondary to infection or potential aspiration. 2. Moderate right-sided hydronephrosis to the level of the UPJ, stable to slightly progressive compared with most recent prior CT. Possible mild urothelial thickening of proximal right ureter, question mild inflammatory or infectious process. UPJ obstruction/proximal ureteral stricture also considered. Recommend urology consultation if not already performed. 3. Possible mild wall thickening of ascending colon with slight mucosal enhancement but no significant surrounding edema or inflammation. Correlate for colitis type symptoms. 4. Aortic atherosclerosis. Aortic Atherosclerosis (ICD10-I70.0). Electronically Signed   By: Luke Bun M.D.   On: 09/29/2024 15:33   CT Head Wo Contrast Result Date: 09/29/2024 CLINICAL DATA:  Headache EXAM: CT HEAD WITHOUT CONTRAST TECHNIQUE: Contiguous axial images were obtained from the base of the skull through the vertex without intravenous contrast. RADIATION DOSE REDUCTION: This exam was performed according to the departmental dose-optimization program which includes automated exposure control, adjustment of the mA and/or kV according to patient size and/or use of iterative reconstruction technique. COMPARISON:  CT brain  12/08/2021, 12/17/2023 FINDINGS: Brain: No acute territorial infarction, hemorrhage or intracranial mass.atrophy and mild chronic small vessel ischemic changes of the white matter. 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 Stable ventricle size. Vascular: No hyperdense vessels.  Carotid vascular calcification Skull: Normal. Negative for fracture or focal lesion. Sinuses/Orbits: No acute finding. Other: None IMPRESSION: 1. No CT evidence for acute intracranial abnormality. 2. Atrophy and chronic small vessel ischemic changes of the white matter. 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 Electronically Signed   By: Luke Bun M.D.   On: 09/29/2024 15:16   DG Chest Port 1 View Result Date: 09/29/2024 CLINICAL DATA:  Weakness EXAM: PORTABLE CHEST 1 VIEW COMPARISON:  Chest radiograph dated 08/14/2024 FINDINGS: Normal lung volumes. Patchy left basilar opacity. Unchanged blunting of the left costophrenic angle. No pneumothorax. The heart size and mediastinal contours are within normal limits. No acute osseous abnormality. IMPRESSION: 1. Patchy left basilar opacity, likely atelectasis. 2. Unchanged blunting of the left costophrenic angle, which may represent a small pleural effusion. Electronically Signed   By: Limin  Xu M.D.   On: 09/29/2024 13:13     Procedures   Medications Ordered in the ED  lactated ringers  infusion (has no administration in time range)  lactated ringers  bolus 1,000 mL (0 mLs Intravenous Stopped 09/29/24 1425)  lactated ringers  bolus 1,000 mL (0 mLs Intravenous Stopped 09/29/24 1412)  metroNIDAZOLE  (FLAGYL ) IVPB 500 mg (0 mg Intravenous Stopped 09/29/24 1607)  ceFEPIme  (MAXIPIME ) 2 g in sodium chloride  0.9 % 100 mL IVPB (0 g Intravenous Stopped 09/29/24 1607)  vancomycin  (VANCOCIN ) IVPB 1000 mg/200 mL premix (0 mg Intravenous Stopped 09/29/24 1607)  iohexol  (OMNIPAQUE ) 300 MG/ML solution 80 mL (80 mLs Intravenous Contrast Given 09/29/24 1441)                                     Medical Decision Making Amount and/or Complexity of Data Reviewed Labs: ordered. Radiology: ordered.  Risk Prescription drug management. Decision regarding hospitalization.   This patient presents to the ED for concern of weakness, altered mental status, cough, this involves an extensive number of treatment options, and is a complaint that carries with it a high risk of complications and morbidity.  The differential diagnosis includes sepsis, pneumonia, urinary tract infection, acute kidney injury, pyelonephritis, kidney stone, ACS,  pulmonary embolus   Co morbidities that complicate the patient evaluation  Pneumonia, diabetes, pancreatitis   Additional history obtained:  Additional history obtained from family External records from outside source obtained and reviewed including medical records   Lab Tests:  I Ordered, and personally interpreted labs.  The pertinent results include: Leukocytosis, anemia at baseline, elevated creatinine, elevated glucose, normal electrolytes and liver function, unremarkable urinalysis, normal lactic acid, negative serial troponins, negative viral swab   Imaging Studies ordered:  I ordered imaging studies including CT scan chest abdomen and pelvis, CT scan head, chest x-ray I independently visualized and interpreted imaging which showed bronchial wall thickening, small focus of consolidation at the lingula, right-sided hydronephrosis, wall thickening of the ascending colon I agree with the radiologist interpretation   Cardiac Monitoring: / EKG:  The patient was maintained on a cardiac monitor.  I personally viewed and interpreted the cardiac monitored which showed an underlying rhythm of: Normal sinus rhythm, no ST/T wave changes, no ischemic changes, no STEMI   Consultations Obtained:  I requested consultation with the hospitalist,  and discussed lab and imaging findings as well as pertinent plan - they recommend:  Admission   Problem List / ED Course / Critical interventions / Medication management  Patient is doing well at this time and does remain stable.  Blood pressure has greatly improved with IV fluids in the emergency department.  I am concerned the patient has pneumonia at this point.  Will plan for admission to the hospital service given her altered mental status, pneumonia, apparent sepsis and acute kidney injury.  Urinalysis was unremarkable.  Patient is already aware of the hydronephrosis and possible ureteral stricture.  She is already due to see urology for this.  She did have some thickening of the ascending colon but she has had no vomiting or diarrhea.  Have discussed patient case with Dr. FORBES Carwin with the hospitalist service who has excepted for admission. I ordered medication including vancomycin , cefepime , Flagyl , IV fluids for sepsis, pneumonia Reevaluation of the patient after these medicines showed that the patient improved I have reviewed the patients home medicines and have made adjustments as needed   Social Determinants of Health:  None   Test / Admission - Considered:  Admission     Final diagnoses:  None    ED Discharge Orders     None          Daralene Lonni JONETTA DEVONNA 09/29/24 1837    Melvenia Motto, MD 10/05/24 [phone]

## 2024-09-29 NOTE — Plan of Care (Signed)
  Problem: Clinical Measurements: Goal: Will remain free from infection Outcome: Not Progressing   Problem: Activity: Goal: Risk for activity intolerance will decrease Outcome: Not Progressing   Problem: Nutrition: Goal: Adequate nutrition will be maintained Outcome: Not Progressing   Problem: Coping: Goal: Level of anxiety will decrease Outcome: Not Progressing   Problem: Safety: Goal: Ability to remain free from injury will improve Outcome: Not Progressing   Problem: Fluid Volume: Goal: Ability to maintain a balanced intake and output will improve Outcome: Not Progressing

## 2024-09-29 NOTE — H&P (Addendum)
 History and Physical    Gabrielle Wood FMW:[id number] DOB: [birth year] DOA: 09/29/2024  PCP: [name], MD   Patient coming from: Home  I have personally briefly reviewed patient's old medical records in [facility]  Chief Complaint: Cough, abdominal pain  HPI: Gabrielle Wood is a 78 y.o. female with medical history significant for hypertension, diabetes mellitus, hypothyroidism. Patient was brought to the ED with several complaints-headaches, generalized weakness, confused speech, lower abdominal pain, dry cough, left chest pain.   At the time of my evaluation patient is awake, alert and oriented and able to answer questions, patient's 2 sons and patient's friend at bedside. Reports that yesterday, patient's speech was confused, her speech was not slurred-per initial ED notes.  No vomiting no diarrhea.  She has had good oral intake.  No difficulty breathing.  No dizziness.  No focal heaviness or weakness of her extremities.  She reports weakness all over.  Patient lives alone, but has family- son, that lives nearby.  Recently hospitalized 9/12 to 9/17 for sepsis secondary to pneumonia, with AKI and failure to thrive.  ED Course: Temperature 97.5.  Heart rate 78-90.  Respirate rate 17-36.  Blood pressure systolic 82-169.  O2 sats 90 to 94% on room air. WBC 19.7.  Lactic acid 1.7.  Troponin 19.  COVID influenza RSV negative. Creatinine elevated 1.24. CT chest finding suggestive of infection of potential aspiration, also moderate right-sided hydronephrosis to the level of UPJ slightly progressed compared to prior CT.  Mild ascending colonic wall thickening-correlate for colitis type symptoms. IV cefepime  metronidazole  and vancomycin  started.  2 L bolus given.  Review of Systems: As per HPI all other systems reviewed and negative.  Past Medical History:  Diagnosis Date   Arthritis    COPD (chronic obstructive pulmonary disease) (HCC)    Diabetes mellitus    DVT (deep venous  thrombosis) (HCC)    years ago   GERD (gastroesophageal reflux disease)    H. pylori infection 2013   S/p treatment with amoxicillin , Biaxin, PPI   High cholesterol    Hypertension    Hypothyroidism    NSTEMI (non-ST elevated myocardial infarction) (HCC) 12/2020   s/p  PCI DES to the LCx   Thyroid  disease     Past Surgical History:  Procedure Laterality Date   BACK SURGERY     lumbar x3    ESOPHAGOGASTRODUODENOSCOPY  04/04/2012   Moderate gastritis/MODERATE Duodenitis in the duodenal bulb duodenum.  Pathology with chronic H. pylori gastritis.   EUS  05/14/2012   Normal pancreas, gallbladder,biliary tree/ Incidental finding of simple-appearing right renal cyst.   TONSILLECTOMY     TUBAL LIGATION     VEIN SURGERY     stripping years ago but recent laser surgery; bilateral legs     reports that she has quit smoking. Her smoking use included cigarettes. She has never used smokeless tobacco. She reports that she does not drink alcohol  and does not use drugs.  Allergies  Allergen Reactions   Amlodipine     Ciprocin-Fluocin-Procin [Fluocinolone]     Nightmares   Lipitor [Atorvastatin ] Other (See Comments)    Muscle aches, pain   Nebivolol    Nitrofuran Derivatives    Lisinopril Cough   Red Yeast Rice [Cholestin] Itching and Rash    Family History  Problem Relation Age of Onset   Pancreatic cancer Mother        diagnosed at age 78s, died from heart attack   Inflammatory bowel disease Brother  crohn's, his dgt deceased from colon cancer at age 56   Colon cancer Neg Hx     Prior to Admission medications   Medication Sig Start Date End Date Taking? Authorizing Provider  albuterol  (PROVENTIL ) (2.5 MG/3ML) 0.083% nebulizer solution Take 3 mLs (2.5 mg total) by nebulization every 6 (six) hours as needed for wheezing or shortness of breath. 09/30/23   Ula Prentice SAUNDERS, MD  albuterol  (VENTOLIN  HFA) 108 (90 Base) MCG/ACT inhaler Inhale 1-2 puffs into the lungs every 4 (four)  hours as needed for wheezing or shortness of breath. 09/30/23   Ula Prentice SAUNDERS, MD  ALPRAZolam  (XANAX ) 1 MG tablet Take 0.5 tablets (0.5 mg total) by mouth at bedtime. 0.5 tablet in am and 0.5 tab at night 08/19/24   Cheryle Page, MD  aspirin  EC 81 MG tablet Take 81 mg by mouth daily. Swallow whole.    [provider]  atorvastatin  (LIPITOR) 80 MG tablet Take 80 mg by mouth every evening. 11/24/21   [provider]  Continuous Glucose Sensor (FREESTYLE LIBRE 3 PLUS SENSOR) MISC Change sensor every 15 days. 09/16/24   Therisa Benton PARAS, NP  Fluticasone-Umeclidin-Vilant (TRELEGY ELLIPTA) 100-62.5-25 MCG/ACT AEPB Inhale 1 puff into the lungs daily.    [provider]  gabapentin  (NEURONTIN ) 100 MG capsule Take 1 capsule (100 mg total) by mouth 3 (three) times daily. 08/19/24   Cheryle Page, MD  guaiFENesin (MUCINEX) 600 MG 12 hr tablet Take 600 mg by mouth 2 (two) times daily.    [provider]  insulin  degludec (TRESIBA  FLEXTOUCH) 100 UNIT/ML FlexTouch Pen Inject 26 Units into the skin at bedtime. 09/16/24   Therisa Benton PARAS, NP  insulin  lispro (HUMALOG  KWIKPEN) 100 UNIT/ML KwikPen Inject 8-14 Units into the skin 3 (three) times daily. 09/16/24   Therisa Benton PARAS, NP  levothyroxine  (SYNTHROID ) 125 MCG tablet Take 1 tablet (125 mcg total) by mouth daily. 09/16/24   Therisa Benton PARAS, NP  magnesium  oxide (MAG-OX) 400 (240 Mg) MG tablet Take 400 mg by mouth daily.    [provider]  metoprolol  succinate (TOPROL -XL) 25 MG 24 hr tablet Take 25 mg by mouth daily.    [provider]  mirabegron ER (MYRBETRIQ) 25 MG TB24 tablet Take 25 mg by mouth daily.    [provider]  nitrofurantoin (MACRODANTIN) 50 MG capsule Take 50 mg by mouth at bedtime. Patient not taking: Reported on 09/16/2024    [provider]  nitroGLYCERIN (NITROSTAT) 0.4 MG SL tablet Place 0.4 mg under the tongue every 5 (five) minutes as needed for chest pain.     [provider]  pantoprazole  (PROTONIX ) 40 MG tablet Take 40 mg by mouth daily. 06/06/21   [provider]  polyethylene glycol powder (GLYCOLAX /MIRALAX ) 17 GM/SCOOP powder Take 17 g by mouth daily as needed for mild constipation or moderate constipation. Dissolve 1 capful (17g) in 4-8 ounces of liquid and take by mouth daily.    [provider]  ticagrelor  (BRILINTA ) 90 MG TABS tablet Take 90 mg by mouth 2 (two) times daily.    [provider]  valsartan (DIOVAN) 80 MG tablet Take 40 mg by mouth daily.    [provider]  fexofenadine (ALLEGRA) 60 MG tablet Take by mouth.  04/13/21  [provider]    Physical Exam: Vitals:   09/29/24 1400 09/29/24 1430 09/29/24 1600 09/29/24 1819  BP: 128/84 134/81 (!) 119/56   Pulse: 82 80 86   Resp: (!) 27 (!)  29 (!) 21   Temp:    97.7 F (36.5 C)  TempSrc:    Oral  SpO2: (!) 89% 93% 96%   Weight:      Height:        Constitutional: NAD, calm, comfortable Vitals:   09/29/24 1400 09/29/24 1430 09/29/24 1600 09/29/24 1819  BP: 128/84 134/81 (!) 119/56   Pulse: 82 80 86   Resp: (!) 27 (!) 29 (!) 21   Temp:    97.7 F (36.5 C)  TempSrc:    Oral  SpO2: (!) 89% 93% 96%   Weight:      Height:       Eyes: PERRL, lids and conjunctivae normal ENMT: Mucous membranes are moist.  Neck: normal, supple, no masses, no thyromegaly Respiratory: clear to auscultation bilaterally, no wheezing, no crackles. Normal respiratory effort. No accessory muscle use.  Cardiovascular: Regular rate and rhythm, no murmurs / rubs / gallops.   Extremities warm Abdomen: no tenderness, no masses palpated. No hepatosplenomegaly. Bowel sounds positive.  Musculoskeletal: no clubbing / cyanosis. No joint deformity upper and lower extremities.  Skin: no rashes, lesions, ulcers. No induration Neurologic: No facial asymmetry, moves extremities spontaneously, speech fluent.SABRA  Psychiatric: Normal judgment and insight. Alert and  oriented x 3. Normal mood.   Labs on Admission: I have personally reviewed following labs and imaging studies  CBC: Recent Labs  Lab 09/29/24 1249  WBC 19.7*  HGB 11.2*  HCT 34.2*  MCV 85.7  PLT 182   Basic Metabolic Panel: Recent Labs  Lab 09/29/24 1249  NA 136  K 3.8  CL 101  CO2 23  GLUCOSE 270*  BUN 29*  CREATININE 1.24*  CALCIUM  9.7   GFR: Estimated Creatinine Clearance: 35.8 mL/min (A) (by C-G formula based on SCr of 1.24 mg/dL (H)). Liver Function Tests: Recent Labs  Lab 09/29/24 1249  AST 17  ALT 13  ALKPHOS 56  BILITOT 0.6  PROT 7.4  ALBUMIN 3.8   CBG: Recent Labs  Lab 09/29/24 1229  GLUCAP 229*   Urine analysis:    Component Value Date/Time   COLORURINE YELLOW 09/29/2024 1716   APPEARANCEUR CLEAR 09/29/2024 1716   APPEARANCEUR Clear 10/02/2021 1400   LABSPEC 1.014 09/29/2024 1716   PHURINE 6.0 09/29/2024 1716   GLUCOSEU >=500 (A) 09/29/2024 1716   HGBUR SMALL (A) 09/29/2024 1716   BILIRUBINUR NEGATIVE 09/29/2024 1716   BILIRUBINUR Negative 10/02/2021 1400   KETONESUR NEGATIVE 09/29/2024 1716   PROTEINUR NEGATIVE 09/29/2024 1716   UROBILINOGEN 0.2 04/03/2012 2144   NITRITE NEGATIVE 09/29/2024 1716   LEUKOCYTESUR NEGATIVE 09/29/2024 1716    Radiological Exams on Admission: CT CHEST ABDOMEN PELVIS W CONTRAST Result Date: 09/29/2024 CLINICAL DATA:  Left-sided chest pain right lower quadrant abdominal pain EXAM: CT CHEST, ABDOMEN, AND PELVIS WITH CONTRAST TECHNIQUE: Multidetector CT imaging of the chest, abdomen and pelvis was performed following the standard protocol during bolus administration of intravenous contrast. RADIATION DOSE REDUCTION: This exam was performed according to the departmental dose-optimization program which includes automated exposure control, adjustment of the mA and/or kV according to patient size and/or use of iterative reconstruction technique. CONTRAST:  80mL OMNIPAQUE  IOHEXOL  300 MG/ML  SOLN COMPARISON:  CT  08/15/2024, 01/06/2024, CT 11/04/2022, 07/05/2022, 10/14/2021 FINDINGS: CT CHEST FINDINGS Cardiovascular: Moderate aortic atherosclerosis. No aneurysm. Multi vessel coronary vascular calcification. Normal cardiac size. No pericardial effusion Mediastinum/Nodes: Patent trachea. No thyroid  mass. Borderline mediastinal lymph nodes as before, AP window nodes up to 11 mm. Small hiatal hernia Lungs/Pleura:  Mild diffuse septal thickening. Apical scarring. Bronchial wall thickening and persistent subpleural left greater than right consolidation as compared with CT from last month. Atelectasis or small focus of consolidation at the lingula, slightly progressive. Trace pleural effusions. Musculoskeletal: Sternum appears intact. No acute or suspicious osseous abnormality. CT ABDOMEN PELVIS FINDINGS Hepatobiliary: No focal liver abnormality is seen. No gallstones, gallbladder wall thickening, or biliary dilatation. Pancreas: Unremarkable. No pancreatic ductal dilatation or surrounding inflammatory changes. Spleen: Normal in size without focal abnormality. Adrenals/Urinary Tract: Adrenal glands are normal. Renal cysts for which no imaging follow-up is recommended. Moderate right-sided hydronephrosis to the level of the UPJ stable to slightly progressive compared with most recent prior CT. Possible mild urothelial thickening of proximal right ureter, series 2, image 91. Distended urinary bladder. Right ureter decompressed. Stomach/Bowel: Stomach is nondistended. No dilated small bowel. Negative appendix. Question mild wall thickening of ascending colon with slight mucosal enhancement, series 2, image 91 through 99. Not much surrounding stranding. Vascular/Lymphatic: Aortic atherosclerosis. No enlarged abdominal or pelvic lymph nodes. Reproductive: Uterus and bilateral adnexa are unremarkable. Calcified uterine fibroid Other: Negative for pelvic effusion or free air Musculoskeletal: No acute or suspicious osseous abnormality.  IMPRESSION: 1. Bronchial wall thickening and persistent subpleural left greater than right consolidation and trace pleural effusions as compared with CT from last month. Atelectasis or small focus of consolidation at the lingula, slightly progressive. Findings could be secondary to infection or potential aspiration. 2. Moderate right-sided hydronephrosis to the level of the UPJ, stable to slightly progressive compared with most recent prior CT. Possible mild urothelial thickening of proximal right ureter, question mild inflammatory or infectious process. UPJ obstruction/proximal ureteral stricture also considered. Recommend urology consultation if not already performed. 3. Possible mild wall thickening of ascending colon with slight mucosal enhancement but no significant surrounding edema or inflammation. Correlate for colitis type symptoms. 4. Aortic atherosclerosis. Aortic Atherosclerosis (ICD10-I70.0). Electronically Signed   By: Luke Bun M.D.   On: 09/29/2024 15:33   CT Head Wo Contrast Result Date: 09/29/2024 CLINICAL DATA:  Headache EXAM: CT HEAD WITHOUT CONTRAST TECHNIQUE: Contiguous axial images were obtained from the base of the skull through the vertex without intravenous contrast. RADIATION DOSE REDUCTION: This exam was performed according to the departmental dose-optimization program which includes automated exposure control, adjustment of the mA and/or kV according to patient size and/or use of iterative reconstruction technique. COMPARISON:  CT brain 12/08/2021, 12/17/2023 FINDINGS: Brain: No acute territorial infarction, hemorrhage or intracranial mass.atrophy and mild chronic small vessel ischemic changes of the white matter. 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 Stable ventricle size. Vascular: No hyperdense vessels.  Carotid vascular calcification Skull: Normal. Negative for fracture or focal lesion. Sinuses/Orbits: No acute finding. Other: None IMPRESSION: 1. No CT evidence for  acute intracranial abnormality. 2. Atrophy and chronic small vessel ischemic changes of the white matter. 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 Electronically Signed   By: Luke Bun M.D.   On: 09/29/2024 15:16   DG Chest Port 1 View Result Date: 09/29/2024 CLINICAL DATA:  Weakness EXAM: PORTABLE CHEST 1 VIEW COMPARISON:  Chest radiograph dated 08/14/2024 FINDINGS: Normal lung volumes. Patchy left basilar opacity. Unchanged blunting of the left costophrenic angle. No pneumothorax. The heart size and mediastinal contours are within normal limits. No acute osseous abnormality. IMPRESSION: 1. Patchy left basilar opacity, likely atelectasis. 2. Unchanged blunting of the left costophrenic angle, which may represent a small pleural effusion. Electronically Signed   By: Limin  Xu M.D.  On: 09/29/2024 13:13   EKG: Independently reviewed.  Sinus rhythm, rate 81, QTc 447.  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  Assessment/Plan Principal Problem:   CAP (community acquired pneumonia) Active Problems:   AKI (acute kidney injury)   Benign hypertension   Hypothyroidism   Rheumatoid arthritis (HCC)   Diabetes mellitus (HCC)   Assessment and Plan:  Pneumonia-presenting with cough, WBC 19.7, tachypnea respirate rate 17-36.  Admitted just over 5 weeks ago also with pneumonia.  CT chest-bronchial wall thickening, persistent subpleural left greater than right consolidation and trace pleural effusions, small focus of consolidation in lingula slightly progressive,-infection of potential aspiration. -Continue IV ceftriaxone  and azithromycin  - Swallow evaluation  Acute metabolic encephalopathy-confused speech, no slurred speech or focal neurologic deficits.  Mental status has improved at this time.  Likely secondary to pneumonia, sepsis.  Head CT negative for acute abnormality, chronic lacunar infarct, atrophy. - IV fluids, IV antibiotics  Severe sepsis-meeting criteria with leukocytosis of 19.7, tachypnea respiratory 17-36.   With evidence of endorgan dysfunction-AKI.  Secondary to pneumonia.  UA not suggestive of UTI.  But CT abdomen possible mild urothelial thickening of proximal right ureter, question mild inflammatory or infectious process.  Also suggest colitis involving the ascending colon.  Patient reports abdominal pain, denies diarrhea. - Continue IV ceftriaxone  and azithromycin , will add metronidazole  - 2 l bolus given continue N/s 75 cc/hr x 20 hrs - Follow-up blood cultures -add on urine cultures  Right hydronephrosis-patient and family aware, and were to follow-up with urology as outpatient.  CT today - moderate right-sided hydronephrosis to the level of the UPJ, stable to slightly progressive compared with most recent prior CT. Possible mild urothelial thickening of proximal right ureter, question mild inflammatory or infectious process. UPJ obstruction/proximal ureteral stricture also considered. - Urology consult in a.m.  AKI-creatinine 1.24, baseline 0.7-0.8. - Hydrate  Hypertension-stable.  -Pending med reconciliation resume metoprolol , valsartan  Uncontrolled diabetes mellitus with hyperglycemia-glucose 425. A1c 9.7. - SSI- M - Resume long-acting insulin  26 units daily  Coronary artery disease- s/p PCI with stent - Resume Brilinta , aspirin , atorvastatin   DVT prophylaxis: Lovenox  Code Status: Full code-confirmed patient's, friend and 2 sons at bedside Family Communication: Patient's 2 sons and patient's friend at bedside. Disposition Plan: > 2 days Consults called: urology Admission status: inpt tele I certify that at the point of admission it is my clinical judgment that the patient will require inpatient hospital care spanning beyond 2 midnights from the point of admission due to high intensity of service, high risk for further deterioration and high frequency of surveillance required.   Author: Tully FORBES Carwin, MD 09/29/2024 9:29 PM  For on call review [email or web address].

## 2024-09-30 DIAGNOSIS — N131 Hydronephrosis with ureteral stricture, not elsewhere classified: Secondary | ICD-10-CM | POA: Diagnosis not present

## 2024-09-30 DIAGNOSIS — J189 Pneumonia, unspecified organism: Secondary | ICD-10-CM | POA: Diagnosis not present

## 2024-09-30 LAB — GLUCOSE, CAPILLARY
Glucose-Capillary: 425 mg/dL — ABNORMAL HIGH (ref 70–99)
Glucose-Capillary: 201 mg/dL — ABNORMAL HIGH (ref 70–99)
Glucose-Capillary: 119 mg/dL — ABNORMAL HIGH (ref 70–99)
Glucose-Capillary: 374 mg/dL — ABNORMAL HIGH (ref 70–99)
Glucose-Capillary: 310 mg/dL — ABNORMAL HIGH (ref 70–99)
Glucose-Capillary: 258 mg/dL — ABNORMAL HIGH (ref 70–99)

## 2024-09-30 LAB — CBC
HCT: 31.2 % — ABNORMAL LOW (ref 36.0–46.0)
Hemoglobin: 10.3 g/dL — ABNORMAL LOW (ref 12.0–15.0)
MCH: 27.9 pg (ref 26.0–34.0)
MCHC: 33 g/dL (ref 30.0–36.0)
MCV: 84.6 fL (ref 80.0–100.0)
Platelets: 158 K/uL (ref 150–400)
RBC: 3.69 MIL/uL — ABNORMAL LOW (ref 3.87–5.11)
RDW: 15.1 % (ref 11.5–15.5)
WBC: 15.3 K/uL — ABNORMAL HIGH (ref 4.0–10.5)
nRBC: 0 % (ref 0.0–0.2)

## 2024-09-30 LAB — BASIC METABOLIC PANEL WITH GFR
Anion gap: 12 (ref 5–15)
BUN: 22 mg/dL (ref 8–23)
CO2: 23 mmol/L (ref 22–32)
Calcium: 9.4 mg/dL (ref 8.9–10.3)
Chloride: 103 mmol/L (ref 98–111)
Creatinine, Ser: 0.88 mg/dL (ref 0.44–1.00)
GFR, Estimated: >60 mL/min (ref >60)
Glucose, Bld: 186 mg/dL — ABNORMAL HIGH (ref 70–99)
Potassium: 3.2 mmol/L — ABNORMAL LOW (ref 3.5–5.1)
Sodium: 137 mmol/L (ref 135–145)

## 2024-09-30 MED ORDER — PANTOPRAZOLE SODIUM 40 MG PO TBEC
40.0000 mg | DELAYED_RELEASE_TABLET | Freq: Every day | ORAL | Status: DC
  Administered 2024-10-01 – 2024-10-02 (×2): 40 mg via ORAL
  Filled 2024-09-30 (×2): qty 1

## 2024-09-30 MED ORDER — METOPROLOL SUCCINATE ER 25 MG PO TB24: 25.0000 mg | ORAL_TABLET | Freq: Every day | ORAL | Status: DC

## 2024-09-30 MED ORDER — GABAPENTIN 100 MG PO CAPS
100.0000 mg | ORAL_CAPSULE | Freq: Two times a day (BID) | ORAL | Status: DC
  Administered 2024-09-30 – 2024-10-02 (×4): 100 mg via ORAL
  Filled 2024-09-30 (×4): qty 1

## 2024-09-30 MED ORDER — ALBUTEROL SULFATE (2.5 MG/3ML) 0.083% IN NEBU
2.5000 mg | INHALATION_SOLUTION | Freq: Four times a day (QID) | RESPIRATORY_TRACT | Status: DC | PRN
  Administered 2024-10-02: 2.5 mg via RESPIRATORY_TRACT
  Filled 2024-09-30: qty 3

## 2024-09-30 MED ORDER — MAGNESIUM OXIDE -MG SUPPLEMENT 400 (240 MG) MG PO TABS
400.0000 mg | ORAL_TABLET | Freq: Every day | ORAL | Status: DC
  Administered 2024-10-01 – 2024-10-02 (×2): 400 mg via ORAL
  Filled 2024-09-30 (×2): qty 1

## 2024-09-30 MED ORDER — MIRABEGRON ER 25 MG PO TB24
25.0000 mg | ORAL_TABLET | Freq: Every day | ORAL | Status: DC
  Administered 2024-09-30 – 2024-10-02 (×3): 25 mg via ORAL
  Filled 2024-09-30 (×3): qty 1

## 2024-09-30 NOTE — TOC Initial Note (Signed)
 Transition of Care [facility]) - Initial/Assessment Note    Patient Details  Name: Gabrielle Wood MRN: [id number] Date of Birth: 05-21-1946  Transition of Care [facility]) CM/SW Contact:    [name], LCSW Phone Number: 09/30/2024, 10:04 AM  Clinical Narrative:                 Pt lives at home with son. Pt has friend that stays with pt during the day while son is at work. Pt has a hospital bed and RW at home. Pt is active with [facility] for HHPT (ROC orders will need to be placed prior to discharge). Pt receives outpatient palliative care services with [facility]. CSW will continue to follow for discharge planning needs.   Expected Discharge Plan: Home w Home Health Services Barriers to Discharge: Continued Medical Work up   Patient Goals and CMS Choice Patient states their goals for this hospitalization and ongoing recovery are:: For pt to return with home health CMS Medicare.gov Compare Post Acute Care list provided to:: Patient Represenative (must comment) Choice offered to / list presented to : Adult Children      Expected Discharge Plan and Services In-house Referral: Clinical Social Work Discharge Planning Services: NA Post Acute Care Choice: Home Health, Resumption of Svcs/PTA Provider Living arrangements for the past 2 months: Single Family Home                 DME Arranged: N/A DME Agency: NA                  Prior Living Arrangements/Services Living arrangements for the past 2 months: Single Family Home Lives with:: Adult Children Patient language and need for interpreter reviewed:: Yes Do you feel safe going back to the place where you live?: Yes      Need for Family Participation in Patient Care: Yes (Comment) Care giver support system in place?: Yes (comment) Current home services: DME, Home PT (RW, hospital bed. HH w/ [email or web address]) Criminal Activity/Legal Involvement Pertinent to Current Situation/Hospitalization: No - Comment as needed  Activities of Daily  Living   ADL Screening (condition at time of admission) Independently performs ADLs?: No Does the patient have a NEW difficulty with bathing/dressing/toileting/self-feeding that is expected to last >3 days?: Yes (Initiates electronic notice to provider for possible OT consult) Does the patient have a NEW difficulty with getting in/out of bed, walking, or climbing stairs that is expected to last >3 days?: Yes (Initiates electronic notice to provider for possible PT consult) Does the patient have a NEW difficulty with communication that is expected to last >3 days?: No Is the patient deaf or have difficulty hearing?: Yes Does the patient have difficulty seeing, even when wearing glasses/contacts?: No Does the patient have difficulty concentrating, remembering, or making decisions?: No  Permission Sought/Granted Permission sought to share information with : Facility Medical Sales Representative, Family Supports Permission granted to share information with : Yes, Verbal Permission Granted  Share Information with NAME: Devyn, Griffing (Son)  [phone]  Permission granted to share info w AGENCY: [facility]        Emotional Assessment Appearance:: Appears stated age Attitude/Demeanor/Rapport: Engaged Affect (typically observed): Accepting Orientation: : Oriented to Self, Oriented to Place, Oriented to  Time, Oriented to Situation Alcohol  / Substance Use: Not Applicable Psych Involvement: No (comment)  Admission diagnosis:  Weakness [R53.1] AKI (acute kidney injury) [N17.9] Acute kidney injury [N17.9] Pneumonia due to infectious organism, unspecified laterality, unspecified part of lung [J18.9] Sepsis, due to unspecified organism, unspecified whether  acute organ dysfunction present [facility]) [A41.9] Patient Active Problem List   Diagnosis Date Noted   AKI (acute kidney injury) 09/29/2024   Sepsis (HCC) 08/15/2024   Anxiety 05/28/2023   Vitamin D deficiency 01/31/2022   Pressure injury of skin  10/16/2021   DKA (diabetic ketoacidosis) (HCC) 10/14/2021   Hypomagnesemia 10/14/2021   Hypoalbuminemia 10/14/2021   CAP (community acquired pneumonia) 10/14/2021   Renal cyst 10/02/2021   History of colonic polyps 08/27/2018   Taking multiple medications for chronic disease 08/27/2018   Type 2 diabetes mellitus with hypoglycemia without coma, with long-term current use of insulin  (HCC) 09/14/2015   Tobacco abuse counseling 09/14/2015   Rheumatoid arthritis (HCC) 06/08/2015   Diabetes mellitus (HCC) 06/08/2015   Gastritis and duodenitis 04/05/2012   Pancreatitis, acute 04/03/2012   Mixed hyperlipidemia 04/03/2012   Benign hypertension 04/03/2012   Hypothyroidism 04/03/2012   GERD (gastroesophageal reflux disease) 04/03/2012   PCP:  [name], MD Pharmacy:   [facility], [place] - [address]  [place] [address] Phone: [phone] Fax: [phone]     Social Drivers of Health (SDOH) Social History: SDOH Screenings   Food Insecurity: No Food Insecurity (09/29/2024)  Housing: Low Risk  (09/29/2024)  Transportation Needs: No Transportation Needs (09/29/2024)  Utilities: Not At Risk (09/29/2024)  Depression (PHQ2-9): High Risk (11/01/2021)  Financial Resource Strain: Low Risk  (08/11/2024)   Received from [facility]  Physical Activity: Inactive (08/11/2024)   [facility]  Social Connections: Socially Isolated (09/29/2024)  Stress: No Stress Concern Present (08/11/2024)   Received from [facility]  Tobacco Use: Medium Risk (09/29/2024)  Health Literacy: Medium Risk (08/11/2024)   Received from [facility]   SDOH Interventions:     Readmission Risk Interventions    08/19/2024    9:21 AM 08/17/2024    2:30 PM  Readmission Risk Prevention Plan  Post Dischage Appt Complete   Medication Screening Complete Complete  Transportation Screening Complete Complete

## 2024-09-30 NOTE — Progress Notes (Addendum)
 PROGRESS NOTE    Gabrielle Wood  FMW:[id number] DOB: 30-Jun-1946 DOA: 09/29/2024 PCP: [name], MD   Brief Narrative:    Gabrielle Wood is a 78 y.o. female with medical history significant for hypertension, diabetes mellitus, hypothyroidism. Patient was brought to the ED with several complaints-headaches, generalized weakness, confused speech, lower abdominal pain, dry cough, left chest pain.    Reports that yesterday, patient's speech was confused, her speech was not slurred-per initial ED notes.  No vomiting no diarrhea.  She has had good oral intake.  No difficulty breathing.  No dizziness.  No focal heaviness or weakness of her extremities.  She reports weakness all over.  Patient lives alone, but has family- son, that lives nearby.   Recently hospitalized 9/12 to 9/17 for sepsis secondary to pneumonia, with AKI and failure to thrive.   ED Course: Temperature 97.5.  Heart rate 78-90.  Respirate rate 17-36.  Blood pressure systolic 82-169.  O2 sats 90 to 94% on room air. WBC 19.7.  Lactic acid 1.7.  Troponin 19.  COVID influenza RSV negative. Creatinine elevated 1.24. CT chest finding suggestive of infection of potential aspiration, also moderate right-sided hydronephrosis to the level of UPJ slightly progressed compared to prior CT.  Mild ascending colonic wall thickening-correlate for colitis type symptoms. IV cefepime  metronidazole  and vancomycin  started.  2 L bolus given.  Assessment & Plan:   Principal Problem:   CAP (community acquired pneumonia) Active Problems:   AKI (acute kidney injury)   Benign hypertension   Hypothyroidism   Rheumatoid arthritis (HCC)   Diabetes mellitus (HCC)   Pneumonia-presenting with cough, WBC 19.7, tachypnea respirate rate 17-36.  Admitted just over 5 weeks ago also with pneumonia.  CT chest-bronchial wall thickening, persistent subpleural left greater than right consolidation and trace pleural effusions, small focus of consolidation in  lingula slightly progressive,-infection of potential aspiration. -Continue IV ceftriaxone  and azithromycin  - Swallow evaluation   Acute metabolic encephalopathy-confused speech, no slurred speech or focal neurologic deficits.  Mental status has improved at this time.  Likely secondary to pneumonia, sepsis.  Head CT negative for acute abnormality, chronic lacunar infarct, atrophy. - IV fluids, IV antibiotics-covering pneumonia, UTI, colitis   Severe sepsis-meeting criteria with leukocytosis of 19.7, tachypnea respiratory 17-36.  With evidence of endorgan dysfunction-AKI.  Secondary to pneumonia.  UA not suggestive of UTI.  But CT abdomen possible mild urothelial thickening of proximal right ureter, question mild inflammatory or infectious process.  Also suggest colitis involving the ascending colon.  Patient reports abdominal pain, denies diarrhea. - Follow-up blood culture, urine culture Urology consult.  Right hydronephrosis-present previously, now worsening.  CT today - moderate right-sided hydronephrosis to the level of the UPJ, stable to slightly progressive compared with most recent prior CT. Possible mild urothelial thickening of proximal right ureter, question mild inflammatory or infectious process. UPJ obstruction/proximal ureteral stricture also considered. - Urology consult is pending.  AKI-creatinine 1.24 has normalized to 0.88.  Hypertension-blood pressure has been borderline low.  Will continue to hold metoprolol , valsartan.   Uncontrolled diabetes mellitus with hyperglycemia-glucose 425. A1c 9.7. - SSI- M - Resume long-acting insulin  26 units daily   Coronary artery disease- s/p PCI with stent - Resume Brilinta , aspirin , atorvastatin    DVT prophylaxis: Lovenox  Code Status: Full code-  Family Communication: No family members at the bedside.   Disposition Plan: > 2 days Consults called: urology Admission status: inpt tele I certify that at the point of admission it is my  clinical  judgment that the patient will require inpatient hospital care spanning beyond 2 midnights from the point of admission due to high intensity of service, high risk for further deterioration and high frequency of surveillance required.    Subjective:  Patient seen and examined at the bedside.  No family members present.  She appears comfortable.  Denies any fever chills cough, shortness of breath.  Vital signs are stable.  Blood pressure is on the softer side today.  Objective: Vitals:   09/30/24 0005 09/30/24 0336 09/30/24 0514 09/30/24 0719  BP: 126/80 (!) 91/42 (!) 91/56   Pulse: 93 92 80   Resp: 18 18    Temp: 100 F (37.8 C) 97.9 F (36.6 C)    TempSrc: Oral Oral    SpO2: 97% 92%  94%  Weight:      Height:        Intake/Output Summary (Last 24 hours) at 09/30/2024 1403 Last data filed at 09/30/2024 0510 Gross per 24 hour  Intake 1476.16 ml  Output 2850 ml  Net -1373.84 ml   Filed Weights   09/29/24 1230  Weight: 66.7 kg    Examination:  General exam: Appears calm and comfortable  Respiratory system: Bilateral decreased breath sounds at bases Cardiovascular system: S1 & S2 heard, Rate controlled Gastrointestinal system: Abdomen is nondistended, soft and nontender. Normal bowel sounds heard. Extremities: No cyanosis, clubbing, edema  Central nervous system: Alert and oriented. No focal neurological deficits. Moving extremities Skin: No rashes, lesions or ulcers Psychiatry: Judgement and insight appear normal. Mood & affect appropriate.     Data Reviewed: I have personally reviewed following labs and imaging studies  CBC: Recent Labs  Lab 09/29/24 1249 09/30/24 0456  WBC 19.7* 15.3*  HGB 11.2* 10.3*  HCT 34.2* 31.2*  MCV 85.7 84.6  PLT 182 158   Basic Metabolic Panel: Recent Labs  Lab 09/29/24 1249 09/30/24 0456  NA 136 137  K 3.8 3.2*  CL 101 103  CO2 23 23  GLUCOSE 270* 186*  BUN 29* 22  CREATININE 1.24* 0.88  CALCIUM  9.7 9.4    GFR: Estimated Creatinine Clearance: 50.4 mL/min (by C-G formula based on SCr of 0.88 mg/dL). Liver Function Tests: Recent Labs  Lab 09/29/24 1249  AST 17  ALT 13  ALKPHOS 56  BILITOT 0.6  PROT 7.4  ALBUMIN 3.8   No results for input(s): LIPASE, AMYLASE in the last 168 hours. No results for input(s): AMMONIA in the last 168 hours. Coagulation Profile: No results for input(s): INR, PROTIME in the last 168 hours. Cardiac Enzymes: No results for input(s): CKTOTAL, CKMB, CKMBINDEX, TROPONINI in the last 168 hours. BNP (last 3 results) No results for input(s): PROBNP in the last 8760 hours. HbA1C: No results for input(s): HGBA1C in the last 72 hours. CBG: Recent Labs  Lab 09/29/24 2032 09/29/24 2229 09/30/24 0337 09/30/24 0714 09/30/24 1134  GLUCAP 425* 472* 201* 119* 374*   Lipid Profile: No results for input(s): CHOL, HDL, LDLCALC, TRIG, CHOLHDL, LDLDIRECT in the last 72 hours. Thyroid  Function Tests: No results for input(s): TSH, T4TOTAL, FREET4, T3FREE, THYROIDAB in the last 72 hours. Anemia Panel: No results for input(s): VITAMINB12, FOLATE, FERRITIN, TIBC, IRON, RETICCTPCT in the last 72 hours. Sepsis Labs: Recent Labs  Lab 09/29/24 1249  LATICACIDVEN 1.7    Recent Results (from the past 240 hours)  Resp panel by RT-PCR (RSV, Flu A&B, Covid) Anterior Nasal Swab     Status: None   Collection Time: 09/29/24 12:49 PM  Specimen: Anterior Nasal Swab  Result Value Ref Range Status   SARS Coronavirus 2 by RT PCR NEGATIVE NEGATIVE Final    Comment: (NOTE) SARS-CoV-2 target nucleic acids are NOT DETECTED.  The SARS-CoV-2 RNA is generally detectable in upper respiratory specimens during the acute phase of infection. The lowest concentration of SARS-CoV-2 viral copies this assay can detect is 138 copies/mL. A negative result does not preclude SARS-Cov-2 infection and should not be used as the sole basis for  treatment or other patient management decisions. A negative result may occur with  improper specimen collection/handling, submission of specimen other than nasopharyngeal swab, presence of viral mutation(s) within the areas targeted by this assay, and inadequate number of viral copies(<138 copies/mL). A negative result must be combined with clinical observations, patient history, and epidemiological information. The expected result is Negative.  Fact Sheet for Patients:  bloggercourse.com  Fact Sheet for Healthcare Providers:  seriousbroker.it  This test is no t yet approved or cleared by the [place]  FDA and  has been authorized for detection and/or diagnosis of SARS-CoV-2 by FDA under an Emergency Use Authorization (EUA). This EUA will remain  in effect (meaning this test can be used) for the duration of the COVID-19 declaration under Section 564(b)(1) of the Act, 21 U.S.C.section 360bbb-3(b)(1), unless the authorization is terminated  or revoked sooner.       Influenza A by PCR NEGATIVE NEGATIVE Final   Influenza B by PCR NEGATIVE NEGATIVE Final    Comment: (NOTE) The Xpert Xpress SARS-CoV-2/FLU/RSV plus assay is intended as an aid in the diagnosis of influenza from Nasopharyngeal swab specimens and should not be used as a sole basis for treatment. Nasal washings and aspirates are unacceptable for Xpert Xpress SARS-CoV-2/FLU/RSV testing.  Fact Sheet for Patients: bloggercourse.com  Fact Sheet for Healthcare Providers: seriousbroker.it  This test is not yet approved or cleared by the [place]  FDA and has been authorized for detection and/or diagnosis of SARS-CoV-2 by FDA under an Emergency Use Authorization (EUA). This EUA will remain in effect (meaning this test can be used) for the duration of the COVID-19 declaration under Section 564(b)(1) of the Act, 21  U.S.C. section 360bbb-3(b)(1), unless the authorization is terminated or revoked.     Resp Syncytial Virus by PCR NEGATIVE NEGATIVE Final    Comment: (NOTE) Fact Sheet for Patients: bloggercourse.com  Fact Sheet for Healthcare Providers: seriousbroker.it  This test is not yet approved or cleared by the [place]  FDA and has been authorized for detection and/or diagnosis of SARS-CoV-2 by FDA under an Emergency Use Authorization (EUA). This EUA will remain in effect (meaning this test can be used) for the duration of the COVID-19 declaration under Section 564(b)(1) of the Act, 21 U.S.C. section 360bbb-3(b)(1), unless the authorization is terminated or revoked.  Performed at [facility], [address] [place], [place] [address]   Culture, blood (routine x 2)     Status: None (Preliminary result)   Collection Time: 09/29/24 12:50 PM   Specimen: BLOOD  Result Value Ref Range Status   Specimen Description BLOOD BLOOD LEFT FOREARM  Final   Special Requests   Final    BOTTLES DRAWN AEROBIC AND ANAEROBIC Blood Culture adequate volume   Culture   Final    NO GROWTH < 24 HOURS Performed at [facility], [address], [place], [place] [address]    Report Status PENDING  Incomplete  Culture, blood (routine x 2)     Status: None (Preliminary result)  Collection Time: 09/29/24 12:55 PM   Specimen: BLOOD  Result Value Ref Range Status   Specimen Description BLOOD RIGHT ANTECUBITAL  Final   Special Requests   Final    BOTTLES DRAWN AEROBIC AND ANAEROBIC Blood Culture adequate volume   Culture   Final    NO GROWTH < 24 HOURS Performed at [facility], [address], [place], [place] [address]    Report Status PENDING  Incomplete         Radiology Studies: CT CHEST ABDOMEN PELVIS W CONTRAST Result Date: 09/29/2024 CLINICAL DATA:  Left-sided chest pain right lower quadrant abdominal pain EXAM: CT CHEST, ABDOMEN, AND PELVIS  WITH CONTRAST TECHNIQUE: Multidetector CT imaging of the chest, abdomen and pelvis was performed following the standard protocol during bolus administration of intravenous contrast. RADIATION DOSE REDUCTION: This exam was performed according to the departmental dose-optimization program which includes automated exposure control, adjustment of the mA and/or kV according to patient size and/or use of iterative reconstruction technique. CONTRAST:  80mL OMNIPAQUE  IOHEXOL  300 MG/ML  SOLN COMPARISON:  CT 08/15/2024, 01/06/2024, CT 11/04/2022, 07/05/2022, 10/14/2021 FINDINGS: CT CHEST FINDINGS Cardiovascular: Moderate aortic atherosclerosis. No aneurysm. Multi vessel coronary vascular calcification. Normal cardiac size. No pericardial effusion Mediastinum/Nodes: Patent trachea. No thyroid  mass. Borderline mediastinal lymph nodes as before, AP window nodes up to 11 mm. Small hiatal hernia Lungs/Pleura: Mild diffuse septal thickening. Apical scarring. Bronchial wall thickening and persistent subpleural left greater than right consolidation as compared with CT from last month. Atelectasis or small focus of consolidation at the lingula, slightly progressive. Trace pleural effusions. Musculoskeletal: Sternum appears intact. No acute or suspicious osseous abnormality. CT ABDOMEN PELVIS FINDINGS Hepatobiliary: No focal liver abnormality is seen. No gallstones, gallbladder wall thickening, or biliary dilatation. Pancreas: Unremarkable. No pancreatic ductal dilatation or surrounding inflammatory changes. Spleen: Normal in size without focal abnormality. Adrenals/Urinary Tract: Adrenal glands are normal. Renal cysts for which no imaging follow-up is recommended. Moderate right-sided hydronephrosis to the level of the UPJ stable to slightly progressive compared with most recent prior CT. Possible mild urothelial thickening of proximal right ureter, series 2, image 91. Distended urinary bladder. Right ureter decompressed.  Stomach/Bowel: Stomach is nondistended. No dilated small bowel. Negative appendix. Question mild wall thickening of ascending colon with slight mucosal enhancement, series 2, image 91 through 99. Not much surrounding stranding. Vascular/Lymphatic: Aortic atherosclerosis. No enlarged abdominal or pelvic lymph nodes. Reproductive: Uterus and bilateral adnexa are unremarkable. Calcified uterine fibroid Other: Negative for pelvic effusion or free air Musculoskeletal: No acute or suspicious osseous abnormality. IMPRESSION: 1. Bronchial wall thickening and persistent subpleural left greater than right consolidation and trace pleural effusions as compared with CT from last month. Atelectasis or small focus of consolidation at the lingula, slightly progressive. Findings could be secondary to infection or potential aspiration. 2. Moderate right-sided hydronephrosis to the level of the UPJ, stable to slightly progressive compared with most recent prior CT. Possible mild urothelial thickening of proximal right ureter, question mild inflammatory or infectious process. UPJ obstruction/proximal ureteral stricture also considered. Recommend urology consultation if not already performed. 3. Possible mild wall thickening of ascending colon with slight mucosal enhancement but no significant surrounding edema or inflammation. Correlate for colitis type symptoms. 4. Aortic atherosclerosis. Aortic Atherosclerosis (ICD10-I70.0). Electronically Signed   By: Luke Bun M.D.   On: 09/29/2024 15:33   CT Head Wo Contrast Result Date: 09/29/2024 CLINICAL DATA:  Headache EXAM: CT HEAD WITHOUT CONTRAST TECHNIQUE: Contiguous axial images were obtained from the base of the  skull through the vertex without intravenous contrast. RADIATION DOSE REDUCTION: This exam was performed according to the departmental dose-optimization program which includes automated exposure control, adjustment of the mA and/or kV according to patient size and/or use  of iterative reconstruction technique. COMPARISON:  CT brain 12/08/2021, 12/17/2023 FINDINGS: Brain: No acute territorial infarction, hemorrhage or intracranial mass.atrophy and mild chronic small vessel ischemic changes of the white matter. 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 Stable ventricle size. Vascular: No hyperdense vessels.  Carotid vascular calcification Skull: Normal. Negative for fracture or focal lesion. Sinuses/Orbits: No acute finding. Other: None IMPRESSION: 1. No CT evidence for acute intracranial abnormality. 2. Atrophy and chronic small vessel ischemic changes of the white matter. 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 Electronically Signed   By: Luke Bun M.D.   On: 09/29/2024 15:16   DG Chest Port 1 View Result Date: 09/29/2024 CLINICAL DATA:  Weakness EXAM: PORTABLE CHEST 1 VIEW COMPARISON:  Chest radiograph dated 08/14/2024 FINDINGS: Normal lung volumes. Patchy left basilar opacity. Unchanged blunting of the left costophrenic angle. No pneumothorax. The heart size and mediastinal contours are within normal limits. No acute osseous abnormality. IMPRESSION: 1. Patchy left basilar opacity, likely atelectasis. 2. Unchanged blunting of the left costophrenic angle, which may represent a small pleural effusion. Electronically Signed   By: Limin  Xu M.D.   On: 09/29/2024 13:13        Scheduled Meds:  ALPRAZolam   0.5 mg Oral BID   aspirin  EC  81 mg Oral Daily   atorvastatin   80 mg Oral QPM   budesonide-glycopyrrolate-formoterol  2 puff Inhalation BID   enoxaparin  (LOVENOX ) injection  40 mg Subcutaneous Q24H   gabapentin   100 mg Oral BID   insulin  aspart  0-15 Units Subcutaneous Q4H   insulin  glargine-yfgn  26 Units Subcutaneous QHS   levothyroxine   125 mcg Oral Q0600   magnesium  oxide  400 mg Oral Daily   mirabegron ER  25 mg Oral Daily   pantoprazole   40 mg Oral Daily   ticagrelor   90 mg Oral BID   Continuous Infusions:  azithromycin  Stopped (09/29/24  2349)   cefTRIAXone  (ROCEPHIN )  IV Stopped (09/30/24 0034)   lactated ringers  75 mL/hr at 09/30/24 0434   metronidazole  500 mg (09/30/24 1235)          Derryl Duval, MD [facility] 09/30/2024, 2:03 PM

## 2024-09-30 NOTE — Consult Note (Signed)
 Urology Consult  Referring physician: Dr. Mcarthur Reason for referral: right hydronephrosis  Chief Complaint: right abdominal pain  History of Present Illness: Gabrielle Wood is a 78yo with a history of COPD, CAD, HTN, HLD admitted with confusion with concern for sepsis from pneumonia. 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 Patient states she has been having intermittent right pelvic pain which is sharp and intermittent for over 6 months. She denies a history of frequent UTIs. She denies any history of nephrolithiasis. She denies any history of gross hematuria. She does not recall anything that makes the pain better or worse. She denies dysuria. Urine culture showed multiple species. No fevers currently  Past Medical History:  Diagnosis Date   Arthritis    COPD (chronic obstructive pulmonary disease) (HCC)    Diabetes mellitus    DVT (deep venous thrombosis) (HCC)    years ago   GERD (gastroesophageal reflux disease)    H. pylori infection 2013   S/p treatment with amoxicillin , Biaxin, PPI   High cholesterol    Hypertension    Hypothyroidism    NSTEMI (non-ST elevated myocardial infarction) (HCC) 12/2020   s/p  PCI DES to the LCx   Thyroid  disease    Past Surgical History:  Procedure Laterality Date   BACK SURGERY     lumbar x3    ESOPHAGOGASTRODUODENOSCOPY  04/04/2012   Moderate gastritis/MODERATE Duodenitis in the duodenal bulb duodenum.  Pathology with chronic H. pylori gastritis.   EUS  05/14/2012   Normal pancreas, gallbladder,biliary tree/ Incidental finding of simple-appearing right renal cyst.   TONSILLECTOMY     TUBAL LIGATION     VEIN SURGERY     stripping years ago but recent laser surgery; bilateral legs    Medications: I have reviewed the patient's current medications. Allergies:  Allergies  Allergen Reactions   Amlodipine     Ciprocin-Fluocin-Procin [Fluocinolone]     Nightmares   Lipitor [Atorvastatin ] Other (See Comments)     Muscle aches, pain   Nebivolol    Nitrofuran Derivatives    Lisinopril Cough   Red Yeast Rice [Cholestin] Itching and Rash    Family History  Problem Relation Age of Onset   Pancreatic cancer Mother        diagnosed at age 20s, died from heart attack   Inflammatory bowel disease Brother        crohn's, his dgt deceased from colon cancer at age 69   Colon cancer Neg Hx    Social History:  reports that she has quit smoking. Her smoking use included cigarettes. She has never used smokeless tobacco. She reports that she does not drink alcohol  and does not use drugs.  Review of Systems  Genitourinary:  Positive for pelvic pain.  All other systems reviewed and are negative.   Physical Exam:  Vital signs in last 24 hours: Temp:  [97.7 F (36.5 C)-100 F (37.8 C)] 98.6 F (37 C) (10/29 1449) Pulse Rate:  [78-93] 79 (10/29 1449) Resp:  [16-26] 18 (10/29 1449) BP: (91-169)/(42-93) 105/71 (10/29 1449) SpO2:  [92 %-97 %] 96 % (10/29 1449) Physical Exam Vitals reviewed.  Constitutional:      Appearance: Normal appearance.  HENT:     Head: Normocephalic and atraumatic.     Mouth/Throat:     Mouth: Mucous membranes are dry.  Eyes:     Extraocular Movements: Extraocular movements intact.     Pupils: Pupils are equal, round, and reactive to light.  Cardiovascular:  Rate and Rhythm: Normal rate and regular rhythm.  Pulmonary:     Effort: Pulmonary effort is normal. No respiratory distress.  Abdominal:     General: Abdomen is flat. There is no distension.  Musculoskeletal:        General: No swelling. Normal range of motion.     Cervical back: Normal range of motion. No rigidity.  Skin:    General: Skin is warm and dry.  Neurological:     General: No focal deficit present.     Mental Status: She is alert and oriented to person, place, and time.  Psychiatric:        Mood and Affect: Mood normal.        Behavior: Behavior normal.        Thought Content: Thought content  normal.        Judgment: Judgment normal.     Laboratory Data:  Results for orders placed or performed during the hospital encounter of 09/29/24 (from the past 72 hours)  CBG monitoring, ED     Status: Abnormal   Collection Time: 09/29/24 12:29 PM  Result Value Ref Range   Glucose-Capillary 229 (H) 70 - 99 mg/dL    Comment: Glucose reference range applies only to samples taken after fasting for at least 8 hours.  Comprehensive metabolic panel     Status: Abnormal   Collection Time: 09/29/24 12:49 PM  Result Value Ref Range   Sodium 136 135 - 145 mmol/L   Potassium 3.8 3.5 - 5.1 mmol/L   Chloride 101 98 - 111 mmol/L   CO2 23 22 - 32 mmol/L   Glucose, Bld 270 (H) 70 - 99 mg/dL    Comment: Glucose reference range applies only to samples taken after fasting for at least 8 hours.   BUN 29 (H) 8 - 23 mg/dL   Creatinine, Ser 8.75 (H) 0.44 - 1.00 mg/dL   Calcium  9.7 8.9 - 10.3 mg/dL   Total Protein 7.4 6.5 - 8.1 g/dL   Albumin 3.8 3.5 - 5.0 g/dL   AST 17 15 - 41 U/L   ALT 13 0 - 44 U/L   Alkaline Phosphatase 56 38 - 126 U/L   Total Bilirubin 0.6 0.0 - 1.2 mg/dL   GFR, Estimated 44 (L) >60 mL/min    Comment: (NOTE) Calculated using the CKD-EPI Creatinine Equation (2021)    Anion gap 13 5 - 15    Comment: Performed at [facility], [address] [place], [place] [address]  CBC     Status: Abnormal   Collection Time: 09/29/24 12:49 PM  Result Value Ref Range   WBC 19.7 (H) 4.0 - 10.5 K/uL   RBC 3.99 3.87 - 5.11 MIL/uL   Hemoglobin 11.2 (L) 12.0 - 15.0 g/dL   HCT 65.7 (L) 63.9 - 53.9 %   MCV 85.7 80.0 - 100.0 fL   MCH 28.1 26.0 - 34.0 pg   MCHC 32.7 30.0 - 36.0 g/dL   RDW 84.7 88.4 - 84.4 %   Platelets 182 150 - 400 K/uL   nRBC 0.0 0.0 - 0.2 %    Comment: Performed at [facility], [address], [place], [place] [address]  Lactic acid, plasma     Status: None   Collection Time: 09/29/24 12:49 PM  Result Value Ref Range   Lactic Acid, Venous 1.7 0.5 - 1.9 mmol/L    Comment:  Performed at [facility], [address], [place], [place] [address]  Troponin T, High Sensitivity  Status: None   Collection Time: 09/29/24 12:49 PM  Result Value Ref Range   Troponin T High Sensitivity 19 0 - 19 ng/L    Comment: (NOTE) Biotin concentrations > 1000 ng/mL falsely decrease TnT results.  Serial cardiac troponin measurements are suggested.  Refer to the Links section for chest pain algorithms and additional  guidance. Performed at [facility], [address] [place], [place] [address]   Resp panel by RT-PCR (RSV, Flu A&B, Covid) Anterior Nasal Swab     Status: None   Collection Time: 09/29/24 12:49 PM   Specimen: Anterior Nasal Swab  Result Value Ref Range   SARS Coronavirus 2 by RT PCR NEGATIVE NEGATIVE    Comment: (NOTE) SARS-CoV-2 target nucleic acids are NOT DETECTED.  The SARS-CoV-2 RNA is generally detectable in upper respiratory specimens during the acute phase of infection. The lowest concentration of SARS-CoV-2 viral copies this assay can detect is 138 copies/mL. A negative result does not preclude SARS-Cov-2 infection and should not be used as the sole basis for treatment or other patient management decisions. A negative result may occur with  improper specimen collection/handling, submission of specimen other than nasopharyngeal swab, presence of viral mutation(s) within the areas targeted by this assay, and inadequate number of viral copies(<138 copies/mL). A negative result must be combined with clinical observations, patient history, and epidemiological information. The expected result is Negative.  Fact Sheet for Patients:  bloggercourse.com  Fact Sheet for Healthcare Providers:  seriousbroker.it  This test is no t yet approved or cleared by the [place]  FDA and  has been authorized for detection and/or diagnosis of SARS-CoV-2 by FDA under an Emergency Use Authorization (EUA). This EUA  will remain  in effect (meaning this test can be used) for the duration of the COVID-19 declaration under Section 564(b)(1) of the Act, 21 U.S.C.section 360bbb-3(b)(1), unless the authorization is terminated  or revoked sooner.       Influenza A by PCR NEGATIVE NEGATIVE   Influenza B by PCR NEGATIVE NEGATIVE    Comment: (NOTE) The Xpert Xpress SARS-CoV-2/FLU/RSV plus assay is intended as an aid in the diagnosis of influenza from Nasopharyngeal swab specimens and should not be used as a sole basis for treatment. Nasal washings and aspirates are unacceptable for Xpert Xpress SARS-CoV-2/FLU/RSV testing.  Fact Sheet for Patients: bloggercourse.com  Fact Sheet for Healthcare Providers: seriousbroker.it  This test is not yet approved or cleared by the [place]  FDA and has been authorized for detection and/or diagnosis of SARS-CoV-2 by FDA under an Emergency Use Authorization (EUA). This EUA will remain in effect (meaning this test can be used) for the duration of the COVID-19 declaration under Section 564(b)(1) of the Act, 21 U.S.C. section 360bbb-3(b)(1), unless the authorization is terminated or revoked.     Resp Syncytial Virus by PCR NEGATIVE NEGATIVE    Comment: (NOTE) Fact Sheet for Patients: bloggercourse.com  Fact Sheet for Healthcare Providers: seriousbroker.it  This test is not yet approved or cleared by the [place]  FDA and has been authorized for detection and/or diagnosis of SARS-CoV-2 by FDA under an Emergency Use Authorization (EUA). This EUA will remain in effect (meaning this test can be used) for the duration of the COVID-19 declaration under Section 564(b)(1) of the Act, 21 U.S.C. section 360bbb-3(b)(1), unless the authorization is terminated or revoked.  Performed at [facility], [address], [place], [place] [address]   Culture, blood (routine  x 2)     Status: None (Preliminary result)  Collection Time: 09/29/24 12:50 PM   Specimen: BLOOD  Result Value Ref Range   Specimen Description BLOOD BLOOD LEFT FOREARM    Special Requests      BOTTLES DRAWN AEROBIC AND ANAEROBIC Blood Culture adequate volume   Culture      NO GROWTH < 24 HOURS Performed at [facility], [address], [place], [place] [address]    Report Status PENDING   Culture, blood (routine x 2)     Status: None (Preliminary result)   Collection Time: 09/29/24 12:55 PM   Specimen: BLOOD  Result Value Ref Range   Specimen Description BLOOD RIGHT ANTECUBITAL    Special Requests      BOTTLES DRAWN AEROBIC AND ANAEROBIC Blood Culture adequate volume   Culture      NO GROWTH < 24 HOURS Performed at [facility], [address] [place], [place] [address]    Report Status PENDING   Blood gas, venous     Status: Abnormal   Collection Time: 09/29/24 12:55 PM  Result Value Ref Range   pH, Ven 7.41 7.25 - 7.43   pCO2, Ven 39 (L) 44 - 60 mmHg   pO2, Ven <31 (LL) 32 - 45 mmHg    Comment: CRITICAL RESULT CALLED TO, READ BACK BY AND VERIFIED WITH: FLETCHER,A  AT 1:08PM ON  09/29/24 BY FESTERMAN,C    Bicarbonate 24.8 20.0 - 28.0 mmol/L   Acid-Base Excess 0.0 0.0 - 2.0 mmol/L   O2 Saturation 30.9 %   Patient temperature 36.4    Collection site RIGHT ANTECUBITAL    Drawn by [phone]     Comment: Performed at [facility], [address], [place], [place] [address]  Troponin T, High Sensitivity     Status: None   Collection Time: 09/29/24  3:03 PM  Result Value Ref Range   Troponin T High Sensitivity <15 0 - 19 ng/L    Comment: (NOTE) Biotin concentrations > 1000 ng/mL falsely decrease TnT results.  Serial cardiac troponin measurements are suggested.  Refer to the Links section for chest pain algorithms and additional  guidance. Performed at [facility], [address] [place], [place] [address]   Urinalysis, Routine w reflex microscopic -Urine, Clean Catch      Status: Abnormal   Collection Time: 09/29/24  5:16 PM  Result Value Ref Range   Color, Urine YELLOW YELLOW   APPearance CLEAR CLEAR   Specific Gravity, Urine 1.014 1.005 - 1.030   pH 6.0 5.0 - 8.0   Glucose, UA >=500 (A) NEGATIVE mg/dL   Hgb urine dipstick SMALL (A) NEGATIVE   Bilirubin Urine NEGATIVE NEGATIVE   Ketones, ur NEGATIVE NEGATIVE mg/dL   Protein, ur NEGATIVE NEGATIVE mg/dL   Nitrite NEGATIVE NEGATIVE   Leukocytes,Ua NEGATIVE NEGATIVE   RBC / HPF 0-5 0 - 5 RBC/hpf   WBC, UA 0-5 0 - 5 WBC/hpf   Bacteria, UA RARE (A) NONE SEEN   Squamous Epithelial / HPF 0-5 0 - 5 /HPF    Comment: Performed at [facility], [address] [place], [place] [address]  Glucose, capillary     Status: Abnormal   Collection Time: 09/29/24  8:32 PM  Result Value Ref Range   Glucose-Capillary 425 (H) 70 - 99 mg/dL    Comment: Performed at [facility], [address], [place], [place] [address]  Glucose, capillary     Status: Abnormal   Collection Time: 09/29/24 10:29 PM  Result Value Ref Range   Glucose-Capillary 472 (  H) 70 - 99 mg/dL    Comment: Glucose reference range applies only to samples taken after fasting for at least 8 hours.  Glucose, capillary     Status: Abnormal   Collection Time: 09/30/24  3:37 AM  Result Value Ref Range   Glucose-Capillary 201 (H) 70 - 99 mg/dL    Comment: Glucose reference range applies only to samples taken after fasting for at least 8 hours.  Basic metabolic panel     Status: Abnormal   Collection Time: 09/30/24  4:56 AM  Result Value Ref Range   Sodium 137 135 - 145 mmol/L   Potassium 3.2 (L) 3.5 - 5.1 mmol/L   Chloride 103 98 - 111 mmol/L   CO2 23 22 - 32 mmol/L   Glucose, Bld 186 (H) 70 - 99 mg/dL    Comment: Glucose reference range applies only to samples taken after fasting for at least 8 hours.   BUN 22 8 - 23 mg/dL   Creatinine, Ser 9.11 0.44 - 1.00 mg/dL   Calcium  9.4 8.9 - 10.3 mg/dL   GFR, Estimated >39 >39 mL/min    Comment:  (NOTE) Calculated using the CKD-EPI Creatinine Equation (2021)    Anion gap 12 5 - 15    Comment: Performed at [facility], [address], [place], [place] [address]  CBC     Status: Abnormal   Collection Time: 09/30/24  4:56 AM  Result Value Ref Range   WBC 15.3 (H) 4.0 - 10.5 K/uL   RBC 3.69 (L) 3.87 - 5.11 MIL/uL   Hemoglobin 10.3 (L) 12.0 - 15.0 g/dL   HCT 68.7 (L) 63.9 - 53.9 %   MCV 84.6 80.0 - 100.0 fL   MCH 27.9 26.0 - 34.0 pg   MCHC 33.0 30.0 - 36.0 g/dL   RDW 84.8 88.4 - 84.4 %   Platelets 158 150 - 400 K/uL   nRBC 0.0 0.0 - 0.2 %    Comment: Performed at [facility], [address], [place], [place] [address]  Glucose, capillary     Status: Abnormal   Collection Time: 09/30/24  7:14 AM  Result Value Ref Range   Glucose-Capillary 119 (H) 70 - 99 mg/dL    Comment: Glucose reference range applies only to samples taken after fasting for at least 8 hours.   Comment 1 Notify RN    Comment 2 Document in Chart   Glucose, capillary     Status: Abnormal   Collection Time: 09/30/24 11:34 AM  Result Value Ref Range   Glucose-Capillary 374 (H) 70 - 99 mg/dL    Comment: Glucose reference range applies only to samples taken after fasting for at least 8 hours.   Comment 1 Notify RN    Comment 2 Document in Chart    Recent Results (from the past 240 hours)  Resp panel by RT-PCR (RSV, Flu A&B, Covid) Anterior Nasal Swab     Status: None   Collection Time: 09/29/24 12:49 PM   Specimen: Anterior Nasal Swab  Result Value Ref Range Status   SARS Coronavirus 2 by RT PCR NEGATIVE NEGATIVE Final    Comment: (NOTE) SARS-CoV-2 target nucleic acids are NOT DETECTED.  The SARS-CoV-2 RNA is generally detectable in upper respiratory specimens during the acute phase of infection. The lowest concentration of SARS-CoV-2 viral copies this assay can detect is 138 copies/mL. A negative result does not preclude SARS-Cov-2 infection and should not be used as the sole basis for treatment or other  patient  management decisions. A negative result may occur with  improper specimen collection/handling, submission of specimen other than nasopharyngeal swab, presence of viral mutation(s) within the areas targeted by this assay, and inadequate number of viral copies(<138 copies/mL). A negative result must be combined with clinical observations, patient history, and epidemiological information. The expected result is Negative.  Fact Sheet for Patients:  bloggercourse.com  Fact Sheet for Healthcare Providers:  seriousbroker.it  This test is no t yet approved or cleared by the [place]  FDA and  has been authorized for detection and/or diagnosis of SARS-CoV-2 by FDA under an Emergency Use Authorization (EUA). This EUA will remain  in effect (meaning this test can be used) for the duration of the COVID-19 declaration under Section 564(b)(1) of the Act, 21 U.S.C.section 360bbb-3(b)(1), unless the authorization is terminated  or revoked sooner.       Influenza A by PCR NEGATIVE NEGATIVE Final   Influenza B by PCR NEGATIVE NEGATIVE Final    Comment: (NOTE) The Xpert Xpress SARS-CoV-2/FLU/RSV plus assay is intended as an aid in the diagnosis of influenza from Nasopharyngeal swab specimens and should not be used as a sole basis for treatment. Nasal washings and aspirates are unacceptable for Xpert Xpress SARS-CoV-2/FLU/RSV testing.  Fact Sheet for Patients: bloggercourse.com  Fact Sheet for Healthcare Providers: seriousbroker.it  This test is not yet approved or cleared by the [place]  FDA and has been authorized for detection and/or diagnosis of SARS-CoV-2 by FDA under an Emergency Use Authorization (EUA). This EUA will remain in effect (meaning this test can be used) for the duration of the COVID-19 declaration under Section 564(b)(1) of the Act, 21 U.S.C. section  360bbb-3(b)(1), unless the authorization is terminated or revoked.     Resp Syncytial Virus by PCR NEGATIVE NEGATIVE Final    Comment: (NOTE) Fact Sheet for Patients: bloggercourse.com  Fact Sheet for Healthcare Providers: seriousbroker.it  This test is not yet approved or cleared by the [place]  FDA and has been authorized for detection and/or diagnosis of SARS-CoV-2 by FDA under an Emergency Use Authorization (EUA). This EUA will remain in effect (meaning this test can be used) for the duration of the COVID-19 declaration under Section 564(b)(1) of the Act, 21 U.S.C. section 360bbb-3(b)(1), unless the authorization is terminated or revoked.  Performed at [facility], [address], [place], [place] [address]   Culture, blood (routine x 2)     Status: None (Preliminary result)   Collection Time: 09/29/24 12:50 PM   Specimen: BLOOD  Result Value Ref Range Status   Specimen Description BLOOD BLOOD LEFT FOREARM  Final   Special Requests   Final    BOTTLES DRAWN AEROBIC AND ANAEROBIC Blood Culture adequate volume   Culture   Final    NO GROWTH < 24 HOURS Performed at [facility], [address], [place], [place] [address]    Report Status PENDING  Incomplete  Culture, blood (routine x 2)     Status: None (Preliminary result)   Collection Time: 09/29/24 12:55 PM   Specimen: BLOOD  Result Value Ref Range Status   Specimen Description BLOOD RIGHT ANTECUBITAL  Final   Special Requests   Final    BOTTLES DRAWN AEROBIC AND ANAEROBIC Blood Culture adequate volume   Culture   Final    NO GROWTH < 24 HOURS Performed at [facility], [address], [place], [place] [address]    Report Status PENDING  Incomplete   Creatinine: Recent Labs    09/29/24 1249 09/30/24  0456  CREATININE 1.24* 0.88   Baseline Creatinine: 0.9  Impression/Assessment:  78yo with right UPJ obstruction  Plan:  We discussed the natural history of  UPJ obstructions and the workup for symptomatic UPJ obstructions including lasix renogram. We will schedule her for a renogram as an outpatient. We also discussed the management of UPJ obstructions including observation, ureteral stent placement, nephrostomy tube placement and pyeloplasty. Currently she in minimally symptomatic and we have elected to pursue observation.   Gabrielle Wood 09/30/2024, 3:14 PM

## 2024-09-30 NOTE — Plan of Care (Signed)
  Problem: Health Behavior/Discharge Planning: Goal: Ability to manage health-related needs will improve Outcome: Progressing   Problem: Clinical Measurements: Goal: Diagnostic test results will improve Outcome: Progressing   Problem: Nutrition: Goal: Adequate nutrition will be maintained Outcome: Progressing   Problem: Pain Managment: Goal: General experience of comfort will improve and/or be controlled Outcome: Progressing   Problem: Skin Integrity: Goal: Risk for impaired skin integrity will decrease Outcome: Progressing

## 2024-09-30 NOTE — Progress Notes (Addendum)
 Clarified with Dr Pearlean if she wants blood sugar to be checked and covered at 0000 as we have just checked it at 2229, per Dr. Pearlean, to skip 0000 and check at 0300 for sliding scale. Q4 cbg until sugar is controlled.

## 2024-09-30 NOTE — Progress Notes (Signed)
 Gabrielle Wood has her red cell phone with her in the bed as well as 4 rings that are currently on her hands

## 2024-09-30 NOTE — Inpatient Diabetes Management (Signed)
 Inpatient Diabetes Program Recommendations  AACE/ADA: New Consensus Statement on Inpatient Glycemic Control   Target Ranges:  Prepandial:   less than 140 mg/dL      Peak postprandial:   less than 180 mg/dL (1-2 hours)      Critically ill patients:  140 - 180 mg/dL    Latest Reference Range & Units 09/29/24 12:29 09/29/24 22:29 09/30/24 03:37 09/30/24 07:14  Glucose-Capillary 70 - 99 mg/dL 770 (H) 527 (H) 798 (H) 119 (H)   Review of Glycemic Control  Diabetes history: DM2 Outpatient Diabetes medications: Tresiba  26 units at bedtime, Humalog  8-14 units TID with meals Current orders for Inpatient glycemic control: Semglee  26 units at bedtime, Novolog  0-15 units Q4H  Inpatient Diabetes Program Recommendations:    Insulin : Please consider increasing Semglee  to 30 units at bedtime, changing CBGs to AC&HS, and changing Novolog  0-15 units to AC&HS.  If post prandial glucose remains consistently over 180 mg/dl, may need to add Novolog  meal coverage.  Thanks, Earnie Gainer, RN, MSN, CDCES Diabetes Coordinator Inpatient Diabetes Program [phone] (Team Pager from 8am to 5pm)

## 2024-10-01 DIAGNOSIS — Z7189 Other specified counseling: Secondary | ICD-10-CM | POA: Diagnosis not present

## 2024-10-01 DIAGNOSIS — J189 Pneumonia, unspecified organism: Secondary | ICD-10-CM | POA: Diagnosis not present

## 2024-10-01 DIAGNOSIS — Q6239 Other obstructive defects of renal pelvis and ureter: Secondary | ICD-10-CM

## 2024-10-01 DIAGNOSIS — Z515 Encounter for palliative care: Secondary | ICD-10-CM

## 2024-10-01 DIAGNOSIS — R4589 Other symptoms and signs involving emotional state: Secondary | ICD-10-CM

## 2024-10-01 DIAGNOSIS — A419 Sepsis, unspecified organism: Secondary | ICD-10-CM | POA: Diagnosis not present

## 2024-10-01 DIAGNOSIS — N135 Crossing vessel and stricture of ureter without hydronephrosis: Secondary | ICD-10-CM

## 2024-10-01 DIAGNOSIS — Z66 Do not resuscitate: Secondary | ICD-10-CM

## 2024-10-01 DIAGNOSIS — Z789 Other specified health status: Secondary | ICD-10-CM

## 2024-10-01 DIAGNOSIS — R531 Weakness: Secondary | ICD-10-CM

## 2024-10-01 HISTORY — DX: Other obstructive defects of renal pelvis and ureter: Q62.39

## 2024-10-01 LAB — GLUCOSE, CAPILLARY
Glucose-Capillary: 107 mg/dL — ABNORMAL HIGH (ref 70–99)
Glucose-Capillary: 119 mg/dL — ABNORMAL HIGH (ref 70–99)
Glucose-Capillary: 177 mg/dL — ABNORMAL HIGH (ref 70–99)
Glucose-Capillary: 182 mg/dL — ABNORMAL HIGH (ref 70–99)
Glucose-Capillary: 228 mg/dL — ABNORMAL HIGH (ref 70–99)
Glucose-Capillary: 231 mg/dL — ABNORMAL HIGH (ref 70–99)
Glucose-Capillary: 271 mg/dL — ABNORMAL HIGH (ref 70–99)
Glucose-Capillary: 325 mg/dL — ABNORMAL HIGH (ref 70–99)
Glucose-Capillary: 398 mg/dL — ABNORMAL HIGH (ref 70–99)

## 2024-10-01 LAB — CBC WITH DIFFERENTIAL/PLATELET
Abs Immature Granulocytes: 0.02 K/uL (ref 0.00–0.07)
Basophils Absolute: 0 K/uL (ref 0.0–0.1)
Basophils Relative: 0 %
Eosinophils Absolute: 0.2 K/uL (ref 0.0–0.5)
Eosinophils Relative: 5 %
HCT: 30 % — ABNORMAL LOW (ref 36.0–46.0)
Hemoglobin: 9.5 g/dL — ABNORMAL LOW (ref 12.0–15.0)
Immature Granulocytes: 0 %
Lymphocytes Relative: 18 %
Lymphs Abs: 0.9 K/uL (ref 0.7–4.0)
MCH: 27.1 pg (ref 26.0–34.0)
MCHC: 31.7 g/dL (ref 30.0–36.0)
MCV: 85.7 fL (ref 80.0–100.0)
Monocytes Absolute: 0.5 K/uL (ref 0.1–1.0)
Monocytes Relative: 12 %
Neutro Abs: 3 K/uL (ref 1.7–7.7)
Neutrophils Relative %: 65 %
Platelets: 150 K/uL (ref 150–400)
RBC: 3.5 MIL/uL — ABNORMAL LOW (ref 3.87–5.11)
RDW: 15.1 % (ref 11.5–15.5)
WBC: 4.6 K/uL (ref 4.0–10.5)
nRBC: 0 % (ref 0.0–0.2)

## 2024-10-01 LAB — URINE CULTURE

## 2024-10-01 LAB — BASIC METABOLIC PANEL WITH GFR
Anion gap: 10 (ref 5–15)
BUN: 16 mg/dL (ref 8–23)
CO2: 24 mmol/L (ref 22–32)
Calcium: 9.4 mg/dL (ref 8.9–10.3)
Chloride: 104 mmol/L (ref 98–111)
Creatinine, Ser: 0.73 mg/dL (ref 0.44–1.00)
GFR, Estimated: >60 mL/min (ref >60)
Glucose, Bld: 262 mg/dL — ABNORMAL HIGH (ref 70–99)
Potassium: 3.9 mmol/L (ref 3.5–5.1)
Sodium: 138 mmol/L (ref 135–145)

## 2024-10-01 MED ORDER — SENNOSIDES-DOCUSATE SODIUM 8.6-50 MG PO TABS
1.0000 | ORAL_TABLET | Freq: Two times a day (BID) | ORAL | Status: DC
Start: 2024-10-01 — End: 2024-10-02
  Administered 2024-10-01 – 2024-10-02 (×3): 1 via ORAL
  Filled 2024-10-01 (×3): qty 1

## 2024-10-01 MED ORDER — MORPHINE SULFATE (PF) 2 MG/ML IV SOLN: 2.0000 mg | INTRAVENOUS | Status: DC | PRN

## 2024-10-01 MED ORDER — METOPROLOL SUCCINATE ER 25 MG PO TB24
25.0000 mg | ORAL_TABLET | Freq: Every day | ORAL | Status: DC
Start: 2024-10-01 — End: 2024-10-02
  Administered 2024-10-01 – 2024-10-02 (×2): 25 mg via ORAL
  Filled 2024-10-01 (×2): qty 1

## 2024-10-01 MED ORDER — NALOXONE HCL 0.4 MG/ML IJ SOLN: 0.4000 mg | INTRAMUSCULAR | Status: DC | PRN

## 2024-10-01 MED ORDER — HYDROCODONE-ACETAMINOPHEN 5-325 MG PO TABS
1.0000 | ORAL_TABLET | Freq: Four times a day (QID) | ORAL | Status: DC | PRN
  Administered 2024-10-01: 1 via ORAL
  Filled 2024-10-01: qty 1

## 2024-10-01 MED ORDER — POLYETHYLENE GLYCOL 3350 17 G PO PACK
17.0000 g | PACK | Freq: Every day | ORAL | Status: DC
Start: 2024-10-01 — End: 2024-10-01

## 2024-10-01 NOTE — Progress Notes (Addendum)
 Mobility Specialist Progress Note:    10/01/24 1335  Mobility  Activity Pivoted/transferred from bed to chair  Level of Assistance Minimal assist, patient does 75% or more  Assistive Device Front wheel walker  Distance Ambulated (ft) 3 ft  Range of Motion/Exercises Active;All extremities  Activity Response Tolerated well  Mobility Referral Yes  Mobility visit 1 Mobility  Mobility Specialist Start Time (ACUTE ONLY) 1335  Mobility Specialist Stop Time (ACUTE ONLY) 1355  Mobility Specialist Time Calculation (min) (ACUTE ONLY) 20 min   Pt received in bed, agreeable to mobility. Required MinA to stand and SBA to ambulate with RW. Tolerated well,asx throughout. Call bell in reach, all needs met. Emylia Latella Mobility Specialist Please contact via Special Educational Needs Teacher or  Rehab office at [phone]

## 2024-10-01 NOTE — Progress Notes (Addendum)
 PROGRESS NOTE    Gabrielle Wood  FMW:[id number] DOB: 10-Oct-1946 DOA: 09/29/2024 PCP: [name], MD   Brief Narrative:    Gabrielle Wood is a 78 y.o. female with medical history significant for hypertension, diabetes mellitus, hypothyroidism. Patient was brought to the ED with several complaints-headaches, generalized weakness, confused speech, lower abdominal pain, dry cough, left chest pain.    Reports that yesterday, patient's speech was confused, her speech was not slurred-per initial ED notes.  No vomiting no diarrhea.  She has had good oral intake.  No difficulty breathing.  No dizziness.  No focal heaviness or weakness of her extremities.  She reports weakness all over.  Patient lives alone, but has family- son, that lives nearby.   Recently hospitalized 9/12 to 9/17 for sepsis secondary to pneumonia, with AKI and failure to thrive.   ED Course: Temperature 97.5.  Heart rate 78-90.  Respirate rate 17-36.  Blood pressure systolic 82-169.  O2 sats 90 to 94% on room air. WBC 19.7.  Lactic acid 1.7.  Troponin 19.  COVID influenza RSV negative. Creatinine elevated 1.24. CT chest finding suggestive of infection of potential aspiration, also moderate right-sided hydronephrosis to the level of UPJ slightly progressed compared to prior CT.  Mild ascending colonic wall thickening-correlate for colitis type symptoms. IV cefepime  metronidazole  and vancomycin  started.  2 L bolus given.  Assessment & Plan:   Principal Problem:   CAP (community acquired pneumonia) Active Problems:   AKI (acute kidney injury)   Benign hypertension   Hypothyroidism   Rheumatoid arthritis (HCC)   Diabetes mellitus (HCC)   UPJ obstruction, congenital   Pneumonia-presenting with cough, WBC 19.7, tachypnea respirate rate 17-36.  Admitted just over 5 weeks ago also with pneumonia.  CT chest-bronchial wall thickening, persistent subpleural left greater than right consolidation and trace pleural effusions,  small focus of consolidation in lingula slightly progressive,-infection of potential aspiration. -Continue IV ceftriaxone  and azithromycin  - Swallow evaluation   Acute metabolic encephalopathy-confused speech, no slurred speech or focal neurologic deficits.  Mental status has improved at this time.  Likely secondary to pneumonia, sepsis.  Head CT negative for acute abnormality, chronic lacunar infarct, atrophy. - IV fluids, IV antibiotics-covering pneumonia, UTI, colitis   Severe sepsis-meeting criteria with leukocytosis of 19.7, tachypnea respiratory 17-36.  With evidence of endorgan dysfunction-AKI.  Secondary to pneumonia.  UA not suggestive of UTI.  But CT abdomen possible mild urothelial thickening of proximal right ureter, question mild inflammatory or infectious process.  Also suggest colitis involving the ascending colon.  Patient reports abdominal pain, denies diarrhea. - Blood cultures negative for 48 hours, urine culture with mixed growth likely contamination.    Right hydronephrosis-present previously, now worsening.  CT today - moderate right-sided hydronephrosis to the level of the UPJ, stable to slightly progressive compared with most recent prior CT. Possible mild urothelial thickening of proximal right ureter, question mild inflammatory or infectious process. UPJ obstruction/proximal ureteral stricture also considered. - Urology consult appreciated.  Currently no indication for inpatient intervention.  Will need to follow-up outpatient.  AKI-creatinine 1.24 has normalized to 0.73.  Labs from today reviewed.  Hypertension-blood pressure slightly rising.  Will continue to hold valsartan at this time but will resume metoprolol .  Uncontrolled diabetes mellitus with hyperglycemia-glucose 425. A1c 9.7. - SSI- M - Resume long-acting insulin  26 units daily   Coronary artery disease- s/p PCI with stent - Continue Brilinta , aspirin , atorvastatin    DVT prophylaxis: Lovenox  Code Status:  Full code-  Family  Communication: Spoke with son over the phone.   Disposition Plan: Home with home health, will have PT evaluation.  Potential discharge 10/30 Consults called: urology Admission status: inpt tele I certify that at the point of admission it is my clinical judgment that the patient will require inpatient hospital care spanning beyond 2 midnights from the point of admission due to high intensity of service, high risk for further deterioration and high frequency of surveillance required.    Subjective:  Patient seen and examined at the bedside.  No family members present.  She appears comfortable.  He is more interactive today.  Reports of some pain in the left chest as well as abdomen.  Son says the left-sided pain is going on for some time.  Denies any fever chills cough, shortness of breath.  Vital signs are stable.  Blood pressure has been better today.  Objective: Vitals:   09/30/24 1946 09/30/24 2012 10/01/24 0443 10/01/24 1305  BP:  138/71 130/72 131/82  Pulse:  83 65 87  Resp:  18 18 18   Temp:  98.4 F (36.9 C) (!) 97.5 F (36.4 C) 97.8 F (36.6 C)  TempSrc:  Oral Oral Oral  SpO2: 95% 94% 95% 97%  Weight:      Height:        Intake/Output Summary (Last 24 hours) at 10/01/2024 1326 Last data filed at 10/01/2024 1304 Gross per 24 hour  Intake 240 ml  Output 1550 ml  Net -1310 ml   Filed Weights   09/29/24 1230  Weight: 66.7 kg    Examination:  General exam: Appears calm and comfortable  Respiratory system: Bilateral decreased breath sounds at bases Cardiovascular system: S1 & S2 heard, Rate controlled Gastrointestinal system: Abdomen is nondistended, soft and mildly tender to lower quadrants.   Extremities: No cyanosis, clubbing, edema  Central nervous system: Alert and oriented. No focal neurological deficits. Moving extremities Skin: No rashes, lesions or ulcers  Data Reviewed: I have personally reviewed following labs and imaging  studies  CBC: Recent Labs  Lab 09/29/24 1249 09/30/24 0456 10/01/24 0913  WBC 19.7* 15.3* 4.6  NEUTROABS  --   --  3.0  HGB 11.2* 10.3* 9.5*  HCT 34.2* 31.2* 30.0*  MCV 85.7 84.6 85.7  PLT 182 158 150   Basic Metabolic Panel: Recent Labs  Lab 09/29/24 1249 09/30/24 0456 10/01/24 0913  NA 136 137 138  K 3.8 3.2* 3.9  CL 101 103 104  CO2 23 23 24   GLUCOSE 270* 186* 262*  BUN 29* 22 16  CREATININE 1.24* 0.88 0.73  CALCIUM  9.7 9.4 9.4   GFR: Estimated Creatinine Clearance: 55.5 mL/min (by C-G formula based on SCr of 0.73 mg/dL). Liver Function Tests: Recent Labs  Lab 09/29/24 1249  AST 17  ALT 13  ALKPHOS 56  BILITOT 0.6  PROT 7.4  ALBUMIN 3.8   No results for input(s): LIPASE, AMYLASE in the last 168 hours. No results for input(s): AMMONIA in the last 168 hours. Coagulation Profile: No results for input(s): INR, PROTIME in the last 168 hours. Cardiac Enzymes: No results for input(s): CKTOTAL, CKMB, CKMBINDEX, TROPONINI in the last 168 hours. BNP (last 3 results) No results for input(s): PROBNP in the last 8760 hours. HbA1C: No results for input(s): HGBA1C in the last 72 hours. CBG: Recent Labs  Lab 10/01/24 0117 10/01/24 0310 10/01/24 0345 10/01/24 0752 10/01/24 1127  GLUCAP 182* 119* 107* 177* 325*   Lipid Profile: No results for input(s): CHOL, HDL, LDLCALC, TRIG,  CHOLHDL, LDLDIRECT in the last 72 hours. Thyroid  Function Tests: No results for input(s): TSH, T4TOTAL, FREET4, T3FREE, THYROIDAB in the last 72 hours. Anemia Panel: No results for input(s): VITAMINB12, FOLATE, FERRITIN, TIBC, IRON, RETICCTPCT in the last 72 hours. Sepsis Labs: Recent Labs  Lab 09/29/24 1249  LATICACIDVEN 1.7    Recent Results (from the past 240 hours)  Resp panel by RT-PCR (RSV, Flu A&B, Covid) Anterior Nasal Swab     Status: None   Collection Time: 09/29/24 12:49 PM   Specimen: Anterior Nasal Swab  Result  Value Ref Range Status   SARS Coronavirus 2 by RT PCR NEGATIVE NEGATIVE Final    Comment: (NOTE) SARS-CoV-2 target nucleic acids are NOT DETECTED.  The SARS-CoV-2 RNA is generally detectable in upper respiratory specimens during the acute phase of infection. The lowest concentration of SARS-CoV-2 viral copies this assay can detect is 138 copies/mL. A negative result does not preclude SARS-Cov-2 infection and should not be used as the sole basis for treatment or other patient management decisions. A negative result may occur with  improper specimen collection/handling, submission of specimen other than nasopharyngeal swab, presence of viral mutation(s) within the areas targeted by this assay, and inadequate number of viral copies(<138 copies/mL). A negative result must be combined with clinical observations, patient history, and epidemiological information. The expected result is Negative.  Fact Sheet for Patients:  bloggercourse.com  Fact Sheet for Healthcare Providers:  seriousbroker.it  This test is no t yet approved or cleared by the [place]  FDA and  has been authorized for detection and/or diagnosis of SARS-CoV-2 by FDA under an Emergency Use Authorization (EUA). This EUA will remain  in effect (meaning this test can be used) for the duration of the COVID-19 declaration under Section 564(b)(1) of the Act, 21 U.S.C.section 360bbb-3(b)(1), unless the authorization is terminated  or revoked sooner.       Influenza A by PCR NEGATIVE NEGATIVE Final   Influenza B by PCR NEGATIVE NEGATIVE Final    Comment: (NOTE) The Xpert Xpress SARS-CoV-2/FLU/RSV plus assay is intended as an aid in the diagnosis of influenza from Nasopharyngeal swab specimens and should not be used as a sole basis for treatment. Nasal washings and aspirates are unacceptable for Xpert Xpress SARS-CoV-2/FLU/RSV testing.  Fact Sheet for  Patients: bloggercourse.com  Fact Sheet for Healthcare Providers: seriousbroker.it  This test is not yet approved or cleared by the [place]  FDA and has been authorized for detection and/or diagnosis of SARS-CoV-2 by FDA under an Emergency Use Authorization (EUA). This EUA will remain in effect (meaning this test can be used) for the duration of the COVID-19 declaration under Section 564(b)(1) of the Act, 21 U.S.C. section 360bbb-3(b)(1), unless the authorization is terminated or revoked.     Resp Syncytial Virus by PCR NEGATIVE NEGATIVE Final    Comment: (NOTE) Fact Sheet for Patients: bloggercourse.com  Fact Sheet for Healthcare Providers: seriousbroker.it  This test is not yet approved or cleared by the [place]  FDA and has been authorized for detection and/or diagnosis of SARS-CoV-2 by FDA under an Emergency Use Authorization (EUA). This EUA will remain in effect (meaning this test can be used) for the duration of the COVID-19 declaration under Section 564(b)(1) of the Act, 21 U.S.C. section 360bbb-3(b)(1), unless the authorization is terminated or revoked.  Performed at [facility], [address] [place], [place] [address]   Culture, blood (routine x 2)     Status: None (Preliminary result)   Collection Time: 09/29/24 12:50  PM   Specimen: BLOOD  Result Value Ref Range Status   Specimen Description BLOOD BLOOD LEFT FOREARM  Final   Special Requests   Final    BOTTLES DRAWN AEROBIC AND ANAEROBIC Blood Culture adequate volume   Culture   Final    NO GROWTH 2 DAYS Performed at [facility], [address] [place], [place] [address]    Report Status PENDING  Incomplete  Culture, blood (routine x 2)     Status: None (Preliminary result)   Collection Time: 09/29/24 12:55 PM   Specimen: BLOOD  Result Value Ref Range Status   Specimen Description BLOOD RIGHT  ANTECUBITAL  Final   Special Requests   Final    BOTTLES DRAWN AEROBIC AND ANAEROBIC Blood Culture adequate volume   Culture   Final    NO GROWTH 2 DAYS Performed at [facility], [address], [place], [place] [address]    Report Status PENDING  Incomplete  Urine Culture (for pregnant, neutropenic or urologic patients or patients with an indwelling urinary catheter)     Status: Abnormal   Collection Time: 09/29/24  5:16 PM   Specimen: Urine, Clean Catch  Result Value Ref Range Status   Specimen Description   Final    URINE, CLEAN CATCH Performed at [facility], [address] [place], [place] [address]    Special Requests   Final    NONE Performed at [facility], [address] [place], [place] [address]    Culture MULTIPLE SPECIES PRESENT, SUGGEST RECOLLECTION (A)  Final   Report Status 10/01/2024 FINAL  Final         Radiology Studies: CT CHEST ABDOMEN PELVIS W CONTRAST Result Date: 09/29/2024 CLINICAL DATA:  Left-sided chest pain right lower quadrant abdominal pain EXAM: CT CHEST, ABDOMEN, AND PELVIS WITH CONTRAST TECHNIQUE: Multidetector CT imaging of the chest, abdomen and pelvis was performed following the standard protocol during bolus administration of intravenous contrast. RADIATION DOSE REDUCTION: This exam was performed according to the departmental dose-optimization program which includes automated exposure control, adjustment of the mA and/or kV according to patient size and/or use of iterative reconstruction technique. CONTRAST:  80mL OMNIPAQUE  IOHEXOL  300 MG/ML  SOLN COMPARISON:  CT 08/15/2024, 01/06/2024, CT 11/04/2022, 07/05/2022, 10/14/2021 FINDINGS: CT CHEST FINDINGS Cardiovascular: Moderate aortic atherosclerosis. No aneurysm. Multi vessel coronary vascular calcification. Normal cardiac size. No pericardial effusion Mediastinum/Nodes: Patent trachea. No thyroid  mass. Borderline mediastinal lymph nodes as before, AP window nodes up to 11 mm. Small hiatal hernia  Lungs/Pleura: Mild diffuse septal thickening. Apical scarring. Bronchial wall thickening and persistent subpleural left greater than right consolidation as compared with CT from last month. Atelectasis or small focus of consolidation at the lingula, slightly progressive. Trace pleural effusions. Musculoskeletal: Sternum appears intact. No acute or suspicious osseous abnormality. CT ABDOMEN PELVIS FINDINGS Hepatobiliary: No focal liver abnormality is seen. No gallstones, gallbladder wall thickening, or biliary dilatation. Pancreas: Unremarkable. No pancreatic ductal dilatation or surrounding inflammatory changes. Spleen: Normal in size without focal abnormality. Adrenals/Urinary Tract: Adrenal glands are normal. Renal cysts for which no imaging follow-up is recommended. Moderate right-sided hydronephrosis to the level of the UPJ stable to slightly progressive compared with most recent prior CT. Possible mild urothelial thickening of proximal right ureter, series 2, image 91. Distended urinary bladder. Right ureter decompressed. Stomach/Bowel: Stomach is nondistended. No dilated small bowel. Negative appendix. Question mild wall thickening of ascending colon with slight mucosal enhancement, series 2, image 91 through 99. Not much surrounding stranding. Vascular/Lymphatic: Aortic atherosclerosis.  No enlarged abdominal or pelvic lymph nodes. Reproductive: Uterus and bilateral adnexa are unremarkable. Calcified uterine fibroid Other: Negative for pelvic effusion or free air Musculoskeletal: No acute or suspicious osseous abnormality. IMPRESSION: 1. Bronchial wall thickening and persistent subpleural left greater than right consolidation and trace pleural effusions as compared with CT from last month. Atelectasis or small focus of consolidation at the lingula, slightly progressive. Findings could be secondary to infection or potential aspiration. 2. Moderate right-sided hydronephrosis to the level of the UPJ, stable to  slightly progressive compared with most recent prior CT. Possible mild urothelial thickening of proximal right ureter, question mild inflammatory or infectious process. UPJ obstruction/proximal ureteral stricture also considered. Recommend urology consultation if not already performed. 3. Possible mild wall thickening of ascending colon with slight mucosal enhancement but no significant surrounding edema or inflammation. Correlate for colitis type symptoms. 4. Aortic atherosclerosis. Aortic Atherosclerosis (ICD10-I70.0). Electronically Signed   By: Luke Bun M.D.   On: 09/29/2024 15:33   CT Head Wo Contrast Result Date: 09/29/2024 CLINICAL DATA:  Headache EXAM: CT HEAD WITHOUT CONTRAST TECHNIQUE: Contiguous axial images were obtained from the base of the skull through the vertex without intravenous contrast. RADIATION DOSE REDUCTION: This exam was performed according to the departmental dose-optimization program which includes automated exposure control, adjustment of the mA and/or kV according to patient size and/or use of iterative reconstruction technique. COMPARISON:  CT brain 12/08/2021, 12/17/2023 FINDINGS: Brain: No acute territorial infarction, hemorrhage or intracranial mass.atrophy and mild chronic small vessel ischemic changes of the white matter. 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 Stable ventricle size. Vascular: No hyperdense vessels.  Carotid vascular calcification Skull: Normal. Negative for fracture or focal lesion. Sinuses/Orbits: No acute finding. Other: None IMPRESSION: 1. No CT evidence for acute intracranial abnormality. 2. Atrophy and chronic small vessel ischemic changes of the white matter. 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 Electronically Signed   By: Luke Bun M.D.   On: 09/29/2024 15:16        Scheduled Meds:  ALPRAZolam   0.5 mg Oral BID   aspirin  EC  81 mg Oral Daily   atorvastatin   80 mg Oral QPM   budesonide-glycopyrrolate-formoterol  2  puff Inhalation BID   enoxaparin  (LOVENOX ) injection  40 mg Subcutaneous Q24H   gabapentin   100 mg Oral BID   insulin  aspart  0-15 Units Subcutaneous Q4H   insulin  glargine-yfgn  26 Units Subcutaneous QHS   levothyroxine   125 mcg Oral Q0600   magnesium  oxide  400 mg Oral Daily   metoprolol  succinate  25 mg Oral Daily   mirabegron ER  25 mg Oral Daily   pantoprazole   40 mg Oral Daily   senna-docusate  1 tablet Oral BID   ticagrelor   90 mg Oral BID   Continuous Infusions:  azithromycin  500 mg (09/30/24 2113)   cefTRIAXone  (ROCEPHIN )  IV Stopped (10/01/24 0031)   metronidazole  500 mg (10/01/24 1205)          Bill Mcvey, MD [facility] 10/01/2024, 1:26 PM

## 2024-10-01 NOTE — Plan of Care (Signed)

## 2024-10-01 NOTE — Evaluation (Signed)
 Physical Therapy Evaluation Patient Details Name: Gabrielle Wood MRN: [id number] DOB: 09-14-1946 Today's Date: 10/01/2024  History of Present Illness  Gabrielle Wood is a 78 y.o. female with medical history significant for hypertension, diabetes mellitus, hypothyroidism.  Patient was brought to the ED with several complaints-headaches, generalized weakness, confused speech, lower abdominal pain, dry cough, left chest pain.   At the time of my evaluation patient is awake, alert and oriented and able to answer questions, patient's 2 sons and patient's friend at bedside.  Reports that yesterday, patient's speech was confused, her speech was not slurred-per initial ED notes.  No vomiting no diarrhea.  She has had good oral intake.  No difficulty breathing.  No dizziness.  No focal heaviness or weakness of her extremities.  She reports weakness all over.  Patient lives alone, but has family- son, that lives nearby.     Recently hospitalized 9/12 to 9/17 for sepsis secondary to pneumonia, with AKI and failure to thrive.    Clinical Impression  Pt. Presented with general weakness in LE, pt tolerated session well. Pt. Had supervision for bed mobility to EOB. During treatment use BSC w/RW for stability. Pt requires CGA w/ RW for transfer from sit to stand and ambulation, with ambulation it was slow and labored and pt. Felt fatigues towards the end. Nursing staff was notified on pt status. Patient will benefit from continued skilled physical therapy in hospital and recommended venue below to increase strength, balance, endurance for safe ADLs and gait.       If plan is discharge home, recommend the following: A little help with walking and/or transfers;A little help with bathing/dressing/bathroom;Assist for transportation;Assistance with cooking/housework   Can travel by private vehicle    No    Equipment Recommendations None recommended by PT  Recommendations for Other Services       Functional  Status Assessment Patient has had a recent decline in their functional status and demonstrates the ability to make significant improvements in function in a reasonable and predictable amount of time.     Precautions / Restrictions Precautions Precautions: Fall Recall of Precautions/Restrictions: Intact Restrictions Weight Bearing Restrictions Per Provider Order: No      Mobility  Bed Mobility Overal bed mobility: Modified Independent             General bed mobility comments: UE support, labored to EOB    Transfers Overall transfer level: Needs assistance Equipment used: Rolling walker (2 wheels) Transfers: Sit to/from Stand, Bed to chair/wheelchair/BSC Sit to Stand: Contact guard assist           General transfer comment: transfer from bed to chair with RW    Ambulation/Gait Ambulation/Gait assistance: Contact guard assist Gait Distance (Feet): 48 Feet Assistive device: Rolling walker (2 wheels) Gait Pattern/deviations: Decreased stride length, Step-to pattern, Decreased step length - right, Decreased step length - left, Decreased stance time - right, Decreased stance time - left, Narrow base of support Gait velocity: slow     General Gait Details: Pt. was able to ambulate with RW, pt. felt fatigued towards the end of ambulation, pt. had labored and slow steps  Stairs            Wheelchair Mobility     Tilt Bed    Modified Rankin (Stroke Patients Only)       Balance Overall balance assessment: Needs assistance Sitting-balance support: No upper extremity supported, Feet supported Sitting balance-Leahy Scale: Good Sitting balance - Comments: Good EOB   Standing  balance support: During functional activity, Reliant on assistive device for balance Standing balance-Leahy Scale: Fair Standing balance comment: Fair with RW                             Pertinent Vitals/Pain Pain Assessment Pain Assessment: 0-10 Pain Score: 7  Pain  Location: RLE Pain Descriptors / Indicators: Discomfort Pain Intervention(s): Monitored during session, Repositioned    Home Living Family/patient expects to be discharged to:: Private residence Living Arrangements: Alone Available Help at Discharge: Family;Available PRN/intermittently Type of Home: House Home Access: Stairs to enter Entrance Stairs-Rails: Right;Left;Can reach both Entrance Stairs-Number of Steps: 3   Home Layout: One level Home Equipment: Agricultural Consultant (2 wheels);Shower seat;Shower seat - built in;Grab bars - tub/shower;Wheelchair - manual Additional Comments: Pt. 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 ADLs Comments: A little assist for bathing, dressing, and assisted for IADL's.     Extremity/Trunk Assessment        Lower Extremity Assessment Lower Extremity Assessment: Generalized weakness    Cervical / Trunk Assessment Cervical / Trunk Assessment: Normal  Communication   Communication Communication: Impaired;No apparent difficulties Factors Affecting Communication: Hearing impaired    Cognition Arousal: Alert Behavior During Therapy: WFL for tasks assessed/performed   PT - Cognitive impairments: No apparent impairments                         Following commands: Intact       Cueing Cueing Techniques: Verbal cues, Tactile cues     General Comments      Exercises     Assessment/Plan    PT Assessment Patient needs continued PT services  PT Problem List Decreased strength;Decreased range of motion;Decreased activity tolerance;Decreased balance;Decreased mobility;Decreased coordination       PT Treatment Interventions  DME instruction;Gait training;Stair training;Functional mobility training;Therapeutic activities;Therapeutic exercise;Balance training;Patient/family education    PT Goals (Current goals can be found in the Care Plan section)  Acute Rehab PT Goals Patient Stated Goal: pt. want to return home Time For Goal Achievement: 10/09/24 Potential to Achieve Goals: Good    Frequency Min 3X/week     Co-evaluation               AM-PAC PT 6 Clicks Mobility  Outcome Measure Help needed turning from your back to your side while in a flat bed without using bedrails?: None Help needed moving from lying on your back to sitting on the side of a flat bed without using bedrails?: A Little Help needed moving to and from a bed to a chair (including a wheelchair)?: A Little Help needed standing up from a chair using your arms (e.g., wheelchair or bedside chair)?: A Little Help needed to walk in hospital room?: A Little Help needed climbing 3-5 steps with a railing? : A Lot 6 Click Score: 18    End of Session   Activity Tolerance: Patient tolerated treatment well;Patient limited by fatigue Patient left: in chair;with call bell/phone within reach Nurse Communication: Mobility status PT Visit Diagnosis: Other abnormalities of gait and mobility (R26.89);Muscle weakness (generalized) (M62.81);History of falling (Z91.81)    Time: 8579-8546 PT Time Calculation (min) (ACUTE ONLY):  33 min   Charges:   PT Evaluation $PT Eval Moderate Complexity: 1 Mod PT Treatments $Therapeutic Activity: 23-37 mins PT General Charges $$ ACUTE PT VISIT: 1 Visit         Alba Perillo, SPT

## 2024-10-01 NOTE — Plan of Care (Signed)
  Problem: Acute Rehab PT Goals(only PT should resolve) Goal: Pt Will Go Supine/Side To Sit Outcome: Progressing Flowsheets (Taken 10/01/2024 1533) Pt will go Supine/Side to Sit:  with modified independence  Independently Goal: Pt Will Go Sit To Supine/Side Outcome: Progressing Flowsheets (Taken 10/01/2024 1533) Pt will go Sit to Supine/Side:  with modified independence  Independently Goal: Patient Will Perform Sitting Balance Outcome: Progressing Flowsheets (Taken 10/01/2024 1533) Patient will perform sitting balance: with modified independence Goal: Patient Will Transfer Sit To/From Stand Outcome: Progressing Flowsheets (Taken 10/01/2024 1533) Patient will transfer sit to/from stand:  with supervision  with modified independence Goal: Pt Will Transfer Bed To Chair/Chair To Bed Outcome: Progressing Flowsheets (Taken 10/01/2024 1533) Pt will Transfer Bed to Chair/Chair to Bed:  with supervision  with modified independence Goal: Pt Will Perform Standing Balance Or Pre-Gait Outcome: Progressing Flowsheets (Taken 10/01/2024 1533) Pt will perform standing balance or pre-gait:  with Modified Independent  with Supervision Note: W/ RW Goal: Pt Will Ambulate Outcome: Progressing Flowsheets (Taken 10/01/2024 1533) Pt will Ambulate:  75 feet  with supervision Note: W/ RW     Mylea Roarty, SPT

## 2024-10-01 NOTE — Consult Note (Signed)
 Palliative Care Consult Note                                  Date: 10/01/2024   Patient Name: Gabrielle Wood  DOB: [birth year]  MRN: [id number]  Age / Sex: 78 y.o., female  PCP: [name], MD Referring Physician: Mcarthur Pick, MD  Reason for Consultation: Establishing goals of care  Past Medical History:  Diagnosis Date   Arthritis    COPD (chronic obstructive pulmonary disease) (HCC)    Diabetes mellitus    DVT (deep venous thrombosis) (HCC)    years ago   GERD (gastroesophageal reflux disease)    H. pylori infection 2013   S/p treatment with amoxicillin , Biaxin, PPI   High cholesterol    Hypertension    Hypothyroidism    NSTEMI (non-ST elevated myocardial infarction) (HCC) 12/2020   s/p  PCI DES to the LCx   Thyroid  disease     Subjective:   This NP Camellia Kays reviewed medical records, received report from team, assessed the patient and then meet at the patient's bedside to discuss diagnosis, prognosis, GOC, EOL wishes disposition and options.  Before meeting with the patient/family, I spent time reviewing the chart notes including ED provider notes, H&P, nursing notes, RT notes from 09/29/2024; TOC note from yesterday, nursing note from yesterday, hospice note from yesterday, urology note from yesterday, nursing notes from today, hospitalist note from today. I also reviewed vital signs, nursing flowsheets, medication administrations record, labs, and imaging. Labs reviewed include CBC from the past 3 days which shows admission white blood cell count of 19.7, improved to 15.3 yesterday, 4.6/normal today in the setting of pneumonia and severe sepsis with treatment.  BMP shows AKI with admission creatinine of 1.24 on admission now normalized to 0.73 today in the setting of severe sepsis with endorgan damage status post treatment.  I met with the patient at bedside, no family was present.   We meet to discuss diagnosis  prognosis, GOC, EOL wishes, disposition and options. Concept of Palliative Care was introduced as specialized medical care for people and their families living with serious illness.  If focuses on providing relief from the symptoms and stress of a serious illness.  The goal is to improve quality of life for both the patient and the family. Values and goals of care important to patient and family were attempted to be elicited.  Created space and opportunity for patient  and family to explore thoughts and feelings regarding current medical situation   Natural trajectory and current clinical status were discussed. Questions and concerns addressed. Patient  encouraged to call with questions or concerns.    Patient/Family Understanding of Illness: She is able to tell me that she came in feeling poorly.  She knows she is here for pneumonia, her heart acting up, her blood sugars are also increased.  We spent time talking about her chronic illnesses including multiple recent readmissions (5 ER visits, 10 hospital admissions amongst multiple systems in the past 6 months).  We spent time talking about her chronic illnesses, acute presentation, trajectory of illness.  Life Review: The patient had 3 sons, 1 passed away at the age of 55.  She has 2 sons currently living.  Her son Garrel lives across the street and visits daily with her until 9:30 PM.  Otherwise she lives alone.  She enjoys listening to country music and Christian music.  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  I offered chaplain consult for spiritual care and she agreed.  Baseline Status: At baseline she lives alone, although her son and daughter-in-law live across the street.  She is able to get up and ambulate with a walker, bathe, dress herself.  She is unable to cook but her daughter-in-law, son, son's friend all help provide meals and she is able to fix her meals and feed herself.  Today's Discussion: In  addition to discussions described above we had extensive discussion on various topics.  We spent a lot of time talking about her chronic illness, multiple hospital admissions, and trajectory of illness.  She understands she has severe illness and she sees palliative care in the home through [facility].  We talked about the difficulty of multiple admissions.  When asked that she is okay to continue coming back and forth to the hospital as her health continues to decline she states I am, and I am not.  I do not like to but I do not really have another option at this point.  We spent time talking about options if she got to a point where she chose to not continue coming back and forth the hospital.  If her illness got so poor, and she was tired of repeated hospital admissions and doctors visits, there is the option for comfort care and hospice care.  We spent time talking about what this looks like.  At this point she does not feel like she is ready for hospice.  However, she is agreeable to ongoing palliative care for ongoing goals of care conversations as her health evolves over time.  We spent time talk about CODE STATUS.  She was pretty clear that she would not want resuscitation and would prefer to be allowed to pass peacefully if her heart stops and she cardiac arrest.  I shared I would update her CODE STATUS to DNR/DNI.  This is consistent with her wishes during her previous hospital admission as well.  I shared that this time her goals are clear and so palliative medicine would back off.  I offered that we are available for any significant change or new needs.  She is hopeful to be able to leave the hospital soon, as she has improved significantly, and get back home to be closer to family.  I provided emotional and general support through therapeutic listening, empathy, sharing of stories, and other techniques. I answered all questions and addressed all concerns to the best of my  ability.  Goals: DNR/DNI, continue current scope of care, agreeable to continued outpatient palliative care at discharge (already established with serious illness program).[facility]   Review of Systems  Constitutional:        Denies pain in general  Respiratory:  Negative for shortness of breath.   Cardiovascular:  Negative for chest pain.  Gastrointestinal:  Negative for abdominal pain, nausea and vomiting.    Objective:   Primary Diagnoses: Present on Admission:  AKI (acute kidney injury)  CAP (community acquired pneumonia)  Benign hypertension  Hypothyroidism  Rheumatoid arthritis (HCC)   Vital Signs:  BP 131/82 (BP Location: Left Wrist)   Pulse 87   Temp 97.8 F (36.6 C) (Oral)   Resp 18   Ht 5' 7 (1.702 m)   Wt 66.7 kg   SpO2 97%   BMI 23.02 kg/m   Physical Exam Vitals and nursing note reviewed.  Constitutional:  General: She is not in acute distress.    Appearance: She is ill-appearing. She is not toxic-appearing.  HENT:     Head: Normocephalic and atraumatic.  Cardiovascular:     Rate and Rhythm: Normal rate.  Pulmonary:     Effort: Pulmonary effort is normal. No respiratory distress.     Breath sounds: No wheezing or rhonchi.  Abdominal:     General: Abdomen is flat. Bowel sounds are normal. There is no distension.     Palpations: Abdomen is soft.     Tenderness: There is no abdominal tenderness.  Skin:    General: Skin is warm and dry.  Neurological:     General: No focal deficit present.     Mental Status: She is alert and oriented to person, place, and time.  Psychiatric:        Mood and Affect: Mood normal.        Behavior: Behavior normal.     Palliative Assessment/Data: 60%   Advanced Care Planning:   Existing Vynca/ACP Documentation: None  Primary Decision Maker: PATIENT  Pertinent diagnosis: Pneumonia, severe sepsis, metabolic encephalopathy, AKI  The patient and/or family consented to a voluntary Advance Care Planning  Conversation in person. Individuals present for the conversation: The patient; Camellia Kays, NP  Summary of the conversation: We discussed CODE STATUS, severity of illness, multiple recent hospitalizations, overall projected illness trajectory, options moving forward from continued full and aggressive care versus comfort care and multiple points in between.  Outcome of the conversations and/or documents completed: Change CODE STATUS to DNR/DNI, continue current scope of care, hope to discharge home with ongoing outpatient palliative care/serious illness program.  She does not feel like she is ready for hospice at this point but knows that she can elect hospice at any point that she feels ready.  I spent 20 minutes providing separately identifiable ACP services with the patient and/or surrogate decision maker in a voluntary, in-person conversation discussing the patient's wishes and goals as detailed in the above note.  Assessment & Plan:   HPI/Patient Profile: 78 y.o. female  with past medical history of hypertension, diabetes mellitus, hypothyroidism who was brought to the ED with several complaints-headaches, generalized weakness, confused speech, lower abdominal pain, dry cough, left chest pain.  She was admitted on 09/29/2024 with pneumonia, acute metabolic encephalopathy, severe sepsis, right hydronephrosis, AKI, and others.   Palliative medicine was consulted by request of [facility], who sees her as outpatient serious illness/palliative care, based on assessment of hospice appropriate.  SUMMARY OF RECOMMENDATIONS   Changed to DNR-limited (DNR/DNI) Continue current scope of care otherwise Agreeable to continued outpatient palliative care at discharge Spiritual care consult for chaplain support Goals are clear Palliative medicine will back off as we have no coverage for the next 3 days Please notify us  Monday if significant clinical change or new palliative needs  Symptom Management:  Per  primary team Palliative medicine is available to assist as needed  Code Status: DNR - Limited (DNR/DNI)  Prognosis:  Unable to determine  Discharge Planning:  To Be Determined   Discussed with: Patient, medical team, nursing team, [facility] team    Thank you for allowing us  to participate in the care of MALEIYAH RELEFORD PMT will continue to support holistically.  Billing based on MDM: High  Problems Addressed: One acute or chronic illness or injury that poses a threat to life or bodily function  Risks: Decision not to resuscitate or to de-escalate care because of poor prognosis  Detailed review of medical records (labs, imaging, vital signs), medically appropriate exam, discussed with treatment team, counseling and education to patient, family, & staff, documenting clinical information, medication management, coordination of care  Signed by: Camellia Kays, NP Palliative Medicine Team  Team Phone # [phone] (Nights/Weekends)  10/01/2024, 1:46 PM

## 2024-10-01 NOTE — TOC Progression Note (Signed)
 Transition of Care [facility]) - Progression Note    Patient Details  Name: Gabrielle Wood MRN: [id number] Date of Birth: Feb 23, 1946  [facility] CM/SW Contact  Sharlyne Stabs, RN Phone Number: 10/01/2024, 1:52 PM  Clinical Narrative:   [facility] contacted [facility] that patient maybe hospice appropriate and requested a IP Palliative consult.  MD updated and order placed. Palliative will put in a note. Plan is to continue out patient palliative with [facility]. [facility] following.     Expected Discharge Plan: Home w Home Health Services Barriers to Discharge: Continued Medical Work up      Expected Discharge Plan and Services In-house Referral: Clinical Social Work Discharge Planning Services: NA Post Acute Care Choice: Home Health, Resumption of Svcs/PTA Provider Living arrangements for the past 2 months: Single Family Home                 DME Arranged: N/A DME Agency: NA    Social Drivers of Health (SDOH) Interventions SDOH Screenings   Food Insecurity: No Food Insecurity (09/29/2024)  Housing: Low Risk  (09/29/2024)  Transportation Needs: No Transportation Needs (09/29/2024)  Utilities: Not At Risk (09/29/2024)  Depression (PHQ2-9): High Risk (11/01/2021)  Financial Resource Strain: Low Risk  (08/11/2024)   Received from [facility]  Physical Activity: Inactive (08/11/2024)   Received from [facility]  Social Connections: Socially Isolated (09/29/2024)  Stress: No Stress Concern Present (08/11/2024)   Received from [facility]  Tobacco Use: Medium Risk (09/29/2024)  Health Literacy: Medium Risk (08/11/2024)   Received from [facility]    Readmission Risk Interventions    08/19/2024    9:21 AM 08/17/2024    2:30 PM  Readmission Risk Prevention Plan  Post Dischage Appt Complete   Medication Screening Complete Complete  Transportation Screening Complete Complete

## 2024-10-01 NOTE — Progress Notes (Signed)
 SPIRITUAL CARE AND COUNSELING CONSULT NOTE   VISIT SUMMARY   Reason for Visit: Chaplain responding to referral/Spiritual Consult from Palliative Care Team suggesting Pt  could use an extra layer of support  Description of Visit: Upon entering the room I found Gabrielle Wood in the recliner with no support person present.    I introduced myself as the chaplain and asked if she was willing to speak with me.  Jalaysha was very kind and open with me as I explored her spiritual, emotional, and relational needs and resources.  Emotionally and Relationally I see that Shabreka has what she needs for support and has good people around her providing care.  I am concerned that, however, that Rogenia may be struggling with a lack of purpose.  I explored with her what her days look like and she does not report any hobbies or self-care rituals that exist in her day.  She reports a life of sitting alone and waiting on others to visit.  It would be helpful to her to find something enjoyable and productive to occupy her time.    Plan of Care: I will plan to follow up with this Pt until discharge   SPIRITUAL ENCOUNTER                                                                                                                                                                      Type of Visit: Initial Care provided to:: Patient Referral source: Other (comment) (Palliative Provider) Reason for visit: Routine spiritual support OnCall Visit: No   SPIRITUAL FRAMEWORK  Presenting Themes: Meaning/purpose/sources of inspiration, Caregiving needs, Goals in life/care, Values and beliefs Values/beliefs: Dashae states I love the [place], I have served him since I was a child Community/Connection: Family, Faith community Strengths: Olita is very Kind and Honest Needs/Challenges/Barriers: Caring for herself is difficult, and she is gorwing weary of repeated hospital stays. Patient Stress Factors: Major life changes Family Stress  Factors: Exhausted   GOALS   Self/Personal Goals: Right now Pt wnats to go home and not have to come back to hospital Clinical Care Goals: Help Promote a sense of peace and support Pt's decision making   INTERVENTIONS   Spiritual Care Interventions Made: Established relationship of care and support, Compassionate presence, Narrative/life review, Explored values/beliefs/practices/strengths    INTERVENTION OUTCOMES   Outcomes: Connection to spiritual care, Awareness of support    Maude Roll, MDiv Chaplain, [facility] [email or web address] [phone]  10/01/2024 2:23 PM

## 2024-10-02 DIAGNOSIS — J189 Pneumonia, unspecified organism: Secondary | ICD-10-CM | POA: Diagnosis not present

## 2024-10-02 LAB — GLUCOSE, CAPILLARY
Glucose-Capillary: 154 mg/dL — ABNORMAL HIGH (ref 70–99)
Glucose-Capillary: 95 mg/dL (ref 70–99)
Glucose-Capillary: 280 mg/dL — ABNORMAL HIGH (ref 70–99)

## 2024-10-02 MED ORDER — SENNOSIDES-DOCUSATE SODIUM 8.6-50 MG PO TABS: 1.0000 | ORAL_TABLET | Freq: Every evening | ORAL | 0 refills | Status: AC | PRN

## 2024-10-02 MED ORDER — AMOXICILLIN-POT CLAVULANATE 875-125 MG PO TABS: 1.0000 | ORAL_TABLET | Freq: Two times a day (BID) | ORAL | 0 refills | Status: DC

## 2024-10-02 MED ORDER — ASPIRIN EC 81 MG PO TBEC: 81.0000 mg | DELAYED_RELEASE_TABLET | Freq: Every day | ORAL | 11 refills | Status: AC

## 2024-10-02 NOTE — Discharge Summary (Signed)
 Physician Discharge Summary  Gabrielle Wood DOB: Apr 29, 1946 DOA: 09/29/2024  PCP: [name], MD  Admit date: 09/29/2024 Discharge date: 10/02/2024  Admitted From: Home Disposition: Home  Recommendations for Outpatient Follow-up:  Follow up with PCP in 1 week with repeat CBC/BMP Follow-up with urology regarding chronic UPJ obstruction. Follow up in ED if symptoms worsen or new appear   Home Health: yes Equipment/Devices: None  Discharge Condition: Stable CODE STATUS: dnr Diet recommendation: Heart healthy  Brief/Interim Summary:  Gabrielle Wood is a 78 y.o. female with medical history significant for hypertension, diabetes mellitus, hypothyroidism. Patient was brought to the ED with several complaints-headaches, generalized weakness, confused speech, lower abdominal pain, dry cough, left chest pain.    Has had recurrent pneumonia with recent hospitalization 9/12 to 9/17 for sepsis secondary to pneumonia, with AKI and failure to thrive.   ED Course: Temperature 97.5.  Heart rate 78-90.  Respirate rate 17-36.  Blood pressure systolic 82-169.  O2 sats 90 to 94% on room air. WBC 19.7.  Lactic acid 1.7.  Troponin 19.  COVID influenza RSV negative. Creatinine elevated 1.24. CT chest finding suggestive of infection of potential aspiration, also moderate right-sided hydronephrosis to the level of UPJ slightly progressed compared to prior CT.  Mild ascending colonic wall thickening-correlate for colitis type symptoms.  Patient admitted due to concern for pneumonia, colitis and UTI along with acute encephalopathy.  Patient treated with with IV ceftriaxone , Flagyl , azithromycin  with slow improvement.  UA was not suggestive of UTI but CT abdomen possible mild urothelial thickening of proximal right ureter, question mild inflammatory or infectious process.  - Blood cultures negative for 48 hours, urine culture with mixed growth likely contamination.    She was  discharged home with prescription of Augmentin  to complete about 7 days course.   Right hydronephrosis-present previously, now worsening.  CT today - moderate right-sided hydronephrosis to the level of the UPJ, stable to slightly progressive compared with most recent prior CT. Possible mild urothelial thickening of proximal right ureter, question mild inflammatory or infectious process. UPJ obstruction/proximal ureteral stricture also considered. - Urology consult appreciated.  Currently no indication for inpatient intervention.  Will need to follow-up outpatient.   AKI-creatinine 1.24 has normalized to 0.73.  Labs from today reviewed.   Hypertension-continue metoprolol , valsartan  Uncontrolled diabetes mellitus with hyperglycemia-glucose 425. A1c 9.7. - SSI- M - Resume long-acting insulin  26 units daily   Coronary artery disease- s/p PCI with stent - Continue Brilinta , aspirin , atorvastatin   Patient is discharged home with home health care.   Discharge Diagnoses:  Principal Problem:   CAP (community acquired pneumonia) Active Problems:   AKI (acute kidney injury)   Benign hypertension   Hypothyroidism   Rheumatoid arthritis (HCC)   Diabetes mellitus (HCC)   UPJ obstruction, congenital    Discharge Instructions  Discharge Instructions     Call MD for:  persistant nausea and vomiting   Complete by: As directed    Call MD for:  severe uncontrolled pain   Complete by: As directed    Call MD for:  temperature >100.4   Complete by: As directed    Diet - low sodium heart healthy   Complete by: As directed    Discharge instructions   Complete by: As directed    1.  Continue Augmentin  for 3 days after discharge for pneumonia/colitis/UTI 2.  Continue to take medications that you have been taking prior to admissions.   Increase activity slowly   Complete  by: As directed       Allergies as of 10/02/2024       Reactions   Amlodipine  Other (See Comments)   Unknown     Ciprocin-fluocin-procin [fluocinolone] Other (See Comments)   Nightmares   Lipitor [atorvastatin ] Other (See Comments)   Muscle aches, pain   Nebivolol Other (See Comments)   Unknown    Nitrofuran Derivatives Other (See Comments)   Unknown    Lisinopril Cough   Red Yeast Rice [cholestin] Itching, Rash        Medication List     STOP taking these medications    cefUROXime 500 MG tablet Commonly known as: CEFTIN       TAKE these medications    acetaminophen  500 MG tablet Commonly known as: TYLENOL  Take 1,000 mg by mouth in the morning and at bedtime.   albuterol  108 (90 Base) MCG/ACT inhaler Commonly known as: VENTOLIN  HFA Inhale 1-2 puffs into the lungs every 4 (four) hours as needed for wheezing or shortness of breath.   albuterol  (2.5 MG/3ML) 0.083% nebulizer solution Commonly known as: PROVENTIL  Take 3 mLs (2.5 mg total) by nebulization every 6 (six) hours as needed for wheezing or shortness of breath.   ALPRAZolam  1 MG tablet Commonly known as: XANAX  Take 0.5 tablets (0.5 mg total) by mouth at bedtime. 0.5 tablet in am and 0.5 tab at night What changed: additional instructions   amoxicillin -clavulanate 875-125 MG tablet Commonly known as: AUGMENTIN  Take 1 tablet by mouth 2 (two) times daily.   aspirin  EC 81 MG tablet Take 1 tablet (81 mg total) by mouth daily. Swallow whole.   atorvastatin  80 MG tablet Commonly known as: LIPITOR Take 80 mg by mouth every evening.   baclofen 10 MG tablet Commonly known as: LIORESAL Take 10 mg by mouth 2 (two) times daily as needed for muscle spasms.   Brilinta  60 MG Tabs tablet Generic drug: ticagrelor  Take 60 mg by mouth 2 (two) times daily.   FreeStyle Libre 3 Plus Sensor Misc Change sensor every 15 days.   gabapentin  100 MG capsule Commonly known as: NEURONTIN  Take 1 capsule (100 mg total) by mouth 3 (three) times daily.   insulin  lispro 100 UNIT/ML KwikPen Commonly known as: HumaLOG  KwikPen Inject 8-14  Units into the skin 3 (three) times daily. What changed: how much to take   levothyroxine  125 MCG tablet Commonly known as: SYNTHROID  Take 1 tablet (125 mcg total) by mouth daily.   magnesium  oxide 400 (240 Mg) MG tablet Commonly known as: MAG-OX Take 400 mg by mouth daily.   metoprolol  succinate 25 MG 24 hr tablet Commonly known as: TOPROL -XL Take 25 mg by mouth daily.   Myrbetriq 25 MG Tb24 tablet Generic drug: mirabegron ER Take 25 mg by mouth daily.   nitrofurantoin (macrocrystal-monohydrate) 100 MG capsule Commonly known as: MACROBID Take 100 mg by mouth daily.   nitroGLYCERIN 0.4 MG SL tablet Commonly known as: NITROSTAT Place 0.4 mg under the tongue every 5 (five) minutes as needed for chest pain.   OXYGEN  Inhale 2 L/min into the lungs as needed (shortness of breath, low O2 sats).   pantoprazole  40 MG tablet Commonly known as: PROTONIX  Take 40 mg by mouth daily.   senna-docusate 8.6-50 MG tablet Commonly known as: Senokot-S Take 1 tablet by mouth at bedtime as needed for mild constipation.   Trelegy Ellipta 100-62.5-25 MCG/ACT Aepb Generic drug: Fluticasone-Umeclidin-Vilant Inhale 1 puff into the lungs daily.   Tresiba  FlexTouch 100 UNIT/ML FlexTouch Pen Generic  drug: insulin  degludec Inject 26 Units into the skin at bedtime. What changed: how much to take   valsartan 40 MG tablet Commonly known as: DIOVAN Take 40 mg by mouth 2 (two) times daily.        Contact information for follow-up providers     Vyas, Leta B, MD. Schedule an appointment as soon as possible for a visit in 1 week(s).   Specialty: Internal Medicine Contact information: [address] [place] [address] [phone]              Contact information for after-discharge care     Home Medical Care     [facility]) Follow up.   Service: Home Health Services Why: HHPT will call to start services Contact information: [address] [place]  [place] [address] [phone]                    Allergies  Allergen Reactions   Amlodipine  Other (See Comments)    Unknown    Ciprocin-Fluocin-Procin [Fluocinolone] Other (See Comments)    Nightmares   Lipitor [Atorvastatin ] Other (See Comments)    Muscle aches, pain   Nebivolol Other (See Comments)    Unknown    Nitrofuran Derivatives Other (See Comments)    Unknown    Lisinopril Cough   Red Yeast Rice [Cholestin] Itching and Rash    Consultations:    Procedures/Studies: CT CHEST ABDOMEN PELVIS W CONTRAST Result Date: 09/29/2024 CLINICAL DATA:  Left-sided chest pain right lower quadrant abdominal pain EXAM: CT CHEST, ABDOMEN, AND PELVIS WITH CONTRAST TECHNIQUE: Multidetector CT imaging of the chest, abdomen and pelvis was performed following the standard protocol during bolus administration of intravenous contrast. RADIATION DOSE REDUCTION: This exam was performed according to the departmental dose-optimization program which includes automated exposure control, adjustment of the mA and/or kV according to patient size and/or use of iterative reconstruction technique. CONTRAST:  80mL OMNIPAQUE  IOHEXOL  300 MG/ML  SOLN COMPARISON:  CT 08/15/2024, 01/06/2024, CT 11/04/2022, 07/05/2022, 10/14/2021 FINDINGS: CT CHEST FINDINGS Cardiovascular: Moderate aortic atherosclerosis. No aneurysm. Multi vessel coronary vascular calcification. Normal cardiac size. No pericardial effusion Mediastinum/Nodes: Patent trachea. No thyroid  mass. Borderline mediastinal lymph nodes as before, AP window nodes up to 11 mm. Small hiatal hernia Lungs/Pleura: Mild diffuse septal thickening. Apical scarring. Bronchial wall thickening and persistent subpleural left greater than right consolidation as compared with CT from last month. Atelectasis or small focus of consolidation at the lingula, slightly progressive. Trace pleural effusions. Musculoskeletal: Sternum appears intact. No acute or suspicious osseous  abnormality. CT ABDOMEN PELVIS FINDINGS Hepatobiliary: No focal liver abnormality is seen. No gallstones, gallbladder wall thickening, or biliary dilatation. Pancreas: Unremarkable. No pancreatic ductal dilatation or surrounding inflammatory changes. Spleen: Normal in size without focal abnormality. Adrenals/Urinary Tract: Adrenal glands are normal. Renal cysts for which no imaging follow-up is recommended. Moderate right-sided hydronephrosis to the level of the UPJ stable to slightly progressive compared with most recent prior CT. Possible mild urothelial thickening of proximal right ureter, series 2, image 91. Distended urinary bladder. Right ureter decompressed. Stomach/Bowel: Stomach is nondistended. No dilated small bowel. Negative appendix. Question mild wall thickening of ascending colon with slight mucosal enhancement, series 2, image 91 through 99. Not much surrounding stranding. Vascular/Lymphatic: Aortic atherosclerosis. No enlarged abdominal or pelvic lymph nodes. Reproductive: Uterus and bilateral adnexa are unremarkable. Calcified uterine fibroid Other: Negative for pelvic effusion or free air Musculoskeletal: No acute or suspicious osseous abnormality. IMPRESSION: 1. Bronchial  wall thickening and persistent subpleural left greater than right consolidation and trace pleural effusions as compared with CT from last month. Atelectasis or small focus of consolidation at the lingula, slightly progressive. Findings could be secondary to infection or potential aspiration. 2. Moderate right-sided hydronephrosis to the level of the UPJ, stable to slightly progressive compared with most recent prior CT. Possible mild urothelial thickening of proximal right ureter, question mild inflammatory or infectious process. UPJ obstruction/proximal ureteral stricture also considered. Recommend urology consultation if not already performed. 3. Possible mild wall thickening of ascending colon with slight mucosal enhancement  but no significant surrounding edema or inflammation. Correlate for colitis type symptoms. 4. Aortic atherosclerosis. Aortic Atherosclerosis (ICD10-I70.0). Electronically Signed   By: Luke Bun M.D.   On: 09/29/2024 15:33   CT Head Wo Contrast Result Date: 09/29/2024 CLINICAL DATA:  Headache EXAM: CT HEAD WITHOUT CONTRAST TECHNIQUE: Contiguous axial images were obtained from the base of the skull through the vertex without intravenous contrast. RADIATION DOSE REDUCTION: This exam was performed according to the departmental dose-optimization program which includes automated exposure control, adjustment of the mA and/or kV according to patient size and/or use of iterative reconstruction technique. COMPARISON:  CT brain 12/08/2021, 12/17/2023 FINDINGS: Brain: No acute territorial infarction, hemorrhage or intracranial mass.atrophy and mild chronic small vessel ischemic changes of the white matter. 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 Stable ventricle size. Vascular: No hyperdense vessels.  Carotid vascular calcification Skull: Normal. Negative for fracture or focal lesion. Sinuses/Orbits: No acute finding. Other: None IMPRESSION: 1. No CT evidence for acute intracranial abnormality. 2. Atrophy and chronic small vessel ischemic changes of the white matter. 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 Electronically Signed   By: Luke Bun M.D.   On: 09/29/2024 15:16   DG Chest Port 1 View Result Date: 09/29/2024 CLINICAL DATA:  Weakness EXAM: PORTABLE CHEST 1 VIEW COMPARISON:  Chest radiograph dated 08/14/2024 FINDINGS: Normal lung volumes. Patchy left basilar opacity. Unchanged blunting of the left costophrenic angle. No pneumothorax. The heart size and mediastinal contours are within normal limits. No acute osseous abnormality. IMPRESSION: 1. Patchy left basilar opacity, likely atelectasis. 2. Unchanged blunting of the left costophrenic angle, which may represent a small pleural effusion.  Electronically Signed   By: Limin  Xu M.D.   On: 09/29/2024 13:13      Subjective:   Discharge Exam: Vitals:   10/01/24 2031 10/02/24 0500  BP:  (!) 155/80  Pulse:  71  Resp:  17  Temp:  98.3 F (36.8 C)  SpO2: 95% 100%    General: Pt is alert, awake, not in acute distress Cardiovascular: rate controlled, S1/S2 + Respiratory: bilateral decreased breath sounds at bases Abdominal: Soft, NT, ND, bowel sounds + Extremities: no edema, no cyanosis    The results of significant diagnostics from this hospitalization (including imaging, microbiology, ancillary and laboratory) are listed below for reference.     Microbiology: Recent Results (from the past 240 hours)  Resp panel by RT-PCR (RSV, Flu A&B, Covid) Anterior Nasal Swab     Status: None   Collection Time: 09/29/24 12:49 PM   Specimen: Anterior Nasal Swab  Result Value Ref Range Status   SARS Coronavirus 2 by RT PCR NEGATIVE NEGATIVE Final    Comment: (NOTE) SARS-CoV-2 target nucleic acids are NOT DETECTED.  The SARS-CoV-2 RNA is generally detectable in upper respiratory specimens during the acute phase of infection. The lowest concentration of SARS-CoV-2 viral copies this assay can detect is 138 copies/mL.  A negative result does not preclude SARS-Cov-2 infection and should not be used as the sole basis for treatment or other patient management decisions. A negative result may occur with  improper specimen collection/handling, submission of specimen other than nasopharyngeal swab, presence of viral mutation(s) within the areas targeted by this assay, and inadequate number of viral copies(<138 copies/mL). A negative result must be combined with clinical observations, patient history, and epidemiological information. The expected result is Negative.  Fact Sheet for Patients:  bloggercourse.com  Fact Sheet for Healthcare Providers:  seriousbroker.it  This test is  no t yet approved or cleared by the [place]  FDA and  has been authorized for detection and/or diagnosis of SARS-CoV-2 by FDA under an Emergency Use Authorization (EUA). This EUA will remain  in effect (meaning this test can be used) for the duration of the COVID-19 declaration under Section 564(b)(1) of the Act, 21 U.S.C.section 360bbb-3(b)(1), unless the authorization is terminated  or revoked sooner.       Influenza A by PCR NEGATIVE NEGATIVE Final   Influenza B by PCR NEGATIVE NEGATIVE Final    Comment: (NOTE) The Xpert Xpress SARS-CoV-2/FLU/RSV plus assay is intended as an aid in the diagnosis of influenza from Nasopharyngeal swab specimens and should not be used as a sole basis for treatment. Nasal washings and aspirates are unacceptable for Xpert Xpress SARS-CoV-2/FLU/RSV testing.  Fact Sheet for Patients: bloggercourse.com  Fact Sheet for Healthcare Providers: seriousbroker.it  This test is not yet approved or cleared by the [place]  FDA and has been authorized for detection and/or diagnosis of SARS-CoV-2 by FDA under an Emergency Use Authorization (EUA). This EUA will remain in effect (meaning this test can be used) for the duration of the COVID-19 declaration under Section 564(b)(1) of the Act, 21 U.S.C. section 360bbb-3(b)(1), unless the authorization is terminated or revoked.     Resp Syncytial Virus by PCR NEGATIVE NEGATIVE Final    Comment: (NOTE) Fact Sheet for Patients: bloggercourse.com  Fact Sheet for Healthcare Providers: seriousbroker.it  This test is not yet approved or cleared by the [place]  FDA and has been authorized for detection and/or diagnosis of SARS-CoV-2 by FDA under an Emergency Use Authorization (EUA). This EUA will remain in effect (meaning this test can be used) for the duration of the COVID-19 declaration under Section  564(b)(1) of the Act, 21 U.S.C. section 360bbb-3(b)(1), unless the authorization is terminated or revoked.  Performed at [facility], [address] [place], [place] [address]   Culture, blood (routine x 2)     Status: None (Preliminary result)   Collection Time: 09/29/24 12:50 PM   Specimen: BLOOD  Result Value Ref Range Status   Specimen Description BLOOD BLOOD LEFT FOREARM  Final   Special Requests   Final    BOTTLES DRAWN AEROBIC AND ANAEROBIC Blood Culture adequate volume   Culture   Final    NO GROWTH 3 DAYS Performed at [facility], [address] [place], [place] [address]    Report Status PENDING  Incomplete  Culture, blood (routine x 2)     Status: None (Preliminary result)   Collection Time: 09/29/24 12:55 PM   Specimen: BLOOD  Result Value Ref Range Status   Specimen Description BLOOD RIGHT ANTECUBITAL  Final   Special Requests   Final    BOTTLES DRAWN AEROBIC AND ANAEROBIC Blood Culture adequate volume   Culture   Final    NO GROWTH 3 DAYS Performed at [facility], [address], [place],  [place] [address]    Report Status PENDING  Incomplete  Urine Culture (for pregnant, neutropenic or urologic patients or patients with an indwelling urinary catheter)     Status: Abnormal   Collection Time: 09/29/24  5:16 PM   Specimen: Urine, Clean Catch  Result Value Ref Range Status   Specimen Description   Final    URINE, CLEAN CATCH Performed at [facility], [address] [place], [place] [address]    Special Requests   Final    NONE Performed at [facility], [address], [place], [place] [address]    Culture MULTIPLE SPECIES PRESENT, SUGGEST RECOLLECTION (A)  Final   Report Status 10/01/2024 FINAL  Final     Labs: BNP (last 3 results) No results for input(s): BNP in the last 8760 hours. Basic Metabolic Panel: Recent Labs  Lab 09/29/24 1249 09/30/24 0456 10/01/24 0913  NA 136 137 138  K 3.8 3.2* 3.9  CL 101 103 104  CO2 23 23 24   GLUCOSE 270*  186* 262*  BUN 29* 22 16  CREATININE 1.24* 0.88 0.73  CALCIUM  9.7 9.4 9.4   Liver Function Tests: Recent Labs  Lab 09/29/24 1249  AST 17  ALT 13  ALKPHOS 56  BILITOT 0.6  PROT 7.4  ALBUMIN 3.8   No results for input(s): LIPASE, AMYLASE in the last 168 hours. No results for input(s): AMMONIA in the last 168 hours. CBC: Recent Labs  Lab 09/29/24 1249 09/30/24 0456 10/01/24 0913  WBC 19.7* 15.3* 4.6  NEUTROABS  --   --  3.0  HGB 11.2* 10.3* 9.5*  HCT 34.2* 31.2* 30.0*  MCV 85.7 84.6 85.7  PLT 182 158 150   Cardiac Enzymes: No results for input(s): CKTOTAL, CKMB, CKMBINDEX, TROPONINI in the last 168 hours. BNP: Invalid input(s): POCBNP CBG: Recent Labs  Lab 10/01/24 1934 10/01/24 2333 10/02/24 0413 10/02/24 0722 10/02/24 1109  GLUCAP 271* 228* 154* 95 280*   D-Dimer No results for input(s): DDIMER in the last 72 hours. Hgb A1c No results for input(s): HGBA1C in the last 72 hours. Lipid Profile No results for input(s): CHOL, HDL, LDLCALC, TRIG, CHOLHDL, LDLDIRECT in the last 72 hours. Thyroid  function studies No results for input(s): TSH, T4TOTAL, T3FREE, THYROIDAB in the last 72 hours.  Invalid input(s): FREET3 Anemia work up No results for input(s): VITAMINB12, FOLATE, FERRITIN, TIBC, IRON, RETICCTPCT in the last 72 hours. Urinalysis    Component Value Date/Time   COLORURINE YELLOW 09/29/2024 1716   APPEARANCEUR CLEAR 09/29/2024 1716   APPEARANCEUR Clear 10/02/2021 1400   LABSPEC 1.014 09/29/2024 1716   PHURINE 6.0 09/29/2024 1716   GLUCOSEU >=500 (A) 09/29/2024 1716   HGBUR SMALL (A) 09/29/2024 1716   BILIRUBINUR NEGATIVE 09/29/2024 1716   BILIRUBINUR Negative 10/02/2021 1400   KETONESUR NEGATIVE 09/29/2024 1716   PROTEINUR NEGATIVE 09/29/2024 1716   UROBILINOGEN 0.2 04/03/2012 2144   NITRITE NEGATIVE 09/29/2024 1716   LEUKOCYTESUR NEGATIVE 09/29/2024 1716   Sepsis Labs Recent Labs  Lab  09/29/24 1249 09/30/24 0456 10/01/24 0913  WBC 19.7* 15.3* 4.6   Microbiology Recent Results (from the past 240 hours)  Resp panel by RT-PCR (RSV, Flu A&B, Covid) Anterior Nasal Swab     Status: None   Collection Time: 09/29/24 12:49 PM   Specimen: Anterior Nasal Swab  Result Value Ref Range Status   SARS Coronavirus 2 by RT PCR NEGATIVE NEGATIVE Final    Comment: (NOTE) SARS-CoV-2 target nucleic acids are NOT DETECTED.  The SARS-CoV-2 RNA is  generally detectable in upper respiratory specimens during the acute phase of infection. The lowest concentration of SARS-CoV-2 viral copies this assay can detect is 138 copies/mL. A negative result does not preclude SARS-Cov-2 infection and should not be used as the sole basis for treatment or other patient management decisions. A negative result may occur with  improper specimen collection/handling, submission of specimen other than nasopharyngeal swab, presence of viral mutation(s) within the areas targeted by this assay, and inadequate number of viral copies(<138 copies/mL). A negative result must be combined with clinical observations, patient history, and epidemiological information. The expected result is Negative.  Fact Sheet for Patients:  bloggercourse.com  Fact Sheet for Healthcare Providers:  seriousbroker.it  This test is no t yet approved or cleared by the [place]  FDA and  has been authorized for detection and/or diagnosis of SARS-CoV-2 by FDA under an Emergency Use Authorization (EUA). This EUA will remain  in effect (meaning this test can be used) for the duration of the COVID-19 declaration under Section 564(b)(1) of the Act, 21 U.S.C.section 360bbb-3(b)(1), unless the authorization is terminated  or revoked sooner.       Influenza A by PCR NEGATIVE NEGATIVE Final   Influenza B by PCR NEGATIVE NEGATIVE Final    Comment: (NOTE) The Xpert Xpress  SARS-CoV-2/FLU/RSV plus assay is intended as an aid in the diagnosis of influenza from Nasopharyngeal swab specimens and should not be used as a sole basis for treatment. Nasal washings and aspirates are unacceptable for Xpert Xpress SARS-CoV-2/FLU/RSV testing.  Fact Sheet for Patients: bloggercourse.com  Fact Sheet for Healthcare Providers: seriousbroker.it  This test is not yet approved or cleared by the [place]  FDA and has been authorized for detection and/or diagnosis of SARS-CoV-2 by FDA under an Emergency Use Authorization (EUA). This EUA will remain in effect (meaning this test can be used) for the duration of the COVID-19 declaration under Section 564(b)(1) of the Act, 21 U.S.C. section 360bbb-3(b)(1), unless the authorization is terminated or revoked.     Resp Syncytial Virus by PCR NEGATIVE NEGATIVE Final    Comment: (NOTE) Fact Sheet for Patients: bloggercourse.com  Fact Sheet for Healthcare Providers: seriousbroker.it  This test is not yet approved or cleared by the [place]  FDA and has been authorized for detection and/or diagnosis of SARS-CoV-2 by FDA under an Emergency Use Authorization (EUA). This EUA will remain in effect (meaning this test can be used) for the duration of the COVID-19 declaration under Section 564(b)(1) of the Act, 21 U.S.C. section 360bbb-3(b)(1), unless the authorization is terminated or revoked.  Performed at [facility], [address] [place], [place] [address]   Culture, blood (routine x 2)     Status: None (Preliminary result)   Collection Time: 09/29/24 12:50 PM   Specimen: BLOOD  Result Value Ref Range Status   Specimen Description BLOOD BLOOD LEFT FOREARM  Final   Special Requests   Final    BOTTLES DRAWN AEROBIC AND ANAEROBIC Blood Culture adequate volume   Culture   Final    NO GROWTH 3 DAYS Performed at [facility], [address], [place], [place] [address]    Report Status PENDING  Incomplete  Culture, blood (routine x 2)     Status: None (Preliminary result)   Collection Time: 09/29/24 12:55 PM   Specimen: BLOOD  Result Value Ref Range Status   Specimen Description BLOOD RIGHT ANTECUBITAL  Final   Special Requests   Final    BOTTLES DRAWN AEROBIC AND ANAEROBIC  Blood Culture adequate volume   Culture   Final    NO GROWTH 3 DAYS Performed at [facility], [address], [place], [place] [address]    Report Status PENDING  Incomplete  Urine Culture (for pregnant, neutropenic or urologic patients or patients with an indwelling urinary catheter)     Status: Abnormal   Collection Time: 09/29/24  5:16 PM   Specimen: Urine, Clean Catch  Result Value Ref Range Status   Specimen Description   Final    URINE, CLEAN CATCH Performed at [facility], [address], [place], [place] [address]    Special Requests   Final    NONE Performed at [facility], [address] [place], [place] [address]    Culture MULTIPLE SPECIES PRESENT, SUGGEST RECOLLECTION (A)  Final   Report Status 10/01/2024 FINAL  Final     Time coordinating discharge: 35 minutes  SIGNED:   Derryl Duval, MD  [facility] 10/02/2024, 4:29 PM

## 2024-10-02 NOTE — Care Management Important Message (Signed)
 Important Message  Patient Details  Name: Gabrielle Wood MRN: [id number] Date of Birth: [birth year]   Important Message Given:  Yes - Medicare IM     [name] Age 78/31/2025, 11:00 AM

## 2024-10-02 NOTE — Plan of Care (Signed)

## 2024-10-02 NOTE — TOC Transition Note (Signed)
 Transition of Care [facility]) - Discharge Note   Patient Details  Name: Gabrielle Wood MRN: [id number] Date of Birth: 05/28/1946  Transition of Care [facility]) CM/SW Contact:  Gabrielle Wood Phone Number: 10/02/2024, 10:58 AM   Clinical Narrative:     Patient is up for DC today. CSW spoke with son Gabrielle Wood about DC, outpatient palliative care following, and HHPT with [facility]. Son stated that his friend Gabrielle helps him out and will come pick patient up around 12:30 pm. [facility] made aware of DC and asked MD to place resumption orders.  CSW signing out.    Final next level of care: Home w Home Health Services Barriers to Discharge: Continued Medical Work up   Patient Goals and CMS Choice Patient states their goals for this hospitalization and ongoing recovery are:: return back home CMS Medicare.gov Compare Post Acute Care list provided to:: Patient Represenative (must comment) (SonGLENWOOD Gabrielle Wood) Choice offered to / list presented to : Adult Children      Discharge Placement                Patient to be transferred to facility by: Son - Friend Name of family member notified: Gabrielle Wood Patient and family notified of of transfer: 10/02/24  Discharge Plan and Services Additional resources added to the After Visit Summary for   In-house Referral: Clinical Social Work Discharge Planning Services: NA Post Acute Care Choice: Home Health, Resumption of Svcs/PTA Provider          DME Arranged: N/A DME Agency: NA       HH Arranged: PT HH Agency: [facility] Date HH Agency Contacted: 10/02/24 Time HH Agency Contacted: 1058 Representative spoke with at [facility] Agency: Scientist, Product/process Development  Social Drivers of Health (SDOH) Interventions SDOH Screenings   Food Insecurity: No Food Insecurity (09/29/2024)  Housing: Low Risk  (09/29/2024)  Transportation Needs: No Transportation Needs (09/29/2024)  Utilities: Not At Risk (09/29/2024)  Depression (PHQ2-9): High Risk (11/01/2021)  Financial Resource  Strain: Low Risk  (08/11/2024)   Received from [facility]  Physical Activity: Inactive (08/11/2024)   Received from [facility]  Social Connections: Socially Isolated (09/29/2024)  Stress: No Stress Concern Present (08/11/2024)   Received from [facility]  Tobacco Use: Medium Risk (09/29/2024)  Health Literacy: Medium Risk (08/11/2024)   Received from [facility]     Readmission Risk Interventions    10/02/2024   10:53 AM 08/19/2024    9:21 AM 08/17/2024    2:30 PM  Readmission Risk Prevention Plan  Post Dischage Appt  Complete   Medication Screening  Complete Complete  Transportation Screening Complete Complete Complete  Home Care Screening Complete    Medication Review (RN CM) Complete

## 2024-10-02 NOTE — Inpatient Diabetes Management (Signed)
 Inpatient Diabetes Program Recommendations  AACE/ADA: New Consensus Statement on Inpatient Glycemic Control (2015)  Target Ranges:  Prepandial:   less than 140 mg/dL      Peak postprandial:   less than 180 mg/dL (1-2 hours)      Critically ill patients:  140 - 180 mg/dL   Lab Results  Component Value Date   GLUCAP 95 10/02/2024   HGBA1C 9.7 (H) 08/16/2024    Review of Glycemic Control  Latest Reference Range & Units 10/01/24 07:52 10/01/24 11:27 10/01/24 15:57 10/01/24 19:34 10/01/24 23:33 10/02/24 04:13 10/02/24 07:22  Glucose-Capillary 70 - 99 mg/dL 822 (H) 674 (H) 601 (H) 271 (H) 228 (H) 154 (H) 95   Diabetes history: DM  Outpatient Diabetes medications:  FSL3 Tresiba  26-32 units q HS Humalog  8-15 units tid with meals  Current orders for Inpatient glycemic control:  Novolog  0-15 units q 4 hours Semglee  26 units q HS  Inpatient Diabetes Program Recommendations:   Consider changing Novolog  correction to tid with meals and HS scale.  Also please consider adding Novolog  meal coverage 4 units tid with meals (hold if patient eats less than 50% or NPO).   Thanks,  Randall Bullocks, RN, BC-ADM Inpatient Diabetes Coordinator Pager [phone]  (8a-5p)

## 2024-10-02 NOTE — Progress Notes (Signed)
 [id number] Patient eating breakfast unavailable for administration of Breztri DPI.

## 2024-10-02 NOTE — Progress Notes (Signed)
 IV's removed and DC instructions reviewed.  To call primary for follow up appt and other appointments scheduled with specialists.  Family gave ride home.

## 2024-10-04 LAB — CULTURE, BLOOD (ROUTINE X 2)
Culture: NO GROWTH
Culture: NO GROWTH
Special Requests: ADEQUATE
Special Requests: ADEQUATE

## 2024-10-07 ENCOUNTER — Encounter (HOSPITAL_COMMUNITY): Payer: Self-pay

## 2024-10-07 ENCOUNTER — Observation Stay (HOSPITAL_COMMUNITY)
Admission: EM | Admit: 2024-10-07 | Discharge: 2024-10-11 | Disposition: A | Discharge status: Home / Self Care | Attending: Family Medicine | Admitting: Family Medicine

## 2024-10-07 ENCOUNTER — Other Ambulatory Visit: Payer: Self-pay

## 2024-10-07 ENCOUNTER — Emergency Department (HOSPITAL_COMMUNITY)

## 2024-10-07 DIAGNOSIS — E039 Hypothyroidism, unspecified: Secondary | ICD-10-CM | POA: Diagnosis present

## 2024-10-07 DIAGNOSIS — A419 Sepsis, unspecified organism: Secondary | ICD-10-CM | POA: Diagnosis not present

## 2024-10-07 DIAGNOSIS — R627 Adult failure to thrive: Secondary | ICD-10-CM | POA: Diagnosis not present

## 2024-10-07 DIAGNOSIS — R69 Illness, unspecified: Secondary | ICD-10-CM | POA: Diagnosis present

## 2024-10-07 DIAGNOSIS — D649 Anemia, unspecified: Secondary | ICD-10-CM | POA: Diagnosis not present

## 2024-10-07 DIAGNOSIS — N135 Crossing vessel and stricture of ureter without hydronephrosis: Secondary | ICD-10-CM

## 2024-10-07 DIAGNOSIS — E872 Acidosis, unspecified: Secondary | ICD-10-CM | POA: Diagnosis present

## 2024-10-07 DIAGNOSIS — N179 Acute kidney failure, unspecified: Principal | ICD-10-CM | POA: Insufficient documentation

## 2024-10-07 DIAGNOSIS — R918 Other nonspecific abnormal finding of lung field: Secondary | ICD-10-CM | POA: Diagnosis not present

## 2024-10-07 DIAGNOSIS — I10 Essential (primary) hypertension: Secondary | ICD-10-CM | POA: Insufficient documentation

## 2024-10-07 DIAGNOSIS — E038 Other specified hypothyroidism: Secondary | ICD-10-CM | POA: Diagnosis not present

## 2024-10-07 DIAGNOSIS — R339 Retention of urine, unspecified: Secondary | ICD-10-CM | POA: Insufficient documentation

## 2024-10-07 DIAGNOSIS — E782 Mixed hyperlipidemia: Secondary | ICD-10-CM | POA: Insufficient documentation

## 2024-10-07 DIAGNOSIS — J90 Pleural effusion, not elsewhere classified: Secondary | ICD-10-CM | POA: Diagnosis not present

## 2024-10-07 DIAGNOSIS — E1165 Type 2 diabetes mellitus with hyperglycemia: Secondary | ICD-10-CM | POA: Diagnosis not present

## 2024-10-07 DIAGNOSIS — R6520 Severe sepsis without septic shock: Secondary | ICD-10-CM | POA: Diagnosis not present

## 2024-10-07 DIAGNOSIS — E119 Type 2 diabetes mellitus without complications: Secondary | ICD-10-CM | POA: Insufficient documentation

## 2024-10-07 DIAGNOSIS — R531 Weakness: Secondary | ICD-10-CM | POA: Diagnosis not present

## 2024-10-07 DIAGNOSIS — D72829 Elevated white blood cell count, unspecified: Secondary | ICD-10-CM | POA: Diagnosis present

## 2024-10-07 DIAGNOSIS — Q6239 Other obstructive defects of renal pelvis and ureter: Secondary | ICD-10-CM

## 2024-10-07 DIAGNOSIS — J189 Pneumonia, unspecified organism: Secondary | ICD-10-CM | POA: Insufficient documentation

## 2024-10-07 DIAGNOSIS — Q6211 Congenital occlusion of ureteropelvic junction: Secondary | ICD-10-CM | POA: Insufficient documentation

## 2024-10-07 DIAGNOSIS — I700 Atherosclerosis of aorta: Secondary | ICD-10-CM | POA: Insufficient documentation

## 2024-10-07 DIAGNOSIS — M069 Rheumatoid arthritis, unspecified: Secondary | ICD-10-CM | POA: Insufficient documentation

## 2024-10-07 DIAGNOSIS — K219 Gastro-esophageal reflux disease without esophagitis: Secondary | ICD-10-CM | POA: Diagnosis present

## 2024-10-07 HISTORY — DX: Adult failure to thrive: R62.7

## 2024-10-07 HISTORY — DX: Acidosis, unspecified: E87.20

## 2024-10-07 HISTORY — DX: Anemia, unspecified: D64.9

## 2024-10-07 LAB — CBC WITH DIFFERENTIAL/PLATELET
Abs Immature Granulocytes: 0.09 K/uL — ABNORMAL HIGH (ref 0.00–0.07)
Basophils Absolute: 0.1 K/uL (ref 0.0–0.1)
Basophils Relative: 0 %
Eosinophils Absolute: 0.1 K/uL (ref 0.0–0.5)
Eosinophils Relative: 0 %
HCT: 31.3 % — ABNORMAL LOW (ref 36.0–46.0)
Hemoglobin: 10.2 g/dL — ABNORMAL LOW (ref 12.0–15.0)
Immature Granulocytes: 0 %
Lymphocytes Relative: 5 %
Lymphs Abs: 1 K/uL (ref 0.7–4.0)
MCH: 27.9 pg (ref 26.0–34.0)
MCHC: 32.6 g/dL (ref 30.0–36.0)
MCV: 85.5 fL (ref 80.0–100.0)
Monocytes Absolute: 0.7 K/uL (ref 0.1–1.0)
Monocytes Relative: 3 %
Neutro Abs: 19.7 K/uL — ABNORMAL HIGH (ref 1.7–7.7)
Neutrophils Relative %: 92 %
Platelets: 234 K/uL (ref 150–400)
RBC: 3.66 MIL/uL — ABNORMAL LOW (ref 3.87–5.11)
RDW: 15.5 % (ref 11.5–15.5)
WBC: 21.6 K/uL — ABNORMAL HIGH (ref 4.0–10.5)
nRBC: 0 % (ref 0.0–0.2)

## 2024-10-07 LAB — COMPREHENSIVE METABOLIC PANEL WITH GFR
ALT: 16 U/L (ref 0–44)
AST: 19 U/L (ref 15–41)
Albumin: 3.7 g/dL (ref 3.5–5.0)
Alkaline Phosphatase: 55 U/L (ref 38–126)
Anion gap: 13 (ref 5–15)
BUN: 25 mg/dL — ABNORMAL HIGH (ref 8–23)
CO2: 21 mmol/L — ABNORMAL LOW (ref 22–32)
Calcium: 9.6 mg/dL (ref 8.9–10.3)
Chloride: 97 mmol/L — ABNORMAL LOW (ref 98–111)
Creatinine, Ser: 1.28 mg/dL — ABNORMAL HIGH (ref 0.44–1.00)
GFR, Estimated: 42 mL/min — ABNORMAL LOW (ref >60)
Glucose, Bld: 321 mg/dL — ABNORMAL HIGH (ref 70–99)
Potassium: 4 mmol/L (ref 3.5–5.1)
Sodium: 132 mmol/L — ABNORMAL LOW (ref 135–145)
Total Bilirubin: 0.7 mg/dL (ref 0.0–1.2)
Total Protein: 7.2 g/dL (ref 6.5–8.1)

## 2024-10-07 LAB — LACTIC ACID, PLASMA
Lactic Acid, Venous: 2.4 mmol/L (ref 0.5–1.9)
Lactic Acid, Venous: 1.5 mmol/L (ref 0.5–1.9)

## 2024-10-07 LAB — RESP PANEL BY RT-PCR (RSV, FLU A&B, COVID)  RVPGX2
Influenza A by PCR: NEGATIVE
Influenza B by PCR: NEGATIVE
Resp Syncytial Virus by PCR: NEGATIVE
SARS Coronavirus 2 by RT PCR: NEGATIVE

## 2024-10-07 LAB — GLUCOSE, CAPILLARY
Glucose-Capillary: 281 mg/dL — ABNORMAL HIGH (ref 70–99)
Glucose-Capillary: 317 mg/dL — ABNORMAL HIGH (ref 70–99)

## 2024-10-07 LAB — CBG MONITORING, ED: Glucose-Capillary: 293 mg/dL — ABNORMAL HIGH (ref 70–99)

## 2024-10-07 LAB — PRO BRAIN NATRIURETIC PEPTIDE: Pro Brain Natriuretic Peptide: 949 pg/mL — ABNORMAL HIGH (ref <300.0)

## 2024-10-07 MED ORDER — ENOXAPARIN SODIUM 40 MG/0.4ML IJ SOSY
40.0000 mg | PREFILLED_SYRINGE | INTRAMUSCULAR | Status: DC
  Administered 2024-10-07: 40 mg via SUBCUTANEOUS
  Filled 2024-10-07: qty 0.4

## 2024-10-07 MED ORDER — INSULIN GLARGINE-YFGN 100 UNIT/ML ~~LOC~~ SOLN
25.0000 [IU] | Freq: Every day | SUBCUTANEOUS | Status: DC
  Administered 2024-10-07 – 2024-10-08 (×2): 25 [IU] via SUBCUTANEOUS
  Filled 2024-10-07 (×3): qty 0.25

## 2024-10-07 MED ORDER — LACTATED RINGERS IV BOLUS
1000.0000 mL | Freq: Once | INTRAVENOUS | Status: AC
  Administered 2024-10-07: 1000 mL via INTRAVENOUS

## 2024-10-07 MED ORDER — TRAZODONE HCL 50 MG PO TABS
25.0000 mg | ORAL_TABLET | Freq: Every evening | ORAL | Status: DC | PRN
  Administered 2024-10-09 – 2024-10-10 (×2): 25 mg via ORAL
  Filled 2024-10-07 (×2): qty 1

## 2024-10-07 MED ORDER — PROCHLORPERAZINE EDISYLATE 10 MG/2ML IJ SOLN: 10.0000 mg | INTRAMUSCULAR | Status: DC | PRN

## 2024-10-07 MED ORDER — ASPIRIN 81 MG PO TBEC
81.0000 mg | DELAYED_RELEASE_TABLET | Freq: Every day | ORAL | Status: DC
  Administered 2024-10-08 – 2024-10-11 (×4): 81 mg via ORAL
  Filled 2024-10-07 (×4): qty 1

## 2024-10-07 MED ORDER — OXYCODONE HCL 5 MG PO TABS
5.0000 mg | ORAL_TABLET | Freq: Four times a day (QID) | ORAL | Status: DC | PRN
  Administered 2024-10-08 – 2024-10-11 (×7): 5 mg via ORAL
  Filled 2024-10-07 (×9): qty 1

## 2024-10-07 MED ORDER — ACETAMINOPHEN 650 MG RE SUPP: 650.0000 mg | Freq: Four times a day (QID) | RECTAL | Status: DC | PRN

## 2024-10-07 MED ORDER — NITROGLYCERIN 0.4 MG SL SUBL: 0.4000 mg | SUBLINGUAL_TABLET | SUBLINGUAL | Status: DC | PRN

## 2024-10-07 MED ORDER — MIRABEGRON ER 25 MG PO TB24
25.0000 mg | ORAL_TABLET | Freq: Every day | ORAL | Status: DC
  Administered 2024-10-08: 25 mg via ORAL
  Filled 2024-10-07 (×2): qty 1

## 2024-10-07 MED ORDER — ACETAMINOPHEN 325 MG PO TABS
650.0000 mg | ORAL_TABLET | Freq: Four times a day (QID) | ORAL | Status: DC | PRN
  Administered 2024-10-08 – 2024-10-11 (×5): 650 mg via ORAL
  Filled 2024-10-07 (×5): qty 2

## 2024-10-07 MED ORDER — VANCOMYCIN HCL 1500 MG/300ML IV SOLN
1500.0000 mg | Freq: Once | INTRAVENOUS | Status: AC
  Administered 2024-10-07: 1500 mg via INTRAVENOUS
  Filled 2024-10-07: qty 300

## 2024-10-07 MED ORDER — METOPROLOL SUCCINATE ER 25 MG PO TB24
25.0000 mg | ORAL_TABLET | Freq: Every day | ORAL | Status: DC
  Administered 2024-10-08 – 2024-10-11 (×4): 25 mg via ORAL
  Filled 2024-10-07 (×4): qty 1

## 2024-10-07 MED ORDER — SENNOSIDES-DOCUSATE SODIUM 8.6-50 MG PO TABS: 1.0000 | ORAL_TABLET | Freq: Every evening | ORAL | Status: DC | PRN

## 2024-10-07 MED ORDER — GABAPENTIN 100 MG PO CAPS
100.0000 mg | ORAL_CAPSULE | Freq: Three times a day (TID) | ORAL | Status: DC | PRN
  Administered 2024-10-09 – 2024-10-11 (×2): 100 mg via ORAL
  Filled 2024-10-07 (×2): qty 1

## 2024-10-07 MED ORDER — INSULIN ASPART 100 UNIT/ML IJ SOLN
5.0000 [IU] | Freq: Three times a day (TID) | INTRAMUSCULAR | Status: DC
  Administered 2024-10-07 – 2024-10-08 (×3): 5 [IU] via SUBCUTANEOUS
  Filled 2024-10-07 (×3): qty 1

## 2024-10-07 MED ORDER — FENTANYL CITRATE (PF) 100 MCG/2ML IJ SOLN: 12.5000 ug | INTRAMUSCULAR | Status: DC | PRN

## 2024-10-07 MED ORDER — INSULIN ASPART 100 UNIT/ML IJ SOLN
0.0000 [IU] | Freq: Three times a day (TID) | INTRAMUSCULAR | Status: DC
  Administered 2024-10-07: 5 [IU] via SUBCUTANEOUS
  Administered 2024-10-08: 2 [IU] via SUBCUTANEOUS
  Administered 2024-10-08: 5 [IU] via SUBCUTANEOUS
  Administered 2024-10-08 – 2024-10-09 (×2): 7 [IU] via SUBCUTANEOUS
  Filled 2024-10-07 (×5): qty 1

## 2024-10-07 MED ORDER — ATORVASTATIN CALCIUM 40 MG PO TABS
80.0000 mg | ORAL_TABLET | Freq: Every evening | ORAL | Status: DC
  Administered 2024-10-07 – 2024-10-10 (×4): 80 mg via ORAL
  Filled 2024-10-07 (×4): qty 2

## 2024-10-07 MED ORDER — ONDANSETRON HCL 4 MG/2ML IJ SOLN
4.0000 mg | Freq: Four times a day (QID) | INTRAMUSCULAR | Status: DC | PRN
  Administered 2024-10-09 – 2024-10-11 (×4): 4 mg via INTRAVENOUS
  Filled 2024-10-07 (×4): qty 2

## 2024-10-07 MED ORDER — TICAGRELOR 60 MG PO TABS
60.0000 mg | ORAL_TABLET | Freq: Two times a day (BID) | ORAL | Status: DC
  Administered 2024-10-07 – 2024-10-11 (×8): 60 mg via ORAL
  Filled 2024-10-07 (×10): qty 1

## 2024-10-07 MED ORDER — SODIUM CHLORIDE 0.9 % IV BOLUS
1000.0000 mL | Freq: Once | INTRAVENOUS | Status: AC
  Administered 2024-10-07: 1000 mL via INTRAVENOUS

## 2024-10-07 MED ORDER — BUDESON-GLYCOPYRROL-FORMOTEROL 160-9-4.8 MCG/ACT IN AERO
2.0000 | INHALATION_SPRAY | Freq: Two times a day (BID) | RESPIRATORY_TRACT | Status: DC
  Administered 2024-10-07 – 2024-10-11 (×8): 2 via RESPIRATORY_TRACT
  Filled 2024-10-07 (×2): qty 5.9

## 2024-10-07 MED ORDER — ALBUTEROL SULFATE (2.5 MG/3ML) 0.083% IN NEBU: 2.5000 mg | INHALATION_SOLUTION | RESPIRATORY_TRACT | Status: DC | PRN

## 2024-10-07 MED ORDER — VANCOMYCIN HCL IN DEXTROSE 1-5 GM/200ML-% IV SOLN: 1000.0000 mg | Freq: Once | INTRAVENOUS | Status: DC

## 2024-10-07 MED ORDER — LEVOTHYROXINE SODIUM 125 MCG PO TABS
125.0000 ug | ORAL_TABLET | Freq: Every day | ORAL | Status: DC
  Administered 2024-10-08 – 2024-10-11 (×4): 125 ug via ORAL
  Filled 2024-10-07 (×4): qty 1

## 2024-10-07 MED ORDER — PANTOPRAZOLE SODIUM 40 MG PO TBEC
40.0000 mg | DELAYED_RELEASE_TABLET | Freq: Every day | ORAL | Status: DC
  Administered 2024-10-08 – 2024-10-11 (×4): 40 mg via ORAL
  Filled 2024-10-07 (×4): qty 1

## 2024-10-07 MED ORDER — LACTATED RINGERS IV SOLN
INTRAVENOUS | Status: DC
Start: 2024-10-07 — End: 2024-10-08

## 2024-10-07 MED ORDER — AMOXICILLIN-POT CLAVULANATE 875-125 MG PO TABS
1.0000 | ORAL_TABLET | Freq: Two times a day (BID) | ORAL | Status: AC
  Administered 2024-10-08 – 2024-10-10 (×6): 1 via ORAL
  Filled 2024-10-07 (×6): qty 1

## 2024-10-07 MED ORDER — INSULIN ASPART 100 UNIT/ML IJ SOLN
0.0000 [IU] | Freq: Every day | INTRAMUSCULAR | Status: DC
  Administered 2024-10-07: 4 [IU] via SUBCUTANEOUS
  Administered 2024-10-08: 2 [IU] via SUBCUTANEOUS
  Filled 2024-10-07 (×2): qty 1

## 2024-10-07 MED ORDER — SODIUM CHLORIDE 0.9 % IV SOLN
2.0000 g | Freq: Once | INTRAVENOUS | Status: AC
  Administered 2024-10-07: 2 g via INTRAVENOUS
  Filled 2024-10-07: qty 12.5

## 2024-10-07 MED ORDER — ONDANSETRON HCL 4 MG PO TABS: 4.0000 mg | ORAL_TABLET | Freq: Four times a day (QID) | ORAL | Status: DC | PRN

## 2024-10-07 MED ORDER — ALPRAZOLAM 0.5 MG PO TABS
0.5000 mg | ORAL_TABLET | Freq: Every day | ORAL | Status: DC
  Administered 2024-10-07 – 2024-10-10 (×4): 0.5 mg via ORAL
  Filled 2024-10-07 (×4): qty 1

## 2024-10-07 NOTE — H&P (Signed)
 History and Physical  [facility]  Gabrielle Wood FMW:[id number] DOB: 1946-06-28 DOA: 10/07/2024  PCP: [name], MD  Patient coming from: Home by POV Level of care: Med-Surg  I have personally briefly reviewed patient's old medical records in [facility]  Chief Complaint: weakness, failure to thrive   HPI: Gabrielle Wood is a 78 year old female with recent failure to thrive, hypothyroidism, uncontrolled type 2 diabetes mellitus, hypertension, generalized weakness and debility, recently discovered chronic right UPJ obstruction, CAD status post NSTEMI in 2022 status post PCI DES to the LCx, history of DVT, GERD, hyperlipidemia, COPD, OA, recent hospitalization at [facility] for aspiration pneumonia, severe sepsis, acute metabolic encephalopathy, AKI who apparently initially had improved and was subsequently discharged home with home health services.  She is returning to the ED today by POV with a family member reporting that patient has been weak since being discharged from home and having difficulty ambulating with poor oral intake.  He has been having generalized weakness and a persistent cough.  She was found to be dehydrated with a blood glucose of 293 and an AKI with a creatinine of 1.28, WBC 21.6, lactic acid 2.4.  All of her viral testing has been negative.  She was hypotensive on arrival with a blood pressure of 84/54, MAP 64, pulse ox 92% on room air.  Fortunately her blood pressures improved after IV fluid bolus was given in the ED.  She was empirically started on IV antibiotics.  Admission was requested for further management.   Past Medical History:  Diagnosis Date   Arthritis    COPD (chronic obstructive pulmonary disease) (HCC)    Diabetes mellitus    DVT (deep venous thrombosis) (HCC)    years ago   GERD (gastroesophageal reflux disease)    H. pylori infection 2013   S/p treatment with amoxicillin , Biaxin, PPI   High cholesterol    Hypertension    Hypothyroidism     NSTEMI (non-ST elevated myocardial infarction) (HCC) 12/2020   s/p  PCI DES to the LCx   Thyroid  disease     Past Surgical History:  Procedure Laterality Date   BACK SURGERY     lumbar x3    ESOPHAGOGASTRODUODENOSCOPY  04/04/2012   Moderate gastritis/MODERATE Duodenitis in the duodenal bulb duodenum.  Pathology with chronic H. pylori gastritis.   EUS  05/14/2012   Normal pancreas, gallbladder,biliary tree/ Incidental finding of simple-appearing right renal cyst.   TONSILLECTOMY     TUBAL LIGATION     VEIN SURGERY     stripping years ago but recent laser surgery; bilateral legs     reports that she has quit smoking. Her smoking use included cigarettes. She has never used smokeless tobacco. She reports that she does not drink alcohol  and does not use drugs.  Allergies  Allergen Reactions   Amlodipine  Other (See Comments)    Unknown    Ciprocin-Fluocin-Procin [Fluocinolone] Other (See Comments)    Nightmares   Lipitor [Atorvastatin ] Other (See Comments)    Muscle aches, pain   Nebivolol Other (See Comments)    Unknown    Nitrofuran Derivatives Other (See Comments)    Unknown    Lisinopril Cough   Red Yeast Rice [Cholestin] Itching and Rash    Family History  Problem Relation Age of Onset   Pancreatic cancer Mother        diagnosed at age 27s, died from heart attack   Inflammatory bowel disease Brother  crohn's, his dgt deceased from colon cancer at age 28   Colon cancer Neg Hx     Prior to Admission medications   Medication Sig Start Date End Date Taking? Authorizing Provider  acetaminophen  (TYLENOL ) 500 MG tablet Take 1,000 mg by mouth in the morning and at bedtime.   Yes [provider]  albuterol  (VENTOLIN  HFA) 108 (90 Base) MCG/ACT inhaler Inhale 1-2 puffs into the lungs every 4 (four) hours as needed for wheezing or shortness of breath. 09/30/23  Yes Ula Prentice SAUNDERS, MD  ALPRAZolam  (XANAX ) 1 MG tablet Take 0.5 tablets (0.5 mg total) by mouth at  bedtime. 0.5 tablet in am and 0.5 tab at night Patient taking differently: Take 0.5 mg by mouth at bedtime. 08/19/24  Yes Cheryle Page, MD  amoxicillin -clavulanate (AUGMENTIN ) 875-125 MG tablet Take 1 tablet by mouth 2 (two) times daily. 10/02/24  Yes Sigdel, Derryl, MD  aspirin  EC 81 MG tablet Take 1 tablet (81 mg total) by mouth daily. Swallow whole. 10/02/24  Yes Sigdel, Santosh, MD  atorvastatin  (LIPITOR) 80 MG tablet Take 80 mg by mouth every evening. 11/24/21  Yes [provider]  baclofen (LIORESAL) 10 MG tablet Take 10 mg by mouth 2 (two) times daily as needed for muscle spasms. 08/06/24  Yes [provider]  BRILINTA  60 MG TABS tablet Take 60 mg by mouth 2 (two) times daily. 09/07/24  Yes [provider]  Fluticasone-Umeclidin-Vilant (TRELEGY ELLIPTA) 100-62.5-25 MCG/ACT AEPB Inhale 1 puff into the lungs daily.   Yes [provider]  gabapentin  (NEURONTIN ) 100 MG capsule Take 1 capsule (100 mg total) by mouth 3 (three) times daily. 08/19/24  Yes Cheryle Page, MD  insulin  degludec (TRESIBA  FLEXTOUCH) 100 UNIT/ML FlexTouch Pen Inject 26 Units into the skin at bedtime. Patient taking differently: Inject 26-32 Units into the skin at bedtime. 09/16/24  Yes Reardon, Benton PARAS, NP  insulin  lispro (HUMALOG  KWIKPEN) 100 UNIT/ML KwikPen Inject 8-14 Units into the skin 3 (three) times daily. Patient taking differently: Inject 8-15 Units into the skin 3 (three) times daily. 09/16/24  Yes Therisa Benton PARAS, NP  levothyroxine  (SYNTHROID ) 125 MCG tablet Take 1 tablet (125 mcg total) by mouth daily. 09/16/24  Yes Reardon, Benton PARAS, NP  magnesium  oxide (MAG-OX) 400 (240 Mg) MG tablet Take 400 mg by mouth daily.   Yes [provider]  metoprolol  succinate (TOPROL -XL) 25 MG 24 hr tablet Take 25 mg by mouth daily.   Yes [provider]  mirabegron ER (MYRBETRIQ) 25 MG TB24 tablet Take 25 mg by mouth daily.   Yes [provider]  nitrofurantoin,  macrocrystal-monohydrate, (MACROBID) 100 MG capsule Take 100 mg by mouth daily. 09/21/24  Yes [provider]  nitroGLYCERIN (NITROSTAT) 0.4 MG SL tablet Place 0.4 mg under the tongue every 5 (five) minutes as needed for chest pain.   Yes [provider]  OXYGEN  Inhale 2 L/min into the lungs as needed (shortness of breath, low O2 sats).   Yes [provider]  pantoprazole  (PROTONIX ) 40 MG tablet Take 40 mg by mouth daily. 06/06/21  Yes [provider]  senna-docusate (SENOKOT-S) 8.6-50 MG tablet Take 1 tablet by mouth at bedtime as needed for mild constipation. 10/02/24  Yes Sigdel, Santosh, MD  valsartan (DIOVAN) 40 MG tablet Take 40 mg by mouth 2 (two) times daily. 09/07/24  Yes [provider]  Continuous Glucose Sensor (FREESTYLE LIBRE 3 PLUS SENSOR) MISC Change sensor every 15 days. 09/16/24   Therisa Benton PARAS, NP  fexofenadine (ALLEGRA) 60 MG tablet Take by mouth.  04/13/21  [provider]    Physical Exam: Vitals:   10/07/24 1230 10/07/24 1245 10/07/24 1300 10/07/24 1315  BP: 118/67 118/64 128/65 137/67  Pulse: 68 67 66 68  Resp: (!) 22 (!) 24 (!) 23 (!) 22  Temp:      TempSrc:      SpO2: 96% 97% 97% 97%  Weight:      Height:       Constitutional: NAD, calm, comfortable Eyes: PERRL, lids and conjunctivae normal ENMT: Mucous membranes are dry. Posterior pharynx clear of any exudate or lesions.Normal dentition.  Neck: normal, supple, no masses, no thyromegaly Respiratory: clear to auscultation bilaterally, no wheezing, no crackles. Normal respiratory effort. No accessory muscle use.  Cardiovascular: normal s1, s2 sounds, no murmurs / rubs / gallops. No extremity edema. 2+ pedal pulses. No carotid bruits.  Abdomen: no tenderness, no masses palpated. No hepatosplenomegaly. Bowel sounds positive.  Musculoskeletal: no clubbing / cyanosis. No joint deformity upper and lower extremities. Good ROM, no contractures. Normal muscle tone.   Skin: no rashes, lesions, ulcers. No induration Neurologic: CN 2-12 grossly intact. Sensation intact, DTR normal. Strength 5/5 in all 4.  Psychiatric: Normal judgment and insight. Alert and oriented x 3. Normal mood.   Labs on Admission: I have personally reviewed following labs and imaging studies  CBC: Recent Labs  Lab 10/01/24 0913 10/07/24 1114  WBC 4.6 21.6*  NEUTROABS 3.0 19.7*  HGB 9.5* 10.2*  HCT 30.0* 31.3*  MCV 85.7 85.5  PLT 150 234   Basic Metabolic Panel: Recent Labs  Lab 10/01/24 0913 10/07/24 1114  NA 138 132*  K 3.9 4.0  CL 104 97*  CO2 24 21*  GLUCOSE 262* 321*  BUN 16 25*  CREATININE 0.73 1.28*  CALCIUM  9.4 9.6   GFR: Estimated Creatinine Clearance: 34.7 mL/min (A) (by C-G formula based on SCr of 1.28 mg/dL (H)). Liver Function Tests: Recent Labs  Lab 10/07/24 1114  AST 19  ALT 16  ALKPHOS 55  BILITOT 0.7  PROT 7.2  ALBUMIN 3.7   No results for input(s): LIPASE, AMYLASE in the last 168 hours. No results for input(s): AMMONIA in the last 168 hours. Coagulation Profile: No results for input(s): INR, PROTIME in the last 168 hours. Cardiac Enzymes: No results for input(s): CKTOTAL, CKMB, CKMBINDEX, TROPONINI in the last 168 hours. BNP (last 3 results) Recent Labs    10/07/24 1114  PROBNP 949.0*   HbA1C: No results for input(s): HGBA1C in the last 72 hours. CBG: Recent Labs  Lab 10/01/24 2333 10/02/24 0413 10/02/24 0722 10/02/24 1109 10/07/24 1049  GLUCAP 228* 154* 95 280* 293*   Lipid Profile: No results for input(s): CHOL, HDL, LDLCALC, TRIG, CHOLHDL, LDLDIRECT in the last 72 hours. Thyroid  Function Tests: No results for input(s): TSH, T4TOTAL, FREET4, T3FREE, THYROIDAB in the last 72 hours. Anemia Panel: No results for input(s): VITAMINB12, FOLATE, FERRITIN, TIBC, IRON, RETICCTPCT in the last 72 hours. Urine analysis:    Component Value Date/Time   COLORURINE YELLOW  09/29/2024 1716   APPEARANCEUR CLEAR 09/29/2024 1716   APPEARANCEUR Clear 10/02/2021 1400   LABSPEC 1.014 09/29/2024 1716   PHURINE 6.0 09/29/2024 1716   GLUCOSEU >=500 (A) 09/29/2024 1716   HGBUR SMALL (A) 09/29/2024 1716   BILIRUBINUR NEGATIVE 09/29/2024 1716   BILIRUBINUR Negative 10/02/2021 1400   KETONESUR NEGATIVE 09/29/2024 1716   PROTEINUR NEGATIVE 09/29/2024 1716   UROBILINOGEN 0.2 04/03/2012 2144   NITRITE NEGATIVE  09/29/2024 1716   LEUKOCYTESUR NEGATIVE 09/29/2024 1716    Radiological Exams on Admission: DG Chest 2 View Result Date: 10/07/2024 EXAM: 2 VIEW(S) XRAY OF THE CHEST 10/07/2024 11:12:00 AM COMPARISON: Prior studies are available. CLINICAL HISTORY: Weakness. FINDINGS: LUNGS AND PLEURA: Minimal left basal scarring or subsegmental atelectasis is noted. Small pleural effusion. No pulmonary edema. No pneumothorax. HEART AND MEDIASTINUM: No acute abnormality of the cardiac and mediastinal silhouettes. BONES AND SOFT TISSUES: No acute osseous abnormality. IMPRESSION: 1. Minimal left basal scarring or subsegmental atelectasis. 2. Small left pleural effusion. Electronically signed by: Lynwood Seip MD 10/07/2024 11:50 AM EST RP Workstation: [id number]   EKG: Independently reviewed.   Assessment/Plan Principal Problem:   AKI (acute kidney injury) Active Problems:   Leukocytosis   Lactic acidosis   Failure to thrive in adult   Mixed hyperlipidemia   Hypothyroidism   GERD (gastroesophageal reflux disease)   Taking multiple medications for chronic disease   Rheumatoid arthritis (HCC)   UPJ obstruction, congenital   Uncontrolled type 2 diabetes mellitus with hyperglycemia (HCC)   Normocytic anemia     Dehydration with AKI - likely precipitated by hyperglycemia - BS >300 on presentation  - IV fluids ordered - working on glycemic control - follow BMP   Adult Failure to thrive - multiple hospitalizations in past year at least monthly at different facilities -  palliative care consulted at last visit - she is DNR / DNI  - she had wanted to treat the treatable  - she clearly unable to care for self at home anymore - likely needs placement - PT/OT eval requested - TOC eval requested   Chronic UPJ obstruction - she recently saw Dr. Sherrilee in hospital  - he had recommended observation and outpatient follow up   Hypothyroidism  - resume home levothyroxine   Uncontrolled type 2 DM with hyperglycemia and vascular complications - A1c>9 - added basal lantus  25 units and novolog  5 units TID with meal plus supplemental SSI coverage - frequent CBG monitoring ordered  GERD - pantoprazole  ordered for GI protection  Lactic acidosis - secondary to dehydration  - sepsis ruled out - lactic acidosis resolved now  DNR/DNI - continue DNR orders in hospital   Leukocytosis - suspect reactive - she has been on antibiotic treatment for recent aspiration pneumonia - recheck CBC/diff in AM  - add on procalcitonin   Aspiration pneumonia - aspiration precautions - complete course of oral augmentin    DVT prophylaxis: enoxaparin    Code Status: DNR   Family Communication:   Disposition Plan: anticipate SNF   Consults called:   Admission status: INP Time spent:  60 mins  Level of care: Med-Surg Afton Louder MD [facility] How to contact the [facility] Attending or Consulting provider 7A - 7P or covering provider during after hours 7P -7A, for this patient?  Check the care team in [facility] and look for a) attending/consulting [facility] provider listed and b) the [facility] team listed Log into [email or web address] and use [facility] universal password to access. If you do not have the password, please contact the hospital operator. Locate the [facility] provider you are looking for under [facility] and page to a number that you can be directly reached. If you still have difficulty reaching the provider, please page the [facility] (Director on Call) for the Hospitalists  listed on amion for assistance.   If 7PM-7AM, please contact night-coverage [email or web address] Password [email or web address]  10/07/2024, 2:00 PM

## 2024-10-07 NOTE — Evaluation (Signed)
 Physical Therapy Evaluation Patient Details Name: Gabrielle Wood MRN: [id number] DOB: 05-10-1946 Today's Date: 10/07/2024  History of Present Illness  Gabrielle Wood is a 78 year old female with recent failure to thrive, hypothyroidism, uncontrolled type 2 diabetes mellitus, hypertension, generalized weakness and debility, recently discovered chronic right UPJ obstruction, CAD status post NSTEMI in 2022 status post PCI DES to the LCx, history of DVT, GERD, hyperlipidemia, COPD, OA, recent hospitalization at [facility] for aspiration pneumonia, severe sepsis, acute metabolic encephalopathy, AKI who apparently initially had improved and was subsequently discharged home with home health services.  She is returning to the ED today by POV with a family member reporting that patient has been weak since being discharged from home and having difficulty ambulating with poor oral intake.  He has been having generalized weakness and a persistent cough.  She was found to be dehydrated with a blood glucose of 293 and an AKI with a creatinine of 1.28, WBC 21.6, lactic acid 2.4.  All of her viral testing has been negative.  She was hypotensive on arrival with a blood pressure of 84/54, MAP 64, pulse ox 92% on room air.  Fortunately her blood pressures improved after IV fluid bolus was given in the ED.  She was empirically started on IV antibiotics.  Admission was requested for further management.   Clinical Impression  Patient demonstrates slow labored movement for sitting up at bedside requiring HOB partially raised and use of bed fail, labored movement for completing sit to stands and able to ambulate in room, hallway without loss of balance, but limited mostly due to fatigue/generalized weakness. Patient tolerated sitting up in chair after therapy on room air with SpO2 at 96%. Patient will benefit from continued skilled physical therapy in hospital and recommended venue below to increase strength, balance, endurance for  safe ADLs and gait.           If plan is discharge home, recommend the following: A little help with walking and/or transfers;A little help with bathing/dressing/bathroom;Assist for transportation;Assistance with cooking/housework   Can travel by private vehicle        Equipment Recommendations None recommended by PT  Recommendations for Other Services       Functional Status Assessment Patient has had a recent decline in their functional status and demonstrates the ability to make significant improvements in function in a reasonable and predictable amount of time.     Precautions / Restrictions Precautions Precautions: Fall Recall of Precautions/Restrictions: Intact Restrictions Weight Bearing Restrictions Per Provider Order: No      Mobility  Bed Mobility Overal bed mobility: Needs Assistance Bed Mobility: Supine to Sit     Supine to sit: Min assist, HOB elevated     General bed mobility comments: slow labored movement had to use bed rail and HOB partially rasied    Transfers Overall transfer level: Needs assistance Equipment used: Rolling walker (2 wheels) Transfers: Sit to/from Stand, Bed to chair/wheelchair/BSC Sit to Stand: Contact guard assist, Min assist   Step pivot transfers: Contact guard assist       General transfer comment: slow labored movement    Ambulation/Gait Ambulation/Gait assistance: Contact guard assist, Min assist Gait Distance (Feet): 45 Feet Assistive device: Rolling walker (2 wheels) Gait Pattern/deviations: Decreased step length - right, Decreased step length - left, Decreased stride length, Trunk flexed Gait velocity: decreased     General Gait Details: slow labored movement without loss of balance, limited mostly due to fatigue, on AIR with SpO2 at 96%  Stairs            Wheelchair Mobility     Tilt Bed    Modified Rankin (Stroke Patients Only)       Balance Overall balance assessment: Needs  assistance Sitting-balance support: Feet supported, No upper extremity supported Sitting balance-Leahy Scale: Good Sitting balance - Comments: seated at EOB   Standing balance support: Reliant on assistive device for balance, During functional activity, Bilateral upper extremity supported Standing balance-Leahy Scale: Fair Standing balance comment: fair/good using RW                             Pertinent Vitals/Pain Pain Assessment Pain Assessment: 0-10 Pain Score: 7  Pain Location: RLE Pain Descriptors / Indicators: Discomfort, Sore Pain Intervention(s): Limited activity within patient's tolerance, Monitored during session    Home Living Family/patient expects to be discharged to:: Private residence Living Arrangements: Alone Available Help at Discharge: Family;Available PRN/intermittently Type of Home: House Home Access: Stairs to enter Entrance Stairs-Rails: Right;Left;Can reach both Entrance Stairs-Number of Steps: 3   Home Layout: One level Home Equipment: Agricultural Consultant (2 wheels);Shower seat;Shower seat - built in;Grab bars - tub/shower;Wheelchair - manual Additional Comments: Pt. 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 ADLs Comments: A little assist for bathing, dressing, and assisted for IADL's.     Extremity/Trunk Assessment   Upper Extremity Assessment Upper Extremity Assessment: Defer to OT evaluation    Lower Extremity Assessment Lower Extremity Assessment: Generalized weakness    Cervical / Trunk Assessment Cervical / Trunk Assessment: Normal  Communication   Communication Communication: Impaired Factors Affecting  Communication: Hearing impaired    Cognition Arousal: Alert Behavior During Therapy: WFL for tasks assessed/performed   PT - Cognitive impairments: No apparent impairments                         Following commands: Intact       Cueing Cueing Techniques: Verbal cues, Tactile cues     General Comments      Exercises     Assessment/Plan    PT Assessment Patient needs continued PT services  PT Problem List Decreased strength;Decreased activity tolerance;Decreased balance;Decreased mobility       PT Treatment Interventions DME instruction;Gait training;Stair training;Functional mobility training;Therapeutic activities;Therapeutic exercise;Balance training;Patient/family education    PT Goals (Current goals can be found in the Care Plan section)  Acute Rehab PT Goals Patient Stated Goal: return home with family to assist PT Goal Formulation: With patient Time For Goal Achievement: 10/10/24 Potential to Achieve Goals: Good    Frequency Min 3X/week     Co-evaluation               AM-PAC PT 6 Clicks Mobility  Outcome Measure Help needed turning from your back to your side while in a flat bed without using bedrails?: A Little Help needed moving from lying on your back to sitting on the side of a flat bed without using bedrails?: A Little Help needed moving to and from a bed to a chair (including a wheelchair)?: A Little Help needed standing up from a chair using your arms (e.g., wheelchair or bedside chair)?: A  Little Help needed to walk in hospital room?: A Little Help needed climbing 3-5 steps with a railing? : A Lot 6 Click Score: 17    End of Session   Activity Tolerance: Patient tolerated treatment well;Patient limited by fatigue Patient left: in chair;with call bell/phone within reach Nurse Communication: Mobility status PT Visit Diagnosis: Other abnormalities of gait and mobility (R26.89);Muscle weakness (generalized) (M62.81);History of  falling (Z91.81)    Time: 1510-1540 PT Time Calculation (min) (ACUTE ONLY): 30 min   Charges:   PT Evaluation $PT Eval Moderate Complexity: 1 Mod PT Treatments $Therapeutic Activity: 23-37 mins PT General Charges $$ ACUTE PT VISIT: 1 Visit         3:55 PM, 10/07/24 Lynwood Music, MPT Physical Therapist with [facility] [phone] office [phone] mobile phone

## 2024-10-07 NOTE — Hospital Course (Signed)
 78 year old female with recent failure to thrive, hypothyroidism, uncontrolled type 2 diabetes mellitus, hypertension, generalized weakness and debility, recently discovered chronic right UPJ obstruction, CAD status post NSTEMI in 2022 status post PCI DES to the LCx, history of DVT, GERD, hyperlipidemia, COPD, OA, recent hospitalization at [facility] for aspiration pneumonia, severe sepsis, acute metabolic encephalopathy, AKI who apparently initially had improved and was subsequently discharged home with home health services.  She is returning to the ED today by POV with a family member reporting that patient has been weak since being discharged from home and having difficulty ambulating with poor oral intake.  He has been having generalized weakness and a persistent cough.  She was found to be dehydrated with a blood glucose of 293 and an AKI with a creatinine of 1.28, WBC 21.6, lactic acid 2.4.  All of her viral testing has been negative.  She was hypotensive on arrival with a blood pressure of 84/54, MAP 64, pulse ox 92% on room air.  Fortunately her blood pressures improved after IV fluid bolus was given in the ED.  She was empirically started on IV antibiotics.  Admission was requested for further management.

## 2024-10-07 NOTE — ED Triage Notes (Signed)
 Pt arrived via POV with a family member who reports Pt has been weak since being discharged home from the hospital and been unable to walk. Pt reports to have decreased appetite and is taking Macrobid for recurrent UTI's.

## 2024-10-07 NOTE — ED Provider Notes (Signed)
 [place] EMERGENCY DEPARTMENT AT [facility] Provider Note   CSN: [id number] Arrival date & time: 10/07/24  1017     Patient presents with: Weakness   Gabrielle Wood is a 78 y.o. female.   HPI Patient presents with her daughter who assists with the history.  History notable for admission, discharge following sepsis 5 days ago.  After transiently being better at home, patient has declined, has persistent cough, weakness.  No pain, no focal weakness.     Prior to Admission medications   Medication Sig Start Date End Date Taking? Authorizing Provider  acetaminophen  (TYLENOL ) 500 MG tablet Take 1,000 mg by mouth in the morning and at bedtime.   Yes [provider]  albuterol  (VENTOLIN  HFA) 108 (90 Base) MCG/ACT inhaler Inhale 1-2 puffs into the lungs every 4 (four) hours as needed for wheezing or shortness of breath. 09/30/23  Yes Ula Prentice SAUNDERS, MD  ALPRAZolam  (XANAX ) 1 MG tablet Take 0.5 tablets (0.5 mg total) by mouth at bedtime. 0.5 tablet in am and 0.5 tab at night Patient taking differently: Take 0.5 mg by mouth at bedtime. 08/19/24  Yes Cheryle Page, MD  amoxicillin -clavulanate (AUGMENTIN ) 875-125 MG tablet Take 1 tablet by mouth 2 (two) times daily. 10/02/24  Yes Sigdel, Derryl, MD  aspirin  EC 81 MG tablet Take 1 tablet (81 mg total) by mouth daily. Swallow whole. 10/02/24  Yes Sigdel, Santosh, MD  atorvastatin  (LIPITOR) 80 MG tablet Take 80 mg by mouth every evening. 11/24/21  Yes [provider]  baclofen (LIORESAL) 10 MG tablet Take 10 mg by mouth 2 (two) times daily as needed for muscle spasms. 08/06/24  Yes [provider]  BRILINTA  60 MG TABS tablet Take 60 mg by mouth 2 (two) times daily. 09/07/24  Yes [provider]  Fluticasone-Umeclidin-Vilant (TRELEGY ELLIPTA) 100-62.5-25 MCG/ACT AEPB Inhale 1 puff into the lungs daily.   Yes [provider]  gabapentin  (NEURONTIN ) 100 MG capsule Take 1 capsule (100 mg total) by  mouth 3 (three) times daily. 08/19/24  Yes Cheryle Page, MD  insulin  degludec (TRESIBA  FLEXTOUCH) 100 UNIT/ML FlexTouch Pen Inject 26 Units into the skin at bedtime. Patient taking differently: Inject 26-32 Units into the skin at bedtime. 09/16/24  Yes Reardon, Benton JINNY, NP  insulin  lispro (HUMALOG  KWIKPEN) 100 UNIT/ML KwikPen Inject 8-14 Units into the skin 3 (three) times daily. Patient taking differently: Inject 8-15 Units into the skin 3 (three) times daily. 09/16/24  Yes Therisa Benton JINNY, NP  levothyroxine  (SYNTHROID ) 125 MCG tablet Take 1 tablet (125 mcg total) by mouth daily. 09/16/24  Yes Therisa Benton JINNY, NP  magnesium  oxide (MAG-OX) 400 (240 Mg) MG tablet Take 400 mg by mouth daily.   Yes [provider]  metoprolol  succinate (TOPROL -XL) 25 MG 24 hr tablet Take 25 mg by mouth daily.   Yes [provider]  mirabegron ER (MYRBETRIQ) 25 MG TB24 tablet Take 25 mg by mouth daily.   Yes [provider]  nitrofurantoin, macrocrystal-monohydrate, (MACROBID) 100 MG capsule Take 100 mg by mouth daily. 09/21/24  Yes [provider]  nitroGLYCERIN (NITROSTAT) 0.4 MG SL tablet Place 0.4 mg under the tongue every 5 (five) minutes as needed for chest pain.   Yes [provider]  OXYGEN  Inhale 2 L/min into the lungs as needed (shortness of breath, low O2 sats).   Yes [provider]  pantoprazole  (PROTONIX ) 40 MG tablet Take 40 mg by mouth daily. 06/06/21  Yes [provider]  senna-docusate (SENOKOT-S) 8.6-50 MG tablet Take 1 tablet by mouth at bedtime as needed for mild constipation. 10/02/24  Yes Sigdel, Santosh, MD  valsartan (DIOVAN) 40 MG tablet Take 40 mg by mouth 2 (two) times daily. 09/07/24  Yes [provider]  Continuous Glucose Sensor (FREESTYLE LIBRE 3 PLUS SENSOR) MISC Change sensor every 15 days. 09/16/24   Therisa Benton PARAS, NP  fexofenadine (ALLEGRA) 60 MG tablet Take by mouth.  04/13/21  [provider]     Allergies: Amlodipine , Ciprocin-fluocin-procin [fluocinolone], Lipitor [atorvastatin ], Nebivolol, Nitrofuran derivatives, Lisinopril, and Red yeast rice [cholestin]    Review of Systems  Updated Vital Signs BP 137/67   Pulse 68   Temp 97.8 F (36.6 C) (Oral)   Resp (!) 22   Ht 1.702 m (5' 7)   Wt 66.7 kg   SpO2 97%   BMI 23.02 kg/m   Physical Exam Vitals and nursing note reviewed.  Constitutional:      Appearance: She is well-developed. She is ill-appearing.  HENT:     Head: Normocephalic and atraumatic.  Eyes:     Conjunctiva/sclera: Conjunctivae normal.  Cardiovascular:     Rate and Rhythm: Normal rate and regular rhythm.  Pulmonary:     Effort: Pulmonary effort is normal. Tachypnea present.     Breath sounds: No stridor.  Abdominal:     General: There is no distension.  Skin:    General: Skin is warm and dry.  Neurological:     Mental Status: She is alert and oriented to person, place, and time.     Cranial Nerves: No cranial nerve deficit.  Psychiatric:        Behavior: Behavior is slowed and withdrawn.     (all labs ordered are listed, but only abnormal results are displayed) Labs Reviewed  LACTIC ACID, PLASMA - Abnormal; Notable for the following components:      Result Value   Lactic Acid, Venous 2.4 (*)    All other components within normal limits  COMPREHENSIVE METABOLIC PANEL WITH GFR - Abnormal; Notable for the following components:   Sodium 132 (*)    Chloride 97 (*)    CO2 21 (*)    Glucose, Bld 321 (*)    BUN 25 (*)    Creatinine, Ser 1.28 (*)    GFR, Estimated 42 (*)    All other components within normal limits  CBC WITH DIFFERENTIAL/PLATELET - Abnormal; Notable for the following components:   WBC 21.6 (*)    RBC 3.66 (*)    Hemoglobin 10.2 (*)    HCT 31.3 (*)    Neutro Abs 19.7 (*)    Abs Immature Granulocytes 0.09 (*)    All other components within normal limits  PRO BRAIN NATRIURETIC PEPTIDE - Abnormal; Notable for the following  components:   Pro Brain Natriuretic Peptide 949.0 (*)    All other components within normal limits  CBG MONITORING, ED - Abnormal; Notable for the following components:   Glucose-Capillary 293 (*)    All other components within normal limits  RESP PANEL BY RT-PCR (RSV, FLU A&B, COVID)  [id number]  LACTIC ACID, PLASMA  URINALYSIS, W/ REFLEX TO CULTURE (INFECTION SUSPECTED)    EKG: EKG Interpretation Date/Time:  Wednesday October 07 2024 10:54:50 EST Ventricular Rate:  84 PR Interval:  150 QRS Duration:  84 QT Interval:  376 QTC Calculation: 444 R Axis:   -48  Text Interpretation: Sinus rhythm with Premature atrial complexes in a pattern of bigeminy Left anterior fascicular  block Abnormal ECG Confirmed by Garrick Charleston [phone]) on 10/07/2024 12:59:58 PM  Radiology: DG Chest 2 View Result Date: 10/07/2024 EXAM: 2 VIEW(S) XRAY OF THE CHEST 10/07/2024 11:12:00 AM COMPARISON: Prior studies are available. CLINICAL HISTORY: Weakness. FINDINGS: LUNGS AND PLEURA: Minimal left basal scarring or subsegmental atelectasis is noted. Small pleural effusion. No pulmonary edema. No pneumothorax. HEART AND MEDIASTINUM: No acute abnormality of the cardiac and mediastinal silhouettes. BONES AND SOFT TISSUES: No acute osseous abnormality. IMPRESSION: 1. Minimal left basal scarring or subsegmental atelectasis. 2. Small left pleural effusion. Electronically signed by: Lynwood Seip MD 10/07/2024 11:50 AM EST RP Workstation: [id number]     Procedures   Medications Ordered in the ED  lactated ringers  bolus 1,000 mL (has no administration in time range)  vancomycin  (VANCOCIN ) IVPB 1000 mg/200 mL premix (has no administration in time range)  ceFEPIme  (MAXIPIME ) 2 g in sodium chloride  0.9 % 100 mL IVPB (has no administration in time range)  sodium chloride  0.9 % bolus 1,000 mL (1,000 mLs Intravenous New Bag/Given 10/07/24 1151)                                    Medical Decision Making Elderly female with  recent hospitalization for sepsis presents with weakness, fatigue.  On exam she is clinically dehydrated, and with tachypnea, concern for sepsis versus dehydration versus electrolyte abnormalities.  Cardiac 70 sinus normal pulse ox 94% room air borderline  Amount and/or Complexity of Data Reviewed Independent Historian: caregiver External Data Reviewed: notes.    Details: Discharge summary with sepsis reviewed Labs: ordered. Decision-making details documented in ED Course. Radiology: ordered and independent interpretation performed. Decision-making details documented in ED Course. ECG/medicine tests: ordered and independent interpretation performed. Decision-making details documented in ED Course.  Risk Prescription drug management. Decision regarding hospitalization. Diagnosis or treatment significantly limited by social determinants of health.  Following initial presentation with hypotension patient received fluid resuscitation and had appreciable increase in her blood pressure. With concerns as above for sepsis patient had x-ray, labs, labs notable for leukocytosis greater than during her recent hospitalization, and with abnormal x-ray, recent CT with evidence for persistent pneumonia patient will start broad-spectrum antibiotics, designated as a code sepsis activation.  Improved blood pressure reassuring, though the patient was also found to have acute kidney injury.  Additional resuscitation will be conducted judiciously given elevated BNP, advanced age. With concern for sepsis, patient will require admission to stepdown unit. CRITICAL CARE Performed by: [place] Garrick Total critical care time: 35 minutes Critical care time was exclusive of separately billable procedures and treating other patients. Critical care was necessary to treat or prevent imminent or life-threatening deterioration. Critical care was time spent personally by me on the following activities: development of  treatment plan with patient and/or surrogate as well as nursing, discussions with consultants, evaluation of patient's response to treatment, examination of patient, obtaining history from patient or surrogate, ordering and performing treatments and interventions, ordering and review of laboratory studies, ordering and review of radiographic studies, pulse oximetry and re-evaluation of patient's condition.   Final diagnoses:  Sepsis with acute renal failure and septic shock, due to unspecified organism, unspecified acute renal failure type [facility])    ED Discharge Orders     None          Garrick Charleston, MD 10/07/24 1324

## 2024-10-07 NOTE — Inpatient Diabetes Management (Signed)
 Inpatient Diabetes Program Recommendations  AACE/ADA: New Consensus Statement on Inpatient Glycemic Control   Target Ranges:  Prepandial:   less than 140 mg/dL      Peak postprandial:   less than 180 mg/dL (1-2 hours)      Critically ill patients:  140 - 180 mg/dL    Latest Reference Range & Units 10/07/24 11:14  Glucose 70 - 99 mg/dL 678 (H)   Review of Glycemic Control  Diabetes history: DM2 Outpatient Diabetes medications: Tresiba  26-32 units at bedtime, Humalog  8-15 units TID with meals Current orders for Inpatient glycemic control: None; in ED  Inpatient Diabetes Program Recommendations:    Insulin : If admitted, please consider ordering Semglee  20 units at bedtime, CBGs AC&HS, Novolog  0-15 units TID with meals, Novolog  0-5 units at bedtime, and if diet ordered Novolog  3 units TID with meals if patient eats at least 50% of meals.  NOTE: Patient is currently in ED and lab glucose 321 mg/dl today. Patient was recently inpatient 10/28-10/31/25.   Thanks, Earnie Gainer, RN, MSN, CDCES Diabetes Coordinator Inpatient Diabetes Program [phone] (Team Pager from 8am to 5pm)

## 2024-10-07 NOTE — Plan of Care (Signed)
  Problem: Acute Rehab PT Goals(only PT should resolve) Goal: Pt Will Go Supine/Side To Sit Outcome: Progressing Flowsheets (Taken 10/07/2024 1556) Pt will go Supine/Side to Sit:  with modified independence  with supervision Goal: Patient Will Transfer Sit To/From Stand Outcome: Progressing Flowsheets (Taken 10/07/2024 1556) Patient will transfer sit to/from stand:  with modified independence  with supervision Goal: Pt Will Transfer Bed To Chair/Chair To Bed Outcome: Progressing Flowsheets (Taken 10/07/2024 1556) Pt will Transfer Bed to Chair/Chair to Bed:  with modified independence  with supervision Goal: Pt Will Ambulate Outcome: Progressing Flowsheets (Taken 10/07/2024 1556) Pt will Ambulate:  75 feet  with contact guard assist  with supervision  with rolling walker  3:57 PM, 10/07/24 Lynwood Music, MPT Physical Therapist with [facility] [phone] office [phone] mobile phone

## 2024-10-07 NOTE — Sepsis Progress Note (Signed)
 eLink is following this Code Sepsis.

## 2024-10-08 DIAGNOSIS — N179 Acute kidney failure, unspecified: Secondary | ICD-10-CM | POA: Diagnosis not present

## 2024-10-08 DIAGNOSIS — K219 Gastro-esophageal reflux disease without esophagitis: Secondary | ICD-10-CM

## 2024-10-08 DIAGNOSIS — Q6239 Other obstructive defects of renal pelvis and ureter: Secondary | ICD-10-CM | POA: Diagnosis not present

## 2024-10-08 DIAGNOSIS — D72829 Elevated white blood cell count, unspecified: Secondary | ICD-10-CM | POA: Diagnosis not present

## 2024-10-08 DIAGNOSIS — R627 Adult failure to thrive: Secondary | ICD-10-CM | POA: Diagnosis not present

## 2024-10-08 DIAGNOSIS — E039 Hypothyroidism, unspecified: Secondary | ICD-10-CM

## 2024-10-08 DIAGNOSIS — J441 Chronic obstructive pulmonary disease with (acute) exacerbation: Secondary | ICD-10-CM | POA: Diagnosis not present

## 2024-10-08 DIAGNOSIS — E872 Acidosis, unspecified: Secondary | ICD-10-CM | POA: Diagnosis not present

## 2024-10-08 LAB — BASIC METABOLIC PANEL WITH GFR
Anion gap: 10 (ref 5–15)
BUN: 18 mg/dL (ref 8–23)
CO2: 21 mmol/L — ABNORMAL LOW (ref 22–32)
Calcium: 9.6 mg/dL (ref 8.9–10.3)
Chloride: 106 mmol/L (ref 98–111)
Creatinine, Ser: 0.78 mg/dL (ref 0.44–1.00)
GFR, Estimated: >60 mL/min (ref >60)
Glucose, Bld: 197 mg/dL — ABNORMAL HIGH (ref 70–99)
Potassium: 3.9 mmol/L (ref 3.5–5.1)
Sodium: 137 mmol/L (ref 135–145)

## 2024-10-08 LAB — GLUCOSE, CAPILLARY
Glucose-Capillary: 173 mg/dL — ABNORMAL HIGH (ref 70–99)
Glucose-Capillary: 175 mg/dL — ABNORMAL HIGH (ref 70–99)
Glucose-Capillary: 339 mg/dL — ABNORMAL HIGH (ref 70–99)
Glucose-Capillary: 286 mg/dL — ABNORMAL HIGH (ref 70–99)
Glucose-Capillary: 248 mg/dL — ABNORMAL HIGH (ref 70–99)

## 2024-10-08 LAB — URINALYSIS, W/ REFLEX TO CULTURE (INFECTION SUSPECTED)
Bacteria, UA: NONE SEEN
Bilirubin Urine: NEGATIVE
Glucose, UA: 150 mg/dL — AB
Hgb urine dipstick: NEGATIVE
Ketones, ur: NEGATIVE mg/dL
Leukocytes,Ua: NEGATIVE
Nitrite: NEGATIVE
Protein, ur: NEGATIVE mg/dL
Specific Gravity, Urine: 1.008 (ref 1.005–1.030)
pH: 6 (ref 5.0–8.0)

## 2024-10-08 LAB — CBC WITH DIFFERENTIAL/PLATELET
Abs Immature Granulocytes: 0.05 K/uL (ref 0.00–0.07)
Basophils Absolute: 0 K/uL (ref 0.0–0.1)
Basophils Relative: 0 %
Eosinophils Absolute: 0.3 K/uL (ref 0.0–0.5)
Eosinophils Relative: 3 %
HCT: 29 % — ABNORMAL LOW (ref 36.0–46.0)
Hemoglobin: 9.3 g/dL — ABNORMAL LOW (ref 12.0–15.0)
Immature Granulocytes: 1 %
Lymphocytes Relative: 16 %
Lymphs Abs: 1.5 K/uL (ref 0.7–4.0)
MCH: 27.1 pg (ref 26.0–34.0)
MCHC: 32.1 g/dL (ref 30.0–36.0)
MCV: 84.5 fL (ref 80.0–100.0)
Monocytes Absolute: 0.4 K/uL (ref 0.1–1.0)
Monocytes Relative: 4 %
Neutro Abs: 7 K/uL (ref 1.7–7.7)
Neutrophils Relative %: 76 %
Platelets: 221 K/uL (ref 150–400)
RBC: 3.43 MIL/uL — ABNORMAL LOW (ref 3.87–5.11)
RDW: 15.4 % (ref 11.5–15.5)
WBC: 9.2 K/uL (ref 4.0–10.5)
nRBC: 0 % (ref 0.0–0.2)

## 2024-10-08 LAB — MAGNESIUM: Magnesium: 1.9 mg/dL (ref 1.7–2.4)

## 2024-10-08 MED ORDER — INSULIN ASPART 100 UNIT/ML IJ SOLN
8.0000 [IU] | Freq: Three times a day (TID) | INTRAMUSCULAR | Status: DC
  Administered 2024-10-08: 8 [IU] via SUBCUTANEOUS
  Filled 2024-10-08: qty 1

## 2024-10-08 MED ORDER — MAGNESIUM SULFATE 2 GM/50ML IV SOLN
2.0000 g | Freq: Once | INTRAVENOUS | Status: AC
  Administered 2024-10-08: 2 g via INTRAVENOUS
  Filled 2024-10-08: qty 50

## 2024-10-08 MED ORDER — SACCHAROMYCES BOULARDII 250 MG PO CAPS
250.0000 mg | ORAL_CAPSULE | Freq: Two times a day (BID) | ORAL | Status: DC
  Administered 2024-10-08 – 2024-10-11 (×5): 250 mg via ORAL
  Filled 2024-10-08 (×6): qty 1

## 2024-10-08 MED ORDER — LACTATED RINGERS IV SOLN: INTRAVENOUS | Status: DC

## 2024-10-08 NOTE — Plan of Care (Signed)

## 2024-10-08 NOTE — Plan of Care (Signed)
   Problem: Activity: Goal: Risk for activity intolerance will decrease Outcome: Progressing   Problem: Coping: Goal: Level of anxiety will decrease Outcome: Progressing

## 2024-10-08 NOTE — Progress Notes (Signed)
 PROGRESS NOTE   TENEA SENS  FMW:[id number] DOB: 01-30-1946 DOA: 10/07/2024 PCP: [name], MD   Chief Complaint  Patient presents with   Weakness   Level of care: Med-Surg  Brief Admission History:  78 year old female with recent failure to thrive, hypothyroidism, uncontrolled type 2 diabetes mellitus, hypertension, generalized weakness and debility, recently discovered chronic right UPJ obstruction, CAD status post NSTEMI in 2022 status post PCI DES to the LCx, history of DVT, GERD, hyperlipidemia, COPD, OA, recent hospitalization at [facility] for aspiration pneumonia, severe sepsis, acute metabolic encephalopathy, AKI who apparently initially had improved and was subsequently discharged home with home health services.  She is returning to the ED today by POV with a family member reporting that patient has been weak since being discharged from home and having difficulty ambulating with poor oral intake.  He has been having generalized weakness and a persistent cough.  She was found to be dehydrated with a blood glucose of 293 and an AKI with a creatinine of 1.28, WBC 21.6, lactic acid 2.4.  All of her viral testing has been negative.  She was hypotensive on arrival with a blood pressure of 84/54, MAP 64, pulse ox 92% on room air.  Fortunately her blood pressures improved after IV fluid bolus was given in the ED.  She was empirically started on IV antibiotics.  Admission was requested for further management.   Assessment and Plan:  Dehydration with AKI - likely precipitated by hyperglycemia - BS >300 on presentation  - IV fluids ordered - working on glycemic control - follow BMP    Adult Failure to thrive - multiple hospitalizations in past year at least monthly at different facilities - palliative care consulted at last visit - she is DNR / DNI  - she had wanted to treat the treatable  - pt adamant she will NOT go to SNF - PT/OT eval requested -- home health recommended  - TOC eval  requested    Chronic UPJ obstruction - she recently saw Dr. Sherrilee in hospital  - he had recommended observation and outpatient follow up  - bladder scans every 4 hours ordered for now - cath PRN if unable to void    Hypothyroidism  - resumed home levothyroxine    Uncontrolled type 2 DM with hyperglycemia and vascular complications - A1c>9 - added basal lantus  25 units and increasing novolog  to 8 units TID with meal plus supplemental SSI coverage - frequent CBG monitoring ordered CBG (last 3)  Recent Labs    10/08/24 0411 10/08/24 0753 10/08/24 1113  GLUCAP 173* 175* 339*    GERD - pantoprazole  ordered for GI protection   Lactic acidosis--RESOLVED - secondary to dehydration  - sepsis ruled out - lactic acidosis resolved now   DNR/DNI - continue DNR orders in hospital    Leukocytosis--RESOLVED  - suspect reactive - she has been on antibiotic treatment for recent aspiration pneumonia - recheck CBC/diff in AM  - add on procalcitonin    Aspiration pneumonia - aspiration precautions - complete course of oral augmentin  (probiotics added)   DVT prophylaxis: enoxaparin   Code Status: DNR  Family Communication:  Disposition: declining SNF, planning home with [facility]    Consultants:   Procedures:   Antimicrobials:    Subjective: Pt reporting that she is hungry and wanting to eat breakfast.   Objective: Vitals:   10/08/24 0523 10/08/24 0839 10/08/24 0927 10/08/24 1235  BP: (!) 151/70  (!) 155/72 (!) 157/75  Pulse: 77  74 81  Resp: 16   19  Temp: (!) 97.5 F (36.4 C)   98.5 F (36.9 C)  TempSrc: Oral   Oral  SpO2: 95% 95%  96%  Weight:      Height:        Intake/Output Summary (Last 24 hours) at 10/08/2024 1302 Last data filed at 10/08/2024 0955 Gross per 24 hour  Intake 1983.78 ml  Output 1600 ml  Net 383.78 ml   Filed Weights   10/07/24 1045  Weight: 66.7 kg   Examination:  General exam: Appears calm and comfortable  Respiratory system: Clear to  auscultation. Respiratory effort normal. Cardiovascular system: normal S1 & S2 heard. No JVD, murmurs, rubs, gallops or clicks. No pedal edema. Gastrointestinal system: Abdomen is nondistended, soft and nontender. No organomegaly or masses felt. Normal bowel sounds heard. Central nervous system: Alert and oriented. No focal neurological deficits. Extremities: Symmetric 5 x 5 power. Skin: No rashes, lesions or ulcers. Psychiatry: Judgement and insight appear normal. Mood & affect appropriate.   Data Reviewed: I have personally reviewed following labs and imaging studies  CBC: Recent Labs  Lab 10/07/24 1114 10/08/24 0317  WBC 21.6* 9.2  NEUTROABS 19.7* 7.0  HGB 10.2* 9.3*  HCT 31.3* 29.0*  MCV 85.5 84.5  PLT 234 221    Basic Metabolic Panel: Recent Labs  Lab 10/07/24 1114 10/08/24 0317  NA 132* 137  K 4.0 3.9  CL 97* 106  CO2 21* 21*  GLUCOSE 321* 197*  BUN 25* 18  CREATININE 1.28* 0.78  CALCIUM  9.6 9.6  MG  --  1.9    CBG: Recent Labs  Lab 10/07/24 1650 10/07/24 2203 10/08/24 0411 10/08/24 0753 10/08/24 1113  GLUCAP 281* 317* 173* 175* 339*    Recent Results (from the past 240 hours)  Resp panel by RT-PCR (RSV, Flu A&B, Covid) Anterior Nasal Swab     Status: None   Collection Time: 09/29/24 12:49 PM   Specimen: Anterior Nasal Swab  Result Value Ref Range Status   SARS Coronavirus 2 by RT PCR NEGATIVE NEGATIVE Final    Comment: (NOTE) SARS-CoV-2 target nucleic acids are NOT DETECTED.  The SARS-CoV-2 RNA is generally detectable in upper respiratory specimens during the acute phase of infection. The lowest concentration of SARS-CoV-2 viral copies this assay can detect is 138 copies/mL. A negative result does not preclude SARS-Cov-2 infection and should not be used as the sole basis for treatment or other patient management decisions. A negative result may occur with  improper specimen collection/handling, submission of specimen other than nasopharyngeal  swab, presence of viral mutation(s) within the areas targeted by this assay, and inadequate number of viral copies(<138 copies/mL). A negative result must be combined with clinical observations, patient history, and epidemiological information. The expected result is Negative.  Fact Sheet for Patients:  bloggercourse.com  Fact Sheet for Healthcare Providers:  seriousbroker.it  This test is no t yet approved or cleared by the [place]  FDA and  has been authorized for detection and/or diagnosis of SARS-CoV-2 by FDA under an Emergency Use Authorization (EUA). This EUA will remain  in effect (meaning this test can be used) for the duration of the COVID-19 declaration under Section 564(b)(1) of the Act, 21 U.S.C.section 360bbb-3(b)(1), unless the authorization is terminated  or revoked sooner.       Influenza A by PCR NEGATIVE NEGATIVE Final   Influenza B by PCR NEGATIVE NEGATIVE Final    Comment: (NOTE) The Xpert Xpress SARS-CoV-2/FLU/RSV plus assay is intended as  an aid in the diagnosis of influenza from Nasopharyngeal swab specimens and should not be used as a sole basis for treatment. Nasal washings and aspirates are unacceptable for Xpert Xpress SARS-CoV-2/FLU/RSV testing.  Fact Sheet for Patients: bloggercourse.com  Fact Sheet for Healthcare Providers: seriousbroker.it  This test is not yet approved or cleared by the [place]  FDA and has been authorized for detection and/or diagnosis of SARS-CoV-2 by FDA under an Emergency Use Authorization (EUA). This EUA will remain in effect (meaning this test can be used) for the duration of the COVID-19 declaration under Section 564(b)(1) of the Act, 21 U.S.C. section 360bbb-3(b)(1), unless the authorization is terminated or revoked.     Resp Syncytial Virus by PCR NEGATIVE NEGATIVE Final    Comment: (NOTE) Fact Sheet for  Patients: bloggercourse.com  Fact Sheet for Healthcare Providers: seriousbroker.it  This test is not yet approved or cleared by the [place]  FDA and has been authorized for detection and/or diagnosis of SARS-CoV-2 by FDA under an Emergency Use Authorization (EUA). This EUA will remain in effect (meaning this test can be used) for the duration of the COVID-19 declaration under Section 564(b)(1) of the Act, 21 U.S.C. section 360bbb-3(b)(1), unless the authorization is terminated or revoked.  Performed at [facility], [address], [place], [place] [address]   Culture, blood (routine x 2)     Status: None   Collection Time: 09/29/24 12:50 PM   Specimen: BLOOD  Result Value Ref Range Status   Specimen Description BLOOD BLOOD LEFT FOREARM  Final   Special Requests   Final    BOTTLES DRAWN AEROBIC AND ANAEROBIC Blood Culture adequate volume   Culture   Final    NO GROWTH 5 DAYS Performed at [facility], [address], [place], [place] [address]    Report Status 10/04/2024 FINAL  Final  Culture, blood (routine x 2)     Status: None   Collection Time: 09/29/24 12:55 PM   Specimen: BLOOD  Result Value Ref Range Status   Specimen Description BLOOD RIGHT ANTECUBITAL  Final   Special Requests   Final    BOTTLES DRAWN AEROBIC AND ANAEROBIC Blood Culture adequate volume   Culture   Final    NO GROWTH 5 DAYS Performed at [facility], [address] [place], [place] [address]    Report Status 10/04/2024 FINAL  Final  Urine Culture (for pregnant, neutropenic or urologic patients or patients with an indwelling urinary catheter)     Status: Abnormal   Collection Time: 09/29/24  5:16 PM   Specimen: Urine, Clean Catch  Result Value Ref Range Status   Specimen Description   Final    URINE, CLEAN CATCH Performed at [facility], [address], [place], [place] [address]    Special Requests   Final    NONE Performed at [facility], [address] [place], [place] [address]    Culture MULTIPLE SPECIES PRESENT, SUGGEST RECOLLECTION (A)  Final   Report Status 10/01/2024 FINAL  Final  Resp panel by RT-PCR (RSV, Flu A&B, Covid) Anterior Nasal Swab     Status: None   Collection Time: 10/07/24 12:01 PM   Specimen: Anterior Nasal Swab  Result Value Ref Range Status   SARS Coronavirus 2 by RT PCR NEGATIVE NEGATIVE Final    Comment: (NOTE) SARS-CoV-2 target nucleic acids are NOT DETECTED.  The SARS-CoV-2 RNA is generally detectable in upper respiratory specimens during the acute phase of infection. The lowest concentration of SARS-CoV-2 viral copies  this assay can detect is 138 copies/mL. A negative result does not preclude SARS-Cov-2 infection and should not be used as the sole basis for treatment or other patient management decisions. A negative result may occur with  improper specimen collection/handling, submission of specimen other than nasopharyngeal swab, presence of viral mutation(s) within the areas targeted by this assay, and inadequate number of viral copies(<138 copies/mL). A negative result must be combined with clinical observations, patient history, and epidemiological information. The expected result is Negative.  Fact Sheet for Patients:  bloggercourse.com  Fact Sheet for Healthcare Providers:  seriousbroker.it  This test is no t yet approved or cleared by the [place]  FDA and  has been authorized for detection and/or diagnosis of SARS-CoV-2 by FDA under an Emergency Use Authorization (EUA). This EUA will remain  in effect (meaning this test can be used) for the duration of the COVID-19 declaration under Section 564(b)(1) of the Act, 21 U.S.C.section 360bbb-3(b)(1), unless the authorization is terminated  or revoked sooner.       Influenza A by PCR NEGATIVE NEGATIVE Final   Influenza B by PCR NEGATIVE NEGATIVE Final    Comment:  (NOTE) The Xpert Xpress SARS-CoV-2/FLU/RSV plus assay is intended as an aid in the diagnosis of influenza from Nasopharyngeal swab specimens and should not be used as a sole basis for treatment. Nasal washings and aspirates are unacceptable for Xpert Xpress SARS-CoV-2/FLU/RSV testing.  Fact Sheet for Patients: bloggercourse.com  Fact Sheet for Healthcare Providers: seriousbroker.it  This test is not yet approved or cleared by the [place]  FDA and has been authorized for detection and/or diagnosis of SARS-CoV-2 by FDA under an Emergency Use Authorization (EUA). This EUA will remain in effect (meaning this test can be used) for the duration of the COVID-19 declaration under Section 564(b)(1) of the Act, 21 U.S.C. section 360bbb-3(b)(1), unless the authorization is terminated or revoked.     Resp Syncytial Virus by PCR NEGATIVE NEGATIVE Final    Comment: (NOTE) Fact Sheet for Patients: bloggercourse.com  Fact Sheet for Healthcare Providers: seriousbroker.it  This test is not yet approved or cleared by the [place]  FDA and has been authorized for detection and/or diagnosis of SARS-CoV-2 by FDA under an Emergency Use Authorization (EUA). This EUA will remain in effect (meaning this test can be used) for the duration of the COVID-19 declaration under Section 564(b)(1) of the Act, 21 U.S.C. section 360bbb-3(b)(1), unless the authorization is terminated or revoked.  Performed at [facility], [address], [place], [place] [address]   Culture, blood (single)     Status: None (Preliminary result)   Collection Time: 10/07/24  2:03 PM   Specimen: BLOOD  Result Value Ref Range Status   Specimen Description BLOOD RIGHT ASSIST CONTROL  Final   Special Requests   Final    BOTTLES DRAWN AEROBIC ONLY Blood Culture adequate volume   Culture   Final    NO GROWTH < 24  HOURS Performed at [facility], [address], [place], [place] [address]    Report Status PENDING  Incomplete     Radiology Studies: DG Chest 2 View Result Date: 10/07/2024 EXAM: 2 VIEW(S) XRAY OF THE CHEST 10/07/2024 11:12:00 AM COMPARISON: Prior studies are available. CLINICAL HISTORY: Weakness. FINDINGS: LUNGS AND PLEURA: Minimal left basal scarring or subsegmental atelectasis is noted. Small pleural effusion. No pulmonary edema. No pneumothorax. HEART AND MEDIASTINUM: No acute abnormality of the cardiac and mediastinal silhouettes. BONES AND SOFT TISSUES: No acute osseous abnormality. IMPRESSION: 1. Minimal  left basal scarring or subsegmental atelectasis. 2. Small left pleural effusion. Electronically signed by: Lynwood Seip MD 10/07/2024 11:50 AM EST RP Workstation: [id number]    Scheduled Meds:  ALPRAZolam   0.5 mg Oral QHS   amoxicillin -clavulanate  1 tablet Oral BID   aspirin  EC  81 mg Oral Daily   atorvastatin   80 mg Oral QPM   budesonide-glycopyrrolate-formoterol  2 puff Inhalation BID   insulin  aspart  0-5 Units Subcutaneous QHS   insulin  aspart  0-9 Units Subcutaneous TID WC   insulin  aspart  8 Units Subcutaneous TID WC   insulin  glargine-yfgn  25 Units Subcutaneous QHS   levothyroxine   125 mcg Oral Daily   metoprolol  succinate  25 mg Oral Daily   mirabegron ER  25 mg Oral Daily   pantoprazole   40 mg Oral Daily   saccharomyces boulardii  250 mg Oral BID   ticagrelor   60 mg Oral BID   Continuous Infusions:  lactated ringers  30 mL/hr at 10/08/24 0930     LOS: 1 day   Time spent: 55 mins  Cyndel Griffey Vicci, MD How to contact the [facility] Attending or Consulting provider 7A - 7P or covering provider during after hours 7P -7A, for this patient?  Check the care team in [facility] and look for a) attending/consulting [facility] provider listed and b) the [facility] team listed Log into [email or web address] to find provider on call.  Locate the [facility] provider you are looking for under [facility] and  page to a number that you can be directly reached. If you still have difficulty reaching the provider, please page the [facility] (Director on Call) for the Hospitalists listed on amion for assistance.  10/08/2024, 1:02 PM

## 2024-10-09 DIAGNOSIS — D72829 Elevated white blood cell count, unspecified: Secondary | ICD-10-CM | POA: Diagnosis not present

## 2024-10-09 DIAGNOSIS — E872 Acidosis, unspecified: Secondary | ICD-10-CM | POA: Diagnosis not present

## 2024-10-09 DIAGNOSIS — N179 Acute kidney failure, unspecified: Secondary | ICD-10-CM | POA: Diagnosis not present

## 2024-10-09 DIAGNOSIS — R627 Adult failure to thrive: Secondary | ICD-10-CM | POA: Diagnosis not present

## 2024-10-09 DIAGNOSIS — K219 Gastro-esophageal reflux disease without esophagitis: Secondary | ICD-10-CM | POA: Diagnosis not present

## 2024-10-09 DIAGNOSIS — Q6239 Other obstructive defects of renal pelvis and ureter: Secondary | ICD-10-CM | POA: Diagnosis not present

## 2024-10-09 DIAGNOSIS — E1165 Type 2 diabetes mellitus with hyperglycemia: Secondary | ICD-10-CM | POA: Diagnosis not present

## 2024-10-09 DIAGNOSIS — E039 Hypothyroidism, unspecified: Secondary | ICD-10-CM | POA: Diagnosis not present

## 2024-10-09 LAB — GLUCOSE, CAPILLARY
Glucose-Capillary: 228 mg/dL — ABNORMAL HIGH (ref 70–99)
Glucose-Capillary: 328 mg/dL — ABNORMAL HIGH (ref 70–99)
Glucose-Capillary: 313 mg/dL — ABNORMAL HIGH (ref 70–99)
Glucose-Capillary: 216 mg/dL — ABNORMAL HIGH (ref 70–99)
Glucose-Capillary: 125 mg/dL — ABNORMAL HIGH (ref 70–99)

## 2024-10-09 LAB — BASIC METABOLIC PANEL WITH GFR
Anion gap: 12 (ref 5–15)
BUN: 14 mg/dL (ref 8–23)
CO2: 22 mmol/L (ref 22–32)
Calcium: 9.7 mg/dL (ref 8.9–10.3)
Chloride: 102 mmol/L (ref 98–111)
Creatinine, Ser: 0.74 mg/dL (ref 0.44–1.00)
GFR, Estimated: >60 mL/min (ref >60)
Glucose, Bld: 253 mg/dL — ABNORMAL HIGH (ref 70–99)
Potassium: 4 mmol/L (ref 3.5–5.1)
Sodium: 136 mmol/L (ref 135–145)

## 2024-10-09 LAB — MAGNESIUM: Magnesium: 2 mg/dL (ref 1.7–2.4)

## 2024-10-09 MED ORDER — INSULIN ASPART 100 UNIT/ML IJ SOLN
10.0000 [IU] | Freq: Three times a day (TID) | INTRAMUSCULAR | Status: DC
  Administered 2024-10-09: 10 [IU] via SUBCUTANEOUS
  Filled 2024-10-09: qty 1

## 2024-10-09 MED ORDER — INSULIN ASPART 100 UNIT/ML IJ SOLN
0.0000 [IU] | Freq: Three times a day (TID) | INTRAMUSCULAR | Status: DC
  Administered 2024-10-09: 5 [IU] via SUBCUTANEOUS
  Administered 2024-10-09: 11 [IU] via SUBCUTANEOUS
  Administered 2024-10-10: 5 [IU] via SUBCUTANEOUS
  Administered 2024-10-10: 15 [IU] via SUBCUTANEOUS
  Administered 2024-10-11 (×2): 3 [IU] via SUBCUTANEOUS
  Filled 2024-10-09 (×7): qty 1

## 2024-10-09 MED ORDER — INSULIN ASPART 100 UNIT/ML IJ SOLN
12.0000 [IU] | Freq: Three times a day (TID) | INTRAMUSCULAR | Status: DC
  Administered 2024-10-09 (×2): 12 [IU] via SUBCUTANEOUS
  Filled 2024-10-09 (×3): qty 1

## 2024-10-09 MED ORDER — INSULIN ASPART 100 UNIT/ML IJ SOLN
0.0000 [IU] | Freq: Every day | INTRAMUSCULAR | Status: DC
  Administered 2024-10-10: 4 [IU] via SUBCUTANEOUS
  Filled 2024-10-09: qty 1

## 2024-10-09 MED ORDER — INSULIN GLARGINE-YFGN 100 UNIT/ML ~~LOC~~ SOLN
36.0000 [IU] | Freq: Every day | SUBCUTANEOUS | Status: DC
  Administered 2024-10-09: 36 [IU] via SUBCUTANEOUS
  Filled 2024-10-09 (×2): qty 0.36

## 2024-10-09 MED ORDER — LACTATED RINGERS IV SOLN: INTRAVENOUS | Status: DC

## 2024-10-09 MED ORDER — CHLORHEXIDINE GLUCONATE CLOTH 2 % EX PADS
6.0000 | MEDICATED_PAD | Freq: Every day | CUTANEOUS | Status: DC
  Administered 2024-10-11: 6 via TOPICAL

## 2024-10-09 MED ORDER — INSULIN GLARGINE-YFGN 100 UNIT/ML ~~LOC~~ SOLN
30.0000 [IU] | Freq: Every day | SUBCUTANEOUS | Status: DC
  Filled 2024-10-09: qty 0.3

## 2024-10-09 NOTE — Progress Notes (Signed)
 Mobility Specialist Progress Note:    10/09/24 1250  Mobility  Activity Pivoted/transferred to/from [facility]  Level of Assistance Minimal assist, patient does 75% or more  Assistive Device None  Distance Ambulated (ft) 3 ft  Range of Motion/Exercises Active;All extremities  Activity Response Tolerated well  Mobility Referral Yes  Mobility visit 1 Mobility  Mobility Specialist Start Time (ACUTE ONLY) 1250  Mobility Specialist Stop Time (ACUTE ONLY) 1306  Mobility Specialist Time Calculation (min) (ACUTE ONLY) 16 min   Pt received in bed, Requesting assistance to [facility]. Required MinA to stand and transfer with no AD. Tolerated well, asx throughout. NT in room, all needs met.  Gabrielle Wood Mobility Specialist Please contact via Special Educational Needs Teacher or  Rehab office at [phone]

## 2024-10-09 NOTE — Plan of Care (Signed)
°  Problem: Education: °Goal: Knowledge of General Education information will improve °Description: Including pain rating scale, medication(s)/side effects and non-pharmacologic comfort measures °Outcome: Progressing °  °Problem: Clinical Measurements: °Goal: Ability to maintain clinical measurements within normal limits will improve °Outcome: Progressing °  °Problem: Clinical Measurements: °Goal: Diagnostic test results will improve °Outcome: Progressing °  °Problem: Coping: °Goal: Level of anxiety will decrease °Outcome: Progressing °  °

## 2024-10-09 NOTE — TOC Initial Note (Signed)
 Transition of Care [facility]) - Initial/Assessment Note    Patient Details  Name: Gabrielle Wood MRN: [id number] Date of Birth: 06-20-1946  [facility]) CM/SW Contact:    Lucie Lunger, LCSWA Phone Number: 10/09/2024, 10:19 AM  Clinical Narrative:                 Pt is high risk for readmission. CSW spoke with pt at bedside to complete assessment. Pt is from home with family. Pt states that she has needed DME in the home. Pt recently set up with [facility] services. CSW spoke with pt about HH vs SNF. Pt states that she is not interested in SNF and will be returning home with [facility] when medically stable. CSW updated Shelia with [facility] that [facility] orders have been placed and pt will likely D/C from hospital tomorrow. TOC to follow.   Expected Discharge Plan: Home w Home Health Services Barriers to Discharge: Continued Medical Work up   Patient Goals and CMS Choice Patient states their goals for this hospitalization and ongoing recovery are:: return home CMS Medicare.gov Compare Post Acute Care list provided to:: Patient Represenative (must comment) Choice offered to / list presented to : Adult Children      Expected Discharge Plan and Services In-house Referral: Clinical Social Work Discharge Planning Services: CM Consult Post Acute Care Choice: Home Health, Resumption of Svcs/PTA Provider Living arrangements for the past 2 months: Single Family Home                           HH Arranged: PT HH Agency: [facility] Date [facility] Agency Contacted: 10/09/24   Representative spoke with at [facility] Agency: Holli  Prior Living Arrangements/Services Living arrangements for the past 2 months: Single Family Home Lives with:: Adult Children Patient language and need for interpreter reviewed:: Yes Do you feel safe going back to the place where you live?: Yes      Need for Family Participation in Patient Care: Yes (Comment) Care giver support system in place?: Yes  (comment) Current home services: DME, Home PT Criminal Activity/Legal Involvement Pertinent to Current Situation/Hospitalization: No - Comment as needed  Activities of Daily Living   ADL Screening (condition at time of admission) Independently performs ADLs?: No Does the patient have a NEW difficulty with bathing/dressing/toileting/self-feeding that is expected to last >3 days?: No Does the patient have a NEW difficulty with getting in/out of bed, walking, or climbing stairs that is expected to last >3 days?: No Does the patient have a NEW difficulty with communication that is expected to last >3 days?: No Is the patient deaf or have difficulty hearing?: Yes Does the patient have difficulty seeing, even when wearing glasses/contacts?: No Does the patient have difficulty concentrating, remembering, or making decisions?: No  Permission Sought/Granted                  Emotional Assessment Appearance:: Appears stated age Attitude/Demeanor/Rapport: Engaged Affect (typically observed): Accepting Orientation: : Oriented to Self, Oriented to Place, Oriented to  Time, Oriented to Situation Alcohol  / Substance Use: Not Applicable Psych Involvement: No (comment)  Admission diagnosis:  AKI (acute kidney injury) [N17.9] Sepsis with acute renal failure and septic shock, due to unspecified organism, unspecified acute renal failure type (HCC) [A41.9, R65.21, N17.9] Patient Active Problem List   Diagnosis Date Noted   Uncontrolled type 2 diabetes mellitus with hyperglycemia (HCC) 10/07/2024   Leukocytosis 10/07/2024   Lactic acidosis 10/07/2024  Normocytic anemia 10/07/2024   Failure to thrive in adult 10/07/2024   UPJ obstruction, 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   AKI (acute kidney injury) 09/29/2024   Sepsis (HCC) 08/15/2024   Anxiety 05/28/2023   Vitamin D deficiency 01/31/2022   Pressure injury of skin 10/16/2021   DKA (diabetic ketoacidosis) (HCC) 10/14/2021   Hypomagnesemia 10/14/2021    Hypoalbuminemia 10/14/2021   CAP (community acquired pneumonia) 10/14/2021   Renal cyst 10/02/2021   History of colonic polyps 08/27/2018   Taking multiple medications for chronic disease 08/27/2018   Type 2 diabetes mellitus with hypoglycemia without coma, with long-term current use of insulin  (HCC) 09/14/2015   Tobacco abuse counseling 09/14/2015   Rheumatoid arthritis (HCC) 06/08/2015   Diabetes mellitus (HCC) 06/08/2015   Gastritis and duodenitis 04/05/2012   Pancreatitis, acute 04/03/2012   Mixed hyperlipidemia 04/03/2012   Benign hypertension 04/03/2012   Hypothyroidism 04/03/2012   GERD (gastroesophageal reflux disease) 04/03/2012   PCP:  [name], MD Pharmacy:   [facility], [place] - [address]  [place] [address] Phone: [phone] Fax: [phone]     Social Drivers of Health (SDOH) Social History: SDOH Screenings   Food Insecurity: No Food Insecurity (10/07/2024)  Housing: Low Risk  (10/07/2024)  Transportation Needs: No Transportation Needs (10/07/2024)  Utilities: Not At Risk (10/07/2024)  Depression (PHQ2-9): High Risk (11/01/2021)  Financial Resource Strain: Low Risk  (08/11/2024)   Received from [facility]  Physical Activity: Inactive (08/11/2024)   Received from [facility]  Social Connections: Socially Isolated (10/07/2024)  Stress: No Stress Concern Present (08/11/2024)   Received from [facility]  Tobacco Use: Medium Risk (10/07/2024)  Health Literacy: Medium Risk (08/11/2024)   Received from [facility]   SDOH Interventions:     Readmission Risk Interventions    10/09/2024   10:17 AM 10/02/2024   10:53 AM 08/19/2024    9:21 AM  Readmission Risk Prevention Plan  Post Dischage Appt   Complete  Medication Screening   Complete  Transportation Screening Complete Complete Complete  Home Care Screening Complete Complete   Medication Review (RN CM) Complete Complete

## 2024-10-09 NOTE — Progress Notes (Signed)
 Mobility Specialist Progress Note:    10/09/24 0900  Mobility  Activity Ambulated with assistance  Level of Assistance Minimal assist, patient does 75% or more  Assistive Device Front wheel walker  Distance Ambulated (ft) 35 ft  Range of Motion/Exercises Active;All extremities  Activity Response Tolerated well  Mobility Referral Yes  Mobility visit 1 Mobility  Mobility Specialist Start Time (ACUTE ONLY) 0900  Mobility Specialist Stop Time (ACUTE ONLY) 0920  Mobility Specialist Time Calculation (min) (ACUTE ONLY) 20 min   Pt received in bed, NT in room. Agreeable to mobility, required MinA to stand, and SBA to ambulate with RW. Tolerated well, asx throughout. Left on BSC, NT in room. All needs met.  Daveyon Kitchings Mobility Specialist Please contact via Special Educational Needs Teacher or  Rehab office at [phone]

## 2024-10-09 NOTE — Care Management Obs Status (Signed)
 MEDICARE OBSERVATION STATUS NOTIFICATION   Patient Details  Name: Gabrielle Wood MRN: [id number] Date of Birth: [birth year]   Medicare Observation Status Notification Given:  Yes    Gabrielle Wood 10/09/2024, 8:37 AM

## 2024-10-09 NOTE — Plan of Care (Signed)

## 2024-10-09 NOTE — Progress Notes (Signed)
 PT Cancellation Note  Patient Details Name: Gabrielle Wood MRN: [id number] DOB: June 29, 1946   Cancelled Treatment:    Reason Eval/Treat Not Completed: Pain limiting ability to participate. Patient declined therapy due to c/o severe headache - RN aware.   3:53 PM, 10/09/24 Lynwood Music, MPT Physical Therapist with [facility] [phone] office [phone] mobile phone

## 2024-10-09 NOTE — Progress Notes (Signed)
 Pt had episode of incontinence. Bladder scanned per order: retaining 800 mL. In and out cath patient: 1100 clear yellow urine removed. Provider notified.

## 2024-10-09 NOTE — Progress Notes (Signed)
 PROGRESS NOTE   Gabrielle Wood  FMW:[id number] DOB: December 14, 1945 DOA: 10/07/2024 PCP: [name], MD   Chief Complaint  Patient presents with   Weakness   Level of care: Med-Surg  Brief Admission History:  78 year old female with recent failure to thrive, hypothyroidism, uncontrolled type 2 diabetes mellitus, hypertension, generalized weakness and debility, recently discovered chronic right UPJ obstruction, CAD status post NSTEMI in 2022 status post PCI DES to the LCx, history of DVT, GERD, hyperlipidemia, COPD, OA, recent hospitalization at [facility] for aspiration pneumonia, severe sepsis, acute metabolic encephalopathy, AKI who apparently initially had improved and was subsequently discharged home with home health services.  She is returning to the ED today by POV with a family member reporting that patient has been weak since being discharged from home and having difficulty ambulating with poor oral intake.  He has been having generalized weakness and a persistent cough.  She was found to be dehydrated with a blood glucose of 293 and an AKI with a creatinine of 1.28, WBC 21.6, lactic acid 2.4.  All of her viral testing has been negative.  She was hypotensive on arrival with a blood pressure of 84/54, MAP 64, pulse ox 92% on room air.  Fortunately her blood pressures improved after IV fluid bolus was given in the ED.  She was empirically started on IV antibiotics.  Admission was requested for further management.   Assessment and Plan:  Dehydration with AKI -- RESOLVED  - likely precipitated by hyperglycemia - BS >300 on presentation  - IV fluids ordered - working on glycemic control   Adult Failure to thrive - multiple hospitalizations in past year at least monthly at different facilities - palliative care consulted at last visit - she is DNR / DNI  - she had wanted to treat the treatable  - pt adamant she will NOT go to SNF - PT/OT eval requested -- home health recommended  - TOC eval  requested    Chronic right UPJ obstruction - she recently saw Dr. Sherrilee in hospital - see consult note - he had recommended observation and outpatient follow up  - bladder scans every 4 hours ordered  - cath PRN if unable to void   Acute Urinary Retention - pt still retaining significant urine after 24 hours of I/O cath - foley catheter placed on 10/09/24  - monitor urinary flow  - follow up with Dr. Sherrilee urology with [facility] [place]  - DC myrbetriq    Hypothyroidism  - resumed home levothyroxine    Uncontrolled type 2 DM with hyperglycemia and vascular complications - A1c>9 - increase basal lantus  to 36 units and increasing novolog  to 12 units TID with meal plus supplemental SSI coverage - frequent CBG monitoring ordered CBG (last 3)  Recent Labs    10/09/24 0259 10/09/24 0714 10/09/24 1122  GLUCAP 228* 328* 313*    GERD - pantoprazole  ordered for GI protection   Lactic acidosis--RESOLVED - secondary to dehydration  - sepsis ruled out - lactic acidosis resolved now   DNR/DNI - continue DNR orders in hospital    Leukocytosis--RESOLVED  - suspect reactive - she has been on antibiotic treatment for recent aspiration pneumonia - recheck CBC/diff in AM  - add on procalcitonin    Aspiration pneumonia - aspiration precautions - complete course of oral augmentin  (probiotics added)   Essential hypertension  - resumed home meds  DVT prophylaxis: enoxaparin   Code Status: DNR  Family Communication:  Disposition: declining SNF, planning home  with [facility] tomorrow if stable    Consultants:   Procedures:   Antimicrobials:    Subjective: Pt reports she absolutely refuses to go to SNF.    Objective: Vitals:   10/09/24 0454 10/09/24 0844 10/09/24 0859 10/09/24 1323  BP: (!) 155/88 (!) 178/84  136/78  Pulse: 73 80  77  Resp: 18   18  Temp: 97.8 F (36.6 C)   98.3 F (36.8 C)  TempSrc: Oral   Oral  SpO2: 98%  96% 95%  Weight:      Height:         Intake/Output Summary (Last 24 hours) at 10/09/2024 1548 Last data filed at 10/09/2024 0932 Gross per 24 hour  Intake 120 ml  Output 3300 ml  Net -3180 ml   Filed Weights   10/07/24 1045  Weight: 66.7 kg   Examination:  General exam: Appears calm and comfortable  Respiratory system: Clear to auscultation. Respiratory effort normal. Cardiovascular system: normal S1 & S2 heard. No JVD, murmurs, rubs, gallops or clicks. No pedal edema. Gastrointestinal system: Abdomen is nondistended, soft and nontender. No organomegaly or masses felt. Normal bowel sounds heard. Central nervous system: Alert and oriented. No focal neurological deficits. Extremities: Symmetric 5 x 5 power. Skin: No rashes, lesions or ulcers. Psychiatry: Judgement and insight appear normal. Mood & affect appropriate.   Data Reviewed: I have personally reviewed following labs and imaging studies  CBC: Recent Labs  Lab 10/07/24 1114 10/08/24 0317  WBC 21.6* 9.2  NEUTROABS 19.7* 7.0  HGB 10.2* 9.3*  HCT 31.3* 29.0*  MCV 85.5 84.5  PLT 234 221    Basic Metabolic Panel: Recent Labs  Lab 10/07/24 1114 10/08/24 0317 10/09/24 0352  NA 132* 137 136  K 4.0 3.9 4.0  CL 97* 106 102  CO2 21* 21* 22  GLUCOSE 321* 197* 253*  BUN 25* 18 14  CREATININE 1.28* 0.78 0.74  CALCIUM  9.6 9.6 9.7  MG  --  1.9 2.0    CBG: Recent Labs  Lab 10/08/24 1618 10/08/24 1929 10/09/24 0259 10/09/24 0714 10/09/24 1122  GLUCAP 286* 248* 228* 328* 313*    Recent Results (from the past 240 hours)  Urine Culture (for pregnant, neutropenic or urologic patients or patients with an indwelling urinary catheter)     Status: Abnormal   Collection Time: 09/29/24  5:16 PM   Specimen: Urine, Clean Catch  Result Value Ref Range Status   Specimen Description   Final    URINE, CLEAN CATCH Performed at [facility], [address] [place], [place] [address]    Special Requests   Final    NONE Performed at [facility],  [address] [place], [place] [address]    Culture MULTIPLE SPECIES PRESENT, SUGGEST RECOLLECTION (A)  Final   Report Status 10/01/2024 FINAL  Final  Resp panel by RT-PCR (RSV, Flu A&B, Covid) Anterior Nasal Swab     Status: None   Collection Time: 10/07/24 12:01 PM   Specimen: Anterior Nasal Swab  Result Value Ref Range Status   SARS Coronavirus 2 by RT PCR NEGATIVE NEGATIVE Final    Comment: (NOTE) SARS-CoV-2 target nucleic acids are NOT DETECTED.  The SARS-CoV-2 RNA is generally detectable in upper respiratory specimens during the acute phase of infection. The lowest concentration of SARS-CoV-2 viral copies this assay can detect is 138 copies/mL. A negative result does not preclude SARS-Cov-2 infection and should not be used as the sole basis for treatment or other patient management decisions.  A negative result may occur with  improper specimen collection/handling, submission of specimen other than nasopharyngeal swab, presence of viral mutation(s) within the areas targeted by this assay, and inadequate number of viral copies(<138 copies/mL). A negative result must be combined with clinical observations, patient history, and epidemiological information. The expected result is Negative.  Fact Sheet for Patients:  bloggercourse.com  Fact Sheet for Healthcare Providers:  seriousbroker.it  This test is no t yet approved or cleared by the [place]  FDA and  has been authorized for detection and/or diagnosis of SARS-CoV-2 by FDA under an Emergency Use Authorization (EUA). This EUA will remain  in effect (meaning this test can be used) for the duration of the COVID-19 declaration under Section 564(b)(1) of the Act, 21 U.S.C.section 360bbb-3(b)(1), unless the authorization is terminated  or revoked sooner.       Influenza A by PCR NEGATIVE NEGATIVE Final   Influenza B by PCR NEGATIVE NEGATIVE Final    Comment: (NOTE) The Xpert  Xpress SARS-CoV-2/FLU/RSV plus assay is intended as an aid in the diagnosis of influenza from Nasopharyngeal swab specimens and should not be used as a sole basis for treatment. Nasal washings and aspirates are unacceptable for Xpert Xpress SARS-CoV-2/FLU/RSV testing.  Fact Sheet for Patients: bloggercourse.com  Fact Sheet for Healthcare Providers: seriousbroker.it  This test is not yet approved or cleared by the [place]  FDA and has been authorized for detection and/or diagnosis of SARS-CoV-2 by FDA under an Emergency Use Authorization (EUA). This EUA will remain in effect (meaning this test can be used) for the duration of the COVID-19 declaration under Section 564(b)(1) of the Act, 21 U.S.C. section 360bbb-3(b)(1), unless the authorization is terminated or revoked.     Resp Syncytial Virus by PCR NEGATIVE NEGATIVE Final    Comment: (NOTE) Fact Sheet for Patients: bloggercourse.com  Fact Sheet for Healthcare Providers: seriousbroker.it  This test is not yet approved or cleared by the [place]  FDA and has been authorized for detection and/or diagnosis of SARS-CoV-2 by FDA under an Emergency Use Authorization (EUA). This EUA will remain in effect (meaning this test can be used) for the duration of the COVID-19 declaration under Section 564(b)(1) of the Act, 21 U.S.C. section 360bbb-3(b)(1), unless the authorization is terminated or revoked.  Performed at [facility], [address], [place], [place] [address]   Culture, blood (single)     Status: None (Preliminary result)   Collection Time: 10/07/24  2:03 PM   Specimen: BLOOD  Result Value Ref Range Status   Specimen Description BLOOD RIGHT ASSIST CONTROL  Final   Special Requests   Final    BOTTLES DRAWN AEROBIC ONLY Blood Culture adequate volume   Culture   Final    NO GROWTH 2 DAYS Performed at [facility], [address] [place], [place] [address]    Report Status PENDING  Incomplete     Radiology Studies: No results found.   Scheduled Meds:  ALPRAZolam   0.5 mg Oral QHS   amoxicillin -clavulanate  1 tablet Oral BID   aspirin  EC  81 mg Oral Daily   atorvastatin   80 mg Oral QPM   budesonide-glycopyrrolate-formoterol  2 puff Inhalation BID   Chlorhexidine  Gluconate Cloth  6 each Topical Daily   insulin  aspart  0-15 Units Subcutaneous TID WC   insulin  aspart  0-5 Units Subcutaneous QHS   insulin  aspart  12 Units Subcutaneous TID WC   insulin  glargine-yfgn  36 Units Subcutaneous QHS   levothyroxine   125 mcg  Oral Daily   metoprolol  succinate  25 mg Oral Daily   pantoprazole   40 mg Oral Daily   saccharomyces boulardii  250 mg Oral BID   ticagrelor   60 mg Oral BID   Continuous Infusions:  lactated ringers  30 mL/hr at 10/09/24 0906     LOS: 1 day   Time spent: 55 mins  Obed Samek Vicci, MD How to contact the [facility] Attending or Consulting provider 7A - 7P or covering provider during after hours 7P -7A, for this patient?  Check the care team in [facility] and look for a) attending/consulting TRH provider listed and b) the TRH team listed Log into [email or web address] to find provider on call.  Locate the TRH provider you are looking for under [facility] and page to a number that you can be directly reached. If you still have difficulty reaching the provider, please page the [facility] (Director on Call) for the Hospitalists listed on amion for assistance.  10/09/2024, 3:48 PM

## 2024-10-09 NOTE — Care Management CC44 (Signed)
 Condition Code 44 Documentation Completed  Patient Details  Name: Gabrielle Wood MRN: [id number] Date of Birth: 1946/06/05   Condition Code 44 given:  Yes Patient signature on Condition Code 44 notice:  Yes Documentation of 2 MD's agreement:  Yes Code 44 added to claim:  Yes    [name], LCSWA 10/09/2024, 8:37 AM

## 2024-10-10 DIAGNOSIS — E039 Hypothyroidism, unspecified: Secondary | ICD-10-CM | POA: Diagnosis not present

## 2024-10-10 DIAGNOSIS — E1165 Type 2 diabetes mellitus with hyperglycemia: Secondary | ICD-10-CM | POA: Diagnosis not present

## 2024-10-10 DIAGNOSIS — Q6239 Other obstructive defects of renal pelvis and ureter: Secondary | ICD-10-CM | POA: Diagnosis not present

## 2024-10-10 DIAGNOSIS — D72829 Elevated white blood cell count, unspecified: Secondary | ICD-10-CM | POA: Diagnosis not present

## 2024-10-10 DIAGNOSIS — E872 Acidosis, unspecified: Secondary | ICD-10-CM | POA: Diagnosis not present

## 2024-10-10 DIAGNOSIS — K219 Gastro-esophageal reflux disease without esophagitis: Secondary | ICD-10-CM | POA: Diagnosis not present

## 2024-10-10 DIAGNOSIS — R627 Adult failure to thrive: Secondary | ICD-10-CM | POA: Diagnosis not present

## 2024-10-10 DIAGNOSIS — N179 Acute kidney failure, unspecified: Secondary | ICD-10-CM | POA: Diagnosis not present

## 2024-10-10 LAB — GLUCOSE, CAPILLARY
Glucose-Capillary: 264 mg/dL — ABNORMAL HIGH (ref 70–99)
Glucose-Capillary: 379 mg/dL — ABNORMAL HIGH (ref 70–99)
Glucose-Capillary: 211 mg/dL — ABNORMAL HIGH (ref 70–99)
Glucose-Capillary: 69 mg/dL — ABNORMAL LOW (ref 70–99)
Glucose-Capillary: 75 mg/dL (ref 70–99)
Glucose-Capillary: 318 mg/dL — ABNORMAL HIGH (ref 70–99)

## 2024-10-10 MED ORDER — INSULIN GLARGINE-YFGN 100 UNIT/ML ~~LOC~~ SOLN
45.0000 [IU] | Freq: Every day | SUBCUTANEOUS | Status: DC
  Filled 2024-10-10: qty 0.45

## 2024-10-10 MED ORDER — INSULIN ASPART 100 UNIT/ML IJ SOLN
12.0000 [IU] | Freq: Three times a day (TID) | INTRAMUSCULAR | Status: DC
  Administered 2024-10-11 (×2): 12 [IU] via SUBCUTANEOUS
  Filled 2024-10-10 (×2): qty 1

## 2024-10-10 MED ORDER — INSULIN ASPART 100 UNIT/ML IJ SOLN
14.0000 [IU] | Freq: Three times a day (TID) | INTRAMUSCULAR | Status: DC
  Administered 2024-10-10 (×2): 14 [IU] via SUBCUTANEOUS
  Filled 2024-10-10 (×2): qty 1

## 2024-10-10 MED ORDER — INSULIN GLARGINE-YFGN 100 UNIT/ML ~~LOC~~ SOLN
40.0000 [IU] | Freq: Every day | SUBCUTANEOUS | Status: DC
  Administered 2024-10-10: 40 [IU] via SUBCUTANEOUS
  Filled 2024-10-10 (×2): qty 0.4

## 2024-10-10 MED ORDER — TAMSULOSIN HCL 0.4 MG PO CAPS
0.4000 mg | ORAL_CAPSULE | Freq: Every day | ORAL | Status: DC
  Administered 2024-10-10: 0.4 mg via ORAL
  Filled 2024-10-10: qty 1

## 2024-10-10 MED ORDER — INSULIN GLARGINE-YFGN 100 UNIT/ML ~~LOC~~ SOLN: 40.0000 [IU] | Freq: Every day | SUBCUTANEOUS | Status: DC

## 2024-10-10 NOTE — Progress Notes (Signed)
 PROGRESS NOTE   Gabrielle Wood  FMW:[id number] DOB: 26-Feb-1946 DOA: 10/07/2024 PCP: [name], MD   Chief Complaint  Patient presents with   Weakness   Level of care: Med-Surg  Brief Admission History:  78 year old female with recent failure to thrive, hypothyroidism, uncontrolled type 2 diabetes mellitus, hypertension, generalized weakness and debility, recently discovered chronic right UPJ obstruction, CAD status post NSTEMI in 2022 status post PCI DES to the LCx, history of DVT, GERD, hyperlipidemia, COPD, OA, recent hospitalization at [facility] for aspiration pneumonia, severe sepsis, acute metabolic encephalopathy, AKI who apparently initially had improved and was subsequently discharged home with home health services.  She is returning to the ED today by POV with a family member reporting that patient has been weak since being discharged from home and having difficulty ambulating with poor oral intake.  He has been having generalized weakness and a persistent cough.  She was found to be dehydrated with a blood glucose of 293 and an AKI with a creatinine of 1.28, WBC 21.6, lactic acid 2.4.  All of her viral testing has been negative.  She was hypotensive on arrival with a blood pressure of 84/54, MAP 64, pulse ox 92% on room air.  Fortunately her blood pressures improved after IV fluid bolus was given in the ED.  She was empirically started on IV antibiotics.  Admission was requested for further management.   Assessment and Plan:  Dehydration with AKI -- RESOLVED  - likely precipitated by hyperglycemia - BS >300 on presentation  - IV fluids ordered - working on glycemic control - has been difficult   Adult Failure to thrive - multiple hospitalizations in past year at least monthly at different facilities - palliative care consulted at last visit - she is DNR / DNI  - she had wanted to treat the treatable  - pt adamant she will NOT go to SNF - PT/OT eval requested -- home health  recommended  - TOC eval requested -- plan is to return home tomorrow with son   Chronic right UPJ obstruction - she recently saw Dr. Sherrilee in hospital - see consult note - he had recommended observation and outpatient follow up  - bladder scans every 4 hours ordered  - cath PRN if unable to void   Acute Urinary Retention - pt still retaining significant urine after 24 hours of I/O cath - foley catheter placed on 10/10/24  - monitor urinary flow  - follow up with Dr. Sherrilee urology with [facility] [place]  - DC myrbetriq  - tamsulosin 0.4 mg with supper   Hypothyroidism  - resumed home levothyroxine    Uncontrolled type 2 DM with hyperglycemia and vascular complications - A1c>9 - increase basal lantus  to 40 units and novolog  to 12 units TID with meal plus supplemental SSI coverage - frequent CBG monitoring ordered CBG (last 3)  Recent Labs    10/10/24 0315 10/10/24 0709 10/10/24 1108  GLUCAP 264* 379* 211*    GERD - pantoprazole  ordered for GI protection   Lactic acidosis--RESOLVED - secondary to dehydration  - sepsis ruled out - lactic acidosis resolved now   DNR/DNI - continue DNR orders in hospital    Leukocytosis--RESOLVED  - suspect reactive - she has been on antibiotic treatment for recent aspiration pneumonia - recheck CBC/diff in AM  - add on procalcitonin    Aspiration pneumonia - aspiration precautions - complete course of oral augmentin  (probiotics added)   Essential hypertension  - resumed home meds  DVT prophylaxis: enoxaparin   Code Status: DNR  Family Communication: son updated @ bedside Disposition: declining SNF, planning home with [facility] tomorrow, updated son    Consultants:   Procedures:   Antimicrobials:    Subjective: Unfortunately continues with urinary retention and foley cath was placed this morning.     Objective: Vitals:   10/09/24 1944 10/10/24 0316 10/10/24 0736 10/10/24 1254  BP:  119/65  110/60  Pulse:   64  (!) 105  Resp:  16  17  Temp:  97.6 F (36.4 C)  98 F (36.7 C)  TempSrc:  Oral  Oral  SpO2: 94% 94% 94% 96%  Weight:      Height:        Intake/Output Summary (Last 24 hours) at 10/10/2024 1629 Last data filed at 10/10/2024 1100 Gross per 24 hour  Intake 480 ml  Output 2400 ml  Net -1920 ml   Filed Weights   10/07/24 1045  Weight: 66.7 kg   Examination:  General exam: Appears calm and comfortable  Respiratory system: Clear to auscultation. Respiratory effort normal. Cardiovascular system: normal S1 & S2 heard. No JVD, murmurs, rubs, gallops or clicks. No pedal edema. Gastrointestinal system: Abdomen is nondistended, soft and nontender. No organomegaly or masses felt. Normal bowel sounds heard. Central nervous system: Alert and oriented. No focal neurological deficits. Extremities: Symmetric 5 x 5 power. Skin: No rashes, lesions or ulcers. Psychiatry: Judgement and insight appear normal. Mood & affect appropriate.   Data Reviewed: I have personally reviewed following labs and imaging studies  CBC: Recent Labs  Lab 10/07/24 1114 10/08/24 0317  WBC 21.6* 9.2  NEUTROABS 19.7* 7.0  HGB 10.2* 9.3*  HCT 31.3* 29.0*  MCV 85.5 84.5  PLT 234 221    Basic Metabolic Panel: Recent Labs  Lab 10/07/24 1114 10/08/24 0317 10/09/24 0352  NA 132* 137 136  K 4.0 3.9 4.0  CL 97* 106 102  CO2 21* 21* 22  GLUCOSE 321* 197* 253*  BUN 25* 18 14  CREATININE 1.28* 0.78 0.74  CALCIUM  9.6 9.6 9.7  MG  --  1.9 2.0    CBG: Recent Labs  Lab 10/09/24 1555 10/09/24 1942 10/10/24 0315 10/10/24 0709 10/10/24 1108  GLUCAP 216* 125* 264* 379* 211*    Recent Results (from the past 240 hours)  Resp panel by RT-PCR (RSV, Flu A&B, Covid) Anterior Nasal Swab     Status: None   Collection Time: 10/07/24 12:01 PM   Specimen: Anterior Nasal Swab  Result Value Ref Range Status   SARS Coronavirus 2 by RT PCR NEGATIVE NEGATIVE Final    Comment: (NOTE) SARS-CoV-2 target nucleic  acids are NOT DETECTED.  The SARS-CoV-2 RNA is generally detectable in upper respiratory specimens during the acute phase of infection. The lowest concentration of SARS-CoV-2 viral copies this assay can detect is 138 copies/mL. A negative result does not preclude SARS-Cov-2 infection and should not be used as the sole basis for treatment or other patient management decisions. A negative result may occur with  improper specimen collection/handling, submission of specimen other than nasopharyngeal swab, presence of viral mutation(s) within the areas targeted by this assay, and inadequate number of viral copies(<138 copies/mL). A negative result must be combined with clinical observations, patient history, and epidemiological information. The expected result is Negative.  Fact Sheet for Patients:  bloggercourse.com  Fact Sheet for Healthcare Providers:  seriousbroker.it  This test is no t yet approved or cleared by the [place]  FDA and  has  been authorized for detection and/or diagnosis of SARS-CoV-2 by FDA under an Emergency Use Authorization (EUA). This EUA will remain  in effect (meaning this test can be used) for the duration of the COVID-19 declaration under Section 564(b)(1) of the Act, 21 U.S.C.section 360bbb-3(b)(1), unless the authorization is terminated  or revoked sooner.       Influenza A by PCR NEGATIVE NEGATIVE Final   Influenza B by PCR NEGATIVE NEGATIVE Final    Comment: (NOTE) The Xpert Xpress SARS-CoV-2/FLU/RSV plus assay is intended as an aid in the diagnosis of influenza from Nasopharyngeal swab specimens and should not be used as a sole basis for treatment. Nasal washings and aspirates are unacceptable for Xpert Xpress SARS-CoV-2/FLU/RSV testing.  Fact Sheet for Patients: bloggercourse.com  Fact Sheet for Healthcare Providers: seriousbroker.it  This  test is not yet approved or cleared by the [place]  FDA and has been authorized for detection and/or diagnosis of SARS-CoV-2 by FDA under an Emergency Use Authorization (EUA). This EUA will remain in effect (meaning this test can be used) for the duration of the COVID-19 declaration under Section 564(b)(1) of the Act, 21 U.S.C. section 360bbb-3(b)(1), unless the authorization is terminated or revoked.     Resp Syncytial Virus by PCR NEGATIVE NEGATIVE Final    Comment: (NOTE) Fact Sheet for Patients: bloggercourse.com  Fact Sheet for Healthcare Providers: seriousbroker.it  This test is not yet approved or cleared by the [place]  FDA and has been authorized for detection and/or diagnosis of SARS-CoV-2 by FDA under an Emergency Use Authorization (EUA). This EUA will remain in effect (meaning this test can be used) for the duration of the COVID-19 declaration under Section 564(b)(1) of the Act, 21 U.S.C. section 360bbb-3(b)(1), unless the authorization is terminated or revoked.  Performed at [facility], [address], [place], [place] [address]   Culture, blood (single)     Status: None (Preliminary result)   Collection Time: 10/07/24  2:03 PM   Specimen: BLOOD  Result Value Ref Range Status   Specimen Description BLOOD RIGHT ASSIST CONTROL  Final   Special Requests   Final    BOTTLES DRAWN AEROBIC ONLY Blood Culture adequate volume   Culture   Final    NO GROWTH 3 DAYS Performed at [facility], [address], [place], [place] [address]    Report Status PENDING  Incomplete     Radiology Studies: No results found.   Scheduled Meds:  ALPRAZolam   0.5 mg Oral QHS   amoxicillin -clavulanate  1 tablet Oral BID   aspirin  EC  81 mg Oral Daily   atorvastatin   80 mg Oral QPM   budesonide-glycopyrrolate-formoterol  2 puff Inhalation BID   Chlorhexidine  Gluconate Cloth  6 each Topical Daily   insulin  aspart  0-15 Units  Subcutaneous TID WC   insulin  aspart  0-5 Units Subcutaneous QHS   insulin  aspart  14 Units Subcutaneous TID WC   insulin  glargine-yfgn  45 Units Subcutaneous QHS   levothyroxine   125 mcg Oral Daily   metoprolol  succinate  25 mg Oral Daily   pantoprazole   40 mg Oral Daily   saccharomyces boulardii  250 mg Oral BID   ticagrelor   60 mg Oral BID   Continuous Infusions:     LOS: 1 day   Time spent: 55 mins  Sailor Haughn Vicci, MD How to contact the [facility] Attending or Consulting provider 7A - 7P or covering provider during after hours 7P -7A, for this patient?  Check the care team in [facility]  and look for a) attending/consulting TRH provider listed and b) the TRH team listed Log into [email or web address] to find provider on call.  Locate the TRH provider you are looking for under [facility] and page to a number that you can be directly reached. If you still have difficulty reaching the provider, please page the [facility] (Director on Call) for the Hospitalists listed on amion for assistance.  10/10/2024, 4:29 PM

## 2024-10-10 NOTE — Plan of Care (Signed)
   Problem: Education: Goal: Knowledge of General Education information will improve Description: Including pain rating scale, medication(s)/side effects and non-pharmacologic comfort measures Outcome: Progressing   Problem: Activity: Goal: Risk for activity intolerance will decrease Outcome: Progressing   Problem: Nutrition: Goal: Adequate nutrition will be maintained Outcome: Progressing

## 2024-10-10 NOTE — Plan of Care (Signed)
  Problem: Education: Goal: Knowledge of General Education information will improve Description: Including pain rating scale, medication(s)/side effects and non-pharmacologic comfort measures Outcome: Progressing   Problem: Clinical Measurements: Goal: Ability to maintain clinical measurements within normal limits will improve Outcome: Progressing   Problem: Nutrition: Goal: Adequate nutrition will be maintained Outcome: Progressing   Problem: Activity: Goal: Risk for activity intolerance will decrease Outcome: Progressing   Problem: Coping: Goal: Level of anxiety will decrease Outcome: Progressing

## 2024-10-10 NOTE — Progress Notes (Signed)
 Upon assessment at 2000, no urinary catheter in place. Pt bladder scan showed >449mL. Pt assisted to [facility] with no output, assisted back in bed and stated she wanted to wait a bit longer to try and drink fluids and pee using external urinary catheter. Pt assisted to the [facility] again at 2200 with NT, stating she had a small amount of output but still felt like her bladder was retaining urine. Charge Nurse Garrel made aware. Bladder Scan at 0000 showed >657mL. Pt educated about urinary catheter and agrees.. At 0200, Suli and myself inserted 14 in Urethral Catheter using sterile technique with no issues, complaints, and pt tolerated well. Noted of urine, clear and yellow.

## 2024-10-11 DIAGNOSIS — N179 Acute kidney failure, unspecified: Secondary | ICD-10-CM | POA: Diagnosis not present

## 2024-10-11 DIAGNOSIS — E860 Dehydration: Secondary | ICD-10-CM | POA: Diagnosis not present

## 2024-10-11 LAB — GLUCOSE, CAPILLARY
Glucose-Capillary: 253 mg/dL — ABNORMAL HIGH (ref 70–99)
Glucose-Capillary: 183 mg/dL — ABNORMAL HIGH (ref 70–99)
Glucose-Capillary: 172 mg/dL — ABNORMAL HIGH (ref 70–99)

## 2024-10-11 MED ORDER — MEGESTROL ACETATE 400 MG/10ML PO SUSP: 400.0000 mg | Freq: Every day | ORAL | 1 refills | Status: DC

## 2024-10-11 NOTE — Plan of Care (Signed)
   Problem: Activity: Goal: Risk for activity intolerance will decrease Outcome: Progressing   Problem: Nutrition: Goal: Adequate nutrition will be maintained Outcome: Progressing   Problem: Coping: Goal: Level of anxiety will decrease Outcome: Progressing

## 2024-10-11 NOTE — Discharge Summary (Signed)
 Physician Discharge Summary   Patient: Gabrielle Wood MRN: [id number] DOB: 08/06/1946  Admit date:     10/07/2024  Discharge date: 10/11/24  Discharge Physician: Adriana DELENA Grams   PCP: [name], MD   Recommendations at discharge:  Follow-up with PCP in 1 week, continue aggressive PT OT, fall precautions Follow-up with the palliative care team as an outpatient  Discharge Diagnoses: Principal Problem:   AKI (acute kidney injury) Active Problems:   Leukocytosis   Lactic acidosis   Failure to thrive in adult   Mixed hyperlipidemia   Hypothyroidism   GERD (gastroesophageal reflux disease)   Taking multiple medications for chronic disease   Rheumatoid arthritis (HCC)   UPJ obstruction, congenital   Uncontrolled type 2 diabetes mellitus with hyperglycemia (HCC)   Normocytic anemia   Hospital Course: 78 year old female with recent failure to thrive, hypothyroidism, uncontrolled type 2 diabetes mellitus, hypertension, generalized weakness and debility, recently discovered chronic right UPJ obstruction, CAD status post NSTEMI in 2022 status post PCI DES to the LCx, history of DVT, GERD, hyperlipidemia, COPD, OA, recent hospitalization at [facility] for aspiration pneumonia, severe sepsis, acute metabolic encephalopathy, AKI who apparently initially had improved and was subsequently discharged home with home health services.  She is returning to the ED today by POV with a family member reporting that patient has been weak since being discharged from home and having difficulty ambulating with poor oral intake.  He has been having generalized weakness and a persistent cough.  She was found to be dehydrated with a blood glucose of 293 and an AKI with a creatinine of 1.28, WBC 21.6, lactic acid 2.4.  All of her viral testing has been negative.  She was hypotensive on arrival with a blood pressure of 84/54, MAP 64, pulse ox 92% on room air.  Fortunately her blood pressures improved after IV fluid  bolus was given in the ED.  She was empirically started on IV antibiotics.  Admission was requested for further management.   Dehydration with AKI -- RESOLVED  - likely precipitated by hyperglycemia - BS >300 on presentation  - IV fluids ordered - working on glycemic control - has been difficult   Adult Failure to thrive - multiple hospitalizations in past year at least monthly at different facilities - palliative care consulted at last visit - she is DNR / DNI  - she had wanted to treat the treatable  - pt adamant she will NOT go to SNF - PT/OT eval requested -- home health recommended  - TOC eval requested -- plan is to return home tomorrow with son   Chronic right UPJ obstruction - she recently saw Dr. Sherrilee in hospital - see consult note - he had recommended observation and outpatient follow up  - bladder scans every 4 hours ordered  - cath PRN if unable to void    Acute Urinary Retention - pt still retaining significant urine after 24 hours of I/O cath - foley catheter placed on 10/10/24  - monitor urinary flow  - follow up with Dr. Sherrilee urology with [facility] [place]  - DC myrbetriq  - tamsulosin 0.4 mg with supper   Hypothyroidism  - resumed home levothyroxine    Uncontrolled type 2 DM with hyperglycemia and vascular complications - A1c>9 - increase basal lantus  to 40 units and novolog  to 12 units TID with meal plus supplemental SSI coverage - frequent CBG monitorin CBG (last 3)  Recent Labs    10/10/24 2042 10/11/24 0248 10/11/24  0748  GLUCAP 318* 253* 183*      GERD - pantoprazole  ordered for GI protection   Lactic acidosis--RESOLVED - secondary to dehydration  - sepsis ruled out - lactic acidosis resolved now   DNR/DNI - continue DNR orders in hospital    Leukocytosis--RESOLVED  - suspect reactive - she has been on antibiotic treatment for recent aspiration pneumonia - recheck CBC/diff in AM  - add on procalcitonin     Aspiration pneumonia - aspiration precautions - complete course of oral augmentin  (probiotics added)   Essential hypertension  - resumed home meds    Disposition: Home Diet recommendation:  Discharge Diet Orders (From admission, onward)     Start     Ordered   10/11/24 0000  Diet - low sodium heart healthy        10/11/24 1003           Carb modified diet DISCHARGE MEDICATION: Allergies as of 10/11/2024       Reactions   Amlodipine  Other (See Comments)   Unknown    Ciprocin-fluocin-procin [fluocinolone] Other (See Comments)   Nightmares   Lipitor [atorvastatin ] Other (See Comments)   Muscle aches, pain   Nebivolol Other (See Comments)   Unknown    Nitrofuran Derivatives Other (See Comments)   Unknown    Lisinopril Cough   Red Yeast Rice [cholestin] Itching, Rash        Medication List     PAUSE taking these medications    valsartan 40 MG tablet Wait to take this until: October 15, 2024 Commonly known as: DIOVAN Take 40 mg by mouth 2 (two) times daily.       STOP taking these medications    amoxicillin -clavulanate 875-125 MG tablet Commonly known as: AUGMENTIN    nitrofurantoin (macrocrystal-monohydrate) 100 MG capsule Commonly known as: MACROBID       TAKE these medications    acetaminophen  500 MG tablet Commonly known as: TYLENOL  Take 1,000 mg by mouth in the morning and at bedtime.   albuterol  108 (90 Base) MCG/ACT inhaler Commonly known as: VENTOLIN  HFA Inhale 1-2 puffs into the lungs every 4 (four) hours as needed for wheezing or shortness of breath.   ALPRAZolam  1 MG tablet Commonly known as: XANAX  Take 0.5 tablets (0.5 mg total) by mouth at bedtime. 0.5 tablet in am and 0.5 tab at night What changed: additional instructions   aspirin  EC 81 MG tablet Take 1 tablet (81 mg total) by mouth daily. Swallow whole.   atorvastatin  80 MG tablet Commonly known as: LIPITOR Take 80 mg by mouth every evening.   baclofen 10 MG  tablet Commonly known as: LIORESAL Take 10 mg by mouth 2 (two) times daily as needed for muscle spasms.   Brilinta  60 MG Tabs tablet Generic drug: ticagrelor  Take 60 mg by mouth 2 (two) times daily.   FreeStyle Libre 3 Plus Sensor Misc Change sensor every 15 days.   gabapentin  100 MG capsule Commonly known as: NEURONTIN  Take 1 capsule (100 mg total) by mouth 3 (three) times daily.   insulin  lispro 100 UNIT/ML KwikPen Commonly known as: HumaLOG  KwikPen Inject 8-14 Units into the skin 3 (three) times daily. What changed: how much to take   levothyroxine  125 MCG tablet Commonly known as: SYNTHROID  Take 1 tablet (125 mcg total) by mouth daily.   magnesium  oxide 400 (240 Mg) MG tablet Commonly known as: MAG-OX Take 400 mg by mouth daily.   megestrol 400 MG/10ML suspension Commonly known as: MEGACE Take 10 mLs (  400 mg total) by mouth daily.   metoprolol  succinate 25 MG 24 hr tablet Commonly known as: TOPROL -XL Take 25 mg by mouth daily.   Myrbetriq 25 MG Tb24 tablet Generic drug: mirabegron ER Take 25 mg by mouth daily.   nitroGLYCERIN 0.4 MG SL tablet Commonly known as: NITROSTAT Place 0.4 mg under the tongue every 5 (five) minutes as needed for chest pain.   OXYGEN  Inhale 2 L/min into the lungs as needed (shortness of breath, low O2 sats).   pantoprazole  40 MG tablet Commonly known as: PROTONIX  Take 40 mg by mouth daily.   senna-docusate 8.6-50 MG tablet Commonly known as: Senokot-S Take 1 tablet by mouth at bedtime as needed for mild constipation.   Trelegy Ellipta 100-62.5-25 MCG/ACT Aepb Generic drug: Fluticasone-Umeclidin-Vilant Inhale 1 puff into the lungs daily.   Tresiba  FlexTouch 100 UNIT/ML FlexTouch Pen Generic drug: insulin  degludec Inject 26 Units into the skin at bedtime. What changed: how much to take        Discharge Exam: Filed Weights   10/07/24 1045  Weight: 66.7 kg        General:  AAO x 3,  cooperative, no distress;    HEENT:  Normocephalic, PERRL, otherwise with in Normal limits   Neuro:  CNII-XII intact. , normal motor and sensation, reflexes intact   Lungs:   Clear to auscultation BL, Respirations unlabored,  No wheezes / crackles  Cardio:    S1/S2, RRR, No murmure, No Rubs or Gallops   Abdomen:  Soft, non-tender, bowel sounds active all four quadrants, no guarding or peritoneal signs.  Muscular  skeletal:  Limited exam -global generalized weaknesses - in bed, able to move all 4 extremities,   2+ pulses,  symmetric, No pitting edema  Skin:  Dry, warm to touch, negative for any Rashes,  Wounds: Please see nursing documentation          Condition at discharge: good  The results of significant diagnostics from this hospitalization (including imaging, microbiology, ancillary and laboratory) are listed below for reference.   Imaging Studies: DG Chest 2 View Result Date: 10/07/2024 EXAM: 2 VIEW(S) XRAY OF THE CHEST 10/07/2024 11:12:00 AM COMPARISON: Prior studies are available. CLINICAL HISTORY: Weakness. FINDINGS: LUNGS AND PLEURA: Minimal left basal scarring or subsegmental atelectasis is noted. Small pleural effusion. No pulmonary edema. No pneumothorax. HEART AND MEDIASTINUM: No acute abnormality of the cardiac and mediastinal silhouettes. BONES AND SOFT TISSUES: No acute osseous abnormality. IMPRESSION: 1. Minimal left basal scarring or subsegmental atelectasis. 2. Small left pleural effusion. Electronically signed by: Lynwood Seip MD 10/07/2024 11:50 AM EST RP Workstation: [id number]   CT CHEST ABDOMEN PELVIS W CONTRAST Result Date: 09/29/2024 CLINICAL DATA:  Left-sided chest pain right lower quadrant abdominal pain EXAM: CT CHEST, ABDOMEN, AND PELVIS WITH CONTRAST TECHNIQUE: Multidetector CT imaging of the chest, abdomen and pelvis was performed following the standard protocol during bolus administration of intravenous contrast. RADIATION DOSE REDUCTION: This exam was performed according to the  departmental dose-optimization program which includes automated exposure control, adjustment of the mA and/or kV according to patient size and/or use of iterative reconstruction technique. CONTRAST:  80mL OMNIPAQUE  IOHEXOL  300 MG/ML  SOLN COMPARISON:  CT 08/15/2024, 01/06/2024, CT 11/04/2022, 07/05/2022, 10/14/2021 FINDINGS: CT CHEST FINDINGS Cardiovascular: Moderate aortic atherosclerosis. No aneurysm. Multi vessel coronary vascular calcification. Normal cardiac size. No pericardial effusion Mediastinum/Nodes: Patent trachea. No thyroid  mass. Borderline mediastinal lymph nodes as before, AP window nodes up to 11 mm. Small hiatal hernia Lungs/Pleura: Mild diffuse septal thickening.  Apical scarring. Bronchial wall thickening and persistent subpleural left greater than right consolidation as compared with CT from last month. Atelectasis or small focus of consolidation at the lingula, slightly progressive. Trace pleural effusions. Musculoskeletal: Sternum appears intact. No acute or suspicious osseous abnormality. CT ABDOMEN PELVIS FINDINGS Hepatobiliary: No focal liver abnormality is seen. No gallstones, gallbladder wall thickening, or biliary dilatation. Pancreas: Unremarkable. No pancreatic ductal dilatation or surrounding inflammatory changes. Spleen: Normal in size without focal abnormality. Adrenals/Urinary Tract: Adrenal glands are normal. Renal cysts for which no imaging follow-up is recommended. Moderate right-sided hydronephrosis to the level of the UPJ stable to slightly progressive compared with most recent prior CT. Possible mild urothelial thickening of proximal right ureter, series 2, image 91. Distended urinary bladder. Right ureter decompressed. Stomach/Bowel: Stomach is nondistended. No dilated small bowel. Negative appendix. Question mild wall thickening of ascending colon with slight mucosal enhancement, series 2, image 91 through 99. Not much surrounding stranding. Vascular/Lymphatic: Aortic  atherosclerosis. No enlarged abdominal or pelvic lymph nodes. Reproductive: Uterus and bilateral adnexa are unremarkable. Calcified uterine fibroid Other: Negative for pelvic effusion or free air Musculoskeletal: No acute or suspicious osseous abnormality. IMPRESSION: 1. Bronchial wall thickening and persistent subpleural left greater than right consolidation and trace pleural effusions as compared with CT from last month. Atelectasis or small focus of consolidation at the lingula, slightly progressive. Findings could be secondary to infection or potential aspiration. 2. Moderate right-sided hydronephrosis to the level of the UPJ, stable to slightly progressive compared with most recent prior CT. Possible mild urothelial thickening of proximal right ureter, question mild inflammatory or infectious process. UPJ obstruction/proximal ureteral stricture also considered. Recommend urology consultation if not already performed. 3. Possible mild wall thickening of ascending colon with slight mucosal enhancement but no significant surrounding edema or inflammation. Correlate for colitis type symptoms. 4. Aortic atherosclerosis. Aortic Atherosclerosis (ICD10-I70.0). Electronically Signed   By: Luke Bun M.D.   On: 09/29/2024 15:33   CT Head Wo Contrast Result Date: 09/29/2024 CLINICAL DATA:  Headache EXAM: CT HEAD WITHOUT CONTRAST TECHNIQUE: Contiguous axial images were obtained from the base of the skull through the vertex without intravenous contrast. RADIATION DOSE REDUCTION: This exam was performed according to the departmental dose-optimization program which includes automated exposure control, adjustment of the mA and/or kV according to patient size and/or use of iterative reconstruction technique. COMPARISON:  CT brain 12/08/2021, 12/17/2023 FINDINGS: Brain: No acute territorial infarction, hemorrhage or intracranial mass.atrophy and mild chronic small vessel ischemic changes of the white matter. 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 Stable ventricle size. Vascular: No hyperdense vessels.  Carotid vascular calcification Skull: Normal. Negative for fracture or focal lesion. Sinuses/Orbits: No acute finding. Other: None IMPRESSION: 1. No CT evidence for acute intracranial abnormality. 2. Atrophy and chronic small vessel ischemic changes of the white matter. 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 Electronically Signed   By: Luke Bun M.D.   On: 09/29/2024 15:16   DG Chest Port 1 View Result Date: 09/29/2024 CLINICAL DATA:  Weakness EXAM: PORTABLE CHEST 1 VIEW COMPARISON:  Chest radiograph dated 08/14/2024 FINDINGS: Normal lung volumes. Patchy left basilar opacity. Unchanged blunting of the left costophrenic angle. No pneumothorax. The heart size and mediastinal contours are within normal limits. No acute osseous abnormality. IMPRESSION: 1. Patchy left basilar opacity, likely atelectasis. 2. Unchanged blunting of the left costophrenic angle, which may represent a small pleural effusion. Electronically Signed   By: Limin  Xu M.D.   On: 09/29/2024 13:13  Microbiology: Results for orders placed or performed during the hospital encounter of 10/07/24  Resp panel by RT-PCR (RSV, Flu A&B, Covid) Anterior Nasal Swab     Status: None   Collection Time: 10/07/24 12:01 PM   Specimen: Anterior Nasal Swab  Result Value Ref Range Status   SARS Coronavirus 2 by RT PCR NEGATIVE NEGATIVE Final    Comment: (NOTE) SARS-CoV-2 target nucleic acids are NOT DETECTED.  The SARS-CoV-2 RNA is generally detectable in upper respiratory specimens during the acute phase of infection. The lowest concentration of SARS-CoV-2 viral copies this assay can detect is 138 copies/mL. A negative result does not preclude SARS-Cov-2 infection and should not be used as the sole basis for treatment or other patient management decisions. A negative result may occur with  improper specimen collection/handling, submission  of specimen other than nasopharyngeal swab, presence of viral mutation(s) within the areas targeted by this assay, and inadequate number of viral copies(<138 copies/mL). A negative result must be combined with clinical observations, patient history, and epidemiological information. The expected result is Negative.  Fact Sheet for Patients:  bloggercourse.com  Fact Sheet for Healthcare Providers:  seriousbroker.it  This test is no t yet approved or cleared by the [place]  FDA and  has been authorized for detection and/or diagnosis of SARS-CoV-2 by FDA under an Emergency Use Authorization (EUA). This EUA will remain  in effect (meaning this test can be used) for the duration of the COVID-19 declaration under Section 564(b)(1) of the Act, 21 U.S.C.section 360bbb-3(b)(1), unless the authorization is terminated  or revoked sooner.       Influenza A by PCR NEGATIVE NEGATIVE Final   Influenza B by PCR NEGATIVE NEGATIVE Final    Comment: (NOTE) The Xpert Xpress SARS-CoV-2/FLU/RSV plus assay is intended as an aid in the diagnosis of influenza from Nasopharyngeal swab specimens and should not be used as a sole basis for treatment. Nasal washings and aspirates are unacceptable for Xpert Xpress SARS-CoV-2/FLU/RSV testing.  Fact Sheet for Patients: bloggercourse.com  Fact Sheet for Healthcare Providers: seriousbroker.it  This test is not yet approved or cleared by the [place]  FDA and has been authorized for detection and/or diagnosis of SARS-CoV-2 by FDA under an Emergency Use Authorization (EUA). This EUA will remain in effect (meaning this test can be used) for the duration of the COVID-19 declaration under Section 564(b)(1) of the Act, 21 U.S.C. section 360bbb-3(b)(1), unless the authorization is terminated or revoked.     Resp Syncytial Virus by PCR NEGATIVE NEGATIVE  Final    Comment: (NOTE) Fact Sheet for Patients: bloggercourse.com  Fact Sheet for Healthcare Providers: seriousbroker.it  This test is not yet approved or cleared by the [place]  FDA and has been authorized for detection and/or diagnosis of SARS-CoV-2 by FDA under an Emergency Use Authorization (EUA). This EUA will remain in effect (meaning this test can be used) for the duration of the COVID-19 declaration under Section 564(b)(1) of the Act, 21 U.S.C. section 360bbb-3(b)(1), unless the authorization is terminated or revoked.  Performed at [facility], [address], [place], [place] [address]   Culture, blood (single)     Status: None (Preliminary result)   Collection Time: 10/07/24  2:03 PM   Specimen: BLOOD  Result Value Ref Range Status   Specimen Description BLOOD RIGHT ASSIST CONTROL  Final   Special Requests   Final    BOTTLES DRAWN AEROBIC ONLY Blood Culture adequate volume   Culture   Final    NO GROWTH  4 DAYS Performed at [facility], [address], [place], [place] [address]    Report Status PENDING  Incomplete    Labs: CBC: Recent Labs  Lab 10/07/24 1114 10/08/24 0317  WBC 21.6* 9.2  NEUTROABS 19.7* 7.0  HGB 10.2* 9.3*  HCT 31.3* 29.0*  MCV 85.5 84.5  PLT 234 221   Basic Metabolic Panel: Recent Labs  Lab 10/07/24 1114 10/08/24 0317 10/09/24 0352  NA 132* 137 136  K 4.0 3.9 4.0  CL 97* 106 102  CO2 21* 21* 22  GLUCOSE 321* 197* 253*  BUN 25* 18 14  CREATININE 1.28* 0.78 0.74  CALCIUM  9.6 9.6 9.7  MG  --  1.9 2.0   Liver Function Tests: Recent Labs  Lab 10/07/24 1114  AST 19  ALT 16  ALKPHOS 55  BILITOT 0.7  PROT 7.2  ALBUMIN 3.7   CBG: Recent Labs  Lab 10/10/24 1618 10/10/24 1639 10/10/24 2042 10/11/24 0248 10/11/24 0748  GLUCAP 69* 75 318* 253* 183*    Discharge time spent: greater than 30 minutes.  Signed: Adriana DELENA Grams, MD [facility] 10/11/2024

## 2024-10-11 NOTE — TOC Transition Note (Signed)
 Transition of Care [facility]) - Discharge Note   Patient Details  Name: Gabrielle Wood MRN: [id number] Date of Birth: [birth year]  [facility] CM/SW Contact:  Gabrielle Wood Phone Number: 10/11/2024, 10:35 AM   Clinical Narrative:     Patient is DC today. CSW spoke with son about HH being arranged for PT/OT/RN.Son confirmed being able to pick patient up today around 47. Gabrielle Wood with [facility] made aware of DC. CSW asked MD for orders. CSW signing off.    Final next level of care: Home w Home Health Services Barriers to Discharge: Continued Medical Work up   Patient Goals and CMS Choice Patient states their goals for this hospitalization and ongoing recovery are:: return home CMS Medicare.gov Compare Post Acute Care list provided to:: Patient Represenative (must comment) (son- Gabrielle Wood) Choice offered to / list presented to : Adult Children      Discharge Placement                Patient to be transferred to facility by: Son Name of family member notified: Gabrielle Wood- son Patient and family notified of of transfer: 10/11/24  Discharge Plan and Services Additional resources added to the After Visit Summary for   In-house Referral: Clinical Social Work Discharge Planning Services: CM Consult Post Acute Care Choice: Home Health, Resumption of Svcs/PTA Provider                    HH Arranged: PT, RN, OT [facility] Agency: [facility] Date [facility] Agency Contacted: 10/11/24 Time HH Agency Contacted: 1034 Representative spoke with at [place] Agency: Holli  Social Drivers of Health (SDOH) Interventions SDOH Screenings   Food Insecurity: No Food Insecurity (10/07/2024)  Housing: Low Risk  (10/07/2024)  Transportation Needs: No Transportation Needs (10/07/2024)  Utilities: Not At Risk (10/07/2024)  Depression (PHQ2-9): High Risk (11/01/2021)  Financial Resource Strain: Low Risk  (08/11/2024)   Received from [facility]  Physical Activity: Inactive (08/11/2024)   Received  from [facility]  Social Connections: Socially Isolated (10/07/2024)  Stress: No Stress Concern Present (08/11/2024)   Received from [facility]  Tobacco Use: Medium Risk (10/07/2024)  Health Literacy: Medium Risk (08/11/2024)   [facility] from [facility]     Readmission Risk Interventions    10/11/2024   10:33 AM 10/10/2024    9:49 AM 10/09/2024   10:17 AM  Readmission Risk Prevention Plan  Transportation Screening Complete Complete Complete  Home Care Screening Complete  Complete  Medication Review (RN CM) Complete  Complete  HRI or Home Care Consult  Complete   Social Work Consult for Recovery Care Planning/Counseling  Complete   Palliative Care Screening  Not Applicable   Medication Review Oceanographer)  Complete

## 2024-10-12 LAB — CULTURE, BLOOD (SINGLE)
Culture: NO GROWTH
Special Requests: ADEQUATE

## 2024-10-12 NOTE — Progress Notes (Unsigned)
 Chief Complaint: Recurring UTIs, blocked right kidney  History of Present Illness:  Gabrielle Wood is a 78 y.o. female who is seen in consultation from Rosamond Mon B, MD for evaluation of current urinary tract infections and radiographic evidence of a UPJ obstruction.  Admitted to the hospital on the 28th of last month with sepsis.  She has multiple medical issues.  CT showed right hydronephrosis to the UPJ.  Urine cultures revealed multiple organisms.  Blood cultures were negative.  She comes in today with her son Gabrielle Wood.  He states that she has had multiple urinary tract infections.  She now has a catheter, as she was having high residual urine volumes.  She is quite weak, has difficulty walking.  Physical therapy is eventually planned.     Past Medical History:  Past Medical History:  Diagnosis Date   Arthritis    COPD (chronic obstructive pulmonary disease) (HCC)    Diabetes mellitus    DVT (deep venous thrombosis) (HCC)    years ago   GERD (gastroesophageal reflux disease)    H. pylori infection 2013   S/p treatment with amoxicillin , Biaxin, PPI   High cholesterol    Hypertension    Hypothyroidism    NSTEMI (non-ST elevated myocardial infarction) (HCC) 12/2020   s/p  PCI DES to the LCx   Thyroid  disease     Past Surgical History:  Past Surgical History:  Procedure Laterality Date   BACK SURGERY     lumbar x3    ESOPHAGOGASTRODUODENOSCOPY  04/04/2012   Moderate gastritis/MODERATE Duodenitis in the duodenal bulb duodenum.  Pathology with chronic H. pylori gastritis.   EUS  05/14/2012   Normal pancreas, gallbladder,biliary tree/ Incidental finding of simple-appearing right renal cyst.   TONSILLECTOMY     TUBAL LIGATION     VEIN SURGERY     stripping years ago but recent laser surgery; bilateral legs    Allergies:  Allergies  Allergen Reactions   Amlodipine  Other (See Comments)    Unknown    Ciprocin-Fluocin-Procin [Fluocinolone] Other (See Comments)     Nightmares   Lipitor [Atorvastatin ] Other (See Comments)    Muscle aches, pain   Nebivolol Other (See Comments)    Unknown    Nitrofuran Derivatives Other (See Comments)    Unknown    Lisinopril Cough   Red Yeast Rice [Cholestin] Itching and Rash    Family History:  Family History  Problem Relation Age of Onset   Pancreatic cancer Mother        diagnosed at age 74s, died from heart attack   Inflammatory bowel disease Brother        crohn's, his dgt deceased from colon cancer at age 57   Colon cancer Neg Hx     Social History:  Social History   Tobacco Use   Smoking status: Former    Current packs/day: 0.50    Types: Cigarettes   Smokeless tobacco: Never  Vaping Use   Vaping status: Never Used  Substance Use Topics   Alcohol  use: No   Drug use: No    Review of symptoms:  Constitutional: He has been weak.  Difficult to walk.  She uses a walker. ENT:  Negative for nose bleeds, sinus pain, painful swallowing CV:  Negative for chest pain, shortness of breath, exercise intolerance, palpitations, loss of consciousness Resp:  Negative for cough, wheezing, shortness of breath GI:  Negative for nausea, vomiting, diarrhea, bloody stools GU:  Positives noted in HPI; otherwise negative for  gross hematuria, dysuria, urinary incontinence Neuro:  Negative for seizures, poor balance, limb weakness, slurred speech Psych:  Negative for lack of energy, depression, anxiety Endocrine:  Negative for polydipsia, polyuria, symptoms of hypoglycemia (dizziness, hunger, sweating) Hematologic:  Negative for anemia, purpura, petechia, prolonged or excessive bleeding, use of anticoagulants  Allergic:  Negative for difficulty breathing or choking as a result of exposure to anything; no shellfish allergy; no allergic response (rash/itch) to materials, foods  Physical exam: There were no vitals taken for this visit. GENERAL APPEARANCE:  Well appearing, well developed, well nourished, NAD HEENT:  Atraumatic, Normocephalic, oropharynx clear. NECK: Supple without lymphadenopathy or thyromegaly. LUNGS: Clear to auscultation bilaterally. HEART: Regular Rate EXTREMITIES: Moves all extremities well.  Without clubbing, cyanosis, or edema. NEUROLOGIC:  Alert and oriented x 3, normal gait, CN II-XII grossly intact.  MENTAL STATUS:  Appropriate. BACK:  Non-tender to palpation.  No CVAT SKIN:  Warm, dry and intact.    Results:  I have reviewed referring/prior physicians records--both recent hospitalizations reviewed  I have reviewed urinalysis results  I have reviewed prior urine cultures--from 10/28--multiple species  I reviewed prior imaging studies  Assessment: 1.  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  This may actually be longstanding based on what looks like extrarenal pelvis on prior films  2.  Recurrent urinary tract infections  3.  Incomplete bladder emptying.  This actually may be exacerbating her right hydronephrosis.  Currently she has a Foley catheter in place   Plan: 1.  Continue Foley catheter for now, strongly recommend physical therapy  2.  She is apparently on daily Macrobid-I am fine with her continuing that  3.  Apparently she has been on Myrbetriq in the past.  I think she should stay off of that  4.  I will book her to see me in about 2 weeks following renal ultrasound

## 2024-10-13 ENCOUNTER — Emergency Department (HOSPITAL_COMMUNITY)

## 2024-10-13 ENCOUNTER — Other Ambulatory Visit: Payer: Self-pay

## 2024-10-13 ENCOUNTER — Inpatient Hospital Stay (HOSPITAL_COMMUNITY)
Admission: EM | Admit: 2024-10-13 | Discharge: 2024-10-15 | DRG: 640 | Disposition: A | Discharge status: Home Health | Attending: Internal Medicine | Admitting: Internal Medicine

## 2024-10-13 ENCOUNTER — Encounter (HOSPITAL_COMMUNITY): Payer: Self-pay

## 2024-10-13 ENCOUNTER — Ambulatory Visit: Admitting: Urology

## 2024-10-13 VITALS — BP 146/89 | HR 105

## 2024-10-13 DIAGNOSIS — N281 Cyst of kidney, acquired: Secondary | ICD-10-CM | POA: Diagnosis not present

## 2024-10-13 DIAGNOSIS — G9340 Encephalopathy, unspecified: Principal | ICD-10-CM

## 2024-10-13 DIAGNOSIS — R000 Tachycardia, unspecified: Secondary | ICD-10-CM | POA: Diagnosis not present

## 2024-10-13 DIAGNOSIS — M069 Rheumatoid arthritis, unspecified: Secondary | ICD-10-CM | POA: Diagnosis present

## 2024-10-13 DIAGNOSIS — R9082 White matter disease, unspecified: Secondary | ICD-10-CM | POA: Diagnosis not present

## 2024-10-13 DIAGNOSIS — Z87440 Personal history of urinary (tract) infections: Secondary | ICD-10-CM

## 2024-10-13 DIAGNOSIS — I2510 Atherosclerotic heart disease of native coronary artery without angina pectoris: Secondary | ICD-10-CM | POA: Diagnosis present

## 2024-10-13 DIAGNOSIS — R918 Other nonspecific abnormal finding of lung field: Secondary | ICD-10-CM | POA: Diagnosis not present

## 2024-10-13 DIAGNOSIS — A419 Sepsis, unspecified organism: Secondary | ICD-10-CM | POA: Diagnosis not present

## 2024-10-13 DIAGNOSIS — G9341 Metabolic encephalopathy: Secondary | ICD-10-CM | POA: Diagnosis present

## 2024-10-13 DIAGNOSIS — E7800 Pure hypercholesterolemia, unspecified: Secondary | ICD-10-CM | POA: Diagnosis present

## 2024-10-13 DIAGNOSIS — I700 Atherosclerosis of aorta: Secondary | ICD-10-CM | POA: Diagnosis not present

## 2024-10-13 DIAGNOSIS — E119 Type 2 diabetes mellitus without complications: Secondary | ICD-10-CM | POA: Diagnosis present

## 2024-10-13 DIAGNOSIS — Z9981 Dependence on supplemental oxygen: Secondary | ICD-10-CM

## 2024-10-13 DIAGNOSIS — R339 Retention of urine, unspecified: Secondary | ICD-10-CM | POA: Diagnosis not present

## 2024-10-13 DIAGNOSIS — Z86718 Personal history of other venous thrombosis and embolism: Secondary | ICD-10-CM

## 2024-10-13 DIAGNOSIS — R4182 Altered mental status, unspecified: Secondary | ICD-10-CM | POA: Diagnosis not present

## 2024-10-13 DIAGNOSIS — I10 Essential (primary) hypertension: Secondary | ICD-10-CM | POA: Diagnosis present

## 2024-10-13 DIAGNOSIS — Z8249 Family history of ischemic heart disease and other diseases of the circulatory system: Secondary | ICD-10-CM

## 2024-10-13 DIAGNOSIS — M6281 Muscle weakness (generalized): Secondary | ICD-10-CM | POA: Diagnosis present

## 2024-10-13 DIAGNOSIS — Z955 Presence of coronary angioplasty implant and graft: Secondary | ICD-10-CM

## 2024-10-13 DIAGNOSIS — J929 Pleural plaque without asbestos: Secondary | ICD-10-CM | POA: Diagnosis not present

## 2024-10-13 DIAGNOSIS — R5381 Other malaise: Secondary | ICD-10-CM | POA: Diagnosis present

## 2024-10-13 DIAGNOSIS — N130 Hydronephrosis with ureteropelvic junction obstruction: Secondary | ICD-10-CM

## 2024-10-13 DIAGNOSIS — Z888 Allergy status to other drugs, medicaments and biological substances status: Secondary | ICD-10-CM

## 2024-10-13 DIAGNOSIS — E860 Dehydration: Principal | ICD-10-CM | POA: Diagnosis present

## 2024-10-13 DIAGNOSIS — R6510 Systemic inflammatory response syndrome (SIRS) of non-infectious origin without acute organ dysfunction: Secondary | ICD-10-CM | POA: Diagnosis not present

## 2024-10-13 DIAGNOSIS — N136 Pyonephrosis: Secondary | ICD-10-CM | POA: Diagnosis present

## 2024-10-13 DIAGNOSIS — Z7982 Long term (current) use of aspirin: Secondary | ICD-10-CM

## 2024-10-13 DIAGNOSIS — N139 Obstructive and reflux uropathy, unspecified: Secondary | ICD-10-CM | POA: Diagnosis not present

## 2024-10-13 DIAGNOSIS — Z800 Family history of malignant neoplasm of digestive organs: Secondary | ICD-10-CM

## 2024-10-13 DIAGNOSIS — F419 Anxiety disorder, unspecified: Secondary | ICD-10-CM | POA: Diagnosis present

## 2024-10-13 DIAGNOSIS — F05 Delirium due to known physiological condition: Secondary | ICD-10-CM | POA: Diagnosis present

## 2024-10-13 DIAGNOSIS — Z794 Long term (current) use of insulin: Secondary | ICD-10-CM

## 2024-10-13 DIAGNOSIS — I252 Old myocardial infarction: Secondary | ICD-10-CM

## 2024-10-13 DIAGNOSIS — N1330 Unspecified hydronephrosis: Secondary | ICD-10-CM

## 2024-10-13 DIAGNOSIS — Z87891 Personal history of nicotine dependence: Secondary | ICD-10-CM

## 2024-10-13 DIAGNOSIS — E039 Hypothyroidism, unspecified: Secondary | ICD-10-CM | POA: Diagnosis present

## 2024-10-13 DIAGNOSIS — Z79899 Other long term (current) drug therapy: Secondary | ICD-10-CM

## 2024-10-13 DIAGNOSIS — J9611 Chronic respiratory failure with hypoxia: Secondary | ICD-10-CM | POA: Diagnosis present

## 2024-10-13 DIAGNOSIS — J449 Chronic obstructive pulmonary disease, unspecified: Secondary | ICD-10-CM | POA: Diagnosis present

## 2024-10-13 DIAGNOSIS — Z79890 Hormone replacement therapy (postmenopausal): Secondary | ICD-10-CM

## 2024-10-13 DIAGNOSIS — Z7902 Long term (current) use of antithrombotics/antiplatelets: Secondary | ICD-10-CM

## 2024-10-13 DIAGNOSIS — N390 Urinary tract infection, site not specified: Secondary | ICD-10-CM

## 2024-10-13 DIAGNOSIS — R627 Adult failure to thrive: Secondary | ICD-10-CM | POA: Diagnosis present

## 2024-10-13 LAB — CBC WITH DIFFERENTIAL/PLATELET
Abs Immature Granulocytes: 0.31 K/uL — ABNORMAL HIGH (ref 0.00–0.07)
Basophils Absolute: 0.1 K/uL (ref 0.0–0.1)
Basophils Relative: 0 %
Eosinophils Absolute: 0.1 K/uL (ref 0.0–0.5)
Eosinophils Relative: 0 %
HCT: 33.1 % — ABNORMAL LOW (ref 36.0–46.0)
Hemoglobin: 10.9 g/dL — ABNORMAL LOW (ref 12.0–15.0)
Immature Granulocytes: 2 %
Lymphocytes Relative: 6 %
Lymphs Abs: 1.1 K/uL (ref 0.7–4.0)
MCH: 27.3 pg (ref 26.0–34.0)
MCHC: 32.9 g/dL (ref 30.0–36.0)
MCV: 82.8 fL (ref 80.0–100.0)
Monocytes Absolute: 0.9 K/uL (ref 0.1–1.0)
Monocytes Relative: 5 %
Neutro Abs: 16.1 K/uL — ABNORMAL HIGH (ref 1.7–7.7)
Neutrophils Relative %: 87 %
Platelets: 305 K/uL (ref 150–400)
RBC: 4 MIL/uL (ref 3.87–5.11)
RDW: 15.2 % (ref 11.5–15.5)
WBC: 18.5 K/uL — ABNORMAL HIGH (ref 4.0–10.5)
nRBC: 0 % (ref 0.0–0.2)

## 2024-10-13 LAB — COMPREHENSIVE METABOLIC PANEL WITH GFR
ALT: 14 U/L (ref 0–44)
AST: 20 U/L (ref 15–41)
Albumin: 4 g/dL (ref 3.5–5.0)
Alkaline Phosphatase: 60 U/L (ref 38–126)
Anion gap: 14 (ref 5–15)
BUN: 18 mg/dL (ref 8–23)
CO2: 21 mmol/L — ABNORMAL LOW (ref 22–32)
Calcium: 9.9 mg/dL (ref 8.9–10.3)
Chloride: 96 mmol/L — ABNORMAL LOW (ref 98–111)
Creatinine, Ser: 0.83 mg/dL (ref 0.44–1.00)
GFR, Estimated: >60 mL/min (ref >60)
Glucose, Bld: 280 mg/dL — ABNORMAL HIGH (ref 70–99)
Potassium: 3.8 mmol/L (ref 3.5–5.1)
Sodium: 131 mmol/L — ABNORMAL LOW (ref 135–145)
Total Bilirubin: 0.6 mg/dL (ref 0.0–1.2)
Total Protein: 7.7 g/dL (ref 6.5–8.1)

## 2024-10-13 LAB — URINALYSIS, W/ REFLEX TO CULTURE (INFECTION SUSPECTED)
Bilirubin Urine: NEGATIVE
Glucose, UA: 150 mg/dL — AB
Hgb urine dipstick: NEGATIVE
Ketones, ur: NEGATIVE mg/dL
Leukocytes,Ua: NEGATIVE
Nitrite: NEGATIVE
Protein, ur: NEGATIVE mg/dL
Specific Gravity, Urine: 1.011 (ref 1.005–1.030)
pH: 6 (ref 5.0–8.0)

## 2024-10-13 LAB — TROPONIN T, HIGH SENSITIVITY: Troponin T High Sensitivity: 16 ng/L (ref 0–19)

## 2024-10-13 LAB — PROTIME-INR
INR: 0.9 (ref 0.8–1.2)
Prothrombin Time: 12.9 s (ref 11.4–15.2)

## 2024-10-13 LAB — LIPASE, BLOOD: Lipase: 23 U/L (ref 11–51)

## 2024-10-13 LAB — LACTIC ACID, PLASMA: Lactic Acid, Venous: 1.5 mmol/L (ref 0.5–1.9)

## 2024-10-13 MED ORDER — IOHEXOL 350 MG/ML SOLN
100.0000 mL | Freq: Once | INTRAVENOUS | Status: AC | PRN
  Administered 2024-10-13: 100 mL via INTRAVENOUS

## 2024-10-13 MED ORDER — SODIUM CHLORIDE 0.9 % IV BOLUS
1000.0000 mL | Freq: Once | INTRAVENOUS | Status: AC
  Administered 2024-10-13: 1000 mL via INTRAVENOUS

## 2024-10-13 NOTE — ED Provider Notes (Signed)
  EMERGENCY DEPARTMENT AT [facility] Provider Note   CSN: [id number] Arrival date & time: 10/13/24  2027     Patient presents with: Altered Mental Status (Pt discharged Sunday from admission here -Dx of admission was UTI)   Gabrielle Wood is a 78 y.o. female.  She is brought in by her son for altered mental status.  Son states she was just discharged from the hospital.  Eat breakfast today and went to urology follow-up.  He said he left to go to work when he came back this evening he found her confused and generally weak.  Needed a wheelchair to get her in the department.  No reported fevers vomiting diarrhea.  Still has a Foley catheter from discharge.  Discharge problem list was as AKI, failure to thrive, chronic right UPJ obstruction, urinary retention.  Level 5 caveat secondary to altered mental status.  Patient does endorse some shortness of breath and has some abdominal pain.  She also endorses a headache.  She thinks it is January and that she is at her sisters house.    The history is provided by the patient and a relative.  Altered Mental Status Presenting symptoms: confusion   Most recent episode:  Today Progression:  Unchanged Chronicity:  New Context: recent illness   Associated symptoms: abdominal pain and headaches   Associated symptoms: no fever and no vomiting        Prior to Admission medications   Medication Sig Start Date End Date Taking? Authorizing Provider  acetaminophen  (TYLENOL ) 500 MG tablet Take 1,000 mg by mouth in the morning and at bedtime.    [provider]  albuterol  (VENTOLIN  HFA) 108 (90 Base) MCG/ACT inhaler Inhale 1-2 puffs into the lungs every 4 (four) hours as needed for wheezing or shortness of breath. 09/30/23   Ula Prentice SAUNDERS, MD  ALPRAZolam  (XANAX ) 1 MG tablet Take 0.5 tablets (0.5 mg total) by mouth at bedtime. 0.5 tablet in am and 0.5 tab at night Patient taking differently: Take 0.5 mg by mouth at bedtime.  08/19/24   Cheryle Page, MD  aspirin  EC 81 MG tablet Take 1 tablet (81 mg total) by mouth daily. Swallow whole. 10/02/24   Mcarthur Pick, MD  atorvastatin  (LIPITOR) 80 MG tablet Take 80 mg by mouth every evening. 11/24/21   [provider]  baclofen (LIORESAL) 10 MG tablet Take 10 mg by mouth 2 (two) times daily as needed for muscle spasms. 08/06/24   [provider]  BRILINTA  60 MG TABS tablet Take 60 mg by mouth 2 (two) times daily. 09/07/24   [provider]  Continuous Glucose Sensor (FREESTYLE LIBRE 3 PLUS SENSOR) MISC Change sensor every 15 days. 09/16/24   Therisa Benton JINNY, NP  Fluticasone-Umeclidin-Vilant (TRELEGY ELLIPTA) 100-62.5-25 MCG/ACT AEPB Inhale 1 puff into the lungs daily.    [provider]  gabapentin  (NEURONTIN ) 100 MG capsule Take 1 capsule (100 mg total) by mouth 3 (three) times daily. 08/19/24   Cheryle Page, MD  insulin  degludec (TRESIBA  FLEXTOUCH) 100 UNIT/ML FlexTouch Pen Inject 26 Units into the skin at bedtime. Patient taking differently: Inject 26-32 Units into the skin at bedtime. 09/16/24   Therisa Benton JINNY, NP  insulin  lispro (HUMALOG  KWIKPEN) 100 UNIT/ML KwikPen Inject 8-14 Units into the skin 3 (three) times daily. Patient taking differently: Inject 8-15 Units into the skin 3 (three) times daily. 09/16/24   Therisa Benton JINNY, NP  levothyroxine  (SYNTHROID ) 175 MCG tablet Take 175 mcg by mouth  daily. 10/08/24   [provider]  magnesium  oxide (MAG-OX) 400 (240 Mg) MG tablet Take 400 mg by mouth daily.    [provider]  megestrol (MEGACE) 400 MG/10ML suspension Take 10 mLs (400 mg total) by mouth daily. 10/11/24   ShahmehdiAdriana LABOR, MD  metoprolol  succinate (TOPROL -XL) 25 MG 24 hr tablet Take 25 mg by mouth daily.    [provider]  mirabegron ER (MYRBETRIQ) 25 MG TB24 tablet Take 25 mg by mouth daily.    [provider]  nitroGLYCERIN (NITROSTAT) 0.4 MG SL tablet Place 0.4 mg under the  tongue every 5 (five) minutes as needed for chest pain.    [provider]  OXYGEN  Inhale 2 L/min into the lungs as needed (shortness of breath, low O2 sats).    [provider]  pantoprazole  (PROTONIX ) 40 MG tablet Take 40 mg by mouth daily. 06/06/21   [provider]  senna-docusate (SENOKOT-S) 8.6-50 MG tablet Take 1 tablet by mouth at bedtime as needed for mild constipation. 10/02/24   Sigdel, Santosh, MD  valsartan (DIOVAN) 40 MG tablet Take 40 mg by mouth 2 (two) times daily. Patient not taking: Reported on 10/13/2024 09/07/24   [provider]  fexofenadine (ALLEGRA) 60 MG tablet Take by mouth.  04/13/21  [provider]    Allergies: Amlodipine , Ciprocin-fluocin-procin [fluocinolone], Lipitor [atorvastatin ], Nebivolol, Nitrofuran derivatives, Lisinopril, and Red yeast rice [cholestin]    Review of Systems  Unable to perform ROS: Mental status change  Constitutional:  Negative for fever.  Gastrointestinal:  Positive for abdominal pain. Negative for vomiting.  Neurological:  Positive for headaches.  Psychiatric/Behavioral:  Positive for confusion.     Updated Vital Signs BP (!) 164/98   Pulse 90   Temp 98.7 F (37.1 C) (Oral)   Resp 18   Ht 5' 7 (1.702 m)   Wt 66.7 kg   SpO2 94%   BMI 23.02 kg/m   Physical Exam Vitals and nursing note reviewed.  Constitutional:      General: She is not in acute distress.    Appearance: She is well-developed.  HENT:     Head: Normocephalic and atraumatic.  Eyes:     Conjunctiva/sclera: Conjunctivae normal.  Cardiovascular:     Rate and Rhythm: Normal rate and regular rhythm.     Heart sounds: No murmur heard. Pulmonary:     Effort: Tachypnea present. No respiratory distress.     Breath sounds: Normal breath sounds.  Abdominal:     Palpations: Abdomen is soft.     Tenderness: There is abdominal tenderness (llq). There is no guarding or rebound.  Musculoskeletal:        General: No  swelling.     Cervical back: Neck supple.  Skin:    General: Skin is warm and dry.     Capillary Refill: Capillary refill takes less than 2 seconds.  Neurological:     General: No focal deficit present.     Mental Status: She is disoriented.     Motor: No weakness.     (all labs ordered are listed, but only abnormal results are displayed) Labs Reviewed  COMPREHENSIVE METABOLIC PANEL WITH GFR - Abnormal; Notable for the following components:      Result Value   Sodium 131 (*)    Chloride 96 (*)    CO2 21 (*)    Glucose, Bld 280 (*)    All other components within normal limits  CBC WITH DIFFERENTIAL/PLATELET - Abnormal; Notable  for the following components:   WBC 18.5 (*)    Hemoglobin 10.9 (*)    HCT 33.1 (*)    Neutro Abs 16.1 (*)    Abs Immature Granulocytes 0.31 (*)    All other components within normal limits  BLOOD GAS, VENOUS - Abnormal; Notable for the following components:   pH, Ven 7.49 (*)    pCO2, Ven 31 (*)    All other components within normal limits  URINALYSIS, W/ REFLEX TO CULTURE (INFECTION SUSPECTED) - Abnormal; Notable for the following components:   Glucose, UA 150 (*)    Bacteria, UA RARE (*)    All other components within normal limits  CBC WITH DIFFERENTIAL/PLATELET - Abnormal; Notable for the following components:   WBC 18.6 (*)    RBC 3.72 (*)    Hemoglobin 10.0 (*)    HCT 31.1 (*)    Neutro Abs 16.4 (*)    Abs Immature Granulocytes 0.35 (*)    All other components within normal limits  BASIC METABOLIC PANEL WITH GFR - Abnormal; Notable for the following components:   Sodium 133 (*)    Glucose, Bld 368 (*)    All other components within normal limits  MAGNESIUM  - Abnormal; Notable for the following components:   Magnesium  1.6 (*)    All other components within normal limits  CULTURE, BLOOD (ROUTINE X 2)  CULTURE, BLOOD (ROUTINE X 2)  MRSA NEXT GEN BY PCR, NASAL  LACTIC ACID, PLASMA  PROTIME-INR  LIPASE, BLOOD  PHOSPHORUS  TROPONIN T,  HIGH SENSITIVITY  TROPONIN T, HIGH SENSITIVITY    EKG: EKG Interpretation Date/Time:  Tuesday October 13 2024 21:11:35 EST Ventricular Rate:  108 PR Interval:  38 QRS Duration:  113 QT Interval:  332 QTC Calculation: 445 R Axis:   -39  Text Interpretation: Poor quality data, interpretation may be affected Sinus tachycardia Right atrial enlargement Incomplete left bundle branch block Left ventricular hypertrophy Artifact in lead(s) I II III aVR aVL aVF V1 V2 V3 V4 V5 V6 Confirmed by Towana Sharper [phone]) on 10/13/2024 9:13:49 PM  Radiology: CT Angio Chest PE W/Cm &/Or Wo Cm Result Date: 10/13/2024 CLINICAL DATA:  Sepsis EXAM: CT ANGIOGRAPHY CHEST CT ABDOMEN AND PELVIS WITH CONTRAST TECHNIQUE: Multidetector CT imaging of the chest was performed using the standard protocol during bolus administration of intravenous contrast. Multiplanar CT image reconstructions and MIPs were obtained to evaluate the vascular anatomy. Multidetector CT imaging of the abdomen and pelvis was performed using the standard protocol during bolus administration of intravenous contrast. RADIATION DOSE REDUCTION: This exam was performed according to the departmental dose-optimization program which includes automated exposure control, adjustment of the mA and/or kV according to patient size and/or use of iterative reconstruction technique. CONTRAST:  OMNIPAQUE  IOHEXOL  350 MG/ML SOLN COMPARISON:  CT 09/29/2024, 08/15/2024, 07/05/2022 FINDINGS: CTA CHEST FINDINGS Cardiovascular: Satisfactory opacification of the pulmonary arteries to the segmental level. No evidence of pulmonary embolism. Moderate aortic atherosclerosis. No aneurysm or dissection. Multi vessel coronary vascular calcification. Normal cardiac size. No pericardial effusion. Mediastinum/Nodes: Patent trachea. No thyroid  mass. Borderline AP window lymph node measuring 11 mm. Small right hilar nodes measuring up to 13 mm. Esophagus within normal limits.  Lungs/Pleura: Clearing of previously noted subpleural consolidations. Mild atelectasis and or scarring at the bases with trace pleural thickening. No significant effusion. Apical scarring. 3 mm left apical nodule versus nodular scarring series 10, image 19. Musculoskeletal: Sternum appears intact. No acute osseous abnormality Review of the MIP images confirms the  above findings. CT ABDOMEN and PELVIS FINDINGS Hepatobiliary: No focal liver abnormality is seen. No gallstones, gallbladder wall thickening, or biliary dilatation. Pancreas: Unremarkable. No pancreatic ductal dilatation or surrounding inflammatory changes. Spleen: Normal in size without focal abnormality. Adrenals/Urinary Tract: Adrenal glands are normal. Mild to moderate right-sided hydronephrosis to the UPJ, similar compared to prior. No left hydronephrosis. Renal cysts for which no imaging follow-up is recommended. Decompressed urinary bladder contains Foley catheter Stomach/Bowel: Stomach within normal limits. No dilated small bowel. No acute bowel wall thickening. Vascular/Lymphatic: Aortic atherosclerosis. No enlarged abdominal or pelvic lymph nodes. Reproductive: Uterus and bilateral adnexa are unremarkable. Other: Negative for ascites or free air Musculoskeletal: No acute or suspicious osseous abnormality. Review of the MIP images confirms the above findings. IMPRESSION: 1. Negative for acute pulmonary embolus. 2. Clearing of previously noted bilateral lower lobe subpleural consolidations. Mild atelectasis and or scarring at the bases with trace pleural thickening. 3. No CT evidence for acute intra-abdominal or pelvic abnormality. 4. Mild to moderate right-sided hydronephrosis to the UPJ, similar compared to recent prior exams, new/increased compared with 2023. 5. Aortic atherosclerosis. Aortic Atherosclerosis (ICD10-I70.0). Electronically Signed   By: Luke Bun M.D.   On: 10/13/2024 23:36   CT ABDOMEN PELVIS W CONTRAST Result Date:  10/13/2024 CLINICAL DATA:  Sepsis EXAM: CT ANGIOGRAPHY CHEST CT ABDOMEN AND PELVIS WITH CONTRAST TECHNIQUE: Multidetector CT imaging of the chest was performed using the standard protocol during bolus administration of intravenous contrast. Multiplanar CT image reconstructions and MIPs were obtained to evaluate the vascular anatomy. Multidetector CT imaging of the abdomen and pelvis was performed using the standard protocol during bolus administration of intravenous contrast. RADIATION DOSE REDUCTION: This exam was performed according to the departmental dose-optimization program which includes automated exposure control, adjustment of the mA and/or kV according to patient size and/or use of iterative reconstruction technique. CONTRAST:  OMNIPAQUE  IOHEXOL  350 MG/ML SOLN COMPARISON:  CT 09/29/2024, 08/15/2024, 07/05/2022 FINDINGS: CTA CHEST FINDINGS Cardiovascular: Satisfactory opacification of the pulmonary arteries to the segmental level. No evidence of pulmonary embolism. Moderate aortic atherosclerosis. No aneurysm or dissection. Multi vessel coronary vascular calcification. Normal cardiac size. No pericardial effusion. Mediastinum/Nodes: Patent trachea. No thyroid  mass. Borderline AP window lymph node measuring 11 mm. Small right hilar nodes measuring up to 13 mm. Esophagus within normal limits. Lungs/Pleura: Clearing of previously noted subpleural consolidations. Mild atelectasis and or scarring at the bases with trace pleural thickening. No significant effusion. Apical scarring. 3 mm left apical nodule versus nodular scarring series 10, image 19. Musculoskeletal: Sternum appears intact. No acute osseous abnormality Review of the MIP images confirms the above findings. CT ABDOMEN and PELVIS FINDINGS Hepatobiliary: No focal liver abnormality is seen. No gallstones, gallbladder wall thickening, or biliary dilatation. Pancreas: Unremarkable. No pancreatic ductal dilatation or surrounding inflammatory  changes. Spleen: Normal in size without focal abnormality. Adrenals/Urinary Tract: Adrenal glands are normal. Mild to moderate right-sided hydronephrosis to the UPJ, similar compared to prior. No left hydronephrosis. Renal cysts for which no imaging follow-up is recommended. Decompressed urinary bladder contains Foley catheter Stomach/Bowel: Stomach within normal limits. No dilated small bowel. No acute bowel wall thickening. Vascular/Lymphatic: Aortic atherosclerosis. No enlarged abdominal or pelvic lymph nodes. Reproductive: Uterus and bilateral adnexa are unremarkable. Other: Negative for ascites or free air Musculoskeletal: No acute or suspicious osseous abnormality. Review of the MIP images confirms the above findings. IMPRESSION: 1. Negative for acute pulmonary embolus. 2. Clearing of previously noted bilateral lower lobe subpleural consolidations. Mild atelectasis and or scarring  at the bases with trace pleural thickening. 3. No CT evidence for acute intra-abdominal or pelvic abnormality. 4. Mild to moderate right-sided hydronephrosis to the UPJ, similar compared to recent prior exams, new/increased compared with 2023. 5. Aortic atherosclerosis. Aortic Atherosclerosis (ICD10-I70.0). Electronically Signed   By: Luke Bun M.D.   On: 10/13/2024 23:36   CT Head Wo Contrast Result Date: 10/13/2024 EXAM: CT HEAD WITHOUT CONTRAST 10/13/2024 11:20:53 PM TECHNIQUE: CT of the head was performed without the administration of intravenous contrast. Automated exposure control, iterative reconstruction, and/or weight based adjustment of the mA/kV was utilized to reduce the radiation dose to as low as reasonably achievable. COMPARISON: 09/29/2024 CLINICAL HISTORY: Mental status change, unknown cause. FINDINGS: BRAIN AND VENTRICLES: No acute hemorrhage. No evidence of acute infarct. No hydrocephalus. No extra-axial collection. No mass effect or midline shift. There is atrophy and chronic small vessel disease  throughout the deep white matter. Old right thalamic lacunar infarct. ORBITS: No acute abnormality. SINUSES: No acute abnormality. SOFT TISSUES AND SKULL: No acute soft tissue abnormality. No skull fracture. IMPRESSION: 1. No acute intracranial abnormality. 2. Old right thalamic lacunar infarct. 3. Atrophy and chronic small vessel disease throughout the deep white matter. Electronically signed by: Franky Crease MD 10/13/2024 11:25 PM EST RP Workstation: [id number]   DG Chest Port 1 View Result Date: 10/13/2024 EXAM: 1 VIEW(S) XRAY OF THE CHEST 10/13/2024 09:15:00 PM COMPARISON: 10/07/2024 CLINICAL HISTORY: Questionable sepsis - evaluate for abnormality FINDINGS: LUNGS AND PLEURA: Hyperinflation compatible with emphysema. No focal pulmonary opacity. No pulmonary edema. No pleural effusion. No pneumothorax. HEART AND MEDIASTINUM: Aortic atherosclerosis. No acute abnormality of the cardiac and mediastinal silhouettes. BONES AND SOFT TISSUES: No acute osseous abnormality. IMPRESSION: 1. No acute cardiopulmonary abnormality. 2. Hyperinflation compatible with emphysema. 3. Aortic atherosclerosis. Electronically signed by: Franky Crease MD 10/13/2024 09:27 PM EST RP Workstation: [id number]     Procedures   Medications Ordered in the ED - No data to display  Clinical Course as of 10/14/24 0954  Tue Oct 13, 2024  2347 Discussed with Dr. Shona Triad hospitalist who will evaluate for admission. [MB]    Clinical Course User Index [MB] Towana Ozell BROCKS, MD                                 Medical Decision Making Amount and/or Complexity of Data Reviewed Labs: ordered. Radiology: ordered.  Risk Prescription drug management. Decision regarding hospitalization.   This patient complains of altered mental status weakness; this involves an extensive number of treatment Options and is a complaint that carries with it a high risk of complications and morbidity. The differential includes sepsis, dehydration,  stroke, medication side effect  I ordered, reviewed and interpreted labs, which included CBC with elevated white count, chemistries elevated glucose, lactate normal, VBG without acidosis, urinalysis without signs of infection, blood culture sent I ordered medication IV fluids and reviewed PMP when indicated. I ordered imaging studies which included chest x-ray, CT head, CT angio chest, CT abdomen and pelvis and I independently    visualized and interpreted imaging which showed no acute findings Additional history obtained from patient's son Previous records obtained and reviewed in epic including recent discharge summary I consulted Dr. Shona Triad hospitalist and discussed lab and imaging findings and discussed disposition.  Cardiac monitoring reviewed, sinus rhythm Social determinants considered, depression physically inactive Critical Interventions: None  After the interventions stated above, I reevaluated the patient and found  patient still to be confused Admission and further testing considered, she would benefit from mission the hospital for further workup.  Family is in agreement with plan for admission.      Final diagnoses:  Acute encephalopathy    ED Discharge Orders     None          Towana Ozell BROCKS, MD 10/14/24 [phone]

## 2024-10-13 NOTE — Progress Notes (Deleted)
 Bladder Scan completed today due to reason of incomplete bladder emptying  Patient {Gabrielle Wood:[id number]} void prior to the bladder scan. Bladder scan result: ***  Performed By: Exie T. CMA  Additional notes- Patient is scheduled to follow up with MD

## 2024-10-13 NOTE — ED Triage Notes (Addendum)
 Pt bib family from home, pt lives across street from son and friend lives close by, pt was discharged Sunday from [facility] after 4-5 day admission for UTI and dehydration. Pt son says pt had f/u appt with urology today and she was fine, but when he got home from work around 6 pm, pt was confused. Son reports pt was not sent home with anbx rx. Pt has foley in place.

## 2024-10-14 ENCOUNTER — Encounter (HOSPITAL_COMMUNITY): Payer: Self-pay | Admitting: Internal Medicine

## 2024-10-14 DIAGNOSIS — R627 Adult failure to thrive: Secondary | ICD-10-CM | POA: Diagnosis not present

## 2024-10-14 DIAGNOSIS — Z8249 Family history of ischemic heart disease and other diseases of the circulatory system: Secondary | ICD-10-CM | POA: Diagnosis not present

## 2024-10-14 DIAGNOSIS — E039 Hypothyroidism, unspecified: Secondary | ICD-10-CM | POA: Diagnosis not present

## 2024-10-14 DIAGNOSIS — N136 Pyonephrosis: Secondary | ICD-10-CM | POA: Diagnosis not present

## 2024-10-14 DIAGNOSIS — J9611 Chronic respiratory failure with hypoxia: Secondary | ICD-10-CM | POA: Diagnosis not present

## 2024-10-14 DIAGNOSIS — G9341 Metabolic encephalopathy: Secondary | ICD-10-CM | POA: Diagnosis not present

## 2024-10-14 DIAGNOSIS — R4182 Altered mental status, unspecified: Secondary | ICD-10-CM | POA: Diagnosis not present

## 2024-10-14 DIAGNOSIS — M6281 Muscle weakness (generalized): Secondary | ICD-10-CM | POA: Diagnosis not present

## 2024-10-14 DIAGNOSIS — F419 Anxiety disorder, unspecified: Secondary | ICD-10-CM | POA: Diagnosis not present

## 2024-10-14 DIAGNOSIS — F05 Delirium due to known physiological condition: Secondary | ICD-10-CM | POA: Diagnosis not present

## 2024-10-14 DIAGNOSIS — Z79890 Hormone replacement therapy (postmenopausal): Secondary | ICD-10-CM | POA: Diagnosis not present

## 2024-10-14 DIAGNOSIS — J449 Chronic obstructive pulmonary disease, unspecified: Secondary | ICD-10-CM | POA: Diagnosis not present

## 2024-10-14 DIAGNOSIS — R000 Tachycardia, unspecified: Secondary | ICD-10-CM | POA: Diagnosis not present

## 2024-10-14 DIAGNOSIS — I700 Atherosclerosis of aorta: Secondary | ICD-10-CM | POA: Diagnosis not present

## 2024-10-14 DIAGNOSIS — Z7902 Long term (current) use of antithrombotics/antiplatelets: Secondary | ICD-10-CM | POA: Diagnosis not present

## 2024-10-14 DIAGNOSIS — Z87891 Personal history of nicotine dependence: Secondary | ICD-10-CM | POA: Diagnosis not present

## 2024-10-14 DIAGNOSIS — Z7982 Long term (current) use of aspirin: Secondary | ICD-10-CM | POA: Diagnosis not present

## 2024-10-14 DIAGNOSIS — A419 Sepsis, unspecified organism: Secondary | ICD-10-CM | POA: Diagnosis not present

## 2024-10-14 DIAGNOSIS — N281 Cyst of kidney, acquired: Secondary | ICD-10-CM | POA: Diagnosis not present

## 2024-10-14 DIAGNOSIS — Z794 Long term (current) use of insulin: Secondary | ICD-10-CM | POA: Diagnosis not present

## 2024-10-14 DIAGNOSIS — M069 Rheumatoid arthritis, unspecified: Secondary | ICD-10-CM | POA: Diagnosis not present

## 2024-10-14 DIAGNOSIS — I2510 Atherosclerotic heart disease of native coronary artery without angina pectoris: Secondary | ICD-10-CM | POA: Diagnosis not present

## 2024-10-14 DIAGNOSIS — E119 Type 2 diabetes mellitus without complications: Secondary | ICD-10-CM | POA: Diagnosis not present

## 2024-10-14 DIAGNOSIS — E860 Dehydration: Secondary | ICD-10-CM | POA: Diagnosis not present

## 2024-10-14 DIAGNOSIS — I10 Essential (primary) hypertension: Secondary | ICD-10-CM | POA: Diagnosis not present

## 2024-10-14 DIAGNOSIS — R5381 Other malaise: Secondary | ICD-10-CM | POA: Diagnosis not present

## 2024-10-14 DIAGNOSIS — Z9981 Dependence on supplemental oxygen: Secondary | ICD-10-CM | POA: Diagnosis not present

## 2024-10-14 DIAGNOSIS — I252 Old myocardial infarction: Secondary | ICD-10-CM | POA: Diagnosis not present

## 2024-10-14 DIAGNOSIS — J929 Pleural plaque without asbestos: Secondary | ICD-10-CM | POA: Diagnosis not present

## 2024-10-14 DIAGNOSIS — N139 Obstructive and reflux uropathy, unspecified: Secondary | ICD-10-CM | POA: Diagnosis not present

## 2024-10-14 DIAGNOSIS — E7800 Pure hypercholesterolemia, unspecified: Secondary | ICD-10-CM | POA: Diagnosis not present

## 2024-10-14 DIAGNOSIS — G9340 Encephalopathy, unspecified: Secondary | ICD-10-CM | POA: Diagnosis not present

## 2024-10-14 DIAGNOSIS — R6510 Systemic inflammatory response syndrome (SIRS) of non-infectious origin without acute organ dysfunction: Secondary | ICD-10-CM | POA: Diagnosis not present

## 2024-10-14 LAB — CBC WITH DIFFERENTIAL/PLATELET
Abs Immature Granulocytes: 0.35 K/uL — ABNORMAL HIGH (ref 0.00–0.07)
Basophils Absolute: 0.1 K/uL (ref 0.0–0.1)
Basophils Relative: 0 %
Eosinophils Absolute: 0.1 K/uL (ref 0.0–0.5)
Eosinophils Relative: 0 %
HCT: 31.1 % — ABNORMAL LOW (ref 36.0–46.0)
Hemoglobin: 10 g/dL — ABNORMAL LOW (ref 12.0–15.0)
Immature Granulocytes: 2 %
Lymphocytes Relative: 4 %
Lymphs Abs: 0.7 K/uL (ref 0.7–4.0)
MCH: 26.9 pg (ref 26.0–34.0)
MCHC: 32.2 g/dL (ref 30.0–36.0)
MCV: 83.6 fL (ref 80.0–100.0)
Monocytes Absolute: 1 K/uL (ref 0.1–1.0)
Monocytes Relative: 5 %
Neutro Abs: 16.4 K/uL — ABNORMAL HIGH (ref 1.7–7.7)
Neutrophils Relative %: 89 %
Platelets: 290 K/uL (ref 150–400)
RBC: 3.72 MIL/uL — ABNORMAL LOW (ref 3.87–5.11)
RDW: 15.2 % (ref 11.5–15.5)
WBC: 18.6 K/uL — ABNORMAL HIGH (ref 4.0–10.5)
nRBC: 0 % (ref 0.0–0.2)

## 2024-10-14 LAB — BASIC METABOLIC PANEL WITH GFR
Anion gap: 13 (ref 5–15)
BUN: 15 mg/dL (ref 8–23)
CO2: 22 mmol/L (ref 22–32)
Calcium: 9.2 mg/dL (ref 8.9–10.3)
Chloride: 98 mmol/L (ref 98–111)
Creatinine, Ser: 0.81 mg/dL (ref 0.44–1.00)
GFR, Estimated: >60 mL/min (ref >60)
Glucose, Bld: 368 mg/dL — ABNORMAL HIGH (ref 70–99)
Potassium: 3.6 mmol/L (ref 3.5–5.1)
Sodium: 133 mmol/L — ABNORMAL LOW (ref 135–145)

## 2024-10-14 LAB — MAGNESIUM: Magnesium: 1.6 mg/dL — ABNORMAL LOW (ref 1.7–2.4)

## 2024-10-14 LAB — GLUCOSE, CAPILLARY
Glucose-Capillary: 369 mg/dL — ABNORMAL HIGH (ref 70–99)
Glucose-Capillary: 386 mg/dL — ABNORMAL HIGH (ref 70–99)
Glucose-Capillary: 202 mg/dL — ABNORMAL HIGH (ref 70–99)

## 2024-10-14 LAB — PHOSPHORUS: Phosphorus: 2.6 mg/dL (ref 2.5–4.6)

## 2024-10-14 LAB — TROPONIN T, HIGH SENSITIVITY: Troponin T High Sensitivity: <15 ng/L (ref 0–19)

## 2024-10-14 MED ORDER — LEVOTHYROXINE SODIUM 50 MCG PO TABS
175.0000 ug | ORAL_TABLET | Freq: Every day | ORAL | Status: DC
  Administered 2024-10-14 – 2024-10-15 (×2): 175 ug via ORAL
  Filled 2024-10-14 (×2): qty 1

## 2024-10-14 MED ORDER — MELATONIN 3 MG PO TABS: 6.0000 mg | ORAL_TABLET | Freq: Every evening | ORAL | Status: DC | PRN

## 2024-10-14 MED ORDER — SODIUM CHLORIDE 0.9 % IV SOLN: INTRAVENOUS | Status: AC

## 2024-10-14 MED ORDER — ENOXAPARIN SODIUM 40 MG/0.4ML IJ SOSY
40.0000 mg | PREFILLED_SYRINGE | Freq: Every day | INTRAMUSCULAR | Status: DC
  Administered 2024-10-14 – 2024-10-15 (×2): 40 mg via SUBCUTANEOUS
  Filled 2024-10-14 (×2): qty 0.4

## 2024-10-14 MED ORDER — PIPERACILLIN-TAZOBACTAM 3.375 G IVPB
3.3750 g | Freq: Three times a day (TID) | INTRAVENOUS | Status: DC
  Administered 2024-10-14 – 2024-10-15 (×4): 3.375 g via INTRAVENOUS
  Filled 2024-10-14 (×4): qty 50

## 2024-10-14 MED ORDER — PROCHLORPERAZINE EDISYLATE 10 MG/2ML IJ SOLN
5.0000 mg | Freq: Four times a day (QID) | INTRAMUSCULAR | Status: DC | PRN
  Administered 2024-10-14: 5 mg via INTRAVENOUS
  Filled 2024-10-14: qty 2

## 2024-10-14 MED ORDER — INSULIN ASPART 100 UNIT/ML IJ SOLN
0.0000 [IU] | Freq: Every day | INTRAMUSCULAR | Status: DC
  Administered 2024-10-14: 2 [IU] via SUBCUTANEOUS
  Filled 2024-10-14: qty 1

## 2024-10-14 MED ORDER — ACETAMINOPHEN 500 MG PO TABS
500.0000 mg | ORAL_TABLET | Freq: Four times a day (QID) | ORAL | Status: DC | PRN
Start: 2024-10-14 — End: 2024-10-15
  Administered 2024-10-14: 500 mg via ORAL
  Filled 2024-10-14: qty 1

## 2024-10-14 MED ORDER — METOCLOPRAMIDE HCL 5 MG/ML IJ SOLN
5.0000 mg | Freq: Three times a day (TID) | INTRAMUSCULAR | Status: DC | PRN
Start: 2024-10-14 — End: 2024-10-15

## 2024-10-14 MED ORDER — NITROFURANTOIN MACROCRYSTAL 100 MG PO CAPS
100.0000 mg | ORAL_CAPSULE | Freq: Every day | ORAL | Status: DC
  Filled 2024-10-14: qty 1

## 2024-10-14 MED ORDER — INSULIN ASPART 100 UNIT/ML IJ SOLN
3.0000 [IU] | Freq: Three times a day (TID) | INTRAMUSCULAR | Status: DC
  Administered 2024-10-14 – 2024-10-15 (×3): 3 [IU] via SUBCUTANEOUS
  Filled 2024-10-14 (×3): qty 1

## 2024-10-14 MED ORDER — NITROFURANTOIN MONOHYD MACRO 100 MG PO CAPS
100.0000 mg | ORAL_CAPSULE | Freq: Every day | ORAL | Status: DC
  Administered 2024-10-14: 100 mg via ORAL
  Filled 2024-10-14: qty 1

## 2024-10-14 MED ORDER — INSULIN ASPART 100 UNIT/ML IJ SOLN
0.0000 [IU] | Freq: Three times a day (TID) | INTRAMUSCULAR | Status: DC
  Administered 2024-10-14: 15 [IU] via SUBCUTANEOUS
  Administered 2024-10-15: 8 [IU] via SUBCUTANEOUS
  Administered 2024-10-15: 2 [IU] via SUBCUTANEOUS
  Filled 2024-10-14 (×3): qty 1

## 2024-10-14 MED ORDER — POLYETHYLENE GLYCOL 3350 17 G PO PACK: 17.0000 g | PACK | Freq: Every day | ORAL | Status: DC | PRN

## 2024-10-14 MED ORDER — CHLORHEXIDINE GLUCONATE CLOTH 2 % EX PADS
6.0000 | MEDICATED_PAD | Freq: Every day | CUTANEOUS | Status: DC
  Administered 2024-10-14 – 2024-10-15 (×2): 6 via TOPICAL

## 2024-10-14 MED ORDER — ALPRAZOLAM 0.5 MG PO TABS
0.5000 mg | ORAL_TABLET | Freq: Every day | ORAL | Status: DC
  Administered 2024-10-14 (×2): 0.5 mg via ORAL
  Filled 2024-10-14 (×2): qty 1

## 2024-10-14 MED ORDER — INSULIN GLARGINE-YFGN 100 UNIT/ML ~~LOC~~ SOLN
20.0000 [IU] | Freq: Every day | SUBCUTANEOUS | Status: DC
  Administered 2024-10-14 – 2024-10-15 (×2): 20 [IU] via SUBCUTANEOUS
  Filled 2024-10-14 (×3): qty 0.2

## 2024-10-14 NOTE — Evaluation (Signed)
 Physical Therapy Evaluation Patient Details Name: Gabrielle Wood MRN: [id number] DOB: December 29, 1945 Today's Date: 10/14/2024  History of Present Illness  Gabrielle Wood is a 78 y.o. female with medical history significant for hypertension, type 2 diabetes, hyperlipidemia, coronary artery disease status post PCI with stenting, hypothyroidism, GERD, who presents to the ER from home due to altered mental status.  Recently admitted from 11/5 through 10/11/2024 due to dehydration with AKI, failure to thrive, chronic right UPJ obstruction, and acute urinary retention with Foley catheter placed.  The patient had been doing fine up until today.  She went to her urologist and after she returned home, about 6 hours later, she was noted to be confused and agitated.  She was brought to the ER for further evaluation.     In the ER, alert confused and unable to provide a history.  Called her son to obtain a history, with no answer.  Noncontrast head CT was unrevealing.     Labs were notable for leukocytosis and bands.  UA negative for pyuria.  Chest x-ray was nonacute.  Due to concern for underlying infection, peripheral blood cultures x 2 were obtained, IV antibiotics were initiated.  Admitted by [facility], hospitalist service.   Clinical Impression  Patient was agreeable to completing today's PT evaluation. She was lethargic initially, but this improved as she transferred from supine to sitting EOB. She was CGA with all transfers and ambulation. However, she required the use of a RW for sit to stand transfers and ambulation. Fatigue was her primary limitation with ambulation. She was left sitting in the chair with the chair alarm set, call bell within reach, and nursing notified. Patient will benefit from continued skilled physical therapy in hospital and recommended venue below to increase strength, balance, endurance for safe ADLs and gait.       If plan is discharge home, recommend the following: A little help with  walking and/or transfers;A little help with bathing/dressing/bathroom;Assist for transportation;Assistance with cooking/housework;Help with stairs or ramp for entrance   Can travel by private vehicle        Equipment Recommendations None recommended by PT  Recommendations for Other Services       Functional Status Assessment Patient has had a recent decline in their functional status and demonstrates the ability to make significant improvements in function in a reasonable and predictable amount of time.     Precautions / Restrictions Precautions Precautions: Fall Recall of Precautions/Restrictions: Intact Restrictions Weight Bearing Restrictions Per Provider Order: No      Mobility  Bed Mobility Overal bed mobility: Needs Assistance Bed Mobility: Supine to Sit     Supine to sit: Contact guard, Used rails     General bed mobility comments: slow and labored movement; used bed rails    Transfers Overall transfer level: Needs assistance Equipment used: Rolling walker (2 wheels) Transfers: Sit to/from Stand, Bed to chair/wheelchair/BSC Sit to Stand: Contact guard assist   Step pivot transfers: Contact guard assist       General transfer comment: slow labored movement    Ambulation/Gait Ambulation/Gait assistance: Contact guard assist Gait Distance (Feet): 18 Feet Assistive device: Rolling walker (2 wheels) Gait Pattern/deviations: Decreased step length - right, Decreased step length - left, Decreased stride length Gait velocity: decreased     General Gait Details: slow and labored movement;; limited due to fatigue  Stairs            Wheelchair Mobility     Tilt Bed  Modified Rankin (Stroke Patients Only)       Balance Overall balance assessment: Needs assistance Sitting-balance support: Feet supported, No upper extremity supported Sitting balance-Leahy Scale: Good Sitting balance - Comments: seated at EOB; pt was able to don socks    Standing balance support: Reliant on assistive device for balance, During functional activity, Bilateral upper extremity supported Standing balance-Leahy Scale: Fair Standing balance comment: with RW                             Pertinent Vitals/Pain Pain Assessment Pain Assessment: Faces Faces Pain Scale: Hurts a little bit Pain Location: Legs Pain Descriptors / Indicators: Grimacing Pain Intervention(s): Monitored during session, Repositioned    Home Living Family/patient expects to be discharged to:: Private residence Living Arrangements: Alone Available Help at Discharge: Family;Available PRN/intermittently Type of Home: House Home Access: Stairs to enter Entrance Stairs-Rails: Right;Left;Can reach both Entrance Stairs-Number of Steps: 3   Home Layout: One level Home Equipment: Agricultural Consultant (2 wheels);Shower seat;Shower seat - built in;Grab bars - tub/shower;Wheelchair - manual Additional Comments: Patient was able to confirm information from prior hospitalization. Pt. 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 ADLs Comments: A little assist for bathing, dressing, and assisted for IADL's.     Extremity/Trunk Assessment   Upper Extremity Assessment Upper Extremity Assessment: Defer to OT evaluation    Lower Extremity Assessment Lower Extremity Assessment: Generalized weakness    Cervical / Trunk Assessment Cervical / Trunk Assessment: Normal  Communication   Communication Communication: Impaired Factors Affecting Communication: Hearing impaired    Cognition Arousal: Alert Behavior During Therapy: WFL for tasks assessed/performed    PT - Cognitive impairments: No apparent impairments                       PT - Cognition Comments: Patient was lethargic initially, but became alert when sitting EOB Following commands: Impaired Following commands impaired: Follows one step commands with increased time     Cueing Cueing Techniques: Verbal cues, Tactile cues, Gestural cues     General Comments      Exercises     Assessment/Plan    PT Assessment Patient needs continued PT services  PT Problem List Decreased strength;Decreased activity tolerance;Decreased balance;Decreased mobility       PT Treatment Interventions DME instruction;Gait training;Stair training;Functional mobility training;Therapeutic activities;Therapeutic exercise;Balance training;Patient/family education    PT Goals (Current goals can be found in the Care Plan section)  Acute Rehab PT Goals Patient Stated Goal: return home with HHPT. PT Goal Formulation: With patient Time For Goal Achievement: 10/28/24 Potential to Achieve Goals: Good    Frequency Min 3X/week     Co-evaluation               AM-PAC PT 6 Clicks Mobility  Outcome Measure Help needed turning from your back to your side while in a flat bed without using bedrails?: A Little Help needed moving from lying on your back to sitting on the side of a flat bed without using bedrails?: A Little Help needed moving to and from a bed to a chair (including a wheelchair)?: A Little Help needed standing up from a chair  using your arms (e.g., wheelchair or bedside chair)?: A Little Help needed to walk in hospital room?: A Little Help needed climbing 3-5 steps with a railing? : A Lot 6 Click Score: 17    End of Session Equipment Utilized During Treatment: Gait belt Activity Tolerance: Patient tolerated treatment well;Patient limited by fatigue Patient left: in chair;with call bell/phone within reach;with chair alarm set Nurse Communication: Mobility status PT Visit  Diagnosis: Muscle weakness (generalized) (M62.81);Other abnormalities of gait and mobility (R26.89);Difficulty in walking, not elsewhere classified (R26.2)    Time: 9242-9164 PT Time Calculation (min) (ACUTE ONLY): 38 min   Charges:   PT Evaluation $PT Eval Moderate Complexity: 1 Mod PT Treatments $Therapeutic Activity: 23-37 mins PT General Charges $$ ACUTE PT VISIT: 1 Visit         Gabrielle Wood, PT, DPT   Gabrielle Wood 10/14/2024, 11:33 AM

## 2024-10-14 NOTE — Plan of Care (Signed)
  Problem: Acute Rehab PT Goals(only PT should resolve) Goal: Pt Will Go Supine/Side To Sit Outcome: Progressing Flowsheets (Taken 10/14/2024 1135) Pt will go Supine/Side to Sit:  with modified independence  with supervision Goal: Patient Will Transfer Sit To/From Stand Outcome: Progressing Flowsheets (Taken 10/14/2024 1135) Patient will transfer sit to/from stand: with supervision Goal: Pt Will Transfer Bed To Chair/Chair To Bed Outcome: Progressing Flowsheets (Taken 10/14/2024 1135) Pt will Transfer Bed to Chair/Chair to Bed: with supervision Goal: Pt Will Ambulate Outcome: Progressing Flowsheets (Taken 10/14/2024 1135) Pt will Ambulate:  50 feet  with supervision  with rolling walker

## 2024-10-14 NOTE — H&P (Signed)
 History and Physical  Gabrielle Wood FMW:[id number] DOB: 1945/12/20 DOA: 10/13/2024  Referring physician: Dr. Towana, EDP  PCP: [name], MD  Outpatient Specialists: Urology. Patient coming from: Home.  Chief Complaint: Altered mental status.  HPI: Gabrielle Wood is a 78 y.o. female with medical history significant for hypertension, type 2 diabetes, hyperlipidemia, coronary artery disease status post PCI with stenting, hypothyroidism, GERD, who presents to the ER from home due to altered mental status.  Recently admitted from 11/5 through 10/11/2024 due to dehydration with AKI, failure to thrive, chronic right UPJ obstruction, and acute urinary retention with Foley catheter placed.  The patient had been doing fine up until today.  She went to her urologist and after she returned home, about 6 hours later, she was noted to be confused and agitated.  She was brought to the ER for further evaluation.  In the ER, alert confused and unable to provide a history.  Called her son to obtain a history, with no answer.  Noncontrast head CT was unrevealing.  Labs were notable for leukocytosis and bands.  UA negative for pyuria.  Chest x-ray was nonacute.  Due to concern for underlying infection, peripheral blood cultures x 2 were obtained, IV antibiotics were initiated.  Admitted by [facility], hospitalist service.  ED Course: Temperature 97.7.  BP 111/67, pulse 96, respiration rate 24.  O2 saturation 97% on 2 L.  Review of Systems: Review of systems as noted in the HPI. All other systems reviewed and are negative.   Past Medical History:  Diagnosis Date   Age-related physical debility 11/17/2021   AKI (acute kidney injury) 09/29/2024   Anxiety 05/28/2023   Arthritis    CAP (community acquired pneumonia) 10/14/2021   Chronic hypoxic respiratory failure (HCC) 06/23/2024   Constipation 06/26/2024   COPD (chronic obstructive pulmonary disease) (HCC)    COPD exacerbation (HCC) 05/27/2024    Diabetes mellitus    DKA (diabetic ketoacidosis) (HCC) 10/14/2021   DVT (deep venous thrombosis) (HCC)    years ago   Failure to thrive in adult 10/07/2024   Gastritis and duodenitis 04/05/2012   GERD (gastroesophageal reflux disease)    H. 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  Pathology with chronic H. pylori gastritis.   EUS  05/14/2012   Normal pancreas, gallbladder,biliary tree/ Incidental finding of simple-appearing right renal cyst.   TONSILLECTOMY     TUBAL LIGATION     VEIN SURGERY     stripping years ago but recent laser surgery; bilateral legs    Social History:  reports that she has quit smoking. Her smoking use included cigarettes. She has never used smokeless tobacco.  She reports that she does not drink alcohol  and does not use drugs.   Allergies  Allergen Reactions   Amlodipine  Other (See Comments)    Unknown    Ciprocin-Fluocin-Procin [Fluocinolone] Other (See Comments)    Nightmares   Lipitor [Atorvastatin ] Other (See Comments)    Muscle aches, pain   Nebivolol Other (See Comments)    Unknown    Nitrofuran Derivatives Other (See Comments)    Unknown     Lisinopril Cough   Red Yeast Rice [Cholestin] Itching and Rash    Family History  Problem Relation Age of Onset   Pancreatic cancer Mother        diagnosed at age 41s, died from heart attack   Inflammatory bowel disease Brother        crohn's, his dgt deceased from colon cancer at age 38   Colon cancer Neg Hx       Prior to Admission medications   Medication Sig Start Date End Date Taking? Authorizing Provider  acetaminophen  (TYLENOL ) 500 MG tablet Take 1,000 mg by mouth in the morning and at bedtime.   Yes [provider]  albuterol  (VENTOLIN  HFA) 108 (90 Base) MCG/ACT inhaler Inhale 1-2 puffs into the lungs every 4 (four) hours as needed for wheezing or shortness of breath. 09/30/23  Yes Ula Prentice SAUNDERS, MD  ALPRAZolam  (XANAX ) 1 MG tablet Take 0.5 tablets (0.5 mg total) by mouth at bedtime. 0.5 tablet in am and 0.5 tab at night Patient taking differently: Take 0.5 mg by mouth at bedtime. 08/19/24  Yes Cheryle Page, MD  aspirin  EC 81 MG tablet Take 1 tablet (81 mg total) by mouth daily. Swallow whole. 10/02/24  Yes Sigdel, Santosh, MD  atorvastatin  (LIPITOR) 80 MG tablet Take 80 mg by mouth daily. 11/24/21  Yes [provider]  baclofen (LIORESAL) 10 MG tablet Take 10 mg by mouth 2 (two) times daily as needed for muscle spasms. 08/06/24  Yes [provider]  BRILINTA  60 MG TABS tablet Take 60 mg by mouth 2 (two) times daily. 09/07/24  Yes [provider]  Fluticasone-Umeclidin-Vilant (TRELEGY ELLIPTA) 100-62.5-25 MCG/ACT AEPB Inhale 1 puff into the lungs daily.   Yes [provider]  gabapentin  (NEURONTIN ) 100 MG capsule Take 1 capsule (100 mg total) by mouth 3 (three) times daily. 08/19/24  Yes Cheryle Page, MD  insulin  degludec (TRESIBA  FLEXTOUCH) 100 UNIT/ML FlexTouch Pen Inject 26 Units into the skin at bedtime. 09/16/24  Yes Therisa Benton PARAS, NP  insulin  lispro (HUMALOG  KWIKPEN) 100 UNIT/ML KwikPen Inject 8-14 Units into the skin 3  (three) times daily. Patient taking differently: Inject 8-15 Units into the skin 3 (three) times daily. 09/16/24  Yes Therisa Benton PARAS, NP  levothyroxine  (SYNTHROID ) 175 MCG tablet Take 175 mcg by mouth daily. 10/08/24  Yes [provider]  magnesium  oxide (MAG-OX) 400 (240 Mg) MG tablet Take 400 mg by mouth daily.   Yes [provider]  megestrol (MEGACE) 400 MG/10ML suspension Take 10 mLs (400 mg total) by mouth daily. 10/11/24  Yes Shahmehdi, Adriana LABOR, MD  metoprolol  succinate (TOPROL -XL) 25 MG 24 hr tablet Take 25 mg by mouth daily.   Yes [provider]  nitrofurantoin (MACRODANTIN) 100 MG capsule Take 100 mg by mouth daily.   Yes [provider]  nitroGLYCERIN (NITROSTAT) 0.4 MG SL tablet Place 0.4 mg under the tongue every 5 (five) minutes as needed for chest pain.   Yes [provider]  OXYGEN  Inhale 2 L/min into the  lungs at bedtime.   Yes [provider]  pantoprazole  (PROTONIX ) 40 MG tablet Take 40 mg by mouth daily. 06/06/21  Yes [provider]  senna-docusate (SENOKOT-S) 8.6-50 MG tablet Take 1 tablet by mouth at bedtime as needed for mild constipation. 10/02/24  Yes Sigdel, Santosh, MD  Continuous Glucose Sensor (FREESTYLE LIBRE 3 PLUS SENSOR) MISC Change sensor every 15 days. 09/16/24   Therisa Benton PARAS, NP  valsartan (DIOVAN) 40 MG tablet Take 40 mg by mouth 2 (two) times daily. 09/07/24   [provider]  fexofenadine (ALLEGRA) 60 MG tablet Take by mouth.  04/13/21  [provider]    Physical Exam: BP 123/77   Pulse 99   Temp 98 F (36.7 C) (Oral)   Resp (!) 27   Ht 5' 7 (1.702 m)   Wt 66.7 kg   SpO2 96%   BMI 23.02 kg/m   General: 78 y.o. year-old female well developed well nourished in no acute distress.  Alert and confused. Cardiovascular: Regular rate and rhythm with no rubs or gallops.  No thyromegaly or JVD noted.  No lower extremity edema. 2/4 pulses in all 4  extremities. Respiratory: Faint rales at bases.  Poor inspiratory effort. Abdomen: Soft nontender nondistended with normal bowel sounds x4 quadrants. Muskuloskeletal: No cyanosis, clubbing or edema noted bilaterally Neuro: CN II-XII intact, strength, sensation, reflexes Skin: No ulcerative lesions noted or rashes Psychiatry: Judgement and insight appear altered. Mood is appropriate for condition and setting          Labs on Admission:  Basic Metabolic Panel: Recent Labs  Lab 10/07/24 1114 10/08/24 0317 10/09/24 0352 10/13/24 2136  NA 132* 137 136 131*  K 4.0 3.9 4.0 3.8  CL 97* 106 102 96*  CO2 21* 21* 22 21*  GLUCOSE 321* 197* 253* 280*  BUN 25* 18 14 18   CREATININE 1.28* 0.78 0.74 0.83  CALCIUM  9.6 9.6 9.7 9.9  MG  --  1.9 2.0  --    Liver Function Tests: Recent Labs  Lab 10/07/24 1114 10/13/24 2136  AST 19 20  ALT 16 14  ALKPHOS 55 60  BILITOT 0.7 0.6  PROT 7.2 7.7  ALBUMIN 3.7 4.0   Recent Labs  Lab 10/13/24 2136  LIPASE 23   No results for input(s): AMMONIA in the last 168 hours. CBC: Recent Labs  Lab 10/07/24 1114 10/08/24 0317 10/13/24 2136  WBC 21.6* 9.2 18.5*  NEUTROABS 19.7* 7.0 16.1*  HGB 10.2* 9.3* 10.9*  HCT 31.3* 29.0* 33.1*  MCV 85.5 84.5 82.8  PLT 234 221 305   Cardiac Enzymes: No results for input(s): CKTOTAL, CKMB, CKMBINDEX, TROPONINI in the last 168 hours.  BNP (last 3 results) No results for input(s): BNP in the last 8760 hours.  ProBNP (last 3 results) Recent Labs    10/07/24 1114  PROBNP 949.0*    CBG: Recent Labs  Lab 10/10/24 1639 10/10/24 2042 10/11/24 0248 10/11/24 0748 10/11/24 1107  GLUCAP 75 318* 253* 183* 172*    Radiological Exams on Admission: CT Angio Chest PE W/Cm &/Or Wo Cm Result Date: 10/13/2024 CLINICAL DATA:  Sepsis EXAM: CT ANGIOGRAPHY CHEST CT ABDOMEN AND PELVIS WITH CONTRAST TECHNIQUE: Multidetector CT imaging of the chest was performed using the standard protocol during bolus  administration of intravenous contrast. Multiplanar CT image reconstructions and MIPs were obtained to evaluate the vascular anatomy. Multidetector CT imaging of the abdomen and pelvis was performed using the standard protocol during bolus administration of intravenous contrast. RADIATION  DOSE REDUCTION: This exam was performed according to the departmental dose-optimization program which includes automated exposure control, adjustment of the mA and/or kV according to patient size and/or use of iterative reconstruction technique. CONTRAST:  OMNIPAQUE  IOHEXOL  350 MG/ML SOLN COMPARISON:  CT 09/29/2024, 08/15/2024, 07/05/2022 FINDINGS: CTA CHEST FINDINGS Cardiovascular: Satisfactory opacification of the pulmonary arteries to the segmental level. No evidence of pulmonary embolism. Moderate aortic atherosclerosis. No aneurysm or dissection. Multi vessel coronary vascular calcification. Normal cardiac size. No pericardial effusion. Mediastinum/Nodes: Patent trachea. No thyroid  mass. Borderline AP window lymph node measuring 11 mm. Small right hilar nodes measuring up to 13 mm. Esophagus within normal limits. Lungs/Pleura: Clearing of previously noted subpleural consolidations. Mild atelectasis and or scarring at the bases with trace pleural thickening. No significant effusion. Apical scarring. 3 mm left apical nodule versus nodular scarring series 10, image 19. Musculoskeletal: Sternum appears intact. No acute osseous abnormality Review of the MIP images confirms the above findings. CT ABDOMEN and PELVIS FINDINGS Hepatobiliary: No focal liver abnormality is seen. No gallstones, gallbladder wall thickening, or biliary dilatation. Pancreas: Unremarkable. No pancreatic ductal dilatation or surrounding inflammatory changes. Spleen: Normal in size without focal abnormality. Adrenals/Urinary Tract: Adrenal glands are normal. Mild to moderate right-sided hydronephrosis to the UPJ, similar compared to prior. No left  hydronephrosis. Renal cysts for which no imaging follow-up is recommended. Decompressed urinary bladder contains Foley catheter Stomach/Bowel: Stomach within normal limits. No dilated small bowel. No acute bowel wall thickening. Vascular/Lymphatic: Aortic atherosclerosis. No enlarged abdominal or pelvic lymph nodes. Reproductive: Uterus and bilateral adnexa are unremarkable. Other: Negative for ascites or free air Musculoskeletal: No acute or suspicious osseous abnormality. Review of the MIP images confirms the above findings. IMPRESSION: 1. Negative for acute pulmonary embolus. 2. Clearing of previously noted bilateral lower lobe subpleural consolidations. Mild atelectasis and or scarring at the bases with trace pleural thickening. 3. No CT evidence for acute intra-abdominal or pelvic abnormality. 4. Mild to moderate right-sided hydronephrosis to the UPJ, similar compared to recent prior exams, new/increased compared with 2023. 5. Aortic atherosclerosis. Aortic Atherosclerosis (ICD10-I70.0). Electronically Signed   By: Luke Bun M.D.   On: 10/13/2024 23:36   CT ABDOMEN PELVIS W CONTRAST Result Date: 10/13/2024 CLINICAL DATA:  Sepsis EXAM: CT ANGIOGRAPHY CHEST CT ABDOMEN AND PELVIS WITH CONTRAST TECHNIQUE: Multidetector CT imaging of the chest was performed using the standard protocol during bolus administration of intravenous contrast. Multiplanar CT image reconstructions and MIPs were obtained to evaluate the vascular anatomy. Multidetector CT imaging of the abdomen and pelvis was performed using the standard protocol during bolus administration of intravenous contrast. RADIATION DOSE REDUCTION: This exam was performed according to the departmental dose-optimization program which includes automated exposure control, adjustment of the mA and/or kV according to patient size and/or use of iterative reconstruction technique. CONTRAST:  OMNIPAQUE  IOHEXOL  350 MG/ML SOLN COMPARISON:  CT 09/29/2024,  08/15/2024, 07/05/2022 FINDINGS: CTA CHEST FINDINGS Cardiovascular: Satisfactory opacification of the pulmonary arteries to the segmental level. No evidence of pulmonary embolism. Moderate aortic atherosclerosis. No aneurysm or dissection. Multi vessel coronary vascular calcification. Normal cardiac size. No pericardial effusion. Mediastinum/Nodes: Patent trachea. No thyroid  mass. Borderline AP window lymph node measuring 11 mm. Small right hilar nodes measuring up to 13 mm. Esophagus within normal limits. Lungs/Pleura: Clearing of previously noted subpleural consolidations. Mild atelectasis and or scarring at the bases with trace pleural thickening. No significant effusion. Apical scarring. 3 mm left apical nodule versus nodular scarring series 10, image 19. Musculoskeletal: Sternum appears  intact. No acute osseous abnormality Review of the MIP images confirms the above findings. CT ABDOMEN and PELVIS FINDINGS Hepatobiliary: No focal liver abnormality is seen. No gallstones, gallbladder wall thickening, or biliary dilatation. Pancreas: Unremarkable. No pancreatic ductal dilatation or surrounding inflammatory changes. Spleen: Normal in size without focal abnormality. Adrenals/Urinary Tract: Adrenal glands are normal. Mild to moderate right-sided hydronephrosis to the UPJ, similar compared to prior. No left hydronephrosis. Renal cysts for which no imaging follow-up is recommended. Decompressed urinary bladder contains Foley catheter Stomach/Bowel: Stomach within normal limits. No dilated small bowel. No acute bowel wall thickening. Vascular/Lymphatic: Aortic atherosclerosis. No enlarged abdominal or pelvic lymph nodes. Reproductive: Uterus and bilateral adnexa are unremarkable. Other: Negative for ascites or free air Musculoskeletal: No acute or suspicious osseous abnormality. Review of the MIP images confirms the above findings. IMPRESSION: 1. Negative for acute pulmonary embolus. 2. Clearing of previously noted  bilateral lower lobe subpleural consolidations. Mild atelectasis and or scarring at the bases with trace pleural thickening. 3. No CT evidence for acute intra-abdominal or pelvic abnormality. 4. Mild to moderate right-sided hydronephrosis to the UPJ, similar compared to recent prior exams, new/increased compared with 2023. 5. Aortic atherosclerosis. Aortic Atherosclerosis (ICD10-I70.0). Electronically Signed   By: Luke Bun M.D.   On: 10/13/2024 23:36   CT Head Wo Contrast Result Date: 10/13/2024 EXAM: CT HEAD WITHOUT CONTRAST 10/13/2024 11:20:53 PM TECHNIQUE: CT of the head was performed without the administration of intravenous contrast. Automated exposure control, iterative reconstruction, and/or weight based adjustment of the mA/kV was utilized to reduce the radiation dose to as low as reasonably achievable. COMPARISON: 09/29/2024 CLINICAL HISTORY: Mental status change, unknown cause. FINDINGS: BRAIN AND VENTRICLES: No acute hemorrhage. No evidence of acute infarct. No hydrocephalus. No extra-axial collection. No mass effect or midline shift. There is atrophy and chronic small vessel disease throughout the deep white matter. Old right thalamic lacunar infarct. ORBITS: No acute abnormality. SINUSES: No acute abnormality. SOFT TISSUES AND SKULL: No acute soft tissue abnormality. No skull fracture. IMPRESSION: 1. No acute intracranial abnormality. 2. Old right thalamic lacunar infarct. 3. Atrophy and chronic small vessel disease throughout the deep white matter. Electronically signed by: Franky Crease MD 10/13/2024 11:25 PM EST RP Workstation: [id number]   DG Chest Port 1 View Result Date: 10/13/2024 EXAM: 1 VIEW(S) XRAY OF THE CHEST 10/13/2024 09:15:00 PM COMPARISON: 10/07/2024 CLINICAL HISTORY: Questionable sepsis - evaluate for abnormality FINDINGS: LUNGS AND PLEURA: Hyperinflation compatible with emphysema. No focal pulmonary opacity. No pulmonary edema. No pleural effusion. No pneumothorax. HEART AND  MEDIASTINUM: Aortic atherosclerosis. No acute abnormality of the cardiac and mediastinal silhouettes. BONES AND SOFT TISSUES: No acute osseous abnormality. IMPRESSION: 1. No acute cardiopulmonary abnormality. 2. Hyperinflation compatible with emphysema. 3. Aortic atherosclerosis. Electronically signed by: Franky Crease MD 10/13/2024 09:27 PM EST RP Workstation: [id number]    EKG: I independently viewed the EKG done and my findings are as followed: Sinus tachycardia rate of 108.  QTc 445.  Assessment/Plan Present on Admission:  AMS (altered mental status)  Principal Problem:   AMS (altered mental status)  Acute metabolic encephalopathy, possibly secondary to underlying infection UA and chest x-ray were unrevealing Follow peripheral blood cultures x 2 Follow MRSA screening test, add MRSA coverage if positive. Continue IV Zosyn until infection can be ruled out  SIRS Leukocytosis 18.5 K, neutrophilia, bandemia, tachypnea 25 Follow peripheral blood cultures Continue Zosyn Add IV vancomycin  if MRSA positive  History of chronic right UPJ obstruction On nitrofurantoin prior to admission Consult urology in  the morning  Hypothyroidism Resume home levothyroxine   Chronic anxiety Resume home regimen  Urinary retention, Foley catheter in place Monitor urine output  Generalized weakness PT OT assessment Fall precautions   Time: 75 minutes.   DVT prophylaxis: Subcu Lovenox  daily.  Code Status: Full code, no family members at bedside, unable to reach the son.  Family Communication: No family members at bedside.  Disposition Plan: Admitted to telemetry unit.  Consults called: None.  Admission status: Observation status.   Status is: Observation    Terry LOISE Hurst MD [facility] Pager [phone]  If 7PM-7AM, please contact night-coverage [email or web address] Password [email or web address]  10/14/2024, 3:34 AM

## 2024-10-14 NOTE — Inpatient Diabetes Management (Signed)
 Inpatient Diabetes Program Recommendations  AACE/ADA: New Consensus Statement on Inpatient Glycemic Control (2015)  Target Ranges:  Prepandial:   less than 140 mg/dL      Peak postprandial:   less than 180 mg/dL (1-2 hours)      Critically ill patients:  140 - 180 mg/dL   Lab Results  Component Value Date   GLUCAP 172 (H) 10/11/2024   HGBA1C 9.7 (H) 08/16/2024    Latest Reference Range & Units 10/13/24 21:36 10/14/24 04:08  Glucose 70 - 99 mg/dL 719 (H) 631 (H)  (H): Data is abnormally high  Diabetes history: DM2 Outpatient Diabetes medications: Tresiba  26-32 units at bedtime, Humalog  8-15 units TID with meals Current orders for Inpatient glycemic control: None  Inpatient Diabetes Program Recommendations:   Please consider: -Semglee  20 units now and daily,  -Novolog  0-15 units TID with meals,Novolog  0-5 units at bedtime -Novolog  3 units TID with meals if patient eats at least 50% of meals.   Thank you, Cimone Fahey E. Tulsi Crossett, RN, MSN, CNS, CDCES  Diabetes Coordinator Inpatient Glycemic Control Team Team Pager [phone] (8am-5pm) 10/14/2024 9:53 AM

## 2024-10-14 NOTE — Plan of Care (Signed)

## 2024-10-14 NOTE — TOC Initial Note (Signed)
 Transition of Care [facility]) - Initial/Assessment Note    Patient Details  Name: Gabrielle Wood MRN: [id number] Date of Birth: Oct 04, 1946  Transition of Care [facility]) CM/SW Contact:    Lucie Lunger, LCSWA Phone Number: 10/14/2024, 12:41 PM  Clinical Narrative:                 Pt is high risk for admission. CSW notes pt with very recent admission to hospital. Pt is from home and has [facility] PT/OT/RN services through [facility]. PT is recommending continuing HH services at home. TOC to follow.   Expected Discharge Plan: Home w Home Health Services Barriers to Discharge: Continued Medical Work up   Patient Goals and CMS Choice Patient states their goals for this hospitalization and ongoing recovery are:: return home CMS Medicare.gov Compare Post Acute Care list provided to:: Patient Choice offered to / list presented to : Patient, Adult Children      Expected Discharge Plan and Services In-house Referral: Clinical Social Work Discharge Planning Services: CM Consult Post Acute Care Choice: Home Health, Resumption of Svcs/PTA Provider Living arrangements for the past 2 months: Single Family Home                           HH Arranged: RN, PT, OT [facility] Agency: [facility] Date [facility] Agency Contacted: 10/14/24      Prior Living Arrangements/Services Living arrangements for the past 2 months: Single Family Home Lives with:: Adult Children Patient language and need for interpreter reviewed:: Yes Do you feel safe going back to the place where you live?: Yes      Need for Family Participation in Patient Care: Yes (Comment) Care giver support system in place?: Yes (comment) Current home services: DME, Home PT Criminal Activity/Legal Involvement Pertinent to Current Situation/Hospitalization: No - Comment as needed  Activities of Daily Living   ADL Screening (condition at time of admission) Independently performs ADLs?: No Does the patient have a NEW difficulty with  bathing/dressing/toileting/self-feeding that is expected to last >3 days?: Yes (Initiates electronic notice to provider for possible OT consult) Does the patient have a NEW difficulty with getting in/out of bed, walking, or climbing stairs that is expected to last >3 days?: Yes (Initiates electronic notice to provider for possible PT consult) Does the patient have a NEW difficulty with communication that is expected to last >3 days?: Yes (Initiates electronic notice to provider for possible SLP consult) Is the patient deaf or have difficulty hearing?: No Does the patient have difficulty seeing, even when wearing glasses/contacts?: No Does the patient have difficulty concentrating, remembering, or making decisions?: Yes  Permission Sought/Granted                  Emotional Assessment Appearance:: Appears stated age Attitude/Demeanor/Rapport: Engaged Affect (typically observed): Accepting   Alcohol  / Substance Use: Not Applicable Psych Involvement: No (comment)  Admission diagnosis:  AMS (altered mental status) [R41.82] Patient Active Problem List   Diagnosis Date Noted   AMS (altered mental status) 10/13/2024   Uncontrolled type 2 diabetes mellitus with hyperglycemia (HCC) 10/07/2024   Leukocytosis 10/07/2024   Lactic acidosis 10/07/2024   Normocytic anemia 10/07/2024   Failure to thrive in adult 10/07/2024   UPJ obstruction, 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   AKI (acute kidney injury) 09/29/2024   Sepsis (HCC) 08/15/2024   SIRS (systemic inflammatory response syndrome) (HCC) 07/21/2024   UTI (urinary tract infection) 07/20/2024   Muscle wasting and atrophy, not elsewhere classified,  left lower leg 06/29/2024   Other abnormalities of gait and mobility 06/29/2024   Constipation 06/26/2024   Muscle spasm 06/26/2024   OAB (overactive bladder) 06/26/2024   Other toxic encephalopathy 06/26/2024   Oxygen  dependent 06/26/2024   Status post coronary artery stent placement 06/26/2024   Type 2  diabetes mellitus with diabetic neuropathy (HCC) 06/26/2024   Chronic hypoxic respiratory failure (HCC) 06/23/2024   COPD exacerbation (HCC) 05/27/2024   Anxiety 05/28/2023   Vitamin D deficiency 01/31/2022   Age-related physical debility 11/17/2021   Pressure injury of skin 10/16/2021   DKA (diabetic ketoacidosis) (HCC) 10/14/2021   Hypomagnesemia 10/14/2021   Hypoalbuminemia 10/14/2021   CAP (community acquired pneumonia) 10/14/2021   Renal cyst 10/02/2021   History of colonic polyps 08/27/2018   Taking multiple medications for chronic disease 08/27/2018   Type 2 diabetes mellitus with hypoglycemia without coma, with long-term current use of insulin  (HCC) 09/14/2015   Tobacco abuse counseling 09/14/2015   Rheumatoid arthritis (HCC) 06/08/2015   Diabetes mellitus (HCC) 06/08/2015   Gastritis and duodenitis 04/05/2012   Pancreatitis, acute 04/03/2012   Mixed hyperlipidemia 04/03/2012   Benign hypertension 04/03/2012   Hypothyroidism 04/03/2012   GERD (gastroesophageal reflux disease) 04/03/2012   PCP:  [name], MD Pharmacy:   [facility], [place] - [address]  [place] [address] Phone: [phone] Fax: [phone]     Social Drivers of Health (SDOH) Social History: SDOH Screenings   Food Insecurity: No Food Insecurity (10/14/2024)  Housing: Low Risk  (10/14/2024)  Transportation Needs: No Transportation Needs (10/14/2024)  Utilities: Not At Risk (10/14/2024)  Depression (PHQ2-9): High Risk (11/01/2021)  Financial Resource Strain: Low Risk (08/11/2024)   Received from [facility] Care  Physical Activity: Inactive (08/11/2024)   Received from [facility]  Social Connections: Unknown (10/14/2024)  Recent Concern: Social Connections - Socially Isolated (10/07/2024)  Stress: No Stress Concern Present (08/11/2024)   Received from [facility]  Tobacco Use: Medium Risk (10/14/2024)  Health Literacy: Medium Risk (08/11/2024)   Received  from [facility]   SDOH Interventions:     Readmission Risk Interventions    10/14/2024   12:40 PM 10/11/2024   10:33 AM 10/10/2024    9:49 AM  Readmission Risk Prevention Plan  Transportation Screening Complete Complete Complete  Home Care Screening  Complete   Medication Review (RN CM)  Complete   HRI or Home Care Consult Complete  Complete  Social Work Consult for Recovery Care Planning/Counseling Complete  Complete  Palliative Care Screening Not Applicable  Not Applicable  Medication Review [employer]) Complete  Complete

## 2024-10-14 NOTE — TOC CM/SW Note (Signed)
 Transition of Care [facility]) - Inpatient Brief Assessment   Patient Details  Name: Gabrielle Wood MRN: [id number] Date of Birth: 31-May-1946  Transition of Care [facility]) CM/SW Contact:    Lucie Lunger, LCSWA Phone Number: 10/14/2024, 8:52 AM   Clinical Narrative: Transition of Care Department [facility]) has reviewed patient and no TOC needs have been identified at this time. We will continue to monitor patient advancement through interdiciplinary progression rounds. If new patient transition needs arise, please place a TOC consult.  Transition of Care Asessment: Insurance and Status: Insurance coverage has been reviewed Patient has primary care physician: Yes Home environment has been reviewed: From home Prior level of function:: Independent, family nearby Prior/Current Home Services: Current home services ([facility] PT/OT/RN) Social Drivers of Health Review: SDOH reviewed no interventions necessary Readmission risk has been reviewed: Yes Transition of care needs: no transition of care needs at this time

## 2024-10-15 DIAGNOSIS — N139 Obstructive and reflux uropathy, unspecified: Secondary | ICD-10-CM | POA: Diagnosis not present

## 2024-10-15 DIAGNOSIS — G9341 Metabolic encephalopathy: Secondary | ICD-10-CM | POA: Diagnosis not present

## 2024-10-15 DIAGNOSIS — R6510 Systemic inflammatory response syndrome (SIRS) of non-infectious origin without acute organ dysfunction: Secondary | ICD-10-CM

## 2024-10-15 LAB — BASIC METABOLIC PANEL WITH GFR
Anion gap: 12 (ref 5–15)
BUN: 14 mg/dL (ref 8–23)
CO2: 23 mmol/L (ref 22–32)
Calcium: 9.2 mg/dL (ref 8.9–10.3)
Chloride: 102 mmol/L (ref 98–111)
Creatinine, Ser: 0.88 mg/dL (ref 0.44–1.00)
GFR, Estimated: >60 mL/min (ref >60)
Glucose, Bld: 80 mg/dL (ref 70–99)
Potassium: 3.3 mmol/L — ABNORMAL LOW (ref 3.5–5.1)
Sodium: 137 mmol/L (ref 135–145)

## 2024-10-15 LAB — GLUCOSE, CAPILLARY
Glucose-Capillary: 126 mg/dL — ABNORMAL HIGH (ref 70–99)
Glucose-Capillary: 267 mg/dL — ABNORMAL HIGH (ref 70–99)

## 2024-10-15 LAB — CBC
HCT: 29.9 % — ABNORMAL LOW (ref 36.0–46.0)
Hemoglobin: 9.7 g/dL — ABNORMAL LOW (ref 12.0–15.0)
MCH: 27 pg (ref 26.0–34.0)
MCHC: 32.4 g/dL (ref 30.0–36.0)
MCV: 83.3 fL (ref 80.0–100.0)
Platelets: 275 K/uL (ref 150–400)
RBC: 3.59 MIL/uL — ABNORMAL LOW (ref 3.87–5.11)
RDW: 15.6 % — ABNORMAL HIGH (ref 11.5–15.5)
WBC: 9.3 K/uL (ref 4.0–10.5)
nRBC: 0 % (ref 0.0–0.2)

## 2024-10-15 LAB — MRSA NEXT GEN BY PCR, NASAL: MRSA by PCR Next Gen: NOT DETECTED

## 2024-10-15 LAB — HEMOGLOBIN A1C
Hgb A1c MFr Bld: 10.5 % — ABNORMAL HIGH (ref 4.8–5.6)
Mean Plasma Glucose: 254.65 mg/dL

## 2024-10-15 LAB — MAGNESIUM: Magnesium: 1.8 mg/dL (ref 1.7–2.4)

## 2024-10-15 MED ORDER — SULFAMETHOXAZOLE-TRIMETHOPRIM 800-160 MG PO TABS: 1.0000 | ORAL_TABLET | Freq: Two times a day (BID) | ORAL | 0 refills | Status: DC

## 2024-10-15 MED ORDER — POTASSIUM CHLORIDE 20 MEQ PO PACK
40.0000 meq | PACK | Freq: Two times a day (BID) | ORAL | Status: DC
  Administered 2024-10-15: 40 meq via ORAL
  Filled 2024-10-15: qty 2

## 2024-10-15 MED ORDER — QUETIAPINE FUMARATE 50 MG PO TABS: 50.0000 mg | ORAL_TABLET | Freq: Two times a day (BID) | ORAL | 0 refills | Status: DC | PRN

## 2024-10-15 NOTE — Plan of Care (Signed)

## 2024-10-15 NOTE — Discharge Summary (Signed)
 Physician Discharge Summary   Patient: Gabrielle Wood MRN: [id number] DOB: March 17, 1946  Admit date:     10/13/2024  Discharge date: 10/15/24  Discharge Physician: Carliss LELON Canales   PCP: [name], MD   Recommendations at discharge:    Pt to be discharged home with home health.   If you experience worsening fever, chills, chest pain, shortness of breath, or other concerning symptoms, please call your PCP or go to the emergency department immediately.  Discharge Diagnoses: Principal Problem:   AMS (altered mental status)  Resolved Problems:   * No resolved hospital problems. *   Hospital Course:  78 y.o. female with medical history significant for hypertension, type 2 diabetes, hyperlipidemia, coronary artery disease status post PCI with stenting, hypothyroidism, GERD, who presents to the ER from home due to altered mental status.  Recently admitted from 11/5 through 10/11/2024 due to dehydration with AKI, failure to thrive, chronic right UPJ obstruction, and acute urinary retention with Foley catheter placed.  The patient had been doing fine up until today.  She went to her urologist and after she returned home, about 6 hours later, she was noted to be confused and agitated.  She was brought to the ER for further evaluation.   In the ER, alert confused and unable to provide a history.  Called her son to obtain a history, with no answer.  Noncontrast head CT was unrevealing.   Labs were notable for leukocytosis and bands.  UA negative for pyuria.  Chest x-ray was nonacute.  Due to concern for underlying infection, peripheral blood cultures x 2 were obtained, IV antibiotics were initiated.  Admitted by [facility], hospitalist service.  Assessment and Plan:  Acute metabolic encephalopathy - Lethargic and altered on presentation.  Multifactorial etiology given age, delirium, possible underlying infection, dehydration.  Showed remarkable recovery and resolution throughout the course of  hospital stay.  Alert, talking, eager for discharge home.  Discussion with son about possible underlying sundowning.  Recommend to alleviate any modifiable metabolic reasons such as dehydration, medications (Xanax ).  Agreed to trial Seroquel at bedtime as needed.  SIRS with concern for sepsis - Leukocytosis, encephalopathy on presentation but no obvious source of infection.  Chest x-ray clear, UA clear, CT abdomen pelvis negative.  Leukocytosis resolved back to normal.  Afebrile, encephalopathy resolved.  Sepsis ruled out.  Will discharge with empiric Bactrim for 5 days to take as directed.  Volume depletion/dehydration - Likely contributing.  Patient's son reporting that patient had only had half of bottle of water  to drink the entire day and had minimal intake previous.  Showing marked improvement after IV fluid hydration.  Encourage continued p.o. intake.  Chronic right UPJ obstruction - Imaging shows similar to previous.  Follows closely with urology in the outpatient setting.  Can resume chronic daily nitrofurantoin after completing Bactrim.  Follow-up with urology at normal scheduled visit.  Urinary retention (POA)-Foley in place - Patient be discharged with Foley.  Following up with urology at normal scheduled visit.  Physical debilitation muscle weakness - Evaluated by PT/OT.  Recommendations for home health.    Consultants: None Procedures performed: None Disposition: Home health Diet recommendation:  Discharge Diet Orders (From admission, onward)     Start     Ordered   10/15/24 0000  Diet - low sodium heart healthy        10/15/24 1131           Cardiac and Carb modified diet  DISCHARGE MEDICATION: Allergies as  of 10/15/2024       Reactions   Amlodipine  Other (See Comments)   Unknown    Ciprocin-fluocin-procin [fluocinolone] Other (See Comments)   Nightmares   Lipitor [atorvastatin ] Other (See Comments)   Muscle aches, pain   Nebivolol Other (See Comments)    Unknown    Nitrofuran Derivatives Other (See Comments)   Unknown    Lisinopril Cough   Red Yeast Rice [cholestin] Itching, Rash        Medication List     STOP taking these medications    valsartan 40 MG tablet Commonly known as: DIOVAN       TAKE these medications    acetaminophen  500 MG tablet Commonly known as: TYLENOL  Take 1,000 mg by mouth in the morning and at bedtime.   albuterol  108 (90 Base) MCG/ACT inhaler Commonly known as: VENTOLIN  HFA Inhale 1-2 puffs into the lungs every 4 (four) hours as needed for wheezing or shortness of breath.   ALPRAZolam  1 MG tablet Commonly known as: XANAX  Take 0.5 tablets (0.5 mg total) by mouth at bedtime. 0.5 tablet in am and 0.5 tab at night What changed: additional instructions   aspirin  EC 81 MG tablet Take 1 tablet (81 mg total) by mouth daily. Swallow whole.   atorvastatin  80 MG tablet Commonly known as: LIPITOR Take 80 mg by mouth daily.   baclofen 10 MG tablet Commonly known as: LIORESAL Take 10 mg by mouth 2 (two) times daily as needed for muscle spasms.   Brilinta  60 MG Tabs tablet Generic drug: ticagrelor  Take 60 mg by mouth 2 (two) times daily.   FreeStyle Libre 3 Plus Sensor Misc Change sensor every 15 days.   gabapentin  100 MG capsule Commonly known as: NEURONTIN  Take 1 capsule (100 mg total) by mouth 3 (three) times daily.   insulin  lispro 100 UNIT/ML KwikPen Commonly known as: HumaLOG  KwikPen Inject 8-14 Units into the skin 3 (three) times daily. What changed: how much to take   levothyroxine  175 MCG tablet Commonly known as: SYNTHROID  Take 175 mcg by mouth daily.   magnesium  oxide 400 (240 Mg) MG tablet Commonly known as: MAG-OX Take 400 mg by mouth daily.   megestrol 400 MG/10ML suspension Commonly known as: MEGACE Take 10 mLs (400 mg total) by mouth daily.   metoprolol  succinate 25 MG 24 hr tablet Commonly known as: TOPROL -XL Take 25 mg by mouth daily.   nitrofurantoin 100 MG  capsule Commonly known as: MACRODANTIN Take 100 mg by mouth daily.   nitroGLYCERIN 0.4 MG SL tablet Commonly known as: NITROSTAT Place 0.4 mg under the tongue every 5 (five) minutes as needed for chest pain.   OXYGEN  Inhale 2 L/min into the lungs at bedtime.   pantoprazole  40 MG tablet Commonly known as: PROTONIX  Take 40 mg by mouth daily.   QUEtiapine 50 MG tablet Commonly known as: SEROquel Take 1 tablet (50 mg total) by mouth 3 times/day as needed-between meals & bedtime (delerium/sundowning).   senna-docusate 8.6-50 MG tablet Commonly known as: Senokot-S Take 1 tablet by mouth at bedtime as needed for mild constipation.   sulfamethoxazole-trimethoprim 800-160 MG tablet Commonly known as: Bactrim DS Take 1 tablet by mouth 2 (two) times daily.   Trelegy Ellipta 100-62.5-25 MCG/ACT Aepb Generic drug: Fluticasone-Umeclidin-Vilant Inhale 1 puff into the lungs daily.   Tresiba  FlexTouch 100 UNIT/ML FlexTouch Pen Generic drug: insulin  degludec Inject 26 Units into the skin at bedtime.         Discharge Exam: [employer]  10/13/24 2041  Weight: 66.7 kg    GENERAL:  Alert, pleasant, no acute distress, frail HEENT:  EOMI CARDIOVASCULAR:  RRR, no murmurs appreciated RESPIRATORY:  Clear to auscultation, no wheezing, rales, or rhonchi GASTROINTESTINAL:  Soft, nontender, nondistended EXTREMITIES:  No LE edema bilaterally NEURO:  No new focal deficits appreciated SKIN:  No rashes noted PSYCH:  Appropriate mood and affect     Condition at discharge: improving  The results of significant diagnostics from this hospitalization (including imaging, microbiology, ancillary and laboratory) are listed below for reference.   Imaging Studies: CT Angio Chest PE W/Cm &/Or Wo Cm Result Date: 10/13/2024 CLINICAL DATA:  Sepsis EXAM: CT ANGIOGRAPHY CHEST CT ABDOMEN AND PELVIS WITH CONTRAST TECHNIQUE: Multidetector CT imaging of the chest was performed using the standard  protocol during bolus administration of intravenous contrast. Multiplanar CT image reconstructions and MIPs were obtained to evaluate the vascular anatomy. Multidetector CT imaging of the abdomen and pelvis was performed using the standard protocol during bolus administration of intravenous contrast. RADIATION DOSE REDUCTION: This exam was performed according to the departmental dose-optimization program which includes automated exposure control, adjustment of the mA and/or kV according to patient size and/or use of iterative reconstruction technique. CONTRAST:  OMNIPAQUE  IOHEXOL  350 MG/ML SOLN COMPARISON:  CT 09/29/2024, 08/15/2024, 07/05/2022 FINDINGS: CTA CHEST FINDINGS Cardiovascular: Satisfactory opacification of the pulmonary arteries to the segmental level. No evidence of pulmonary embolism. Moderate aortic atherosclerosis. No aneurysm or dissection. Multi vessel coronary vascular calcification. Normal cardiac size. No pericardial effusion. Mediastinum/Nodes: Patent trachea. No thyroid  mass. Borderline AP window lymph node measuring 11 mm. Small right hilar nodes measuring up to 13 mm. Esophagus within normal limits. Lungs/Pleura: Clearing of previously noted subpleural consolidations. Mild atelectasis and or scarring at the bases with trace pleural thickening. No significant effusion. Apical scarring. 3 mm left apical nodule versus nodular scarring series 10, image 19. Musculoskeletal: Sternum appears intact. No acute osseous abnormality Review of the MIP images confirms the above findings. CT ABDOMEN and PELVIS FINDINGS Hepatobiliary: No focal liver abnormality is seen. No gallstones, gallbladder wall thickening, or biliary dilatation. Pancreas: Unremarkable. No pancreatic ductal dilatation or surrounding inflammatory changes. Spleen: Normal in size without focal abnormality. Adrenals/Urinary Tract: Adrenal glands are normal. Mild to moderate right-sided hydronephrosis to the UPJ, similar compared to  prior. No left hydronephrosis. Renal cysts for which no imaging follow-up is recommended. Decompressed urinary bladder contains Foley catheter Stomach/Bowel: Stomach within normal limits. No dilated small bowel. No acute bowel wall thickening. Vascular/Lymphatic: Aortic atherosclerosis. No enlarged abdominal or pelvic lymph nodes. Reproductive: Uterus and bilateral adnexa are unremarkable. Other: Negative for ascites or free air Musculoskeletal: No acute or suspicious osseous abnormality. Review of the MIP images confirms the above findings. IMPRESSION: 1. Negative for acute pulmonary embolus. 2. Clearing of previously noted bilateral lower lobe subpleural consolidations. Mild atelectasis and or scarring at the bases with trace pleural thickening. 3. No CT evidence for acute intra-abdominal or pelvic abnormality. 4. Mild to moderate right-sided hydronephrosis to the UPJ, similar compared to recent prior exams, new/increased compared with 2023. 5. Aortic atherosclerosis. Aortic Atherosclerosis (ICD10-I70.0). Electronically Signed   By: Luke Bun M.D.   On: 10/13/2024 23:36   CT ABDOMEN PELVIS W CONTRAST Result Date: 10/13/2024 CLINICAL DATA:  Sepsis EXAM: CT ANGIOGRAPHY CHEST CT ABDOMEN AND PELVIS WITH CONTRAST TECHNIQUE: Multidetector CT imaging of the chest was performed using the standard protocol during bolus administration of intravenous contrast. Multiplanar CT image reconstructions and MIPs were obtained to evaluate  the vascular anatomy. Multidetector CT imaging of the abdomen and pelvis was performed using the standard protocol during bolus administration of intravenous contrast. RADIATION DOSE REDUCTION: This exam was performed according to the departmental dose-optimization program which includes automated exposure control, adjustment of the mA and/or kV according to patient size and/or use of iterative reconstruction technique. CONTRAST:  OMNIPAQUE  IOHEXOL  350 MG/ML SOLN COMPARISON:  CT  09/29/2024, 08/15/2024, 07/05/2022 FINDINGS: CTA CHEST FINDINGS Cardiovascular: Satisfactory opacification of the pulmonary arteries to the segmental level. No evidence of pulmonary embolism. Moderate aortic atherosclerosis. No aneurysm or dissection. Multi vessel coronary vascular calcification. Normal cardiac size. No pericardial effusion. Mediastinum/Nodes: Patent trachea. No thyroid  mass. Borderline AP window lymph node measuring 11 mm. Small right hilar nodes measuring up to 13 mm. Esophagus within normal limits. Lungs/Pleura: Clearing of previously noted subpleural consolidations. Mild atelectasis and or scarring at the bases with trace pleural thickening. No significant effusion. Apical scarring. 3 mm left apical nodule versus nodular scarring series 10, image 19. Musculoskeletal: Sternum appears intact. No acute osseous abnormality Review of the MIP images confirms the above findings. CT ABDOMEN and PELVIS FINDINGS Hepatobiliary: No focal liver abnormality is seen. No gallstones, gallbladder wall thickening, or biliary dilatation. Pancreas: Unremarkable. No pancreatic ductal dilatation or surrounding inflammatory changes. Spleen: Normal in size without focal abnormality. Adrenals/Urinary Tract: Adrenal glands are normal. Mild to moderate right-sided hydronephrosis to the UPJ, similar compared to prior. No left hydronephrosis. Renal cysts for which no imaging follow-up is recommended. Decompressed urinary bladder contains Foley catheter Stomach/Bowel: Stomach within normal limits. No dilated small bowel. No acute bowel wall thickening. Vascular/Lymphatic: Aortic atherosclerosis. No enlarged abdominal or pelvic lymph nodes. Reproductive: Uterus and bilateral adnexa are unremarkable. Other: Negative for ascites or free air Musculoskeletal: No acute or suspicious osseous abnormality. Review of the MIP images confirms the above findings. IMPRESSION: 1. Negative for acute pulmonary embolus. 2. Clearing of  previously noted bilateral lower lobe subpleural consolidations. Mild atelectasis and or scarring at the bases with trace pleural thickening. 3. No CT evidence for acute intra-abdominal or pelvic abnormality. 4. Mild to moderate right-sided hydronephrosis to the UPJ, similar compared to recent prior exams, new/increased compared with 2023. 5. Aortic atherosclerosis. Aortic Atherosclerosis (ICD10-I70.0). Electronically Signed   By: Luke Bun M.D.   On: 10/13/2024 23:36   CT Head Wo Contrast Result Date: 10/13/2024 EXAM: CT HEAD WITHOUT CONTRAST 10/13/2024 11:20:53 PM TECHNIQUE: CT of the head was performed without the administration of intravenous contrast. Automated exposure control, iterative reconstruction, and/or weight based adjustment of the mA/kV was utilized to reduce the radiation dose to as low as reasonably achievable. COMPARISON: 09/29/2024 CLINICAL HISTORY: Mental status change, unknown cause. FINDINGS: BRAIN AND VENTRICLES: No acute hemorrhage. No evidence of acute infarct. No hydrocephalus. No extra-axial collection. No mass effect or midline shift. There is atrophy and chronic small vessel disease throughout the deep white matter. Old right thalamic lacunar infarct. ORBITS: No acute abnormality. SINUSES: No acute abnormality. SOFT TISSUES AND SKULL: No acute soft tissue abnormality. No skull fracture. IMPRESSION: 1. No acute intracranial abnormality. 2. Old right thalamic lacunar infarct. 3. Atrophy and chronic small vessel disease throughout the deep white matter. Electronically signed by: Franky Crease MD 10/13/2024 11:25 PM EST RP Workstation: [id number]   DG Chest Port 1 View Result Date: 10/13/2024 EXAM: 1 VIEW(S) XRAY OF THE CHEST 10/13/2024 09:15:00 PM COMPARISON: 10/07/2024 CLINICAL HISTORY: Questionable sepsis - evaluate for abnormality FINDINGS: LUNGS AND PLEURA: Hyperinflation compatible with emphysema. No focal pulmonary opacity.  No pulmonary edema. No pleural effusion. No  pneumothorax. HEART AND MEDIASTINUM: Aortic atherosclerosis. No acute abnormality of the cardiac and mediastinal silhouettes. BONES AND SOFT TISSUES: No acute osseous abnormality. IMPRESSION: 1. No acute cardiopulmonary abnormality. 2. Hyperinflation compatible with emphysema. 3. Aortic atherosclerosis. Electronically signed by: Franky Crease MD 10/13/2024 09:27 PM EST RP Workstation: [id number]   DG Chest 2 View Result Date: 10/07/2024 EXAM: 2 VIEW(S) XRAY OF THE CHEST 10/07/2024 11:12:00 AM COMPARISON: Prior studies are available. CLINICAL HISTORY: Weakness. FINDINGS: LUNGS AND PLEURA: Minimal left basal scarring or subsegmental atelectasis is noted. Small pleural effusion. No pulmonary edema. No pneumothorax. HEART AND MEDIASTINUM: No acute abnormality of the cardiac and mediastinal silhouettes. BONES AND SOFT TISSUES: No acute osseous abnormality. IMPRESSION: 1. Minimal left basal scarring or subsegmental atelectasis. 2. Small left pleural effusion. Electronically signed by: Lynwood Seip MD 10/07/2024 11:50 AM EST RP Workstation: [id number]   CT CHEST ABDOMEN PELVIS W CONTRAST Result Date: 09/29/2024 CLINICAL DATA:  Left-sided chest pain right lower quadrant abdominal pain EXAM: CT CHEST, ABDOMEN, AND PELVIS WITH CONTRAST TECHNIQUE: Multidetector CT imaging of the chest, abdomen and pelvis was performed following the standard protocol during bolus administration of intravenous contrast. RADIATION DOSE REDUCTION: This exam was performed according to the departmental dose-optimization program which includes automated exposure control, adjustment of the mA and/or kV according to patient size and/or use of iterative reconstruction technique. CONTRAST:  80mL OMNIPAQUE  IOHEXOL  300 MG/ML  SOLN COMPARISON:  CT 08/15/2024, 01/06/2024, CT 11/04/2022, 07/05/2022, 10/14/2021 FINDINGS: CT CHEST FINDINGS Cardiovascular: Moderate aortic atherosclerosis. No aneurysm. Multi vessel coronary vascular calcification. Normal  cardiac size. No pericardial effusion Mediastinum/Nodes: Patent trachea. No thyroid  mass. Borderline mediastinal lymph nodes as before, AP window nodes up to 11 mm. Small hiatal hernia Lungs/Pleura: Mild diffuse septal thickening. Apical scarring. Bronchial wall thickening and persistent subpleural left greater than right consolidation as compared with CT from last month. Atelectasis or small focus of consolidation at the lingula, slightly progressive. Trace pleural effusions. Musculoskeletal: Sternum appears intact. No acute or suspicious osseous abnormality. CT ABDOMEN PELVIS FINDINGS Hepatobiliary: No focal liver abnormality is seen. No gallstones, gallbladder wall thickening, or biliary dilatation. Pancreas: Unremarkable. No pancreatic ductal dilatation or surrounding inflammatory changes. Spleen: Normal in size without focal abnormality. Adrenals/Urinary Tract: Adrenal glands are normal. Renal cysts for which no imaging follow-up is recommended. Moderate right-sided hydronephrosis to the level of the UPJ stable to slightly progressive compared with most recent prior CT. Possible mild urothelial thickening of proximal right ureter, series 2, image 91. Distended urinary bladder. Right ureter decompressed. Stomach/Bowel: Stomach is nondistended. No dilated small bowel. Negative appendix. Question mild wall thickening of ascending colon with slight mucosal enhancement, series 2, image 91 through 99. Not much surrounding stranding. Vascular/Lymphatic: Aortic atherosclerosis. No enlarged abdominal or pelvic lymph nodes. Reproductive: Uterus and bilateral adnexa are unremarkable. Calcified uterine fibroid Other: Negative for pelvic effusion or free air Musculoskeletal: No acute or suspicious osseous abnormality. IMPRESSION: 1. Bronchial wall thickening and persistent subpleural left greater than right consolidation and trace pleural effusions as compared with CT from last month. Atelectasis or small focus of  consolidation at the lingula, slightly progressive. Findings could be secondary to infection or potential aspiration. 2. Moderate right-sided hydronephrosis to the level of the UPJ, stable to slightly progressive compared with most recent prior CT. Possible mild urothelial thickening of proximal right ureter, question mild inflammatory or infectious process. UPJ obstruction/proximal ureteral stricture also considered. Recommend urology consultation if not already performed. 3. Possible mild wall  thickening of ascending colon with slight mucosal enhancement but no significant surrounding edema or inflammation. Correlate for colitis type symptoms. 4. Aortic atherosclerosis. Aortic Atherosclerosis (ICD10-I70.0). Electronically Signed   By: Luke Bun M.D.   On: 09/29/2024 15:33   CT Head Wo Contrast Result Date: 09/29/2024 CLINICAL DATA:  Headache EXAM: CT HEAD WITHOUT CONTRAST TECHNIQUE: Contiguous axial images were obtained from the base of the skull through the vertex without intravenous contrast. RADIATION DOSE REDUCTION: This exam was performed according to the departmental dose-optimization program which includes automated exposure control, adjustment of the mA and/or kV according to patient size and/or use of iterative reconstruction technique. COMPARISON:  CT brain 12/08/2021, 12/17/2023 FINDINGS: Brain: No acute territorial infarction, hemorrhage or intracranial mass.atrophy and mild chronic small vessel ischemic changes of the white matter. 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 Stable ventricle size. Vascular: No hyperdense vessels.  Carotid vascular calcification Skull: Normal. Negative for fracture or focal lesion. Sinuses/Orbits: No acute finding. Other: None IMPRESSION: 1. No CT evidence for acute intracranial abnormality. 2. Atrophy and chronic small vessel ischemic changes of the white matter. 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 Electronically Signed   By: Luke Bun M.D.    On: 09/29/2024 15:16   DG Chest Port 1 View Result Date: 09/29/2024 CLINICAL DATA:  Weakness EXAM: PORTABLE CHEST 1 VIEW COMPARISON:  Chest radiograph dated 08/14/2024 FINDINGS: Normal lung volumes. Patchy left basilar opacity. Unchanged blunting of the left costophrenic angle. No pneumothorax. The heart size and mediastinal contours are within normal limits. No acute osseous abnormality. IMPRESSION: 1. Patchy left basilar opacity, likely atelectasis. 2. Unchanged blunting of the left costophrenic angle, which may represent a small pleural effusion. Electronically Signed   By: Limin  Xu M.D.   On: 09/29/2024 13:13    Microbiology: Results for orders placed or performed during the hospital encounter of 10/13/24  Blood Culture (routine x 2)     Status: None (Preliminary result)   Collection Time: 10/13/24  9:36 PM   Specimen: BLOOD  Result Value Ref Range Status   Specimen Description BLOOD BLOOD RIGHT ARM  Final   Special Requests NONE  Final   Culture   Final    NO GROWTH 2 DAYS Performed at [facility], [address], [place], [place] [address]    Report Status PENDING  Incomplete  Blood Culture (routine x 2)     Status: None (Preliminary result)   Collection Time: 10/13/24  9:36 PM   Specimen: BLOOD  Result Value Ref Range Status   Specimen Description BLOOD BLOOD RIGHT ARM  Final   Special Requests NONE  Final   Culture   Final    NO GROWTH 2 DAYS Performed at [facility], [address], [place], [place] [address]    Report Status PENDING  Incomplete  MRSA Next Gen by PCR, Nasal     Status: None   Collection Time: 10/14/24  6:05 AM   Specimen: Nasal Mucosa; Nasal Swab  Result Value Ref Range Status   MRSA by PCR Next Gen NOT DETECTED NOT DETECTED Final    Comment: (NOTE) The GeneXpert MRSA Assay (FDA approved for NASAL specimens only), is one component of a comprehensive MRSA colonization surveillance program. It is not intended to diagnose MRSA infection nor to guide or  monitor treatment for MRSA infections. Test performance is not FDA approved in patients less than 52 years old. Performed at [facility], [address], [place], [place] [address]  Labs: CBC: Recent Labs  Lab 10/13/24 2136 10/14/24 0408 10/15/24 0400  WBC 18.5* 18.6* 9.3  NEUTROABS 16.1* 16.4*  --   HGB 10.9* 10.0* 9.7*  HCT 33.1* 31.1* 29.9*  MCV 82.8 83.6 83.3  PLT 305 290 275   Basic Metabolic Panel: Recent Labs  Lab 10/09/24 0352 10/13/24 2136 10/14/24 0408 10/15/24 0400  NA 136 131* 133* 137  K 4.0 3.8 3.6 3.3*  CL 102 96* 98 102  CO2 22 21* 22 23  GLUCOSE 253* 280* 368* 80  BUN 14 18 15 14   CREATININE 0.74 0.83 0.81 0.88  CALCIUM  9.7 9.9 9.2 9.2  MG 2.0  --  1.6* 1.8  PHOS  --   --  2.6  --    Liver Function Tests: Recent Labs  Lab 10/13/24 2136  AST 20  ALT 14  ALKPHOS 60  BILITOT 0.6  PROT 7.7  ALBUMIN 4.0   CBG: Recent Labs  Lab 10/11/24 1107 10/14/24 1547 10/14/24 1724 10/14/24 2039 10/15/24 0738  GLUCAP 172* 369* 386* 202* 126*    Discharge time spent: 35 minutes.  Length of inpatient stay: 1 days  Signed: Carliss LELON Canales, DO [facility] 10/15/2024

## 2024-10-15 NOTE — Evaluation (Signed)
 Occupational Therapy Evaluation Patient Details Name: Gabrielle Wood MRN: [id number] DOB: 10/14/1946 Today's Date: 10/15/2024   History of Present Illness   Gabrielle Wood is a 78 y.o. female with medical history significant for hypertension, type 2 diabetes, hyperlipidemia, coronary artery disease status post PCI with stenting, hypothyroidism, GERD, who presents to the ER from home due to altered mental status.  Recently admitted from 11/5 through 10/11/2024 due to dehydration with AKI, failure to thrive, chronic right UPJ obstruction, and acute urinary retention with Foley catheter placed.  The patient had been doing fine up until today.  She went to her urologist and after she returned home, about 6 hours later, she was noted to be confused and agitated.  She was brought to the ER for further evaluation. (per DO)     Clinical Impressions Pt agreeable to OT treatment. Pt required no physical assist for seated lower body dressing. CGA to supervision for ambulation to the toilet with RW. B UE generally weak with mild A/ROM deficits for shoulder flexion. Pt reports having family support most of the time. Pt left in the chair with call bell within reach. Pt is not recommended for any further acute OT services and will be discharged to care of nursing staff for remaining length of stay.       If plan is discharge home, recommend the following:   A little help with walking and/or transfers;A little help with bathing/dressing/bathroom;Assistance with cooking/housework;Assist for transportation;Help with stairs or ramp for entrance     Functional Status Assessment   Patient has had a recent decline in their functional status and demonstrates the ability to make significant improvements in function in a reasonable and predictable amount of time.     Equipment Recommendations   None recommended by OT             Precautions/Restrictions   Precautions Precautions: Fall Recall  of Precautions/Restrictions: Intact Restrictions Weight Bearing Restrictions Per Provider Order: No     Mobility Bed Mobility               General bed mobility comments: Pt seated in the chair to start the session.    Transfers Overall transfer level: Needs assistance Equipment used: Rolling walker (2 wheels) Transfers: Sit to/from Stand Sit to Stand: Contact guard assist, Supervision           General transfer comment: Sit to stand from chair followed by ambulation with RW.      Balance Overall balance assessment: Needs assistance Sitting-balance support: Feet supported, No upper extremity supported Sitting balance-Leahy Scale: Good Sitting balance - Comments: seated in recliner   Standing balance support: Reliant on assistive device for balance, During functional activity, Bilateral upper extremity supported Standing balance-Leahy Scale: Fair Standing balance comment: with RW                           ADL either performed or assessed with clinical judgement   ADL Overall ADL's : Needs assistance/impaired     Grooming: Supervision/safety;Contact guard assist;Standing       Lower Body Bathing: Set up;Sitting/lateral leans   Upper Body Dressing : Set up;Sitting   Lower Body Dressing: Set up;Sitting/lateral leans   Toilet Transfer: Contact guard assist;Supervision/safety;Ambulation;Rolling walker (2 wheels) Toilet Transfer Details (indicate cue type and reason): EOB to toilet and back with RW. Toileting- Clothing Manipulation and Hygiene: Set up;Minimal assistance;Sitting/lateral lean       Functional mobility during ADLs:  Supervision/safety;Contact guard assist;Rolling walker (2 wheels)       Vision Baseline Vision/History: 1 Wears glasses Ability to See in Adequate Light: 1 Impaired Patient Visual Report: No change from baseline Vision Assessment?: No apparent visual deficits     Perception Perception: Not tested       Praxis  Praxis: Not tested       Pertinent Vitals/Pain Pain Assessment Pain Assessment: 0-10 Pain Score: 7  Pain Location: R LE Pain Descriptors / Indicators: Other (Comment) (steady) Pain Intervention(s): Limited activity within patient's tolerance, Monitored during session     Extremity/Trunk Assessment Upper Extremity Assessment Upper Extremity Assessment: Generalized weakness (Mild A/ROM defict for shoulder flexion bilaterally.)   Lower Extremity Assessment Lower Extremity Assessment: Defer to PT evaluation   Cervical / Trunk Assessment Cervical / Trunk Assessment: Normal   Communication Communication Communication: Impaired Factors Affecting Communication: Hearing impaired   Cognition Arousal: Alert Behavior During Therapy: WFL for tasks assessed/performed Cognition: No apparent impairments                               Following commands: Intact Following commands impaired: Only follows one step commands consistently     Cueing  General Comments   Cueing Techniques: Verbal cues                 Home Living Family/patient expects to be discharged to:: Private residence Living Arrangements: Alone Available Help at Discharge: Family;Available PRN/intermittently Type of Home: House Home Access: Stairs to enter [employer] of Steps: 3 Entrance Stairs-Rails: Right;Left;Can reach both Home Layout: One level     Bathroom Shower/Tub: Chief Strategy Officer: Standard Bathroom Accessibility: Yes How Accessible: Accessible via wheelchair;Accessible via walker Home Equipment: Rolling Walker (2 wheels);Shower seat;Shower seat - built in;Grab bars - tub/shower;Wheelchair - manual;BSC/3in1   Additional Comments: Family can be around to assist most of the time per pt report.      Prior Functioning/Environment Prior Level of Function : Needs assist       Physical Assist : ADLs (physical);Mobility (physical) Mobility (physical):  Transfers;Gait;Stairs ADLs (physical): Bathing;Dressing;Toileting;IADLs Mobility Comments: Engineer, technical sales with RW. ADLs Comments: A little assist for bathing, dressing, and assisted for IADL's. PRN assist for peri-care.     End of Session Equipment Utilized During Treatment: Rolling walker (2 wheels)  Activity Tolerance: Patient tolerated treatment well Patient left: in chair;with call bell/phone within reach  OT Visit Diagnosis: Unsteadiness on feet (R26.81);Muscle weakness (generalized) (M62.81)                Time: 8883-8872 OT Time Calculation (min): 11 min Charges:  OT General Charges $OT Visit: 1 Visit OT Evaluation $OT Eval Low Complexity: 1 Low  Kaycee Haycraft OT, MOT  Jayson Person 10/15/2024, 11:49 AM

## 2024-10-15 NOTE — Progress Notes (Signed)
 Mobility Specialist Progress Note:    10/15/24 0850  Mobility  Activity Pivoted/transferred from bed to chair  Level of Assistance Contact guard assist, steadying assist  Assistive Device Front wheel walker  Distance Ambulated (ft) 3 ft  Range of Motion/Exercises Active;All extremities  Activity Response Tolerated well  Mobility Referral Yes  Mobility visit 1 Mobility  Mobility Specialist Start Time (ACUTE ONLY) 0850  Mobility Specialist Stop Time (ACUTE ONLY) 0910  Mobility Specialist Time Calculation (min) (ACUTE ONLY) 20 min   Pt received in bed, agreeable to mobility. Required CGA to stand and ambulate with RW. Tolerated well, asx throughout. Call bell in reach, all needs met.  Brinlynn Gorton Mobility Specialist Please contact via Special Educational Needs Teacher or  Rehab office at [phone]

## 2024-10-15 NOTE — TOC Transition Note (Signed)
 Transition of Care [facility]) - Discharge Note   Patient Details  Name: Gabrielle Wood MRN: [id number] Date of Birth: 06/10/1946  [facility]) CM/SW Contact:  Mcarthur Saddie Kim, LCSW Phone Number: 10/15/2024, 11:35 AM   Clinical Narrative:  Pt d/c today. Dorothe with [facility] notified. Home health orders in.      Final next level of care: Home w Home Health Services Barriers to Discharge: Barriers Resolved   Patient Goals and CMS Choice Patient states their goals for this hospitalization and ongoing recovery are:: return home CMS Medicare.gov Compare Post Acute Care list provided to:: Patient Choice offered to / list presented to : Patient, Adult Children      Discharge Placement                       Discharge Plan and Services Additional resources added to the After Visit Summary for   In-house Referral: Clinical Social Work Discharge Planning Services: CM Consult Post Acute Care Choice: Home Health, Resumption of Svcs/PTA Provider                    HH Arranged: RN, PT, OT [facility] Agency: [facility] Date [facility] Agency Contacted: 10/14/24   [facility]: Dorothe  Social Drivers of Health (SDOH) Interventions SDOH Screenings   Food Insecurity: No Food Insecurity (10/14/2024)  Housing: Low Risk  (10/14/2024)  Transportation Needs: No Transportation Needs (10/14/2024)  Utilities: Not At Risk (10/14/2024)  Depression (PHQ2-9): High Risk (11/01/2021)  Financial Resource Strain: Low Risk (08/11/2024)   Received from [facility]  Physical Activity: Inactive (08/11/2024)   Received from [facility]  Social Connections: Unknown (10/14/2024)  Recent Concern: Social Connections - Socially Isolated (10/07/2024)  Stress: No Stress Concern Present (08/11/2024)   Received from [facility]  Tobacco Use: Medium Risk (10/14/2024)  Health Literacy: Medium Risk (08/11/2024)   Received from [facility]     Readmission  Risk Interventions    10/14/2024   12:40 PM 10/11/2024   10:33 AM 10/10/2024    9:49 AM  Readmission Risk Prevention Plan  Transportation Screening Complete Complete Complete  Home Care Screening  Complete   Medication Review (RN CM)  Complete   HRI or Home Care Consult Complete  Complete  Social Work Consult for Recovery Care Planning/Counseling Complete  Complete  Palliative Care Screening Not Applicable  Not Applicable  Medication Review Oceanographer) Complete  Complete

## 2024-10-16 ENCOUNTER — Ambulatory Visit (HOSPITAL_COMMUNITY)
Admission: RE | Admit: 2024-10-16 | Discharge: 2024-10-16 | Disposition: A | Discharge status: Home / Self Care | Source: Ambulatory Visit | Attending: Urology | Admitting: Urology

## 2024-10-16 DIAGNOSIS — N1330 Unspecified hydronephrosis: Secondary | ICD-10-CM | POA: Insufficient documentation

## 2024-10-16 DIAGNOSIS — N281 Cyst of kidney, acquired: Secondary | ICD-10-CM | POA: Diagnosis not present

## 2024-10-18 LAB — CULTURE, BLOOD (ROUTINE X 2)
Culture: NO GROWTH
Culture: NO GROWTH

## 2024-10-19 ENCOUNTER — Encounter: Payer: Self-pay | Admitting: Internal Medicine

## 2024-10-19 ENCOUNTER — Ambulatory Visit: Admitting: Internal Medicine

## 2024-10-19 VITALS — BP 116/74 | HR 71 | Ht 67.0 in | Wt 154.0 lb

## 2024-10-19 DIAGNOSIS — R0609 Other forms of dyspnea: Secondary | ICD-10-CM | POA: Diagnosis not present

## 2024-10-19 DIAGNOSIS — G4734 Idiopathic sleep related nonobstructive alveolar hypoventilation: Secondary | ICD-10-CM

## 2024-10-19 DIAGNOSIS — R531 Weakness: Secondary | ICD-10-CM | POA: Diagnosis not present

## 2024-10-19 NOTE — Progress Notes (Unsigned)
 Gabrielle Wood, female    DOB: 27-Mar-1946    MRN: [id number]   Brief patient profile:  87  yowf  quit smoking in 2017 due to bad breathing got betterand req  no meds  referred to pulmonary clinic in [place]  10/19/2024 by [facility]  for copd / ? 02 dep    Pt not previously seen by PCCM service.    Admit date: 08/14/2024 Discharge date: 08/19/2024   Admitted From: Home Disposition: Home   Recommendations for Outpatient Follow-up:  Follow up with PCP in 1 week with repeat CBC/BMP Outpatient follow-up with palliative care Follow up in ED if symptoms worsen or new appear     Home Health: D/OT Equipment/Devices: None   Discharge Condition: Stable CODE STATUS: DNR Diet recommendation: Heart healthy/carb modified   Brief/Interim Summary: 78 y.o. female with a history of COPD, chronic respiratory failure, diabetes mellitus type 2, hypertension, hyperlipidemia, hypothyroidism, GERD presented with weakness and failure to thrive and was found to have sepsis possibly from [facility] pneumonia.  She was started on IV antibiotics.  During the hospitalization, her condition has improved.  PT/OT recommended SNF but insurance denied.  She is currently medically stable for discharge.  She will be discharged home with home health PT/OT today.   Discharge Diagnoses:  Severe sepsis: Present on admission Possible community-acquired pneumonia: Present on admission Lactic acidosis: Resolved - Treated with broad-spectrum antibiotics:@ d/c she is on day #5.  Sepsis has resolved.  Currently hemodynamically stable.  Cultures negative so far. -Discharge patient home today.  No need for any more antibiotics on discharge   AKI - Resolved.  Treated with IV fluids.  Outpatient follow-up   Failure to thrive - PT/OT recommended SNF but insurance denied.  She is currently medically stable for discharge.  She will be discharged home with home health PT/OT today.   Left hip pain - X-ray showed mild hip  osteoarthritis.  Outpatient follow-up with orthopedic surgery as needed.  Continue analgesic as needed   Acute metabolic encephalopathy Anxiety - Possibly from infection.  Mostly resolved.  Xanax  dose has been readjusted to nightly dose.   COPD -Stable.  No signs of exacerbation   Constipation--continue bowel regimen   Diabetes mellitus type 2 with hyperglycemia - Carb modified diet.  Continue long-acting insulin  along with short acting insulin  with meals.  Outpatient follow-up with PCP   Essential hypertension - Continue metoprolol .  Resume Diovan   CAD Hyperlipidemia - Stable.  Continue aspirin  and Brilinta  along with Lipitor   Anemia of chronic disease - From chronic illnesses.  Hemoglobin currently stable.  Monitor intermittently as an outpatient.     Leukocytosis -Resolved   Hypothyroidism -Continue Synthroid    GERD -Continue Protonix    Chronic pain Neuropathy - Continue reduced dose of Neurontin     History of Present Illness  10/19/2024  Pulmonary/ 1st office eval/ [facility] Monts / [name] trelegy / macrodantin on hold while  on augmentin  Chief Complaint  Patient presents with   Consult    Referred by Dr. Elgin Lam. Pt c/o SOB x 6 months- occurs with and without exertion.    Dyspnea:  using  walker x one year was still going to church prior to most recent  admit  Laps around the house x 17-20 times per day  Cough: minimal mucoid Sleep: HOB  15 degrees / one pillow  SABA use: one a day at most  02: 2lpm  at hs x 6 months    No obvious day  to day or daytime pattern/variability or assoc excess/ purulent sputum or mucus plugs or hemoptysis or cp or chest tightness, subjective wheeze or overt sinus or hb symptoms.    Also denies any obvious fluctuation of symptoms with weather or environmental changes or other aggravating or alleviating factors except as outlined above   No unusual exposure hx or h/o childhood pna/ asthma or knowledge of premature  birth.  Current Allergies, Complete Past Medical History, Past Surgical History, Family History, and Social History were reviewed in [employer] record.  ROS  The following are not active complaints unless bolded Hoarseness, sore throat, dysphagia, dental problems, itching, sneezing,  nasal congestion or discharge of excess mucus or purulent secretions, ear ache,   fever, chills, sweats, unintended wt loss or wt gain, classically pleuritic or exertional cp,  orthopnea pnd or arm/hand swelling  or leg swelling, presyncope, palpitations, abdominal pain, anorexia, nausea, vomiting, diarrhea  or change in bowel habits or change in bladder habits, change in stools or change in urine, dysuria, hematuria,  rash, arthralgias, visual complaints, headache, numbness, weakness or ataxia or problems with walking or coordination,  change in mood or  memory.            Outpatient Medications Prior to Visit  Medication Sig Dispense Refill   acetaminophen  (TYLENOL ) 500 MG tablet Take 1,000 mg by mouth in the morning and at bedtime.     albuterol  (VENTOLIN  HFA) 108 (90 Base) MCG/ACT inhaler Inhale 1-2 puffs into the lungs every 4 (four) hours as needed for wheezing or shortness of breath. 17 each 0   ALPRAZolam  (XANAX ) 1 MG tablet Take 0.5 tablets (0.5 mg total) by mouth at bedtime. 0.5 tablet in am and 0.5 tab at night     aspirin  EC 81 MG tablet Take 1 tablet (81 mg total) by mouth daily. Swallow whole. 30 tablet 11   atorvastatin  (LIPITOR) 80 MG tablet Take 80 mg by mouth daily.     baclofen (LIORESAL) 10 MG tablet Take 10 mg by mouth 2 (two) times daily as needed for muscle spasms.     BRILINTA  60 MG TABS tablet Take 60 mg by mouth 2 (two) times daily.     Continuous Glucose Sensor (FREESTYLE LIBRE 3 PLUS SENSOR) MISC Change sensor every 15 days. 6 each 3   Fluticasone-Umeclidin-Vilant (TRELEGY ELLIPTA) 100-62.5-25 MCG/ACT AEPB Inhale 1 puff into the lungs daily.     gabapentin   (NEURONTIN ) 100 MG capsule Take 1 capsule (100 mg total) by mouth 3 (three) times daily. 90 capsule 0   insulin  degludec (TRESIBA  FLEXTOUCH) 100 UNIT/ML FlexTouch Pen Inject 26 Units into the skin at bedtime. 25 mL 3   insulin  lispro (HUMALOG  KWIKPEN) 100 UNIT/ML KwikPen Inject 8-14 Units into the skin 3 (three) times daily. 36 mL 3   levothyroxine  (SYNTHROID ) 175 MCG tablet Take 175 mcg by mouth daily.     magnesium  oxide (MAG-OX) 400 (240 Mg) MG tablet Take 400 mg by mouth daily.     megestrol (MEGACE) 400 MG/10ML suspension Take 10 mLs (400 mg total) by mouth daily. 300 mL 1   metoprolol  succinate (TOPROL -XL) 25 MG 24 hr tablet Take 25 mg by mouth daily.     nitrofurantoin (MACRODANTIN) 100 MG capsule Take 100 mg by mouth daily.     nitroGLYCERIN (NITROSTAT) 0.4 MG SL tablet Place 0.4 mg under the tongue every 5 (five) minutes as needed for chest pain.     OXYGEN  Inhale 2 L/min into the lungs  at bedtime.     pantoprazole  (PROTONIX ) 40 MG tablet Take 40 mg by mouth daily.     QUEtiapine (SEROQUEL) 50 MG tablet Take 1 tablet (50 mg total) by mouth 3 times/day as needed-between meals & bedtime (delerium/sundowning). 30 tablet 0   senna-docusate (SENOKOT-S) 8.6-50 MG tablet Take 1 tablet by mouth at bedtime as needed for mild constipation. 20 tablet 0   sulfamethoxazole-trimethoprim (BACTRIM DS) 800-160 MG tablet Take 1 tablet by mouth 2 (two) times daily. 10 tablet 0   No facility-administered medications prior to visit.    Past Medical History:  Diagnosis Date   Age-related physical debility 11/17/2021   AKI (acute kidney injury) 09/29/2024   Anxiety 05/28/2023   Arthritis    CAP (community acquired pneumonia) 10/14/2021   Chronic hypoxic respiratory failure (HCC) 06/23/2024   Constipation 06/26/2024   COPD (chronic obstructive pulmonary disease) (HCC)    COPD exacerbation (HCC) 05/27/2024   Diabetes mellitus    DKA (diabetic ketoacidosis) (HCC) 10/14/2021   DVT (deep venous  thrombosis) (HCC)    years ago   Failure to thrive in adult 10/07/2024   Gastritis and duodenitis 04/05/2012   GERD (gastroesophageal reflux disease)    H. 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   UTI (urinary tract infection) 07/20/2024      Objective:     BP 116/74   Pulse 71   Ht 5' 7 (1.702 m) Comment: per pt  Wt 154 lb (69.9 kg)   SpO2 98% Comment: on RA  BMI 24.12 kg/m   SpO2: 98 % (on RA)  very frail wf takes 2 person assist to get from chair to exam table     HEENT : Oropharynx  clear   Nasal turbinates nl    NECK :  without  apparent JVD/ palpable Nodes/TM    LUNGS: no acc muscle use,  Min barrel  contour chest wall with bilateral  slightly decreased bs s audible wheeze and  with  cough on deep insp maneuvers and min  Hyperresonant  to  percussion bilaterally    CV:  RRR  no s3 or murmur or increase in P2, and no edema   ABD:  soft and nontender/ foley in place     MS:   ext warm without deformities Or obvious joint restrictions  calf tenderness, cyanosis or clubbing     SKIN: warm and dry without lesions    NEURO:  alert, approp, nl sensorium with  no motor or cerebellar deficits apparent.     I personally reviewed images and agree with radiology impression as follows:   Chest CTa   10/13/24   1. Negative for acute pulmonary embolus. 2. Clearing of previously noted bilateral lower  lobe subpleural consolidations. Mild atelectasis and or scarring at the bases with trace pleural thickening            Assessment  Assessment & Plan DOE (dyspnea on exertion) Quit smoking 2017 with doe that improved and did not require rx -  much worse since 04/2024 with reported multiple episodes of UTI/ pna > maint on macrodantin intermittently over same period of time - no ILD on CTa from 10/13/24 and  no evidence of limiting airflow obst 10/19/2024 walking study on trelegy so try off trelegy which may interfere with bladder function   When respiratory symptoms begin or become refractory well after a patient reports complete smoking cessation,  Especially when this wasn't the case while they were smoking, a red flag is raised based on the work of Dr Genette which states:  if you quit smoking when your best day FEV1 is still well preserved it is highly unlikely you will progress to severe disease.  That is to say, once the smoking stops,  the symptoms should not suddenly erupt or markedly worsen.  If so, the differential diagnosis should include  obesity/deconditioning(which appears quite severe here) ,  LPR/Reflux/Aspiration syndromes,  occult CHF, or  especially side effect of medications commonly used in this population ( at risk of ILD from macrodantin though for now does not appear to be a problem  >>> f/u in 4 weeks off trelegy but can start back if worse doe off it.   Nocturnal hypoxemia On 2lpm NP at HS and initial pulmonary eval 10/19/2024  - .10/19/2024   Walked on RA   approx 100  ft  @ very slow / walker pace, stopped due to tired with lowest 02 sats 96%  s sob    Does not appear to be limited by doe or hypoxemia at this point so >>> continue noct 02 at 2lpm and f/u here prn  >>> advised:  Make sure you check your oxygen  saturation at your highest level of activity(NOT after you stop)  to be sure it stays over 90% and keep track of it at least once a week, more often  if breathing getting worse, and let me know if losing ground. (Collect the dots to connect the dots approach)  which is the best way to detect incipient macrodantin lung dz if/ when starts back on it  Each maintenance medication was reviewed in detail including emphasizing most importantly the difference between maintenance and prns and under what circumstances the prns are to be triggered using an action plan format where appropriate.  Total time for H and P, chart review, counseling, reviewing dpi/02/pulse ox  device(s) , directly observing portions of ambulatory 02 saturation study/ and generating customized AVS unique to this office visit / same day charting = 49 min new pt eval                  AVS  Patient Instructions   Make sure you check your oxygen  saturation at your highest level of activity(NOT after you stop)  to be sure it stays over 90% and keep track of it at least once a week, more often if breathing getting worse, and let me know if losing ground. (Collect the dots to connect the dots approach)     Ok to try off trelegy to see if it has any benefit or not    Please schedule a follow up office visit in 4 weeks, sooner if needed       Ozell America, MD 10/20/2024

## 2024-10-19 NOTE — Patient Instructions (Addendum)
 Make sure you check your oxygen  saturation at your highest level of activity(NOT after you stop)  to be sure it stays over 90% and keep track of it at least once a week, more often if breathing getting worse, and let me know if losing ground. (Collect the dots to connect the dots approach)     Ok to try off trelegy to see if it has any benefit or not    Please schedule a follow up office visit in 4 weeks, sooner if needed

## 2024-10-20 DIAGNOSIS — R0609 Other forms of dyspnea: Secondary | ICD-10-CM | POA: Insufficient documentation

## 2024-10-20 DIAGNOSIS — G4734 Idiopathic sleep related nonobstructive alveolar hypoventilation: Secondary | ICD-10-CM | POA: Insufficient documentation

## 2024-10-20 NOTE — Assessment & Plan Note (Addendum)
 Quit smoking 2017 with doe that improved and did not require rx -  much worse since 04/2024 with reported multiple episodes of UTI/ pna > maint on macrodantin intermittently over same period of time - no ILD on CTa from 10/13/24 and  no evidence of limiting airflow obst 10/19/2024 walking study on trelegy so try off trelegy which may interfere with bladder function   When respiratory symptoms begin or become refractory well after a patient reports complete smoking cessation,  Especially when this wasn't the case while they were smoking, a red flag is raised based on the work of Dr Genette which states:  if you quit smoking when your best day FEV1 is still well preserved it is highly unlikely you will progress to severe disease.  That is to say, once the smoking stops,  the symptoms should not suddenly erupt or markedly worsen.  If so, the differential diagnosis should include  obesity/deconditioning(which appears quite severe here) ,  LPR/Reflux/Aspiration syndromes,  occult CHF, or  especially side effect of medications commonly used in this population ( at risk of ILD from macrodantin though for now does not appear to be a problem  >>> f/u in 4 weeks off trelegy but can start back if worse doe off it.

## 2024-10-20 NOTE — Assessment & Plan Note (Addendum)
 On 2lpm NP at [facility] and initial pulmonary eval 10/19/2024  - .10/19/2024   Walked on RA   approx 100  ft  @ very slow / walker pace, stopped due to tired with lowest 02 sats 96%  s sob    Does not appear to be limited by doe or hypoxemia at this point so >>> continue noct 02 at 2lpm and f/u here prn  >>> advised:  Make sure you check your oxygen  saturation at your highest level of activity(NOT after you stop)  to be sure it stays over 90% and keep track of it at least once a week, more often if breathing getting worse, and let me know if losing ground. (Collect the dots to connect the dots approach)  which is the best way to detect incipient macrodantin lung dz if/ when starts back on it  Each maintenance medication was reviewed in detail including emphasizing most importantly the difference between maintenance and prns and under what circumstances the prns are to be triggered using an action plan format where appropriate.  Total time for H and P, chart review, counseling, reviewing dpi/02/pulse ox  device(s) , directly observing portions of ambulatory 02 saturation study/ and generating customized AVS unique to this office visit / same day charting = 49 min new pt eval

## 2024-10-21 LAB — BLOOD GAS, VENOUS
Acid-Base Excess: 1 mmol/L (ref 0.0–2.0)
Bicarbonate: 23.6 mmol/L (ref 20.0–28.0)
Drawn by: 69769
O2 Saturation: 80.8 %
Patient temperature: 37.1
pCO2, Ven: 31 mmHg — ABNORMAL LOW (ref 44–60)
pH, Ven: 7.49 — ABNORMAL HIGH (ref 7.25–7.43)
pO2, Ven: 44 mmHg (ref 32–45)

## 2024-10-22 DIAGNOSIS — E1169 Type 2 diabetes mellitus with other specified complication: Secondary | ICD-10-CM | POA: Diagnosis not present

## 2024-10-22 DIAGNOSIS — Z299 Encounter for prophylactic measures, unspecified: Secondary | ICD-10-CM | POA: Diagnosis not present

## 2024-10-22 DIAGNOSIS — N390 Urinary tract infection, site not specified: Secondary | ICD-10-CM | POA: Diagnosis not present

## 2024-10-22 DIAGNOSIS — I10 Essential (primary) hypertension: Secondary | ICD-10-CM | POA: Diagnosis not present

## 2024-10-22 DIAGNOSIS — R4182 Altered mental status, unspecified: Secondary | ICD-10-CM | POA: Diagnosis not present

## 2024-10-22 DIAGNOSIS — R300 Dysuria: Secondary | ICD-10-CM | POA: Diagnosis not present

## 2024-10-23 DIAGNOSIS — R531 Weakness: Secondary | ICD-10-CM | POA: Diagnosis not present

## 2024-10-24 ENCOUNTER — Emergency Department (HOSPITAL_COMMUNITY)
Admission: EM | Admit: 2024-10-24 | Discharge: 2024-10-24 | Disposition: A | Discharge status: Home / Self Care | Attending: Emergency Medicine | Admitting: Emergency Medicine

## 2024-10-24 ENCOUNTER — Encounter (HOSPITAL_COMMUNITY): Payer: Self-pay | Admitting: Emergency Medicine

## 2024-10-24 ENCOUNTER — Other Ambulatory Visit: Payer: Self-pay

## 2024-10-24 DIAGNOSIS — E119 Type 2 diabetes mellitus without complications: Secondary | ICD-10-CM | POA: Insufficient documentation

## 2024-10-24 DIAGNOSIS — Z794 Long term (current) use of insulin: Secondary | ICD-10-CM | POA: Insufficient documentation

## 2024-10-24 DIAGNOSIS — R339 Retention of urine, unspecified: Secondary | ICD-10-CM | POA: Diagnosis not present

## 2024-10-24 DIAGNOSIS — Z7982 Long term (current) use of aspirin: Secondary | ICD-10-CM | POA: Insufficient documentation

## 2024-10-24 DIAGNOSIS — Z79899 Other long term (current) drug therapy: Secondary | ICD-10-CM | POA: Diagnosis not present

## 2024-10-24 DIAGNOSIS — T83098A Other mechanical complication of other indwelling urethral catheter, initial encounter: Secondary | ICD-10-CM | POA: Diagnosis not present

## 2024-10-24 DIAGNOSIS — J449 Chronic obstructive pulmonary disease, unspecified: Secondary | ICD-10-CM | POA: Insufficient documentation

## 2024-10-24 DIAGNOSIS — Y732 Prosthetic and other implants, materials and accessory gastroenterology and urology devices associated with adverse incidents: Secondary | ICD-10-CM | POA: Insufficient documentation

## 2024-10-24 LAB — URINALYSIS, ROUTINE W REFLEX MICROSCOPIC
Bilirubin Urine: NEGATIVE
Glucose, UA: >=500 mg/dL — AB
Hgb urine dipstick: NEGATIVE
Ketones, ur: NEGATIVE mg/dL
Nitrite: NEGATIVE
Protein, ur: NEGATIVE mg/dL
Specific Gravity, Urine: 1.003 — ABNORMAL LOW (ref 1.005–1.030)
pH: 6 (ref 5.0–8.0)

## 2024-10-24 LAB — CBG MONITORING, ED: Glucose-Capillary: 260 mg/dL — ABNORMAL HIGH (ref 70–99)

## 2024-10-24 MED ORDER — OXYCODONE HCL 5 MG PO TABS: 5.0000 mg | ORAL_TABLET | Freq: Once | ORAL | Status: DC

## 2024-10-24 NOTE — ED Notes (Signed)
 Bladder scan completed. Scan showed  around 400 - 500 mL in the bladder

## 2024-10-24 NOTE — ED Provider Notes (Signed)
 [place] EMERGENCY DEPARTMENT AT [facility] Provider Note   CSN: [id number] Arrival date & time: 10/24/24  9060     Patient presents with: Foley leaking   Gabrielle Wood is a 78 y.o. female patient with history of diabetes on insulin , urinary retention with frequent UTIs and indwelling Foley catheter x 3 weeks presents with complaints of leakage around her Foley with decreased output over the past few days.  Now complaining of suprapubic abdominal pain without any associated vomiting diarrhea.  Just completed a course of Augmentin  a few days ago.   HPI    Past Medical History:  Diagnosis Date   Age-related physical debility 11/17/2021   AKI (acute kidney injury) 09/29/2024   Anxiety 05/28/2023   Arthritis    CAP (community acquired pneumonia) 10/14/2021   Chronic hypoxic respiratory failure (HCC) 06/23/2024   Constipation 06/26/2024   COPD (chronic obstructive pulmonary disease) (HCC)    COPD exacerbation (HCC) 05/27/2024   Diabetes mellitus    DKA (diabetic ketoacidosis) (HCC) 10/14/2021   DVT (deep venous thrombosis) (HCC)    years ago   Failure to thrive in adult 10/07/2024   Gastritis and duodenitis 04/05/2012   GERD (gastroesophageal reflux disease)    H. 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  Pathology with chronic H. pylori gastritis.   EUS  05/14/2012   Normal pancreas, gallbladder,biliary tree/ Incidental finding of simple-appearing right renal cyst.   TONSILLECTOMY     TUBAL LIGATION     VEIN SURGERY     stripping years ago but recent laser surgery; bilateral legs     Prior to Admission medications   Medication Sig Start Date End Date Taking? Authorizing Provider  acetaminophen  (TYLENOL ) 500 MG tablet Take 1,000 mg by mouth in the morning and at bedtime.    [provider]  albuterol  (VENTOLIN  HFA) 108 (90 Base) MCG/ACT inhaler Inhale 1-2 puffs into the lungs every 4 (four) hours as needed for wheezing or shortness of breath. 09/30/23   Ula Prentice SAUNDERS, MD  ALPRAZolam  (XANAX ) 1 MG tablet Take 0.5 tablets (0.5 mg total) by mouth at bedtime. 0.5 tablet in am and 0.5 tab at night 08/19/24   Cheryle Page, MD  aspirin  EC 81 MG tablet Take 1 tablet (81 mg total) by mouth daily. Swallow whole. 10/02/24   Mcarthur Pick, MD  atorvastatin  (LIPITOR) 80 MG tablet Take 80 mg by mouth daily. 11/24/21   [provider]  baclofen (LIORESAL) 10 MG tablet Take 10 mg by mouth 2 (two) times daily as needed for muscle spasms. 08/06/24   [provider]  BRILINTA  60 MG TABS tablet Take  60 mg by mouth 2 (two) times daily. 09/07/24   [provider]  Continuous Glucose Sensor (FREESTYLE LIBRE 3 PLUS SENSOR) MISC Change sensor every 15 days. 09/16/24   Therisa Benton PARAS, NP  Fluticasone-Umeclidin-Vilant (TRELEGY ELLIPTA) 100-62.5-25 MCG/ACT AEPB Inhale 1 puff into the lungs daily.    [provider]  gabapentin  (NEURONTIN ) 100 MG capsule Take 1 capsule (100 mg total) by mouth 3 (three) times daily. 08/19/24   Cheryle Page, MD  insulin  degludec (TRESIBA  FLEXTOUCH) 100 UNIT/ML FlexTouch Pen Inject 26  Units into the skin at bedtime. 09/16/24   Therisa Benton PARAS, NP  insulin  lispro (HUMALOG  KWIKPEN) 100 UNIT/ML KwikPen Inject 8-14 Units into the skin 3 (three) times daily. 09/16/24   Therisa Benton PARAS, NP  levothyroxine  (SYNTHROID ) 175 MCG tablet Take 175 mcg by mouth daily. 10/08/24   [provider]  magnesium  oxide (MAG-OX) 400 (240 Mg) MG tablet Take 400 mg by mouth daily.    [provider]  megestrol  (MEGACE ) 400 MG/10ML suspension Take 10 mLs (400 mg total) by mouth daily. 10/11/24   Shahmehdi, Adriana LABOR, MD  metoprolol  succinate (TOPROL -XL) 25 MG 24 hr tablet Take 25 mg by mouth daily.    [provider]  nitrofurantoin  (MACRODANTIN ) 100 MG capsule Take 100 mg by mouth daily.    [provider]  nitroGLYCERIN  (NITROSTAT ) 0.4 MG SL tablet Place 0.4 mg under the tongue every 5 (five) minutes as needed for chest pain.    [provider]  OXYGEN  Inhale 2 L/min into the lungs at bedtime.    [provider]  pantoprazole  (PROTONIX ) 40 MG tablet Take 40 mg by mouth daily. 06/06/21   [provider]  QUEtiapine  (SEROQUEL ) 50 MG tablet Take 1 tablet (50 mg total) by mouth 3 times/day as needed-between meals & bedtime (delerium/sundowning). 10/15/24   Arlon Carliss ORN, DO  senna-docusate (SENOKOT-S) 8.6-50 MG tablet Take 1 tablet by mouth at bedtime as needed for mild constipation. 10/02/24   Mcarthur Pick, MD  sulfamethoxazole -trimethoprim  (BACTRIM  DS) 800-160 MG tablet Take 1 tablet by mouth 2 (two) times daily. 10/15/24   Arlon Carliss ORN, DO  fexofenadine (ALLEGRA) 60 MG tablet Take by mouth.  04/13/21  [provider]    Allergies: Amlodipine , Ciprocin-fluocin-procin [fluocinolone], Lipitor [atorvastatin ], Nebivolol, Nitrofuran derivatives, Lisinopril, and Red yeast rice [cholestin]    Review of Systems  Gastrointestinal:  Positive for abdominal pain.    Updated Vital Signs BP 117/84   Pulse 92   Temp 97.8 F (36.6 C)  (Oral)   Resp (!) 22   SpO2 98%   Physical Exam Vitals and nursing note reviewed.  Constitutional:      General: She is not in acute distress.    Appearance: She is well-developed.  HENT:     Head: Normocephalic and atraumatic.  Eyes:     Conjunctiva/sclera: Conjunctivae normal.  Cardiovascular:     Rate and Rhythm: Normal rate and regular rhythm.     Heart sounds: No murmur heard. Pulmonary:     Effort: Pulmonary effort is normal. No respiratory distress.     Breath sounds: Normal breath sounds.  Abdominal:     Palpations: Abdomen is soft.     Tenderness: There is abdominal tenderness.     Comments: Lower generalized abdominal tenderness, soft nondistended.  Musculoskeletal:        General: No swelling.     Cervical back: Neck supple.  Skin:    General: Skin is warm and dry.  Capillary Refill: Capillary refill takes less than 2 seconds.  Neurological:     Mental Status: She is alert.  Psychiatric:        Mood and Affect: Mood normal.     (all labs ordered are listed, but only abnormal results are displayed) Labs Reviewed  URINALYSIS, ROUTINE W REFLEX MICROSCOPIC - Abnormal; Notable for the following components:      Result Value   Color, Urine STRAW (*)    Specific Gravity, Urine 1.003 (*)    Glucose, UA >=500 (*)    Leukocytes,Ua TRACE (*)    Bacteria, UA RARE (*)    All other components within normal limits  CBG MONITORING, ED - Abnormal; Notable for the following components:   Glucose-Capillary 260 (*)    All other components within normal limits  URINE CULTURE    EKG: None  Radiology: No results found.   Procedures   Medications Ordered in the ED  oxyCODONE  (Oxy IR/ROXICODONE ) immediate release tablet 5 mg (has no administration in time range)    Clinical Course as of 10/24/24 1222  Sat Oct 24, 2024  1129 Patient with history of urinary retention and frequent UTIs with indwelling Foley catheter x 3 weeks evaluated for complaints of leakage  around her Foley, decreased output and lower abdominal pain over the past few days.  Upon arrival she is hemodynamically stable.  On exam she appears uncomfortable with generalized lower abdominal tenderness.  Bladder scan is consistent with retention with 400 to 500 mL.  Will replace catheter and obtain urine [JT]  1220 UA with trace leukocytes negative nitrites, rare bacteria.  Taken with indwelling Foley.  Will send in culture.  She reports marked improvement of her symptoms after exchange.  She does describe some continued pain.  Requesting a dose of Oxy.  Will provide here and discharged home.  Encouraged to follow-up with urology.  Son at bedside and patient are understanding agreement plan. [JT]    Clinical Course User Index [JT] Donnajean Lynwood DEL, PA-C                                 Medical Decision Making  This patient presents to the ED with chief complaint(s) of foley leakage .  The complaint involves an extensive differential diagnosis and also carries with it a high risk of complications and morbidity.   Pertinent past medical history as listed in HPI  The differential diagnosis includes  Obstruction, positioning, UTI, retention Additional history obtained: Additional history obtained from family Records reviewed Care Everywhere/External Records  Disposition:   Patient will be discharged home. The patient has been appropriately medically screened and/or stabilized in the ED. I have low suspicion for any other emergent medical condition which would require further screening, evaluation or treatment in the ED or require inpatient management. At time of discharge the patient is hemodynamically stable and in no acute distress. I have discussed work-up results and diagnosis with patient and answered all questions. Patient is agreeable with discharge plan. We discussed strict return precautions for returning to the emergency department and they verbalized understanding.     Social  Determinants of Health:   none  This note was dictated with voice recognition software.  Despite best efforts at proofreading, errors may have occurred which can change the documentation meaning.       Final diagnoses:  Urinary retention    ED Discharge Orders  None          Donnajean Lynwood VEAR DEVONNA 10/24/24 1222    Cleotilde Rogue, MD 10/26/24 [phone]

## 2024-10-24 NOTE — Discharge Instructions (Addendum)
 You were evaluated in the emergency room for catheter issue.  You were found to be retaining urine.  Your catheter was exchanged.  Please follow-up with your urologist as scheduled.  If you experience any new or worsening symptoms please return to the emergency room.

## 2024-10-24 NOTE — ED Notes (Signed)
 Pt/family received d/c paperwork at this time. After going over the paperwork any questions, comments, or concerns were answered to the best of this nurse's knowledge. The pt/family verbally acknowledged the teachings/instructions.

## 2024-10-24 NOTE — ED Notes (Signed)
 Pt tolerated well for the replacement of the foley cath. This nurse noticed swelling in the perineum area and red where the secured placement is placed. This nurse performed family/pt teachings on clean the area every day and rotate areas for the catheter anchoring device.

## 2024-10-24 NOTE — ED Triage Notes (Signed)
 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 Frequent UTI's. Does f/u with urology and has had indwelling foley catheter x 3 weeks due to urologist wanting kidneys and bladder to relax per son at bedside. Pt states for two days now it is leaking and she is soaking her depends and pads. A/o. Nad. Urine in bag is clear light yellow.

## 2024-10-25 LAB — URINE CULTURE: Culture: <10000 — AB

## 2024-11-02 NOTE — Progress Notes (Unsigned)
 Assessment: 1.  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  This may actually be longstanding based on what looks like extrarenal pelvis on prior films  2.  Recurrent urinary tract infections, most recent urine culture from the 22nd of last month negative  3.  Incomplete bladder emptying.  Currently being treated with Foley catheter was changed on November 22   Plan: 1.  For now, continue catheter  2.  I will arrange for her to have Lasix renogram to be done at [facility]  3.  If significant obstruction, consider palliative stenting versus pyeloplasty  4.  Consideration should be given to teaching self-catheterization if appropriate for patient  5.  Description of the above plan typed out for patient/friend   History of Present Illness:  11.11.2025: Gabrielle Wood is a 78 y.o. female who is seen in consultation from Rosamond Mon B, MD for evaluation of current urinary tract infections and radiographic evidence of a UPJ obstruction.  Admitted to the hospital on the 28th of last month with sepsis.  She has multiple medical issues.  CT showed right hydronephrosis to the UPJ.  Urine cultures revealed multiple organisms.  Blood cultures were negative.  She comes in today with her son Garrel.  He states that she has had multiple urinary tract infections.  She now has a catheter, as she was having high residual urine volumes.  She is quite weak, has difficulty walking.  Physical therapy is eventually planned.  12.2.2025: Renal ultrasound from November 14 revealed findings consistent with right hydronephrosis/UPJ obstruction.  Her catheter was replaced on the 22nd of last month as it was not draining appropriately.   Past Medical History:  Past Medical History:  Diagnosis Date   Age-related physical debility 11/17/2021   AKI (acute kidney injury) 09/29/2024   Anxiety 05/28/2023   Arthritis    CAP (community acquired pneumonia) 10/14/2021   Chronic hypoxic respiratory failure  (HCC) 06/23/2024   Constipation 06/26/2024   COPD (chronic obstructive pulmonary disease) (HCC)    COPD exacerbation (HCC) 05/27/2024   Diabetes mellitus    DKA (diabetic ketoacidosis) (HCC) 10/14/2021   DVT (deep venous thrombosis) (HCC)    years ago   Failure to thrive in adult 10/07/2024   Gastritis and duodenitis 04/05/2012   GERD (gastroesophageal reflux disease)    H. 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  Pathology with chronic H. pylori gastritis.   EUS  05/14/2012   Normal pancreas, gallbladder,biliary tree/ Incidental finding of simple-appearing right renal cyst.   TONSILLECTOMY     TUBAL LIGATION     VEIN SURGERY  stripping years ago but recent laser surgery; bilateral legs    Allergies:  Allergies  Allergen Reactions   Amlodipine  Other (See Comments)    Unknown    Ciprocin-Fluocin-Procin [Fluocinolone] Other (See Comments)    Nightmares   Lipitor [Atorvastatin ] Other (See Comments)    Muscle aches, pain   Nebivolol Other (See Comments)    Unknown    Nitrofuran Derivatives Other (See Comments)    Unknown    Lisinopril Cough    Red Yeast Rice [Cholestin] Itching and Rash    Family History:  Family History  Problem Relation Age of Onset   Pancreatic cancer Mother        diagnosed at age 102s, died from heart attack   Inflammatory bowel disease Brother        crohn's, his dgt deceased from colon cancer at age 78   Colon cancer Neg Hx     Social History:  Social History   Tobacco Use   Smoking status: Former    Current packs/day: 1.00    Average packs/day: 1 pack/day for 55.0 years (55.0 ttl pk-yrs)    Types: Cigarettes    Start date: 10/19/1969   Smokeless tobacco: Never   Tobacco comments:    Quit in 2017  Vaping Use   Vaping status: Never Used  Substance Use Topics   Alcohol  use: No   Drug use: No    Results:  I have reviewed referring/prior physicians records--both recent hospitalizations reviewed  I have reviewed urinalysis results from November 22--21-50 white blood cells, rare bacteria.  Culture revealed insignificant growth of bacteria.  I reviewed prior imaging studies--renal ultrasound images from November 14.

## 2024-11-03 ENCOUNTER — Ambulatory Visit: Admitting: Urology

## 2024-11-03 VITALS — BP 123/64 | HR 75

## 2024-11-03 DIAGNOSIS — R339 Retention of urine, unspecified: Secondary | ICD-10-CM

## 2024-11-03 DIAGNOSIS — Z87440 Personal history of urinary (tract) infections: Secondary | ICD-10-CM | POA: Diagnosis not present

## 2024-11-03 DIAGNOSIS — N131 Hydronephrosis with ureteral stricture, not elsewhere classified: Secondary | ICD-10-CM

## 2024-11-03 DIAGNOSIS — N1330 Unspecified hydronephrosis: Secondary | ICD-10-CM

## 2024-11-03 DIAGNOSIS — N390 Urinary tract infection, site not specified: Secondary | ICD-10-CM

## 2024-11-03 DIAGNOSIS — N281 Cyst of kidney, acquired: Secondary | ICD-10-CM

## 2024-11-07 DIAGNOSIS — J441 Chronic obstructive pulmonary disease with (acute) exacerbation: Secondary | ICD-10-CM | POA: Diagnosis not present

## 2024-11-17 ENCOUNTER — Encounter: Payer: Self-pay | Admitting: Internal Medicine

## 2024-11-17 ENCOUNTER — Ambulatory Visit: Admitting: Internal Medicine

## 2024-11-17 VITALS — BP 109/69 | HR 106 | Ht 67.0 in | Wt 156.0 lb

## 2024-11-17 DIAGNOSIS — R0609 Other forms of dyspnea: Secondary | ICD-10-CM

## 2024-11-17 DIAGNOSIS — G4734 Idiopathic sleep related nonobstructive alveolar hypoventilation: Secondary | ICD-10-CM

## 2024-11-17 NOTE — Assessment & Plan Note (Addendum)
 On 2lpm NP at [facility] and initial pulmonary eval 10/19/2024  - .10/19/2024   Walked on RA   approx 100  ft  @ very slow / walker pace, stopped due to tired with lowest 02 sats 96%  s sob    ->>>  11/17/2024 d/c 02 at pt's request and pulmonary fu is prn       Each maintenance medication was reviewed in detail including emphasizing most importantly the difference between maintenance and prns and under what circumstances the prns are to be triggered using an action plan format where appropriate.  Total time for H and P, chart review, counseling, reviewing pulse ox  device(s) and generating customized AVS unique to this office visit / same day charting = 24 min summary f/u ov

## 2024-11-17 NOTE — Progress Notes (Signed)
 Gabrielle Wood, female    DOB: 03/09/1946    MRN: [id number]   Brief patient profile:  25  yowf  quit smoking in 2017 due to bad breathing got betterand req  no meds  referred to pulmonary clinic in [place]  10/19/2024 by [facility]  for copd / ? 02 dep    Pt not previously seen by PCCM service.    Admit date: 08/14/2024 Discharge date: 08/19/2024   Admitted From: Home Disposition: Home   Recommendations for Outpatient Follow-up:  Follow up with PCP in 1 week with repeat CBC/BMP Outpatient follow-up with palliative care Follow up in ED if symptoms worsen or new appear     Home Health: D/OT Equipment/Devices: None   Discharge Condition: Stable CODE STATUS: DNR Diet recommendation: Heart healthy/carb modified   Brief/Interim Summary: 78 y.o. female with a history of COPD, chronic respiratory failure, diabetes mellitus type 2, hypertension, hyperlipidemia, hypothyroidism, GERD presented with weakness and failure to thrive and was found to have sepsis possibly from [facility] pneumonia.  She was started on IV antibiotics.  During the hospitalization, her condition has improved.  PT/OT recommended SNF but insurance denied.  She is currently medically stable for discharge.  She will be discharged home with home health PT/OT today.   Discharge Diagnoses:  Severe sepsis: Present on admission Possible community-acquired pneumonia: Present on admission Lactic acidosis: Resolved - Treated with broad-spectrum antibiotics:@ d/c she is on day #5.  Sepsis has resolved.  Currently hemodynamically stable.  Cultures negative so far. -Discharge patient home today.  No need for any more antibiotics on discharge   AKI - Resolved.  Treated with IV fluids.  Outpatient follow-up   Failure to thrive - PT/OT recommended SNF but insurance denied.  She is currently medically stable for discharge.  She will be discharged home with home health PT/OT today.   Left hip pain - X-ray showed mild hip  osteoarthritis.  Outpatient follow-up with orthopedic surgery as needed.  Continue analgesic as needed   Acute metabolic encephalopathy Anxiety - Possibly from infection.  Mostly resolved.  Xanax  dose has been readjusted to nightly dose.   COPD -Stable.  No signs of exacerbation   Constipation--continue bowel regimen   Diabetes mellitus type 2 with hyperglycemia - Carb modified diet.  Continue long-acting insulin  along with short acting insulin  with meals.  Outpatient follow-up with PCP   Essential hypertension - Continue metoprolol .  Resume Diovan   CAD Hyperlipidemia - Stable.  Continue aspirin  and Brilinta  along with Lipitor   Anemia of chronic disease - From chronic illnesses.  Hemoglobin currently stable.  Monitor intermittently as an outpatient.     Leukocytosis -Resolved   Hypothyroidism -Continue Synthroid    GERD -Continue Protonix    Chronic pain Neuropathy - Continue reduced dose of Neurontin     History of Present Illness  10/19/2024  Pulmonary/ 1st Wood eval/ Gabrielle Wood / Gabrielle Wood trelegy / macrodantin  on hold while  on augmentin  Chief Complaint  Patient presents with   Consult    Referred by Dr. Elgin Wood. Pt c/o SOB x 6 months- occurs with and without exertion.    Dyspnea:  using  walker x one year was still going to church prior to most recent  admit  Laps around the house x 17-20 times per day  Cough: minimal mucoid Sleep: HOB  15 degrees / one pillow  SABA use: one a day at most  02: 2lpm  at hs x 6 months Patient Instructions   Make sure  you check your oxygen  saturation at your highest level of activity(NOT after you stop)  to be sure it stays over 90% and keep track of it at least once a week, more often if breathing getting worse, and let me know if losing ground. (Collect the dots to connect the dots approach)     Ok to try off trelegy to see if it has any benefit or not    11/17/2024  f/u ov/[facility] Wood/Gabrielle Wood re: DOE/ 02 dep  hs/ macrodantin  exp  maint on nothing  at al now, not even using 02  Chief Complaint  Patient presents with   Shortness of Breath    F/u - has been denied o2   Dyspnea:  room to room 40 times per day definitely improving  Cough: none  Sleeping: 15 degrees hosp bed one pillow s  esp cc  SABA use: not needed  02: 2lpm hs not using during the day       No obvious day to day or daytime variability or assoc excess/ purulent sputum or mucus plugs or hemoptysis or cp or chest tightness, subjective wheeze or overt sinus or hb symptoms.    Also denies any obvious fluctuation of symptoms with weather or environmental changes or other aggravating or alleviating factors except as outlined above   No unusual exposure hx or h/o childhood pna/ asthma or knowledge of premature birth.  Current Allergies, Complete Past Medical History, Past Surgical History, Family History, and Social History were reviewed in [employer] record.  ROS  The following are not active complaints unless bolded Hoarseness, sore throat, dysphagia, dental problems, itching, sneezing,  nasal congestion or discharge of excess mucus or purulent secretions, ear ache,   fever, chills, sweats, unintended wt loss or wt gain, classically pleuritic or exertional cp,  orthopnea pnd or arm/hand swelling  or leg swelling, presyncope, palpitations, abdominal pain, anorexia, nausea, vomiting, diarrhea  or change in bowel habits or change in bladder habits, change in stools or change in urine, dysuria, hematuria,  rash, arthralgias, visual complaints, headache, numbness, weakness or ataxia or problems with walking or coordination,  change in mood or  memory.         Outpatient Medications Prior to Visit  Medication Sig Dispense Refill   acetaminophen  (TYLENOL ) 500 MG tablet Take 1,000 mg by mouth in the morning and at bedtime.     albuterol  (VENTOLIN  HFA) 108 (90 Base) MCG/ACT inhaler Inhale 1-2 puffs into the lungs every  4 (four) hours as needed for wheezing or shortness of breath. 17 each 0   ALPRAZolam  (XANAX ) 1 MG tablet Take 0.5 tablets (0.5 mg total) by mouth at bedtime. 0.5 tablet in am and 0.5 tab at night     aspirin  EC 81 MG tablet Take 1 tablet (81 mg total) by mouth daily. Swallow whole. 30 tablet 11   atorvastatin  (LIPITOR) 80 MG tablet Take 80 mg by mouth daily.     baclofen (LIORESAL) 10 MG tablet Take 10 mg by mouth 2 (two) times daily as needed for muscle spasms.     BRILINTA  60 MG TABS tablet Take 60 mg by mouth 2 (two) times daily.     Continuous Glucose Sensor (FREESTYLE LIBRE 3 PLUS SENSOR) MISC Change sensor every 15 days. 6 each 3   Fluticasone-Umeclidin-Vilant (TRELEGY ELLIPTA) 100-62.5-25 MCG/ACT AEPB Inhale 1 puff into the lungs daily.     gabapentin  (NEURONTIN ) 100 MG capsule Take 1 capsule (100 mg total) by mouth 3 (three)  times daily. 90 capsule 0   insulin  degludec (TRESIBA  FLEXTOUCH) 100 UNIT/ML FlexTouch Pen Inject 26 Units into the skin at bedtime. 25 mL 3   insulin  lispro (HUMALOG  KWIKPEN) 100 UNIT/ML KwikPen Inject 8-14 Units into the skin 3 (three) times daily. 36 mL 3   levothyroxine  (SYNTHROID ) 175 MCG tablet Take 175 mcg by mouth daily.     magnesium  oxide (MAG-OX) 400 (240 Mg) MG tablet Take 400 mg by mouth daily.     megestrol  (MEGACE ) 400 MG/10ML suspension Take 10 mLs (400 mg total) by mouth daily. 300 mL 1   metoprolol  succinate (TOPROL -XL) 25 MG 24 hr tablet Take 25 mg by mouth daily.     nitroGLYCERIN  (NITROSTAT ) 0.4 MG SL tablet Place 0.4 mg under the tongue every 5 (five) minutes as needed for chest pain.     OXYGEN  Inhale 2 L/min into the lungs at bedtime.     pantoprazole  (PROTONIX ) 40 MG tablet Take 40 mg by mouth daily.     senna-docusate (SENOKOT-S) 8.6-50 MG tablet Take 1 tablet by mouth at bedtime as needed for mild constipation. 20 tablet 0   nitrofurantoin  (MACRODANTIN ) 100 MG capsule Take 100 mg by mouth daily. (Patient not taking: Reported on 11/17/2024)      QUEtiapine  (SEROQUEL ) 50 MG tablet Take 1 tablet (50 mg total) by mouth 3 times/day as needed-between meals & bedtime (delerium/sundowning). (Patient not taking: Reported on 11/17/2024) 30 tablet 0   sulfamethoxazole -trimethoprim  (BACTRIM  DS) 800-160 MG tablet Take 1 tablet by mouth 2 (two) times daily. (Patient not taking: Reported on 11/17/2024) 10 tablet 0   No facility-administered medications prior to visit.          Past Medical History:  Diagnosis Date   Age-related physical debility 11/17/2021   AKI (acute kidney injury) 09/29/2024   Anxiety 05/28/2023   Arthritis    CAP (community acquired pneumonia) 10/14/2021   Chronic hypoxic respiratory failure (HCC) 06/23/2024   Constipation 06/26/2024   COPD (chronic obstructive pulmonary disease) (HCC)    COPD exacerbation (HCC) 05/27/2024   Diabetes mellitus    DKA (diabetic ketoacidosis) (HCC) 10/14/2021   DVT (deep venous thrombosis) (HCC)    years ago   Failure to thrive in adult 10/07/2024   Gastritis and duodenitis 04/05/2012   GERD (gastroesophageal reflux disease)    H. 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   UTI (urinary tract infection) 07/20/2024      Objective:     Wt Readings from Last 3 Encounters:  11/17/24 156 lb (70.8 kg)  10/19/24 154 lb (69.9 kg)  10/13/24 147 lb (66.7 kg)      Vital signs reviewed  11/17/2024  - Note at rest  02 sats  98% on RA   General appearance:    elderly pleasant bf / walks with 2 wheeled walker/ foley in place   HEENT : Oropharynx  clear  NECK :  without  apparent JVD/ palpable Nodes/TM    LUNGS: no acc muscle use,  Min barrel  contour chest wall with bilateral  slightly decreased bs s audible wheeze and  without cough on insp or exp maneuvers and min  Hyperresonant  to  percussion bilaterally    CV:  RRR  no s3 or murmur or increase in P2, and no edema   ABD:  soft and nontender    MS:  Nl gait/ ext warm without deformities Or obvious joint restrictions  calf tenderness, cyanosis or clubbing     SKIN: warm and dry without lesions    NEURO:  alert, approp, nl sensorium with  no motor or cerebellar deficits apparent.           Assessment    Assessment & Plan DOE (dyspnea on exertion) Quit smoking 2017 with doe that improved and did not require rx -  much worse since 04/2024 with reported multiple episodes of UTI/ pna > maint on macrodantin  intermittently over same period of time - no ILD on CTa from 10/13/24 and  no evidence of limiting airflow obst 10/19/2024 walking study on trelegy so try off trelegy which may interfere with bladder function - 11/17/2024 doing fine off all resp rx >   Comment: Very strong evidence this was mild macrodantin  pulmonary toxicity so advised avoid further use and f/u here prn   Nocturnal hypoxemia On 2lpm NP at HS and initial pulmonary eval 10/19/2024  - .10/19/2024   Walked on RA   approx 100  ft  @ very slow / walker pace, stopped due to tired with lowest 02 sats 96%  s sob    ->>>  11/17/2024 d/c 02 at pt's request and pulmonary fu is prn       Each maintenance medication was reviewed in detail including emphasizing most importantly the difference between maintenance and prns and under what circumstances the prns are to be triggered using an action plan format where appropriate.  Total time for H and P, chart review, counseling,  reviewing pulse ox  device(s) and generating customized AVS unique to this Wood visit / same day charting = 24 min summary f/u ov          AVS  Patient Instructions  No need for oxygen  at this point -  we will instruct [facility] to pick it up.  If any problems with night time breathing or sleeping, we can do an overnight oxygen  saturation study > call if needed   Make sure you check your oxygen  saturation at your highest level of activity(NOT after you stop)  to be sure it stays over 90% and keep track of it at least once a week, more often if breathing getting worse, and let me know if losing ground. (Collect the dots to connect the dots approach) .   Ozell America, MD 11/17/2024

## 2024-11-17 NOTE — Assessment & Plan Note (Addendum)
 Quit smoking 2017 with doe that improved and did not require rx -  much worse since 04/2024 with reported multiple episodes of UTI/ pna > maint on macrodantin  intermittently over same period of time - no ILD on CTa from 10/13/24 and  no evidence of limiting airflow obst 10/19/2024 walking study on trelegy so try off trelegy which may interfere with bladder function - 11/17/2024 doing fine off all resp rx >   Comment: Very strong evidence this was mild macrodantin  pulmonary toxicity so advised avoid further use and f/u here prn

## 2024-11-17 NOTE — Patient Instructions (Signed)
 No need for oxygen  at this point -  we will instruct [facility] to pick it up.  If any problems with night time breathing or sleeping, we can do an overnight oxygen  saturation study > call if needed   Make sure you check your oxygen  saturation at your highest level of activity(NOT after you stop)  to be sure it stays over 90% and keep track of it at least once a week, more often if breathing getting worse, and let me know if losing ground. (Collect the dots to connect the dots approach) .

## 2024-11-18 ENCOUNTER — Ambulatory Visit (HOSPITAL_COMMUNITY): Admission: RE | Admit: 2024-11-18 | Discharge: 2024-11-18 | Attending: Urology | Admitting: Urology

## 2024-11-18 ENCOUNTER — Encounter (HOSPITAL_COMMUNITY): Payer: Self-pay

## 2024-11-18 DIAGNOSIS — N1330 Unspecified hydronephrosis: Secondary | ICD-10-CM | POA: Diagnosis present

## 2024-11-18 MED ORDER — FUROSEMIDE 10 MG/ML IJ SOLN
INTRAMUSCULAR | Status: AC
  Filled 2024-11-18: qty 4

## 2024-11-18 MED ORDER — TECHNETIUM TC 99M MERTIATIDE
5.5000 | Freq: Once | INTRAVENOUS | Status: AC | PRN
  Administered 2024-11-18: 10:00:00 5.5 via INTRAVENOUS

## 2024-11-18 MED ORDER — FUROSEMIDE 10 MG/ML IJ SOLN
36.0000 mg | Freq: Once | INTRAMUSCULAR | Status: AC
  Administered 2024-11-18: 11:00:00 36 mg via INTRAVENOUS

## 2024-12-01 ENCOUNTER — Ambulatory Visit: Payer: Self-pay | Admitting: Urology

## 2024-12-01 ENCOUNTER — Telehealth: Payer: Self-pay

## 2024-12-01 ENCOUNTER — Ambulatory Visit (INDEPENDENT_AMBULATORY_CARE_PROVIDER_SITE_OTHER)

## 2024-12-01 DIAGNOSIS — N1339 Other hydronephrosis: Secondary | ICD-10-CM | POA: Diagnosis not present

## 2024-12-01 DIAGNOSIS — N952 Postmenopausal atrophic vaginitis: Secondary | ICD-10-CM

## 2024-12-01 DIAGNOSIS — R339 Retention of urine, unspecified: Secondary | ICD-10-CM | POA: Diagnosis not present

## 2024-12-01 DIAGNOSIS — Z87440 Personal history of urinary (tract) infections: Secondary | ICD-10-CM

## 2024-12-01 DIAGNOSIS — N1330 Unspecified hydronephrosis: Secondary | ICD-10-CM

## 2024-12-01 MED ORDER — ESTRADIOL 0.01 % VA CREA: TOPICAL_CREAM | VAGINAL | 3 refills | Status: AC

## 2024-12-01 MED ORDER — CIPROFLOXACIN HCL 500 MG PO TABS
500.0000 mg | ORAL_TABLET | Freq: Once | ORAL | Status: AC
  Administered 2024-12-01: 500 mg via ORAL

## 2024-12-01 NOTE — Progress Notes (Signed)
 Cath Change/ Replacement  Patient is present today for a catheter change due to urinary retention.  10 ml of water  was removed from the balloon, a 16 FR foley cath was removed without difficulty.  Patient was cleaned and prepped in a sterile fashion with Betadinex3.  A 16  FR foley cath was replaced into the bladder, no complications were noted. Urine return was noted 20 ml and urine was Clear yellow in color. The balloon was filled with 10ml of sterile water . A night bag was attached for drainage.  A night bag was also given to the patient and patient was given instruction on how to change from one bag to another. Patient was given proper instruction on catheter care.    Performed by: Carlos, CMA  Follow up: 4 weeks cath change

## 2024-12-01 NOTE — Progress Notes (Signed)
 "  Assessment: 1.  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  This may actually be longstanding based on what looks like extrarenal pelvis on prior films.  Verification of obstruction made by Lasix  renogram  2.  Recurrent urinary tract infections, most recent urine culture from the 22nd of last month negative  3.  Incomplete bladder emptying.  Currently being treated with Foley catheter was changed on November 22     Plan: 1.  Cath was changed today  2.  Use Estrace cream and perivaginal area twice a day-sent in  3.  She needs cystoscopy and right double-J stent.  Will work on setting that up here in [place]   History of Present Illness:  11.11.2025: Gabrielle Wood is a 78 y.o. female who is seen in consultation from Rosamond Mon B, MD for evaluation of current urinary tract infections and radiographic evidence of a UPJ obstruction.  Admitted to the hospital on the 28th of last month with sepsis.  She has multiple medical issues.  CT showed right hydronephrosis to the UPJ.  Urine cultures revealed multiple organisms.  Blood cultures were negative.  She comes in today with her son Garrel.  He states that she has had multiple urinary tract infections.  She now has a catheter, as she was having high residual urine volumes.  She is quite weak, has difficulty walking.  Physical therapy is eventually planned.  12.2.2025: Renal ultrasound from November 14 revealed findings consistent with right hydronephrosis/UPJ obstruction.  Her catheter was replaced on the 22nd of last month as it was not draining appropriately.  12.30.2025: Recent Lasix  renogram revealed true obstruction of the right renal unit.  She is here today for catheter change.  Complaining of vaginal irritation/pain.  Does not really use any cream   Past Medical History:  Past Medical History:  Diagnosis Date   Age-related physical debility 11/17/2021   AKI (acute kidney injury) 09/29/2024   Anxiety 05/28/2023    Arthritis    CAP (community acquired pneumonia) 10/14/2021   Chronic hypoxic respiratory failure (HCC) 06/23/2024   Constipation 06/26/2024   COPD (chronic obstructive pulmonary disease) (HCC)    COPD exacerbation (HCC) 05/27/2024   Diabetes mellitus    DKA (diabetic ketoacidosis) (HCC) 10/14/2021   DVT (deep venous thrombosis) (HCC)    years ago   Failure to thrive in adult 10/07/2024   Gastritis and duodenitis 04/05/2012   GERD (gastroesophageal reflux disease)    H. 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  Pathology with chronic H. pylori gastritis.   EUS  05/14/2012   Normal pancreas, gallbladder,biliary tree/ Incidental finding of simple-appearing  right renal cyst.   TONSILLECTOMY     TUBAL LIGATION     VEIN SURGERY     stripping years ago but recent laser surgery; bilateral legs    Allergies:  Allergies  Allergen Reactions   Amlodipine  Other (See Comments)    Unknown    Ciprocin-Fluocin-Procin [Fluocinolone] Other (See Comments)    Nightmares   Lipitor [Atorvastatin ] Other (See Comments)    Muscle aches, pain   Nebivolol Other (See  Comments)    Unknown    Nitrofuran Derivatives Other (See Comments)    Unknown    Lisinopril Cough   Red Yeast Rice [Cholestin] Itching and Rash    Family History:  Family History  Problem Relation Age of Onset   Pancreatic cancer Mother        diagnosed at age 33s, died from heart attack   Inflammatory bowel disease Brother        crohn's, his dgt deceased from colon cancer at age 62   Colon cancer Neg Hx     Social History:  Social History   Tobacco Use   Smoking status: Former    Current packs/day: 1.00    Average packs/day: 1 pack/day for 55.1 years (55.1 ttl pk-yrs)    Types: Cigarettes    Start date: 10/19/1969   Smokeless tobacco: Never   Tobacco comments:    Quit in 2017  Vaping Use   Vaping status: Never Used  Substance Use Topics   Alcohol  use: No   Drug use: No   Physical exam: Atrophic vaginal changes.  No significant discharge. Results:  I have reviewed referring/prior physicians records--both recent hospitalizations reviewed  I have reviewed urinalysis results from November 22--21-50 white blood cells, rare bacteria.  Culture revealed insignificant growth of bacteria.  I reviewed prior imaging studies--renal ultrasound images from November 14.  Lasix  renogram reviewed  "

## 2024-12-01 NOTE — Telephone Encounter (Signed)
 Patient came in today for monthly cath changed. Pt is requesting cath to be change every 20 days due to patient is experiencing pain with cath. Patient only consume about 2 bottles of water  a day along with about 3 electronic drinks. Pt is advised to increase water  intake to help with pain from cath. Pt also state's she is itchy and she wants to crawl her skin. Pt is aware a message will be sent to MD on advisement.

## 2024-12-07 ENCOUNTER — Emergency Department (HOSPITAL_COMMUNITY)

## 2024-12-07 ENCOUNTER — Emergency Department (HOSPITAL_COMMUNITY)
Admission: EM | Admit: 2024-12-07 | Discharge: 2024-12-07 | Disposition: A | Discharge status: Home / Self Care | Attending: Emergency Medicine | Admitting: Emergency Medicine

## 2024-12-07 ENCOUNTER — Encounter (HOSPITAL_COMMUNITY): Payer: Self-pay

## 2024-12-07 DIAGNOSIS — R531 Weakness: Secondary | ICD-10-CM | POA: Insufficient documentation

## 2024-12-07 DIAGNOSIS — Z466 Encounter for fitting and adjustment of urinary device: Secondary | ICD-10-CM | POA: Diagnosis not present

## 2024-12-07 DIAGNOSIS — N3001 Acute cystitis with hematuria: Secondary | ICD-10-CM | POA: Diagnosis not present

## 2024-12-07 DIAGNOSIS — R0602 Shortness of breath: Secondary | ICD-10-CM | POA: Insufficient documentation

## 2024-12-07 DIAGNOSIS — N390 Urinary tract infection, site not specified: Secondary | ICD-10-CM | POA: Diagnosis present

## 2024-12-07 LAB — COMPREHENSIVE METABOLIC PANEL WITH GFR
ALT: 13 U/L (ref 0–44)
AST: 20 U/L (ref 15–41)
Albumin: 3.9 g/dL (ref 3.5–5.0)
Alkaline Phosphatase: 70 U/L (ref 38–126)
Anion gap: 15 (ref 5–15)
BUN: 13 mg/dL (ref 8–23)
CO2: 19 mmol/L — ABNORMAL LOW (ref 22–32)
Calcium: 9.9 mg/dL (ref 8.9–10.3)
Chloride: 102 mmol/L (ref 98–111)
Creatinine, Ser: 0.97 mg/dL (ref 0.44–1.00)
GFR, Estimated: 59 mL/min — ABNORMAL LOW
Glucose, Bld: 117 mg/dL — ABNORMAL HIGH (ref 70–99)
Potassium: 4.1 mmol/L (ref 3.5–5.1)
Sodium: 135 mmol/L (ref 135–145)
Total Bilirubin: 0.3 mg/dL (ref 0.0–1.2)
Total Protein: 7.5 g/dL (ref 6.5–8.1)

## 2024-12-07 LAB — CBC WITH DIFFERENTIAL/PLATELET
Abs Immature Granulocytes: 0.02 K/uL (ref 0.00–0.07)
Basophils Absolute: 0 K/uL (ref 0.0–0.1)
Basophils Relative: 1 %
Eosinophils Absolute: 0 K/uL (ref 0.0–0.5)
Eosinophils Relative: 1 %
HCT: 34.5 % — ABNORMAL LOW (ref 36.0–46.0)
Hemoglobin: 10.8 g/dL — ABNORMAL LOW (ref 12.0–15.0)
Immature Granulocytes: 0 %
Lymphocytes Relative: 22 %
Lymphs Abs: 1.3 K/uL (ref 0.7–4.0)
MCH: 26 pg (ref 26.0–34.0)
MCHC: 31.3 g/dL (ref 30.0–36.0)
MCV: 82.9 fL (ref 80.0–100.0)
Monocytes Absolute: 1 K/uL (ref 0.1–1.0)
Monocytes Relative: 17 %
Neutro Abs: 3.7 K/uL (ref 1.7–7.7)
Neutrophils Relative %: 59 %
Platelets: 239 K/uL (ref 150–400)
RBC: 4.16 MIL/uL (ref 3.87–5.11)
RDW: 16.9 % — ABNORMAL HIGH (ref 11.5–15.5)
WBC: 6.1 K/uL (ref 4.0–10.5)
nRBC: 0 % (ref 0.0–0.2)

## 2024-12-07 LAB — LACTIC ACID, PLASMA: Lactic Acid, Venous: 1.9 mmol/L (ref 0.5–1.9)

## 2024-12-07 LAB — URINALYSIS, W/ REFLEX TO CULTURE (INFECTION SUSPECTED)
Bilirubin Urine: NEGATIVE
Glucose, UA: NEGATIVE mg/dL
Hgb urine dipstick: NEGATIVE
Ketones, ur: NEGATIVE mg/dL
Nitrite: NEGATIVE
Protein, ur: 30 mg/dL — AB
RBC / HPF: >50 RBC/hpf (ref 0–5)
Specific Gravity, Urine: 1.016 (ref 1.005–1.030)
WBC, UA: >50 WBC/hpf (ref 0–5)
pH: 5 (ref 5.0–8.0)

## 2024-12-07 LAB — RESP PANEL BY RT-PCR (RSV, FLU A&B, COVID)  RVPGX2
Influenza A by PCR: NEGATIVE
Influenza B by PCR: NEGATIVE
Resp Syncytial Virus by PCR: NEGATIVE
SARS Coronavirus 2 by RT PCR: NEGATIVE

## 2024-12-07 LAB — PROTIME-INR
INR: 1 (ref 0.8–1.2)
Prothrombin Time: 14.2 s (ref 11.4–15.2)

## 2024-12-07 MED ORDER — SODIUM CHLORIDE 0.9 % IV SOLN
2.0000 g | Freq: Once | INTRAVENOUS | Status: AC
  Administered 2024-12-07: 2 g via INTRAVENOUS
  Filled 2024-12-07: qty 20

## 2024-12-07 MED ORDER — CEPHALEXIN 500 MG PO CAPS: 500.0000 mg | ORAL_CAPSULE | Freq: Four times a day (QID) | ORAL | 0 refills | Status: DC

## 2024-12-07 NOTE — Discharge Instructions (Signed)
Follow-up with your doctor either the end of this week or beginning of next week

## 2024-12-07 NOTE — ED Provider Notes (Signed)
 " [place] EMERGENCY DEPARTMENT AT [facility] Provider Note   CSN: [id number] Arrival date & time: 12/07/24  1018     Patient presents with: Foley Catheter Issue and Fatigue   Gabrielle Wood is a 79 y.o. female.   Patient complains of leaking around her Foley catheter and fatigue  The history is provided by the patient and medical records. No language interpreter was used.  Weakness Severity:  Mild Onset quality:  Sudden Timing:  Intermittent Progression:  Waxing and waning Chronicity:  New Context: not alcohol  use   Relieved by:  Nothing Worsened by:  Nothing Associated symptoms: no abdominal pain, no chest pain, no cough, no diarrhea, no frequency, no headaches and no seizures        Prior to Admission medications  Medication Sig Start Date End Date Taking? Authorizing Provider  cephALEXin  (KEFLEX ) 500 MG capsule Take 1 capsule (500 mg total) by mouth 4 (four) times daily. 12/07/24  Yes Suzette Pac, MD  acetaminophen  (TYLENOL ) 500 MG tablet Take 1,000 mg by mouth in the morning and at bedtime.    [provider]  albuterol  (VENTOLIN  HFA) 108 (90 Base) MCG/ACT inhaler Inhale 1-2 puffs into the lungs every 4 (four) hours as needed for wheezing or shortness of breath. 09/30/23   Ula Prentice SAUNDERS, MD  ALPRAZolam  (XANAX ) 1 MG tablet Take 0.5 tablets (0.5 mg total) by mouth at bedtime. 0.5 tablet in am and 0.5 tab at night 08/19/24   Cheryle Page, MD  aspirin  EC 81 MG tablet Take 1 tablet (81 mg total) by mouth daily. Swallow whole. 10/02/24   Mcarthur Pick, MD  atorvastatin  (LIPITOR) 80 MG tablet Take 80 mg by mouth daily. 11/24/21   [provider]  baclofen (LIORESAL) 10 MG tablet Take 10 mg by mouth 2 (two) times daily as needed for muscle spasms. 08/06/24   [provider]  BRILINTA  60 MG TABS tablet Take 60 mg by mouth 2 (two) times daily. 09/07/24   [provider]  Continuous Glucose Sensor (FREESTYLE LIBRE 3 PLUS SENSOR) MISC  Change sensor every 15 days. 09/16/24   Therisa Benton JINNY, NP  estradiol  (ESTRACE ) 0.01 % CREA vaginal cream Apply a pea-sized amount to vaginal opening using index finger 2-3 nights/week 12/01/24   Matilda Senior, MD  gabapentin  (NEURONTIN ) 100 MG capsule Take 1 capsule (100 mg total) by mouth 3 (three) times daily. 08/19/24   Cheryle Page, MD  insulin  degludec (TRESIBA  FLEXTOUCH) 100 UNIT/ML FlexTouch Pen Inject 26 Units into the skin at bedtime. 09/16/24   Therisa Benton JINNY, NP  insulin  lispro (HUMALOG  KWIKPEN) 100 UNIT/ML KwikPen Inject 8-14 Units into the skin 3 (three) times daily. 09/16/24   Therisa Benton JINNY, NP  levothyroxine  (SYNTHROID ) 175 MCG tablet Take 175 mcg by mouth daily. 10/08/24   [provider]  magnesium  oxide (MAG-OX) 400 (240 Mg) MG tablet Take 400 mg by mouth daily.    [provider]  megestrol  (MEGACE ) 400 MG/10ML suspension Take 10 mLs (400 mg total) by mouth daily. 10/11/24   Shahmehdi, Adriana LABOR, MD  metoprolol  succinate (TOPROL -XL) 25 MG 24 hr tablet Take 25 mg by mouth daily.    [provider]  nitroGLYCERIN  (NITROSTAT ) 0.4 MG SL tablet Place 0.4 mg under the tongue every 5 (five) minutes as needed for chest pain.    [provider]  OXYGEN  Inhale 2 L/min into the lungs at bedtime.    [provider]  pantoprazole  (PROTONIX ) 40 MG tablet  Take 40 mg by mouth daily. 06/06/21   [provider]  senna-docusate (SENOKOT-S) 8.6-50 MG tablet Take 1 tablet by mouth at bedtime as needed for mild constipation. 10/02/24   Sigdel, Santosh, MD  fexofenadine (ALLEGRA) 60 MG tablet Take by mouth.  04/13/21  [provider]    Allergies: Amlodipine , Ciprocin-fluocin-procin [fluocinolone], Lipitor [atorvastatin ], Nebivolol, Nitrofuran derivatives, Lisinopril, and Red yeast rice [cholestin]    Review of Systems  Constitutional:  Negative for appetite change and fatigue.  HENT:  Negative for congestion, ear discharge  and sinus pressure.   Eyes:  Negative for discharge.  Respiratory:  Negative for cough.   Cardiovascular:  Negative for chest pain.  Gastrointestinal:  Negative for abdominal pain and diarrhea.  Genitourinary:  Negative for frequency and hematuria.  Musculoskeletal:  Negative for back pain.  Skin:  Negative for rash.  Neurological:  Positive for weakness. Negative for seizures and headaches.  Psychiatric/Behavioral:  Negative for hallucinations.     Updated Vital Signs BP 120/80   Pulse 71   Temp 98 F (36.7 C)   Resp (!) 22   Ht 5' 7 (1.702 m)   Wt 70.8 kg   SpO2 99%   BMI 24.43 kg/m   Physical Exam Vitals and nursing note reviewed.  Constitutional:      Appearance: She is well-developed.  HENT:     Head: Normocephalic.     Nose: Nose normal.  Eyes:     General: No scleral icterus.    Conjunctiva/sclera: Conjunctivae normal.  Neck:     Thyroid : No thyromegaly.  Cardiovascular:     Rate and Rhythm: Normal rate and regular rhythm.     Heart sounds: No murmur heard.    No friction rub. No gallop.  Pulmonary:     Breath sounds: No stridor. No wheezing or rales.  Chest:     Chest wall: No tenderness.  Abdominal:     General: There is no distension.     Tenderness: There is no abdominal tenderness. There is no rebound.  Genitourinary:    Comments: Foley catheter in place Musculoskeletal:        General: Normal range of motion.     Cervical back: Neck supple.  Lymphadenopathy:     Cervical: No cervical adenopathy.  Skin:    Findings: No erythema or rash.  Neurological:     Mental Status: She is alert and oriented to person, place, and time.     Motor: No abnormal muscle tone.     Coordination: Coordination normal.  Psychiatric:        Behavior: Behavior normal.     (all labs ordered are listed, but only abnormal results are displayed) Labs Reviewed  COMPREHENSIVE METABOLIC PANEL WITH GFR - Abnormal; Notable for the following components:      Result Value    CO2 19 (*)    Glucose, Bld 117 (*)    GFR, Estimated 59 (*)    All other components within normal limits  CBC WITH DIFFERENTIAL/PLATELET - Abnormal; Notable for the following components:   Hemoglobin 10.8 (*)    HCT 34.5 (*)    RDW 16.9 (*)    All other components within normal limits  URINALYSIS, W/ REFLEX TO CULTURE (INFECTION SUSPECTED) - Abnormal; Notable for the following components:   Color, Urine AMBER (*)    APPearance CLOUDY (*)    Protein, ur 30 (*)    Leukocytes,Ua LARGE (*)    Bacteria, UA RARE (*)  All other components within normal limits  RESP PANEL BY RT-PCR (RSV, FLU A&B, COVID)  RVPGX2  CULTURE, BLOOD (ROUTINE X 2)  CULTURE, BLOOD (ROUTINE X 2)  URINE CULTURE  LACTIC ACID, PLASMA  PROTIME-INR    EKG: None  Radiology: DG Chest Port 1 View Result Date: 12/07/2024 EXAM: 1 VIEW(S) XRAY OF THE CHEST 12/07/2024 11:59:00 AM COMPARISON: 10/13/2024 CLINICAL HISTORY: sob FINDINGS: LUNGS AND PLEURA: Hazy opacity along left lateral costophrenic angle likely represents atelectasis and/or small pleural effusion. No pneumothorax. HEART AND MEDIASTINUM: Atherosclerotic plaque. No acute abnormality of the cardiac and mediastinal silhouettes. BONES AND SOFT TISSUES: No acute osseous abnormality. IMPRESSION: 1. Hazy opacity along left lateral costophrenic angle, likely representing atelectasis and/or small pleural effusion. 2. Atherosclerotic plaque. Electronically signed by: Evalene Coho MD 12/07/2024 12:57 PM EST RP Workstation: [id number]     Procedures   Medications Ordered in the ED  cefTRIAXone  (ROCEPHIN ) 2 g in sodium chloride  0.9 % 100 mL IVPB (0 g Intravenous Stopped 12/07/24 1335)                                    Medical Decision Making Amount and/or Complexity of Data Reviewed Labs: ordered. Radiology: ordered.  Risk Prescription drug management.   Patient with UTI.  She is placed on Keflex .  Also patient had Foley catheter replaced     Final  diagnoses:  Acute cystitis with hematuria    ED Discharge Orders          Ordered    cephALEXin  (KEFLEX ) 500 MG capsule  4 times daily        12/07/24 1344               Suzette Pac, MD 12/08/24 1651  "

## 2024-12-07 NOTE — ED Triage Notes (Addendum)
 Pt reports having urine leaking around her foley catheter starting last night and headache and fatigue x several days.  Pt report there is still urine in the bag as well.    Pt had catheter placed 12/30 and has it changed every 30 days.

## 2024-12-10 LAB — URINE CULTURE: Culture: >=100000 — AB

## 2024-12-11 ENCOUNTER — Telehealth (HOSPITAL_BASED_OUTPATIENT_CLINIC_OR_DEPARTMENT_OTHER): Payer: Self-pay

## 2024-12-11 ENCOUNTER — Telehealth: Payer: Self-pay

## 2024-12-11 NOTE — Telephone Encounter (Signed)
 Post ED Visit - Positive Culture Follow-up: Unsuccessful Patient Follow-up  Culture assessed and recommendations reviewed by:  []  Rankin Dee, Pharm.D. []  Venetia Gully, Pharm.D., BCPS AQ-ID []  Garrel Crews, Pharm.D., BCPS []  Almarie Lunger, [address].D., BCPS []  [employer], [place].D., BCPS, AAHIVP []  Rosaline Bihari, Pharm.D., BCPS, AAHIVP []  Massie Rigg, PharmD []  Jodie Rower, PharmD, BCPS  Positive urine culture  []  Patient discharged without antimicrobial prescription and treatment is now indicated [x]  Organism is resistant to prescribed ED discharge antimicrobial []  Patient with positive blood cultures  Plan d/c Cephalexin . if no s/s no abx needed. if having s/s start Amoxicillin  Clavulanate 800/160 mg po BID x 3 days per ED provider Elsie Body, MD   Unable to contact patient after 3 attempts, letter will be sent to address on file  Gabrielle Wood 12/11/2024, 9:17 AM

## 2024-12-11 NOTE — Telephone Encounter (Signed)
 I spoke with son regarding surgery.  Communication documented in surgery workque

## 2024-12-11 NOTE — Telephone Encounter (Signed)
 Leah with [facility] is calling to speak with clinical to discuss patient care.   Call:  [phone]

## 2024-12-12 LAB — CULTURE, BLOOD (ROUTINE X 2)
Culture: NO GROWTH
Culture: NO GROWTH
Special Requests: ADEQUATE
Special Requests: ADEQUATE

## 2024-12-15 ENCOUNTER — Observation Stay (HOSPITAL_COMMUNITY)
Admission: EM | Admit: 2024-12-15 | Discharge: 2024-12-17 | Disposition: A | Discharge status: Home / Self Care | Attending: Internal Medicine | Admitting: Internal Medicine

## 2024-12-15 ENCOUNTER — Other Ambulatory Visit: Payer: Self-pay

## 2024-12-15 ENCOUNTER — Encounter (HOSPITAL_COMMUNITY): Payer: Self-pay

## 2024-12-15 DIAGNOSIS — T83511A Infection and inflammatory reaction due to indwelling urethral catheter, initial encounter: Secondary | ICD-10-CM | POA: Diagnosis not present

## 2024-12-15 DIAGNOSIS — E119 Type 2 diabetes mellitus without complications: Secondary | ICD-10-CM | POA: Diagnosis not present

## 2024-12-15 DIAGNOSIS — E039 Hypothyroidism, unspecified: Secondary | ICD-10-CM | POA: Insufficient documentation

## 2024-12-15 DIAGNOSIS — K219 Gastro-esophageal reflux disease without esophagitis: Secondary | ICD-10-CM | POA: Diagnosis not present

## 2024-12-15 DIAGNOSIS — Z955 Presence of coronary angioplasty implant and graft: Secondary | ICD-10-CM | POA: Diagnosis not present

## 2024-12-15 DIAGNOSIS — E1165 Type 2 diabetes mellitus with hyperglycemia: Secondary | ICD-10-CM | POA: Insufficient documentation

## 2024-12-15 DIAGNOSIS — I10 Essential (primary) hypertension: Secondary | ICD-10-CM | POA: Diagnosis not present

## 2024-12-15 DIAGNOSIS — J449 Chronic obstructive pulmonary disease, unspecified: Secondary | ICD-10-CM | POA: Diagnosis not present

## 2024-12-15 DIAGNOSIS — Z8719 Personal history of other diseases of the digestive system: Secondary | ICD-10-CM | POA: Insufficient documentation

## 2024-12-15 DIAGNOSIS — Z87891 Personal history of nicotine dependence: Secondary | ICD-10-CM | POA: Insufficient documentation

## 2024-12-15 DIAGNOSIS — Z7982 Long term (current) use of aspirin: Secondary | ICD-10-CM | POA: Insufficient documentation

## 2024-12-15 DIAGNOSIS — E785 Hyperlipidemia, unspecified: Secondary | ICD-10-CM | POA: Insufficient documentation

## 2024-12-15 DIAGNOSIS — Z79899 Other long term (current) drug therapy: Secondary | ICD-10-CM | POA: Diagnosis not present

## 2024-12-15 DIAGNOSIS — E860 Dehydration: Secondary | ICD-10-CM | POA: Diagnosis present

## 2024-12-15 DIAGNOSIS — I2510 Atherosclerotic heart disease of native coronary artery without angina pectoris: Secondary | ICD-10-CM | POA: Diagnosis not present

## 2024-12-15 DIAGNOSIS — N390 Urinary tract infection, site not specified: Secondary | ICD-10-CM | POA: Diagnosis not present

## 2024-12-15 LAB — URINALYSIS, ROUTINE W REFLEX MICROSCOPIC
Bilirubin Urine: NEGATIVE
Glucose, UA: >=500 mg/dL — AB
Ketones, ur: NEGATIVE mg/dL
Nitrite: NEGATIVE
Protein, ur: 30 mg/dL — AB
Specific Gravity, Urine: 1.015 (ref 1.005–1.030)
WBC, UA: >50 WBC/hpf (ref 0–5)
pH: 6 (ref 5.0–8.0)

## 2024-12-15 LAB — COMPREHENSIVE METABOLIC PANEL WITH GFR
ALT: 13 U/L (ref 0–44)
AST: 16 U/L (ref 15–41)
Albumin: 4.2 g/dL (ref 3.5–5.0)
Alkaline Phosphatase: 73 U/L (ref 38–126)
Anion gap: 15 (ref 5–15)
BUN: 16 mg/dL (ref 8–23)
CO2: 22 mmol/L (ref 22–32)
Calcium: 10.1 mg/dL (ref 8.9–10.3)
Chloride: 99 mmol/L (ref 98–111)
Creatinine, Ser: 1.06 mg/dL — ABNORMAL HIGH (ref 0.44–1.00)
GFR, Estimated: 53 mL/min — ABNORMAL LOW
Glucose, Bld: 298 mg/dL — ABNORMAL HIGH (ref 70–99)
Potassium: 4.1 mmol/L (ref 3.5–5.1)
Sodium: 136 mmol/L (ref 135–145)
Total Bilirubin: 0.3 mg/dL (ref 0.0–1.2)
Total Protein: 7.7 g/dL (ref 6.5–8.1)

## 2024-12-15 LAB — GLUCOSE, CAPILLARY: Glucose-Capillary: 427 mg/dL — ABNORMAL HIGH (ref 70–99)

## 2024-12-15 LAB — CBC WITH DIFFERENTIAL/PLATELET
Abs Immature Granulocytes: 0.02 K/uL (ref 0.00–0.07)
Basophils Absolute: 0 K/uL (ref 0.0–0.1)
Basophils Relative: 1 %
Eosinophils Absolute: 0.1 K/uL (ref 0.0–0.5)
Eosinophils Relative: 2 %
HCT: 35.4 % — ABNORMAL LOW (ref 36.0–46.0)
Hemoglobin: 11.1 g/dL — ABNORMAL LOW (ref 12.0–15.0)
Immature Granulocytes: 0 %
Lymphocytes Relative: 30 %
Lymphs Abs: 1.8 K/uL (ref 0.7–4.0)
MCH: 25.7 pg — ABNORMAL LOW (ref 26.0–34.0)
MCHC: 31.4 g/dL (ref 30.0–36.0)
MCV: 81.9 fL (ref 80.0–100.0)
Monocytes Absolute: 0.8 K/uL (ref 0.1–1.0)
Monocytes Relative: 13 %
Neutro Abs: 3.3 K/uL (ref 1.7–7.7)
Neutrophils Relative %: 54 %
Platelets: 292 K/uL (ref 150–400)
RBC: 4.32 MIL/uL (ref 3.87–5.11)
RDW: 16.3 % — ABNORMAL HIGH (ref 11.5–15.5)
WBC: 6 K/uL (ref 4.0–10.5)
nRBC: 0 % (ref 0.0–0.2)

## 2024-12-15 LAB — LIPASE, BLOOD: Lipase: 24 U/L (ref 11–51)

## 2024-12-15 MED ORDER — INSULIN ASPART 100 UNIT/ML IJ SOLN
0.0000 [IU] | Freq: Every day | INTRAMUSCULAR | Status: DC
  Administered 2024-12-16: 2 [IU] via SUBCUTANEOUS
  Filled 2024-12-15: qty 1

## 2024-12-15 MED ORDER — ALPRAZOLAM 0.5 MG PO TABS
0.5000 mg | ORAL_TABLET | Freq: Every day | ORAL | Status: DC
  Administered 2024-12-15 – 2024-12-16 (×2): 0.5 mg via ORAL
  Filled 2024-12-15 (×2): qty 1

## 2024-12-15 MED ORDER — INSULIN GLARGINE 100 UNITS/ML SOLOSTAR PEN: 15.0000 [IU] | PEN_INJECTOR | Freq: Every day | SUBCUTANEOUS | Status: DC

## 2024-12-15 MED ORDER — LEVOTHYROXINE SODIUM 50 MCG PO TABS
175.0000 ug | ORAL_TABLET | Freq: Every day | ORAL | Status: DC
  Administered 2024-12-16 – 2024-12-17 (×2): 175 ug via ORAL
  Filled 2024-12-15 (×2): qty 1

## 2024-12-15 MED ORDER — AMPICILLIN SODIUM 1 G IJ SOLR
1.0000 g | Freq: Once | INTRAMUSCULAR | Status: AC
  Administered 2024-12-15: 1 g via INTRAVENOUS
  Filled 2024-12-15: qty 1000

## 2024-12-15 MED ORDER — PANTOPRAZOLE SODIUM 40 MG PO TBEC
40.0000 mg | DELAYED_RELEASE_TABLET | Freq: Every day | ORAL | Status: DC
  Administered 2024-12-16 – 2024-12-17 (×2): 40 mg via ORAL
  Filled 2024-12-15 (×2): qty 1

## 2024-12-15 MED ORDER — ACETAMINOPHEN 325 MG PO TABS
650.0000 mg | ORAL_TABLET | Freq: Four times a day (QID) | ORAL | Status: DC | PRN
  Administered 2024-12-15 – 2024-12-16 (×2): 650 mg via ORAL
  Filled 2024-12-15 (×2): qty 2

## 2024-12-15 MED ORDER — BACLOFEN 10 MG PO TABS
10.0000 mg | ORAL_TABLET | Freq: Two times a day (BID) | ORAL | Status: DC | PRN
  Administered 2024-12-15 – 2024-12-16 (×3): 10 mg via ORAL
  Filled 2024-12-15 (×3): qty 1

## 2024-12-15 MED ORDER — IPRATROPIUM-ALBUTEROL 0.5-2.5 (3) MG/3ML IN SOLN: 3.0000 mL | RESPIRATORY_TRACT | Status: DC | PRN

## 2024-12-15 MED ORDER — SODIUM CHLORIDE 0.9 % IV BOLUS
1000.0000 mL | Freq: Once | INTRAVENOUS | Status: AC
  Administered 2024-12-15: 1000 mL via INTRAVENOUS

## 2024-12-15 MED ORDER — GABAPENTIN 100 MG PO CAPS
100.0000 mg | ORAL_CAPSULE | Freq: Three times a day (TID) | ORAL | Status: DC
  Administered 2024-12-15 – 2024-12-17 (×5): 100 mg via ORAL
  Filled 2024-12-15 (×5): qty 1

## 2024-12-15 MED ORDER — INSULIN GLARGINE-YFGN 100 UNIT/ML ~~LOC~~ SOLN
15.0000 [IU] | Freq: Every day | SUBCUTANEOUS | Status: DC
  Administered 2024-12-16: 15 [IU] via SUBCUTANEOUS
  Filled 2024-12-15 (×2): qty 0.15

## 2024-12-15 MED ORDER — ATORVASTATIN CALCIUM 40 MG PO TABS
80.0000 mg | ORAL_TABLET | Freq: Every day | ORAL | Status: DC
  Administered 2024-12-16 – 2024-12-17 (×2): 80 mg via ORAL
  Filled 2024-12-15 (×2): qty 2

## 2024-12-15 MED ORDER — ENOXAPARIN SODIUM 40 MG/0.4ML IJ SOSY
40.0000 mg | PREFILLED_SYRINGE | INTRAMUSCULAR | Status: DC
  Administered 2024-12-15 – 2024-12-16 (×2): 40 mg via SUBCUTANEOUS
  Filled 2024-12-15 (×2): qty 0.4

## 2024-12-15 MED ORDER — INSULIN ASPART 100 UNIT/ML IJ SOLN
0.0000 [IU] | Freq: Three times a day (TID) | INTRAMUSCULAR | Status: DC
  Administered 2024-12-16 (×2): 5 [IU] via SUBCUTANEOUS
  Administered 2024-12-17 (×2): 3 [IU] via SUBCUTANEOUS
  Filled 2024-12-15 (×4): qty 1

## 2024-12-15 MED ORDER — SODIUM CHLORIDE 0.9 % IV SOLN
1.0000 g | Freq: Four times a day (QID) | INTRAVENOUS | Status: DC
  Administered 2024-12-16 – 2024-12-17 (×6): 1 g via INTRAVENOUS
  Filled 2024-12-15 (×12): qty 1000

## 2024-12-15 MED ORDER — TICAGRELOR 60 MG PO TABS
60.0000 mg | ORAL_TABLET | Freq: Two times a day (BID) | ORAL | Status: DC
  Administered 2024-12-16 – 2024-12-17 (×4): 60 mg via ORAL
  Filled 2024-12-15 (×7): qty 1

## 2024-12-15 MED ORDER — ASPIRIN 81 MG PO TBEC
81.0000 mg | DELAYED_RELEASE_TABLET | Freq: Every day | ORAL | Status: DC
  Administered 2024-12-16 – 2024-12-17 (×2): 81 mg via ORAL
  Filled 2024-12-15 (×2): qty 1

## 2024-12-15 MED ORDER — SENNOSIDES-DOCUSATE SODIUM 8.6-50 MG PO TABS: 1.0000 | ORAL_TABLET | Freq: Every evening | ORAL | Status: DC | PRN

## 2024-12-15 MED ORDER — INSULIN ASPART 100 UNIT/ML IJ SOLN
6.0000 [IU] | Freq: Once | INTRAMUSCULAR | Status: AC
  Administered 2024-12-15: 6 [IU] via SUBCUTANEOUS
  Filled 2024-12-15: qty 1

## 2024-12-15 MED ORDER — METOPROLOL SUCCINATE ER 25 MG PO TB24
25.0000 mg | ORAL_TABLET | Freq: Every day | ORAL | Status: DC
  Administered 2024-12-15 – 2024-12-17 (×3): 25 mg via ORAL
  Filled 2024-12-15 (×3): qty 1

## 2024-12-15 NOTE — H&P (Signed)
 " History and Physical    Patient: Gabrielle Wood FMW:[id number] DOB: 03-Sep-1946 DOA: 12/15/2024 DOS: the patient was seen and examined on 12/15/2024 PCP: [name], MD  Patient coming from: Home  Chief Complaint: Generalized weakness and lower abdominal pain Chief Complaint  Patient presents with   Dehydration   HPI: Gabrielle Wood is a 79 y.o. 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  Patient was recently seen in the emergency room here on 12/07/2024 after she presented with generalized weakness and leakage of urine around the Foley catheter.  Urinalysis around that time showed findings of UTI and urine culture was taken.  Patient was discharged home around that time on Keflex  which she has been taking with no improvement in symptoms.  Urine culture results showing VRE.  Patient came back today because she was still having persistent weakness and now with associated lower abdominal pain.  Denies fever, nausea vomiting, chest pain cough or hematuria.  ED course: Upon arrival to the emergency room patient had vitals showing temperature 97.9, respiratory rate 16, pulse 92 with blood pressure 122/76 saturating 98% on room air. Urinalysis showed findings suggestive for UTI, recent urine culture showing VRE.  Given above concerns hospitalist service was contacted to admit patient for further management  Review of Systems: As mentioned in the history of present illness. All other systems reviewed and are negative. Past Medical History:  Diagnosis Date   Age-related physical debility 11/17/2021   AKI (acute kidney injury) 09/29/2024   Anxiety 05/28/2023   Arthritis    CAP (community acquired  pneumonia) 10/14/2021   Chronic hypoxic respiratory failure (HCC) 06/23/2024   Constipation 06/26/2024   COPD (chronic obstructive pulmonary disease) (HCC)    COPD exacerbation (HCC) 05/27/2024   Diabetes mellitus    DKA (diabetic ketoacidosis) (HCC) 10/14/2021   DVT (deep venous thrombosis) (HCC)    years ago   Failure to thrive in adult 10/07/2024   Gastritis and duodenitis 04/05/2012   GERD (gastroesophageal reflux disease)    H. 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  Pathology with chronic H. pylori gastritis.   EUS  05/14/2012   Normal pancreas, gallbladder,biliary tree/ Incidental finding of simple-appearing right renal cyst.   TONSILLECTOMY     TUBAL LIGATION     VEIN SURGERY     stripping years ago but recent laser surgery; bilateral legs   Social History:  reports that she has quit  smoking. Her smoking use included cigarettes. She started smoking about 55 years ago. She has a 55.2 pack-year smoking history. She has never used smokeless tobacco. She reports that she does not drink alcohol  and does not use drugs.  Allergies[1]  Family History  Problem Relation Age  of Onset   Pancreatic cancer Mother        diagnosed at age 33s, died from heart attack   Inflammatory bowel disease Brother        crohn's, his dgt deceased from colon cancer at age 26   Colon cancer Neg Hx     Prior to Admission medications  Medication Sig Start Date End Date Taking? Authorizing Provider  acetaminophen  (TYLENOL ) 500 MG tablet Take 1,000 mg by mouth in the morning and at bedtime.    [provider]  albuterol  (VENTOLIN  HFA) 108 (90 Base) MCG/ACT inhaler Inhale 1-2 puffs into the lungs every 4 (four) hours as needed for wheezing or shortness of breath. 09/30/23   Ula Prentice SAUNDERS, MD  ALPRAZolam  (XANAX ) 1 MG tablet Take 0.5 tablets (0.5 mg total) by mouth at bedtime. 0.5 tablet in am and 0.5 tab at night 08/19/24   Cheryle Page, MD  aspirin  EC 81 MG tablet Take 1 tablet (81 mg total) by mouth daily. Swallow whole. 10/02/24   Mcarthur Pick, MD  atorvastatin  (LIPITOR) 80 MG tablet Take 80 mg by mouth daily. 11/24/21   [provider]  baclofen  (LIORESAL ) 10 MG tablet Take 10 mg by mouth 2 (two) times daily as needed for muscle spasms. 08/06/24   [provider]  BRILINTA  60 MG TABS tablet Take 60 mg by mouth 2 (two) times daily. 09/07/24   [provider]  cephALEXin  (KEFLEX ) 500 MG capsule Take 1 capsule (500 mg total) by mouth 4 (four) times daily. 12/07/24   Zammit, Joseph, MD  Continuous Glucose Sensor (FREESTYLE LIBRE 3 PLUS SENSOR) MISC Change sensor every 15 days. 09/16/24   Therisa Benton PARAS, NP  estradiol  (ESTRACE ) 0.01 % CREA vaginal cream Apply a pea-sized amount to vaginal opening using index finger 2-3 nights/week 12/01/24   Matilda Senior, MD  gabapentin  (NEURONTIN ) 100 MG capsule Take 1 capsule (100 mg total) by mouth 3 (three) times daily. 08/19/24   Cheryle Page, MD  insulin  degludec (TRESIBA  FLEXTOUCH) 100 UNIT/ML FlexTouch Pen Inject 26 Units into the skin at bedtime. 09/16/24   Therisa Benton PARAS, NP  insulin  lispro  (HUMALOG  KWIKPEN) 100 UNIT/ML KwikPen Inject 8-14 Units into the skin 3 (three) times daily. 09/16/24   Therisa Benton PARAS, NP  levothyroxine  (SYNTHROID ) 175 MCG tablet Take 175 mcg by mouth daily. 10/08/24   [provider]  magnesium  oxide (MAG-OX) 400 (240 Mg) MG tablet Take 400 mg by mouth daily.    [provider]  megestrol  (MEGACE ) 400 MG/10ML suspension Take 10 mLs (400 mg total) by mouth daily. 10/11/24   Shahmehdi, Adriana LABOR, MD  metoprolol  succinate (TOPROL -XL) 25 MG 24 hr tablet Take 25 mg by mouth daily.    [provider]  nitroGLYCERIN  (NITROSTAT ) 0.4 MG SL tablet Place 0.4 mg under the tongue every 5 (five) minutes as needed for chest pain.    [provider]  OXYGEN  Inhale 2 L/min into the lungs at bedtime.    [provider]  pantoprazole  (PROTONIX ) 40 MG tablet Take 40 mg by mouth daily. 06/06/21   [provider]  senna-docusate (SENOKOT-S) 8.6-50 MG tablet Take 1 tablet by mouth at bedtime as needed for  mild constipation. 10/02/24   Sigdel, Santosh, MD  fexofenadine (ALLEGRA) 60 MG tablet Take by mouth.  04/13/21  [provider]    Physical Exam:  Vitals:   12/15/24 2000 12/15/24 2015 12/15/24 2030 12/15/24 2045  BP: (!) 141/76 (!) 157/83 (!) 168/83 (!) 166/83  Pulse: 84 85 89 86  Resp:    16  Temp:      SpO2: 98% 98% 97% 97%  Weight:      Height:       General: Elderly female, laying in bed in no acute distress CVS: S1, S2 are normal CNS: Alert and oriented x 3, moving all extremities equally Respiratory: Decreased air entry on the right side mostly in the right upper lobe Musculoskeletal: Lower extremities with no edema Abdomen: Lower abdominal tenderness, has indwelling Foley catheter in place   Data Reviewed: Recent chest x-ray reviewed showing findings of atelectasis and small left-sided pleural effusion    Latest Ref Rng & Units 12/15/2024    5:46 PM 12/07/2024   11:17 AM 10/15/2024    4:00 AM  CBC   WBC 4.0 - 10.5 K/uL 6.0  6.1  9.3   Hemoglobin 12.0 - 15.0 g/dL 88.8  89.1  9.7   Hematocrit 36.0 - 46.0 % 35.4  34.5  29.9   Platelets 150 - 400 K/uL 292  239  275        Latest Ref Rng & Units 12/15/2024    5:46 PM 12/07/2024   11:17 AM 10/15/2024    4:00 AM  BMP  Glucose 70 - 99 mg/dL 701  882  80   BUN 8 - 23 mg/dL 16  13  14    Creatinine 0.44 - 1.00 mg/dL 8.93  9.02  9.11   Sodium 135 - 145 mmol/L 136  135  137   Potassium 3.5 - 5.1 mmol/L 4.1  4.1  3.3   Chloride 98 - 111 mmol/L 99  102  102   CO2 22 - 32 mmol/L 22  19  23    Calcium  8.9 - 10.3 mg/dL 89.8  9.9  9.2    Assessment and Plan: 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 Patient was last seen by urologist on 12/01/2024 She is currently being planned for cystoscopy and right double-J stent placement by urologist next month. Urinalysis showing findings concerning for UTI Urine culture results on 12/07/2024 showing VRE We will keep patient on ampicillin  based on culture results Consider ID consultation as patient will need possible long-term antibiotics for complicated UTI  Coronary artery disease status post stent placement Continue atorvastatin , Brilinta  and aspirin   Essential hypertension Resume home antihypertensives  Hypothyroidism Continue levothyroxine   Hyperlipidemia Continue statin therapy  GERD History of gastritis and duodenitis Continue pantoprazole   Diabetes mellitus type 2 with hyperglycemia Monitor glucose level Keep on sliding scale for now  COPD Currently not in acute exacerbation Continue as needed nebulization   Advance Care Planning:   Code Status: Prior DNR, confirmed with patient  Consults: None  Family Communication: None present at bedside  Severity of Illness: The appropriate patient status for this patient is  INPATIENT. Inpatient status is judged to be reasonable and necessary in order to provide the required intensity of service to ensure the patient's safety. The patient's presenting symptoms, physical exam findings, and initial radiographic and laboratory data in the context of their chronic comorbidities is felt  to place them at high risk for further clinical deterioration. Furthermore, it is not anticipated that the patient will be medically stable for discharge from the hospital within 2 midnights of admission.   * I certify that at the point of admission it is my clinical judgment that the patient will require inpatient hospital care spanning beyond 2 midnights from the point of admission due to high intensity of service, high risk for further deterioration and high frequency of surveillance required.*  Author: Drue ONEIDA Potter, MD 12/15/2024 9:08 PM  For on call review [email or web address].      [1]  Allergies Allergen Reactions   Amlodipine  Other (See Comments)    Unknown    Ciprocin-Fluocin-Procin [Fluocinolone] Other (See Comments)    Nightmares   Lipitor [Atorvastatin ] Other (See Comments)    Muscle aches, pain   Nebivolol Other (See Comments)    Unknown    Nitrofuran Derivatives Other (See Comments)    Unknown    Lisinopril Cough   Red Yeast Rice [Cholestin] Itching and Rash   "

## 2024-12-15 NOTE — ED Triage Notes (Signed)
 Pt arrived via POV from home with family for concern of dehydration. Pt presents with foley catheter in place and family report Pt is currently taking antibiotics for a UTI from recent hospital visit. Pts family report the Pt has not been drinking much fluids recently has has decreased urinary output in her foley bag.

## 2024-12-15 NOTE — ED Provider Notes (Signed)
 " [facility] Provider Note  CSN: [id number] Arrival date & time: 12/15/24 1628  Chief Complaint(s) Dehydration  HPI ERTHA NABOR is a 79 y.o. female history of COPD, presenting to the emergency department with weakness.  Patient reports fatigue, weakness, not eating or drinking as much.  Patient just reports fatigue and malaise.  Was recently seen in the emergency department, diagnosed with possible UTI.  Urine grew VRE but patient was not called to switch antibiotic.  Denies nausea, vomiting.  Reports decreased appetite.  No diarrhea.  No fevers or chills.  No flank pain.    Past Medical History Past Medical History:  Diagnosis Date   Age-related physical debility 11/17/2021   AKI (acute kidney injury) 09/29/2024   Anxiety 05/28/2023   Arthritis    CAP (community acquired pneumonia) 10/14/2021   Chronic hypoxic respiratory failure (HCC) 06/23/2024   Constipation 06/26/2024   COPD (chronic obstructive pulmonary disease) (HCC)    COPD exacerbation (HCC) 05/27/2024   Diabetes mellitus    DKA (diabetic ketoacidosis) (HCC) 10/14/2021   DVT (deep venous thrombosis) (HCC)    years ago   Failure to thrive in adult 10/07/2024   Gastritis and duodenitis 04/05/2012   GERD (gastroesophageal reflux disease)    H. 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   UTI (urinary tract infection) 07/20/2024    Patient Active Problem List   Diagnosis Date Noted   Urinary tract infection 12/15/2024   DOE (dyspnea on exertion) 10/20/2024   Nocturnal hypoxemia 10/20/2024   AMS (altered mental status) 10/13/2024   Uncontrolled type 2 diabetes mellitus with hyperglycemia (HCC) 10/07/2024   Leukocytosis 10/07/2024   Lactic acidosis 10/07/2024   Normocytic anemia 10/07/2024   Failure to thrive in adult 10/07/2024   UPJ obstruction, 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   AKI (acute kidney injury) 09/29/2024   Sepsis (HCC) 08/15/2024   SIRS (systemic inflammatory response syndrome) (HCC) 07/21/2024   UTI (urinary tract infection) 07/20/2024   Muscle wasting and atrophy, not elsewhere classified, left lower leg 06/29/2024   Other abnormalities of gait and mobility 06/29/2024   Constipation 06/26/2024   Muscle spasm 06/26/2024   OAB (overactive bladder) 06/26/2024   Other toxic encephalopathy 06/26/2024   Oxygen  dependent 06/26/2024   Status post coronary artery stent placement 06/26/2024   Type 2 diabetes mellitus with diabetic neuropathy (HCC) 06/26/2024   Chronic hypoxic respiratory failure (HCC) 06/23/2024   COPD exacerbation (HCC) 05/27/2024   Anxiety 05/28/2023   Vitamin D deficiency 01/31/2022   Age-related physical debility 11/17/2021   Pressure injury of skin 10/16/2021   DKA (diabetic ketoacidosis) (HCC) 10/14/2021   Hypomagnesemia 10/14/2021   Hypoalbuminemia 10/14/2021   CAP (community acquired pneumonia) 10/14/2021   Renal cyst 10/02/2021   History of colonic polyps 08/27/2018   Taking multiple medications for chronic disease 08/27/2018   Type 2 diabetes mellitus with hypoglycemia without coma, with long-term current  use of insulin  (HCC) 09/14/2015   Tobacco abuse counseling 09/14/2015   Rheumatoid arthritis (HCC) 06/08/2015   Diabetes mellitus (HCC) 06/08/2015   Gastritis and duodenitis 04/05/2012   Pancreatitis, acute 04/03/2012   Mixed hyperlipidemia 04/03/2012   Benign hypertension  04/03/2012   Hypothyroidism 04/03/2012   GERD (gastroesophageal reflux disease) 04/03/2012   Home Medication(s) Prior to Admission medications  Medication Sig Start Date End Date Taking? Authorizing Provider  acetaminophen  (TYLENOL ) 500 MG tablet Take 1,000 mg by mouth in the morning and at bedtime.    [provider]  albuterol  (VENTOLIN  HFA) 108 (90 Base) MCG/ACT inhaler Inhale 1-2 puffs into the lungs every 4 (four) hours as needed for wheezing or shortness of breath. 09/30/23   Ula Prentice SAUNDERS, MD  ALPRAZolam  (XANAX ) 1 MG tablet Take 0.5 tablets (0.5 mg total) by mouth at bedtime. 0.5 tablet in am and 0.5 tab at night 08/19/24   Cheryle Page, MD  aspirin  EC 81 MG tablet Take 1 tablet (81 mg total) by mouth daily. Swallow whole. 10/02/24   Sigdel, Derryl, MD  atorvastatin  (LIPITOR) 80 MG tablet Take 80 mg by mouth daily. 11/24/21   [provider]  baclofen  (LIORESAL ) 10 MG tablet Take 10 mg by mouth 2 (two) times daily as needed for muscle spasms. 08/06/24   [provider]  BRILINTA  60 MG TABS tablet Take 60 mg by mouth 2 (two) times daily. 09/07/24   [provider]  cephALEXin  (KEFLEX ) 500 MG capsule Take 1 capsule (500 mg total) by mouth 4 (four) times daily. 12/07/24   Zammit, Joseph, MD  Continuous Glucose Sensor (FREESTYLE LIBRE 3 PLUS SENSOR) MISC Change sensor every 15 days. 09/16/24   Therisa Benton PARAS, NP  estradiol  (ESTRACE ) 0.01 % CREA vaginal cream Apply a pea-sized amount to vaginal opening using index finger 2-3 nights/week 12/01/24   Matilda Senior, MD  gabapentin  (NEURONTIN ) 100 MG capsule Take 1 capsule (100 mg total) by mouth 3 (three) times daily. 08/19/24   Cheryle Page, MD  insulin  degludec (TRESIBA  FLEXTOUCH) 100 UNIT/ML FlexTouch Pen Inject 26 Units into the skin at bedtime. 09/16/24   Therisa Benton PARAS, NP  insulin  lispro (HUMALOG  KWIKPEN) 100 UNIT/ML KwikPen Inject 8-14 Units into the skin 3 (three) times daily. 09/16/24    Therisa Benton PARAS, NP  levothyroxine  (SYNTHROID ) 175 MCG tablet Take 175 mcg by mouth daily. 10/08/24   [provider]  magnesium  oxide (MAG-OX) 400 (240 Mg) MG tablet Take 400 mg by mouth daily.    [provider]  megestrol  (MEGACE ) 400 MG/10ML suspension Take 10 mLs (400 mg total) by mouth daily. 10/11/24   Shahmehdi, Adriana LABOR, MD  metoprolol  succinate (TOPROL -XL) 25 MG 24 hr tablet Take 25 mg by mouth daily.    [provider]  nitroGLYCERIN  (NITROSTAT ) 0.4 MG SL tablet Place 0.4 mg under the tongue every 5 (five) minutes as needed for chest pain.    [provider]  OXYGEN  Inhale 2 L/min into the lungs at bedtime.    [provider]  pantoprazole  (PROTONIX ) 40 MG tablet Take 40 mg by mouth daily. 06/06/21   [provider]  senna-docusate (SENOKOT-S) 8.6-50 MG tablet Take 1 tablet by mouth at bedtime as needed for mild constipation. 10/02/24   Sigdel, Santosh, MD  fexofenadine (ALLEGRA) 60 MG tablet Take by mouth.  04/13/21  [provider]  Past Surgical History Past Surgical History:  Procedure Laterality Date   BACK SURGERY     lumbar x3    ESOPHAGOGASTRODUODENOSCOPY  04/04/2012   Moderate gastritis/MODERATE Duodenitis in the duodenal bulb duodenum.  Pathology with chronic H. pylori gastritis.   EUS  05/14/2012   Normal pancreas, gallbladder,biliary tree/ Incidental finding of simple-appearing right renal cyst.   TONSILLECTOMY     TUBAL LIGATION     VEIN SURGERY     stripping years ago but recent laser surgery; bilateral legs   Family History Family History  Problem Relation Age of Onset   Pancreatic cancer Mother        diagnosed at age 49s, died from heart attack   Inflammatory bowel disease Brother        crohn's, his dgt deceased from colon cancer at age 29   Colon cancer Neg Hx      Social History Social History[1] Allergies Amlodipine , Ciprocin-fluocin-procin [fluocinolone], Lipitor [atorvastatin ], Nebivolol, Nitrofuran derivatives, Lisinopril, and Red yeast rice [cholestin]  Review of Systems Review of Systems  All other systems reviewed and are negative.   Physical Exam Vital Signs  I have reviewed the triage vital signs BP (!) 158/77 (BP Location: Right Arm)   Pulse 87   Temp 98.1 F (36.7 C) (Oral)   Resp 18   Ht 5' 7 (1.702 m)   Wt 70.8 kg   SpO2 100%   BMI 24.45 kg/m  Physical Exam Vitals and nursing note reviewed.  Constitutional:      General: She is not in acute distress.    Appearance: She is well-developed.  HENT:     Head: Normocephalic and atraumatic.     Mouth/Throat:     Mouth: Mucous membranes are dry.  Eyes:     Pupils: Pupils are equal, round, and reactive to light.  Cardiovascular:     Rate and Rhythm: Normal rate and regular rhythm.     Heart sounds: No murmur heard. Pulmonary:     Effort: Pulmonary effort is normal. No respiratory distress.     Breath sounds: Normal breath sounds.  Abdominal:     General: Abdomen is flat.     Palpations: Abdomen is soft.     Tenderness: There is no abdominal tenderness. There is no right CVA tenderness or left CVA tenderness.  Musculoskeletal:        General: No tenderness.     Right lower leg: No edema.     Left lower leg: No edema.  Skin:    General: Skin is warm and dry.  Neurological:     General: No focal deficit present.     Mental Status: She is alert. Mental status is at baseline.  Psychiatric:        Mood and Affect: Mood normal.        Behavior: Behavior normal.     ED Results and Treatments Labs (all labs ordered are listed, but only abnormal results are displayed) Labs Reviewed  CBC WITH DIFFERENTIAL/PLATELET - Abnormal; Notable for the following components:      Result Value   Hemoglobin 11.1 (*)    HCT 35.4 (*)    MCH 25.7 (*)    RDW 16.3 (*)    All  other components within normal limits  COMPREHENSIVE METABOLIC PANEL WITH GFR - Abnormal; Notable for the following components:   Glucose, Bld 298 (*)    Creatinine, Ser 1.06 (*)    GFR, Estimated 53 (*)    All other components  within normal limits  URINALYSIS, ROUTINE W REFLEX MICROSCOPIC - Abnormal; Notable for the following components:   APPearance HAZY (*)    Glucose, UA >=500 (*)    Hgb urine dipstick SMALL (*)    Protein, ur 30 (*)    Leukocytes,Ua LARGE (*)    Bacteria, UA RARE (*)    All other components within normal limits  GLUCOSE, CAPILLARY - Abnormal; Notable for the following components:   Glucose-Capillary 427 (*)    All other components within normal limits  URINE CULTURE  LIPASE, BLOOD  BASIC METABOLIC PANEL WITH GFR  CBC                                                                                                                          Radiology No results found.  Pertinent labs & imaging results that were available during my care of the patient were reviewed by me and considered in my medical decision making (see MDM for details).  Medications Ordered in ED Medications  enoxaparin  (LOVENOX ) injection 40 mg (40 mg Subcutaneous Given 12/15/24 2351)  ipratropium-albuterol  (DUONEB) 0.5-2.5 (3) MG/3ML nebulizer solution 3 mL (has no administration in time range)  insulin  aspart (novoLOG ) injection 0-9 Units (has no administration in time range)  insulin  aspart (novoLOG ) injection 0-5 Units ( Subcutaneous Not Given 12/15/24 2350)  acetaminophen  (TYLENOL ) tablet 650 mg (650 mg Oral Given 12/15/24 2351)  ALPRAZolam  (XANAX ) tablet 0.5 mg (0.5 mg Oral Given 12/15/24 2353)  aspirin  EC tablet 81 mg (has no administration in time range)  atorvastatin  (LIPITOR) tablet 80 mg (has no administration in time range)  ticagrelor  (BRILINTA ) tablet 60 mg (has no administration in time range)  baclofen  (LIORESAL ) tablet 10 mg (10 mg Oral Given 12/15/24 2351)  gabapentin  (NEURONTIN )  capsule 100 mg (100 mg Oral Given 12/15/24 2353)  levothyroxine  (SYNTHROID ) tablet 175 mcg (has no administration in time range)  metoprolol  succinate (TOPROL -XL) 24 hr tablet 25 mg (25 mg Oral Given 12/15/24 2352)  pantoprazole  (PROTONIX ) EC tablet 40 mg (has no administration in time range)  senna-docusate (Senokot-S) tablet 1 tablet (has no administration in time range)  ampicillin  (OMNIPEN) 1 g in sodium chloride  0.9 % 100 mL IVPB (has no administration in time range)  insulin  glargine-yfgn (SEMGLEE ) injection 15 Units (has no administration in time range)  sodium chloride  0.9 % bolus 1,000 mL (0 mLs Intravenous Stopped 12/15/24 2047)  ampicillin  (OMNIPEN) injection 1 g (1 g Intravenous Given 12/15/24 2129)  insulin  aspart (novoLOG ) injection 6 Units (6 Units Subcutaneous Given 12/15/24 2350)  Procedures Procedures  (including critical care time)  Medical Decision Making / ED Course   MDM:  79 year old presenting to the emergency department with weakness.  Patient is overall well-appearing, physical exam with no focal abnormality other than some mild dehydration.  Given recent possible UTI, Foley catheter was replaced and urine was evaluated, does show some signs of persistent infection.  Urine culture earlier grew VRE, Keflex  would not treat this.  It is susceptible to ampicillin .  Will order her IV antibiotic for this.  Given her fatigue and weakness, discussed with family they would prefer she come in to the hospital.  Discussed with hospitalist who admitted patient       Additional history obtained: -Additional history obtained from family -External records from outside source obtained and reviewed including: Chart review including previous notes, labs, imaging, consultation notes including prior notes    Lab Tests: -I ordered, reviewed, and  interpreted labs.   The pertinent results include:   Labs Reviewed  CBC WITH DIFFERENTIAL/PLATELET - Abnormal; Notable for the following components:      Result Value   Hemoglobin 11.1 (*)    HCT 35.4 (*)    MCH 25.7 (*)    RDW 16.3 (*)    All other components within normal limits  COMPREHENSIVE METABOLIC PANEL WITH GFR - Abnormal; Notable for the following components:   Glucose, Bld 298 (*)    Creatinine, Ser 1.06 (*)    GFR, Estimated 53 (*)    All other components within normal limits  URINALYSIS, ROUTINE W REFLEX MICROSCOPIC - Abnormal; Notable for the following components:   APPearance HAZY (*)    Glucose, UA >=500 (*)    Hgb urine dipstick SMALL (*)    Protein, ur 30 (*)    Leukocytes,Ua LARGE (*)    Bacteria, UA RARE (*)    All other components within normal limits  GLUCOSE, CAPILLARY - Abnormal; Notable for the following components:   Glucose-Capillary 427 (*)    All other components within normal limits  URINE CULTURE  LIPASE, BLOOD  BASIC METABOLIC PANEL WITH GFR  CBC    Notable for mild anemia , mild AKI   EKG   EKG Interpretation Date/Time:    Ventricular Rate:    PR Interval:    QRS Duration:    QT Interval:    QTC Calculation:   R Axis:      Text Interpretation:            Medicines ordered and prescription drug management: Meds ordered this encounter  Medications   sodium chloride  0.9 % bolus 1,000 mL   ampicillin  (OMNIPEN) injection 1 g   enoxaparin  (LOVENOX ) injection 40 mg   ipratropium-albuterol  (DUONEB) 0.5-2.5 (3) MG/3ML nebulizer solution 3 mL   insulin  aspart (novoLOG ) injection 0-9 Units    Correction coverage::   Sensitive (thin, NPO, renal)    CBG < 70::   Implement Hypoglycemia Standing Orders and refer to Hypoglycemia Standing Orders sidebar report    CBG 70 - 120::   0 units    CBG 121 - 150::   1 unit    CBG 151 - 200::   2 units    CBG 201 - 250::   3 units    CBG 251 - 300::   5 units    CBG 301 - 350::   7 units     CBG 351 - 400:   9 units    CBG > 400:   call MD and  obtain STAT lab verification   insulin  aspart (novoLOG ) injection 0-5 Units    Correction coverage::   HS scale    CBG < 70::   Implement Hypoglycemia Standing Orders and refer to Hypoglycemia Standing Orders sidebar report    CBG 70 - 120::   0 units    CBG 121 - 150::   0 units    CBG 151 - 200::   0 units    CBG 201 - 250::   2 units    CBG 251 - 300::   3 units    CBG 301 - 350::   4 units    CBG 351 - 400::   5 units    CBG > 400:   call MD and obtain STAT lab verification   acetaminophen  (TYLENOL ) tablet 650 mg   ALPRAZolam  (XANAX ) tablet 0.5 mg    0.5 tablet in am and 0.5 tab at night     aspirin  EC tablet 81 mg    Swallow whole.     atorvastatin  (LIPITOR) tablet 80 mg   ticagrelor  (BRILINTA ) tablet 60 mg   baclofen  (LIORESAL ) tablet 10 mg   gabapentin  (NEURONTIN ) capsule 100 mg   levothyroxine  (SYNTHROID ) tablet 175 mcg   metoprolol  succinate (TOPROL -XL) 24 hr tablet 25 mg   pantoprazole  (PROTONIX ) EC tablet 40 mg   senna-docusate (Senokot-S) tablet 1 tablet   ampicillin  (OMNIPEN) 1 g in sodium chloride  0.9 % 100 mL IVPB    Antibiotic Indication::   Other Indication (list below)    Other Indication::   VRE UTI   DISCONTD: insulin  glargine (LANTUS ) Solostar Pen 15 Units   insulin  glargine-yfgn (SEMGLEE ) injection 15 Units   insulin  aspart (novoLOG ) injection 6 Units    -I have reviewed the patients home medicines and have made adjustments as needed   Consultations Obtained: I requested consultation with the hospitalist,  and discussed lab and imaging findings as well as pertinent plan - they recommend: admission   Cardiac Monitoring: The patient was maintained on a cardiac monitor.  I personally viewed and interpreted the cardiac monitored which showed an underlying rhythm of: NSR  Reevaluation: After the interventions noted above, I reevaluated the patient and found that their symptoms have improved  Co  morbidities that complicate the patient evaluation  Past Medical History:  Diagnosis Date   Age-related physical debility 11/17/2021   AKI (acute kidney injury) 09/29/2024   Anxiety 05/28/2023   Arthritis    CAP (community acquired pneumonia) 10/14/2021   Chronic hypoxic respiratory failure (HCC) 06/23/2024   Constipation 06/26/2024   COPD (chronic obstructive pulmonary disease) (HCC)    COPD exacerbation (HCC) 05/27/2024   Diabetes mellitus    DKA (diabetic ketoacidosis) (HCC) 10/14/2021   DVT (deep venous thrombosis) (HCC)    years ago   Failure to thrive in adult 10/07/2024   Gastritis and duodenitis 04/05/2012   GERD (gastroesophageal reflux disease)    H. 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    Final Clinical Impression(s) / ED Diagnoses Final diagnoses:  Urinary tract infection without hematuria, site unspecified     This chart was dictated using voice recognition software.  Despite best efforts to proofread,  errors can occur which can change the documentation meaning.     [1]  Social History Tobacco Use   Smoking status: Former    Current packs/day: 1.00    Average packs/day: 1 pack/day for 55.2 years (55.2  ttl pk-yrs)    Types: Cigarettes    Start date: 10/19/1969   Smokeless tobacco: Never   Tobacco comments:    Quit in 2017  Vaping Use   Vaping status: Never Used  Substance Use Topics   Alcohol  use: No   Drug use: No     Francesca Elsie CROME, MD 12/16/24 0004  "

## 2024-12-16 DIAGNOSIS — N3000 Acute cystitis without hematuria: Secondary | ICD-10-CM | POA: Diagnosis not present

## 2024-12-16 LAB — BASIC METABOLIC PANEL WITH GFR
Anion gap: 13 (ref 5–15)
BUN: 20 mg/dL (ref 8–23)
CO2: 22 mmol/L (ref 22–32)
Calcium: 9.7 mg/dL (ref 8.9–10.3)
Chloride: 102 mmol/L (ref 98–111)
Creatinine, Ser: 0.81 mg/dL (ref 0.44–1.00)
GFR, Estimated: >60 mL/min
Glucose, Bld: 343 mg/dL — ABNORMAL HIGH (ref 70–99)
Potassium: 3.8 mmol/L (ref 3.5–5.1)
Sodium: 137 mmol/L (ref 135–145)

## 2024-12-16 LAB — CBC
HCT: 32.5 % — ABNORMAL LOW (ref 36.0–46.0)
Hemoglobin: 10.6 g/dL — ABNORMAL LOW (ref 12.0–15.0)
MCH: 26.6 pg (ref 26.0–34.0)
MCHC: 32.6 g/dL (ref 30.0–36.0)
MCV: 81.5 fL (ref 80.0–100.0)
Platelets: 238 K/uL (ref 150–400)
RBC: 3.99 MIL/uL (ref 3.87–5.11)
RDW: 16.2 % — ABNORMAL HIGH (ref 11.5–15.5)
WBC: 6.3 K/uL (ref 4.0–10.5)
nRBC: 0 % (ref 0.0–0.2)

## 2024-12-16 LAB — GLUCOSE, CAPILLARY
Glucose-Capillary: 280 mg/dL — ABNORMAL HIGH (ref 70–99)
Glucose-Capillary: 418 mg/dL — ABNORMAL HIGH (ref 70–99)
Glucose-Capillary: 280 mg/dL — ABNORMAL HIGH (ref 70–99)
Glucose-Capillary: 224 mg/dL — ABNORMAL HIGH (ref 70–99)

## 2024-12-16 MED ORDER — INSULIN ASPART 100 UNIT/ML IJ SOLN
4.0000 [IU] | Freq: Three times a day (TID) | INTRAMUSCULAR | Status: DC
  Administered 2024-12-16 – 2024-12-17 (×3): 4 [IU] via SUBCUTANEOUS
  Filled 2024-12-16 (×3): qty 1

## 2024-12-16 MED ORDER — INSULIN ASPART 100 UNIT/ML IJ SOLN
12.0000 [IU] | Freq: Once | INTRAMUSCULAR | Status: AC
  Administered 2024-12-16: 12 [IU] via SUBCUTANEOUS
  Filled 2024-12-16: qty 1

## 2024-12-16 MED ORDER — INSULIN GLARGINE-YFGN 100 UNIT/ML ~~LOC~~ SOLN
25.0000 [IU] | Freq: Every day | SUBCUTANEOUS | Status: DC
  Administered 2024-12-16: 25 [IU] via SUBCUTANEOUS
  Filled 2024-12-16 (×2): qty 0.25

## 2024-12-16 MED ORDER — CHLORHEXIDINE GLUCONATE CLOTH 2 % EX PADS
6.0000 | MEDICATED_PAD | Freq: Every day | CUTANEOUS | Status: DC
  Administered 2024-12-16 – 2024-12-17 (×2): 6 via TOPICAL

## 2024-12-16 MED ORDER — OXYCODONE HCL 5 MG PO TABS
5.0000 mg | ORAL_TABLET | Freq: Four times a day (QID) | ORAL | Status: DC | PRN
  Administered 2024-12-16 (×2): 5 mg via ORAL
  Filled 2024-12-16 (×2): qty 1

## 2024-12-16 NOTE — Patient Instructions (Signed)
 Adaptive Equipment Note: If you have had a hip or knee replaced, dress  your surgical leg first (the side that was operated on).  Undress your surgical leg last. If you have had a hip  replaced, and you have hip precautions, follow your  own hip precautions when using this equipment. Reacher You can use a reacher to pick up light-weight objects  that are out of reach. You can also use it to get dressed: 1. Use the reacher to grasp the waistband of your  pants or skirt. For underwear, grasp around the leg  opening so it will be easy to get your foot through. 2. Lower the clothing to the floor. 3. Put your foot in the leg hole. (For skirts, put both  feet in.) 4. Pull the clothing up so you can reach it with  your hand. 5. If putting on pants or underwear, do the same for  the other leg. Dressing stick You can use a dressing stick to dress the lower part  of your body: 1. Hook the dressing stick through the belt loop at  the front of your pants. Or place the hook inside  the front of the waistband (for pants, skirt or  underwear). 2. Lower the clothing to the floor. 3. Put your foot in. (For skirts, put both feet in.) 4. Pull the clothing up so you can reach it with  your hand. 5. If putting on pants or underwear, do the same for  the other leg. To remove socks: 1. Slide the dressing stick  down into the sock, by  the heel. 2. Push the sock off. Page 2 of 4 Sock aid A sock aid lets you put your socks on without bending. 1. Put your sock on the sock aid. The heel goes on  the bottom. The toe of the sock should be pulled  tight against the sock aid. 2. Hold the straps with  both hands. 3. Lower the sock aid to  the floor. 4. Place your foot into  the sock aid. 5. Point your toes and  pull the straps until your sock is on. Long-handled shoehorn This lets you put your shoes on with  little bending. Simply place the  shoehorn in the heel of the  shoe. Then, slide your  heel  along the shoehorn to slip  your shoe on. Elastic shoelaces With elastic shoelaces,  you can slip your foot  into your shoe to avoid  bending or tying. The first  time you use them: 1. Thread the shoelaces  through your shoes. Be  sure to lace through the  tongue slot, if you have one. 2. Tie a knot or a double bow. Keep the laces tied at  all times. To put on shoes: 1. With the laces tied, slip your foot into the shoe. If  you have a reacher, use it to hold onto the tongue  of your shoe while you put it on. Or use a longhandled shoehorn and slide your foot into the shoe. 2. The laces will stretch as you put your foot in. They  will tighten around your foot once the shoe is on. Leg lifter This is mostly used to move your leg when getting in  and out of bed. If you need a leg lifter, your therapist  will give you one and show you how to use it. Page 3 of 4 Commode A commode can be used in different ways, so there  are different  kinds.  A stationary or bedside commode is used if  you have trouble getting to the bathroom toilet.  This commode is placed near the bed and has a  bucket that can be emptied into the toilet.  You can use an all-in-one or 3-in-1 commode  either at your bedside or in the bathroom. The  back is open, so you can remove the bucket and  place the commode  over the toilet as  needed. Putting it  over the toilet raises  the seat and gives you  handrails, making it  easier to sit down and  get up. You can make the legs of  either commode shorter or taller. Raised toilet seat A raised seat makes it easier to get on and off the  toilet. This is helpful to many people, including those  who have had a hip replaced. It keeps you from  bending too much at the hips as  you lower yourself onto the seat. It  also helps if you have trouble with  strength or balance when getting  on the toilet. Toilet safety frame With a safety frame, you   can push up from the  rails when getting on  and off the toilet. The  frame attaches to the  screws on your toilet  seat cover. You can make  the legs shorter or taller  as needed. Toilet aid If you have problems bending, twisting or reaching,  you can use a toilet aid to wipe yourself after using  the toilet. 1. Roll the toilet paper into a ball. 2. Grip part of the ball with the toilet aid. 3. Wipe. 4. Open the handle to drop  the used tissue into  the toilet. Grab bars You can install grab bars near your toilet and in your  bathtub or shower. This will give you something stable  to hold onto. Towel bars, soap dishes and shower  doors are not strong enough to hold your weight. Grab bars come in different sizes  and angles. Some clamp onto  the edge of the tub; others are  mounted to the studs in the wall. Page 4 of 4 Long-handled sponge It you have trouble bending  or twisting, the sponge will  help you wash your back, feet  and lower legs. Tub transfer bench  (also called an extended tub bench) A tub transfer bench is for anyone who has trouble  stepping into or out of the bathtub or who has  weight-bearing restrictions. 1. Walk up to the edge of the tub and turn around.  You should feel the bench at the back of  your knees. 2. Sit down on  the bench. 3. Staying seated, start  turning to face the  front of the tub. 4. Lift your feet over  the edge of the tub,  one at a time. (If you  have had your hip  replaced, do not lift  your knee higher than your hip.) 5. Reverse this process to get out of the tub. Tub or shower chair A tub or shower chair can help if  you have problems standing in  the shower or sitting at the  bottom of a bathtub. It  lets you sit on the chair  while you bathe. You  can use a hand-held  shower head along with  the chair. Hand-held shower head This lets you control the spray of  the water  as you bathe. If you are  using a  shower chair or tub transfer  bench, you may need a hand-held  shower head

## 2024-12-16 NOTE — Plan of Care (Signed)
" °  Problem: Acute Rehab PT Goals(only PT should resolve) Goal: Pt Will Go Supine/Side To Sit Outcome: Progressing Flowsheets (Taken 12/16/2024 1216) Pt will go Supine/Side to Sit:  with modified independence  with supervision Goal: Patient Will Transfer Sit To/From Stand Outcome: Progressing Flowsheets (Taken 12/16/2024 1216) Patient will transfer sit to/from stand:  with supervision  with contact guard assist Goal: Pt Will Transfer Bed To Chair/Chair To Bed Outcome: Progressing Flowsheets (Taken 12/16/2024 1216) Pt will Transfer Bed to Chair/Chair to Bed:  with supervision  with contact guard assist Goal: Pt Will Ambulate Outcome: Progressing Flowsheets (Taken 12/16/2024 1216) Pt will Ambulate:  75 feet  with contact guard assist  with supervision  with rolling walker   12:16 PM, 12/16/2024 Lynwood Music, MPT Physical Therapist with [facility] [phone] office [phone] mobile phone  "

## 2024-12-16 NOTE — Plan of Care (Signed)
   Problem: Education: Goal: Knowledge of General Education information will improve Description: Including pain rating scale, medication(s)/side effects and non-pharmacologic comfort measures Outcome: Progressing   Problem: Activity: Goal: Risk for activity intolerance will decrease Outcome: Progressing   Problem: Nutrition: Goal: Adequate nutrition will be maintained Outcome: Progressing

## 2024-12-16 NOTE — Evaluation (Signed)
 Physical Therapy Evaluation Patient Details Name: Gabrielle Wood MRN: [id number] DOB: [birth year] Today's Date: 12/16/2024  History of Present Illness  Gabrielle Wood is a 79 y.o. female with medical history significant for hypertension, type 2 diabetes, hyperlipidemia, coronary artery disease status post PCI with stenting, hypothyroidism, GERD, who presents to the ER from home due to altered mental status.  Recently admitted from 11/5 through 10/11/2024 due to dehydration with AKI, failure to thrive, chronic right UPJ obstruction, and acute urinary retention with Foley catheter placed.  The patient had been doing fine up until today.  She went to her urologist and after she returned home, about 6 hours later, she was noted to be confused and agitated.  She was brought to the ER for further evaluation.   Clinical Impression  Patient demonstrates slow labored movement for sitting up at bedside, had to use bed rail for supine sitting, required repeated attempts for completing sit to stands, once standing demonstrated fair/good return for ambulating in room, hallway with occasional drifting left/right and required increased time for making turns. Patient tolerated sitting up in chair after therapy. Patient will benefit from continued skilled physical therapy in hospital and recommended venue below to increase strength, balance, endurance for safe ADLs and gait.           If plan is discharge home, recommend the following: A little help with walking and/or transfers;A little help with bathing/dressing/bathroom;Help with stairs or ramp for entrance;Assist for transportation;Assistance with cooking/housework   Can travel by private vehicle        Equipment Recommendations None recommended by PT  Recommendations for Other Services       Functional Status Assessment Patient has had a recent decline in their functional status and demonstrates the ability to make significant improvements in function  in a reasonable and predictable amount of time.     Precautions / Restrictions Precautions Precautions: Fall Recall of Precautions/Restrictions: Intact Restrictions Weight Bearing Restrictions Per Provider Order: No      Mobility  Bed Mobility Overal bed mobility: Needs Assistance Bed Mobility: Supine to Sit     Supine to sit: Min assist, Contact guard     General bed mobility comments: increased time, HOB slightly raised, had to use bed rail for supine to sitting    Transfers Overall transfer level: Needs assistance Equipment used: Rolling walker (2 wheels) Transfers: Sit to/from Stand, Bed to chair/wheelchair/BSC Sit to Stand: Contact guard assist, Min assist   Step pivot transfers: Min assist       General transfer comment: increased time with repeated attempts for completing sit to stands due to BLE weakness    Ambulation/Gait Ambulation/Gait assistance: Contact guard assist, Min assist Gait Distance (Feet): 40 Feet Assistive device: Rolling walker (2 wheels) Gait Pattern/deviations: Decreased step length - right, Decreased step length - left, Decreased stride length, Drifts right/left Gait velocity: decreased     General Gait Details: slow labored movement with occasional drifting left/right, no loss of balance and limited mostly due to fatigue  Stairs            Wheelchair Mobility     Tilt Bed    Modified Rankin (Stroke Patients Only)       Balance Overall balance assessment: Needs assistance Sitting-balance support: Feet supported, No upper extremity supported Sitting balance-Leahy Scale: Fair Sitting balance - Comments: fair/good seated at EOB   Standing balance support: Reliant on assistive device for balance, During functional activity, Bilateral upper extremity supported Standing balance-Leahy Scale:  Fair Standing balance comment: using RW                             Pertinent Vitals/Pain Pain Assessment Pain  Assessment: No/denies pain    Home Living Family/patient expects to be discharged to:: Private residence Living Arrangements: Alone Available Help at Discharge: Family;Available PRN/intermittently Type of Home: House Home Access: Stairs to enter Entrance Stairs-Rails: Right;Left;Can reach both Entrance Stairs-Number of Steps: 3   Home Layout: One level Home Equipment: Agricultural Consultant (2 wheels);Shower seat;Shower seat - built in;Grab bars - tub/shower;Wheelchair - manual;BSC/3in1 Additional Comments: Same history as recent admission.    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 ADLs Comments: A little assist for bathing, dressing, and assisted for IADL's. PRN assist for peri-care.     Extremity/Trunk Assessment   Upper Extremity Assessment Upper Extremity Assessment: Defer to OT evaluation    Lower Extremity Assessment Lower Extremity Assessment: Generalized weakness    Cervical / Trunk Assessment Cervical / Trunk Assessment: Normal  Communication   Communication Communication: Impaired Factors Affecting Communication: Hearing impaired    Cognition Arousal: Alert Behavior During Therapy: WFL for tasks assessed/performed                             Following commands: Intact       Cueing       General Comments      Exercises     Assessment/Plan    PT Assessment Patient needs continued PT services  PT Problem List Decreased strength;Decreased activity tolerance;Decreased mobility;Decreased balance       PT Treatment Interventions DME instruction;Gait training;Stair training;Functional mobility training;Therapeutic activities;Therapeutic exercise;Balance training;Patient/family education    PT Goals (Current goals can  be found in the Care Plan section)  Acute Rehab PT Goals Patient Stated Goal: return home with family to assist PT Goal Formulation: With patient Time For Goal Achievement: 12/30/24 Potential to Achieve Goals: Good    Frequency Min 3X/week     Co-evaluation   Reason for Co-Treatment: To address functional/ADL transfers   OT goals addressed during session: ADL's and self-care       AM-PAC PT 6 Clicks Mobility  Outcome Measure Help needed turning from your back to your side while in a flat bed without using bedrails?: A Little Help needed moving from lying on your back to sitting on the side of a flat bed without using bedrails?: A Little Help needed moving to and from a bed to a chair (including a wheelchair)?: A Little Help needed standing up from a chair using your arms (e.g., wheelchair or bedside chair)?: A Lot Help needed to walk in hospital room?: A Little Help needed climbing 3-5 steps with a railing? : A Lot 6 Click Score: 16    End of Session   Activity Tolerance: Patient tolerated treatment well;Patient limited by fatigue Patient left: in chair;with call bell/phone within reach Nurse Communication: Mobility status PT Visit Diagnosis: Unsteadiness on feet (R26.81);Other abnormalities of gait and mobility (R26.89);Muscle weakness (generalized) (M62.81)    Time: 8964-8896 PT Time Calculation (min) (ACUTE ONLY): 28 min   Charges:   PT Evaluation $PT Eval Moderate Complexity: 1 Mod PT Treatments $Therapeutic Activity: 23-37 mins PT General Charges $$ ACUTE PT VISIT: 1  Visit         12:14 PM, 12/16/2024 Lynwood Music, MPT Physical Therapist with [facility] [phone] office [phone] mobile phone

## 2024-12-16 NOTE — Inpatient Diabetes Management (Signed)
 Inpatient Diabetes Program Recommendations  AACE/ADA: New Consensus Statement on Inpatient Glycemic Control   Target Ranges:  Prepandial:   less than 140 mg/dL      Peak postprandial:   less than 180 mg/dL (1-2 hours)      Critically ill patients:  140 - 180 mg/dL    Latest Reference Range & Units 12/15/24 22:41 12/16/24 07:48  Glucose-Capillary 70 - 99 mg/dL 572 (H) 719 (H)    Review of Glycemic Control  Diabetes history: DM2 Outpatient Diabetes medications: Tresiba  26 units at bedtime, Humalog  8-14 units TID with meals Current orders for Inpatient glycemic control: Semglee  15 units at bedtime, Novolog  0-9 units TID with meals, Novolog  0-5 units QHS  Inpatient Diabetes Program Recommendations:    Insulin : CBG 280 mg/dl this morning.  Please consider increasing Semglee  to 25 units at bedtime and adding Novolog  4 units TID with meals for meal coverage if patient eats at least 50% of meals.  Thanks, Earnie Gainer, RN, MSN, CDCES Diabetes Coordinator Inpatient Diabetes Program [phone] (Team Pager from 8am to 5pm)

## 2024-12-16 NOTE — Plan of Care (Signed)

## 2024-12-16 NOTE — TOC Initial Note (Signed)
 Transition of Care [facility]) - Initial/Assessment Note    Patient Details  Name: Gabrielle Wood MRN: [id number] Date of Birth: [birth year]  Transition of Care [facility]) CM/SW Contact:    [name], LCSW Phone Number: 12/16/2024, 10:46 AM  Clinical Narrative:                 Pt assessed due to high risk for readmission. Pt lives at home with her son. Pt is ambulatory with a RW at baseline. Pt's son assists with ADL's as needed and provides transportation for pt to appointments. Pt is active with [facility] for HHPT services. [facility] HH orders will need to be placed prior to discharge. ICM will continue to follow.   Expected Discharge Plan: Home w Home Health Services Barriers to Discharge: Continued Medical Work up   Patient Goals and CMS Choice Patient states their goals for this hospitalization and ongoing recovery are:: To return home          Expected Discharge Plan and Services In-house Referral: Clinical Social Work Discharge Planning Services: NA Post Acute Care Choice: Home Health, Resumption of Svcs/PTA Provider Living arrangements for the past 2 months: Single Family Home                 DME Arranged: N/A DME Agency: NA                  Prior Living Arrangements/Services Living arrangements for the past 2 months: Single Family Home Lives with:: Adult Children Patient language and need for interpreter reviewed:: Yes Do you feel safe going back to the place where you live?: Yes      Need for Family Participation in Patient Care: Yes (Comment) Care giver support system in place?: Yes (comment) Current home services: DME, Home PT (RW, HHPT w/ [facility]) Criminal Activity/Legal Involvement Pertinent to Current Situation/Hospitalization: No - Comment as needed  Activities of Daily Living      Permission Sought/Granted Permission sought to share information with : Facility Medical Sales Representative, Family Supports Permission granted to share information with : Yes,  Verbal Permission Granted  Share Information with NAME: Saanvi, [place], Emergency Contact  [phone]  Permission granted to share info w AGENCY: [phone]        Emotional Assessment Appearance:: Appears stated age Attitude/Demeanor/Rapport: Engaged Affect (typically observed): Pleasant Orientation: : Oriented to Self, Oriented to Place, Oriented to Situation Alcohol  / Substance Use: Not Applicable Psych Involvement: No (comment)  Admission diagnosis:  Urinary tract infection [N39.0] Patient Active Problem List   Diagnosis Date Noted   Urinary tract infection 12/15/2024   DOE (dyspnea on exertion) 10/20/2024   Nocturnal hypoxemia 10/20/2024   AMS (altered mental status) 10/13/2024   Uncontrolled type 2 diabetes mellitus with hyperglycemia (HCC) 10/07/2024   Leukocytosis 10/07/2024   Lactic acidosis 10/07/2024   Normocytic anemia 10/07/2024   Failure to thrive in adult 10/07/2024   UPJ obstruction, 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   AKI (acute kidney injury) 09/29/2024   Sepsis (HCC) 08/15/2024   SIRS (systemic inflammatory response syndrome) (HCC) 07/21/2024   UTI (urinary tract infection) 07/20/2024   Muscle wasting and atrophy, not elsewhere classified, left lower leg 06/29/2024   Other abnormalities of gait and mobility 06/29/2024   Constipation 06/26/2024   Muscle spasm 06/26/2024   OAB (overactive bladder) 06/26/2024   Other toxic encephalopathy 06/26/2024   Oxygen  dependent 06/26/2024   Status post coronary artery stent placement 06/26/2024   Type 2 diabetes mellitus with diabetic neuropathy (HCC) 06/26/2024  Chronic hypoxic respiratory failure (HCC) 06/23/2024   COPD exacerbation (HCC) 05/27/2024   Anxiety 05/28/2023   Vitamin D deficiency 01/31/2022   Age-related physical debility 11/17/2021   Pressure injury of skin 10/16/2021   DKA (diabetic ketoacidosis) (HCC) 10/14/2021   Hypomagnesemia 10/14/2021   Hypoalbuminemia 10/14/2021   CAP (community acquired  pneumonia) 10/14/2021   Renal cyst 10/02/2021   History of colonic polyps 08/27/2018   Taking multiple medications for chronic disease 08/27/2018   Type 2 diabetes mellitus with hypoglycemia without coma, with long-term current use of insulin  (HCC) 09/14/2015   Tobacco abuse counseling 09/14/2015   Rheumatoid arthritis (HCC) 06/08/2015   Diabetes mellitus (HCC) 06/08/2015   Gastritis and duodenitis 04/05/2012   Pancreatitis, acute 04/03/2012   Mixed hyperlipidemia 04/03/2012   Benign hypertension 04/03/2012   Hypothyroidism 04/03/2012   GERD (gastroesophageal reflux disease) 04/03/2012   PCP:  [name], MD Pharmacy:   [facility], [place] - [address]  [place] [address] Phone: [phone] Fax: [phone]     Social Drivers of Health (SDOH) Social History: SDOH Screenings   Food Insecurity: No Food Insecurity (12/16/2024)  Housing: Low Risk (12/16/2024)  Transportation Needs: No Transportation Needs (12/16/2024)  Utilities: Not At Risk (12/16/2024)  Financial Resource Strain: Low Risk (08/11/2024)   [facility] from [facility]  Physical Activity: Inactive (08/11/2024)   Received from [facility]  Social Connections: Unknown (12/16/2024)  Recent Concern: Social Connections - Socially Isolated (10/07/2024)  Stress: No Stress Concern Present (08/11/2024)   [facility] from [facility]  Tobacco Use: Medium Risk (12/15/2024)  Health Literacy: Medium Risk (08/11/2024)   Received from [facility]   SDOH Interventions:     Readmission Risk Interventions    12/16/2024   10:11 AM 10/14/2024   12:40 PM 10/11/2024   10:33 AM  Readmission Risk Prevention Plan  Transportation Screening Complete Complete Complete  PCP or Specialist Appt within 3-5 Days Complete    Home Care Screening   Complete  Medication Review (RN CM)   Complete  HRI or Home Care Consult Complete Complete   Social Work Consult for Recovery Care Planning/Counseling  Complete Complete   Palliative Care Screening Not Applicable Not Applicable   Medication Review Oceanographer) Complete Complete

## 2024-12-16 NOTE — Progress Notes (Signed)
 " PROGRESS NOTE    [name]  FMW:[id number] DOB: 29-Sep-1946 DOA: 12/15/2024 PCP: [name], MD   Brief Narrative:    Gabrielle Wood is a 79 y.o. 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  Patient was recently seen in the emergency room here on 12/07/2024 after she presented with generalized weakness and leakage of urine around the Foley catheter.  Urinalysis around that time showed findings of UTI and urine culture was taken.  Patient was discharged home around that time on Keflex  which she has been taking with no improvement in symptoms.  Urine culture results showing VRE.  Patient came back today because she was still having persistent weakness and now with associated lower abdominal pain.  She was admitted for treatment of her complicated UTI secondary to VRE and started on ampicillin .  She is also noted to have yeast in her urine cultures.  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 Patient was last seen by urologist on 12/01/2024 She is currently being planned for cystoscopy and right double-J stent placement by urologist next month. Urinalysis showing findings concerning for UTI Urine culture results on 12/07/2024 showing VRE We will keep patient on ampicillin  based on culture results and transition to amoxicillin  on discharge Start Diflucan  1/14   Coronary artery disease status post stent placement Continue  atorvastatin , Brilinta  and aspirin    Essential hypertension Resume home antihypertensives   Hypothyroidism Continue levothyroxine    Hyperlipidemia Continue statin therapy   GERD History of gastritis and duodenitis Continue pantoprazole    Diabetes mellitus type 2 with hyperglycemia Monitor glucose level Keep on sliding scale for now Noted to have elevated blood glucose readings with plans to increase dose of Lantus  to 25 units at bedtime and add NovoLog  4 units 3 times daily   COPD Currently not in acute exacerbation Continue as needed nebulization    DVT prophylaxis:Lovenox  Code Status: DNR Family Communication: None at bedside Disposition Plan:  Status is: Inpatient Remains inpatient appropriate because: Need for IV medications.  Consultants:  ID  Procedures:  None  Antimicrobials:  Anti-infectives (From admission, onward)    Start     Dose/Rate Route Frequency Ordered Stop   12/16/24 0300  ampicillin  (OMNIPEN) 1 g in sodium chloride  0.9 % 100 mL IVPB        1 g 300 mL/hr over 20 Minutes Intravenous Every 6 hours 12/15/24 2155     12/15/24 2100  ampicillin  (OMNIPEN) injection 1 g        1 g Intravenous  Once 12/15/24 2056 12/15/24 2129       Subjective: Patient seen and evaluated today with no new acute complaints or concerns. No acute concerns or events noted overnight.  Objective: Vitals:   12/16/24 0143 12/16/24 0534 12/16/24 0837 12/16/24 1258  BP: 123/72 (!) 117/58 128/61 117/65  Pulse: 73 69 85 73  Resp: 18 14 16 18   Temp: (!) 97.5 F (36.4 C) (!) 97.5 F (36.4 C) (!) 97.4 F (36.3 C) (!) 97.5 F (36.4 C)  TempSrc: Oral Oral Oral  Oral  SpO2: 97% 94% 95% 97%  Weight:      Height:        Intake/Output Summary (Last 24 hours) at 12/16/2024 1650 Last data filed at 12/16/2024 1527 Gross per 24 hour  Intake 2020 ml  Output 550 ml  Net 1470 ml   Filed Weights   12/15/24 1640 12/15/24 2154  Weight: 70.8 kg 70.8 kg     Examination:  General exam: Appears calm and comfortable  Respiratory system: Clear to auscultation. Respiratory effort normal. Cardiovascular system: S1 & S2 heard, RRR.  Gastrointestinal system: Abdomen is soft Central nervous system: Alert and awake Extremities: No edema Skin: No significant lesions noted Psychiatry: Flat affect. Foley with clear, yellow urine output    Data Reviewed: I have personally reviewed following labs and imaging studies  CBC: Recent Labs  Lab 12/15/24 1746 12/16/24 0414  WBC 6.0 6.3  NEUTROABS 3.3  --   HGB 11.1* 10.6*  HCT 35.4* 32.5*  MCV 81.9 81.5  PLT 292 238   Basic Metabolic Panel: Recent Labs  Lab 12/15/24 1746 12/16/24 0414  NA 136 137  K 4.1 3.8  CL 99 102  CO2 22 22  GLUCOSE 298* 343*  BUN 16 20  CREATININE 1.06* 0.81  CALCIUM  10.1 9.7   GFR: Estimated Creatinine Clearance: 54.8 mL/min (by C-G formula based on SCr of 0.81 mg/dL). Liver Function Tests: Recent Labs  Lab 12/15/24 1746  AST 16  ALT 13  ALKPHOS 73  BILITOT 0.3  PROT 7.7  ALBUMIN 4.2   Recent Labs  Lab 12/15/24 1746  LIPASE 24   No results for input(s): AMMONIA in the last 168 hours. Coagulation Profile: No results for input(s): INR, PROTIME in the last 168 hours. Cardiac Enzymes: No results for input(s): CKTOTAL, CKMB, CKMBINDEX, TROPONINI in the last 168 hours. BNP (last 3 results) Recent Labs    10/07/24 1114  PROBNP 949.0*   HbA1C: No results for input(s): HGBA1C in the last 72 hours. CBG: Recent Labs  Lab 12/15/24 2241 12/16/24 0748 12/16/24 1107 12/16/24 1609  GLUCAP 427* 280* 418* 280*   Lipid Profile: No results for input(s): CHOL, HDL, LDLCALC, TRIG, CHOLHDL, LDLDIRECT in the last 72 hours. Thyroid  Function Tests: No results for input(s): TSH, T4TOTAL, FREET4, T3FREE, THYROIDAB in the last 72 hours. Anemia Panel: No results for input(s): VITAMINB12, FOLATE, FERRITIN,  TIBC, IRON, RETICCTPCT in the last 72 hours. Sepsis Labs: No results for input(s): PROCALCITON, LATICACIDVEN in the last 168 hours.  Recent Results (from the past 240 hours)  Culture, blood (Routine x 2)     Status: None   Collection Time: 12/07/24 11:17 AM   Specimen: BLOOD  Result Value Ref Range Status   Specimen Description BLOOD BLOOD LEFT ARM  Final   Special Requests   Final    BOTTLES DRAWN AEROBIC AND ANAEROBIC Blood Culture adequate volume   Culture   Final    NO GROWTH 5 DAYS Performed at [facility], [address] [place], [place] [address]    Report Status 12/12/2024 FINAL  Final  Culture, blood (Routine x 2)     Status: None   Collection Time: 12/07/24 11:17 AM   Specimen: BLOOD  Result Value Ref Range Status   Specimen Description BLOOD BLOOD RIGHT ARM  Final   Special Requests   Final    BOTTLES DRAWN AEROBIC AND ANAEROBIC Blood Culture adequate volume   Culture   Final    NO GROWTH 5 DAYS Performed at  [facility], [address], [place], [place] [address]    Report Status 12/12/2024 FINAL  Final  Urine Culture     Status: Abnormal   Collection Time: 12/07/24 11:39 AM   Specimen: Urine, Catheterized  Result Value Ref Range Status   Specimen Description   Final    URINE, CATHETERIZED Performed at [facility], [address], [place], [place] [address]    Special Requests   Final    NONE Reflexed from [id number] Performed at [facility], [address], [place], [place] [address]    Culture (A)  Final    >=100,000 COLONIES/mL ENTEROCOCCUS FAECALIS VANCOMYCIN  RESISTANT ENTEROCOCCUS >=100,000 COLONIES/mL YEAST    Report Status 12/10/2024 FINAL  Final   Organism ID, Bacteria ENTEROCOCCUS FAECALIS (A)  Final      Susceptibility   Enterococcus faecalis - MIC*    AMPICILLIN  <=2 SENSITIVE Sensitive     NITROFURANTOIN  <=16 SENSITIVE Sensitive     VANCOMYCIN  >=32 RESISTANT Resistant     * >=100,000 COLONIES/mL ENTEROCOCCUS FAECALIS  Resp  panel by RT-PCR (RSV, Flu A&B, Covid) Anterior Nasal Swab     Status: None   Collection Time: 12/07/24 11:49 AM   Specimen: Anterior Nasal Swab  Result Value Ref Range Status   SARS Coronavirus 2 by RT PCR NEGATIVE NEGATIVE Final    Comment: (NOTE) SARS-CoV-2 target nucleic acids are NOT DETECTED.  The SARS-CoV-2 RNA is generally detectable in upper respiratory specimens during the acute phase of infection. The lowest concentration of SARS-CoV-2 viral copies this assay can detect is 138 copies/mL. A negative result does not preclude SARS-Cov-2 infection and should not be used as the sole basis for treatment or other patient management decisions. A negative result may occur with  improper specimen collection/handling, submission of specimen other than nasopharyngeal swab, presence of viral mutation(s) within the areas targeted by this assay, and inadequate number of viral copies(<138 copies/mL). A negative result must be combined with clinical observations, patient history, and epidemiological information. The expected result is Negative.  Fact Sheet for Patients:  bloggercourse.com  Fact Sheet for Healthcare Providers:  seriousbroker.it  This test is no t yet approved or cleared by the [place]  FDA and  has been authorized for detection and/or diagnosis of SARS-CoV-2 by FDA under an Emergency Use Authorization (EUA). This EUA will remain  in effect (meaning this test can be used) for the duration of the COVID-19 declaration under Section 564(b)(1) of the Act, 21 U.S.C.section 360bbb-3(b)(1), unless the authorization is terminated  or revoked sooner.       Influenza A by PCR NEGATIVE NEGATIVE Final   Influenza B by PCR NEGATIVE NEGATIVE Final    Comment: (NOTE) The Xpert Xpress SARS-CoV-2/FLU/RSV plus assay is intended as an aid in the diagnosis of influenza from Nasopharyngeal swab specimens and should not be used as a  sole basis for treatment. Nasal washings and aspirates are unacceptable for Xpert Xpress SARS-CoV-2/FLU/RSV testing.  Fact Sheet for Patients: bloggercourse.com  Fact Sheet for Healthcare Providers: seriousbroker.it  This test is not yet approved or cleared by the [place]  FDA and has been authorized for detection and/or diagnosis of SARS-CoV-2 by FDA under an Emergency Use Authorization (EUA). This EUA will remain in effect (meaning this test can be used) for the duration of the COVID-19 declaration under Section 564(b)(1) of the Act, 21 U.S.C. section 360bbb-3(b)(1), unless the authorization is terminated or revoked.     Resp Syncytial Virus by PCR NEGATIVE NEGATIVE Final  Comment: (NOTE) Fact Sheet for Patients: bloggercourse.com  Fact Sheet for Healthcare Providers: seriousbroker.it  This test is not yet approved or cleared by the [place]  FDA and has been authorized for detection and/or diagnosis of SARS-CoV-2 by FDA under an Emergency Use Authorization (EUA). This EUA will remain in effect (meaning this test can be used) for the duration of the COVID-19 declaration under Section 564(b)(1) of the Act, 21 U.S.C. section 360bbb-3(b)(1), unless the authorization is terminated or revoked.  Performed at [facility], [address] [place], [place] [address]          Radiology Studies: No results found.      Scheduled Meds:  ALPRAZolam   0.5 mg Oral QHS   aspirin  EC  81 mg Oral Daily   atorvastatin   80 mg Oral Daily   Chlorhexidine  Gluconate Cloth  6 each Topical Q0600   enoxaparin  (LOVENOX ) injection  40 mg Subcutaneous Q24H   gabapentin   100 mg Oral TID   insulin  aspart  0-5 Units Subcutaneous QHS   insulin  aspart  0-9 Units Subcutaneous TID WC   insulin  aspart  4 Units Subcutaneous TID WC   insulin  glargine-yfgn  25 Units Subcutaneous QHS    levothyroxine   175 mcg Oral Q0600   metoprolol  succinate  25 mg Oral Daily   pantoprazole   40 mg Oral Daily   ticagrelor   60 mg Oral BID   Continuous Infusions:  ampicillin  (OMNIPEN) IV Stopped (12/16/24 1454)     LOS: 1 day    Time spent: 55 minutes    Gaynelle Pastrana D Maree, DO [facility]  If 7PM-7AM, please contact night-coverage [email or web address] 12/16/2024, 4:50 PM   "

## 2024-12-16 NOTE — Evaluation (Signed)
 Occupational Therapy Evaluation Patient Details Name: Gabrielle Wood MRN: [id number] DOB: 09-08-1946 Today's Date: 12/16/2024   History of Present Illness   Gabrielle Wood is a 79 y.o. 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  Patient was recently seen in the emergency room here on 12/07/2024 after she presented with generalized weakness and leakage of urine around the Foley catheter.  Urinalysis around that time showed findings of UTI and urine culture was taken.  Patient was discharged home around that time on Keflex  which she has been taking with no improvement in symptoms.  Urine culture results showing VRE.  Patient came back today because she was still having persistent weakness and now with associated lower abdominal pain.  Denies fever, nausea vomiting, chest pain cough or hematuria. (per MD)     Clinical Impressions Pt agreeable to OT and PT co-evaluation. Pt reports one recent fall. CGA to min A needed for functional mobility in bed and out of bed with RW. Slow labored movement with extended time for mobility. Pt demonstrates need for set up assist for UB ADL's and set up to min A for lower body ADL's. Pt left in the chair with call bell within reach. Pt will benefit from continued OT in the hospital to increase strength, balance, and endurance for safe ADL's.        If plan is discharge home, recommend the following:   A little help with walking and/or transfers;A little help with bathing/dressing/bathroom;Assistance with cooking/housework;Assist for transportation;Help with stairs or ramp for entrance     Functional Status Assessment   Patient has had a recent decline in their  functional status and demonstrates the ability to make significant improvements in function in a reasonable and predictable amount of time.     Equipment Recommendations   Tub/shower bench (Unclear if pt has regular shower chair or tub bench, but would benefit from tub bench and was given a handout on tub bench use.)            Precautions/Restrictions   Precautions Precautions: Fall Recall of Precautions/Restrictions: Intact Restrictions Weight Bearing Restrictions Per Provider Order: No     Mobility Bed Mobility Overal bed mobility: Needs Assistance Bed Mobility: Supine to Sit     Supine to sit: Contact guard, Min assist     General bed mobility comments: increased time, HOB slightly raised, bed rail    Transfers Overall transfer level: Needs assistance Equipment used: Rolling walker (2 wheels) Transfers: Sit to/from Stand, Bed to chair/wheelchair/BSC Sit to Stand: Contact guard assist, Min assist     Step pivot transfers: Contact guard assist, Min assist     General transfer comment: Labored effor for sit to stand with RW; cuing for technique.      Balance Overall balance assessment: Needs assistance Sitting-balance support: Feet supported, No upper extremity supported Sitting balance-Leahy Scale: Fair Sitting balance - Comments: fair/good seated at EOB   Standing balance support: Reliant on assistive device for balance, During functional activity, Bilateral upper extremity supported Standing balance-Leahy Scale: Fair Standing balance comment: using RW                           ADL either performed or assessed with clinical judgement   ADL  Overall ADL's : Needs assistance/impaired     Grooming: Set up;Sitting       Lower Body Bathing: Set up;Bed level;Sitting/lateral leans       Lower Body Dressing: Modified independent;Set up;Bed level Lower Body Dressing Details (indicate cue type and reason): Able to adjust socks while in bed  prior to mobility. Toilet Transfer: Contact guard assist;Minimal assistance;Stand-pivot;Ambulation;Rolling walker (2 wheels) Toilet Transfer Details (indicate cue type and reason): EOB to chair and ambulation in the hall.         Functional mobility during ADLs: Contact guard assist;Rolling walker (2 wheels)       Vision Baseline Vision/History: 1 Wears glasses Ability to See in Adequate Light: 0 Adequate Patient Visual Report: No change from baseline Vision Assessment?: No apparent visual deficits     Perception Perception: Not tested       Praxis Praxis: Not tested       Pertinent Vitals/Pain Pain Assessment Pain Assessment: Faces Faces Pain Scale: Hurts a little bit Pain Location: back Pain Descriptors / Indicators: Discomfort Pain Intervention(s): Monitored during session, Repositioned     Extremity/Trunk Assessment Upper Extremity Assessment Upper Extremity Assessment: Generalized weakness   Lower Extremity Assessment Lower Extremity Assessment: Defer to PT evaluation   Cervical / Trunk Assessment Cervical / Trunk Assessment: Normal   Communication Communication Communication: Impaired Factors Affecting Communication: Hearing impaired   Cognition Arousal: Alert Behavior During Therapy: WFL for tasks assessed/performed Cognition: No apparent impairments                               Following commands: Intact       Cueing  General Comments   Cueing Techniques: Verbal cues;Gestural cues                 Home Living Family/patient expects to be discharged to:: Private residence Living Arrangements: Alone Available Help at Discharge: Family;Available PRN/intermittently Type of Home: House Home Access: Stairs to enter [employer] of Steps: 3 Entrance Stairs-Rails: Right;Left;Can reach both Home Layout: One level     Bathroom Shower/Tub: Chief Strategy Officer: Standard Bathroom Accessibility: Yes How  Accessible: Accessible via wheelchair;Accessible via walker Home Equipment: Rolling Walker (2 wheels);Grab bars - tub/shower;Wheelchair - manual;BSC/3in1;Shower seat   Additional Comments: Same history as recent admission.      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 ADLs Comments: Assist for bathing, dressing, toileting, and IADL's. Family and friends usually come by daily to help.    OT Problem List: Decreased strength;Decreased activity tolerance;Impaired balance (sitting and/or standing)   OT Treatment/Interventions: Self-care/ADL training;Therapeutic exercise;DME and/or AE instruction;Therapeutic activities;Balance training;Patient/family education      OT Goals(Current goals can be found in the care plan section)   Acute Rehab OT Goals Patient Stated Goal: Improve function. OT Goal Formulation: With patient Time For Goal Achievement: 12/30/24 Potential to Achieve Goals: Good   OT Frequency:  Min 2X/week    Co-evaluation PT/OT/SLP Co-Evaluation/Treatment: Yes Reason for Co-Treatment: To address functional/ADL transfers   OT goals addressed during session: ADL's and self-care                       End of Session Equipment Utilized During Treatment: Rolling walker (2  wheels)  Activity Tolerance: Patient tolerated treatment well Patient left: in chair;with call bell/phone within reach  OT Visit Diagnosis: Unsteadiness on feet (R26.81);Other abnormalities of gait and mobility (R26.89);Muscle weakness (generalized) (M62.81);History of falling (Z91.81)                Time: 8960-8895 OT Time Calculation (min): 25 min Charges:  OT General Charges $OT Visit: 1 Visit OT  Evaluation $OT Eval Low Complexity: 1 Low  Jamarl Pew OT, MOT  Jayson Person 12/16/2024, 12:43 PM

## 2024-12-16 NOTE — Plan of Care (Signed)
" °  Problem: Acute Rehab OT Goals (only OT should resolve) Goal: Pt. Will Perform Grooming Flowsheets (Taken 12/16/2024 1245) Pt Will Perform Grooming: with modified independence Goal: Pt. Will Perform Lower Body Bathing Flowsheets (Taken 12/16/2024 1245) Pt Will Perform Lower Body Bathing: with modified independence Goal: Pt. Will Perform Lower Body Dressing Flowsheets (Taken 12/16/2024 1245) Pt Will Perform Lower Body Dressing: with modified independence Goal: Pt. Will Transfer To Toilet Flowsheets (Taken 12/16/2024 1245) Pt Will Transfer to Toilet:  with modified independence  ambulating Goal: Pt/Caregiver Will Perform Home Exercise Program Flowsheets (Taken 12/16/2024 1245) Pt/caregiver will Perform Home Exercise Program:  Increased strength  Both right and left upper extremity  Independently  Saryah Loper OT, MOT  "

## 2024-12-17 DIAGNOSIS — N3000 Acute cystitis without hematuria: Secondary | ICD-10-CM | POA: Diagnosis not present

## 2024-12-17 LAB — GLUCOSE, CAPILLARY
Glucose-Capillary: 240 mg/dL — ABNORMAL HIGH (ref 70–99)
Glucose-Capillary: 242 mg/dL — ABNORMAL HIGH (ref 70–99)

## 2024-12-17 LAB — URINE CULTURE

## 2024-12-17 MED ORDER — AMOXICILLIN 500 MG PO CAPS: 500.0000 mg | ORAL_CAPSULE | Freq: Three times a day (TID) | ORAL | 0 refills | Status: AC

## 2024-12-17 MED ORDER — FLUCONAZOLE 200 MG PO TABS: 200.0000 mg | ORAL_TABLET | Freq: Every day | ORAL | 0 refills | Status: AC

## 2024-12-17 NOTE — TOC Transition Note (Signed)
 Transition of Care [facility]) - Discharge Note   Patient Details  Name: Gabrielle Wood MRN: [id number] Date of Birth: [birth year]  Transition of Care [facility]) CM/SW Contact:  [name], LCSW Phone Number: 12/17/2024, 10:35 AM   Clinical Narrative:    Pt to return home with son. Pt to continue HHPT/OT w/ [facility]. HH orders are in place. No further ICM needs identified.   Final next level of care: Home w Home Health Services Barriers to Discharge: Barriers Resolved   Patient Goals and CMS Choice Patient states their goals for this hospitalization and ongoing recovery are:: To return home CMS Medicare.gov Compare Post Acute Care list provided to:: Patient Choice offered to / list presented to : Patient      Discharge Placement                       Discharge Plan and Services Additional resources added to the After Visit Summary for   In-house Referral: Clinical Social Work Discharge Planning Services: NA Post Acute Care Choice: Home Health, Resumption of Svcs/PTA Provider          DME Arranged: N/A DME Agency: NA       HH Arranged: OT, PT HH Agency: [facility] Date [facility] Agency Contacted: 12/16/24   Representative spoke with at [facility] Agency: Dorothe  Social Drivers of Health (SDOH) Interventions SDOH Screenings   Food Insecurity: No Food Insecurity (12/16/2024)  Housing: Low Risk (12/16/2024)  Transportation Needs: No Transportation Needs (12/16/2024)  Utilities: Not At Risk (12/16/2024)  Financial Resource Strain: Low Risk (08/11/2024)   Received from [facility]  Physical Activity: Inactive (08/11/2024)   Received from [facility]  Social Connections: Unknown (12/16/2024)  Recent Concern: Social Connections - Socially Isolated (10/07/2024)  Stress: No Stress Concern Present (08/11/2024)   Received from [facility]  Tobacco Use: Medium Risk (12/15/2024)  Health Literacy: Medium Risk (08/11/2024)   Received from [facility]     Readmission  Risk Interventions    12/16/2024   10:11 AM 10/14/2024   12:40 PM 10/11/2024   10:33 AM  Readmission Risk Prevention Plan  Transportation Screening Complete Complete Complete  PCP or Specialist Appt within 3-5 Days Complete    Home Care Screening   Complete  Medication Review (RN CM)   Complete  HRI or Home Care Consult Complete Complete   Social Work Consult for Recovery Care Planning/Counseling Complete Complete   Palliative Care Screening Not Applicable Not Applicable   Medication Review Oceanographer) Complete Complete

## 2024-12-17 NOTE — Care Management CC44 (Signed)
"         Condition Code 44 Documentation Completed  Patient Details  Name: Gabrielle Wood MRN: [id number] Date of Birth: 10/24/1946   Condition Code 44 given:  Yes Patient signature on Condition Code 44 notice:  Yes Documentation of 2 MD's agreement:  Yes Code 44 added to claim:  Yes    [name], LCSW 12/17/2024, 10:16 AM  "

## 2024-12-17 NOTE — Care Management Obs Status (Signed)
 MEDICARE OBSERVATION STATUS NOTIFICATION   Patient Details  Name: Gabrielle Wood MRN: [id number] Date of Birth: 07/25/1946   Medicare Observation Status Notification Given:  Yes    [name], LCSW 12/17/2024, 10:16 AM

## 2024-12-17 NOTE — Discharge Summary (Signed)
 Physician Discharge Summary  Gabrielle Wood FMW:[id number] DOB: December 27, 1945 DOA: 12/15/2024  PCP: [name], MD  Admit date: 12/15/2024  Discharge date: 12/17/2024  Admitted From:Home  Disposition:  Home  Recommendations for Outpatient Follow-up:  Follow up with PCP in 1-2 weeks Amoxcillin TID for 5 more days to complete 7 day course for VRE UTI Diflucan  200mg  for 7 days for yeast in UCx Foley catheter exchanged 1/13 Continue other home medications as prior  Home Health:Yes with OT/PT  Equipment/Devices:None  Discharge Condition:Stable  CODE STATUS: DNR  Diet recommendation: Heart Healthy/Carb Modified  Brief/Interim Summary: Gabrielle Wood is a 79 y.o. 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  Patient was recently seen in the emergency room here on 12/07/2024 after she presented with generalized weakness and leakage of urine around the Foley catheter.  Urinalysis around that time showed findings of UTI and urine culture was taken.  Patient was discharged home around that time on Keflex  which she has been taking with no improvement in symptoms.  Urine culture results showing VRE.  Patient came back today because she was still having persistent weakness and now with associated lower abdominal pain.  She was admitted for treatment of her complicated UTI secondary to VRE and started on ampicillin .  She is also noted to have yeast in her urine cultures.   Discharge Diagnoses:  Principal Problem:   Urinary tract infection  Principal discharge diagnosis: Complicated UTI in setting of Foley with VRE and yeast.  Discharge Instructions  Discharge Instructions     Increase activity  slowly   Complete by: As directed       Allergies as of 12/17/2024       Reactions   Amlodipine  Other (See Comments)   Unknown    Ciprocin-fluocin-procin [fluocinolone] Other (See Comments)   Nightmares   Lipitor [atorvastatin ] Other (See Comments)   Muscle aches, pain   Nebivolol Other (See Comments)   Unknown    Nitrofuran Derivatives Other (See Comments)   Unknown    Lisinopril Cough   Red Yeast Rice [cholestin] Itching, Rash        Medication List     STOP taking these medications    cephALEXin  500 MG capsule Commonly known as: KEFLEX        TAKE these medications    acetaminophen  500 MG tablet Commonly known as: TYLENOL  Take 1,000 mg by mouth in the morning and at bedtime.   albuterol  108 (90 Base) MCG/ACT inhaler Commonly known as: VENTOLIN  HFA Inhale 1-2 puffs into the lungs every 4 (four) hours as needed for wheezing or shortness of breath.   ALPRAZolam  1 MG tablet Commonly known as: XANAX  Take 0.5 tablets (0.5 mg total) by mouth at bedtime. 0.5 tablet in am and 0.5 tab at night   amoxicillin  500 MG capsule Commonly known as: AMOXIL  Take 1 capsule (500 mg total) by mouth 3 (three) times daily for 5 days.   aspirin  EC 81 MG tablet Take 1 tablet (81 mg total) by mouth daily. Swallow whole.   atorvastatin  80 MG tablet Commonly known as: LIPITOR Take 80 mg by mouth daily.   baclofen  10 MG tablet Commonly known as: LIORESAL  Take 10 mg by mouth 2 (two) times daily as needed for muscle spasms.   Brilinta   60 MG Tabs tablet Generic drug: ticagrelor  Take 60 mg by mouth 2 (two) times daily.   estradiol  0.01 % Crea vaginal cream Commonly known as: ESTRACE  Apply a pea-sized amount to vaginal opening using index finger 2-3 nights/week   fluconazole  200 MG tablet Commonly known as: Diflucan  Take 1 tablet (200 mg total) by mouth daily for 7 days.   FreeStyle Libre 3 Plus Sensor Misc Change sensor every 15 days.   gabapentin  100 MG capsule Commonly  known as: NEURONTIN  Take 1 capsule (100 mg total) by mouth 3 (three) times daily.   insulin  lispro 100 UNIT/ML KwikPen Commonly known as: HumaLOG  KwikPen Inject 8-14 Units into the skin 3 (three) times daily.   levothyroxine  175 MCG tablet Commonly known as: SYNTHROID  Take 175 mcg by mouth daily.   magnesium  oxide 400 (240 Mg) MG tablet Commonly known as: MAG-OX Take 400 mg by mouth daily.   megestrol  400 MG/10ML suspension Commonly known as: MEGACE  Take 10 mLs (400 mg total) by mouth daily.   metoprolol  succinate 25 MG 24 hr tablet Commonly known as: TOPROL -XL Take 25 mg by mouth daily.   Myrbetriq  25 MG Tb24 tablet Generic drug: mirabegron  ER Take 25 mg by mouth daily.   nitroGLYCERIN  0.4 MG SL tablet Commonly known as: NITROSTAT  Place 0.4 mg under the tongue every 5 (five) minutes as needed for chest pain.   OXYGEN  Inhale 2 L/min into the lungs at bedtime.   pantoprazole  40 MG tablet Commonly known as: PROTONIX  Take 40 mg by mouth daily.   senna-docusate 8.6-50 MG tablet Commonly known as: Senokot-S Take 1 tablet by mouth at bedtime as needed for mild constipation.   Trelegy Ellipta 100-62.5-25 MCG/ACT Aepb Generic drug: Fluticasone-Umeclidin-Vilant Inhale 1 puff into the lungs daily.   Tresiba  FlexTouch 100 UNIT/ML FlexTouch Pen Generic drug: insulin  degludec Inject 26 Units into the skin at bedtime.   valsartan 40 MG tablet Commonly known as: DIOVAN Take 40 mg by mouth daily.        Follow-up Information     Vyas, Dhruv B, MD. Schedule an appointment as soon as possible for a visit in 1 week(s).   Specialty: Internal Medicine Contact information: [address] [place] [address] [phone]                Allergies[1]  Consultations: None   Procedures/Studies: DG Chest Port 1 View Result Date: 12/07/2024 EXAM: 1 VIEW(S) XRAY OF THE CHEST 12/07/2024 11:59:00 AM COMPARISON: 10/13/2024 CLINICAL HISTORY: sob FINDINGS: LUNGS AND PLEURA:  Hazy opacity along left lateral costophrenic angle likely represents atelectasis and/or small pleural effusion. No pneumothorax. HEART AND MEDIASTINUM: Atherosclerotic plaque. No acute abnormality of the cardiac and mediastinal silhouettes. BONES AND SOFT TISSUES: No acute osseous abnormality. IMPRESSION: 1. Hazy opacity along left lateral costophrenic angle, likely representing atelectasis and/or small pleural effusion. 2. Atherosclerotic plaque. Electronically signed by: Evalene Coho MD 12/07/2024 12:57 PM EST RP Workstation: [id number]   NM Renal Imaging Flow W/Pharm Result Date: 11/27/2024 EXAM: NUCLEAR MEDICINE RENAL SCAN 11/18/2024 11:44:46 AM TECHNIQUE: RADIOPHARMACEUTICAL: 5.5 mCi Tc-69m MAG3 40 mg of lasix  was administered approximately 20 minutes into the examination. Dynamic flow and standard sequential images of the kidneys were acquired in posterior projection. Renograms of kidneys were generated. COMPARISON: None available. CLINICAL HISTORY: Hydronephrosis. FINDINGS: LEFT: Prompt perfusion is present. Prompt uptake and normal concentration into normal caliber collecting systems. Spontaneous excretion and augmentation to Lasix  is present. RIGHT: Decreased arterial flow to the right kidney compared to the left.  Decreased accumulation in the right renal cortex compared to the left. There is pooling of counts within the right renal pelvis. Lasix  augments some clearance of counts in the right renal pelvis. There is a moderate postvoid residual on the right. Obstructive hydronephrosis of the right kidney. Split renal function is 72% on the left and 28% on the right. T1/2 left kidney 5.9 minutes, right kidney 24 minutes. Asymmetric renal differential as above. Normal left kidney. IMPRESSION: 1. Obstructive hydronephrosis of the right kidney. 2. Reduced right renal function with differential renal function of 28% on the right and 72% on the left. 3. Normal function of the left kidney. Electronically  signed by: Norleen Boxer MD 11/27/2024 10:06 AM EST RP Workstation: [id number]     Discharge Exam: Vitals:   12/17/24 0431 12/17/24 0852  BP: (!) 153/74 (!) 143/80  Pulse: 82 95  Resp: 18   Temp: 98.7 F (37.1 C)   SpO2: 94%    Vitals:   12/16/24 1258 12/16/24 1959 12/17/24 0431 12/17/24 0852  BP: 117/65 (!) 153/84 (!) 153/74 (!) 143/80  Pulse: 73 86 82 95  Resp: 18 17 18    Temp: (!) 97.5 F (36.4 C) 98 F (36.7 C) 98.7 F (37.1 C)   TempSrc: Oral Oral Oral   SpO2: 97% 95% 94%   Weight:      Height:        General: Pt is alert, awake, not in acute distress Cardiovascular: RRR, S1/S2 +, no rubs, no gallops Respiratory: CTA bilaterally, no wheezing, no rhonchi Abdominal: Soft, NT, ND, bowel sounds + Extremities: no edema, no cyanosis Foley with clear yellow UO    The results of significant diagnostics from this hospitalization (including imaging, microbiology, ancillary and laboratory) are listed below for reference.     Microbiology: Recent Results (from the past 240 hours)  Culture, blood (Routine x 2)     Status: None   Collection Time: 12/07/24 11:17 AM   Specimen: BLOOD  Result Value Ref Range Status   Specimen Description BLOOD BLOOD LEFT ARM  Final   Special Requests   Final    BOTTLES DRAWN AEROBIC AND ANAEROBIC Blood Culture adequate volume   Culture   Final    NO GROWTH 5 DAYS Performed at [facility], [address] [place], [place] [address]    Report Status 12/12/2024 FINAL  Final  Culture, blood (Routine x 2)     Status: None   Collection Time: 12/07/24 11:17 AM   Specimen: BLOOD  Result Value Ref Range Status   Specimen Description BLOOD BLOOD RIGHT ARM  Final   Special Requests   Final    BOTTLES DRAWN AEROBIC AND ANAEROBIC Blood Culture adequate volume   Culture   Final    NO GROWTH 5 DAYS Performed at [facility], [address] [place], [place] [address]    Report Status 12/12/2024 FINAL  Final  Urine Culture     Status: Abnormal    Collection Time: 12/07/24 11:39 AM   Specimen: Urine, Catheterized  Result Value Ref Range Status   Specimen Description   Final    URINE, CATHETERIZED Performed at [facility], [address] [place], [place] [address]    Special Requests   Final    NONE Reflexed from [id number] Performed at [facility], [address] [place], [place] [address]    Culture (A)  Final    >=100,000 COLONIES/mL ENTEROCOCCUS FAECALIS VANCOMYCIN  RESISTANT ENTEROCOCCUS >=100,000 COLONIES/mL YEAST    Report  Status 12/10/2024 FINAL  Final   Organism ID, Bacteria ENTEROCOCCUS FAECALIS (A)  Final      Susceptibility   Enterococcus faecalis - MIC*    AMPICILLIN  <=2 SENSITIVE Sensitive     NITROFURANTOIN  <=16 SENSITIVE Sensitive     VANCOMYCIN  >=32 RESISTANT Resistant     * >=100,000 COLONIES/mL ENTEROCOCCUS FAECALIS  Resp panel by RT-PCR (RSV, Flu A&B, Covid) Anterior Nasal Swab     Status: None   Collection Time: 12/07/24 11:49 AM   Specimen: Anterior Nasal Swab  Result Value Ref Range Status   SARS Coronavirus 2 by RT PCR NEGATIVE NEGATIVE Final    Comment: (NOTE) SARS-CoV-2 target nucleic acids are NOT DETECTED.  The SARS-CoV-2 RNA is generally detectable in upper respiratory specimens during the acute phase of infection. The lowest concentration of SARS-CoV-2 viral copies this assay can detect is 138 copies/mL. A negative result does not preclude SARS-Cov-2 infection and should not be used as the sole basis for treatment or other patient management decisions. A negative result may occur with  improper specimen collection/handling, submission of specimen other than nasopharyngeal swab, presence of viral mutation(s) within the areas targeted by this assay, and inadequate number of viral copies(<138 copies/mL). A negative result must be combined with clinical observations, patient history, and epidemiological information. The expected result is Negative.  Fact Sheet for Patients:   bloggercourse.com  Fact Sheet for Healthcare Providers:  seriousbroker.it  This test is no t yet approved or cleared by the [place]  FDA and  has been authorized for detection and/or diagnosis of SARS-CoV-2 by FDA under an Emergency Use Authorization (EUA). This EUA will remain  in effect (meaning this test can be used) for the duration of the COVID-19 declaration under Section 564(b)(1) of the Act, 21 U.S.C.section 360bbb-3(b)(1), unless the authorization is terminated  or revoked sooner.       Influenza A by PCR NEGATIVE NEGATIVE Final   Influenza B by PCR NEGATIVE NEGATIVE Final    Comment: (NOTE) The Xpert Xpress SARS-CoV-2/FLU/RSV plus assay is intended as an aid in the diagnosis of influenza from Nasopharyngeal swab specimens and should not be used as a sole basis for treatment. Nasal washings and aspirates are unacceptable for Xpert Xpress SARS-CoV-2/FLU/RSV testing.  Fact Sheet for Patients: bloggercourse.com  Fact Sheet for Healthcare Providers: seriousbroker.it  This test is not yet approved or cleared by the [place]  FDA and has been authorized for detection and/or diagnosis of SARS-CoV-2 by FDA under an Emergency Use Authorization (EUA). This EUA will remain in effect (meaning this test can be used) for the duration of the COVID-19 declaration under Section 564(b)(1) of the Act, 21 U.S.C. section 360bbb-3(b)(1), unless the authorization is terminated or revoked.     Resp Syncytial Virus by PCR NEGATIVE NEGATIVE Final    Comment: (NOTE) Fact Sheet for Patients: bloggercourse.com  Fact Sheet for Healthcare Providers: seriousbroker.it  This test is not yet approved or cleared by the [place]  FDA and has been authorized for detection and/or diagnosis of SARS-CoV-2 by FDA under an Emergency Use  Authorization (EUA). This EUA will remain in effect (meaning this test can be used) for the duration of the COVID-19 declaration under Section 564(b)(1) of the Act, 21 U.S.C. section 360bbb-3(b)(1), unless the authorization is terminated or revoked.  Performed at [facility], [address] [place], [place] [address]   Urine Culture     Status: Abnormal   Collection Time: 12/15/24  7:50 PM   Specimen: Urine, Catheterized  Result Value Ref Range Status   Specimen Description   Final    URINE, CATHETERIZED Performed at [facility], [address], [place], [place] [address]    Special Requests   Final    NONE Performed at [facility], [address], [place], [place] [address]    Culture MULTIPLE SPECIES PRESENT, SUGGEST RECOLLECTION (A)  Final   Report Status 12/17/2024 FINAL  Final     Labs: BNP (last 3 results) No results for input(s): BNP in the last 8760 hours. Basic Metabolic Panel: Recent Labs  Lab 12/15/24 1746 12/16/24 0414  NA 136 137  K 4.1 3.8  CL 99 102  CO2 22 22  GLUCOSE 298* 343*  BUN 16 20  CREATININE 1.06* 0.81  CALCIUM  10.1 9.7   Liver Function Tests: Recent Labs  Lab 12/15/24 1746  AST 16  ALT 13  ALKPHOS 73  BILITOT 0.3  PROT 7.7  ALBUMIN 4.2   Recent Labs  Lab 12/15/24 1746  LIPASE 24   No results for input(s): AMMONIA in the last 168 hours. CBC: Recent Labs  Lab 12/15/24 1746 12/16/24 0414  WBC 6.0 6.3  NEUTROABS 3.3  --   HGB 11.1* 10.6*  HCT 35.4* 32.5*  MCV 81.9 81.5  PLT 292 238   Cardiac Enzymes: No results for input(s): CKTOTAL, CKMB, CKMBINDEX, TROPONINI in the last 168 hours. BNP: Invalid input(s): POCBNP CBG: Recent Labs  Lab 12/16/24 0748 12/16/24 1107 12/16/24 1609 12/16/24 1922 12/17/24 0717  GLUCAP 280* 418* 280* 224* 240*   D-Dimer No results for input(s): DDIMER in the last 72 hours. Hgb A1c No results for input(s): HGBA1C in the last 72 hours. Lipid Profile No results for  input(s): CHOL, HDL, LDLCALC, TRIG, CHOLHDL, LDLDIRECT in the last 72 hours. Thyroid  function studies No results for input(s): TSH, T4TOTAL, T3FREE, THYROIDAB in the last 72 hours.  Invalid input(s): FREET3 Anemia work up No results for input(s): VITAMINB12, FOLATE, FERRITIN, TIBC, IRON, RETICCTPCT in the last 72 hours. Urinalysis    Component Value Date/Time   COLORURINE YELLOW 12/15/2024 1950   APPEARANCEUR HAZY (A) 12/15/2024 1950   APPEARANCEUR Clear 10/02/2021 1400   LABSPEC 1.015 12/15/2024 1950   PHURINE 6.0 12/15/2024 1950   GLUCOSEU >=500 (A) 12/15/2024 1950   HGBUR SMALL (A) 12/15/2024 1950   BILIRUBINUR NEGATIVE 12/15/2024 1950   BILIRUBINUR Negative 10/02/2021 1400   KETONESUR NEGATIVE 12/15/2024 1950   PROTEINUR 30 (A) 12/15/2024 1950   UROBILINOGEN 0.2 04/03/2012 2144   NITRITE NEGATIVE 12/15/2024 1950   LEUKOCYTESUR LARGE (A) 12/15/2024 1950   Sepsis Labs Recent Labs  Lab 12/15/24 1746 12/16/24 0414  WBC 6.0 6.3   Microbiology Recent Results (from the past 240 hours)  Culture, blood (Routine x 2)     Status: None   Collection Time: 12/07/24 11:17 AM   Specimen: BLOOD  Result Value Ref Range Status   Specimen Description BLOOD BLOOD LEFT ARM  Final   Special Requests   Final    BOTTLES DRAWN AEROBIC AND ANAEROBIC Blood Culture adequate volume   Culture   Final    NO GROWTH 5 DAYS Performed at [facility], [address], [place], [place] [address]    Report Status 12/12/2024 FINAL  Final  Culture, blood (Routine x 2)     Status: None   Collection Time: 12/07/24 11:17 AM   Specimen: BLOOD  Result Value Ref Range Status   Specimen Description BLOOD BLOOD RIGHT ARM  Final   Special Requests  Final    BOTTLES DRAWN AEROBIC AND ANAEROBIC Blood Culture adequate volume   Culture   Final    NO GROWTH 5 DAYS Performed at [facility], [address] [place], [place] [address]    Report Status 12/12/2024 FINAL  Final   Urine Culture     Status: Abnormal   Collection Time: 12/07/24 11:39 AM   Specimen: Urine, Catheterized  Result Value Ref Range Status   Specimen Description   Final    URINE, CATHETERIZED Performed at [facility], [address], [place], [place] [address]    Special Requests   Final    NONE Reflexed from [id number] Performed at [facility], [address], [place], [place] [address]    Culture (A)  Final    >=100,000 COLONIES/mL ENTEROCOCCUS FAECALIS VANCOMYCIN  RESISTANT ENTEROCOCCUS >=100,000 COLONIES/mL YEAST    Report Status 12/10/2024 FINAL  Final   Organism ID, Bacteria ENTEROCOCCUS FAECALIS (A)  Final      Susceptibility   Enterococcus faecalis - MIC*    AMPICILLIN  <=2 SENSITIVE Sensitive     NITROFURANTOIN  <=16 SENSITIVE Sensitive     VANCOMYCIN  >=32 RESISTANT Resistant     * >=100,000 COLONIES/mL ENTEROCOCCUS FAECALIS  Resp panel by RT-PCR (RSV, Flu A&B, Covid) Anterior Nasal Swab     Status: None   Collection Time: 12/07/24 11:49 AM   Specimen: Anterior Nasal Swab  Result Value Ref Range Status   SARS Coronavirus 2 by RT PCR NEGATIVE NEGATIVE Final    Comment: (NOTE) SARS-CoV-2 target nucleic acids are NOT DETECTED.  The SARS-CoV-2 RNA is generally detectable in upper respiratory specimens during the acute phase of infection. The lowest concentration of SARS-CoV-2 viral copies this assay can detect is 138 copies/mL. A negative result does not preclude SARS-Cov-2 infection and should not be used as the sole basis for treatment or other patient management decisions. A negative result may occur with  improper specimen collection/handling, submission of specimen other than nasopharyngeal swab, presence of viral mutation(s) within the areas targeted by this assay, and inadequate number of viral copies(<138 copies/mL). A negative result must be combined with clinical observations, patient history, and epidemiological information. The expected result is  Negative.  Fact Sheet for Patients:  bloggercourse.com  Fact Sheet for Healthcare Providers:  seriousbroker.it  This test is no t yet approved or cleared by the [place]  FDA and  has been authorized for detection and/or diagnosis of SARS-CoV-2 by FDA under an Emergency Use Authorization (EUA). This EUA will remain  in effect (meaning this test can be used) for the duration of the COVID-19 declaration under Section 564(b)(1) of the Act, 21 U.S.C.section 360bbb-3(b)(1), unless the authorization is terminated  or revoked sooner.       Influenza A by PCR NEGATIVE NEGATIVE Final   Influenza B by PCR NEGATIVE NEGATIVE Final    Comment: (NOTE) The Xpert Xpress SARS-CoV-2/FLU/RSV plus assay is intended as an aid in the diagnosis of influenza from Nasopharyngeal swab specimens and should not be used as a sole basis for treatment. Nasal washings and aspirates are unacceptable for Xpert Xpress SARS-CoV-2/FLU/RSV testing.  Fact Sheet for Patients: bloggercourse.com  Fact Sheet for Healthcare Providers: seriousbroker.it  This test is not yet approved or cleared by the [place]  FDA and has been authorized for detection and/or diagnosis of SARS-CoV-2 by FDA under an Emergency Use Authorization (EUA). This EUA will remain in effect (meaning this test can be used) for the duration of the COVID-19 declaration under Section  564(b)(1) of the Act, 21 U.S.C. section 360bbb-3(b)(1), unless the authorization is terminated or revoked.     Resp Syncytial Virus by PCR NEGATIVE NEGATIVE Final    Comment: (NOTE) Fact Sheet for Patients: bloggercourse.com  Fact Sheet for Healthcare Providers: seriousbroker.it  This test is not yet approved or cleared by the [place]  FDA and has been authorized for detection and/or diagnosis of  SARS-CoV-2 by FDA under an Emergency Use Authorization (EUA). This EUA will remain in effect (meaning this test can be used) for the duration of the COVID-19 declaration under Section 564(b)(1) of the Act, 21 U.S.C. section 360bbb-3(b)(1), unless the authorization is terminated or revoked.  Performed at [facility], [address], [place], [place] [address]   Urine Culture     Status: Abnormal   Collection Time: 12/15/24  7:50 PM   Specimen: Urine, Catheterized  Result Value Ref Range Status   Specimen Description   Final    URINE, CATHETERIZED Performed at [facility], [address] [place], [place] [address]    Special Requests   Final    NONE Performed at [facility], [address], [place], [place] [address]    Culture MULTIPLE SPECIES PRESENT, SUGGEST RECOLLECTION (A)  Final   Report Status 12/17/2024 FINAL  Final     Time coordinating discharge: 35 minutes  SIGNED:   Adron JONETTA Fairly, DO [facility] 12/17/2024, 9:47 AM  If 7PM-7AM, please contact night-coverage [email or web address]     [1]  Allergies Allergen Reactions   Amlodipine  Other (See Comments)    Unknown    Ciprocin-Fluocin-Procin [Fluocinolone] Other (See Comments)    Nightmares   Lipitor [Atorvastatin ] Other (See Comments)    Muscle aches, pain   Nebivolol Other (See Comments)    Unknown    Nitrofuran Derivatives Other (See Comments)    Unknown    Lisinopril Cough   Red Yeast Rice [Cholestin] Itching and Rash

## 2024-12-17 NOTE — Inpatient Diabetes Management (Signed)
 Inpatient Diabetes Program Recommendations  AACE/ADA: New Consensus Statement on Inpatient Glycemic Control   Target Ranges:  Prepandial:   less than 140 mg/dL      Peak postprandial:   less than 180 mg/dL (1-2 hours)      Critically ill patients:  140 - 180 mg/dL    Latest Reference Range & Units 12/16/24 07:48 12/16/24 11:07 12/16/24 16:09 12/16/24 19:22 12/17/24 07:17  Glucose-Capillary 70 - 99 mg/dL 719 (H) 581 (H) 719 (H) 224 (H) 240 (H)    Review of Glycemic Control  Diabetes history: DM2 Outpatient Diabetes medications: Tresiba  26 units at bedtime, Humalog  8-14 units TID with meals Current orders for Inpatient glycemic control: Semglee  25 units at bedtime, Novolog  0-9 units TID with meals, Novolog  0-5 units at bedtime, Novolog  4 units TID with meals   Inpatient Diabetes Program Recommendations:     Insulin : CBG 240 mg/dl this morning. Please consider increasing Semglee  to 28 units at bedtime.  Thanks, Earnie Gainer, RN, MSN, CDCES Diabetes Coordinator Inpatient Diabetes Program [phone] (Team Pager from 8am to 5pm)

## 2024-12-23 ENCOUNTER — Telehealth (HOSPITAL_BASED_OUTPATIENT_CLINIC_OR_DEPARTMENT_OTHER): Payer: Self-pay

## 2024-12-23 ENCOUNTER — Encounter: Payer: Self-pay | Admitting: Nurse Practitioner

## 2024-12-23 ENCOUNTER — Ambulatory Visit: Admitting: Nurse Practitioner

## 2024-12-23 VITALS — BP 98/60 | HR 62 | Ht 67.0 in | Wt 154.8 lb

## 2024-12-23 DIAGNOSIS — Z7984 Long term (current) use of oral hypoglycemic drugs: Secondary | ICD-10-CM | POA: Diagnosis not present

## 2024-12-23 DIAGNOSIS — Z794 Long term (current) use of insulin: Secondary | ICD-10-CM | POA: Diagnosis not present

## 2024-12-23 DIAGNOSIS — E039 Hypothyroidism, unspecified: Secondary | ICD-10-CM | POA: Diagnosis not present

## 2024-12-23 DIAGNOSIS — E11649 Type 2 diabetes mellitus with hypoglycemia without coma: Secondary | ICD-10-CM

## 2024-12-23 DIAGNOSIS — E559 Vitamin D deficiency, unspecified: Secondary | ICD-10-CM

## 2024-12-23 LAB — COMPREHENSIVE METABOLIC PANEL WITH GFR
ALT: 13 IU/L (ref 0–32)
AST: 17 IU/L (ref 0–40)
Albumin: 4.3 g/dL (ref 3.8–4.8)
Alkaline Phosphatase: 80 IU/L (ref 49–135)
BUN/Creatinine Ratio: 19 (ref 12–28)
BUN: 19 mg/dL (ref 8–27)
Bilirubin Total: 0.3 mg/dL (ref 0.0–1.2)
CO2: 22 mmol/L (ref 20–29)
Calcium: 10.2 mg/dL (ref 8.7–10.3)
Chloride: 102 mmol/L (ref 96–106)
Creatinine, Ser: 0.99 mg/dL (ref 0.57–1.00)
Globulin, Total: 3.2 g/dL (ref 1.5–4.5)
Glucose: 136 mg/dL — ABNORMAL HIGH (ref 70–99)
Potassium: 4.8 mmol/L (ref 3.5–5.2)
Sodium: 138 mmol/L (ref 134–144)
Total Protein: 7.5 g/dL (ref 6.0–8.5)
eGFR: 58 mL/min/1.73 — ABNORMAL LOW

## 2024-12-23 LAB — TSH: TSH: 0.108 u[IU]/mL — ABNORMAL LOW (ref 0.450–4.500)

## 2024-12-23 LAB — T4, FREE: Free T4: 2.42 ng/dL — ABNORMAL HIGH (ref 0.82–1.77)

## 2024-12-23 MED ORDER — LEVOTHYROXINE SODIUM 112 MCG PO TABS: 112.0000 ug | ORAL_TABLET | Freq: Every day | ORAL | 1 refills | Status: AC

## 2024-12-23 NOTE — Progress Notes (Signed)
 "                                                                        Endocrinology Follow Up Note       12/23/2024, 4:33 PM   Subjective:    Patient ID: Gabrielle Wood, female    DOB: 08/06/1946.  Gabrielle Wood is being seen in follow up after being seen in consultation for management of currently uncontrolled symptomatic diabetes requested by  [name], MD.   Past Medical History:  Diagnosis Date   Age-related physical debility 11/17/2021   AKI (acute kidney injury) 09/29/2024   Anxiety 05/28/2023   Arthritis    CAP (community acquired pneumonia) 10/14/2021   Chronic hypoxic respiratory failure (HCC) 06/23/2024   Constipation 06/26/2024   COPD (chronic obstructive pulmonary disease) (HCC)    COPD exacerbation (HCC) 05/27/2024   Diabetes mellitus    DKA (diabetic ketoacidosis) (HCC) 10/14/2021   DVT (deep venous thrombosis) (HCC)    years ago   Failure to thrive in adult 10/07/2024   Gastritis and duodenitis 04/05/2012   GERD (gastroesophageal reflux disease)    H. 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  Pathology with chronic H. pylori gastritis.   EUS   05/14/2012   Normal pancreas, gallbladder,biliary tree/ Incidental finding of simple-appearing right renal cyst.   TONSILLECTOMY     TUBAL LIGATION     VEIN SURGERY     stripping years ago but recent laser surgery; bilateral legs    Social History   Socioeconomic History   Marital status: Widowed    Spouse name: Not on file   Number of children: 2   Years of education: Not on file   Highest education level: Not on file  Occupational History   Occupation: retired, advertising copywriter at [employer]  Tobacco Use   Smoking status: Former    Current packs/day: 1.00    Average packs/day: 1 pack/day for 55.2 years (55.2 ttl pk-yrs)    Types: Cigarettes    Start date: 10/19/1969   Smokeless tobacco: Never   Tobacco comments:    Quit in 2017  Vaping Use   Vaping status: Never Used  Substance and Sexual Activity   Alcohol  use: No   Drug use: No   Sexual activity: Not on file  Other Topics Concern   Not on file  Social History Narrative  Not on file   Social Drivers of Health   Tobacco Use: Medium Risk (12/23/2024)   Patient History    Smoking Tobacco Use: Former    Smokeless Tobacco Use: Never    Passive Exposure: Not on file  Financial Resource Strain: Low Risk (08/11/2024)   Received from [facility]   Overall Financial Resource Strain (CARDIA)    How hard is it for you to pay for the very basics like food, housing, medical care, and heating?: Not hard at all  Food Insecurity: No Food Insecurity (12/16/2024)   Epic    Worried About Programme Researcher, Broadcasting/film/video in the Last Year: Never true    Ran Out of Food in the Last Year: Never true  Transportation Needs: No Transportation Needs (12/16/2024)   Epic    Lack of Transportation (Medical): No    Lack of Transportation (Non-Medical): No  Physical Activity: Inactive (08/11/2024)   Received from [facility]   Exercise Vital Sign    On average, how many days per week do you engage in moderate to strenuous exercise (like a brisk walk)?: 0 days     On average, how many minutes do you engage in exercise at this level?: 0 min  Stress: No Stress Concern Present (08/11/2024)   Received from [facility] - Occupational Stress Questionnaire    Do you feel stress - tense, restless, nervous, or anxious, or unable to sleep at night because your mind is troubled all the time - these days?: Only a little  Social Connections: Unknown (12/16/2024)   Social Connection and Isolation Panel    Frequency of Communication with Friends and Family: More than three times a week    Frequency of Social Gatherings with Friends and Family: More than three times a week    Attends Religious Services: 1 to 4 times per year    Active Member of [employer] or Organizations: No    Attends Banker Meetings: Never    Marital Status: Patient declined  Recent Concern: Social Connections - Socially Isolated (10/07/2024)   Social Connection and Isolation Panel    Frequency of Communication with Friends and Family: Three times a week    Frequency of Social Gatherings with Friends and Family: Three times a week    Attends Religious Services: Never    Active Member of Clubs or Organizations: No    Attends Banker Meetings: Never    Marital Status: Widowed  Depression (PHQ2-9): Not on file  Alcohol  Screen: Not on file  Housing: Low Risk (12/16/2024)   Epic    Unable to Pay for Housing in the Last Year: No    Number of Times Moved in the Last Year: 0    Homeless in the Last Year: No  Utilities: Not At Risk (12/16/2024)   Epic    Threatened with loss of utilities: No  Health Literacy: Medium Risk (08/11/2024)   Received from [facility]    How often do you need to have someone help you when you read instructions, pamphlets, or other written material from your doctor or pharmacy?: Sometimes    Family History  Problem Relation Age of Onset   Pancreatic cancer Mother        diagnosed  at age 80s, died from heart attack   Inflammatory bowel disease Brother        crohn's, his dgt deceased from colon cancer  at age 28   Colon cancer Neg Hx     Outpatient Encounter Medications as of 12/23/2024  Medication Sig   acetaminophen  (TYLENOL ) 500 MG tablet Take 1,000 mg by mouth in the morning and at bedtime.   albuterol  (VENTOLIN  HFA) 108 (90 Base) MCG/ACT inhaler Inhale 1-2 puffs into the lungs every 4 (four) hours as needed for wheezing or shortness of breath.   ALPRAZolam  (XANAX ) 1 MG tablet Take 0.5 tablets (0.5 mg total) by mouth at bedtime. 0.5 tablet in am and 0.5 tab at night   aspirin  EC 81 MG tablet Take 1 tablet (81 mg total) by mouth daily. Swallow whole.   atorvastatin  (LIPITOR) 80 MG tablet Take 80 mg by mouth daily.   baclofen  (LIORESAL ) 10 MG tablet Take 10 mg by mouth 2 (two) times daily as needed for muscle spasms.   BRILINTA  60 MG TABS tablet Take 60 mg by mouth 2 (two) times daily.   Continuous Glucose Sensor (FREESTYLE LIBRE 3 PLUS SENSOR) MISC Change sensor every 15 days.   estradiol  (ESTRACE ) 0.01 % CREA vaginal cream Apply a pea-sized amount to vaginal opening using index finger 2-3 nights/week   fluconazole  (DIFLUCAN ) 200 MG tablet Take 1 tablet (200 mg total) by mouth daily for 7 days.   gabapentin  (NEURONTIN ) 100 MG capsule Take 1 capsule (100 mg total) by mouth 3 (three) times daily.   insulin  degludec (TRESIBA  FLEXTOUCH) 100 UNIT/ML FlexTouch Pen Inject 26 Units into the skin at bedtime.   insulin  lispro (HUMALOG  KWIKPEN) 100 UNIT/ML KwikPen Inject 8-14 Units into the skin 3 (three) times daily. (Patient taking differently: Inject 8-14 Units into the skin 3 (three) times daily. Patient's caregiver states that the patient PCP increased it to 15 units three times a day before meals)   magnesium  oxide (MAG-OX) 400 (240 Mg) MG tablet Take 400 mg by mouth daily.   metoprolol  succinate (TOPROL -XL) 25 MG 24 hr tablet Take 25 mg by mouth daily.   MYRBETRIQ  25 MG  TB24 tablet Take 25 mg by mouth daily.   nitroGLYCERIN  (NITROSTAT ) 0.4 MG SL tablet Place 0.4 mg under the tongue every 5 (five) minutes as needed for chest pain.   OXYGEN  Inhale 2 L/min into the lungs at bedtime.   pantoprazole  (PROTONIX ) 40 MG tablet Take 40 mg by mouth daily.   senna-docusate (SENOKOT-S) 8.6-50 MG tablet Take 1 tablet by mouth at bedtime as needed for mild constipation.   TRELEGY ELLIPTA 100-62.5-25 MCG/ACT AEPB Inhale 1 puff into the lungs daily.   valsartan (DIOVAN) 40 MG tablet Take 40 mg by mouth daily.   [DISCONTINUED] levothyroxine  (SYNTHROID ) 175 MCG tablet Take 175 mcg by mouth daily.   [EXPIRED] amoxicillin  (AMOXIL ) 500 MG capsule Take 1 capsule (500 mg total) by mouth 3 (three) times daily for 5 days.   levothyroxine  (SYNTHROID ) 112 MCG tablet Take 1 tablet (112 mcg total) by mouth daily before breakfast.   megestrol  (MEGACE ) 400 MG/10ML suspension Take 10 mLs (400 mg total) by mouth daily. (Patient not taking: Reported on 12/23/2024)   [DISCONTINUED] fexofenadine (ALLEGRA) 60 MG tablet Take by mouth.   No facility-administered encounter medications on file as of 12/23/2024.    ALLERGIES: Allergies  Allergen Reactions   Amlodipine  Other (See Comments)    Unknown    Ciprocin-Fluocin-Procin [Fluocinolone] Other (See Comments)    Nightmares   Lipitor [Atorvastatin ] Other (See Comments)    Muscle aches, pain   Nebivolol Other (See Comments)    Unknown    Nitrofuran  Derivatives Other (See Comments)    Unknown    Lisinopril Cough   Red Yeast Rice [Cholestin] Itching and Rash    VACCINATION STATUS: Immunization History  Administered Date(s) Administered   Fluad Quad(high Dose 65+) 09/02/2024   Pneumococcal Conjugate-13 08/15/2019, 08/15/2019   Pneumococcal Polysaccharide-23 04/04/2012    Diabetes She presents for her follow-up diabetic visit. She has type 2 diabetes mellitus. Onset time: Was diagnosed at approx age of 38. Her disease course has been  fluctuating. There are no hypoglycemic associated symptoms. Associated symptoms include fatigue and foot paresthesias. Pertinent negatives for diabetes include no polydipsia, no polyuria and no weight loss. There are no hypoglycemic complications. Symptoms are improving. Diabetic complications include heart disease and peripheral neuropathy. (Had a MI in the past, recently hospitalized for DKA) Risk factors for coronary artery disease include diabetes mellitus, dyslipidemia, hypertension, post-menopausal and sedentary lifestyle. Current diabetic treatment includes intensive insulin  program. She is compliant with treatment most of the time. Her weight is fluctuating minimally. She is following a generally healthy diet. When asked about meal planning, she reported none. She has not had a previous visit with a dietitian (Has appt with Santana after me today). She participates in exercise intermittently. Her home blood glucose trend is fluctuating dramatically. Her overall blood glucose range is >200 mg/dl. (She presents today, accompanied by family, with her CGM showing dramatically fluctuating glycemic profile.  Her most recent A1c on 11/12 was 10.5%, increasing from last A1c of 9.7%.  She has been hospitalized several times since last visit for pneumonia and sepsis from UTI.  Analysis of her CGM shows TIR 19%, TAR 80%, TBR 1%, with a GMI of 9.8%.  She does have steep drops in glucose after meal time injections.  ) An ACE inhibitor/angiotensin II receptor blocker is being taken. She does not see a podiatrist.Eye exam is current.     Review of systems  Constitutional: + stable body weight,  current Body mass index is 24.25 kg/m. , + fatigue, no subjective hyperthermia, no subjective hypothermia Eyes: no blurry vision, no xerophthalmia ENT: no sore throat, no nodules palpated in throat, no dysphagia/odynophagia, no hoarseness Cardiovascular: no chest pain, no shortness of breath, no palpitations, no leg  swelling Respiratory: no cough, no shortness of breath Gastrointestinal: no nausea/vomiting Genitourinary: + urinary catheter in place Musculoskeletal: + generalized weakness-using walker Skin: no rashes, no hyperemia Neurological: no tremors, + numbness/tingling to BLE, no dizziness Psychiatric: no depression, no anxiety  Objective:     BP 98/60 (BP Location: Right Arm, Patient Position: Sitting, Cuff Size: Large)   Pulse 62 Comment: apical  Ht 5' 7 (1.702 m)   Wt 154 lb 12.8 oz (70.2 kg)   BMI 24.25 kg/m   Wt Readings from Last 3 Encounters:  12/23/24 154 lb 12.8 oz (70.2 kg)  12/15/24 156 lb 1.4 oz (70.8 kg)  12/07/24 156 lb (70.8 kg)     BP Readings from Last 3 Encounters:  12/23/24 98/60  12/17/24 (!) 143/80  12/07/24 118/72      Physical Exam- Limited  Constitutional:  Body mass index is 24.25 kg/m. , not in acute distress, normal state of mind, frail appearance, falling asleep during visit Eyes:  EOMI, no exophthalmos Neck: Supple Musculoskeletal: no gross deformities, strength intact in all four extremities, no gross restriction of joint movements, walks with cane/walker Skin:  no rashes, no hyperemia Neurological: no tremor with outstretched hands   Diabetic Foot Exam - Simple   No data filed  CMP ( most recent) CMP     Component Value Date/Time   NA 138 12/22/2024 1450   K 4.8 12/22/2024 1450   CL 102 12/22/2024 1450   CO2 22 12/22/2024 1450   GLUCOSE 136 (H) 12/22/2024 1450   GLUCOSE 343 (H) 12/16/2024 0414   BUN 19 12/22/2024 1450   CREATININE 0.99 12/22/2024 1450   CREATININE 0.78 09/20/2016 0923   CALCIUM  10.2 12/22/2024 1450   PROT 7.5 12/22/2024 1450   ALBUMIN 4.3 12/22/2024 1450   AST 17 12/22/2024 1450   ALT 13 12/22/2024 1450   ALKPHOS 80 12/22/2024 1450   BILITOT 0.3 12/22/2024 1450   GFRNONAA >60 12/16/2024 0414   GFRNONAA 83 07/10/2023 1107   GFRNONAA 77 09/20/2016 0923   GFRAA >60 08/21/2019 1539   GFRAA 88 09/20/2016  0923     Diabetic Labs (most recent): Lab Results  Component Value Date   HGBA1C 10.5 (H) 10/14/2024   HGBA1C 9.7 (H) 08/16/2024   HGBA1C 10.7 10/21/2023   MICROALBUR 150 02/21/2021      Lab Results  Component Value Date   TSH 0.108 (L) 12/22/2024   TSH 8.15 (A) 04/13/2024   TSH 5.30 10/21/2023   TSH 1.68 01/30/2022   TSH 1.62 07/24/2021   TSH 11.00 (A) 04/25/2021   TSH 19.240 (H) 07/17/2017   TSH 1.91 09/20/2016   TSH 3.05 06/14/2016   TSH 2.86 03/12/2016   FREET4 2.42 (H) 12/22/2024   FREET4 1.53 07/17/2017   FREET4 1.5 09/20/2016   FREET4 1.6 06/14/2016   FREET4 1.5 03/12/2016     Assessment & Plan:   1) Uncontrolled type 2 diabetes mellitus with circulatory disorder, with long-term current use of insulin  (HCC)  - Gabrielle Wood has currently uncontrolled symptomatic type 2 DM since 79 years of age.  She presents today, accompanied by family, with her CGM showing dramatically fluctuating glycemic profile.  Her most recent A1c on 11/12 was 10.5%, increasing from last A1c of 9.7%.  She has been hospitalized several times since last visit for pneumonia and sepsis from UTI.  Analysis of her CGM shows TIR 19%, TAR 80%, TBR 1%, with a GMI of 9.8%.  She does have steep drops in glucose after meal time injections.    -Recent labs reviewed.  - I had a long discussion with her about the progressive nature of diabetes and the pathology behind its complications. 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 These are all discussed in detail with her.  - Nutritional counseling repeated/built upon at each appointment.  - The patient admits there is a room for improvement in their diet and drink choices. -  Suggestion is made for the patient to avoid simple carbohydrates from their diet including Cakes, Sweet Desserts / Pastries,  Ice Cream, Soda (diet and regular), Sweet Tea, Candies, Chips, Cookies, Sweet Pastries, Store Bought Juices, Alcohol  in Excess of 1-2 drinks a day, Artificial Sweeteners, Coffee Creamer, and Sugar-free Products. This will help patient to have stable blood glucose profile and potentially avoid unintended weight gain.   - I encouraged the patient to switch to unprocessed or minimally processed complex starch and increased protein intake (animal or plant source), fruits, and vegetables.   - Patient is advised to stick to a routine mealtimes to eat 3 meals a day and avoid unnecessary snacks (to snack only to  correct hypoglycemia).  - I have approached her with the following individualized plan to manage  her diabetes and patient agrees:   -She is advised to continue her Tresiba  26 units SQ nightly and adjust Humalog  8-14 units (instead of flat 15 units before each meal) if glucose is above 90 and she is eating (Specific instructions on how to titrate insulin  dosage based on glucose readings given to patient in writing).  I want her to start taking it 10-15 minutes before eating her meal to see if that helps prevent the sudden spike we have been seeing.  -she is encouraged to continue monitoring blood glucose 4 times daily (using her CGM), before meals and before bed, and to call the clinic if she has readings less than 70 or greater than 300 for 3 tests in a row.    - she is warned not to take insulin  without proper monitoring per orders. - Adjustment parameters are given to her for hypo and hyperglycemia in writing.  -She is not a candidate for incretin therapy due to body habitus.  - Specific targets for  A1c;  LDL, HDL,  and Triglycerides were discussed with the patient.  2) Blood Pressure /Hypertension: Her BP is controlled to target.   she is advised to continue her current medication as prescribed by PCP.  3)  Weight/Diet:  her Body mass index is 24.25 kg/m.  -  she is not a candidate for  weight loss.  Exercise, and detailed carbohydrates information provided  -  detailed on discharge instructions.  4) Hypothyroidism- unspecified Her previsit TFTs are consistent with over-replacement, however she notes her Levothyroxine  dose was adjusted during one of her hospital visits and increased to 175 mcg.  Will lower her dose of Levothyroxine  to 112 mcg po daily before breakfast, closer to her weight based maximum dose.   I advised her and caregiver today to notify me if another provider changes her thyroid  meds.   Will recheck more thyroid  labs prior to next visit and continue to adjust dose accordingly.   - The correct intake of thyroid  hormone (Levothyroxine , Synthroid ), is on empty stomach first thing in the morning, with water , separated by at least 30 minutes from breakfast and other medications,  and separated by more than 4 hours from calcium , iron, multivitamins, acid reflux medications (PPIs).  - This medication is a life-long medication and will be needed to correct thyroid  hormone imbalances for the rest of your life.  The dose may change from time to time, based on thyroid  blood work.  - It is extremely important to be consistent taking this medication, near the same time each morning.  -AVOID TAKING PRODUCTS CONTAINING BIOTIN (commonly found in Hair, Skin, Nails vitamins) AS IT INTERFERES WITH THE VALIDITY OF THYROID  FUNCTION BLOOD TESTS.  5) Chronic Care/Health Maintenance: -she is on ACEI/ARB and is encouraged to initiate and continue to follow up with Ophthalmology, Dentist, Podiatrist at least yearly or according to recommendations, and advised to stay away from smoking. I have recommended yearly flu vaccine and pneumonia vaccine at least every 5 years; moderate intensity exercise for up to 150 minutes weekly; and sleep for at least 7 hours a day.  - she is advised to maintain close follow up with [name], MD for primary care needs, as well as her other providers for  optimal and coordinated care.     I spent  48  minutes in the care of the patient today including review of labs  from CMP, Lipids, Thyroid  Function, Hematology (current and previous including abstractions from other facilities); face-to-face time discussing  her blood glucose readings/logs, discussing hypoglycemia and hyperglycemia episodes and symptoms, medications doses, her options of short and long term treatment based on the latest standards of care / guidelines;  discussion about incorporating lifestyle medicine;  and documenting the encounter. Risk reduction counseling performed per USPSTF guidelines to reduce obesity and cardiovascular risk factors.     Please refer to Patient Instructions for Blood Glucose Monitoring and Insulin /Medications Dosing Guide  in media tab for additional information. Please  also refer to  Patient Self Inventory in the Media  tab for reviewed elements of pertinent patient history.  Gabrielle Wood participated in the discussions, expressed understanding, and voiced agreement with the above plans.  All questions were answered to her satisfaction. she is encouraged to contact clinic should she have any questions or concerns prior to her return visit.   Follow up plan: - Return in about 8 weeks (around 02/17/2025) for Diabetes F/U with A1c in office, Thyroid  follow up, Previsit labs, Bring meter and logs.  Benton Rio, [facility] [address] [place], [place] [address] Phone: [phone] Fax: [phone]  12/23/2024, 4:33 PM    "

## 2024-12-23 NOTE — Telephone Encounter (Signed)
 Post ED Visit - Positive Culture Follow-up: Successful Patient Follow-Up  Culture assessed and recommendations reviewed by:  []  Rankin Dee, Pharm.D. []  Venetia Gully, Pharm.D., BCPS AQ-ID []  Garrel Crews, Pharm.D., BCPS []  Almarie Lunger, Pharm.D., BCPS []  Downing, [place].D., BCPS, AAHIVP []  Rosaline Bihari, Pharm.D., BCPS, AAHIVP []  Vernell Meier, PharmD, BCPS []  Latanya Hint, PharmD, BCPS []  Donald Medley, PharmD, BCPS []  Rocky Bold, PharmD  Positive urine culture  []  Patient discharged without antimicrobial prescription and treatment is now indicated [x]  Organism is resistant to prescribed ED discharge antimicrobial []  Patient with positive blood cultures  Plan: Stop Cephalexin  and start Amoxicillin  800/160 mg po BID x 3 days. Per ED provider Elsie Body, MD  Pt family member called back and stated pt has already been seen and started on Amoxicillin .   Contacted patient, date 12/23/24, time 11:40 am   Gabrielle Wood 12/23/2024, 11:45 AM

## 2024-12-25 ENCOUNTER — Telehealth: Payer: Self-pay

## 2024-12-25 NOTE — Telephone Encounter (Signed)
 Spoke with pt's sone to rescheduled appointment due to weather. Voiced understanding

## 2024-12-25 NOTE — Telephone Encounter (Signed)
 Tried calling pt/son with no answer. Unable to LVM mailbox not setup.

## 2024-12-29 ENCOUNTER — Ambulatory Visit

## 2024-12-30 ENCOUNTER — Encounter (HOSPITAL_COMMUNITY): Payer: Self-pay

## 2024-12-30 ENCOUNTER — Encounter (HOSPITAL_COMMUNITY)
Admission: RE | Admit: 2024-12-30 | Discharge: 2024-12-30 | Disposition: A | Discharge status: Home / Self Care | Source: Ambulatory Visit | Attending: Urology | Admitting: Urology

## 2025-01-01 ENCOUNTER — Ambulatory Visit

## 2025-01-04 ENCOUNTER — Ambulatory Visit (HOSPITAL_COMMUNITY): Admission: RE | Admit: 2025-01-04 | Discharge: 2025-01-04 | Disposition: A | Attending: Urology | Admitting: Urology

## 2025-01-04 ENCOUNTER — Ambulatory Visit (HOSPITAL_COMMUNITY)

## 2025-01-04 ENCOUNTER — Encounter (HOSPITAL_COMMUNITY)
Admission: RE | Disposition: A | Discharge status: Home / Self Care | Payer: Self-pay | Source: Home / Self Care | Attending: Urology

## 2025-01-04 ENCOUNTER — Encounter (HOSPITAL_COMMUNITY): Payer: Self-pay | Admitting: Urology

## 2025-01-04 ENCOUNTER — Ambulatory Visit (HOSPITAL_COMMUNITY): Admitting: Anesthesiology

## 2025-01-04 DIAGNOSIS — K2950 Unspecified chronic gastritis without bleeding: Secondary | ICD-10-CM | POA: Insufficient documentation

## 2025-01-04 DIAGNOSIS — J449 Chronic obstructive pulmonary disease, unspecified: Secondary | ICD-10-CM | POA: Insufficient documentation

## 2025-01-04 DIAGNOSIS — I10 Essential (primary) hypertension: Secondary | ICD-10-CM | POA: Insufficient documentation

## 2025-01-04 DIAGNOSIS — N135 Crossing vessel and stricture of ureter without hydronephrosis: Secondary | ICD-10-CM | POA: Diagnosis not present

## 2025-01-04 DIAGNOSIS — E119 Type 2 diabetes mellitus without complications: Secondary | ICD-10-CM | POA: Insufficient documentation

## 2025-01-04 DIAGNOSIS — K219 Gastro-esophageal reflux disease without esophagitis: Secondary | ICD-10-CM | POA: Insufficient documentation

## 2025-01-04 DIAGNOSIS — I252 Old myocardial infarction: Secondary | ICD-10-CM | POA: Insufficient documentation

## 2025-01-04 DIAGNOSIS — E039 Hypothyroidism, unspecified: Secondary | ICD-10-CM | POA: Insufficient documentation

## 2025-01-04 DIAGNOSIS — Z87891 Personal history of nicotine dependence: Secondary | ICD-10-CM | POA: Insufficient documentation

## 2025-01-04 LAB — GLUCOSE, CAPILLARY
Glucose-Capillary: 393 mg/dL — ABNORMAL HIGH (ref 70–99)
Glucose-Capillary: 367 mg/dL — ABNORMAL HIGH (ref 70–99)
Glucose-Capillary: 237 mg/dL — ABNORMAL HIGH (ref 70–99)

## 2025-01-04 MED ORDER — SODIUM CHLORIDE 0.9 % IR SOLN
Status: DC | PRN
  Administered 2025-01-04: 3000 mL

## 2025-01-04 MED ORDER — FENTANYL CITRATE (PF) 100 MCG/2ML IJ SOLN
INTRAMUSCULAR | Status: AC
  Filled 2025-01-04: qty 2

## 2025-01-04 MED ORDER — MIDAZOLAM HCL 5 MG/5ML IJ SOLN
INTRAMUSCULAR | Status: DC | PRN
  Administered 2025-01-04: 2 mg via INTRAVENOUS

## 2025-01-04 MED ORDER — CHLORHEXIDINE GLUCONATE 0.12 % MT SOLN
15.0000 mL | Freq: Once | OROMUCOSAL | Status: AC
  Administered 2025-01-04: 15 mL via OROMUCOSAL

## 2025-01-04 MED ORDER — GLYCOPYRROLATE PF 0.2 MG/ML IJ SOSY
PREFILLED_SYRINGE | INTRAMUSCULAR | Status: AC
  Filled 2025-01-04: qty 1

## 2025-01-04 MED ORDER — CEFAZOLIN SODIUM-DEXTROSE 2-4 GM/100ML-% IV SOLN
INTRAVENOUS | Status: AC
  Filled 2025-01-04: qty 100

## 2025-01-04 MED ORDER — INSULIN ASPART 100 UNIT/ML IJ SOLN
10.0000 [IU] | Freq: Once | INTRAMUSCULAR | Status: AC
  Administered 2025-01-04: 10 [IU] via SUBCUTANEOUS

## 2025-01-04 MED ORDER — LACTATED RINGERS IV SOLN: INTRAVENOUS | Status: DC | PRN

## 2025-01-04 MED ORDER — PHENYLEPHRINE 80 MCG/ML (10ML) SYRINGE FOR IV PUSH (FOR BLOOD PRESSURE SUPPORT)
PREFILLED_SYRINGE | INTRAVENOUS | Status: DC | PRN
  Administered 2025-01-04: 80 ug via INTRAVENOUS

## 2025-01-04 MED ORDER — LACTATED RINGERS IV SOLN: INTRAVENOUS | Status: DC

## 2025-01-04 MED ORDER — DIATRIZOATE MEGLUMINE 30 % UR SOLN
URETHRAL | Status: AC
  Filled 2025-01-04: qty 100

## 2025-01-04 MED ORDER — TRAMADOL HCL 50 MG PO TABS: 50.0000 mg | ORAL_TABLET | Freq: Four times a day (QID) | ORAL | 0 refills | Status: AC | PRN

## 2025-01-04 MED ORDER — ONDANSETRON HCL 4 MG/2ML IJ SOLN
INTRAMUSCULAR | Status: DC | PRN
  Administered 2025-01-04: 4 mg via INTRAVENOUS

## 2025-01-04 MED ORDER — LIDOCAINE 2% (20 MG/ML) 5 ML SYRINGE
INTRAMUSCULAR | Status: DC | PRN
  Administered 2025-01-04: 80 mg via INTRAVENOUS

## 2025-01-04 MED ORDER — PROPOFOL 10 MG/ML IV BOLUS
INTRAVENOUS | Status: AC
  Filled 2025-01-04: qty 20

## 2025-01-04 MED ORDER — CEFAZOLIN SODIUM-DEXTROSE 2-4 GM/100ML-% IV SOLN
2.0000 g | INTRAVENOUS | Status: AC
  Administered 2025-01-04: 2 g via INTRAVENOUS

## 2025-01-04 MED ORDER — GLYCOPYRROLATE PF 0.2 MG/ML IJ SOSY
PREFILLED_SYRINGE | INTRAMUSCULAR | Status: DC | PRN
  Administered 2025-01-04: .2 mg via INTRAVENOUS

## 2025-01-04 MED ORDER — DIATRIZOATE MEGLUMINE 30 % UR SOLN
URETHRAL | Status: DC | PRN
  Administered 2025-01-04: 9 mL via URETHRAL

## 2025-01-04 MED ORDER — FENTANYL CITRATE (PF) 250 MCG/5ML IJ SOLN
INTRAMUSCULAR | Status: DC | PRN
  Administered 2025-01-04 (×2): 50 ug via INTRAVENOUS

## 2025-01-04 MED ORDER — LIDOCAINE 2% (20 MG/ML) 5 ML SYRINGE
INTRAMUSCULAR | Status: AC
  Filled 2025-01-04: qty 5

## 2025-01-04 MED ORDER — ONDANSETRON HCL 4 MG/2ML IJ SOLN
INTRAMUSCULAR | Status: AC
  Filled 2025-01-04: qty 2

## 2025-01-04 MED ORDER — STERILE WATER FOR IRRIGATION IR SOLN
Status: DC | PRN
  Administered 2025-01-04: 1000 mL

## 2025-01-04 MED ORDER — MIDAZOLAM HCL 2 MG/2ML IJ SOLN
INTRAMUSCULAR | Status: AC
  Filled 2025-01-04: qty 2

## 2025-01-04 MED ORDER — INSULIN ASPART 100 UNIT/ML IJ SOLN
10.0000 [IU] | Freq: Once | INTRAMUSCULAR | Status: DC
  Filled 2025-01-04: qty 0.1

## 2025-01-04 MED ORDER — INSULIN ASPART 100 UNIT/ML IJ SOLN
INTRAMUSCULAR | Status: AC
  Filled 2025-01-04: qty 1

## 2025-01-04 MED ORDER — PROPOFOL 10 MG/ML IV BOLUS
INTRAVENOUS | Status: DC | PRN
  Administered 2025-01-04: 80 mg via INTRAVENOUS

## 2025-01-04 NOTE — Anesthesia Preprocedure Evaluation (Signed)
"                                    Anesthesia Evaluation  Patient identified by MRN, date of birth, ID band Patient awake    Reviewed: Allergy & Precautions, H&P , NPO status , Patient's Chart, lab work & pertinent test results, reviewed documented beta blocker date and time   Airway Mallampati: II  TM Distance: >3 FB Neck ROM: full    Dental no notable dental hx.    Pulmonary pneumonia, COPD, former smoker   Pulmonary exam normal breath sounds clear to auscultation       Cardiovascular Exercise Tolerance: Good hypertension, + Past MI and + DOE   Rhythm:regular Rate:Normal     Neuro/Psych   Anxiety      Neuromuscular disease  negative psych ROS   GI/Hepatic Neg liver ROS,GERD  ,,  Endo/Other  diabetesHypothyroidism    Renal/GU Renal disease  negative genitourinary   Musculoskeletal   Abdominal   Peds  Hematology  (+) Blood dyscrasia, anemia   Anesthesia Other Findings   Reproductive/Obstetrics negative OB ROS                              Anesthesia Physical Anesthesia Plan  ASA: 3  Anesthesia Plan: MAC   Post-op Pain Management:    Induction:   PONV Risk Score and Plan: Propofol  infusion  Airway Management Planned:   Additional Equipment:   Intra-op Plan:   Post-operative Plan:   Informed Consent: I have reviewed the patients History and Physical, chart, labs and discussed the procedure including the risks, benefits and alternatives for the proposed anesthesia with the patient or authorized representative who has indicated his/her understanding and acceptance.     Dental Advisory Given  Plan Discussed with: CRNA  Anesthesia Plan Comments:         Anesthesia Quick Evaluation  "

## 2025-01-04 NOTE — Op Note (Signed)
.  Preoperative diagnosis: right UPJ obstruction  Postoperative diagnosis: Same  Procedure: 1 cystoscopy 2. right retrograde pyelography 3. Intraoperative fluoroscopy, under one hour, with interpretation 4. right 6 x 26 JJ stent placement  Attending: Belvie Clara  Anesthesia: General  Estimated blood loss: None  Drains: Right 6 x 26 JJ ureteral stent without tether  Specimens: none  Antibiotics: ancef   Findings: right UPJ obstruction. Moderate to severe hydronephrosis. No masses/lesions in the bladder. Ureteral orifices in normal anatomic location.  Indications: Patient is a 79 year old female with a history of symptomatic right UPJ obstruction.  After discussing treatment options, they decided proceed with right stent placement.  Procedure in detail: The patient was brought to the operating room and a brief timeout was done to ensure correct patient, correct procedure, correct site.  General anesthesia was administered patient was placed in dorsal lithotomy position.  Their genitalia was then prepped and draped in usual sterile fashion.  A rigid 22 French cystoscope was passed in the urethra and the bladder.  Bladder was inspected free masses or lesions.  the ureteral orifices were in the normal orthotopic locations.  a 6 french ureteral catheter was then instilled into the right ureteral orifice.  a gentle retrograde was obtained and findings noted above.  we then placed a zip wire through the ureteral catheter and advanced up to the renal pelvis.    We then placed a 6 x 26 double-j ureteral stent over the original zip wire.  We then removed the wire and good coil was noted in the the renal pelvis under fluoroscopy and the bladder under direct vision. the bladder was then drained and this concluded the procedure which was well tolerated by patient.  Complications: None  Condition: Stable, extubated, transferred to PACU  Plan: Patient is to be discharged home and followup in 2  weeks

## 2025-01-05 ENCOUNTER — Encounter (HOSPITAL_COMMUNITY): Payer: Self-pay | Admitting: Urology

## 2025-01-20 ENCOUNTER — Ambulatory Visit: Admitting: Urology

## 2025-02-22 ENCOUNTER — Ambulatory Visit: Admitting: Nurse Practitioner
